# Patient Record
Sex: Female | Born: 1945 | Race: Black or African American | Hispanic: No | Smoking: Former smoker
Health system: Southern US, Community
[De-identification: ages and names within clinical notes are randomized; demographics above are authoritative.]

## PROBLEM LIST (undated history)

## (undated) DIAGNOSIS — J449 Chronic obstructive pulmonary disease, unspecified: Secondary | ICD-10-CM

## (undated) DIAGNOSIS — J984 Other disorders of lung: Secondary | ICD-10-CM

## (undated) DIAGNOSIS — H8110 Benign paroxysmal vertigo, unspecified ear: Secondary | ICD-10-CM

## (undated) DIAGNOSIS — M1990 Unspecified osteoarthritis, unspecified site: Secondary | ICD-10-CM

## (undated) DIAGNOSIS — C801 Malignant (primary) neoplasm, unspecified: Secondary | ICD-10-CM

## (undated) DIAGNOSIS — F419 Anxiety disorder, unspecified: Secondary | ICD-10-CM

## (undated) DIAGNOSIS — K589 Irritable bowel syndrome without diarrhea: Secondary | ICD-10-CM

## (undated) DIAGNOSIS — E785 Hyperlipidemia, unspecified: Secondary | ICD-10-CM

## (undated) DIAGNOSIS — R931 Abnormal findings on diagnostic imaging of heart and coronary circulation: Secondary | ICD-10-CM

## (undated) DIAGNOSIS — E119 Type 2 diabetes mellitus without complications: Secondary | ICD-10-CM

## (undated) DIAGNOSIS — F32A Depression, unspecified: Secondary | ICD-10-CM

## (undated) DIAGNOSIS — E66813 Obesity, class 3: Secondary | ICD-10-CM

## (undated) DIAGNOSIS — G4733 Obstructive sleep apnea (adult) (pediatric): Secondary | ICD-10-CM

## (undated) DIAGNOSIS — K219 Gastro-esophageal reflux disease without esophagitis: Secondary | ICD-10-CM

## (undated) DIAGNOSIS — F329 Major depressive disorder, single episode, unspecified: Secondary | ICD-10-CM

## (undated) DIAGNOSIS — I10 Essential (primary) hypertension: Secondary | ICD-10-CM

## (undated) DIAGNOSIS — H269 Unspecified cataract: Secondary | ICD-10-CM

## (undated) HISTORY — DX: Irritable bowel syndrome, unspecified: K58.9

## (undated) HISTORY — PX: PARTIAL HYSTERECTOMY: SHX80

## (undated) HISTORY — PX: ABDOMINAL HYSTERECTOMY: SHX81

## (undated) HISTORY — DX: Other disorders of lung: J98.4

## (undated) HISTORY — DX: Obstructive sleep apnea (adult) (pediatric): G47.33

## (undated) HISTORY — DX: Anxiety disorder, unspecified: F41.9

## (undated) HISTORY — DX: Obesity, class 3: E66.813

## (undated) HISTORY — DX: Hyperlipidemia, unspecified: E78.5

## (undated) HISTORY — DX: Unspecified cataract: H26.9

## (undated) HISTORY — PX: ROTATOR CUFF REPAIR: SHX139

## (undated) HISTORY — DX: Major depressive disorder, single episode, unspecified: F32.9

## (undated) HISTORY — PX: TONSILLECTOMY: SHX5217

## (undated) HISTORY — DX: Abnormal findings on diagnostic imaging of heart and coronary circulation: R93.1

## (undated) HISTORY — DX: Depression, unspecified: F32.A

## (undated) HISTORY — DX: Essential (primary) hypertension: I10

## (undated) HISTORY — DX: Morbid (severe) obesity due to excess calories: E66.01

## (undated) HISTORY — DX: Benign paroxysmal vertigo, unspecified ear: H81.10

## (undated) HISTORY — PX: LIVER BIOPSY: SHX301

## (undated) HISTORY — DX: Unspecified osteoarthritis, unspecified site: M19.90

## (undated) HISTORY — DX: Chronic obstructive pulmonary disease, unspecified: J44.9

---

## 1998-01-05 ENCOUNTER — Encounter: Admission: RE | Admit: 1998-01-05 | Discharge: 1998-01-05 | Payer: Self-pay | Admitting: Family Medicine

## 1998-01-19 ENCOUNTER — Encounter: Admission: RE | Admit: 1998-01-19 | Discharge: 1998-01-19 | Payer: Self-pay | Admitting: Family Medicine

## 1998-06-28 ENCOUNTER — Encounter: Admission: RE | Admit: 1998-06-28 | Discharge: 1998-06-28 | Payer: Self-pay | Admitting: Family Medicine

## 1998-07-08 ENCOUNTER — Encounter (INDEPENDENT_AMBULATORY_CARE_PROVIDER_SITE_OTHER): Payer: Self-pay | Admitting: *Deleted

## 1998-07-08 LAB — CONVERTED CEMR LAB

## 1998-07-17 ENCOUNTER — Encounter: Admission: RE | Admit: 1998-07-17 | Discharge: 1998-07-17 | Payer: Self-pay | Admitting: Family Medicine

## 1998-08-16 ENCOUNTER — Encounter: Admission: RE | Admit: 1998-08-16 | Discharge: 1998-08-16 | Payer: Self-pay | Admitting: Family Medicine

## 1998-08-17 ENCOUNTER — Encounter: Admission: RE | Admit: 1998-08-17 | Discharge: 1998-08-17 | Payer: Self-pay | Admitting: Family Medicine

## 1998-08-22 ENCOUNTER — Encounter: Admission: RE | Admit: 1998-08-22 | Discharge: 1998-11-20 | Payer: Self-pay | Admitting: *Deleted

## 1998-09-18 ENCOUNTER — Encounter: Admission: RE | Admit: 1998-09-18 | Discharge: 1998-09-18 | Payer: Self-pay | Admitting: Family Medicine

## 1998-09-25 ENCOUNTER — Encounter: Admission: RE | Admit: 1998-09-25 | Discharge: 1998-09-25 | Payer: Self-pay | Admitting: Family Medicine

## 1998-10-30 ENCOUNTER — Encounter: Admission: RE | Admit: 1998-10-30 | Discharge: 1998-10-30 | Payer: Self-pay | Admitting: Family Medicine

## 1998-11-07 ENCOUNTER — Encounter: Admission: RE | Admit: 1998-11-07 | Discharge: 1998-11-07 | Payer: Self-pay | Admitting: Family Medicine

## 1999-04-24 ENCOUNTER — Encounter: Admission: RE | Admit: 1999-04-24 | Discharge: 1999-04-24 | Payer: Self-pay | Admitting: Family Medicine

## 2000-01-10 ENCOUNTER — Encounter: Admission: RE | Admit: 2000-01-10 | Discharge: 2000-01-10 | Payer: Self-pay | Admitting: Family Medicine

## 2000-05-22 ENCOUNTER — Encounter: Admission: RE | Admit: 2000-05-22 | Discharge: 2000-05-22 | Payer: Self-pay | Admitting: Family Medicine

## 2000-06-26 ENCOUNTER — Encounter: Payer: Self-pay | Admitting: Sports Medicine

## 2000-06-26 ENCOUNTER — Encounter: Admission: RE | Admit: 2000-06-26 | Discharge: 2000-06-26 | Payer: Self-pay | Admitting: Sports Medicine

## 2001-01-21 ENCOUNTER — Encounter: Admission: RE | Admit: 2001-01-21 | Discharge: 2001-01-21 | Payer: Self-pay | Admitting: Family Medicine

## 2001-03-09 ENCOUNTER — Encounter: Admission: RE | Admit: 2001-03-09 | Discharge: 2001-03-09 | Payer: Self-pay | Admitting: Family Medicine

## 2001-07-16 ENCOUNTER — Encounter: Admission: RE | Admit: 2001-07-16 | Discharge: 2001-07-16 | Payer: Self-pay | Admitting: Family Medicine

## 2002-07-08 ENCOUNTER — Encounter: Admission: RE | Admit: 2002-07-08 | Discharge: 2002-07-08 | Payer: Self-pay | Admitting: Family Medicine

## 2002-12-09 ENCOUNTER — Encounter: Admission: RE | Admit: 2002-12-09 | Discharge: 2002-12-09 | Payer: Self-pay | Admitting: Family Medicine

## 2003-02-26 ENCOUNTER — Emergency Department (HOSPITAL_COMMUNITY): Admission: EM | Admit: 2003-02-26 | Discharge: 2003-02-26 | Payer: Self-pay | Admitting: Emergency Medicine

## 2003-03-09 ENCOUNTER — Encounter: Admission: RE | Admit: 2003-03-09 | Discharge: 2003-03-09 | Payer: Self-pay | Admitting: Family Medicine

## 2003-03-25 ENCOUNTER — Encounter: Admission: RE | Admit: 2003-03-25 | Discharge: 2003-03-25 | Payer: Self-pay | Admitting: Sports Medicine

## 2003-04-22 ENCOUNTER — Encounter: Admission: RE | Admit: 2003-04-22 | Discharge: 2003-04-22 | Payer: Self-pay | Admitting: Family Medicine

## 2003-07-06 ENCOUNTER — Encounter: Admission: RE | Admit: 2003-07-06 | Discharge: 2003-07-06 | Payer: Self-pay | Admitting: Family Medicine

## 2003-08-24 ENCOUNTER — Encounter: Admission: RE | Admit: 2003-08-24 | Discharge: 2003-08-24 | Payer: Self-pay | Admitting: Family Medicine

## 2003-08-25 ENCOUNTER — Encounter: Admission: RE | Admit: 2003-08-25 | Discharge: 2003-08-25 | Payer: Self-pay | Admitting: Sports Medicine

## 2003-08-29 ENCOUNTER — Encounter: Admission: RE | Admit: 2003-08-29 | Discharge: 2003-08-29 | Payer: Self-pay | Admitting: Family Medicine

## 2003-08-31 ENCOUNTER — Ambulatory Visit (HOSPITAL_COMMUNITY): Admission: RE | Admit: 2003-08-31 | Discharge: 2003-08-31 | Payer: Self-pay | Admitting: Sports Medicine

## 2003-09-01 ENCOUNTER — Ambulatory Visit (HOSPITAL_COMMUNITY): Admission: RE | Admit: 2003-09-01 | Discharge: 2003-09-01 | Payer: Self-pay | Admitting: Sports Medicine

## 2003-09-12 ENCOUNTER — Encounter: Admission: RE | Admit: 2003-09-12 | Discharge: 2003-09-12 | Payer: Self-pay | Admitting: Family Medicine

## 2003-10-24 ENCOUNTER — Encounter: Admission: RE | Admit: 2003-10-24 | Discharge: 2003-10-24 | Payer: Self-pay | Admitting: Family Medicine

## 2003-11-23 ENCOUNTER — Encounter: Admission: RE | Admit: 2003-11-23 | Discharge: 2003-11-23 | Payer: Self-pay | Admitting: Family Medicine

## 2004-01-09 ENCOUNTER — Ambulatory Visit: Payer: Self-pay | Admitting: Family Medicine

## 2004-02-23 ENCOUNTER — Ambulatory Visit (HOSPITAL_COMMUNITY): Admission: RE | Admit: 2004-02-23 | Discharge: 2004-02-23 | Payer: Self-pay | Admitting: Family Medicine

## 2004-02-23 ENCOUNTER — Ambulatory Visit: Payer: Self-pay | Admitting: Sports Medicine

## 2004-03-19 ENCOUNTER — Ambulatory Visit: Payer: Self-pay | Admitting: Sports Medicine

## 2004-04-19 ENCOUNTER — Ambulatory Visit (HOSPITAL_COMMUNITY): Admission: RE | Admit: 2004-04-19 | Discharge: 2004-04-19 | Payer: Self-pay | Admitting: Gastroenterology

## 2004-04-19 ENCOUNTER — Encounter (INDEPENDENT_AMBULATORY_CARE_PROVIDER_SITE_OTHER): Payer: Self-pay | Admitting: Specialist

## 2004-04-19 ENCOUNTER — Encounter: Payer: Self-pay | Admitting: Family Medicine

## 2004-06-11 ENCOUNTER — Ambulatory Visit: Payer: Self-pay | Admitting: Family Medicine

## 2004-06-13 ENCOUNTER — Encounter: Admission: RE | Admit: 2004-06-13 | Discharge: 2004-06-13 | Payer: Self-pay | Admitting: Family Medicine

## 2004-07-26 ENCOUNTER — Ambulatory Visit: Payer: Self-pay | Admitting: Sports Medicine

## 2004-08-22 ENCOUNTER — Ambulatory Visit: Payer: Self-pay | Admitting: Family Medicine

## 2004-11-15 ENCOUNTER — Ambulatory Visit: Payer: Self-pay | Admitting: Family Medicine

## 2004-11-20 ENCOUNTER — Ambulatory Visit (HOSPITAL_COMMUNITY): Admission: RE | Admit: 2004-11-20 | Discharge: 2004-11-20 | Payer: Self-pay | Admitting: Family Medicine

## 2004-12-31 ENCOUNTER — Ambulatory Visit: Payer: Self-pay | Admitting: Family Medicine

## 2005-01-15 ENCOUNTER — Ambulatory Visit: Payer: Self-pay | Admitting: Sports Medicine

## 2005-01-29 ENCOUNTER — Ambulatory Visit: Payer: Self-pay | Admitting: Family Medicine

## 2005-02-11 ENCOUNTER — Ambulatory Visit: Payer: Self-pay | Admitting: Sports Medicine

## 2005-03-22 ENCOUNTER — Ambulatory Visit: Payer: Self-pay | Admitting: Family Medicine

## 2005-05-20 ENCOUNTER — Ambulatory Visit: Payer: Self-pay | Admitting: Family Medicine

## 2005-06-06 ENCOUNTER — Ambulatory Visit: Payer: Self-pay | Admitting: Family Medicine

## 2005-06-20 ENCOUNTER — Ambulatory Visit: Payer: Self-pay | Admitting: Family Medicine

## 2005-06-21 ENCOUNTER — Ambulatory Visit: Payer: Self-pay | Admitting: Sports Medicine

## 2005-07-16 ENCOUNTER — Ambulatory Visit: Payer: Self-pay | Admitting: Sports Medicine

## 2005-07-30 ENCOUNTER — Ambulatory Visit: Payer: Self-pay | Admitting: Family Medicine

## 2005-08-14 ENCOUNTER — Ambulatory Visit: Payer: Self-pay | Admitting: Family Medicine

## 2005-09-18 ENCOUNTER — Ambulatory Visit: Payer: Self-pay | Admitting: Family Medicine

## 2005-09-23 ENCOUNTER — Encounter: Admission: RE | Admit: 2005-09-23 | Discharge: 2005-12-06 | Payer: Self-pay | Admitting: Sports Medicine

## 2005-11-26 ENCOUNTER — Ambulatory Visit (HOSPITAL_COMMUNITY): Admission: RE | Admit: 2005-11-26 | Discharge: 2005-11-26 | Payer: Self-pay | Admitting: Family Medicine

## 2005-12-10 ENCOUNTER — Ambulatory Visit: Payer: Self-pay | Admitting: Sports Medicine

## 2006-01-29 ENCOUNTER — Ambulatory Visit: Payer: Self-pay | Admitting: Family Medicine

## 2006-02-19 ENCOUNTER — Ambulatory Visit (HOSPITAL_BASED_OUTPATIENT_CLINIC_OR_DEPARTMENT_OTHER): Admission: RE | Admit: 2006-02-19 | Discharge: 2006-02-19 | Payer: Self-pay | Admitting: Orthopedic Surgery

## 2006-02-25 ENCOUNTER — Encounter: Admission: RE | Admit: 2006-02-25 | Discharge: 2006-05-15 | Payer: Self-pay | Admitting: Orthopedic Surgery

## 2006-03-14 ENCOUNTER — Ambulatory Visit: Payer: Self-pay | Admitting: Family Medicine

## 2006-03-28 ENCOUNTER — Ambulatory Visit: Payer: Self-pay | Admitting: Family Medicine

## 2006-04-14 ENCOUNTER — Encounter: Payer: Self-pay | Admitting: Family Medicine

## 2006-04-14 ENCOUNTER — Ambulatory Visit: Payer: Self-pay | Admitting: Sports Medicine

## 2006-04-14 LAB — CONVERTED CEMR LAB
BUN: 17 mg/dL (ref 6–23)
CO2: 21 meq/L (ref 19–32)
Calcium: 9.9 mg/dL (ref 8.4–10.5)
Chloride: 104 meq/L (ref 96–112)
Creatinine, Ser: 0.78 mg/dL (ref 0.40–1.20)
Glucose, Bld: 102 mg/dL — ABNORMAL HIGH (ref 70–99)
Potassium: 4.2 meq/L (ref 3.5–5.3)
Sodium: 139 meq/L (ref 135–145)

## 2006-05-14 ENCOUNTER — Ambulatory Visit: Payer: Self-pay | Admitting: Family Medicine

## 2006-06-05 DIAGNOSIS — M1990 Unspecified osteoarthritis, unspecified site: Secondary | ICD-10-CM | POA: Insufficient documentation

## 2006-06-05 DIAGNOSIS — I10 Essential (primary) hypertension: Secondary | ICD-10-CM | POA: Insufficient documentation

## 2006-06-05 DIAGNOSIS — F339 Major depressive disorder, recurrent, unspecified: Secondary | ICD-10-CM | POA: Insufficient documentation

## 2006-06-05 DIAGNOSIS — F419 Anxiety disorder, unspecified: Secondary | ICD-10-CM | POA: Insufficient documentation

## 2006-06-05 DIAGNOSIS — K589 Irritable bowel syndrome without diarrhea: Secondary | ICD-10-CM | POA: Insufficient documentation

## 2006-06-05 DIAGNOSIS — F411 Generalized anxiety disorder: Secondary | ICD-10-CM | POA: Insufficient documentation

## 2006-06-05 DIAGNOSIS — Z720 Tobacco use: Secondary | ICD-10-CM | POA: Insufficient documentation

## 2006-06-05 DIAGNOSIS — J309 Allergic rhinitis, unspecified: Secondary | ICD-10-CM | POA: Insufficient documentation

## 2006-06-06 ENCOUNTER — Encounter (INDEPENDENT_AMBULATORY_CARE_PROVIDER_SITE_OTHER): Payer: Self-pay | Admitting: *Deleted

## 2006-07-24 ENCOUNTER — Encounter: Payer: Self-pay | Admitting: Family Medicine

## 2006-07-24 ENCOUNTER — Ambulatory Visit: Payer: Self-pay | Admitting: Family Medicine

## 2006-07-24 LAB — CONVERTED CEMR LAB: Hgb A1c MFr Bld: 6.2 %

## 2006-07-30 ENCOUNTER — Ambulatory Visit (HOSPITAL_BASED_OUTPATIENT_CLINIC_OR_DEPARTMENT_OTHER): Admission: RE | Admit: 2006-07-30 | Discharge: 2006-07-30 | Payer: Self-pay | Admitting: Orthopedic Surgery

## 2006-07-30 LAB — CONVERTED CEMR LAB: Rhuematoid fact SerPl-aCnc: 23 intl units/mL — ABNORMAL HIGH (ref 0–20)

## 2006-08-05 ENCOUNTER — Telehealth: Payer: Self-pay | Admitting: *Deleted

## 2006-08-21 ENCOUNTER — Encounter: Admission: RE | Admit: 2006-08-21 | Discharge: 2006-11-19 | Payer: Self-pay | Admitting: Orthopedic Surgery

## 2006-08-22 ENCOUNTER — Encounter: Payer: Self-pay | Admitting: Family Medicine

## 2006-08-22 ENCOUNTER — Ambulatory Visit: Payer: Self-pay | Admitting: Family Medicine

## 2006-08-22 LAB — CONVERTED CEMR LAB
BUN: 13 mg/dL (ref 6–23)
CO2: 23 meq/L (ref 19–32)
Calcium: 9.8 mg/dL (ref 8.4–10.5)
Chloride: 102 meq/L (ref 96–112)
Creatinine, Ser: 0.74 mg/dL (ref 0.40–1.20)
Glucose, Bld: 87 mg/dL (ref 70–99)
Potassium: 4.3 meq/L (ref 3.5–5.3)
Sodium: 142 meq/L (ref 135–145)

## 2006-09-18 ENCOUNTER — Encounter: Payer: Self-pay | Admitting: Family Medicine

## 2006-09-19 ENCOUNTER — Ambulatory Visit: Payer: Self-pay | Admitting: Family Medicine

## 2006-11-03 ENCOUNTER — Encounter: Payer: Self-pay | Admitting: Family Medicine

## 2006-11-18 ENCOUNTER — Ambulatory Visit: Payer: Self-pay | Admitting: Family Medicine

## 2006-11-18 LAB — CONVERTED CEMR LAB: Hgb A1c MFr Bld: 6.2 %

## 2006-11-24 ENCOUNTER — Telehealth: Payer: Self-pay | Admitting: Family Medicine

## 2006-11-28 ENCOUNTER — Ambulatory Visit (HOSPITAL_COMMUNITY): Admission: RE | Admit: 2006-11-28 | Discharge: 2006-11-28 | Payer: Self-pay | Admitting: Family Medicine

## 2006-12-22 ENCOUNTER — Telehealth: Payer: Self-pay | Admitting: *Deleted

## 2006-12-24 ENCOUNTER — Encounter: Payer: Self-pay | Admitting: Family Medicine

## 2006-12-24 ENCOUNTER — Ambulatory Visit: Payer: Self-pay | Admitting: Family Medicine

## 2006-12-24 DIAGNOSIS — M109 Gout, unspecified: Secondary | ICD-10-CM | POA: Insufficient documentation

## 2006-12-24 LAB — CONVERTED CEMR LAB
BUN: 20 mg/dL (ref 6–23)
CO2: 22 meq/L (ref 19–32)
Calcium: 9.6 mg/dL (ref 8.4–10.5)
Chloride: 107 meq/L (ref 96–112)
Creatinine, Ser: 0.69 mg/dL (ref 0.40–1.20)
Glucose, Bld: 107 mg/dL — ABNORMAL HIGH (ref 70–99)
Potassium: 4.4 meq/L (ref 3.5–5.3)
Sodium: 143 meq/L (ref 135–145)

## 2006-12-25 ENCOUNTER — Telehealth: Payer: Self-pay | Admitting: Family Medicine

## 2007-01-21 ENCOUNTER — Ambulatory Visit: Payer: Self-pay | Admitting: Family Medicine

## 2007-01-23 ENCOUNTER — Ambulatory Visit: Payer: Self-pay | Admitting: Internal Medicine

## 2007-01-26 ENCOUNTER — Ambulatory Visit: Payer: Self-pay | Admitting: Family Medicine

## 2007-01-28 ENCOUNTER — Ambulatory Visit: Payer: Self-pay | Admitting: Family Medicine

## 2007-01-30 ENCOUNTER — Ambulatory Visit: Payer: Self-pay | Admitting: Family Medicine

## 2007-02-02 ENCOUNTER — Telehealth: Payer: Self-pay | Admitting: Family Medicine

## 2007-02-04 ENCOUNTER — Ambulatory Visit: Payer: Self-pay | Admitting: Family Medicine

## 2007-02-06 ENCOUNTER — Ambulatory Visit: Payer: Self-pay | Admitting: Family Medicine

## 2007-02-24 ENCOUNTER — Ambulatory Visit: Payer: Self-pay | Admitting: Family Medicine

## 2007-03-09 ENCOUNTER — Telehealth: Payer: Self-pay | Admitting: Family Medicine

## 2007-03-11 ENCOUNTER — Encounter: Payer: Self-pay | Admitting: Family Medicine

## 2007-04-22 ENCOUNTER — Telehealth: Payer: Self-pay | Admitting: *Deleted

## 2007-04-24 ENCOUNTER — Telehealth: Payer: Self-pay | Admitting: *Deleted

## 2007-04-30 ENCOUNTER — Telehealth: Payer: Self-pay | Admitting: *Deleted

## 2007-05-20 ENCOUNTER — Telehealth (INDEPENDENT_AMBULATORY_CARE_PROVIDER_SITE_OTHER): Payer: Self-pay | Admitting: *Deleted

## 2007-05-29 ENCOUNTER — Ambulatory Visit: Payer: Self-pay | Admitting: Family Medicine

## 2007-05-29 ENCOUNTER — Encounter: Payer: Self-pay | Admitting: Family Medicine

## 2007-05-29 LAB — CONVERTED CEMR LAB
BUN: 11 mg/dL (ref 6–23)
CO2: 26 meq/L (ref 19–32)
Calcium: 9.7 mg/dL (ref 8.4–10.5)
Chloride: 100 meq/L (ref 96–112)
Creatinine, Ser: 0.72 mg/dL (ref 0.40–1.20)
Glucose, Bld: 133 mg/dL — ABNORMAL HIGH (ref 70–99)
Hgb A1c MFr Bld: 7.1 %
Potassium: 4.6 meq/L (ref 3.5–5.3)
Sodium: 140 meq/L (ref 135–145)

## 2007-06-05 ENCOUNTER — Telehealth: Payer: Self-pay | Admitting: *Deleted

## 2007-06-09 ENCOUNTER — Telehealth: Payer: Self-pay | Admitting: *Deleted

## 2007-06-10 ENCOUNTER — Encounter: Payer: Self-pay | Admitting: Family Medicine

## 2007-06-11 ENCOUNTER — Encounter: Payer: Self-pay | Admitting: Family Medicine

## 2007-06-22 ENCOUNTER — Encounter: Payer: Self-pay | Admitting: Family Medicine

## 2007-07-13 ENCOUNTER — Ambulatory Visit: Payer: Self-pay | Admitting: Family Medicine

## 2007-07-20 ENCOUNTER — Encounter: Payer: Self-pay | Admitting: Family Medicine

## 2007-09-03 ENCOUNTER — Ambulatory Visit: Payer: Self-pay | Admitting: Family Medicine

## 2007-09-03 DIAGNOSIS — M5430 Sciatica, unspecified side: Secondary | ICD-10-CM | POA: Insufficient documentation

## 2007-10-01 ENCOUNTER — Telehealth: Payer: Self-pay | Admitting: Family Medicine

## 2007-10-14 ENCOUNTER — Ambulatory Visit: Payer: Self-pay | Admitting: Family Medicine

## 2007-10-20 ENCOUNTER — Encounter: Payer: Self-pay | Admitting: Family Medicine

## 2007-10-20 ENCOUNTER — Ambulatory Visit: Payer: Self-pay | Admitting: Family Medicine

## 2007-10-20 LAB — CONVERTED CEMR LAB: Hgb A1c MFr Bld: 7.1 %

## 2007-10-26 LAB — CONVERTED CEMR LAB
Cholesterol: 198 mg/dL (ref 0–200)
HDL: 30 mg/dL — ABNORMAL LOW (ref >39)
Total CHOL/HDL Ratio: 6.6
Triglycerides: 414 mg/dL — ABNORMAL HIGH (ref <150)

## 2007-11-16 ENCOUNTER — Encounter: Payer: Self-pay | Admitting: Family Medicine

## 2007-11-24 ENCOUNTER — Emergency Department (HOSPITAL_COMMUNITY): Admission: EM | Admit: 2007-11-24 | Discharge: 2007-11-24 | Payer: Self-pay | Admitting: Emergency Medicine

## 2007-11-25 ENCOUNTER — Ambulatory Visit: Payer: Self-pay | Admitting: Family Medicine

## 2007-11-25 DIAGNOSIS — E785 Hyperlipidemia, unspecified: Secondary | ICD-10-CM | POA: Insufficient documentation

## 2007-11-27 ENCOUNTER — Encounter: Payer: Self-pay | Admitting: Family Medicine

## 2007-11-27 ENCOUNTER — Ambulatory Visit: Payer: Self-pay | Admitting: Family Medicine

## 2007-11-30 ENCOUNTER — Ambulatory Visit (HOSPITAL_COMMUNITY): Admission: RE | Admit: 2007-11-30 | Discharge: 2007-11-30 | Payer: Self-pay | Admitting: Family Medicine

## 2007-12-08 LAB — CONVERTED CEMR LAB
Direct LDL: 106 mg/dL — ABNORMAL HIGH
TSH: 0.822 microintl units/mL (ref 0.350–4.50)

## 2007-12-28 ENCOUNTER — Ambulatory Visit: Payer: Self-pay | Admitting: Family Medicine

## 2008-03-08 ENCOUNTER — Ambulatory Visit: Payer: Self-pay | Admitting: Family Medicine

## 2008-03-08 LAB — CONVERTED CEMR LAB: Hgb A1c MFr Bld: 7.7 %

## 2008-03-09 ENCOUNTER — Telehealth: Payer: Self-pay | Admitting: Family Medicine

## 2008-04-15 ENCOUNTER — Encounter: Payer: Self-pay | Admitting: Family Medicine

## 2008-04-29 ENCOUNTER — Encounter: Payer: Self-pay | Admitting: Family Medicine

## 2008-05-12 ENCOUNTER — Ambulatory Visit: Payer: Self-pay | Admitting: Family Medicine

## 2008-09-02 ENCOUNTER — Ambulatory Visit: Payer: Self-pay | Admitting: Family Medicine

## 2008-09-02 DIAGNOSIS — M25559 Pain in unspecified hip: Secondary | ICD-10-CM | POA: Insufficient documentation

## 2008-09-02 LAB — CONVERTED CEMR LAB: Hgb A1c MFr Bld: 6.9 %

## 2008-09-08 ENCOUNTER — Ambulatory Visit: Payer: Self-pay | Admitting: Family Medicine

## 2008-09-08 ENCOUNTER — Encounter: Payer: Self-pay | Admitting: Family Medicine

## 2008-09-08 LAB — CONVERTED CEMR LAB
ALT: 17 units/L (ref 0–35)
AST: 17 units/L (ref 0–37)
Albumin: 4.3 g/dL (ref 3.5–5.2)
Alkaline Phosphatase: 105 units/L (ref 39–117)
BUN: 20 mg/dL (ref 6–23)
CO2: 24 meq/L (ref 19–32)
Calcium: 9.7 mg/dL (ref 8.4–10.5)
Chloride: 101 meq/L (ref 96–112)
Creatinine, Ser: 0.75 mg/dL (ref 0.40–1.20)
Direct LDL: 79 mg/dL
Glucose, Bld: 127 mg/dL — ABNORMAL HIGH (ref 70–99)
Potassium: 4.1 meq/L (ref 3.5–5.3)
Sodium: 138 meq/L (ref 135–145)
Total Bilirubin: 0.3 mg/dL (ref 0.3–1.2)
Total Protein: 7.5 g/dL (ref 6.0–8.3)

## 2008-09-09 ENCOUNTER — Encounter: Payer: Self-pay | Admitting: Family Medicine

## 2008-10-04 ENCOUNTER — Ambulatory Visit: Payer: Self-pay | Admitting: Family Medicine

## 2008-11-11 ENCOUNTER — Ambulatory Visit: Payer: Self-pay | Admitting: Family Medicine

## 2008-11-11 DIAGNOSIS — J984 Other disorders of lung: Secondary | ICD-10-CM | POA: Insufficient documentation

## 2008-11-11 LAB — CONVERTED CEMR LAB: Hgb A1c MFr Bld: 7 %

## 2008-11-17 ENCOUNTER — Encounter: Payer: Self-pay | Admitting: Family Medicine

## 2008-11-23 ENCOUNTER — Encounter: Payer: Self-pay | Admitting: Family Medicine

## 2008-11-23 ENCOUNTER — Ambulatory Visit: Payer: Self-pay | Admitting: Family Medicine

## 2008-11-24 LAB — CONVERTED CEMR LAB
ALT: 14 units/L (ref 0–35)
AST: 14 units/L (ref 0–37)
Albumin: 4.1 g/dL (ref 3.5–5.2)
Alkaline Phosphatase: 103 units/L (ref 39–117)
BUN: 18 mg/dL (ref 6–23)
CO2: 22 meq/L (ref 19–32)
Calcium: 9.5 mg/dL (ref 8.4–10.5)
Chloride: 102 meq/L (ref 96–112)
Creatinine, Ser: 0.84 mg/dL (ref 0.40–1.20)
Direct LDL: 88 mg/dL
Glucose, Bld: 130 mg/dL — ABNORMAL HIGH (ref 70–99)
Potassium: 4.4 meq/L (ref 3.5–5.3)
Sodium: 140 meq/L (ref 135–145)
Total Bilirubin: 0.4 mg/dL (ref 0.3–1.2)
Total Protein: 7.3 g/dL (ref 6.0–8.3)
Uric Acid, Serum: 8.7 mg/dL — ABNORMAL HIGH (ref 2.4–7.0)

## 2008-12-30 ENCOUNTER — Ambulatory Visit (HOSPITAL_COMMUNITY): Admission: RE | Admit: 2008-12-30 | Discharge: 2008-12-30 | Payer: Self-pay | Admitting: Family Medicine

## 2009-01-04 ENCOUNTER — Ambulatory Visit: Payer: Self-pay | Admitting: Family Medicine

## 2009-01-05 ENCOUNTER — Telehealth: Payer: Self-pay | Admitting: Family Medicine

## 2009-01-06 DIAGNOSIS — R931 Abnormal findings on diagnostic imaging of heart and coronary circulation: Secondary | ICD-10-CM

## 2009-01-06 HISTORY — DX: Abnormal findings on diagnostic imaging of heart and coronary circulation: R93.1

## 2009-01-12 ENCOUNTER — Ambulatory Visit: Payer: Self-pay | Admitting: Family Medicine

## 2009-01-12 ENCOUNTER — Inpatient Hospital Stay (HOSPITAL_COMMUNITY): Admission: AD | Admit: 2009-01-12 | Discharge: 2009-01-13 | Payer: Self-pay | Admitting: Family Medicine

## 2009-01-12 ENCOUNTER — Encounter: Payer: Self-pay | Admitting: Family Medicine

## 2009-01-12 ENCOUNTER — Ambulatory Visit (HOSPITAL_COMMUNITY): Admission: RE | Admit: 2009-01-12 | Discharge: 2009-01-12 | Payer: Self-pay | Admitting: Family Medicine

## 2009-01-12 DIAGNOSIS — R079 Chest pain, unspecified: Secondary | ICD-10-CM | POA: Insufficient documentation

## 2009-01-18 ENCOUNTER — Encounter: Payer: Self-pay | Admitting: Family Medicine

## 2009-01-31 ENCOUNTER — Ambulatory Visit: Payer: Self-pay | Admitting: Family Medicine

## 2009-02-17 ENCOUNTER — Telehealth: Payer: Self-pay | Admitting: Family Medicine

## 2009-02-20 ENCOUNTER — Telehealth: Payer: Self-pay | Admitting: Family Medicine

## 2009-02-21 ENCOUNTER — Encounter: Payer: Self-pay | Admitting: Family Medicine

## 2009-02-27 ENCOUNTER — Ambulatory Visit: Payer: Self-pay | Admitting: Internal Medicine

## 2009-03-09 ENCOUNTER — Telehealth (INDEPENDENT_AMBULATORY_CARE_PROVIDER_SITE_OTHER): Payer: Self-pay

## 2009-03-13 ENCOUNTER — Ambulatory Visit: Payer: Self-pay | Admitting: Cardiology

## 2009-03-13 ENCOUNTER — Encounter (HOSPITAL_COMMUNITY): Admission: RE | Admit: 2009-03-13 | Discharge: 2009-04-07 | Payer: Self-pay | Admitting: Internal Medicine

## 2009-03-13 ENCOUNTER — Ambulatory Visit: Payer: Self-pay | Admitting: Internal Medicine

## 2009-03-13 ENCOUNTER — Ambulatory Visit: Payer: Self-pay

## 2009-03-14 ENCOUNTER — Encounter: Payer: Self-pay | Admitting: Cardiology

## 2009-03-15 LAB — CONVERTED CEMR LAB
Cholesterol: 160 mg/dL (ref 0–200)
Direct LDL: 82.4 mg/dL
HDL: 26.9 mg/dL — ABNORMAL LOW (ref >39.00)
Total CHOL/HDL Ratio: 6
Triglycerides: 359 mg/dL — ABNORMAL HIGH (ref 0.0–149.0)
VLDL: 71.8 mg/dL — ABNORMAL HIGH (ref 0.0–40.0)

## 2009-03-20 ENCOUNTER — Encounter: Payer: Self-pay | Admitting: Internal Medicine

## 2009-03-20 ENCOUNTER — Ambulatory Visit (HOSPITAL_BASED_OUTPATIENT_CLINIC_OR_DEPARTMENT_OTHER): Admission: RE | Admit: 2009-03-20 | Discharge: 2009-03-20 | Payer: Self-pay | Admitting: Internal Medicine

## 2009-03-28 ENCOUNTER — Ambulatory Visit: Payer: Self-pay | Admitting: Family Medicine

## 2009-04-03 ENCOUNTER — Ambulatory Visit: Payer: Self-pay | Admitting: Pulmonary Disease

## 2009-04-13 ENCOUNTER — Telehealth: Payer: Self-pay | Admitting: Internal Medicine

## 2009-04-26 ENCOUNTER — Encounter: Payer: Self-pay | Admitting: Family Medicine

## 2009-04-26 ENCOUNTER — Ambulatory Visit: Payer: Self-pay | Admitting: Family Medicine

## 2009-04-26 DIAGNOSIS — E669 Obesity, unspecified: Secondary | ICD-10-CM | POA: Insufficient documentation

## 2009-04-26 LAB — CONVERTED CEMR LAB: Hgb A1c MFr Bld: 6.4 %

## 2009-04-27 ENCOUNTER — Ambulatory Visit: Payer: Self-pay | Admitting: Pulmonary Disease

## 2009-04-27 DIAGNOSIS — G4733 Obstructive sleep apnea (adult) (pediatric): Secondary | ICD-10-CM | POA: Insufficient documentation

## 2009-04-28 LAB — CONVERTED CEMR LAB
ALT: 16 units/L (ref 0–35)
AST: 15 units/L (ref 0–37)
Albumin: 4.4 g/dL (ref 3.5–5.2)
Alkaline Phosphatase: 101 units/L (ref 39–117)
BUN: 21 mg/dL (ref 6–23)
CO2: 23 meq/L (ref 19–32)
Calcium: 10 mg/dL (ref 8.4–10.5)
Chloride: 103 meq/L (ref 96–112)
Creatinine, Ser: 0.78 mg/dL (ref 0.40–1.20)
Glucose, Bld: 120 mg/dL — ABNORMAL HIGH (ref 70–99)
Potassium: 4.4 meq/L (ref 3.5–5.3)
Sodium: 139 meq/L (ref 135–145)
Total Bilirubin: 0.3 mg/dL (ref 0.3–1.2)
Total Protein: 7.7 g/dL (ref 6.0–8.3)
Uric Acid, Serum: 7.1 mg/dL — ABNORMAL HIGH (ref 2.4–7.0)

## 2009-05-02 ENCOUNTER — Ambulatory Visit: Payer: Self-pay | Admitting: Family Medicine

## 2009-05-09 ENCOUNTER — Telehealth: Payer: Self-pay | Admitting: Pulmonary Disease

## 2009-05-12 ENCOUNTER — Telehealth (INDEPENDENT_AMBULATORY_CARE_PROVIDER_SITE_OTHER): Payer: Self-pay | Admitting: Pharmacist

## 2009-06-02 ENCOUNTER — Telehealth: Payer: Self-pay | Admitting: Family Medicine

## 2009-06-07 ENCOUNTER — Ambulatory Visit: Payer: Self-pay | Admitting: Pulmonary Disease

## 2009-06-12 ENCOUNTER — Ambulatory Visit: Payer: Self-pay | Admitting: Family Medicine

## 2009-06-19 ENCOUNTER — Ambulatory Visit: Payer: Self-pay | Admitting: Family Medicine

## 2009-06-29 ENCOUNTER — Encounter: Payer: Self-pay | Admitting: Pulmonary Disease

## 2009-07-13 ENCOUNTER — Ambulatory Visit: Payer: Self-pay | Admitting: Family Medicine

## 2009-08-04 ENCOUNTER — Ambulatory Visit: Payer: Self-pay | Admitting: Internal Medicine

## 2009-09-14 ENCOUNTER — Encounter: Payer: Self-pay | Admitting: Family Medicine

## 2009-09-15 ENCOUNTER — Encounter: Payer: Self-pay | Admitting: Family Medicine

## 2009-09-15 ENCOUNTER — Ambulatory Visit: Payer: Self-pay | Admitting: Family Medicine

## 2009-09-15 LAB — CONVERTED CEMR LAB
ALT: 14 units/L (ref 0–35)
AST: 13 units/L (ref 0–37)
Albumin: 4 g/dL (ref 3.5–5.2)
Alkaline Phosphatase: 110 units/L (ref 39–117)
BUN: 22 mg/dL (ref 6–23)
CO2: 24 meq/L (ref 19–32)
Calcium: 9.7 mg/dL (ref 8.4–10.5)
Chloride: 102 meq/L (ref 96–112)
Creatinine, Ser: 0.82 mg/dL (ref 0.40–1.20)
Direct LDL: 63 mg/dL
Glucose, Bld: 142 mg/dL — ABNORMAL HIGH (ref 70–99)
Hgb A1c MFr Bld: 6.8 %
Potassium: 4.3 meq/L (ref 3.5–5.3)
Sodium: 138 meq/L (ref 135–145)
Total Bilirubin: 0.4 mg/dL (ref 0.3–1.2)
Total Protein: 7.1 g/dL (ref 6.0–8.3)
Uric Acid, Serum: 8.1 mg/dL — ABNORMAL HIGH (ref 2.4–7.0)

## 2009-09-18 ENCOUNTER — Encounter: Payer: Self-pay | Admitting: Family Medicine

## 2009-09-25 ENCOUNTER — Encounter: Payer: Self-pay | Admitting: Pulmonary Disease

## 2009-12-07 ENCOUNTER — Encounter: Payer: Self-pay | Admitting: Family Medicine

## 2009-12-19 ENCOUNTER — Ambulatory Visit: Payer: Self-pay | Admitting: Pulmonary Disease

## 2010-01-01 ENCOUNTER — Ambulatory Visit (HOSPITAL_COMMUNITY): Admission: RE | Admit: 2010-01-01 | Discharge: 2010-01-01 | Payer: Self-pay | Admitting: Family Medicine

## 2010-02-14 ENCOUNTER — Encounter: Payer: Self-pay | Admitting: Family Medicine

## 2010-02-14 ENCOUNTER — Ambulatory Visit: Payer: Self-pay | Admitting: Family Medicine

## 2010-02-14 LAB — CONVERTED CEMR LAB
ALT: 14 units/L (ref 0–35)
AST: 17 units/L (ref 0–37)
Albumin: 4.2 g/dL (ref 3.5–5.2)
Alkaline Phosphatase: 125 units/L — ABNORMAL HIGH (ref 39–117)
BUN: 19 mg/dL (ref 6–23)
CO2: 28 meq/L (ref 19–32)
Calcium: 10.2 mg/dL (ref 8.4–10.5)
Chloride: 97 meq/L (ref 96–112)
Creatinine, Ser: 0.76 mg/dL (ref 0.40–1.20)
Direct LDL: 80 mg/dL
Glucose, Bld: 139 mg/dL — ABNORMAL HIGH (ref 70–99)
Hgb A1c MFr Bld: 6.6 %
Potassium: 4.3 meq/L (ref 3.5–5.3)
Sodium: 137 meq/L (ref 135–145)
Total Bilirubin: 0.3 mg/dL (ref 0.3–1.2)
Total Protein: 7.4 g/dL (ref 6.0–8.3)

## 2010-04-13 ENCOUNTER — Ambulatory Visit
Admission: RE | Admit: 2010-04-13 | Discharge: 2010-04-13 | Payer: Self-pay | Source: Home / Self Care | Attending: Internal Medicine | Admitting: Internal Medicine

## 2010-04-13 ENCOUNTER — Encounter: Payer: Self-pay | Admitting: Internal Medicine

## 2010-05-04 ENCOUNTER — Ambulatory Visit: Admission: RE | Admit: 2010-05-04 | Discharge: 2010-05-04 | Payer: Self-pay | Source: Home / Self Care

## 2010-05-04 ENCOUNTER — Encounter: Payer: Self-pay | Admitting: Family Medicine

## 2010-05-07 LAB — CONVERTED CEMR LAB
Cholesterol: 132 mg/dL (ref 0–200)
HDL: 28 mg/dL — ABNORMAL LOW (ref >39)
LDL Cholesterol: 59 mg/dL (ref 0–99)
Total CHOL/HDL Ratio: 4.7
Triglycerides: 227 mg/dL — ABNORMAL HIGH (ref <150)
VLDL: 45 mg/dL — ABNORMAL HIGH (ref 0–40)

## 2010-05-08 NOTE — Consult Note (Signed)
Summary: Geralynn Rile  [facility]   Imported By: Bradly Bienenstock 11/19/2007 15:46:35  _____________________________________________________________________  External Attachment:    Type:   Image     Comment:   External Document

## 2010-05-08 NOTE — Assessment & Plan Note (Signed)
Summary: fu wp   Vital Signs:  Patient Profile:   65 Years Old Female Height:     63 inches Weight:      216.0 pounds BMI:     38.40 Temp:     97.4 degrees F oral Pulse rate:   71 / minute BP sitting:   130 / 75  (left arm) Cuff size:   large  Pt. in pain?   no  Vitals Entered By: Levert Feinstein LPN (May 13, 9447 9:24 AM)                  PCP:  [name]  MD  Chief Complaint:  f/u DM, HTN, and IBS.  History of Present Illness: 1. n/v/d Has had diarrhea, nausea and vomiting for the past couple of days. No fever. Some chills. Personal caregiver recently sick as well. Able to eat and drink. Urinating normally. States that she's going to increase her fiber intake.  2. diabetes Checking sugars daily. Numbers according to log ook are 100s to 170s with most readings in 110s to 130s. States that she's tolerating actos w/o difficulty. No n/v except recently and not related to sugar. Checks feet daily. No foot pain. Trims nails regularly.  3. HTN BP stable today. No problems with medications. Taking atenolol, enalapril, amlodipine, and HCTZ. No chest pain or discomfort. No SOB.   4. IBS States that she continues to have urgency to defecate and that this limits her activities outside the house occasionally. States that hyoscyamine helps but insurance no longer covers this medication. Originally prescribed by her GI doctor. Open to trying new medication.  5. Smoking Still smoking but less recently. Would like to stop. Has nicotine gum but has not committed to stopping yet. Does not want to do chantix.    Past Medical History:    Gout  274    DM type II    HTN    Osteoarthritis    IBS   Social History:    Divorced '97.  Lives alone, smokes.  No etoh.  Applying for disabilty through [employer] (ortho). Has trouble affording medications. Is trying to apply for 1st floor housing but will have to re-apply this year.      Physical Exam  General:     alert,  well-nourished, well-hydrated, and overweight-appearing.   Eyes:     sclerae clear, conjunctivae pink. Lungs:     Normal respiratory effort, chest expands symmetrically. Lungs are clear to auscultation, no crackles or wheezes. Heart:     RRR, nl S1/S2. Msk:     Foot exam: Nails well trimmed. No areas of trauma or skin breakdown. Pulses:     2+ DP and radial pulses. Extremities:     no LE edema.    Impression & Recommendations:  Problem # 1:  NAUSEA WITH VOMITING (ICD-787.01) Assessment: New Afebrile. Not ill-appearing. Hygeine and supportive care discussed. Encouraged increasing fiber. Orders: [place]- Est  Level 4 (67591)   Problem # 2:  DIABETES MELLITUS II, UNCOMPLICATED (MBW-466.59) Assessment: Unchanged A1C 7.7 at last check. Plan repeat at next visit. Daily log shows decent control. Foot care discussed. Her updated medication list for this problem includes:    Actos 30 Mg Tabs (Pioglitazone hcl) ..... One by mouth daily    Bayer Childrens Aspirin 81 Mg Chew (Aspirin) .[name]... Take 1 tablet by mouth once a day    Glucotrol Xl 10 Mg Tb24 (Glipizide) .[name]... Take 2 tablet by mouth once a day  Enalapril Maleate 20 Mg Tabs (Enalapril maleate) ..... One tablet by mouth twice a day  Orders: [place]- Est  Level 4 (88502)   Problem # 3:  HYPERTENSION, BENIGN SYSTEMIC (ICD-401.1) Assessment: Unchanged Near goal today. Better control than in past. Continue current regimen.  Her updated medication list for this problem includes:    Hydrochlorothiazide 25 Mg Tabs (Hydrochlorothiazide) .[name]... Take 1 tablet by mouth once a day    Atenolol 50 Mg Tabs (Atenolol) ..... One by mouth daily    Enalapril Maleate 20 Mg Tabs (Enalapril maleate) ..... One tablet by mouth twice a day    Amlodipine Besylate 10 Mg Tabs (Amlodipine besylate) ..... One by mouth daily  Orders: [place]- Est  Level 4 (99214)   Problem # 4:  IRRITABLE BOWEL SYNDROME (ICD-564.1) Assessment: Unchanged Will change to  dicyclomine to see if this helps with urgency, spasm symptoms. Pt agreeable. Unsure whether insurance will cover. If continue to be difficult for patient would have low threshhold for return to GI.  Orders: [place]- Est  Level 4 (99214)   Complete Medication List: 1)  Actos 30 Mg Tabs (Pioglitazone hcl) .... One by mouth daily 2)  Allopurinol 100 Mg Tabs (Allopurinol) .[name].. 1 tablet by mouth once a day 3)  Bayer Childrens Aspirin 81 Mg Chew (Aspirin) .... Take 1 tablet by mouth once a day 4)  Claritin 10 Mg Tabs (Loratadine) .... Take 1 tablet by mouth once a day 5)  Glucotrol Xl 10 Mg Tb24 (Glipizide) .... Take 2 tablet by mouth once a day 6)  Hydrochlorothiazide 25 Mg Tabs (Hydrochlorothiazide) .... Take 1 tablet by mouth once a day 7)  Trazamine 50 Mg Misc (Trazodone & diet manage prod) .[name].. 1 tab by mouth at bedtime prn 8)  Dicyclomine Hcl 20 Mg Tabs (Dicyclomine hcl) .... One by mouth four times daily 9)  Flonase 50 Mcg/act Susp (Fluticasone propionate) .[name].. 1 spray each nostril daily 10)  Sertraline Hcl 100 Mg Tabs (Sertraline hcl) .... Two tabs by mouth daily 11)  Atenolol 50 Mg Tabs (Atenolol) .... One by mouth daily 12)  Enalapril Maleate 20 Mg Tabs (Enalapril maleate) .... One tablet by mouth twice a day 13)  Tussionex Pennkinetic Er 8-10 Mg/49m Lqcr (Chlorpheniramine-hydrocodone) .... One teaspoon every 12 hours as needed for cough. dispense 60 ml or standard amount 14)  Flexeril 5 Mg Tabs (Cyclobenzaprine hcl) .... Take one by mouth at bedtime as needed for back pain 15)  Amlodipine Besylate 10 Mg Tabs (Amlodipine besylate) .... One by mouth daily 16)  Meclizine Hcl 25 Mg Tabs (Meclizine hcl) .... One by mouth 2-3 times a day as needed for vertigo 17)  Colchicine 0.6 Mg Tabs (Colchicine) .... One by mouth by mouth daily 18)  Ultram 50 Mg Tabs (Tramadol hcl) .... One by mouth every 6 hours   Patient Instructions: 1)  You're doing a good job keeping track of your blood pressure and  diabetes. We'll plan to check an A1C at your next visit. 2)  Continue to think about stopping smoking. 3)  follow-up here in 4 months or ealier if needed. 4)  Drink plenty of fluids and get rest. Wash your hands frequently. Increasing your fiber intake is a good idea.  5)  Nice to see you today.   Prescriptions: FLONASE 50 MCG/ACT  SUSP (FLUTICASONE PROPIONATE) 1 spray each nostril daily  #60 sprays x 2   Entered and Authorized by:   TEugenie Norrie MD   Signed by:  [name]  MD on 05/12/2008   Method used:   Faxed to ...       [facility] (retail)       [address] [place], [place]  [address]  [place]       Ph: [phone]       Fax: [phone]   RxID:   [id number] DICYCLOMINE HCL 20 MG TABS (DICYCLOMINE HCL) one by mouth four times daily  #120 x 2   Entered and Authorized by:   [name]  MD   Signed by:   [name]  MD on 05/12/2008   Method used:   Faxed to ...       [facility] (retail)       [address], [place]       [place], [place]  [address]  [place]       Ph: [phone]       Fax: [phone]   RxID:   [id number]

## 2010-05-08 NOTE — Assessment & Plan Note (Signed)
Summary: np6/ chestpain eval for stress test/ pt has preimer today opt...   Visit Type:  Follow-up Primary Bonnie Peterson:  Bonnie Norrie  MD  CC:  pt some 1/2 a pack a day-coughing and sob.  History of Present Illness: Patient is a 65 year old with a history of hypertension, diabetess, tobacco use.  She was recently admitted to [facility] on 10/7 with chest paine.  The spell lasted a almost 12 hrs.  Associated with shortness of breath.  Enzyms negative.  Echo with LVH, normal LV function.  Mild AS, mild MR. Since she has had some spells that are short lived, come and go with activity.  No episodes at rest or that wake her.  Current Medications (verified): 1)  Actos 30 Mg Tabs (Pioglitazone Hcl) .... One By Mouth Daily 2)  Allopurinol 200 Mg Tab .... Take 1 Tab By Mouth Daily For Gout 3)  Bayer Childrens Aspirin 81 Mg Chew (Aspirin) .... Take 1 Tablet By Mouth Once A Day 4)  Claritin 10 Mg Tabs (Loratadine) .... Take 1 Tablet By Mouth Once A Day 5)  Glucotrol Xl 10 Mg Tb24 (Glipizide) .... Take 2 Tablet By Mouth Once A Day 6)  Hydrochlorothiazide 25 Mg Tabs (Hydrochlorothiazide) .... Take 1 Tablet By Mouth Once A Day 7)  Trazamine 50 Mg  Misc (Trazodone & Diet Manage Prod) .[name].. 1 Tab By Mouth At Bedtime Prn 8)  Dicyclomine Hcl 20 Mg Tabs (Dicyclomine Hcl) .... One By Mouth Four Times Daily 9)  Flonase 50 Mcg/act  Susp (Fluticasone Propionate) .[name].. 1 Spray Each Nostril Daily 10)  Sertraline Hcl 100 Mg  Tabs (Sertraline Hcl) .... Two Tabs By Mouth Daily 11)  Atenolol 50 Mg  Tabs (Atenolol) .... One By Mouth Daily 12)  Enalapril Maleate 20 Mg  Tabs (Enalapril Maleate) .... One Tablet By Mouth Twice A Day 13)  Guaifenesin-Codeine 100-10 Mg/53m Liqd (Guaifenesin-Codeine) .... Take 1-2 Teaspoons Every 6 Hours As Needed For Cough; Disp 60 Cc 14)  Flexeril 5 Mg  Tabs (Cyclobenzaprine Hcl) .... Take One By Mouth At Bedtime As Needed For Back Pain 15)  Amlodipine Besylate 10 Mg  Tabs (Amlodipine  Besylate) .... One By Mouth Daily 16)  Ultram 50 Mg Tabs (Tramadol Hcl) .... One By Mouth Every 6 Hours 17)  Nitrostat 0.4 Mg Subl (Nitroglycerin) .... One By Mouth Sublingually As Needed For Chest Pain  Allergies (verified): No Known Drug Allergies  Past History:  Past Surgical History: Last updated: 01/31/2009 Hysterectomy--partial -, L rotator cuff repair 11/07-[facility] - 03/14/2006,  shoulder surgery w/ Dr. KJenean Lindau-,  tonsillectomy -  echo 10/10: mild pulm HTN, EF 60-65%, mild LVH, moderate aortic regurg  Family History: Last updated: 01/12/2009 Mom w/ DM, HTN.  Mom had MI in 2000.   Sister w/ DM.  No breast Ca.  Social History: Last updated: 11/11/2008 Divorced '97.  Lives alone, smokes.  No etoh.  Applying for disabilty through [employer](ortho). Has trouble affording medications. Recently moved to 1st floor apartment housing (end of June 2010). Likes it there.   Past Medical History: Gout  274 DM type II HTN Osteoarthritis IBS mild aortic regurg, mild MR.  Review of Systems       All systems reviewed.  Negative to the above problem except as noted.  Vital Signs:  Patient profile:   65year old female Height:      63 inches Weight:      218 pounds Pulse rate:   75 / minute BP  sitting:   153 / 71  (left arm) Cuff size:   large  Vitals Entered By: Lubertha Basque, CNA (February 27, 2009 2:44 PM)  Physical Exam  Additional Exam:  HEENT:  Normocephalic, atraumatic. EOMI, PERRLA.  Neck: JVP is normal. No thyromegaly. No bruits.  Lungs: clear to auscultation. No rales no wheezes.  Heart: Regular rate and rhythm. Normal S1, S2. No S3.   Gr II/VI systolic murmur  Abdomen:  Supple, nontender. Normal bowel sounds. No masses. No hepatomegaly.  Extremities:   Good distal pulses throughout. No lower extremity edema.  Musculoskeletal :moving all extremities.  Neuro:   alert and oriented x3.    EKG  Procedure date:  02/27/2009  Findings:      NSR. 61  bpm.  Impression & Recommendations:  Problem # 1:  CHEST PAIN (ICD-786.50) Chest pain is concerning with her risk factors.  She continues to smoke.   I would recommend a adenosine myoview to evaluate for inducible ischemia  Keep on same meds. Her updated medication list for this problem includes:    Bayer Childrens Aspirin 81 Mg Chew (Aspirin) .[name]... Take 1 tablet by mouth once a day    Atenolol 50 Mg Tabs (Atenolol) ..... One by mouth daily    Enalapril Maleate 20 Mg Tabs (Enalapril maleate) ..... One tablet by mouth twice a day    Amlodipine Besylate 10 Mg Tabs (Amlodipine besylate) ..... One by mouth daily    Nitrostat 0.4 Mg Subl (Nitroglycerin) ..... One by mouth sublingually as needed for chest pain  Orders: EKG w/ Interpretation (93000) Nuclear Stress Test (Nuc Stress Test)  Problem # 2:  HYPERTENSION (ICD-401.9) BP is a little high.  Will need f/u.  I would not change meds now. Her updated medication list for this problem includes:    Bayer Childrens Aspirin 81 Mg Chew (Aspirin) .[name]... Take 1 tablet by mouth once a day    Hydrochlorothiazide 25 Mg Tabs (Hydrochlorothiazide) .[name]... Take 1 tablet by mouth once a day    Atenolol 50 Mg Tabs (Atenolol) ..... One by mouth daily    Enalapril Maleate 20 Mg Tabs (Enalapril maleate) ..... One tablet by mouth twice a day    Amlodipine Besylate 10 Mg Tabs (Amlodipine besylate) ..... One by mouth daily  Problem # 3:  SLEEP APNEA (ICD-780.57) History is sugg for apnea.  will set up sleep study. Orders: Sleep Disorder Referral (Sleep Disorder)  Problem # 4:  DYSLIPIDEMIA (ICD-272.4) Will check fasting panel.  Patient Instructions: 1)  Your physician has requested that you have an adenosine myoview.  For further information please visit [email or web address].  Please follow instruction sheet, as given. we will call you with results 2)  Your physician recommends that you return for a FASTING lipid profile: day of stress test 786.5 3)  Your  physician has recommended that you have a sleep study.  This test records several body functions during sleep, including:  brain activity, eye movement, oxygen and carbon dioxide blood levels, heart rate and rhythm, breathing rate and rhythm, the flow of air through your mouth and nose, snoring, body muscle movements, and chest and belly movement.

## 2010-05-08 NOTE — Consult Note (Signed)
Summary: [facility]- no DM retinopathy  [facility]   Imported By: De Nurse 12/19/2009 09:21:40  _____________________________________________________________________  External Attachment:    Type:   Image     Comment:   External Document

## 2010-05-08 NOTE — Letter (Signed)
Summary: Generic Letter  [facility]  [address]   [place], [place] [address]   Phone: [phone]  Fax: [phone]    09/09/2008  [facility] [address] [place], [place]  [address]  Dear Ms. Tenny,  I wanted to let you know that your lab work looked very good. Your bad cholesterol (called LDL) is down to 79. It was 109 at our last check so this is an improvement. I think we should keep all your medicines like they are. Please call with questions. Nice to see you the other day.  Sincerely,   Myrtie Soman  MD  Appended Document: Generic Letter Mailed.

## 2010-05-08 NOTE — Assessment & Plan Note (Signed)
Summary: [facility]   Vital Signs:  Patient profile:   65 year old female Height:      63 inches Weight:      218 pounds Pulse rate:   63 / minute BP sitting:   145 / 80  (right arm)  Primary Care Provider:  Myrtie Soman  MD   History of Present Illness: Patient reports increase in SOB.  States her interest in quitting smoking is ZERO on a 0-10 scale with zero being not at all interested. Currently smokes 10-12 cigs perday.   Current Medications (verified): 1)  Actos 30 Mg Tabs (Pioglitazone Hcl) .... One By Mouth Daily 2)  Allopurinol 100 Mg Tabs (Allopurinol) .[name].. 1 Tablet By Mouth Once A Day 3)  Bayer Childrens Aspirin 81 Mg Chew (Aspirin) .... Take 1 Tablet By Mouth Once A Day 4)  Claritin 10 Mg Tabs (Loratadine) .... Take 1 Tablet By Mouth Once A Day 5)  Glucotrol Xl 10 Mg Tb24 (Glipizide) .... Take 2 Tablet By Mouth Once A Day 6)  Hydrochlorothiazide 25 Mg Tabs (Hydrochlorothiazide) .... Take 1 Tablet By Mouth Once A Day 7)  Trazamine 50 Mg  Misc (Trazodone & Diet Manage Prod) .[name].. 1 Tab By Mouth At Bedtime Prn 8)  Dicyclomine Hcl 20 Mg Tabs (Dicyclomine Hcl) .... One By Mouth Four Times Daily 9)  Flonase 50 Mcg/act  Susp (Fluticasone Propionate) .[name].. 1 Spray Each Nostril Daily 10)  Sertraline Hcl 100 Mg  Tabs (Sertraline Hcl) .... Two Tabs By Mouth Daily 11)  Atenolol 50 Mg  Tabs (Atenolol) .... One By Mouth Daily 12)  Enalapril Maleate 20 Mg  Tabs (Enalapril Maleate) .... One Tablet By Mouth Twice A Day 13)  Tussionex Pennkinetic Er 8-10 Mg/69ml  Lqcr (Chlorpheniramine-Hydrocodone) .... One Teaspoon Every 12 Hours As Needed For Cough. Dispense 60 Ml or Standard Amount 14)  Flexeril 5 Mg  Tabs (Cyclobenzaprine Hcl) .... Take One By Mouth At Bedtime As Needed For Back Pain 15)  Amlodipine Besylate 10 Mg  Tabs (Amlodipine Besylate) .... One By Mouth Daily 16)  Meclizine Hcl 25 Mg  Tabs (Meclizine Hcl) .... One By Mouth 2-3 Times A Day As Needed For Vertigo 17)   Colchicine 0.6 Mg Tabs (Colchicine) .... One By Mouth By Mouth Daily 18)  Ultram 50 Mg Tabs (Tramadol Hcl) .... One By Mouth Every 6 Hours  Allergies (verified): No Known Drug Allergies   Impression & Recommendations:  Problem # 1:  DYSPNEA/SHORTNESS OF BREATH (ICD-786.09) Assessment Deteriorated Spirometry evaluation with Pre and Post Bronchodilator reveals: mod-severe restriction with significant improvement post albuterol neb tx. Pt has been experiencing SOB for several months and is currently not taking any breathing medications.  Reviewed results of pulmonary function tests.  Pt verbalized understanding of results.  reinforced need for smoking cessation.  Suggest albuterol MDI if she continues to have worsening dyspnea.   possibly reevaluate need for Beta Blocker.    Written pt instructions provided.   TT:  50 minutes.   Her updated medication list for this problem includes:    Hydrochlorothiazide 25 Mg Tabs (Hydrochlorothiazide) .[name]... Take 1 tablet by mouth once a day    Atenolol 50 Mg Tabs (Atenolol) ..... One by mouth daily    Enalapril Maleate 20 Mg Tabs (Enalapril maleate) ..... One tablet by mouth twice a day  Orders: Albuterol Sulfate Sol 1mg  unit dose (Z6109) PFT Baseline-Pre/Post Bronchodiolator (PFT Baseline-Pre/Pos)  Problem # 2:  TOBACCO DEPENDENCE (ICD-305.1) Assessment: Unchanged Not interested in smoking  cessation at this time.  However, the results of the PFTs were explained today and the priority of quitting smioking was emphasized by letting her know that her "lung Ae" as 99 years.   She will need continued support to help her quit smoking.  She did not have time to address ths further today as she need to leave to attend a funeral.   Refer back to Rx Clinic as needed.   Complete Medication List: 1)  Actos 30 Mg Tabs (Pioglitazone hcl) .... One by mouth daily 2)  Allopurinol 100 Mg Tabs (Allopurinol) .[name].. 1 tablet by mouth once a day 3)  Bayer Childrens Aspirin 81  Mg Chew (Aspirin) .... Take 1 tablet by mouth once a day 4)  Claritin 10 Mg Tabs (Loratadine) .... Take 1 tablet by mouth once a day 5)  Glucotrol Xl 10 Mg Tb24 (Glipizide) .... Take 2 tablet by mouth once a day 6)  Hydrochlorothiazide 25 Mg Tabs (Hydrochlorothiazide) .... Take 1 tablet by mouth once a day 7)  Trazamine 50 Mg Misc (Trazodone & diet manage prod) .[name].. 1 tab by mouth at bedtime prn 8)  Dicyclomine Hcl 20 Mg Tabs (Dicyclomine hcl) .... One by mouth four times daily 9)  Flonase 50 Mcg/act Susp (Fluticasone propionate) .[name].. 1 spray each nostril daily 10)  Sertraline Hcl 100 Mg Tabs (Sertraline hcl) .... Two tabs by mouth daily 11)  Atenolol 50 Mg Tabs (Atenolol) .... One by mouth daily 12)  Enalapril Maleate 20 Mg Tabs (Enalapril maleate) .... One tablet by mouth twice a day 13)  Tussionex Pennkinetic Er 8-10 Mg/11ml Lqcr (Chlorpheniramine-hydrocodone) .... One teaspoon every 12 hours as needed for cough. dispense 60 ml or standard amount 14)  Flexeril 5 Mg Tabs (Cyclobenzaprine hcl) .... Take one by mouth at bedtime as needed for back pain 15)  Amlodipine Besylate 10 Mg Tabs (Amlodipine besylate) .... One by mouth daily 16)  Meclizine Hcl 25 Mg Tabs (Meclizine hcl) .... One by mouth 2-3 times a day as needed for vertigo 17)  Colchicine 0.6 Mg Tabs (Colchicine) .... One by mouth by mouth daily 18)  Ultram 50 Mg Tabs (Tramadol hcl) .... One by mouth every 6 hours   Medication Administration  Medication # 1:    Medication: Albuterol Sulfate Sol 1mg  unit dose    Diagnosis: TOBACCO DEPENDENCE (ICD-305.1)    Route: inhaled    Exp Date: 05/09/2010    Lot #: [id number]    Mfr: Nephron    Comments: Patient recieved2.5mg  of Albuterol    Patient tolerated medication without complications    Given by: Garen Grams LPN (October 04, 2008 9:56 AM)  Orders Added: 1)  Albuterol Sulfate Sol 1mg  unit dose [J7613] 2)  PFT Baseline-Pre/Post Bronchodiolator [PFT Baseline-Pre/Pos]    Pulmonary  Function Test Date: 10/04/2008 Height (in.): 63 Gender: Female  Pre-Spirometry FVC    Value: 1.21 L/min   Pred: 2.43 L/min     % Pred: 49 % FEV1    Value: 0.94 L     Pred: 1.97 L     % Pred: 47 % FEV1/FVC  Value: 78 %     Pred: 83 %     % Pred: 94 % FEF 25-75  Value: 0.78 L/min   Pred: 2.11 L/min     % Pred: 36 %  Post-Spirometry FVC    Value: 1.49 L/min   Pred: 2.43 L/min     % Pred: 61 % FEV1    Value: 1.25 L  Pred: 1.97 L     % Pred: 63 % FEV1/FVC  Value: 83 %     Pred: 83 %     % Pred: 100 % FEF 25-75  Value: 1.38 L/min   Pred: 2.11 L/min     % Pred: 65 %  Comments: Effort - fair to good Moderately sever restriction with signif improvementin FVC And FEV91following albuterol neb tx.  Lung Age 89years  COPD risk >90%  Recommendations: Stop smoking Consider bronchodilator tx

## 2010-05-08 NOTE — Progress Notes (Signed)
Summary: status of orders  Phone Note From Other Clinic Call back at [phone]   Caller: [facility]/Angela Summary of Call: checking status of orders that was dropped off with Britta Mccreedy last month Initial call taken by: ERIN LEVAN,  April 30, 2007 4:37 PM  Follow-up for Phone Call        told her that we faxed it back. if she cannot find it, please fax again Follow-up by: Golden Circle RN,  May 01, 2007 4:18 PM

## 2010-05-08 NOTE — Assessment & Plan Note (Signed)
Summary: BP CHECK/Kerrington Sova/[facility]  Nurse Visit   Vital Signs:  Patient Profile:   65 Years Old Female Height:     63 inches Pulse rate:   70 / minute BP sitting:   139 / 74  (left arm)  Vitals Entered By: Jacki Cones RN (January 30, 2007 9:40 AM)                 Prior Medications: ACTOS 15 MG TABS (PIOGLITAZONE HCL) Take 1 tablet by mouth once a day ALLOPURINOL 100 MG TABS (ALLOPURINOL) 1 tablet by mouth once a day B[employer]ER CHILDRENS ASPIRIN 81 MG CHEW (ASPIRIN) Take 1 tablet by mouth once a day CLARITIN 10 MG TABS (LORATADINE) Take 1 tablet by mouth once a day ENALAPRIL-HYDROCHLOROTHIAZIDE 5-12.5 MG  TABS (ENALAPRIL-HYDROCHLOROTHIAZIDE) take 2 tabs by mouth daily GLUCOTROL XL 10 MG TB24 (GLIPIZIDE) Take 2 tablet by mouth once a day HYDROCHLOROTHIAZIDE 25 MG TABS (HYDROCHLOROTHIAZIDE) Take 1 tablet by mouth once a day TRAZAMINE 50 MG  MISC (TRAZODONE & DIET MANAGE PROD) 1 tab by mouth at bedtime prn ROBAXIN 500 MG  TABS (METHOCARBAMOL) 1 tab daily PREDNISONE 20 MG  TABS (PREDNISONE) 1 tab by mouth for 7 days HYOMAX-SL 0.125 MG  SUBL (HYOSCYAMINE SULFATE) 2 tabs by mouth once daily FLONASE 50 MCG/ACT  SUSP (FLUTICASONE PROPIONATE) 1 spray each nostril daily SERTRALINE HCL 100 MG  TABS (SERTRALINE HCL) two tabs by mouth daily ATENOLOL 100 MG  TABS (ATENOLOL) take one by mouth daily       ]

## 2010-05-08 NOTE — Miscellaneous (Signed)
Summary: [facility]  FYI: [facility] dropped off an order that needs to be signed for pt to receive diabetic shoes, placed in MD box, once done needs to be faxed back to [employer]. Marland KitchenERIN ODELL  April 15, 2008 12:50 PM

## 2010-05-08 NOTE — Consult Note (Signed)
Summary: [facility]   Imported By: Raymond Gurney 11/17/2008 16:23:33  _____________________________________________________________________  External Attachment:    Type:   Image     Comment:   External Document  Appended Document: [facility]    Clinical Lists Changes  Observations: Added new observation of DIAB EYE EX: normal (11/14/2008 9:30)       Prevention & Chronic Care Immunizations   Influenza vaccine: Fluvax MCR  (12/28/2007)   Influenza vaccine due: 12/27/2008    Tetanus booster: 06/06/2004: Done.   Tetanus booster due: 06/07/2014    Pneumococcal vaccine: Done.  (03/08/2004)   Pneumococcal vaccine due: None    H. zoster vaccine: 03/08/2008: .  Colorectal Screening   Hemoccult: Done.  (09/07/2003)   Hemoccult due: Not Indicated    Colonoscopy: normal  (04/19/2004)   Colonoscopy due: 04/19/2014  Other Screening   Pap smear: normal  (06/23/2007)   Pap smear due: 06/23/2010    Mammogram: Normal  (12/22/2007)   Mammogram due: 12/21/2008    DXA bone density scan: Not documented   Smoking status: current  (11/11/2008)   Smoking cessation counseling: yes  (12/28/2007)  Diabetes Mellitus   HgbA1C: 7.0  (11/11/2008)   Hemoglobin A1C due: 01/20/2008    Eye exam: normal  (11/14/2008)   Eye exam due: 11/15/2008    Foot exam: Not documented   High risk foot: Not documented   Foot care education: Not documented   Foot exam due: 12/27/2008    Urine microalbumin/creatinine ratio: Not documented   Urine microalbumin/cr due: 11/26/2008  Lipids   Total Cholesterol: 198  (10/20/2007)   LDL: See Comment mg/dL  (10/20/2007)   LDL Direct: 79  (09/08/2008)   HDL: 30  (10/20/2007)   Triglycerides: 414  (10/20/2007)    SGOT (AST): 17  (09/08/2008)   SGPT (ALT): 17  (09/08/2008)   Alkaline phosphatase: 105  (09/08/2008)   Total bilirubin: 0.3  (09/08/2008)  Hypertension   Last Blood Pressure: 122 / 78  (11/11/2008)  Serum creatinine: 0.75  (09/08/2008)   Serum potassium 4.1  (09/08/2008)  Self-Management Support :    Diabetes self-management support: Not documented    Hypertension self-management support: Not documented    Lipid self-management support: Not documented     Allergies: No Known Drug Allergies   -  Date:  11/14/2008    Diabetes Eye Exam normal

## 2010-05-08 NOTE — Assessment & Plan Note (Signed)
Summary: consult for osa   Copy to:  Dorris Carnes Primary Provider/Referring Provider:  [name]  MD  CC:  Sleep Consult.  History of Present Illness: The pt is a 65y/o female who comes in today for management of osa.  She had a recent npsg in Dec of last year, and found to have mild osa with AHI of 15/hr with desat to 75%.  She spent 14mn with sat less than 88%.  The pt has been noted to have loud snoring, and also pauses in her breathing during sleep.  She typically goes to bed btw 8p-1a, and arises at 7am to start her day.  She feels rested most of the time, but clearly has days where she feels poorly.  She gets up at least 3 times a night to go to bathroom.  She does have intermittant sleep pressure during the day with periods of inactivity, and admits that some days she "wakes up stupid".  Overall though, she is satisfied with her daytime alertness.  She will nap during the day on occasion.  She denies dozing in the evening in front of the tv, and does not currently drive.  Her weight has increased 40 pounds over the last 2 years.  Medications Prior to Update: 1)  Actos 30 Mg Tabs (Pioglitazone Hcl) .... One By Mouth Daily 2)  Allopurinol 200 Mg Tab .... Take 1 Tab By Mouth Daily For Gout 3)  Bayer Childrens Aspirin 81 Mg Chew (Aspirin) .... Take 1 Tablet By Mouth Once A Day 4)  Claritin 10 Mg Tabs (Loratadine) .... Take 1 Tablet By Mouth Once A Day 5)  Glucotrol Xl 10 Mg Tb24 (Glipizide) .... Take 2 Tablet By Mouth Once A Day 6)  Hydrochlorothiazide 25 Mg Tabs (Hydrochlorothiazide) .... Take 1 Tablet By Mouth Once A Day 7)  Trazamine 50 Mg  Misc (Trazodone & Diet Manage Prod) ..[name]. 1 Tab By Mouth At Bedtime Prn 8)  Dicyclomine Hcl 20 Mg Tabs (Dicyclomine Hcl) .... One By Mouth Four Times Daily 9)  Flonase 50 Mcg/act  Susp (Fluticasone Propionate) ..[name]. 1 Spray Each Nostril Daily 10)  Sertraline Hcl 100 Mg  Tabs (Sertraline Hcl) .... Two Tabs By Mouth Daily 11)  Atenolol 50 Mg  Tabs  (Atenolol) .... One By Mouth Daily 12)  Enalapril Maleate 20 Mg  Tabs (Enalapril Maleate) .... One Tablet By Mouth Twice A Day 13)  Guaifenesin-Codeine 100-10 Mg/552mLiqd (Guaifenesin-Codeine) .... Take 1-2 Teaspoons Every 6 Hours As Needed For Cough; Disp 60 Cc 14)  Flexeril 5 Mg  Tabs (Cyclobenzaprine Hcl) .... Take One By Mouth At Bedtime As Needed For Back Pain 15)  Amlodipine Besylate 10 Mg  Tabs (Amlodipine Besylate) .... One By Mouth Daily 16)  Ultram 50 Mg Tabs (Tramadol Hcl) .... One By Mouth Every 6 Hours 17)  Nitrostat 0.4 Mg Subl (Nitroglycerin) .... One By Mouth Sublingually As Needed For Chest Pain 18)  Lipitor 10 Mg Tabs (Atorvastatin Calcium) .... One By Mouth Daily For Cholesterol  Allergies (verified): No Known Drug Allergies  Past History:  Past Medical History: mild aortic regurg, mild MR. OBESITY, UNSPECIFIED (ICD-278.00) OBSTRUCTIVE SLEEP APNEA (ICD-327.23) HYPERTENSION (ICD-401.9) CHEST PAIN (ICD-786.50) RESTRICTIVE LUNG DISEASE (ICD-518.89) DYSPNEA/SHORTNESS OF BREATH (ICD-786.09) HIP PAIN, BILATERAL (ICD-719.45) DYSLIPIDEMIA (ICD-272.4) BENIGN POSITIONAL VERTIGO (ICD-386.11) SCIATICA, RIGHT (ICD-724.3) GOUT NOS (ICD-274.9) SEBORRHEIC KERATOSIS (ICD-702.19) TOBACCO DEPENDENCE (ICD-305.1) RHINITIS, ALLERGIC (ICD-477.9) OSTEOARTHRITIS, MULTI SITES (ICD-715.98) MENOPAUSAL SYNDROME (ICD-627.2) IRRITABLE BOWEL SYNDROME (ICD-564.1) HYPERTENSION, BENIGN SYSTEMIC (ICD-401.1) DIABETES MELLITUS II, UNCOMPLICATED ([id number],  MAJOR, RECURRENT (ICD-296.30) ANXIETY (ICD-300.00)      Past Surgical History: Hysterectomy--partial -, L rotator cuff repair 11/07-[facility] - 03/14/2006,  R shoulder surgery w/ Dr. Jenean Lindau -,  tonsillectomy - B feet surgery  echo 10/10: mild pulm HTN, EF 60-65%, mild LVH, moderate aortic regurg  Family History: Reviewed history from 01/12/2009 and no changes required. heart disease: mother (M.I.) rheumatism:  mother cancer: mother (breast), sister (pancreatic)  Mother: HTN, DM<  sister: Dm  Social History: Reviewed history from 04/26/2009 and no changes required. Divorced '97 and now single.   Lives alone,  pt has 4 children. smokes 1/2 ppd.  started at age 35. No etoh.  Currently disabled.  Previously worked as a [employer]arry manager.  Has trouble affording medications. Recently moved to 1st floor apartment housing (end of June 2010). Likes it there.  Grandson recently sent to jail - (38 - 42 months) -- this is stressful for the patient.  Review of Systems       The patient complains of productive cough, chest pain, weight change, ear ache, and depression.  The patient denies shortness of breath with activity, shortness of breath at rest, non-productive cough, coughing up blood, irregular heartbeats, acid heartburn, indigestion, loss of appetite, abdominal pain, difficulty swallowing, sore throat, tooth/dental problems, headaches, nasal congestion/difficulty breathing through nose, sneezing, itching, anxiety, hand/feet swelling, joint stiffness or pain, rash, change in color of mucus, and fever.    Vital Signs:  Patient profile:   65 year old female Height:      62 inches Weight:      228.25 pounds BMI:     41.90 O2 Sat:      98 % on Room air Temp:     97.5 degrees F oral Pulse rate:   60 / minute BP sitting:   106 / 60  (left arm) Cuff size:   large  Vitals Entered By: Matthew Folks LPN (April 27, 8982 2:18 PM)  O2 Flow:  Room air CC: Sleep Consult Comments Medications reviewed with patient Matthew Folks LPN  April 27, 2101 2:31 PM    Physical Exam  General:  obese female in nad Eyes:  PERRLA and EOMI.   Nose:  turbinate hypertrophy bilat but patent Mouth:  sigificant elongation of soft palate and uvula Neck:  no jvd, tmg, LN Lungs:  clear to auscultation Heart:  rrr, 3/6 sem Abdomen:  soft and nontender, bs+ Extremities:  mild edema, pulses intact distally no  cyanosis Neurologic:  alert and oriented, moves all 4.   Impression & Recommendations:  Problem # 1:  OBSTRUCTIVE SLEEP APNEA (ICD-327.23) the pt only has mild osa by her sleep study, and is only intermittantly symptomatic during the day.  However, she has a lot of comorbid conditions, and does have significant desaturation associated with her apnea.  My better judgement tells me that she would benefit from aggressive treatment even though she has mild disease and symptoms.  I have recommended a trial of cpap while she is working on weight loss, and she is agreeable to trying.    Other Orders: Consultation Level IV (12811) DME Referral (DME)  Patient Instructions: 1)  will set up on cpap. please call if having issues with tolerance 2)  work on weight loss 3)  followup with me in 4 weeks.

## 2010-05-08 NOTE — Assessment & Plan Note (Signed)
Summary: BP CHECK/Lekeisha Arenas[facility]  Nurse Visit   Vital Signs:  Patient Profile:   65 Years Old Female Height:     63 inches Pulse rate:   52 / minute BP sitting:   162 / 73  (left arm)  Vitals Entered By: Jacki Cones RN (February 06, 2007 10:25 AM)                 Prior Medications: ACTOS 15 MG TABS (PIOGLITAZONE HCL) Take 1 tablet by mouth once a day ALLOPURINOL 100 MG TABS (ALLOPURINOL) 1 tablet by mouth once a day BAYER CHILDRENS ASPIRIN 81 MG CHEW (ASPIRIN) Take 1 tablet by mouth once a day CLARITIN 10 MG TABS (LORATADINE) Take 1 tablet by mouth once a day ENALAPRIL-HYDROCHLOROTHIAZIDE 5-12.5 MG  TABS (ENALAPRIL-HYDROCHLOROTHIAZIDE) take 2 tabs by mouth daily GLUCOTROL XL 10 MG TB24 (GLIPIZIDE) Take 2 tablet by mouth once a day HYDROCHLOROTHIAZIDE 25 MG TABS (HYDROCHLOROTHIAZIDE) Take 1 tablet by mouth once a day TRAZAMINE 50 MG  MISC (TRAZODONE & DIET MANAGE PROD) 1 tab by mouth at bedtime prn ROBAXIN 500 MG  TABS (METHOCARBAMOL) 1 tab daily PREDNISONE 20 MG  TABS (PREDNISONE) 1 tab by mouth for 7 days HYOMAX-SL 0.125 MG  SUBL (HYOSCYAMINE SULFATE) 2 tabs by mouth once daily FLONASE 50 MCG/ACT  SUSP (FLUTICASONE PROPIONATE) 1 spray each nostril daily SERTRALINE HCL 100 MG  TABS (SERTRALINE HCL) two tabs by mouth daily ATENOLOL 100 MG  TABS (ATENOLOL) take one by mouth daily       ]

## 2010-05-08 NOTE — Progress Notes (Signed)
Summary: requesting test results  Phone Note Call from Patient Call back at Home Phone [phone]   Reason for Call: Talk to Doctor Summary of Call: pt is requesting to speak with MD re: test results from last week Initial call taken by: ERIN LEVAN,  November 24, 2006 9:08 AM  Follow-up for Phone Call        Pt. informed of A1C and urine microalbumin results.  Wants to know if she needs to do anything about the proteinuria.  Also, wants med for her sinus congestion.  Has medicaid and unable to afford to pay out-of-pocket for OTC Claritin.  Wants a Rx called to pharmacy that her medicaid will cover. Pt. uses [facility]  Stann Mainland check with Dr. Rexene Alberts and call Pt. back. Follow-up by: Altamese Dilling CMA,,  November 24, 2006 12:01 PM

## 2010-05-08 NOTE — Assessment & Plan Note (Signed)
Summary: rov for osa   Copy to:  Dietrich Pates Primary Provider/Referring Provider:  Myrtie Soman  MD  CC:  Pt is here for a 4 week f/u appt.  Pt states she is wearing her cpap machine every night.  Approx 3 to 4 hours per night.  Pt denied any complaints with mask or pressure.  [name]  History of Present Illness: The pt comes in today for f/u of her known osa.  She is wearing cpap compliantly 4 hrs a night, but typically forgets to put back on when she arises to go to bathroom during the night.  She has no issues with pressure, and feels the mask fits well.  She does sleep better with cpap, and her alertness and energy level during the day are improved.  Current Medications (verified): 1)  Actos 30 Mg Tabs (Pioglitazone Hcl) .... One By Mouth Daily 2)  Allopurinol 300 Mg Tabs (Allopurinol) .... One By Mouth Daily To Prevent Gout 3)  Bayer Childrens Aspirin 81 Mg Chew (Aspirin) .... Take 1 Tablet By Mouth Once A Day 4)  Claritin 10 Mg Tabs (Loratadine) .... Take 1 Tablet By Mouth Once A Day 5)  Glucotrol Xl 10 Mg Tb24 (Glipizide) .... Take 2 Tablet By Mouth Once A Day 6)  Hydrochlorothiazide 25 Mg Tabs (Hydrochlorothiazide) .... Take 1 Tablet By Mouth Once A Day 7)  Dicyclomine Hcl 20 Mg Tabs (Dicyclomine Hcl) .... One By Mouth Four Times Daily 8)  Flonase 50 Mcg/act  Susp (Fluticasone Propionate) .[name].. 1 Spray Each Nostril Daily 9)  Sertraline Hcl 100 Mg  Tabs (Sertraline Hcl) .... Two Tabs By Mouth Daily 10)  Atenolol 50 Mg  Tabs (Atenolol) .... One By Mouth Daily 11)  Enalapril Maleate 20 Mg  Tabs (Enalapril Maleate) .... One Tablet By Mouth Twice A Day 12)  Guaifenesin-Codeine 100-10 Mg/71ml Liqd (Guaifenesin-Codeine) .... Take 1-2 Teaspoons Every 6 Hours As Needed For Cough; Disp 60 Cc 13)  Flexeril 5 Mg  Tabs (Cyclobenzaprine Hcl) .... Take One By Mouth At Bedtime As Needed For Back Pain 14)  Amlodipine Besylate 10 Mg  Tabs (Amlodipine Besylate) .... One By Mouth Daily 15)  Nitrostat 0.4 Mg  Subl (Nitroglycerin) .... One By Mouth Sublingually As Needed For Chest Pain 16)  Lipitor 10 Mg Tabs (Atorvastatin Calcium) .... One By Mouth Daily For Cholesterol 17)  Chantix Continuing Month Pak 1 Mg Tabs (Varenicline Tartrate) .... Take As Directed  Allergies (verified): No Known Drug Allergies  Review of Systems      See HPI  Vital Signs:  Patient profile:   65 year old female Height:      62 inches Weight:      223 pounds BMI:     40.93 O2 Sat:      95 % on Room air Temp:     98.0 degrees F oral Pulse rate:   56 / minute BP sitting:   120 / 62  (left arm) Cuff size:   large  Vitals Entered By: Arman Filter LPN (June 07, 1608 10:01 AM)  O2 Flow:  Room air CC: Pt is here for a 4 week f/u appt.  Pt states she is wearing her cpap machine every night.  Approx 3 to 4 hours per night.  Pt denied any complaints with mask or pressure.   Comments Medications reviewed with patient Arman Filter LPN  June 08, 9602 10:01 AM    Physical Exam  General:  obese female in nad Nose:  no skin breakdown or pressure necrosis from cpap mask. Neurologic:  alert, not sleepy, moves all 4.   Impression & Recommendations:  Problem # 1:  OBSTRUCTIVE SLEEP APNEA (ICD-327.23) the pt has adapted well to cpap, but needs to be more consistent about putting mask back on when she gets up to go to bathroom.  She has no issues with mask or pressure tolerance, and does feel better during the day.  We need to optimize her pressure for her, and she needs to work on increased compliance and weight loss.  Other Orders: Est. Patient Level II (29528) DME Referral (DME)  Patient Instructions: 1)  will optimize your cpap pressure for you, and let you know the results. 2)  work on wearing cpap all thru the night. 3)  work on weight loss 4)  followup with me in 6mos.  Appended Document: rov for osa    Clinical Lists Changes  Observations: Added new observation of PAST MED HX: mild aortic regurg,  mild MR. OBESITY, UNSPECIFIED (ICD-278.00) OBSTRUCTIVE SLEEP APNEA (ICD-327.23) - on CPAP per Dr. Shelle Iron HYPERTENSION (ICD-401.9) CHEST PAIN (ICD-786.50) RESTRICTIVE LUNG DISEASE (ICD-518.89) DYSPNEA/SHORTNESS OF BREATH (ICD-786.09) HIP PAIN, BILATERAL (ICD-719.45) DYSLIPIDEMIA (ICD-272.4) BENIGN POSITIONAL VERTIGO (ICD-386.11) SCIATICA, RIGHT (ICD-724.3) GOUT NOS (ICD-274.9) SEBORRHEIC KERATOSIS (ICD-702.19) TOBACCO DEPENDENCE (ICD-305.1) RHINITIS, ALLERGIC (ICD-477.9) OSTEOARTHRITIS, MULTI SITES (ICD-715.98) MENOPAUSAL SYNDROME (ICD-627.2) IRRITABLE BOWEL SYNDROME (ICD-564.1) HYPERTENSION, BENIGN SYSTEMIC (ICD-401.1) DIABETES MELLITUS II, UNCOMPLICATED (ICD-250.00) DEPRESSION, MAJOR, RECURRENT (ICD-296.30) ANXIETY (ICD-300.00)     (06/08/2009 11:50) Added new observation of PRIMARY MD: Myrtie Soman  MD (06/08/2009 11:50)        Past History:  Past Medical History: mild aortic regurg, mild MR. OBESITY, UNSPECIFIED (ICD-278.00) OBSTRUCTIVE SLEEP APNEA (ICD-327.23) - on CPAP per Dr. Shelle Iron HYPERTENSION (ICD-401.9) CHEST PAIN (ICD-786.50) RESTRICTIVE LUNG DISEASE (ICD-518.89) DYSPNEA/SHORTNESS OF BREATH (ICD-786.09) HIP PAIN, BILATERAL (ICD-719.45) DYSLIPIDEMIA (ICD-272.4) BENIGN POSITIONAL VERTIGO (ICD-386.11) SCIATICA, RIGHT (ICD-724.3) GOUT NOS (ICD-274.9) SEBORRHEIC KERATOSIS (ICD-702.19) TOBACCO DEPENDENCE (ICD-305.1) RHINITIS, ALLERGIC (ICD-477.9) OSTEOARTHRITIS, MULTI SITES (ICD-715.98) MENOPAUSAL SYNDROME (ICD-627.2) IRRITABLE BOWEL SYNDROME (ICD-564.1) HYPERTENSION, BENIGN SYSTEMIC (ICD-401.1) DIABETES MELLITUS II, UNCOMPLICATED (ICD-250.00) DEPRESSION, MAJOR, RECURRENT (ICD-296.30) ANXIETY (ICD-300.00)

## 2010-05-08 NOTE — Assessment & Plan Note (Signed)
Summary: F/U VISIT [facility]   Vital Signs:  Patient Profile:   65 Years Old Female Height:     63 inches Weight:      205.8 pounds Temp:     98.5 degrees F Pulse rate:   64 / minute BP sitting:   161 / 84  Pt. in pain?   yes    Location:   shoulders    Intensity:   5  Vitals Entered By: Christen Bame CMA (September 19, 2006 9:40 AM)                Chief Complaint:  F/U BP.  History of Present Illness: Hypertension:      Using medication without problems or lightheadedness: y Chest pain with exertion:n Edema: occ pedal edema at night Short of breath:n Average home BPs:n/a Other issues: increased BB at last OV. see below.  labs reviewed w/patient.  depression- h/o MDD.  mood has been more depressed lately.  no income.  rehabing shoulder- hopes to return to work.  housing change along with chronic MSK illness (and finances)--> increase in stress.  patient thinks this is affecting mood.  has follow up with [place] [facility] on Monday.  no SI/HI now.  does contract for safety.     Social History:    Reviewed history from 06/05/2006 and no changes required:       Divorced '97.  Lives alone, h/o tobacco.  No etoh.  Applying for disabilty through [facility] (ortho)    Review of Systems  The patient denies anorexia, fever, hoarseness, chest pain, syncope, dyspnea on exhertion, prolonged cough, hemoptysis, abdominal pain, and melena.     Physical Exam  General:     GEN: nad, alert and oriented HEENT: mucous membranes moist NECK: supple CV: rrr, no heave appreciated.  no murmur PULM: ctab, no inc wob ABD: soft, +bs EXT: no edema SKIN: no acute rash     Impression & Recommendations:  Problem # 1:  HYPERTENSION, BENIGN SYSTEMIC (ICD-401.1) Assessment: Improved improved but not at goal.  recent BMET wnl.  no change in meds as social factors likely affecting BP.  will follow up with new PMD in  ~6 wks to follow up BP and review social situation. Orders: [facility]- Est Level  3  ([id number])   Problem # 2:  DEPRESSION, MAJOR, RECURRENT (ICD-296.30) Assessment: Deteriorated follow up with MH and contract for safety.  RTO here at [facility] sooner as needed.  Orders: [facility]- Est Level  3 ([id number])    Patient Instructions: 1)  Please schedule a follow-up appointment in 1-2 months to talk with Dr. Sarita Haver about your blood pressure.  If you should start to feel worse- especially if your mood gets worse- let us or another doctor know.  I want you to follow up with mental health on Monday.  Take care.

## 2010-05-08 NOTE — Assessment & Plan Note (Signed)
Summary: F/U BP [facility]   Vital Signs:  Patient Profile:   65 Years Old Female Height:     63 inches Weight:      206.5 pounds BMI:     36.71 Temp:     98.8 degrees F Pulse rate:   77 / minute BP sitting:   173 / 85  Pt. in pain?   yes    Location:   shoulders    Intensity:   4  Vitals Entered By: Golden Circle RN (Aug 22, 2006 9:49 AM)              Is Patient Diabetic? Yes    Chief Complaint:  multiple issues.  History of Present Illness: Hypertension:      Using medication without problems or lightheadedness: y Chest pain with exertion:n Edema:n Short of breath:n Average home BPs:na Other issues: needs BMET today.  skin lesion- not itching, not changing.  <1cm on L calf.  no other rash.  no discharge, crust, ulceration.    Past Medical History:    Reviewed history from 06/05/2006 and no changes required:       Gout  274   Family History:    Reviewed history from 06/05/2006 and no changes required:       Mom w/ DM, HTN.  Sister w/ DM.  No breast Ca.  Social History:    Reviewed history from 06/05/2006 and no changes required:       Divorced '97.  Lives alone, h/o tobacco.  No etoh.  Applying for disabilty through [facility] (ortho)   Risk Factors:    Review of Systems  The patient denies anorexia, fever, weight loss, chest pain, syncope, dyspnea on exhertion, peripheral edema, prolonged cough, hemoptysis, abdominal pain, and melena.     Physical Exam  General:     GEN: nad, alert and oriented HEENT: mucous membranes moist NECK: supple, no JVD CV: rrr, no heave appreciated.  no murmur PULM: ctab, no inc wob ABD: soft, +bs EXT: no edema SKIN: no acute rash, <1cm lesion on L calf= stuck on appearance w/o atypical vessels/scale/ulcer.  c/w SK.    Impression & Recommendations:  Problem # 1:  SEBORRHEIC KERATOSIS (ICD-702.19) Assessment: New not irritated.  observe.  remove if irritated or changing. Orders: [facility]- Est Level  3 ([id number])   Problem  # 2:  HYPERTENSION, BENIGN SYSTEMIC (ICD-401.1) Assessment: Deteriorated increase beta blocker. See Dr. Tiajuana Amass.  check BMET follow up several weeks.  patient agrees. Orders: [facility]- Est Level  3 ([id number]) [facility] ([id number])    Patient Instructions: 1)  Please schedule a follow-up appointment in 2-3 weeks. 2)  I'll send your prescriptions to the pharmacy.  You'll need to increase your metoprolol.  Take care.

## 2010-05-08 NOTE — Assessment & Plan Note (Signed)
Summary: bp wp   Vital Signs:  Patient Profile:   65 Years Old Female Height:     63 inches Weight:      205.6 pounds Temp:     98.7 degrees F Pulse rate:   55 / minute BP sitting:   144 / 70  (left arm)  Pt. in pain?   yes    Location:   left foot    Intensity:   4    Type:       aching  Vitals Entered ByJacki Cones RN (December 24, 2006 2:49 PM)                  PCP:  Myrtie Soman  MD  Chief Complaint:  f/u bp and gout attack 12/22/06.  History of Present Illness: Ms. Goette comes in today to follow-up from previous visit regarding her high BP, and 2+ microalbumin on UA. States that things are going well except for gout attack starting on Monday. States that she has been taking 3 200mg  advil every 4-6hrs for the pain. States that pain is persisting. Otherwise pt is doing well. Denies any CP or SOB. Has not had any trouble taking her medications. Is concerned about the protein in her urine. Pt is pleased with BP reading today.   Needs refills on a glucometer, hycosamine and would like something for the gout pain.   Updated/Current Medications (including changes made in today's visit):  ACTOS 15 MG TABS (PIOGLITAZONE HCL) Take 1 tablet by mouth once a day ALLOPURINOL 100 MG TABS (ALLOPURINOL) 1 tablet by mouth once a day BAYER CHILDRENS ASPIRIN 81 MG CHEW (ASPIRIN) Take 1 tablet by mouth once a day CLARITIN 10 MG TABS (LORATADINE) Take 1 tablet by mouth once a day ENALAPRIL MALEATE 20 MG TABS (ENALAPRIL MALEATE) Take 1 tablet by mouth once a day GLUCOTROL XL 10 MG TB24 (GLIPIZIDE) Take 2 tablet by mouth once a day HYDROCHLOROTHIAZIDE 25 MG TABS (HYDROCHLOROTHIAZIDE) Take 1 tablet by mouth once a day METOPROLOL TARTRATE 100 MG TABS (METOPROLOL TARTRATE) Take 1 tablet by mouth twice a day TRAZAMINE 50 MG  MISC (TRAZODONE & DIET MANAGE PROD) 1 tab by mouth at bedtime prn ROBAXIN 500 MG  TABS (METHOCARBAMOL) 1 tab daily PREDNISONE 20 MG  TABS (PREDNISONE) 1 tab by mouth  for 7 days HYOMAX-SL 0.125 MG  SUBL (HYOSCYAMINE SULFATE) 2 tabs by mouth once daily FLONASE 50 MCG/ACT  SUSP (FLUTICASONE PROPIONATE) 1 spray each nostril daily SERTRALINE HCL 100 MG  TABS (SERTRALINE HCL) two tabs by mouth daily   Past Medical History:    Gout  274    DM type II    HTN   Family History:    Reviewed history from 06/05/2006 and no changes required:       Mom w/ DM, HTN.  Sister w/ DM.  No breast Ca.  Social History:    Reviewed history from 06/05/2006 and no changes required:       Divorced '97.  Lives alone, smokes.  No etoh.  Applying for disabilty through [facility] (ortho)   Risk Factors:    Review of Systems  The patient denies anorexia, weight loss, chest pain, syncope, prolonged cough, and abdominal pain.     Physical Exam  General:     Well-developed,well-nourished,in no acute distress; alert,appropriate and cooperative throughout examination Lungs:     Normal respiratory effort, chest expands symmetrically. Lungs are clear to auscultation, no crackles or wheezes. Heart:  Normal rate and regular rhythm. S1 and S2 normal without gallop, murmur, click, rub or other extra sounds. Abdomen:     Bowel sounds positive,abdomen soft and non-tender without masses, organomegaly or hernias noted. Msk:     No deformity or scoliosis noted of thoracic or lumbar spine.  Mild swelling/erythema of 1st MTP joint. Mildly tender to palpation over dorsum of foot and over MTP joint. Pulses:     R and L carotid,radial,femoral,dorsalis pedis and posterior tibial pulses are full and equal bilaterally Extremities:     No clubbing, cyanosis, edema, or deformity noted with normal full range of motion of all joints.   Neurologic:     Grossly intact Skin:     Intact without suspicious lesions or rashes Psych:     Cognition and judgment appear intact. Alert and cooperative with normal attention span and concentration. No apparent delusions, illusions,  hallucinations    Impression & Recommendations:  Problem # 1:  HYPERTENSION, BENIGN SYSTEMIC (ICD-401.1) Will increase Enalapril to 40 mg by mouth daily. Will await BMET results. If cr high will need to go back to regular dose of ACEi. Pt not at goal BP (125/70). Will continue to work towards this goal.  Her updated medication list for this problem includes:    Enalapril Maleate 20 Mg Tabs (Enalapril maleate) .[name]... Take 1 tablet by mouth once a day    Hydrochlorothiazide 25 Mg Tabs (Hydrochlorothiazide) .[name]... Take 1 tablet by mouth once a day    Metoprolol Tartrate 100 Mg Tabs (Metoprolol tartrate) .[name]... Take 1 tablet by mouth twice a day   Problem # 2:  GOUT NOS (ICD-274.9) Pt did not have nabumetone on list of meds. Taking allopurinol regularly. On HCTZ however has been taking this med for some time. Given HTN and DM, will not pull this med off (esp since pt not at goal BP). Will give 20 mg prednisone for 7 days for pain. Pt's last A1C was below 7 and sugars can tolerate this short-term course.   The following medications were removed from the medication list:    Nabumetone 750 Mg Tabs (Nabumetone) .[name]..[name] Two times a day after meals  Her updated medication list for this problem includes:    Allopurinol 100 Mg Tabs (Allopurinol) .[name]... 1 tablet by mouth once a day   Problem # 3:  DIABETES MELLITUS II, UNCOMPLICATED (ICD-250.00) Continue current meds. Will increase Enalapril to 40 mg per day if Cr tolerates. Pt advised to stop Advil. Will start prednisone for 7 days to treat gout symptoms. Last A1C below 7. Controlled. Sugars will tolerate short course of steriods. Her updated medication list for this problem includes:    Actos 15 Mg Tabs (Pioglitazone hcl) .[name]... Take 1 tablet by mouth once a day    Bayer Childrens Aspirin 81 Mg Chew (Aspirin) .[name]... Take 1 tablet by mouth once a day    Enalapril Maleate 20 Mg Tabs (Enalapril maleate) .[name]... Take 1 tablet by mouth once a day    Glucotrol  Xl 10 Mg Tb24 (Glipizide) .[name]... Take 2 tablet by mouth once a day  Orders: Basic Met-[facility] (03474-25956)   Complete Medication List: 1)  Actos 15 Mg Tabs (Pioglitazone hcl) .... Take 1 tablet by mouth once a day 2)  Allopurinol 100 Mg Tabs (Allopurinol) .[name].. 1 tablet by mouth once a day 3)  Bayer Childrens Aspirin 81 Mg Chew (Aspirin) .... Take 1 tablet by mouth once a day 4)  Claritin 10 Mg Tabs (Loratadine) .... Take 1  tablet by mouth once a day 5)  Enalapril Maleate 20 Mg Tabs (Enalapril maleate) .... Take 1 tablet by mouth once a day 6)  Glucotrol Xl 10 Mg Tb24 (Glipizide) .... Take 2 tablet by mouth once a day 7)  Hydrochlorothiazide 25 Mg Tabs (Hydrochlorothiazide) .... Take 1 tablet by mouth once a day 8)  Metoprolol Tartrate 100 Mg Tabs (Metoprolol tartrate) .... Take 1 tablet by mouth twice a day 9)  Trazamine 50 Mg Misc (Trazodone & diet manage prod) .[name].. 1 tab by mouth at bedtime prn 10)  Robaxin 500 Mg Tabs (Methocarbamol) .[name].. 1 tab daily 11)  Prednisone 20 Mg Tabs (Prednisone) .[name].. 1 tab by mouth for 7 days 12)  Hyomax-sl 0.125 Mg Subl (Hyoscyamine sulfate) .... 2 tabs by mouth once daily 13)  Flonase 50 Mcg/act Susp (Fluticasone propionate) .[name].. 1 spray each nostril daily 14)  Sertraline Hcl 100 Mg Tabs (Sertraline hcl) .... Two tabs by mouth daily   Patient Instructions: 1)  Increase your enalapril to 2 pills per day. 2)  Stop taking advil. 3)  Take the prednisone for 7 days to help your gout inflammation go down.  4)  We'll call you with your lab results and let you know if we need to adjust your medicines. 5)  Keep up the good work.    Prescriptions: FLONASE 50 MCG/ACT  SUSP (FLUTICASONE PROPIONATE) 1 spray each nostril daily  #60 sprays x 2   Entered and Authorized by:   Myrtie Soman  MD   Signed by:   Myrtie Soman  MD on 12/24/2006   Method used:   Print then Give to Patient   RxID:   [id number] HYOMAX-SL 0.125 MG  SUBL (HYOSCYAMINE SULFATE) 2 tabs by  mouth once daily  #60 x 2   Entered and Authorized by:   Myrtie Soman  MD   Signed by:   Myrtie Soman  MD on 12/24/2006   Method used:   Print then Give to Patient   RxID:   [id number] PREDNISONE 20 MG  TABS (PREDNISONE) 1 tab by mouth for 7 days  #7 x 0   Entered and Authorized by:   Myrtie Soman  MD   Signed by:   Myrtie Soman  MD on 12/24/2006   Method used:   Print then Give to Patient   RxID:   [id number]  ]

## 2010-05-08 NOTE — Miscellaneous (Signed)
  Clinical Lists Changes  Problems: Removed problem of SLEEP APNEA (ICD-780.57) Removed problem of DYSPNEA/SHORTNESS OF BREATH (ICD-786.09) Removed problem of BENIGN POSITIONAL VERTIGO (ICD-386.11) Removed problem of VACCINE AGAINST INFLUENZA (ICD-V04.81) Removed problem of SEBORRHEIC KERATOSIS (ICD-702.19) Removed problem of MENOPAUSAL SYNDROME (ICD-627.2) Medications: Removed medication of GUAIFENESIN-CODEINE 100-10 MG/5ML LIQD (GUAIFENESIN-CODEINE) take 1-2 teaspoons every 6 hours as needed for cough; disp 60 cc Removed medication of FLEXERIL 5 MG  TABS (CYCLOBENZAPRINE HCL) take one by mouth at bedtime as needed for back pain

## 2010-05-08 NOTE — Assessment & Plan Note (Signed)
Summary: f/up,tcb   Vital Signs:  Patient profile:   65 year old female Height:      62 inches Weight:      221 pounds BMI:     40.57 BSA:     2.00 Temp:     98.5 degrees F Pulse rate:   66 / minute BP sitting:   118 / 75  Vitals Entered By: Jone Baseman CMA (September 15, 2009 9:40 AM) CC: f/u DM Is Patient Diabetic? Yes Did you bring your meter with you today? No Pain Assessment Patient in pain? yes     Location: neck and knees Intensity: 6   Primary Care Provider:  Myrtie Soman  MD  CC:  f/u DM.  History of Present Illness: 1. Diabetes taking medications: yes  problems with medications?:no  hypoglycemic events?: no subjective: doing well overall. A1c 6.8 today. Went to see Dr. Gerilyn Pilgrim for nutrition appointment in April but was late and visit was not complete. Willing to try to reschedule.  ROS chest pain: no   shortness of breath: no   problems with feet: no  2. gout denies any recent gout flares. Doing well on allopurinol 300 mg daily. uric acid was 7.1 in Jan 2011 and allopurinol was increased to 300 mg at that time.  3. hyperlipidemia low HDL and high triglycerides when checked by Dr. Tenny Craw in December. Recommended better glycemic control and exercise.  Habits & Providers  Alcohol-Tobacco-Diet     Tobacco Status: current     Tobacco Counseling: to quit use of tobacco products     Cigarette Packs/Day: <0.25  Current Medications (verified): 1)  Actos 30 Mg Tabs (Pioglitazone Hcl) .... One By Mouth Daily 2)  Allopurinol 300 Mg Tabs (Allopurinol) .... One By Mouth Daily To Prevent Gout 3)  Bayer Childrens Aspirin 81 Mg Chew (Aspirin) .... Take 1 Tablet By Mouth Once A Day 4)  Claritin 10 Mg Tabs (Loratadine) .... Take 1 Tablet By Mouth Once A Day 5)  Glucotrol Xl 10 Mg Tb24 (Glipizide) .... Take 2 Tablet By Mouth Once A Day 6)  Hydrochlorothiazide 25 Mg Tabs (Hydrochlorothiazide) .... Take 1 Tablet By Mouth Once A Day 7)  Dicyclomine Hcl 20 Mg Tabs  (Dicyclomine Hcl) .... One By Mouth Four Times Daily 8)  Flonase 50 Mcg/act  Susp (Fluticasone Propionate) .[name].. 1 Spray Each Nostril Daily 9)  Sertraline Hcl 100 Mg  Tabs (Sertraline Hcl) .[name].. 1 Tab  By Mouth Once Daily 10)  Atenolol 50 Mg  Tabs (Atenolol) .... One By Mouth Daily 11)  Enalapril Maleate 20 Mg  Tabs (Enalapril Maleate) .... One Tablet By Mouth Twice A Day 12)  Amlodipine Besylate 10 Mg  Tabs (Amlodipine Besylate) .... One By Mouth Daily 13)  Nitrostat 0.4 Mg Subl (Nitroglycerin) .... One By Mouth Sublingually As Needed For Chest Pain 14)  Lipitor 10 Mg Tabs (Atorvastatin Calcium) .... One By Mouth Daily For Cholesterol 15)  Chantix Continuing Month Pak 1 Mg Tabs (Varenicline Tartrate) .... Take As Directed  Allergies (verified): No Known Drug Allergies  Social History: Packs/Day:  <0.25  Review of Systems       review of systems as noted in HPI section   Physical Exam  General:  Vital signs reviewed -- obese but otherwise normal Alert, appropriate; well-dressed and well-nourished  Lungs:  work of breathing unlabored, clear to auscultation bilaterally; no wheezes, rales, or ronchi; good air movement throughout  Heart:  regular rate and rhythm, no murmurs; normal s1/s2  Msk:  no joint tenderness, no joint swelling, no joint warmth, and no redness over joints.     Impression & Recommendations:  Problem # 1:  DIABETES MELLITUS II, UNCOMPLICATED (ICD-250.00) Assessment Unchanged a1c at goal. follow-up in nutrition clinic. Continue current meds. Could not tolerate metformin.  Her updated medication list for this problem includes:    Actos 30 Mg Tabs (Pioglitazone hcl) ..... One by mouth daily    Bayer Childrens Aspirin 81 Mg Chew (Aspirin) .[name]... Take 1 tablet by mouth once a day    Glucotrol Xl 10 Mg Tb24 (Glipizide) .[name]... Take 2 tablet by mouth once a day    Enalapril Maleate 20 Mg Tabs (Enalapril maleate) ..... One tablet by mouth twice a day  Orders: A1C-[facility]  (16109) [facility]- Est  Level 4 (60454)  Problem # 2:  GOUT NOS (ICD-274.9) Assessment: Unchanged  check uric acid today. Allopurinol increased in Jan after uric acid came back at 7.1  Her updated medication list for this problem includes:    Allopurinol 300 Mg Tabs (Allopurinol) ..... One by mouth daily to prevent gout  Orders: Uric Acid-[facility] (09811-91478) [facility]- Est  Level 4 (29562)  Problem # 3:  DYSLIPIDEMIA (ICD-272.4) Assessment: Unchanged  check LDL today. Has follow-up in December with Dr. Tenny Craw. Nutrion appointment as already stated in the next 2 weeks. If no improvement would consider starting niacin for low HDL.  Her updated medication list for this problem includes:    Lipitor 10 Mg Tabs (Atorvastatin calcium) ..... One by mouth daily for cholesterol  Orders: Comp Met-[facility] (13086-57846) Direct LDL-[facility] (96295-28413) [facility]- Est  Level 4 (24401)  Complete Medication List: 1)  Actos 30 Mg Tabs (Pioglitazone hcl) .... One by mouth daily 2)  Allopurinol 300 Mg Tabs (Allopurinol) .... One by mouth daily to prevent gout 3)  Bayer Childrens Aspirin 81 Mg Chew (Aspirin) .... Take 1 tablet by mouth once a day 4)  Claritin 10 Mg Tabs (Loratadine) .... Take 1 tablet by mouth once a day 5)  Glucotrol Xl 10 Mg Tb24 (Glipizide) .... Take 2 tablet by mouth once a day 6)  Hydrochlorothiazide 25 Mg Tabs (Hydrochlorothiazide) .... Take 1 tablet by mouth once a day 7)  Dicyclomine Hcl 20 Mg Tabs (Dicyclomine hcl) .... One by mouth four times daily 8)  Flonase 50 Mcg/act Susp (Fluticasone propionate) .[name].. 1 spray each nostril daily 9)  Sertraline Hcl 100 Mg Tabs (Sertraline hcl) .[name].. 1 tab  by mouth once daily 10)  Atenolol 50 Mg Tabs (Atenolol) .... One by mouth daily 11)  Enalapril Maleate 20 Mg Tabs (Enalapril maleate) .... One tablet by mouth twice a day 12)  Amlodipine Besylate 10 Mg Tabs (Amlodipine besylate) .... One by mouth daily 13)  Nitrostat 0.4 Mg Subl (Nitroglycerin) .... One by mouth  sublingually as needed for chest pain 14)  Lipitor 10 Mg Tabs (Atorvastatin calcium) .... One by mouth daily for cholesterol 15)  Chantix Continuing Month Pak 1 Mg Tabs (Varenicline tartrate) .... Take as directed  Patient Instructions: 1)  follow-up with our nutritionist in the next 1-2 months 2)  I'll call you about your blood work 3)  Please think about stopping smoking -- that's probably the most helpful change you could make 4)  follow-up in 3-4 months here  Laboratory Results   Blood Tests   Date/Time Received: September 15, 2009 9:20 AM  Date/Time Reported: September 15, 2009 9:43 AM   HGBA1C: 6.8%   (Normal Range: Non-Diabetic - 3-6%   Control Diabetic -  6-8%)  Comments: ...............test performed by......Marland KitchenBonnie A. [place], MLS (ASCP)cm

## 2010-05-08 NOTE — Assessment & Plan Note (Signed)
Summary: meet new doc/check up/diabetes/el  Medications Added ACTOS 15 MG TABS (PIOGLITAZONE HCL) Take 1 tablet by mouth once a day ALLOPURINOL 100 MG TABS (ALLOPURINOL) 1 tablet by mouth once a day BAYER CHILDRENS ASPIRIN 81 MG CHEW (ASPIRIN) Take 1 tablet by mouth once a day CLARITIN 10 MG TABS (LORATADINE) Take 1 tablet by mouth once a day ENALAPRIL MALEATE 20 MG TABS (ENALAPRIL MALEATE) Take 1 tablet by mouth once a day GLUCOTROL XL 10 MG TB24 (GLIPIZIDE) Take 2 tablet by mouth once a day HYDROCHLOROTHIAZIDE 25 MG TABS (HYDROCHLOROTHIAZIDE) Take 1 tablet by mouth once a day METOPROLOL TARTRATE 100 MG TABS (METOPROLOL TARTRATE) Take 1 tablet by mouth twice a day NABUMETONE 750 MG  TABS (NABUMETONE) two times a day after meals NORCO 5-325 MG  TABS (HYDROCODONE-ACETAMINOPHEN) 1 tab q 6-8 hrs as needed pain TRAZAMINE 50 MG  MISC (TRAZODONE & DIET MANAGE PROD) 1 tab by mouth at bedtime prn ROBAXIN 500 MG  TABS (METHOCARBAMOL) 1 tab daily        Vital Signs:  Patient Profile:   65 Years Old Female Height:     63 inches Weight:      206.3 pounds BMI:     36.68 Temp:     98.7 degrees F Pulse rate:   82 / minute BP sitting:   164 / 83  Pt. in pain?   yes    Location:   shoulders    Intensity:   6  Vitals Entered By: Golden Circle RN (November 18, 2006 3:47 PM)              Is Patient Diabetic? Yes    Chief Complaint:  shoulder pain & blood sugar check.  History of Present Illness: Bonnie Peterson comes in to meet me for the first time and discuss her medications. She is currently on many medications and recently had two more added (Nabumetone an NSAID, and also Norco) for her shoulder and arm pain. She wants to have her sugar checked today. Regarding her elevated BP, pt admits that she forgot to take her BP med today. States that she has been busy going to court to get her disability approved. Otherwise pt states that things are going well. States that her Zoloft is working well and  that her depression is currently controlled. HgbA1c is 6.2 at today's visit.  Of note, pt was sent to lab for a HgbA1C and UA microalbumin and then left the office. This was a miscommunication as we were to further discuss her BP meds after precepting with Dr. Cathey Endow. Accordingly we will try to schedule a f/u appointment in a month to address this area further.    Past Medical History:    Reviewed history from 06/05/2006 and no changes required:       Gout  274  Past Surgical History:    Reviewed history from 06/05/2006 and no changes required:       echo 6/95 -, Hysterectomy--partial -, L rotator cuff repair 11/07-[facility] - 03/14/2006, shoulder surgery w/ Dr. Criss Alvine -, tonsillectomy -   Family History:    Reviewed history from 06/05/2006 and no changes required:       Mom w/ DM, HTN.  Sister w/ DM.  No breast Ca.  Social History:    Reviewed history from 06/05/2006 and no changes required:       Divorced '97.  Lives alone, h/o tobacco.  No etoh.  Applying for disabilty through [facility] (ortho)   Risk Factors:  Counseled to quit/cut down tobacco use:  yes Alcohol use:  no    Physical Exam  General:     Well-developed,well-nourished,in no acute distress; alert,appropriate and cooperative throughout examination Head:     Normocephalic and atraumatic without obvious abnormalities. No apparent alopecia or balding. Chest Wall:     No deformities, masses, or tenderness noted. Lungs:     Normal respiratory effort, chest expands symmetrically. Lungs are clear to auscultation, no crackles or wheezes. Heart:     Normal rate and regular rhythm. S1 and S2 normal without gallop, murmur, click, rub or other extra sounds. Pulses:     R and L carotid,radial,femoral,dorsalis pedis and posterior tibial pulses are full and equal bilaterally Extremities:     No clubbing, cyanosis, edema Neurologic:     Sensation to light touch intact in feet bilaterally Skin:     Intact without suspicious  lesions or rashes Psych:     Cognition and judgment appear intact. Alert and cooperative with normal attention span and concentration. No apparent delusions, illusions, hallucinations    Impression & Recommendations:  Problem # 1:  HYPERTENSION, BENIGN SYSTEMIC (ICD-401.1) Assessment: Unchanged Pt's BP elevated today as it has been for the past 3 visits. Wanted to adjust BP meds today however pt left from lab prematurely before we could finish our visit. Will try to set up follow-up for 1 month. Of note has not taken her BP med today. Consider adding a CCB or increasing her ACEi to 40 mg/day. Pt states that she doesn't want to add anymore medications. Goal BP is 130/70. 2+ microalbumin on UA today. Her updated medication list for this problem includes:    Enalapril Maleate 20 Mg Tabs (Enalapril maleate) .[name]... Take 1 tablet by mouth once a day    Hydrochlorothiazide 25 Mg Tabs (Hydrochlorothiazide) .[name]... Take 1 tablet by mouth once a day    Metoprolol Tartrate 100 Mg Tabs (Metoprolol tartrate) .[name]... Take 1 tablet by mouth twice a day   Problem # 2:  DIABETES MELLITUS II, UNCOMPLICATED (ICD-250.00) Assessment: Unchanged HgbA1c = 6.2 today showing good control. Need to inquire about daily sugar readings. Does not seem to have an neuropathic symptoms in her feet at this time. Continue to follow. Good control  Her updated medication list for this problem includes:    Actos 15 Mg Tabs (Pioglitazone hcl) .[name]... Take 1 tablet by mouth once a day    Bayer Childrens Aspirin 81 Mg Chew (Aspirin) .[name]... Take 1 tablet by mouth once a day    Enalapril Maleate 20 Mg Tabs (Enalapril maleate) .[name]... Take 1 tablet by mouth once a day    Glucotrol Xl 10 Mg Tb24 (Glipizide) .[name]... Take 2 tablet by mouth once a day  Orders: A1C-[facility] (16109) UA Microalbumin-[facility] (60454)   Complete Medication List: 1)  Actos 15 Mg Tabs (Pioglitazone hcl) .... Take 1 tablet by mouth once a day 2)  Allopurinol 100 Mg Tabs  (Allopurinol) .[name].. 1 tablet by mouth once a day 3)  Bayer Childrens Aspirin 81 Mg Chew (Aspirin) .... Take 1 tablet by mouth once a day 4)  Claritin 10 Mg Tabs (Loratadine) .... Take 1 tablet by mouth once a day 5)  Enalapril Maleate 20 Mg Tabs (Enalapril maleate) .... Take 1 tablet by mouth once a day 6)  Glucotrol Xl 10 Mg Tb24 (Glipizide) .... Take 2 tablet by mouth once a day 7)  Hydrochlorothiazide 25 Mg Tabs (Hydrochlorothiazide) .... Take 1 tablet by mouth once a day 8)  Metoprolol Tartrate 100 Mg Tabs (Metoprolol tartrate) .... Take 1 tablet by mouth twice a day 9)  Nabumetone 750 Mg Tabs (Nabumetone) .... Two times a day after meals 10)  Norco 5-325 Mg Tabs (Hydrocodone-acetaminophen) .[name].. 1 tab q 6-8 hrs as needed pain 11)  Trazamine 50 Mg Misc (Trazodone & diet manage prod) .[name].. 1 tab by mouth at bedtime prn 12)  Robaxin 500 Mg Tabs (Methocarbamol) .[name].. 1 tab daily   Patient Instructions: 1)  Not able to provide instructions b/c pt left from lab before visit could be finished.          Vital Signs:  Patient Profile:   65 Years Old Female Height:     63 inches Weight:      206.3 pounds BMI:     36.68 Temp:     98.7 degrees F Pulse rate:   82 / minute BP sitting:   164 / 83    Location:   shoulders    Intensity:   6              Laboratory Results   Urine Tests  Date/Time Recieved: November 18, 2006 4:18  PM  Date/Time Reported: November 18, 2006 4:37 PM  Microalbumin (urine): 2+ mg/L    Comments: ...............test performed by......Marland KitchenBonnie A. [place], [place] (ASCP)   Blood Tests   Date/Time Recieved: November 18, 2006 4:18 PM  Date/Time Reported: November 18, 2006 4:38 PM   HGBA1C: 6.2%   (Normal Range: Non-Diabetic - 3-6%   Control Diabetic - 6-8%)  Comments: ...............test performed by......Marland KitchenBonnie A. [place], [place] (ASCP)       Appended Document: Orders Update    Clinical Lists Changes  Orders: Added new Test order of [facility]- Est Level  3  (54098) - Signed

## 2010-05-08 NOTE — Progress Notes (Signed)
Summary: triage  Phone Note Call from Patient Call back at Home Phone [phone]   Caller: Patient Summary of Call: having flu like symptoms and wondering what she can take.  Initial call taken by: Raymond Gurney,  February 17, 2009 8:39 AM  Follow-up for Phone Call        c/o chills, cough, fatigue, poor appetite, denies HA. took motrin & Diabetic cough syrup. does not want appt. gave her list of approved cold meds for people with HTN. to drink plenty of fluids. take temp. call if worse, if temp does not go down with tyl or ibu. states sugars have been in 120 range Follow-up by: Elige Radon RN,  February 17, 2009 8:53 AM

## 2010-05-08 NOTE — Assessment & Plan Note (Signed)
Summary: f1y per Jackie/jss   Referring Provider:  Dorris Carnes Primary Provider:  [name]  MD  CC:  headache and sometimes alittle chest pains.  History of Present Illness: Patient is a 65 year old with a history of hypertension, diabetess, tobacco use.  She was recently admitted to [facility] on 10/7 with chest paine.  The spell lasted a almost 12 hrs.  Associated with shortness of breath.  Enzyms negative.  Echo with LVH, normal LV function.  Mild AS, mild MR Patient was seen in clinic in December.  She had a myoview that showed normal perfusion. A lipid pane was done that showed LDL that was good.  HDL was low, triglycerides were high.  Rec dietary adjustmants and cont'd f/u. since I saw her she denies chest pains.  Breathing is OK.  She is down to 6 cigs per day.  Frustrated that she is not losing wt.  Is not exercising regularly.  Current Medications (verified): 1)  Actos 30 Mg Tabs (Pioglitazone Hcl) .... One By Mouth Daily 2)  Allopurinol 300 Mg Tabs (Allopurinol) .... One By Mouth Daily To Prevent Gout 3)  Bayer Childrens Aspirin 81 Mg Chew (Aspirin) .... Take 1 Tablet By Mouth Once A Day 4)  Claritin 10 Mg Tabs (Loratadine) .... Take 1 Tablet By Mouth Once A Day 5)  Glucotrol Xl 10 Mg Tb24 (Glipizide) .... Take 2 Tablet By Mouth Once A Day 6)  Hydrochlorothiazide 25 Mg Tabs (Hydrochlorothiazide) .... Take 1 Tablet By Mouth Once A Day 7)  Dicyclomine Hcl 20 Mg Tabs (Dicyclomine Hcl) .... One By Mouth Four Times Daily 8)  Flonase 50 Mcg/act  Susp (Fluticasone Propionate) .[name].. 1 Spray Each Nostril Daily 9)  Sertraline Hcl 100 Mg  Tabs (Sertraline Hcl) .[name].. 1 Tab  By Mouth Once Daily 10)  Atenolol 50 Mg  Tabs (Atenolol) .... One By Mouth Daily 11)  Enalapril Maleate 20 Mg  Tabs (Enalapril Maleate) .... One Tablet By Mouth Twice A Day 12)  Guaifenesin-Codeine 100-10 Mg/17m Liqd (Guaifenesin-Codeine) .... Take 1-2 Teaspoons Every 6 Hours As Needed For Cough; Disp 60 Cc 13)   Flexeril 5 Mg  Tabs (Cyclobenzaprine Hcl) .... Take One By Mouth At Bedtime As Needed For Back Pain 14)  Amlodipine Besylate 10 Mg  Tabs (Amlodipine Besylate) .... One By Mouth Daily 15)  Nitrostat 0.4 Mg Subl (Nitroglycerin) .... One By Mouth Sublingually As Needed For Chest Pain 16)  Lipitor 10 Mg Tabs (Atorvastatin Calcium) .... One By Mouth Daily For Cholesterol 17)  Chantix Continuing Month Pak 1 Mg Tabs (Varenicline Tartrate) .... Take As Directed  Allergies: No Known Drug Allergies  Past History:  Past medical, surgical, family and social histories (including risk factors) reviewed, and no changes noted (except as noted below).  Past Medical History: Reviewed history from 08/02/2009 and no changes required. Current Problems:  HYPERTENSION, BENIGN SYSTEMIC (ICD-401.1) CHEST PAIN (ICD-786.50) SLEEP APNEA (ICD-780.57) HYPERTENSION (ICD-401.9) DYSLIPIDEMIA (ICD-272.4) OBESITY, UNSPECIFIED (ICD-278.00) OBSTRUCTIVE SLEEP APNEA (ICD-327.23) - on CPAP per Dr. CGwenette GreetRESTRICTIVE LUNG DISEASE (ICD-518.89) DYSPNEA/SHORTNESS OF BREATH (ICD-786.09) HIP PAIN, BILATERAL (ICD-719.45) BENIGN POSITIONAL VERTIGO (ICD-386.11) SCIATICA, RIGHT (ICD-724.3) VACCINE AGAINST INFLUENZA (ICD-V04.81) GOUT NOS (ICD-274.9) SEBORRHEIC KERATOSIS (ICD-702.19) TOBACCO DEPENDENCE (ICD-305.1) RHINITIS, ALLERGIC (ICD-477.9) OSTEOARTHRITIS, MULTI SITES (ICD-715.98) MENOPAUSAL SYNDROME (ICD-627.2) IRRITABLE BOWEL SYNDROME (ICD-564.1) DIABETES MELLITUS II, UNCOMPLICATED (IHER-740.81 DEPRESSION, MAJOR, RECURRENT (ICD-296.30) ANXIETY (ICD-300.00) mild aortic regurg, mild MR.         Past Surgical History: Reviewed history from 04/27/2009 and no changes required. Hysterectomy--partial -,  L rotator cuff repair 11/07-[facility] - 03/14/2006,  R shoulder surgery w/ Dr. Jenean Lindau -,  tonsillectomy - B feet surgery  echo 10/10: mild pulm HTN, EF 60-65%, mild LVH, moderate aortic regurg  Family  History: Reviewed history from 04/27/2009 and no changes required. heart disease: mother (M.I.) rheumatism: mother cancer: mother (breast), sister (pancreatic)  Mother: HTN, DM<  sister: Dm  Social History: Reviewed history from 04/27/2009 and no changes required. Divorced '97 and now single.   Lives alone,  pt has 4 children. smokes 1/2 ppd.  started at age 65. No etoh.  Currently disabled.  Previously worked as a [employer]arry manager.  Has trouble affording medications. Recently moved to 1st floor apartment housing (end of June 2010). Likes it there.  Grandson recently sent to jail - (38 - 42 months) -- this is stressful for the patient.  Vital Signs:  Patient profile:   65 year old female Height:      62 inches Weight:      222 pounds BMI:     40.75 Pulse rate:   67 / minute Resp:     14 per minute BP sitting:   124 / 69  (left arm)  Vitals Entered By: Burnett Kanaris (August 04, 2009 10:37 AM)  Physical Exam  Additional Exam:  patient is in NAD HEENT:  Normocephalic, atraumatic. EOMI, PERRLA.  Neck: JVP is normal. No thyromegaly. No bruits.  Lungs: clear to auscultation. No rales no wheezes.  Heart: Regular rate and rhythm. Normal S1, S2. No S3.  Gr. 0-2/3 systolic murmur at base consistent with aortic sclerosis. PMI not displaced.  Abdomen:  Supple, nontender. Normal bowel sounds. No masses. No hepatomegaly.  Extremities:   Good distal pulses throughout. No lower extremity edema.  Musculoskeletal :moving all extremities.  Neuro:   alert and oriented x3.    EKG  Procedure date:  08/04/2009  Findings:      NSR.  67 bpm.  First degree AV block.  Impression & Recommendations:  Problem # 1:  CHEST PAIN (ICD-786.50) No complainyts.  Normal myoview.  Continue risk factor modification.  Problem # 2:  HYPERTENSION, BENIGN SYSTEMIC (ICD-401.1) Better.  She is using CPAP which I think is helping.  Problem # 3:  SLEEP APNEA (ICD-780.57) On CPAP  Problem # 4:  DYSLIPIDEMIA  (ICD-272.4) Working on diet, exercise.  LDL is good, HDL is low.  TRIgs are high  WIll f/u next winter. Her updated medication list for this problem includes:    Lipitor 10 Mg Tabs (Atorvastatin calcium) ..... One by mouth daily for cholesterol  Problem # 5:  OBESITY, UNSPECIFIED (ICD-278.00) Encouraged continued diet and adding exercise.  Other Orders: EKG w/ Interpretation (93000)  Appended Document: f1y per Jackie/jss Patient still smoking   Down to 6 per day.  Congratulated on progress.  Encouraged her to continue to try to quit.

## 2010-05-08 NOTE — Miscellaneous (Signed)
Summary: GYN REFERRAL  Clinical Lists Changes  Appt. scheduled for 06/22/07 @ 4pm w/Dr. Ruthann Cancer.  Pt notified.  Records faxed to [phone].

## 2010-05-08 NOTE — Progress Notes (Signed)
Summary: Paperwork  Phone Note Other Incoming Call back at [phone]   Call placed by: [employer] of Call: Needs to know if we have received paperwork for yearly evaluation. Initial call taken by: Haydee Salter,  February 02, 2007 9:27 AM  Follow-up for Phone Call        This is not in your box up front, do you happen to remember seeing this? Follow-up by: Jone Baseman CMA,  February 03, 2007 12:00 PM  Additional Follow-up for Phone Call Additional follow up Details #1::        Filled out this form yesterday. Gave to Amy, I think. Thanks. Additional Follow-up by: Myrtie Soman  MD,  February 03, 2007 12:04 PM    Additional Follow-up for Phone Call Additional follow up Details #2::    The form Dr. Rexene Alberts filled out for me was for [employer] for pt's diabetes supplies.   Follow-up by: Jacki Cones RN,  February 03, 2007 2:03 PM  Additional Follow-up for Phone Call Additional follow up Details #3:: Details for Additional Follow-up Action Taken: Then I have not received this form. I have not seen it in my box, either. Thanks. Additional Follow-up by: Myrtie Soman  MD,  February 05, 2007 10:45 AM  [facility] homecare, they will refax forms ...................................................................JESSICA FLEEGER CMA  February 05, 2007 11:39 AM

## 2010-05-08 NOTE — Assessment & Plan Note (Signed)
Summary: SMOKING CESS/KH   Primary Care Provider:  Myrtie Soman  MD   History of Present Illness: Patient arrives for group tobacco cessation class.  Currently continues to smoke 1/2 pack per day.      Habits & Providers  Alcohol-Tobacco-Diet     Tobacco Status: current     Pack years: 19.50  Allergies (verified): No Known Drug Allergies   Impression & Recommendations:  Problem # 1:  TOBACCO DEPENDENCE (ICD-305.1)   Moderate  Nicotine Abuse of:38 years duration in a patient who is:  good candidate for success b/c of: her current level of motivation after spirometry evaluation. Initiated varenicline. Counseled on purpose, proper use, and potential adverse effects, including nausea.  Cautioned regarding CNS effects.  Advised to call office if any mood changes or suicidal ideation.  Written information provided:  benefits of quitting sheet. F/U Rx group class in 1 month.  She will start Chantix on Feb 5th and her quit date is Feb 15th.   Orders: Smoking Cessation Class (O7564)  Her updated medication list for this problem includes:    Chantix Starting Month Pak 0.5 Mg X 11 & 1 Mg X 42 Tabs (Varenicline tartrate) .[name]... Take as directed.  Complete Medication List: 1)  Actos 30 Mg Tabs (Pioglitazone hcl) .... One by mouth daily 2)  Allopurinol 200 Mg Tab  .... Take 1 tab by mouth daily for gout 3)  Bayer Childrens Aspirin 81 Mg Chew (Aspirin) .... Take 1 tablet by mouth once a day 4)  Claritin 10 Mg Tabs (Loratadine) .... Take 1 tablet by mouth once a day 5)  Glucotrol Xl 10 Mg Tb24 (Glipizide) .... Take 2 tablet by mouth once a day 6)  Hydrochlorothiazide 25 Mg Tabs (Hydrochlorothiazide) .... Take 1 tablet by mouth once a day 7)  Trazamine 50 Mg Misc (Trazodone & diet manage prod) .[name].. 1 tab by mouth at bedtime prn 8)  Dicyclomine Hcl 20 Mg Tabs (Dicyclomine hcl) .... One by mouth four times daily 9)  Flonase 50 Mcg/act Susp (Fluticasone propionate) .[name].. 1 spray each nostril  daily 10)  Sertraline Hcl 100 Mg Tabs (Sertraline hcl) .... Two tabs by mouth daily 11)  Atenolol 50 Mg Tabs (Atenolol) .... One by mouth daily 12)  Enalapril Maleate 20 Mg Tabs (Enalapril maleate) .... One tablet by mouth twice a day 13)  Guaifenesin-codeine 100-10 Mg/71ml Liqd (Guaifenesin-codeine) .... Take 1-2 teaspoons every 6 hours as needed for cough; disp 60 cc 14)  Flexeril 5 Mg Tabs (Cyclobenzaprine hcl) .... Take one by mouth at bedtime as needed for back pain 15)  Amlodipine Besylate 10 Mg Tabs (Amlodipine besylate) .... One by mouth daily 16)  Ultram 50 Mg Tabs (Tramadol hcl) .... One by mouth every 6 hours 17)  Nitrostat 0.4 Mg Subl (Nitroglycerin) .... One by mouth sublingually as needed for chest pain 18)  Lipitor 10 Mg Tabs (Atorvastatin calcium) .... One by mouth daily for cholesterol 19)  Chantix Starting Month Pak 0.5 Mg X 11 & 1 Mg X 42 Tabs (Varenicline tartrate) .... Take as directed. Prescriptions: CHANTIX STARTING MONTH PAK 0.5 MG X 11 & 1 MG X 42 TABS (VARENICLINE TARTRATE) Take as directed.  #1 x 0   Entered by:   Christian Mate D   Authorized by:   [name] FPCDEFAULTPROVIDER   Signed by:   Christian Mate D on 05/03/2009   Method used:   Print then Give to Patient   RxID:   [id number]   Tobacco  Counseling   Currently uses tobacco.    Cigarettes      Year started smoking cigarettes:      1972     Number of packs per day smoked:      0.5     Years smoked:            39     Packs per year:          182.50     Pack Years:            19.50   Readiness to Quit:     Very Ready Cessation Stage:     Action Target Quit Date:     05/23/2009  Quitting Barriers:      -  feels addicted  Quitting Motivators:      -  COPD  Previous Quit Attempts   Previously Tried to quit:     Yes Comments: to quit use of tobacco products  Smoking Cessation Plan   Readiness:     Very Ready Cessation Stage:   Action Target Quit Date:   05/23/2009 New  Medications: CHANTIX STARTING MONTH PAK 0.5 MG X 11 & 1 MG X 42 TABS (VARENICLINE TARTRATE) Take as directed.  Medications Added this Update:  CHANTIX STARTING MONTH PAK 0.5 MG X 11 & 1 MG X 42 TABS (VARENICLINE TARTRATE) Take as directed.  Orders Added this Update:  Smoking Cessation Class [S9453]    Appended Document: SMOKING CESS - Group class    Clinical Lists Changes  Medications: Changed medication from CHANTIX STARTING MONTH PAK 0.5 MG X 11 & 1 MG X 42 TABS (VARENICLINE TARTRATE) Take as directed. to CHANTIX STARTING MONTH PAK 0.5 MG X 11 & 1 MG X 42 TABS (VARENICLINE TARTRATE) Take as directed. Dispense one month - starting dose pack. - Signed Rx of CHANTIX STARTING MONTH PAK 0.5 MG X 11 & 1 MG X 42 TABS (VARENICLINE TARTRATE) Take as directed. Dispense one month - starting dose pack.;  #1 x 0;  Signed;  Entered by: Christian Mate D;  Authorized by: Myrtie Soman  MD;  Method used: Faxed to [facility], [address], [place], [place]  [address], Ph: [phone], Fax: [phone]    Prescriptions: CHANTIX STARTING MONTH PAK 0.5 MG X 11 & 1 MG X 42 TABS (VARENICLINE TARTRATE) Take as directed. Dispense one month - starting dose pack.  #1 x 0   Entered by:   Madelon Lips Pharm D   Authorized by:   Myrtie Soman  MD   Signed by:   Madelon Lips Pharm D on 05/15/2009   Method used:   Faxed to ...       [facility] (retail)       [address]       [address]       [place], [place]  [address]       Ph: [phone]       Fax: [phone]   RxID:   [id number]

## 2010-05-08 NOTE — Assessment & Plan Note (Signed)
Summary: reschedule from 9/8/eo   Vital Signs:  Patient profile:   65 year old female Height:      63 inches Weight:      218.3 pounds BMI:     38.81 Temp:     98.1 degrees F oral Pulse rate:   62 / minute BP sitting:   135 / 80  (left arm) Cuff size:   large  Vitals Entered By: Levert Feinstein LPN (January 05, 4195 11:13 AM)  CC: f/u dm Is Patient Diabetic? Yes  Pain Assessment Patient in pain? yes     Location: knees   Primary Care Provider:  [name]  MD  CC:  f/u dm.  History of Present Illness: 1. Gout Uric acid 8.7 last month. Pt has been taking allopurinol at increased dose (200 mg) as directed. Insurance won't pay for colchicine for acute flares per the patient.   2. diabetes Blood sugar log shows most readings in 120s to 150s. Pt is taking actos and glipizide without problems. Last a1c was 7.0 8/10.   3. smoking Pt reported that she was committed to smoking last visit but has not been able to kick the habit due to stress. Had trouble with chantix due to anxiety previously but states she may give this another shot if she's not able to quit by December. Pt states that she does not need to use albuterol but continues to have a chronic cough.    Habits & Providers  Alcohol-Tobacco-Diet     Tobacco Status: current  Current Medications (verified): 1)  Actos 30 Mg Tabs (Pioglitazone Hcl) .... One By Mouth Daily 2)  Allopurinol 100 Mg Tabs (Allopurinol) .... Take 2 Tabs By Mouth Daily 3)  Bayer Childrens Aspirin 81 Mg Chew (Aspirin) .... Take 1 Tablet By Mouth Once A Day 4)  Claritin 10 Mg Tabs (Loratadine) .... Take 1 Tablet By Mouth Once A Day 5)  Glucotrol Xl 10 Mg Tb24 (Glipizide) .... Take 2 Tablet By Mouth Once A Day 6)  Hydrochlorothiazide 25 Mg Tabs (Hydrochlorothiazide) .... Take 1 Tablet By Mouth Once A Day 7)  Trazamine 50 Mg  Misc (Trazodone & Diet Manage Prod) .[name].. 1 Tab By Mouth At Bedtime Prn 8)  Dicyclomine Hcl 20 Mg Tabs (Dicyclomine Hcl)  .... One By Mouth Four Times Daily 9)  Flonase 50 Mcg/act  Susp (Fluticasone Propionate) .[name].. 1 Spray Each Nostril Daily 10)  Sertraline Hcl 100 Mg  Tabs (Sertraline Hcl) .... Two Tabs By Mouth Daily 11)  Atenolol 50 Mg  Tabs (Atenolol) .... One By Mouth Daily 12)  Enalapril Maleate 20 Mg  Tabs (Enalapril Maleate) .... One Tablet By Mouth Twice A Day 13)  Tussionex Pennkinetic Er 8-10 Mg/25m  Lqcr (Chlorpheniramine-Hydrocodone) .... One Teaspoon Every 12 Hours As Needed For Cough. Dispense 60 Ml or Standard Amount 14)  Flexeril 5 Mg  Tabs (Cyclobenzaprine Hcl) .... Take One By Mouth At Bedtime As Needed For Back Pain 15)  Amlodipine Besylate 10 Mg  Tabs (Amlodipine Besylate) .... One By Mouth Daily 16)  Ultram 50 Mg Tabs (Tramadol Hcl) .... One By Mouth Every 6 Hours 17)  Ventolin Hfa 108 (90 Base) Mcg/act Aers (Albuterol Sulfate) .... 2 Puffs Every Four Hours As Needed  Allergies (verified): No Known Drug Allergies  Review of Systems       denies chest pain, shortness of breath. Does have occasional numbness/tingling in feet when "I eat too much sugar."  Physical Exam  General:  General:  Vital signs  reviewed Alert, appropriate; well-dressed and well-nourished Neck:  no carotid bruits, no JVD, no tenderness or masses Lungs:  work of breathing unlabored, clear to auscultation bilaterally; no wheezes, rales, or ronchi; good air movement throughout Heart:  regular rate and rhythm, no murmurs; normal s1/s2 Pulses:  DP and radial pulses 2+ bilaterally  Extremities:  no cyanosis, clubbing, or edema Neurologic:  alert and oriented. speech normal.   Diabetes Management Exam:    Foot Exam (with socks and/or shoes not present):       Sensory-Pinprick/Light touch:          Left medial foot (L-4): normal          Left dorsal foot (L-5): normal          Left lateral foot (S-1): normal          Right medial foot (L-4): normal          Right dorsal foot (L-5): normal          Right lateral  foot (S-1): normal       Sensory-Monofilament:          Left foot: normal          Right foot: normal       Inspection:          Left foot: normal          Right foot: normal       Nails:          Left foot: normal          Right foot: normal   Impression & Recommendations:  Problem # 1:  GOUT NOS (ICD-274.9) Assessment Unchanged check Cr and uric acid today. Goal uric acid less than 7. Advised pt to call pharmacy to see if there is a colchicine they will pay for (i.e. brand name).   Her updated medication list for this problem includes:    Allopurinol 100 Mg Tabs (Allopurinol) .[name]... Take 2 tabs by mouth daily  Orders: [facility]- Est  Level 4 (99214)Future Orders: Basic Met-[facility] ([id number]) ... 01/04/2010 Uric Acid-[facility] ([id number]) ... 01/04/2010  Problem # 2:  DIABETES MELLITUS II, UNCOMPLICATED (FFM-384.66) Assessment: Unchanged  Monofilament done. Check A1c next visit. continue current regimen. No hypoglycemia. Consider addition of metformin if A1c creeps up, but I wonder if pt will tolerate due to GI issues (IBS).   Her updated medication list for this problem includes:    Actos 30 Mg Tabs (Pioglitazone hcl) ..... One by mouth daily    Bayer Childrens Aspirin 81 Mg Chew (Aspirin) .[name]... Take 1 tablet by mouth once a day    Glucotrol Xl 10 Mg Tb24 (Glipizide) .[name]... Take 2 tablet by mouth once a day    Enalapril Maleate 20 Mg Tabs (Enalapril maleate) ..... One tablet by mouth twice a day  Orders: [place]- Est  Level 4 (59935)  Problem # 3:  TOBACCO DEPENDENCE (ICD-305.1) Assessment: Unchanged  Has not quit. consider chantix. Consider smoking cessation clinic/class. Pt is contemplative but does not seem fully committed to stopping.   Orders: [place]- Est  Level 4 ([address]214)  Complete Medication List: 1)  Actos 30 Mg Tabs (Pioglitazone hcl) .... One by mouth daily 2)  Allopurinol 100 Mg Tabs (Allopurinol) .... Take 2 tabs by mouth daily 3)  Bayer Childrens Aspirin 81 Mg Chew  (Aspirin) .... Take 1 tablet by mouth once a day 4)  Claritin 10 Mg Tabs (Loratadine) .... Take 1 tablet by mouth once a day 5)  Glucotrol Xl 10 Mg Tb24 (Glipizide) .... Take 2 tablet by mouth once a day 6)  Hydrochlorothiazide 25 Mg Tabs (Hydrochlorothiazide) .... Take 1 tablet by mouth once a day 7)  Trazamine 50 Mg Misc (Trazodone & diet manage prod) .[name].. 1 tab by mouth at bedtime prn 8)  Dicyclomine Hcl 20 Mg Tabs (Dicyclomine hcl) .... One by mouth four times daily 9)  Flonase 50 Mcg/act Susp (Fluticasone propionate) .[name].. 1 spray each nostril daily 10)  Sertraline Hcl 100 Mg Tabs (Sertraline hcl) .... Two tabs by mouth daily 11)  Atenolol 50 Mg Tabs (Atenolol) .... One by mouth daily 12)  Enalapril Maleate 20 Mg Tabs (Enalapril maleate) .... One tablet by mouth twice a day 13)  Tussionex Pennkinetic Er 8-10 Mg/37m Lqcr (Chlorpheniramine-hydrocodone) .... One teaspoon every 12 hours as needed for cough. dispense 60 ml or standard amount 14)  Flexeril 5 Mg Tabs (Cyclobenzaprine hcl) .... Take one by mouth at bedtime as needed for back pain 15)  Amlodipine Besylate 10 Mg Tabs (Amlodipine besylate) .... One by mouth daily 16)  Ultram 50 Mg Tabs (Tramadol hcl) .... One by mouth every 6 hours 17)  Ventolin Hfa 108 (90 Base) Mcg/act Aers (Albuterol sulfate) .... 2 puffs every four hours as needed  Patient Instructions: 1)  Call your insurance company to see what gout medicines they'll pay for.  2)  STOP the allopurinol when you have a gout attack. Take the aleve. Call if your pain is not controlled and we can do a short-term supply of colchicine. 3)  follow-up in 2-3 months. We need to check your cholesterol in the next few months.  4)  call back in then next 1-2 weeks about the flu shot.    Prevention & Chronic Care Immunizations   Influenza vaccine: Fluvax MCR  (12/28/2007)   Influenza vaccine due: 12/27/2008    Tetanus booster: 06/06/2004: Done.   Tetanus booster due: 06/07/2014     Pneumococcal vaccine: Done.  (03/08/2004)   Pneumococcal vaccine due: None    H. zoster vaccine: 03/08/2008: .  Colorectal Screening   Hemoccult: Done.  (09/07/2003)   Hemoccult due: Not Indicated    Colonoscopy: normal  (04/19/2004)   Colonoscopy due: 04/19/2014  Other Screening   Pap smear: normal  (06/23/2007)   Pap smear due: 06/23/2010    Mammogram: ASSESSMENT: Negative - BI-RADS 1^MM DIGITAL SCREENING  (12/30/2008)   Mammogram due: 12/21/2008    DXA bone density scan: Not documented   Smoking status: current  (01/04/2009)   Smoking cessation counseling: yes  (12/28/2007)  Diabetes Mellitus   HgbA1C: 7.0  (11/11/2008)   Hemoglobin A1C due: 01/20/2008    Eye exam: normal  (11/14/2008)   Eye exam due: 11/15/2008    Foot exam: yes  (01/04/2009)   Foot exam action/deferral: Do today   High risk foot: Not documented   Foot care education: Not documented   Foot exam due: 12/27/2008    Urine microalbumin/creatinine ratio: Not documented   Urine microalbumin/cr due: 11/26/2008    Diabetes flowsheet reviewed?: Yes   Progress toward A1C goal: At goal  Lipids   Total Cholesterol: 198  (10/20/2007)   LDL: See Comment mg/dL  (10/20/2007)   LDL Direct: 88  (11/23/2008)   HDL: 30  (10/20/2007)   Triglycerides: 414  (10/20/2007)    SGOT (AST): 14  (11/23/2008)   SGPT (ALT): 14  (11/23/2008)   Alkaline phosphatase: 103  (11/23/2008)   Total bilirubin: 0.4  (11/23/2008)  Lipid flowsheet reviewed?: Yes   Progress toward LDL goal: Unchanged  Hypertension   Last Blood Pressure: 135 / 80  (01/04/2009)   Serum creatinine: 0.84  (11/23/2008)   Serum potassium 4.4  (11/23/2008)    Hypertension flowsheet reviewed?: Yes   Progress toward BP goal: Unchanged  Self-Management Support :   Personal Goals (by the next clinic visit) :     Personal A1C goal: 7  (01/04/2009)     Personal blood pressure goal: 130/80  (01/04/2009)     Personal LDL goal: 70  (01/04/2009)     Diabetes self-management support: Not documented    Hypertension self-management support: Not documented    Lipid self-management support: Not documented    Nursing Instructions: Diabetic foot exam today    Diabetic Foot Exam    10-g (5.07) Semmes-Weinstein Monofilament Test           Right Foot          Left Foot Visual Inspection               Test Control      normal         normal Site 1         normal         normal Site 2         normal         normal Site 3         normal         normal Site 4         normal         normal Site 5         normal         normal Site 6         normal         normal Site 7         normal         normal Site 8         normal         normal Site 9         normal         normal Site 10         normal         normal  Impression      normal         normal

## 2010-05-08 NOTE — Assessment & Plan Note (Signed)
Summary: dm wp   Vital Signs:  Patient Profile:   65 Years Old Female Height:     63 inches Weight:      217.2 pounds Temp:     98.5 degrees F Pulse rate:   76 / minute BP sitting:   153 / 86  (left arm)  Pt. in pain?   yes    Location:   knees    Intensity:   2    Type:       aching  Vitals Entered ByJacki Cones RN (July 13, 2007 10:41 AM)                  PCP:  Myrtie Soman  MD  Chief Complaint:  f/u diabetes and bp.  History of Present Illness: 65 yo female with DM and HTN who presents for follow-up. Not having other problems.  1. Diabetes BS log shows that pt's sugars are mostly in the 90's to 140's. No outliers or values in 200's or 300's. Shows decent control. Has been watching sweets. Denies polyuria/polydipsia. Would consider trying metformin again in the future if it helped with weight loss.  2. HTN Taking meds regularly. Occasional HA 1-2 per week for a couple of hours. No problems with speech/coordination/gait. Denies CP or difficulty breathing. Would be open to adding another medication if it would help BP.  3. Smoking Pt not ready to quit today. Knows that she should. Would want to quit "cold-turkey" if she did decide to.        Physical Exam  General:     alert, well-nourished, well-hydrated, and overweight-appearing.   Head:     normocephalic and atraumatic.   Lungs:     Normal respiratory effort, chest expands symmetrically. Lungs are clear to auscultation, no crackles or wheezes. Heart:     Normal rate and regular rhythm. S1 and S2 normal without gallop, murmur, click, rub or other extra sounds. Abdomen:     Bowel sounds positive,abdomen soft and non-tender without masses, organomegaly or hernias noted. Msk:     Normal posture. Rises from chair reasonably well but states she has problems sometimes. Pulses:     2+ dP pulses Extremities:     No clubbing, cyanosis, trace LE edema Neurologic:     Grossly intact. Speech articulate.  Normal gait. Skin:     Intact without suspicious lesions or rashes Psych:     Cognition and judgment appear intact. Alert and cooperative with normal attention span and concentration. No apparent delusions, illusions, hallucinations    Impression & Recommendations:  Problem # 1:  DIABETES MELLITUS II, UNCOMPLICATED (ICD-250.00) Log indicates reasonable control. Will consider A1C at next visit. Consider metformin at next visit or increase of actos to 30 mg.  No polyuria/polydipsia. Will check lipids and lab visit, also UA glucose/protein. The following medications were removed from the medication list:    Enalapril-hydrochlorothiazide 5-12.5 Mg Tabs (Enalapril-hydrochlorothiazide) .[name]... Take 2 tabs by mouth daily  Her updated medication list for this problem includes:    Actos 15 Mg Tabs (Pioglitazone hcl) .[name]... Take 1 tablet by mouth once a day    Bayer Childrens Aspirin 81 Mg Chew (Aspirin) .[name]... Take 1 tablet by mouth once a day    Glucotrol Xl 10 Mg Tb24 (Glipizide) .[name]... Take 2 tablet by mouth once a day    Enalapril Maleate 20 Mg Tabs (Enalapril maleate) ..... One tablet by mouth twice a day  Orders: [facility]- Est Level  3 (25427)  Future Orders: UA Glucose/Protein-[facility] ([id number]) ... 07/12/2008 Lipid-[facility] ([id number]) ... 07/12/2008   Problem # 2:  HYPERTENSION, BENIGN SYSTEMIC (ICD-401.1) Still elevated although not as bad as it has been (but up from last visit). Will maximize enalalpril to 40 mg daily and keep HCTZ at 25 mg. Follow-up in 2 months. Consider adding CCB to regimen as each med is maxed out at this point. No symptoms of hypertensive emergency. Will check UA microalbumin at lab visit. The following medications were removed from the medication list:    Enalapril-hydrochlorothiazide 5-12.5 Mg Tabs (Enalapril-hydrochlorothiazide) .[name]... Take 2 tabs by mouth daily  Her updated medication list for this problem includes:    Hydrochlorothiazide 25 Mg Tabs (Hydrochlorothiazide)  .[name]... Take 1 tablet by mouth once a day    Atenolol 100 Mg Tabs (Atenolol) .[name]... Take one by mouth daily    Enalapril Maleate 20 Mg Tabs (Enalapril maleate) ..... One tablet by mouth twice a day  Orders: [facility]- Est Level  3 (09811)  Future Orders: UA Microalbumin-[facility] ([id number]) ... 07/12/2008   Problem # 3:  TOBACCO DEPENDENCE (ICD-305.1) Assessment: Unchanged Advised to quit. Pt precontemplative at this point.  Complete Medication List: 1)  Actos 15 Mg Tabs (Pioglitazone hcl) .... Take 1 tablet by mouth once a day 2)  Allopurinol 100 Mg Tabs (Allopurinol) .[name].. 1 tablet by mouth once a day 3)  Bayer Childrens Aspirin 81 Mg Chew (Aspirin) .... Take 1 tablet by mouth once a day 4)  Claritin 10 Mg Tabs (Loratadine) .... Take 1 tablet by mouth once a day 5)  Glucotrol Xl 10 Mg Tb24 (Glipizide) .... Take 2 tablet by mouth once a day 6)  Hydrochlorothiazide 25 Mg Tabs (Hydrochlorothiazide) .... Take 1 tablet by mouth once a day 7)  Trazamine 50 Mg Misc (Trazodone & diet manage prod) .[name].. 1 tab by mouth at bedtime prn 8)  Robaxin 500 Mg Tabs (Methocarbamol) .[name].. 1 tab daily 9)  Hyomax-sl 0.125 Mg Subl (Hyoscyamine sulfate) .... 2 tabs by mouth once daily 10)  Flonase 50 Mcg/act Susp (Fluticasone propionate) .[name].. 1 spray each nostril daily 11)  Sertraline Hcl 100 Mg Tabs (Sertraline hcl) .... Two tabs by mouth daily 12)  Atenolol 100 Mg Tabs (Atenolol) .... Take one by mouth daily 13)  Enalapril Maleate 20 Mg Tabs (Enalapril maleate) .... One tablet by mouth twice a day 14)  Tussionex Pennkinetic Er 8-10 Mg/51ml Lqcr (Chlorpheniramine-hydrocodone) .... One teaspoon every 12 hours as needed for cough. dispense 60 ml or standard amount   Patient Instructions: 1)  Stop taking the HCTZ/enalpril combination pill. I want you take just HCTZ 25 mg once a day and enalapril 40 mg once a day. 2)  Schedule a lab appointment for next week. 3)  Follow-up in 2-3 months and we'll see how you are  doing.    Prescriptions: ENALAPRIL MALEATE 20 MG  TABS (ENALAPRIL MALEATE) one tablet by mouth twice a day  #60 x 3   Entered and Authorized by:   Myrtie Soman  MD   Signed by:   Myrtie Soman  MD on 07/13/2007   Method used:   Print then Give to Patient   RxID:   [id number] HYDROCHLOROTHIAZIDE 25 MG TABS (HYDROCHLOROTHIAZIDE) Take 1 tablet by mouth once a day  #30 x 2   Entered and Authorized by:   Myrtie Soman  MD   Signed by:   Myrtie Soman  MD on 07/13/2007   Method used:   Print then Give to Patient  RxID:   [id number] ENALAPRIL MALEATE 20 MG  TABS (ENALAPRIL MALEATE) one tablet by mouth twice a day  #60 x 3   Entered and Authorized by:   Myrtie Soman  MD   Signed by:   Myrtie Soman  MD on 07/13/2007   Method used:   Print then Give to Patient   RxID:   [id number]  ]

## 2010-05-08 NOTE — Progress Notes (Signed)
Summary: need refills  Phone Note Call from Patient Call back at Home Phone [phone]   Caller: Patient Summary of Call: need refill on colchicine, actose, tussinex, flexeril &musinex [facility] [phone] Initial call taken by: De Nurse,  March 09, 2008 2:15 PM      Prescriptions: COLCHICINE 0.6 MG TABS (COLCHICINE) one by mouth by mouth daily  #7 x 1   Entered and Authorized by:   Myrtie Soman  MD   Signed by:   Myrtie Soman  MD on 03/10/2008   Method used:   Electronically to        [facility] (retail)       [address]       [place], [place]  [address]       Ph: [phone]       Fax: [phone]   RxID:   [id number] FLEXERIL 5 MG  TABS (CYCLOBENZAPRINE HCL) take one by mouth at bedtime as needed for back pain  #30 x 0   Entered and Authorized by:   Myrtie Soman  MD   Signed by:   Myrtie Soman  MD on 03/10/2008   Method used:   Electronically to        [facility] (retail)       [address]       [place], [place]  [address]       Ph: [phone]       Fax: [phone]   RxID:   [id number] ACTOS 30 MG TABS (PIOGLITAZONE HCL) one by mouth daily  #30 x 2   Entered and Authorized by:   Myrtie Soman  MD   Signed by:   Myrtie Soman  MD on 03/10/2008   Method used:   Electronically to        [facility] (retail)       [address]       [place], [place]  [address]       Ph: [phone]       Fax: [phone]   RxID:   [id number]

## 2010-05-08 NOTE — Assessment & Plan Note (Signed)
Summary: BP SHOT/[facility]  Nurse Visit   Vital Signs:  Patient Profile:   65 Years Old Female Height:     63 inches Pulse rate:   60 / minute BP sitting:   132 / 73  (left arm)  Vitals Entered By: Arnette Schaumann RN (February 04, 2007 9:46 AM)                 Prior Medications: ACTOS 15 MG TABS (PIOGLITAZONE HCL) Take 1 tablet by mouth once a day ALLOPURINOL 100 MG TABS (ALLOPURINOL) 1 tablet by mouth once a day BAYER CHILDRENS ASPIRIN 81 MG CHEW (ASPIRIN) Take 1 tablet by mouth once a day CLARITIN 10 MG TABS (LORATADINE) Take 1 tablet by mouth once a day ENALAPRIL-HYDROCHLOROTHIAZIDE 5-12.5 MG  TABS (ENALAPRIL-HYDROCHLOROTHIAZIDE) take 2 tabs by mouth daily GLUCOTROL XL 10 MG TB24 (GLIPIZIDE) Take 2 tablet by mouth once a day HYDROCHLOROTHIAZIDE 25 MG TABS (HYDROCHLOROTHIAZIDE) Take 1 tablet by mouth once a day TRAZAMINE 50 MG  MISC (TRAZODONE & DIET MANAGE PROD) 1 tab by mouth at bedtime prn ROBAXIN 500 MG  TABS (METHOCARBAMOL) 1 tab daily PREDNISONE 20 MG  TABS (PREDNISONE) 1 tab by mouth for 7 days HYOMAX-SL 0.125 MG  SUBL (HYOSCYAMINE SULFATE) 2 tabs by mouth once daily FLONASE 50 MCG/ACT  SUSP (FLUTICASONE PROPIONATE) 1 spray each nostril daily SERTRALINE HCL 100 MG  TABS (SERTRALINE HCL) two tabs by mouth daily ATENOLOL 100 MG  TABS (ATENOLOL) take one by mouth daily       ]

## 2010-05-08 NOTE — Progress Notes (Signed)
Summary: Home aid & rx  Phone Note Call from Patient Call back at Home Phone [phone]   Summary of Call: Wants to speak with someone about home health aid.  Also wants to discuss pain medication for her knee. Initial call taken by: Drucie Ip,  October 01, 2007 9:30 AM  Follow-up for Phone Call        Spoke with pt- she states her knee pain continues she is taking the flexeril and ibuprofen which is a bit helpful.  States it hurts to the point that it bothers her to walk some days.  Would like to know if there is anything else she can take for pain?  Scheduled her for f/u appt with Dr. Sarita Haver to discuss - advised we will contact her if Dr. Sarita Haver rxs anything for her before the appt.  Also she states her home health agency will be faxing [place] for authorization for her home health aid to stay 30 extra minutes per day to help with preparing her supper. Follow-up by: AMY MARTIN RN,  October 01, 2007 3:20 PM

## 2010-05-08 NOTE — Assessment & Plan Note (Signed)
Summary: bp check/eo  Nurse Visit   Blood Pressure Check  Did patient rest for 60 secs or longer: yes Which arm was used:   right What was the cuff size: medium Manual measurement: 144/70 Has patient taken their BP meds today: no   If no, when was last dose: yesterday  Pt BP not read by machine only done manually.................................... [place] CMA  November 27, 2007 10:30 AM     Prior Medications: ACTOS 15 MG TABS (PIOGLITAZONE HCL) Take 1 tablet by mouth once a day ALLOPURINOL 100 MG TABS (ALLOPURINOL) 1 tablet by mouth once a day BAYER CHILDRENS ASPIRIN 81 MG CHEW (ASPIRIN) Take 1 tablet by mouth once a day CLARITIN 10 MG TABS (LORATADINE) Take 1 tablet by mouth once a day GLUCOTROL XL 10 MG TB24 (GLIPIZIDE) Take 2 tablet by mouth once a day HYDROCHLOROTHIAZIDE 25 MG TABS (HYDROCHLOROTHIAZIDE) Take 1 tablet by mouth once a day TRAZAMINE 50 MG  MISC (TRAZODONE & DIET MANAGE PROD) 1 tab by mouth at bedtime prn HYOMAX-SL 0.125 MG  SUBL (HYOSCYAMINE SULFATE) 2 tabs by mouth once daily FLONASE 50 MCG/ACT  SUSP (FLUTICASONE PROPIONATE) 1 spray each nostril daily SERTRALINE HCL 100 MG  TABS (SERTRALINE HCL) two tabs by mouth daily ATENOLOL 50 MG  TABS (ATENOLOL) one by mouth daily ENALAPRIL MALEATE 20 MG  TABS (ENALAPRIL MALEATE) one tablet by mouth twice a day TUSSIONEX PENNKINETIC ER 8-10 MG/5ML  LQCR (CHLORPHENIRAMINE-HYDROCODONE) one teaspoon every 12 hours as needed for cough. Dispense 60 ml or standard amount FLEXERIL 5 MG  TABS (CYCLOBENZAPRINE HCL) take one by mouth at bedtime as needed for back pain AMLODIPINE BESYLATE 10 MG  TABS (AMLODIPINE BESYLATE) one by mouth daily MECLIZINE HCL 25 MG  TABS (MECLIZINE HCL) one by mouth 2-3 times a day as needed for vertigo     Orders Added: 1)  No Charge Patient Arrived (NCPA0) [NCPA0]    ]

## 2010-05-08 NOTE — Miscellaneous (Signed)
  Clinical Lists Changes  Medications: Changed medication from [facility] 8-10 MG/5ML  LQCR (CHLORPHENIRAMINE-HYDROCODONE) one teaspoon every 12 hours as needed for cough. Dispense 60 ml or standard amount to GUAIFENESIN-CODEINE 100-10 MG/5ML LIQD (GUAIFENESIN-CODEINE) take 1-2 teaspoons every 6 hours as needed for cough; disp 60 cc - Signed Rx of GUAIFENESIN-CODEINE 100-10 MG/5ML LIQD (GUAIFENESIN-CODEINE) take 1-2 teaspoons every 6 hours as needed for cough; disp 60 cc;  #1 x 1;  Signed;  Entered by: [name]  MD;  Authorized by: [name]  MD;  Method used: Printed then faxed to [facility], [address], [place], [place]  [address], Ph: [phone], Fax: [phone]    Prescriptions: GUAIFENESIN-CODEINE 100-10 MG/5ML LIQD (GUAIFENESIN-CODEINE) take 1-2 teaspoons every 6 hours as needed for cough; disp 60 cc  #1 x 1   Entered and Authorized by:   [name]  MD   Signed by:   [name]  MD on 02/21/2009   Method used:   Printed then faxed to ...       [facility] (retail)       [address] [place], [place]  [address]       Ph: [phone]       Fax: [phone]   RxID:   [id number]

## 2010-05-08 NOTE — Assessment & Plan Note (Signed)
Summary: f/u,df   Vital Signs:  Patient profile:   65 year old female Height:      63 inches Weight:      219.1 pounds BMI:     38.95 Temp:     97.8 degrees F oral Pulse rate:   65 / minute BP sitting:   111 / 67  (left arm) Cuff size:   large  Vitals Entered By: Garen Grams LPN (Sep 02, 2008 10:22 AM) CC: hip pain, dm in a smoker Pain Assessment Patient in pain? yes     Location: right knee   Primary Care Provider:  Myrtie Soman  MD  CC:  hip pain and dm in a smoker.  History of Present Illness: 1. bilateral hip aching States that hips have been aching for some time now. Pain described as "ache." Occasional radiation to back. Received a cortisone shot in both hips about 2 years ago which she states helped. Worse after increased activity. Would like referral to PT to improve this.  2. diabetes Taking medicines without difficulty. log of daily sugars shows most numbers between 120 and 160 with a few outliers. No hypoglycemic recordings.  ROS: no n/v/d. no chest pain, no shortness of breath. no bowel or bladder symptoms.  3. Smoking Smokes about 1/2 PPD. Would like to quit. Seems to affected when discussing increased risk of heart attack and stroke given her diagnosis of diabetes. States that I've given her something to think about and that she will consider it further. Reports chronic cough that may be attributable to this.  Physical Exam  General:  Vital signs reviewed -- obese but otherwise normal Alert, appropriate; well-dressed and well-nourished  Neck:  no JVD Lungs:  work of breathing unlabored, clear to auscultation bilaterally; no wheezes, rales, or ronchi; good air movement throughout' Heart:  regular rate and rhythm, no murmurs; normal s1/s2  Msk:  R [id number] tenderness over greater trochanter;  pain with external rotation; full ROM; + Faber  L hip: point tenderness of greater trochanter, pain with external rotaion, + faber Skin:  turgor normal, color  normal, and no rashes.     Impression & Recommendations:  Problem # 1:  HIP PAIN, BILATERAL (ICD-719.45) Assessment New  Will refer to PT. Has h/o of OA and I suspect the same here given that this has been bothering the patient for some time, her weight, and her exam. Conservative measures at this time. Her updated medication list for this problem includes:    Bayer Childrens Aspirin 81 Mg Chew (Aspirin) .[name]... Take 1 tablet by mouth once a day    Flexeril 5 Mg Tabs (Cyclobenzaprine hcl) .[name]... Take one by mouth at bedtime as needed for back pain    Ultram 50 Mg Tabs (Tramadol hcl) ..... One by mouth every 6 hours  Orders: [facility]- Est  Level 4 (54098)  Problem # 2:  DIABETES MELLITUS II, UNCOMPLICATED (ICD-250.00) Assessment: Unchanged A1C 6.9. Continue current therapy.  Her updated medication list for this problem includes:    Actos 30 Mg Tabs (Pioglitazone hcl) ..... One by mouth daily    Bayer Childrens Aspirin 81 Mg Chew (Aspirin) .[name]... Take 1 tablet by mouth once a day    Glucotrol Xl 10 Mg Tb24 (Glipizide) .[name]... Take 2 tablet by mouth once a day    Enalapril Maleate 20 Mg Tabs (Enalapril maleate) ..... One tablet by mouth twice a day  Orders: A1C-[facility] (11914) [facility]- Est  Level 4 (78295)  Problem # 3:  TOBACCO  DEPENDENCE (ICD-305.1)  appointment in pharmacy clinic for PFTs. Pt seems to be serious about thinking more about quitting. Continue to address.  Orders: [facility]- Est  Level 4 (99214)  Complete Medication List: 1)  Actos 30 Mg Tabs (Pioglitazone hcl) .... One by mouth daily 2)  Allopurinol 100 Mg Tabs (Allopurinol) .[name].. 1 tablet by mouth once a day 3)  Bayer Childrens Aspirin 81 Mg Chew (Aspirin) .... Take 1 tablet by mouth once a day 4)  Claritin 10 Mg Tabs (Loratadine) .... Take 1 tablet by mouth once a day 5)  Glucotrol Xl 10 Mg Tb24 (Glipizide) .... Take 2 tablet by mouth once a day 6)  Hydrochlorothiazide 25 Mg Tabs (Hydrochlorothiazide) .... Take 1 tablet by mouth  once a day 7)  Trazamine 50 Mg Misc (Trazodone & diet manage prod) .[name].. 1 tab by mouth at bedtime prn 8)  Dicyclomine Hcl 20 Mg Tabs (Dicyclomine hcl) .... One by mouth four times daily 9)  Flonase 50 Mcg/act Susp (Fluticasone propionate) .[name].. 1 spray each nostril daily 10)  Sertraline Hcl 100 Mg Tabs (Sertraline hcl) .... Two tabs by mouth daily 11)  Atenolol 50 Mg Tabs (Atenolol) .... One by mouth daily 12)  Enalapril Maleate 20 Mg Tabs (Enalapril maleate) .... One tablet by mouth twice a day 13)  Tussionex Pennkinetic Er 8-10 Mg/69ml Lqcr (Chlorpheniramine-hydrocodone) .... One teaspoon every 12 hours as needed for cough. dispense 60 ml or standard amount 14)  Flexeril 5 Mg Tabs (Cyclobenzaprine hcl) .... Take one by mouth at bedtime as needed for back pain 15)  Amlodipine Besylate 10 Mg Tabs (Amlodipine besylate) .... One by mouth daily 16)  Meclizine Hcl 25 Mg Tabs (Meclizine hcl) .... One by mouth 2-3 times a day as needed for vertigo 17)  Colchicine 0.6 Mg Tabs (Colchicine) .... One by mouth by mouth daily 18)  Ultram 50 Mg Tabs (Tramadol hcl) .... One by mouth every 6 hours  Other Orders: Physical Therapy Referral (PT) Future Orders: Direct LDL-[facility] ([id number]) ... 08/30/2009 Comp Met-[facility] ([id number]) ... 08/29/2009  Patient Instructions: 1)  Set up an appointment with our pharmacy clinic to do pulmonary function testing given your smoking history. 2)  You need to stop smoking. 3)  Continue your diabetic medications. 4)  We'll arrange physical therapy for your hip. 5)  follow-up with me in 3-4 months.  Laboratory Results   Blood Tests   Date/Time Received: Sep 02, 2008 10:28 AM  Date/Time Reported: Sep 02, 2008 11:17 AM   HGBA1C: 6.9%   (Normal Range: Non-Diabetic - 3-6%   Control Diabetic - 6-8%)  Comments: ...............test performed by......Marland KitchenBonnie A. [place], [place] (ASCP)

## 2010-05-08 NOTE — Assessment & Plan Note (Signed)
Summary: f/up,tcb   Vital Signs:  Patient profile:   65 year old female Height:      62 inches Weight:      219 pounds BMI:     40.20 BSA:     1.99 Temp:     98.2 degrees F Pulse rate:   116 / minute BP sitting:   127 / 76  Vitals Entered By: Jone Baseman CMA (June 12, 2009 11:10 AM) CC: f/u Is Patient Diabetic? No Pain Assessment Patient in pain? yes     Location: joints Intensity: 4   Primary Care Provider:  Myrtie Soman  MD  CC:  f/u.  History of Present Illness: 1. Diabetes last A1c: 6.4 1/11 problems with medications?:none blood sugar testing frequency: 1 x per day -- numbers 100s to 150s hypoglycemic events?: no subjective: sugars are fairly well-controlled. Thinks her diabetes is doing well.  2. Hypertension last BP: 120/62 side effects from medications? no outside testing: mostly 110s to 120s systolic subjective: doing well. No problems with this issue  3. Arthritis Feels like her arthritis pain is worsening. States that her knees are most effective. Would like some pain medication for this issue. Takes tramadol at night for occasional back pain. Has taken ibuprofen and aleve for the arthritis pain without much improvement.  4. OSA MIld OSA per testing by Dr. Shelle Iron. Wearing CPAP at night about 4 hours but it often is off by the next morning. Still feels better during the day after starting this treatment. Is trying to work on wearing the mask longer at night  5. tobacco On chantix. Weaning down her smoking. Thinks she'll be able to stop.   Habits & Providers  Alcohol-Tobacco-Diet     Tobacco Status: current     Tobacco Counseling: to quit use of tobacco products     Cigarette Packs/Day: 0.25  Current Medications (verified): 1)  Actos 30 Mg Tabs (Pioglitazone Hcl) .... One By Mouth Daily 2)  Allopurinol 300 Mg Tabs (Allopurinol) .... One By Mouth Daily To Prevent Gout 3)  Bayer Childrens Aspirin 81 Mg Chew (Aspirin) .... Take 1 Tablet By  Mouth Once A Day 4)  Claritin 10 Mg Tabs (Loratadine) .... Take 1 Tablet By Mouth Once A Day 5)  Glucotrol Xl 10 Mg Tb24 (Glipizide) .... Take 2 Tablet By Mouth Once A Day 6)  Hydrochlorothiazide 25 Mg Tabs (Hydrochlorothiazide) .... Take 1 Tablet By Mouth Once A Day 7)  Dicyclomine Hcl 20 Mg Tabs (Dicyclomine Hcl) .... One By Mouth Four Times Daily 8)  Flonase 50 Mcg/act  Susp (Fluticasone Propionate) .[name].. 1 Spray Each Nostril Daily 9)  Sertraline Hcl 100 Mg  Tabs (Sertraline Hcl) .... Two Tabs By Mouth Daily 10)  Atenolol 50 Mg  Tabs (Atenolol) .... One By Mouth Daily 11)  Enalapril Maleate 20 Mg  Tabs (Enalapril Maleate) .... One Tablet By Mouth Twice A Day 12)  Guaifenesin-Codeine 100-10 Mg/36ml Liqd (Guaifenesin-Codeine) .... Take 1-2 Teaspoons Every 6 Hours As Needed For Cough; Disp 60 Cc 13)  Flexeril 5 Mg  Tabs (Cyclobenzaprine Hcl) .... Take One By Mouth At Bedtime As Needed For Back Pain 14)  Amlodipine Besylate 10 Mg  Tabs (Amlodipine Besylate) .... One By Mouth Daily 15)  Nitrostat 0.4 Mg Subl (Nitroglycerin) .... One By Mouth Sublingually As Needed For Chest Pain 16)  Lipitor 10 Mg Tabs (Atorvastatin Calcium) .... One By Mouth Daily For Cholesterol 17)  Chantix Continuing Month Pak 1 Mg Tabs (Varenicline Tartrate) .... Take As  Directed  Allergies (verified): No Known Drug Allergies  Social History: Packs/Day:  0.25  Review of Systems       ROS: denies CP, SOB, fever, chills, bowel or bladder problems, polydipsia/polyuria   Physical Exam  Additional Exam:  General:  Vital signs reviewed -- obese but otherwise normal Alert, appropriate; well-dressed and well-nourished Lungs:  work of breathing unlabored, clear to auscultation bilaterally; no wheezes, rales, or ronchi; good air movement throughout Heart:  regular rate and rhythm, no murmurs; normal s1/s2 Pulses:  DP and radial pulses 2+ bilaterally  Extremities:  no cyanosis, clubbing, or edema Neurologic:  alert and  oriented. speech normal.    Impression & Recommendations:  Problem # 1:  DIABETES MELLITUS II, UNCOMPLICATED (ICD-250.00) Assessment Unchanged  continue current regimen. stable. Her updated medication list for this problem includes:    Actos 30 Mg Tabs (Pioglitazone hcl) ..... One by mouth daily    Bayer Childrens Aspirin 81 Mg Chew (Aspirin) .[name]... Take 1 tablet by mouth once a day    Glucotrol Xl 10 Mg Tb24 (Glipizide) .[name]... Take 2 tablet by mouth once a day    Enalapril Maleate 20 Mg Tabs (Enalapril maleate) ..... One tablet by mouth twice a day  Orders: [facility]- Est  Level 4 (16109)  Problem # 2:  HYPERTENSION (ICD-401.9) Assessment: Unchanged stable. continue current regimen Her updated medication list for this problem includes:    Hydrochlorothiazide 25 Mg Tabs (Hydrochlorothiazide) .[name]... Take 1 tablet by mouth once a day    Atenolol 50 Mg Tabs (Atenolol) ..... One by mouth daily    Enalapril Maleate 20 Mg Tabs (Enalapril maleate) ..... One tablet by mouth twice a day    Amlodipine Besylate 10 Mg Tabs (Amlodipine besylate) ..... 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 Pt agreeable. advised that she should avoid NSAIDs  given diabetes and HTN. Her updated medication list for this problem includes:    Bayer Childrens Aspirin 81 Mg Chew (Aspirin) .[name]... Take 1 tablet by mouth once a day  Orders: [facility]- Est  Level 4 (60454)  Problem # 4:  OBSTRUCTIVE SLEEP APNEA (ICD-327.23) Assessment: Unchanged  continue CPAP  Orders: [facility]- Est  Level 4 (09811)  Problem # 5:  TOBACCO DEPENDENCE (ICD-305.1) Assessment: Unchanged  continue chantix. follow. pt actively trying to quit. Her updated medication list for this problem includes:    Chantix Continuing Month Pak 1 Mg Tabs (Varenicline tartrate) .[name]... Take as directed  Orders: [facility]-  Est  Level 4 (99214)  Complete Medication List: 1)  Actos 30 Mg Tabs (Pioglitazone hcl) .... One by mouth daily 2)  Allopurinol 300 Mg Tabs (Allopurinol) .... One by mouth daily to prevent gout 3)  Bayer Childrens Aspirin 81 Mg Chew (Aspirin) .... Take 1 tablet by mouth once a day 4)  Claritin 10 Mg Tabs (Loratadine) .... Take 1 tablet by mouth once a day 5)  Glucotrol Xl 10 Mg Tb24 (Glipizide) .... Take 2 tablet by mouth once a day 6)  Hydrochlorothiazide 25 Mg Tabs (Hydrochlorothiazide) .... Take 1 tablet by mouth once a day 7)  Dicyclomine Hcl 20 Mg Tabs (Dicyclomine hcl) .... One by mouth four times daily 8)  Flonase 50 Mcg/act Susp (Fluticasone propionate) .[name].. 1 spray each nostril daily 9)  Sertraline Hcl 100 Mg Tabs (Sertraline hcl) .... Two tabs by mouth daily 10)  Atenolol 50 Mg  Tabs (Atenolol) .... One by mouth daily 11)  Enalapril Maleate 20 Mg Tabs (Enalapril maleate) .... One tablet by mouth twice a day 12)  Guaifenesin-codeine 100-10 Mg/43ml Liqd (Guaifenesin-codeine) .... Take 1-2 teaspoons every 6 hours as needed for cough; disp 60 cc 13)  Flexeril 5 Mg Tabs (Cyclobenzaprine hcl) .... Take one by mouth at bedtime as needed for back pain 14)  Amlodipine Besylate 10 Mg Tabs (Amlodipine besylate) .... One by mouth daily 15)  Nitrostat 0.4 Mg Subl (Nitroglycerin) .... One by mouth sublingually as needed for chest pain 16)  Lipitor 10 Mg Tabs (Atorvastatin calcium) .... One by mouth daily for cholesterol 17)  Chantix Continuing Month Pak 1 Mg Tabs (Varenicline tartrate) .... Take as directed  Patient Instructions: 1)  follow-up with me in June 2)  have the front desk schedule a nutrition appointment in the next 6 weeks or so to talk about healthy eating and weight. 3)  continue your other medicines as they are 4)  take the tramadol for your arthritis -- I would avoid ibuprofen and aleve because of your high blood pressure and diabetes   Prevention & Chronic Care Immunizations    Influenza vaccine: Fluvax 3+  (01/31/2009)   Influenza vaccine due: 12/27/2008    Tetanus booster: 06/06/2004: Done.   Tetanus booster due: 06/07/2014    Pneumococcal vaccine: Done.  (03/08/2004)   Pneumococcal vaccine due: None    H. zoster vaccine: 03/08/2008: .  Colorectal Screening   Hemoccult: Done.  (09/07/2003)   Hemoccult due: Not Indicated    Colonoscopy: normal  (04/19/2004)   Colonoscopy due: 04/19/2014  Other Screening   Pap smear: normal  (06/23/2007)   Pap smear due: 06/23/2010    Mammogram: ASSESSMENT: Negative - BI-RADS 1^MM DIGITAL SCREENING  (12/30/2008)   Mammogram due: 12/21/2008    DXA bone density scan: Not documented   Smoking status: current  (06/12/2009)   Smoking cessation counseling: yes  (12/28/2007)   Target quit date: 05/23/2009  (05/02/2009)  Diabetes Mellitus   HgbA1C: 6.4  (04/26/2009)   Hemoglobin A1C due: 01/20/2008    Eye exam: normal  (11/14/2008)   Eye exam due: 11/15/2008    Foot exam: yes  (01/04/2009)   Foot exam action/deferral: Do today   High risk foot: Not documented   Foot care education: Not documented   Foot exam due: 12/27/2008    Urine microalbumin/creatinine ratio: Not documented   Urine microalbumin/cr due: 11/26/2008    Diabetes flowsheet reviewed?: Yes   Progress toward A1C goal: At goal  Lipids   Total Cholesterol: 160  (03/13/2009)   LDL: See Comment mg/dL  (54/27/0623)   LDL Direct: 82.4  (03/13/2009)   HDL: 26.90  (03/13/2009)   Triglycerides: 359.0  (03/13/2009)    SGOT (AST): 15  (04/26/2009)   SGPT (ALT): 16  (04/26/2009)   Alkaline phosphatase: 101  (04/26/2009)   Total bilirubin: 0.3  (04/26/2009)    Lipid flowsheet reviewed?: Yes   Progress toward LDL goal: Unchanged  Hypertension   Last Blood Pressure: 127 / 76  (06/12/2009)   Serum creatinine: 0.78  (04/26/2009)   Serum potassium 4.4  (04/26/2009)    Hypertension flowsheet reviewed?: Yes   Progress toward BP goal: At  goal  Self-Management Support :   Personal Goals (by the next clinic visit) :     Personal A1C goal: 7  (01/04/2009)     Personal blood pressure goal: 130/80  (01/04/2009)     Personal LDL goal: 70  (  01/04/2009)    Diabetes self-management support: Written self-care plan  (06/12/2009)   Diabetes care plan printed    Diabetes self-management support not done because: Good outcomes  (04/26/2009)    Hypertension self-management support: Written self-care plan  (06/12/2009)   Hypertension self-care plan printed.    Hypertension self-management support not done because: Good outcomes  (06/12/2009)    Lipid self-management support: Written self-care plan  (06/12/2009)   Lipid self-care plan printed.    Lipid self-management support not done because: Good outcomes  (06/12/2009)

## 2010-05-08 NOTE — Assessment & Plan Note (Signed)
Summary: f/up,tcb   Vital Signs:  Patient profile:   65 year old female Height:      63 inches Weight:      221 pounds BMI:     39.29 BSA:     2.02 Temp:     98.2 degrees F Pulse rate:   66 / minute BP sitting:   130 / 74  Vitals Entered By: Jone Baseman CMA (April 26, 2009 10:39 AM) CC: routine visit Is Patient Diabetic? Yes Did you bring your meter with you today? Yes Pain Assessment Patient in pain? no        Primary Care Provider:  Myrtie Soman  MD  CC:  routine visit.  History of Present Illness: 1. smoking Attended smoking class in December. Hasn't gotten the patches yet because she didn't have the money. Will plan on doing this next month. Continues to smoke 1/2 PPD. Wants to quit. Has chosen NRT as her method.   2. diabetes A1c today 6.4. On actos and glipizide. No problems with these medicines. Sugar log shows numbers mostly in the 110s to 140s. No hypoglycemic episodes.   3. weight Has gained 6 lbs since December. Admits that she ate more than usual over the holidays. Would like to lose weight. Open to coming to a nutrition appointment. Has some exercise equipment at home. Open to looking into the silver sneakers program at the [facility].   4. lipids. Had FLP done by Dr. Tenny Craw at [facility]. Showed LDL of 84, trig  ~ 350 and HDL of 27. Hasn't heard anything about the results from them. Not currently on a statin. Reluctant but willing to start one if it would be good for her. Had adenosine myoview in December per Dr. Tenny Craw that was normal. EKG by Dr. Tenny Craw also normal at initial visit in November.   ROS: no new chest pain, SOB at baseline. Has chronic cough though this is some better. Normal bowel and bladder function.   Habits & Providers  Alcohol-Tobacco-Diet     Tobacco Status: current     Tobacco Counseling: to quit use of tobacco products     Cigarette Packs/Day: 0.5  Current Medications (verified): 1)  Actos 30 Mg Tabs (Pioglitazone Hcl) .... One By  Mouth Daily 2)  Allopurinol 200 Mg Tab .... Take 1 Tab By Mouth Daily For Gout 3)  Bayer Childrens Aspirin 81 Mg Chew (Aspirin) .... Take 1 Tablet By Mouth Once A Day 4)  Claritin 10 Mg Tabs (Loratadine) .... Take 1 Tablet By Mouth Once A Day 5)  Glucotrol Xl 10 Mg Tb24 (Glipizide) .... Take 2 Tablet By Mouth Once A Day 6)  Hydrochlorothiazide 25 Mg Tabs (Hydrochlorothiazide) .... Take 1 Tablet By Mouth Once A Day 7)  Trazamine 50 Mg  Misc (Trazodone & Diet Manage Prod) .[name].. 1 Tab By Mouth At Bedtime Prn 8)  Dicyclomine Hcl 20 Mg Tabs (Dicyclomine Hcl) .... One By Mouth Four Times Daily 9)  Flonase 50 Mcg/act  Susp (Fluticasone Propionate) .[name].. 1 Spray Each Nostril Daily 10)  Sertraline Hcl 100 Mg  Tabs (Sertraline Hcl) .... Two Tabs By Mouth Daily 11)  Atenolol 50 Mg  Tabs (Atenolol) .... One By Mouth Daily 12)  Enalapril Maleate 20 Mg  Tabs (Enalapril Maleate) .... One Tablet By Mouth Twice A Day 13)  Guaifenesin-Codeine 100-10 Mg/64ml Liqd (Guaifenesin-Codeine) .... Take 1-2 Teaspoons Every 6 Hours As Needed For Cough; Disp 60 Cc 14)  Flexeril 5 Mg  Tabs (Cyclobenzaprine Hcl) .... Take One  By Mouth At Bedtime As Needed For Back Pain 15)  Amlodipine Besylate 10 Mg  Tabs (Amlodipine Besylate) .... One By Mouth Daily 16)  Ultram 50 Mg Tabs (Tramadol Hcl) .... One By Mouth Every 6 Hours 17)  Nitrostat 0.4 Mg Subl (Nitroglycerin) .... One By Mouth Sublingually As Needed For Chest Pain  Allergies (verified): No Known Drug Allergies  Social History: Divorced '97.  Lives alone, smokes.  No etoh.  Applying for disabilty through [facility] (ortho). Has trouble affording medications. Recently moved to 1st floor apartment housing (end of June 2010). Likes it there.   Grandson recently sent to jail - (38 - 42 months) -- this is stressful for the patient.  Physical Exam  Additional Exam:  General:  Vital signs reviewed -- obese but otherwise normal Alert, appropriate; well-dressed and  well-nourished Neck: no carotid bruits, no JVD, no tenderness or masses Lungs:  work of breathing unlabored, clear to auscultation bilaterally; no wheezes, rales, or ronchi; good air movement throughout Heart:  regular rate and rhythm, no murmurs; normal s1/s2 Pulses:  DP and radial pulses 2+ bilaterally  Extremities:  no cyanosis, clubbing, or edema Neurologic:  alert and oriented. speech normal.    Impression & Recommendations:  Problem # 1:  TOBACCO DEPENDENCE (ICD-305.1) Assessment Unchanged  encouraged pt re: plan to quit. To keep me updated. Discussed the health risks of continuing to smoke.   Orders: [facility]- Est  Level 4 (04540)  Problem # 2:  DIABETES MELLITUS II, UNCOMPLICATED (ICD-250.00) Assessment: Improved sugar log and A1c indicate good control. Continue current regimen.  Her updated medication list for this problem includes:    Actos 30 Mg Tabs (Pioglitazone hcl) ..... One by mouth daily    Bayer Childrens Aspirin 81 Mg Chew (Aspirin) .[name]... Take 1 tablet by mouth once a day    Glucotrol Xl 10 Mg Tb24 (Glipizide) .[name]... Take 2 tablet by mouth once a day    Enalapril Maleate 20 Mg Tabs (Enalapril maleate) ..... One tablet by mouth twice a day  Orders: A1C-[facility] (98119) [facility]- Est  Level 4 ([address]782)  Problem # 3:  OBESITY, UNSPECIFIED (ICD-278.00) Assessment: Unchanged  discussed weight loss strategies. Pt to call to ask about silver sneakers program. Goal of 20-30 minutes of exercise 3-5 days per week. To watch intake of fried foods, breads. Increase fruits and vegetables. Continue to watch sugars.   Orders: [facility]- Est  Level 4 (95621)  Problem # 4:  DYSLIPIDEMIA (ICD-272.4) Assessment: Unchanged lipid panel per Dr. Tenny Craw shows lipids above goal, low HDL and high trigs. Will target LDL first with goal of 70 or less. Add lipitor. Will likely need HDL medicine as well. Check CMET today.  Her updated medication list for this problem includes:    Lipitor 10 Mg Tabs  (Atorvastatin calcium) ..... One by mouth daily for cholesterol  Orders: Comp Met-[facility] (30865-78469) [facility]- Est  Level 4 (62952)  Complete Medication List: 1)  Actos 30 Mg Tabs (Pioglitazone hcl) .... One by mouth daily 2)  Allopurinol 200 Mg Tab  .... Take 1 tab by mouth daily for gout 3)  Bayer Childrens Aspirin 81 Mg Chew (Aspirin) .... Take 1 tablet by mouth once a day 4)  Claritin 10 Mg Tabs (Loratadine) .... Take 1 tablet by mouth once a day 5)  Glucotrol Xl 10 Mg Tb24 (Glipizide) .... Take 2 tablet by mouth once a day 6)  Hydrochlorothiazide 25 Mg Tabs (Hydrochlorothiazide) .... Take 1 tablet by mouth once a day 7)  Trazamine 50 Mg Misc (Trazodone & diet manage prod) .[name].. 1 tab by mouth at bedtime prn 8)  Dicyclomine Hcl 20 Mg Tabs (Dicyclomine hcl) .... One by mouth four times daily 9)  Flonase 50 Mcg/act Susp (Fluticasone propionate) .[name].. 1 spray each nostril daily 10)  Sertraline Hcl 100 Mg Tabs (Sertraline hcl) .... Two tabs by mouth daily 11)  Atenolol 50 Mg Tabs (Atenolol) .... One by mouth daily 12)  Enalapril Maleate 20 Mg Tabs (Enalapril maleate) .... One tablet by mouth twice a day 13)  Guaifenesin-codeine 100-10 Mg/19ml Liqd (Guaifenesin-codeine) .... Take 1-2 teaspoons every 6 hours as needed for cough; disp 60 cc 14)  Flexeril 5 Mg Tabs (Cyclobenzaprine hcl) .... Take one by mouth at bedtime as needed for back pain 15)  Amlodipine Besylate 10 Mg Tabs (Amlodipine besylate) .... One by mouth daily 16)  Ultram 50 Mg Tabs (Tramadol hcl) .... One by mouth every 6 hours 17)  Nitrostat 0.4 Mg Subl (Nitroglycerin) .... One by mouth sublingually as needed for chest pain 18)  Lipitor 10 Mg Tabs (Atorvastatin calcium) .... One by mouth daily for cholesterol  Other Orders: Uric Acid-[facility] ([id number])  Patient Instructions: 1)  set up an appointment in our nutrition clinic in the next 2-4 weeks to talk about your diet and weight. 2)  call [facility] to ask about the Silver Sneakers  program. 3)  let me know how the stopping smoking's going. 4)  start the lipitor for your cholesterol 5)  follow-up in 3-4 months. Prescriptions: LIPITOR 10 MG TABS (ATORVASTATIN CALCIUM) one by mouth daily for cholesterol  #30 x 2   Entered and Authorized by:   Myrtie Soman  MD   Signed by:   Myrtie Soman  MD on 04/26/2009   Method used:   Faxed to ...       [facility] (mail-order)       [address]       [place], [place]  [address]       Ph: [phone]       Fax: [phone]   RxID:   [id number]    Prevention & Chronic Care Immunizations   Influenza vaccine: Fluvax 3+  (01/31/2009)   Influenza vaccine due: 12/27/2008    Tetanus booster: 06/06/2004: Done.   Tetanus booster due: 06/07/2014    Pneumococcal vaccine: Done.  (03/08/2004)   Pneumococcal vaccine due: None    H. zoster vaccine: 03/08/2008: .  Colorectal Screening   Hemoccult: Done.  (09/07/2003)   Hemoccult due: Not Indicated    Colonoscopy: normal  (04/19/2004)   Colonoscopy due: 04/19/2014  Other Screening   Pap smear: normal  (06/23/2007)   Pap smear due: 06/23/2010    Mammogram: ASSESSMENT: Negative - BI-RADS 1^MM DIGITAL SCREENING  (12/30/2008)   Mammogram due: 12/21/2008    DXA bone density scan: Not documented   Smoking status: current  (04/26/2009)   Smoking cessation counseling: yes  (12/28/2007)  Diabetes Mellitus   HgbA1C: 6.4  (04/26/2009)   Hemoglobin A1C due: 01/20/2008    Eye exam: normal  (11/14/2008)   Eye exam due: 11/15/2008    Foot exam: yes  (01/04/2009)   Foot exam action/deferral: Do today   High risk foot: Not documented   Foot care education: Not documented   Foot exam due: 12/27/2008    Urine microalbumin/creatinine ratio: Not documented   Urine microalbumin/cr due: 11/26/2008  Lipids   Total Cholesterol: 160  (03/13/2009)   LDL: See Comment mg/dL  (  10/20/2007)   LDL Direct: 82.4  (03/13/2009)   HDL: 26.90  (03/13/2009)    Triglycerides: 359.0  (03/13/2009)    SGOT (AST): 14  (11/23/2008)   SGPT (ALT): 14  (11/23/2008) CMP ordered    Alkaline phosphatase: 103  (11/23/2008)   Total bilirubin: 0.4  (11/23/2008)  Hypertension   Last Blood Pressure: 130 / 74  (04/26/2009)   Serum creatinine: 0.84  (11/23/2008)   Serum potassium 4.4  (11/23/2008) CMP ordered   Self-Management Support :   Personal Goals (by the next clinic visit) :     Personal A1C goal: 7  (01/04/2009)     Personal blood pressure goal: 130/80  (01/04/2009)     Personal LDL goal: 70  (01/04/2009)    Patient will work on the following items until the next clinic visit to reach self-care goals:     Medications and monitoring: take my medicines every day, check my blood sugar, check my blood pressure, bring all of my medications to every visit  (04/26/2009)     Eating: drink diet soda or water instead of juice or soda, eat more vegetables, use fresh or frozen vegetables, eat baked foods instead of fried foods  (04/26/2009)    Diabetes self-management support: Written self-care plan  (04/26/2009)   Diabetes care plan printed    Diabetes self-management support not done because: Good outcomes  (04/26/2009)    Hypertension self-management support: Written self-care plan  (04/26/2009)   Hypertension self-care plan printed.    Hypertension self-management support not done because: Good outcomes  (04/26/2009)    Lipid self-management support: Written self-care plan, Education handout, Pre-printed educational material  (04/26/2009)   Lipid self-care plan printed.   Lipid education handout printed  Laboratory Results   Blood Tests   Date/Time Received: April 26, 2009 10:41 AM  Date/Time Reported: April 26, 2009 11:18 AM   HGBA1C: 6.4%   (Normal Range: Non-Diabetic - 3-6%   Control Diabetic - 6-8%)  Comments: ...........test performed by...........Marland KitchenTerese Door, CMA

## 2010-05-08 NOTE — Assessment & Plan Note (Signed)
Summary: Cardiology Nuclear Study  Nuclear Med Background Indications for Stress Test: Evaluation for Ischemia, Post Hospital  Indications Comments: Admission 01/12/09: Chest pain, (-) enzymes.  History: COPD, Echo  History Comments: 10/10 Echo: EF=60-65%,LVH, mild AS.  Symptoms: Chest Pain, Dizziness, DOE, Fatigue, Light-Headedness, Rapid HR  Symptoms Comments: Last episode of FB:PPHK since discharge.   Nuclear Pre-Procedure Cardiac Risk Factors: Family History - CAD, Hypertension, Lipids, NIDDM, Obesity, Smoker Caffeine/Decaff Intake: None NPO After: 9:00 PM Lungs: Deminished, but no wheezes. IV 0.9% NS with Angio Cath: 22g     IV Site: (R) AC IV Started by: Irven Baltimore RN Chest Size (in) 44     Cup Size D     Height (in): 63 Weight (lb): 215 BMI: 38.22  Nuclear Med Study 1 or 2 day study:  1 day     Stress Test Type:  Carlton Adam Reading MD:  Dola Argyle, MD     Referring MD:  Dorris Carnes, MD Resting Radionuclide:  Technetium 73mTetrofosmin     Resting Radionuclide Dose:  10.5 mCi  Stress Radionuclide:  Technetium 91metrofosmin     Stress Radionuclide Dose:  33.0 mCi   Stress Protocol   Lexiscan: 0.4 mg   Stress Test Technologist:  ShValetta FullerMA-N     Nuclear Technologist:  StCharlton AmorNMT  Rest Procedure  Myocardial perfusion imaging was performed at rest 45 minutes following the intravenous administration of Myoview Technetium 9973mtrofosmin.  Stress Procedure  The patient received IV Lexiscan 0.4 mg over 15-seconds.  Myoview injected at 30-seconds.  There was a very brief episode of 2nd degree AVB with infusion.  She did c/o chest tightness with infusion.  Quantitative spect images were obtained after a 45 minute delay.  QPS Raw Data Images:  Normal; no motion artifact; normal heart/lung ratio. Stress Images:  Mild anterior breast attenuation Rest Images:  Normal homogeneous uptake in all areas of the myocardium. Subtraction (SDS):  No evidence of  ischemia. Transient Ischemic Dilatation:  1.14  (Normal <1.22)  Lung/Heart Ratio:  .34  (Normal <0.45)  Quantitative Gated Spect Images QGS EDV:  115 ml QGS ESV:  50 ml QGS EF:  56 % QGS cine images:  Normal  Findings Normal nuclear study      Overall Impression  Exercise Capacity: Lexiscan BP Response: Normal blood pressure response. Clinical Symptoms: Chest tight ECG Impression: No significant ST segment change suggestive of ischemia. Overall Impression: Normal stress nuclear study. There is mild shifting anterior breast attenuation. There is no ischemia.  Appended Document: Cardiology Nuclear Study Stress test is normal.  Does not appear that chest pains are her heart.   Needs to stop smoking Work on glucose control F/U appt in April.  Appended Document: Cardiology Nuclear Study LM for call back for results.  Appended Document: Cardiology Nuclear Study Patient aware of stress test results and recommendations of Dr.Ross.

## 2010-05-08 NOTE — Progress Notes (Signed)
Summary: Rx Req  Phone Note Refill Request Call back at Home Phone [phone] Message from:  Patient  Refills Requested: Medication #1:  Cathie Hoops ER 8-10 MG/5ML  LQCR one teaspoon every 12 hours as needed for cough. Dispense 60 ml or standard amount AND ALSO NEEDS HER GOUT MEDICINE CALLED IN.  US[facility] [phone].    Initial call taken by: Raymond Gurney,  January 05, 2009 9:07 AM  Follow-up for Phone Call        states "he was supposed to call it in" when I asked about appt for cough. message to pcp re: these 2 refills. told pt I will call her when I hear back from md Follow-up by: Elige Radon RN,  January 05, 2009 9:08 AM  Additional Follow-up for Phone Call Additional follow up Details #1::        called both rx's in. please call pt to advise. thanks. Additional Follow-up by: [name]  MD,  January 05, 2009 10:16 AM    Additional Follow-up for Phone Call Additional follow up Details #2::    pt notified Follow-up by: Elige Radon RN,  January 05, 2009 10:19 AM  New/Updated Medications: * ALLOPURINOL 200 MG TAB take 1 tab by mouth daily for gout Prescriptions: TUSSIONEX PENNKINETIC ER 8-10 MG/5ML  LQCR (CHLORPHENIRAMINE-HYDROCODONE) one teaspoon every 12 hours as needed for cough. Dispense 60 ml or standard amount  #1 x 0   Entered and Authorized by:   [name]  MD   Signed by:   [name]  MD on 01/05/2009   Method used:   Telephoned to ...       [facility] (retail)       [address] [place], [place]  [address]       Ph: [phone]       Fax: [phone]   RxID:   [id number] ALLOPURINOL 200 MG TAB take 1 tab by mouth daily for gout  #60 x 1   Entered and Authorized by:   [name]  MD   Signed by:   [name]  MD on 01/05/2009   Method used:   Telephoned to ...       [facility] (retail)       [address] [place], [place]  [address]       Ph: [phone]  Fax: [phone]   [place]:   [phone]

## 2010-05-08 NOTE — Progress Notes (Signed)
Summary: Rx question  Phone Note Call from Patient Call back at Home Phone [phone]   Summary of Call: Pt wants to speak with someone regarding her hydrochlorthiazide. Initial call taken by: Drucie Ip,  March 09, 2007 4:30 PM  Follow-up for Phone Call        Pt needs a refill on her HCTZ and also would like to remind MD about the new BP meds that was discussed, wants to know if those are going to be sent. Follow-up by: Christen Bame CMA,  March 09, 2007 4:45 PM  Additional Follow-up for Phone Call Additional follow up Details #1::        Called in to [facility]. Pt has started taking atenolol.      Prescriptions: HYDROCHLOROTHIAZIDE 25 MG TABS (HYDROCHLOROTHIAZIDE) Take 1 tablet by mouth once a day  #30 x 2   Entered and Authorized by:   [name]  MD   Signed by:   [name]  MD on 03/10/2007   Method used:   Telephoned to ...       [facility])       [place]  [address]  [place]       Ph: [phone]       Fax: [phone]   RxID:   [id number]

## 2010-05-08 NOTE — Miscellaneous (Signed)
Summary: REQUEST FOR DURABLE EQUIP  Clinical Lists Changes This was dropped off by the vendor to have orders given for diabetic shoes,heat therapy pump & electirc seat lifter for ths pt.  Check box ...................................................................BARBARA MCGREGOR  March 11, 2007 6:54 AM.  Clinical info completed and placed in MD's box ..................Marland KitchenDelores Pate-Gaddy, CMA (AAMA) March 11, 2007 9:00 AM     Appended Document: REQUEST FOR DURABLE EQUIP Form completed. Placed in "to be faxed" box.

## 2010-05-08 NOTE — Initial Assessments (Signed)
Summary: CP   Vital Signs:  Patient profile:   65 year old female Height:      63 inches Weight:      215 pounds O2 Sat:      98 % Temp:     97.6 degrees F Pulse rate:   59 / minute BP sitting:   147 / 67  Vitals Entered By: April Manson CMA (January 12, 2009 11:25 AM)  Primary Care Naeema Patlan:  [name]  MD  CC:  Hospital admission for Chest pain.  History of Present Illness: 65 y/o F with HTN, DM, Tobacco abuse presented to [facility] today for flu vaccine.  While here pt told the nurse that she had chest pain that started yesterday afternoon.  Pt was cleaning her closet when chest pain developed.  Pain started at 4pm and Pt thinks chest pain stopped at 3 AM.  Pain is persistent.   It is described as "pounding", located substernally and left chest.  Pain does not radiated to left arm or neck or jaw.  Denies nausea/vomiting.  She does endorse diaphoresis and  shortness of breath at time of chest pain.  Pain is worse when she moves around.  Pt did not take any medicine for the pain.  She went shopping yesterday and felt tired and "heaviness" in her chest upon returning home.  This morning she took all her medications, including ASA 5m and antihypertensive and DM meds.  Currently she denies chest pain.  Pt has not had chest pain in past.  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 One By Mouth Daily 2)  Allopurinol 200 Mg Tab .... Take 1 Tab By Mouth Daily For Gout 3)  Bayer Childrens Aspirin 81 Mg Chew (Aspirin) .... Take 1 Tablet By Mouth Once A Day 4)  Claritin 10 Mg Tabs (Loratadine) .... Take 1 Tablet By Mouth Once A Day 5)  Glucotrol Xl 10 Mg Tb24 (Glipizide) .... Take 2 Tablet By Mouth Once A Day 6)  Hydrochlorothiazide 25 Mg Tabs (Hydrochlorothiazide) .... Take 1 Tablet By Mouth Once A Day 7)  Trazamine 50 Mg  Misc (Trazodone & Diet Manage Prod) ..[name]. 1 Tab By Mouth At Bedtime Prn 8)  Dicyclomine Hcl 20 Mg Tabs (Dicyclomine Hcl) .... One By Mouth Four Times Daily 9)  Flonase 50 Mcg/act  Susp (Fluticasone Propionate) ..[name]. 1 Spray Each Nostril Daily 10)  Sertraline Hcl 100 Mg  Tabs (Sertraline Hcl) .... Two Tabs By Mouth Daily 11)  Atenolol 50 Mg  Tabs (Atenolol) .... One By Mouth Daily 12)  Enalapril Maleate 20 Mg  Tabs (Enalapril Maleate) .... One Tablet By Mouth Twice A Day 13)  Tussionex Pennkinetic Er 8-10 Mg/560m Lqcr (Chlorpheniramine-Hydrocodone) .... One Teaspoon Every 12 Hours  As Needed For Cough. Dispense 60 Ml or Standard Amount 14)  Flexeril 5 Mg  Tabs (Cyclobenzaprine Hcl) .... Take One By Mouth At Bedtime As Needed For Back Pain 15)  Amlodipine Besylate 10 Mg  Tabs (Amlodipine Besylate) .... One By Mouth Daily 16)  Ultram 50 Mg Tabs (Tramadol Hcl) .... One By Mouth Every 6 Hours 17)  Ventolin Hfa 108 (90 Base) Mcg/act Aers (Albuterol Sulfate) .... 2 Puffs Every Four Hours As Needed  Current Medications (verified): 1)  Actos 30 Mg Tabs (Pioglitazone Hcl) .... One By Mouth Daily 2)  Allopurinol 200 Mg Tab .... Take 1 Tab By Mouth Daily For Gout 3)  Bayer Childrens Aspirin 81 Mg Chew (Aspirin) .... Take 1 Tablet By Mouth Once A Day 4)  Claritin 10 Mg Tabs (Loratadine) ....  Take 1 Tablet By Mouth Once A Day 5)  Glucotrol Xl 10 Mg Tb24 (Glipizide) .... Take 2 Tablet By Mouth Once A Day 6)  Hydrochlorothiazide 25 Mg Tabs (Hydrochlorothiazide) .... Take 1 Tablet By Mouth Once A Day 7)  Trazamine 50 Mg  Misc (Trazodone & Diet Manage Prod) .[name].. 1 Tab By Mouth At Bedtime Prn 8)  Dicyclomine Hcl 20 Mg Tabs (Dicyclomine Hcl) .... One By Mouth Four Times Daily 9)  Flonase 50 Mcg/act  Susp (Fluticasone Propionate) .[name].. 1 Spray Each Nostril Daily 10)  Sertraline Hcl 100 Mg  Tabs (Sertraline Hcl) .... Two Tabs By Mouth Daily 11)  Atenolol 50 Mg  Tabs (Atenolol) .... One By Mouth Daily 12)  Enalapril Maleate 20 Mg  Tabs (Enalapril Maleate) .... One Tablet By Mouth Twice A Day 13)  Tussionex Pennkinetic Er 8-10 Mg/103m  Lqcr (Chlorpheniramine-Hydrocodone) .... One Teaspoon Every 12 Hours As Needed For Cough. Dispense 60 Ml or Standard Amount 14)  Flexeril 5 Mg  Tabs (Cyclobenzaprine Hcl) .... Take One By Mouth At Bedtime As Needed For Back Pain 15)  Amlodipine Besylate 10 Mg  Tabs (Amlodipine Besylate) .... One By Mouth Daily 16)  Ultram 50 Mg Tabs (Tramadol Hcl) .... One By Mouth Every 6 Hours 17)  Ventolin Hfa 108 (90 Base) Mcg/act Aers (Albuterol Sulfate) .... 2 Puffs Every Four Hours As Needed  Allergies (verified): No Known Drug Allergies  Past History:  Past Medical History: Last updated: 05/12/2008 Gout  274 DM type II HTN Osteoarthritis IBS  Past Surgical History: Last updated: 11/11/2008 echo 6/95 -,  Hysterectomy--partial -, L rotator cuff repair 11/07-[facility] - 03/14/2006,  shoulder surgery w/ Dr. KJenean Lindau-,  tonsillectomy -  Family History: Last updated: 01/12/2009 Mom w/ DM, HTN.  Mom had MI in 2000.   Sister w/ DM.  No breast Ca.  Social History: Last updated: 11/11/2008 Divorced '97.  Lives alone, smokes.  No etoh.  Applying for disabilty through [facility](ortho). Has trouble affording medications. Recently moved to 1st floor apartment housing (end  of June 2010). Likes it there.   Family History: Mom w/ DM, HTN.  Mom had MI in 2000.   Sister w/ DM.  No breast Ca.  Physical Exam  General:  Well-developed,well-nourished,in no acute distress; alert,appropriate and cooperative throughout examination.  Vitals reviewed. Head:  normocephalic and atraumatic.   Eyes:  pupils equal, pupils round, pupils reactive to light, and pupils react to accomodation.   Mouth:  Oral mucosa and oropharynx without lesions or exudates.   Neck:  supple, full ROM, and no masses.   Chest Wall:  no tenderness.   Lungs:  Normal respiratory effort, chest expands symmetrically. Lungs are  clear to auscultation, no crackles or wheezes. Heart:  Normal rate and regular rhythm. S1 and S2 normal without gallop, murmur, click, rub or other extra sounds. Abdomen:  soft, non-tender, normal bowel sounds, and no distention.   Msk:  normal ROM.   Pulses:  R and L carotid,dorsalis pedis and posterior are full and equal bilaterally Extremities:  no edema Neurologic:  alert & oriented X3  Skin:  Intact without suspicious lesions or rashes Cervical Nodes:  No lymphadenopathy noted   Impression & Recommendations:  Problem # 1:  CHEST PAIN (ICD-786.50) Assessment New Chest pain may be exertional or atypical in nature, but given pt's risk factors (age, htn, dm, tobacco use, family hx of coronary artery disease) and EKG changes, will admit to telemetry bed for further work-up.  Pt received ASA 346m total today.  O2 sat at 98% on RA.  Since pt is not having chest pain currently did not give NTG or place pt on morphine.  Pt's last EKG was in 2005 which showed sinus bradycardia, but otherwise normal.  Today's EKG showed lost of R-wave in lead V4.   Will get serial CE, first set stat.  Repeat EKG in AM.  Will also get 2-D echo to evaluate potential decrease motility given that EKG showed lost of R-wave.  Will keep pt NPO as pt may need to have cardiac catherization.  Will get CBC and  Cmet.  Will resume HTN meds in AM  Problem # 2:  HYPERTENSION (ICD-401.9) Assessment: Unchanged Stable.  Will continue home meds in AM.  Will check electrolytes and kidney function.  Her updated medication list for this problem includes:    Hydrochlorothiazide 25 Mg Tabs (Hydrochlorothiazide) ..[name].. Take 1 tablet by mouth once a day    Atenolol 50 Mg Tabs (Atenolol) ..... One by mouth daily    Enalapril Maleate 20 Mg Tabs (Enalapril maleate) ..... One tablet by mouth twice a day    Amlodipine Besylate 10 Mg Tabs (Amlodipine besylate) ..... One by mouth daily  Problem # 3:  DYSLIPIDEMIA (ICD-272.4) Assessment: Comment Only Pt was supposed to get FLP today.  Last direct LDL check in clinic was 106 11/2007.  Will get FLP in AM.    Problem # 4:  DIABETES MELLITUS II, UNCOMPLICATED (IKXF-818.29 Assessment: Unchanged A1C was 7% 11/11/2008.  Will continue home meds except for Actos, though pt does not have history of CHF.  Will wait for 2-D Echo result before resuming. Her updated medication list for this problem includes:    Actos 30 Mg Tabs (Pioglitazone hcl) ..... One by mouth daily    Bayer Childrens Aspirin 81 Mg Chew (Aspirin) ..[name].. Take 1 tablet by mouth once a day    Glucotrol Xl 10 Mg Tb24 (Glipizide) ..[name].. Take 2 tablet by mouth once a day    Enalapril Maleate 20 Mg Tabs (Enalapril maleate) ..... One tablet by mouth twice a day  Problem # 5:  TOBACCO DEPENDENCE (ICD-305.1) Smokes between 1/2 to 1 ppd x 30 yrs.  Nicotine patch while inpatient.  Smoking Cessation consult.  Problem # 6:  GOUT NOS (ICD-274.9) Continue allupurinol 2053mdaily for gout.  May consider d/c HCTZ if pt is in acute flair.  Problem # 7:  DEPRESSION, MAJOR, RECURRENT (ICD-296.30) Continue home Sertraline.  Problem # 8:  HIP PAIN, BILATERAL (ICD-719.45) Pt does not complain of pain currently.  I've not re-started Flexeril or Ultram due to concerns for masking of chest pain.  Primary team may restart once they've  seen pt. Her updated medication list for this problem includes:    Flexeril 5 Mg Tabs (Cyclobenzaprine hcl) .[name]... Take one by mouth at bedtime as needed for back pain    Ultram 50 Mg Tabs (Tramadol hcl) ..... One by mouth every 6 hours  Problem # 9:  FEN/PO NPO.  D5 1/2 NS at 125 cc/hr.  Checking electrolytes.  Problem # 10:  Prophylaxis Heparin 5000 units Subcutaneously Q 8  Problem # 11:  Dispo Pending cardiac work-up.    Complete Medication List: 1)  Actos 30 Mg Tabs (Pioglitazone hcl) .... One by mouth daily 2)  Allopurinol 200 Mg Tab  .... Take 1 tab by mouth daily for gout 3)  Bayer Childrens Aspirin 81 Mg Chew (Aspirin) .... Take 1 tablet by mouth once a day 4)  Claritin 10 Mg Tabs (Loratadine) .... Take 1 tablet by mouth once a day 5)  Glucotrol Xl 10 Mg Tb24 (Glipizide) .... Take 2 tablet by mouth once a day 6)  Hydrochlorothiazide 25 Mg Tabs (Hydrochlorothiazide) .... Take 1 tablet by mouth once a day 7)  Trazamine 50 Mg Misc (Trazodone & diet manage prod) .[name].. 1 tab by mouth at bedtime prn 8)  Dicyclomine Hcl 20 Mg Tabs (Dicyclomine hcl) .... One by mouth four times daily 9)  Flonase 50 Mcg/act Susp (Fluticasone propionate) .[name].. 1 spray each nostril daily 10)  Sertraline Hcl 100 Mg Tabs (Sertraline hcl) .... Two tabs by mouth daily 11)  Atenolol 50 Mg Tabs (Atenolol) .... One by mouth daily 12)  Enalapril Maleate 20 Mg Tabs (Enalapril maleate) .... One tablet by mouth twice a day 13)  Tussionex Pennkinetic Er 8-10 Mg/9m Lqcr (Chlorpheniramine-hydrocodone) .... One teaspoon every 12 hours as needed for cough. dispense 60 ml or standard amount 14)  Flexeril 5 Mg Tabs (Cyclobenzaprine hcl) .... Take one by mouth at bedtime as needed for back pain 15)  Amlodipine Besylate 10 Mg Tabs (Amlodipine besylate) .... One by mouth daily 16)  Ultram 50 Mg Tabs (Tramadol hcl) .... One by mouth every 6 hours 17)  Ventolin Hfa 108 (90 Base) Mcg/act Aers (Albuterol sulfate) .... 2 puffs  every four hours as needed  pt given ASA 321mat 11:25. lot #[id number] exp. 1.10.11. major. NeApril MansonMA  January 12, 2009 11:27 AM

## 2010-05-08 NOTE — Assessment & Plan Note (Signed)
Summary: diabetes cu/dlh   Vital Signs:  Patient profile:   65 year old female Height:      63 inches Weight:      219 pounds BMI:     38.93 BSA:     2.01 Temp:     97.5 degrees F Pulse rate:   59 / minute BP sitting:   122 / 78  Vitals Entered By: Christen Bame CMA (November 11, 2008 9:21 AM)  Serial Vital Signs/Assessments:  Comments: 9:24 AM Manual BP: 122/78 By: Christen Bame CMA   CC: f/u DM Pain Assessment Patient in pain? no        Primary Care Provider:  [name]  MD  CC:  f/u DM.  History of Present Illness: 1. Diabetes Last A1C 6.9. Pt states that she's tolerating her medications without difficulty. Watches sugar intake but admits that she eats a lot of bread, potatoes and rice. Weight has been stable for over 1 year. Is trying to exercise more but has had transportation issues over the past month.  2. Smoking Won't tell me how much she's smoking. Had PFTs done in June here with Dr. Valentina Lucks. Assessment was mod-severe restriction with significant improvement with albuterol. Pt states that she frequently coughs and sometimes wheeze. States that she's decided to quit smoking and is going to start using the the gum soon.  3. Gout Says insurance won't pay for colchicine. Taking allopurinol without problems. Thinks her gout mostly affects her ankels and elbows. No podagra. Has not had a uric acid checked recently.   Habits & Providers  Alcohol-Tobacco-Diet     Tobacco Status: current     Tobacco Counseling: to quit use of tobacco products  Current Medications (verified): 1)  Actos 30 Mg Tabs (Pioglitazone Hcl) .... One By Mouth Daily 2)  Allopurinol 100 Mg Tabs (Allopurinol) .[name].. 1 Tablet By Mouth Once A Day 3)  Bayer Childrens Aspirin 81 Mg Chew (Aspirin) .... Take 1 Tablet By Mouth Once A Day 4)  Claritin 10 Mg Tabs (Loratadine) .... Take 1 Tablet By Mouth Once A Day 5)  Glucotrol Xl 10 Mg Tb24 (Glipizide) .... Take 2 Tablet By Mouth Once A Day 6)   Hydrochlorothiazide 25 Mg Tabs (Hydrochlorothiazide) .... Take 1 Tablet By Mouth Once A Day 7)  Trazamine 50 Mg  Misc (Trazodone & Diet Manage Prod) .[name].. 1 Tab By Mouth At Bedtime Prn 8)  Dicyclomine Hcl 20 Mg Tabs (Dicyclomine Hcl) .... One By Mouth Four Times Daily 9)  Flonase 50 Mcg/act  Susp (Fluticasone Propionate) .[name].. 1 Spray Each Nostril Daily 10)  Sertraline Hcl 100 Mg  Tabs (Sertraline Hcl) .... Two Tabs By Mouth Daily 11)  Atenolol 50 Mg  Tabs (Atenolol) .... One By Mouth Daily 12)  Enalapril Maleate 20 Mg  Tabs (Enalapril Maleate) .... One Tablet By Mouth Twice A Day 13)  Tussionex Pennkinetic Er 8-10 Mg/18m  Lqcr (Chlorpheniramine-Hydrocodone) .... One Teaspoon Every 12 Hours As Needed For Cough. Dispense 60 Ml or Standard Amount 14)  Flexeril 5 Mg  Tabs (Cyclobenzaprine Hcl) .... Take One By Mouth At Bedtime As Needed For Back Pain 15)  Amlodipine Besylate 10 Mg  Tabs (Amlodipine Besylate) .... One By Mouth Daily 16)  Ultram 50 Mg Tabs (Tramadol Hcl) .... One By Mouth Every 6 Hours 17)  Ventolin Hfa 108 (90 Base) Mcg/act Aers (Albuterol Sulfate) .... 2 Puffs Every Four Hours As Needed  Allergies (verified): No Known Drug Allergies  Past History:  Past Surgical  History: echo 6/95 -,  Hysterectomy--partial -, L rotator cuff repair 11/07-[facility] - 03/14/2006,  shoulder surgery w/ Dr. Jenean Lindau -,  tonsillectomy -  Social History: Divorced '97.  Lives alone, smokes.  No etoh.  Applying for disabilty through [facility] (ortho). Has trouble affording medications. Recently moved to 1st floor apartment housing (end of June 2010). Likes it there.   Review of Systems       deneis nausea/vomiting; denies blood in stool or urine; denies chest pain or headaches. Occasional knee pain that limits exercise.   Physical Exam  General:  Vital signs reviewed -- obese but otherwise normal Alert, appropriate; well-dressed and well-nourished  Neck:  no JVD; no carotid bruits Lungs:  work of  breathing unlabored; somewhat diminished breath sounds diffusely; no wheezes, rales, or ronchi  Heart:  regular rate and rhythm, no murmurs; normal s1/s2  Abdomen:  soft, non-tender, normal bowel sounds, no distention, no guarding, and no rebound tenderness.   Msk:  no bony deformity, gouty tophi in feet bilaterally. Pulses:  2+ DP and radial pulses Extremities:  no cyanosis, clubbing, or edema  Skin:  turgor normal, color normal, no rashes, and no suspicious lesions.     Impression & Recommendations:  Problem # 1:  DIABETES MELLITUS II, UNCOMPLICATED (SWH-675.91) Assessment Unchanged A1C slightly up. On ACEi, BP at goal. will plan to recheck LDL soon. Continue current treatment. Counseled to reduce carbs and work on losing weight.  Her updated medication list for this problem includes:    Actos 30 Mg Tabs (Pioglitazone hcl) ..... One by mouth daily    Bayer Childrens Aspirin 81 Mg Chew (Aspirin) .[name]... Take 1 tablet by mouth once a day    Glucotrol Xl 10 Mg Tb24 (Glipizide) .[name]... Take 2 tablet by mouth once a day    Enalapril Maleate 20 Mg Tabs (Enalapril maleate) ..... One tablet by mouth twice a day  Orders: A1C-[facility] ([id number]) [facility]- Est  Level 4 (65993)  Problem # 2:  TOBACCO DEPENDENCE (ICD-305.1) Assessment: Unchanged  Mod/severe restriction on PFTs. Will rx with albuterol MDI. Pt states that she has decided to quit. Has tried chantix in the past without success. Plans on using the gum this time. Sounds like the PFT testing got her attention.   Orders: [place]- Est  Level 4 (57017)  Problem # 3:  GOUT NOS (ICD-274.9) Assessment: Unchanged  Unclear of foot pain represents OA or gout. Will check uric acid and adjust allopurinol accordingly. Pt takes aleve as needed for joint aches and pains. Not taking colchicine.  The following medications were removed from the medication list:    Colchicine 0.6 Mg Tabs (Colchicine) ..... One by mouth by mouth daily Her updated medication list for  this problem includes:    Allopurinol 100 Mg Tabs (Allopurinol) .[name]... 1 tablet by mouth once a day  Orders: [facility]- Est  Level 4 (99214)Future Orders: Uric Acid-[facility] ([id number]) ... 11/11/2009  Complete Medication List: 1)  Actos 30 Mg Tabs (Pioglitazone hcl) .... One by mouth daily 2)  Allopurinol 100 Mg Tabs (Allopurinol) .[name].. 1 tablet by mouth once a day 3)  Bayer Childrens Aspirin 81 Mg Chew (Aspirin) .... Take 1 tablet by mouth once a day 4)  Claritin 10 Mg Tabs (Loratadine) .... Take 1 tablet by mouth once a day 5)  Glucotrol Xl 10 Mg Tb24 (Glipizide) .... Take 2 tablet by mouth once a day 6)  Hydrochlorothiazide 25 Mg Tabs (Hydrochlorothiazide) .... Take 1 tablet by mouth once a day 7)  Trazamine 50 Mg Misc (Trazodone & diet manage prod) .[name].. 1 tab by mouth at bedtime prn 8)  Dicyclomine Hcl 20 Mg Tabs (Dicyclomine hcl) .... One by mouth four times daily 9)  Flonase 50 Mcg/act Susp (Fluticasone propionate) .[name].. 1 spray each nostril daily 10)  Sertraline Hcl 100 Mg Tabs (Sertraline hcl) .... Two tabs by mouth daily 11)  Atenolol 50 Mg Tabs (Atenolol) .... One by mouth daily 12)  Enalapril Maleate 20 Mg Tabs (Enalapril maleate) .... One tablet by mouth twice a day 13)  Tussionex Pennkinetic Er 8-10 Mg/47m Lqcr (Chlorpheniramine-hydrocodone) .... One teaspoon every 12 hours as needed for cough. dispense 60 ml or standard amount 14)  Flexeril 5 Mg Tabs (Cyclobenzaprine hcl) .... Take one by mouth at bedtime as needed for back pain 15)  Amlodipine Besylate 10 Mg Tabs (Amlodipine besylate) .... One by mouth daily 16)  Ultram 50 Mg Tabs (Tramadol hcl) .... One by mouth every 6 hours 17)  Ventolin Hfa 108 (90 Base) Mcg/act Aers (Albuterol sulfate) .... 2 puffs every four hours as needed  Other Orders: Future Orders: Comp Met-[facility] (([id number] ... 11/11/2009 Direct LDL-[facility] [phone] ... 11/11/2009  Patient Instructions: 1)  take the albuterol as needed for shortness of breath or  whieezing. 2)  It's wonderful that you've decided to stop smoking. Let me know if you have any further questions. 3)  follow-up with me in 1-2 months 4)  come back in 1-2 weeks for bloodwork. 5)  Great to see you. Prescriptions: VENTOLIN HFA 108 (90 BASE) MCG/ACT AERS (ALBUTEROL SULFATE) 2 puffs every four hours as needed  #1 x 3   Entered and Authorized by:   TEugenie Norrie MD   Signed by:   TEugenie Norrie MD on 11/11/2008   Method used:   Faxed to ...       [facility] (retail)       [address]  [address]      Ph: [phone]      Fax: [phone]  RxID:   [id number] HFA 108 (90 BASE) MCG/ACT AERS (ALBUTEROL SULFATE) 2 puffs every four hours as needed  #1 x 3   Entered and Authorized by:   TEugenie Norrie MD   Signed by:   TEugenie Norrie MD on 11/11/2008   Method used:   Print then Give to Patient   RxID:   [id number]  Laboratory Results   Blood Tests   Date/Time Received: November 11, 2008 9:08 AM  Date/Time Reported: November 11, 2008 9:23 AM   HGBA1C: 7.0%   (Normal Range: Non-Diabetic - 3-6%   Control Diabetic - 6-8%)  Comments: ...............test performed by.......Marland Kitchenonnie A. JMartinique MT (ASCP)         Other Screening   Pap smear: normal  (06/23/2007)   Pap smear due: 06/23/2010    Mammogram: Normal  (12/22/2007)   Mammogram due: 12/21/2008    DXA bone density scan: Not documented   Smoking status: current  (11/11/2008)   Smoking cessation counseling: yes  (12/28/2007)   CMP ordered   CMP ordered

## 2010-05-08 NOTE — Assessment & Plan Note (Signed)
Summary: boil on bottom,df   Vital Signs:  Patient profile:   65 year old female Weight:      221.6 pounds Temp:     98.2 degrees F oral Pulse rate:   62 / minute Pulse rhythm:   regular BP sitting:   128 / 76  (left arm)  Vitals Entered By: Loralee Pacas CMA (June 19, 2009 10:48 AM)   Primary Care Provider:  Myrtie Soman  MD   History of Present Illness: 1. ? boil Had a "rising"  on her rectal area on Wednesday. Had a friend look at it and states that it is red and pimple-like. Hurt Wednesday and Thursday, feels much better today. Used Tuck's pad and soaked in epson salts.  ROS: no fevers or chills, sugars have been okay. No problems eating or drinking.   Current Medications (verified): 1)  Actos 30 Mg Tabs (Pioglitazone Hcl) .... One By Mouth Daily 2)  Allopurinol 300 Mg Tabs (Allopurinol) .... One By Mouth Daily To Prevent Gout 3)  Bayer Childrens Aspirin 81 Mg Chew (Aspirin) .... Take 1 Tablet By Mouth Once A Day 4)  Claritin 10 Mg Tabs (Loratadine) .... Take 1 Tablet By Mouth Once A Day 5)  Glucotrol Xl 10 Mg Tb24 (Glipizide) .... Take 2 Tablet By Mouth Once A Day 6)  Hydrochlorothiazide 25 Mg Tabs (Hydrochlorothiazide) .... Take 1 Tablet By Mouth Once A Day 7)  Dicyclomine Hcl 20 Mg Tabs (Dicyclomine Hcl) .... One By Mouth Four Times Daily 8)  Flonase 50 Mcg/act  Susp (Fluticasone Propionate) .[name].. 1 Spray Each Nostril Daily 9)  Sertraline Hcl 100 Mg  Tabs (Sertraline Hcl) .... Two Tabs By Mouth Daily 10)  Atenolol 50 Mg  Tabs (Atenolol) .... One By Mouth Daily 11)  Enalapril Maleate 20 Mg  Tabs (Enalapril Maleate) .... One Tablet By Mouth Twice A Day 12)  Guaifenesin-Codeine 100-10 Mg/48ml Liqd (Guaifenesin-Codeine) .... Take 1-2 Teaspoons Every 6 Hours As Needed For Cough; Disp 60 Cc 13)  Flexeril 5 Mg  Tabs (Cyclobenzaprine Hcl) .... Take One By Mouth At Bedtime As Needed For Back Pain 14)  Amlodipine Besylate 10 Mg  Tabs (Amlodipine Besylate) .... One By Mouth  Daily 15)  Nitrostat 0.4 Mg Subl (Nitroglycerin) .... One By Mouth Sublingually As Needed For Chest Pain 16)  Lipitor 10 Mg Tabs (Atorvastatin Calcium) .... One By Mouth Daily For Cholesterol 17)  Chantix Continuing Month Pak 1 Mg Tabs (Varenicline Tartrate) .... Take As Directed  Allergies (verified): No Known Drug Allergies  Review of Systems       review of systems as noted in HPI section   Physical Exam  General:  Vital signs reviewed -- obese but otherwise normal Alert, appropriate; well-dressed and well-nourished  Skin:  perirectal papular lesion with central red area at the 7 oclock position in the perianal area. Area measures about 4 mm in diameter. No drainage or discharge. Somewhat tender to palpation.   skin otherwise intact with good turgor.    Impression & Recommendations:  Problem # 1:  FURUNCLE (ICD-680.9) Assessment New  healing well. No indication for I&D at this time. Continue supportive care.   Orders: [facility]- Est Level  3 (42595)  Complete Medication List: 1)  Actos 30 Mg Tabs (Pioglitazone hcl) .... One by mouth daily 2)  Allopurinol 300 Mg Tabs (Allopurinol) .... One by mouth daily to prevent gout 3)  Bayer Childrens Aspirin 81 Mg Chew (Aspirin) .... Take 1 tablet by mouth once a day 4)  Claritin 10 Mg Tabs (Loratadine) .... Take 1 tablet by mouth once a day 5)  Glucotrol Xl 10 Mg Tb24 (Glipizide) .... Take 2 tablet by mouth once a day 6)  Hydrochlorothiazide 25 Mg Tabs (Hydrochlorothiazide) .... Take 1 tablet by mouth once a day 7)  Dicyclomine Hcl 20 Mg Tabs (Dicyclomine hcl) .... One by mouth four times daily 8)  Flonase 50 Mcg/act Susp (Fluticasone propionate) .[name].. 1 spray each nostril daily 9)  Sertraline Hcl 100 Mg Tabs (Sertraline hcl) .... Two tabs by mouth daily 10)  Atenolol 50 Mg Tabs (Atenolol) .... One by mouth daily 11)  Enalapril Maleate 20 Mg Tabs (Enalapril maleate) .... One tablet by mouth twice a day 12)  Guaifenesin-codeine 100-10  Mg/74ml Liqd (Guaifenesin-codeine) .... Take 1-2 teaspoons every 6 hours as needed for cough; disp 60 cc 13)  Flexeril 5 Mg Tabs (Cyclobenzaprine hcl) .... Take one by mouth at bedtime as needed for back pain 14)  Amlodipine Besylate 10 Mg Tabs (Amlodipine besylate) .... One by mouth daily 15)  Nitrostat 0.4 Mg Subl (Nitroglycerin) .... One by mouth sublingually as needed for chest pain 16)  Lipitor 10 Mg Tabs (Atorvastatin calcium) .... One by mouth daily for cholesterol 17)  Chantix Continuing Month Pak 1 Mg Tabs (Varenicline tartrate) .... Take as directed  Patient Instructions: 1)  continue the tucks pad and baths in warm water and epsom salts 2-3 times per day. 2)  make sure your stools are soft. 3)  if you get fevers or chills, nausea or vomiting.   Prevention & Chronic Care Immunizations   Influenza vaccine: Fluvax 3+  (01/31/2009)   Influenza vaccine due: 12/27/2008    Tetanus booster: 06/06/2004: Done.   Tetanus booster due: 06/07/2014    Pneumococcal vaccine: Done.  (03/08/2004)   Pneumococcal vaccine due: None    H. zoster vaccine: 03/08/2008: .  Colorectal Screening   Hemoccult: Done.  (09/07/2003)   Hemoccult due: Not Indicated    Colonoscopy: normal  (04/19/2004)   Colonoscopy due: 04/19/2014  Other Screening   Pap smear: normal  (06/23/2007)   Pap smear due: 06/23/2010    Mammogram: ASSESSMENT: Negative - BI-RADS 1^MM DIGITAL SCREENING  (12/30/2008)   Mammogram due: 12/21/2008    DXA bone density scan: Not documented   Smoking status: current  (06/12/2009)   Smoking cessation counseling: yes  (12/28/2007)   Target quit date: 05/23/2009  (05/02/2009)  Diabetes Mellitus   HgbA1C: 6.4  (04/26/2009)   Hemoglobin A1C due: 01/20/2008    Eye exam: normal  (11/14/2008)   Eye exam due: 11/15/2008    Foot exam: yes  (01/04/2009)   Foot exam action/deferral: Do today   High risk foot: Not documented   Foot care education: Not documented   Foot exam due:  12/27/2008    Urine microalbumin/creatinine ratio: Not documented   Urine microalbumin/cr due: 11/26/2008  Lipids   Total Cholesterol: 160  (03/13/2009)   LDL: See Comment mg/dL  (04/16/3233)   LDL Direct: 82.4  (03/13/2009)   HDL: 26.90  (03/13/2009)   Triglycerides: 359.0  (03/13/2009)    SGOT (AST): 15  (04/26/2009)   SGPT (ALT): 16  (04/26/2009)   Alkaline phosphatase: 101  (04/26/2009)   Total bilirubin: 0.3  (04/26/2009)  Hypertension   Last Blood Pressure: 128 / 76  (06/19/2009)   Serum creatinine: 0.78  (04/26/2009)   Serum potassium 4.4  (04/26/2009)  Self-Management Support :   Personal Goals (by the next clinic visit) :  Personal A1C goal: 7  (01/04/2009)     Personal blood pressure goal: 130/80  (01/04/2009)     Personal LDL goal: 70  (01/04/2009)    Diabetes self-management support: Written self-care plan  (06/12/2009)    Diabetes self-management support not done because: Good outcomes  (04/26/2009)    Hypertension self-management support: Written self-care plan  (06/12/2009)    Hypertension self-management support not done because: Good outcomes  (06/12/2009)    Lipid self-management support: Written self-care plan  (06/12/2009)     Lipid self-management support not done because: Good outcomes  (06/12/2009)

## 2010-05-08 NOTE — Progress Notes (Signed)
Summary: status of paperwork  Phone Note From Other Clinic Call back at [phone]   Caller: [facility] [employer]angela Summary of Call: checking status of paperwork that was refaxed to [place] on 1/28, needs to know if we received it and what the status is? Initial call taken by: ERIN LEVAN,  May 20, 2007 2:50 PM  Follow-up for Phone Call        form completed and faxed to [facility] [employer] Follow-up by: Geanie Cooley RN,  May 20, 2007 4:44 PM

## 2010-05-08 NOTE — Progress Notes (Signed)
Summary: Nuc. Pre-Procedure  Phone Note Outgoing Call Call back at [facility] Phone [phone]   Call placed by: Irven Baltimore, RN,  March 09, 2009 1:42 PM Summary of Call: Reviewed information on [employer] Information Sheet (see scanned document for further details).  Spoke with patient.     Nuclear Med Background Indications for Stress Test: Evaluation for Ischemia  Indications Comments: Admission 01/12/09: CP, (-) enzymes.  History: COPD, Echo  History Comments: 10/10 Echo: EF=60-65%,LVH, mild AS.   Symptoms Comments: Cough   Nuclear Pre-Procedure Cardiac Risk Factors: Family History - CAD, Hypertension, Lipids, NIDDM, Smoker Height (in): 22

## 2010-05-08 NOTE — Miscellaneous (Signed)
  Clinical Lists Changes  Orders: Added new Test order of Cardiology Other (Cardiology Other) - Signed

## 2010-05-08 NOTE — Assessment & Plan Note (Signed)
Summary: nutr. appt/eo   Vital Signs:  Patient profile:   65 year old female Height:      62 inches Weight:      220.1 pounds BMI:     40.40  Vitals Entered By: Iver Nestle PHD (July 13, 2009 1:39 PM)  Primary Care Provider:  [name]  MD   History of Present Illness: Assessment: Met with pt for 15 minutes.  Due to a misunderstanding re. appt time, Bonnie Peterson was unable to stay for full scheduled appt, but will RTC in one wk.  24-hr recall suggests kcal intake of  ~1100: B (8 AM)- coffee w/ equal, 1 scrambled egg in low-fat margarine, 3 slc [place] bacon; L (12:30 PM)- 1.5 c carrot-raisin salad made w/ reg mayo; Snk (4:30 PM)- 3 c fruit; D (6:30 PM)-  ~2 c chx salad, 1/2 c spinach w/ 1/2 tsp low-fat margarine, water; Snk (10 PM)- 2 c cantaloupe, water.  Fasting BG has been 132-162.   Nutrition Diagnosis:  Inappropriate intake of types of carbohydrate (NI-53.3) related to fruit (and possibly beverages) as evidenced by at least 3 c fruit at one serving and daily intake of  ~32 oz green tea, contents of which are unknown to pt.    Intervention: See Patient Instructions.    Monitoring/Eval: Pt will schedule to return in one week.     Allergies: No Known Drug Allergies   Complete Medication List: 1)  Actos 30 Mg Tabs (Pioglitazone hcl) .... One by mouth daily 2)  Allopurinol 300 Mg Tabs (Allopurinol) .... One by mouth daily to prevent gout 3)  Bayer Childrens Aspirin 81 Mg Chew (Aspirin) .... Take 1 tablet by mouth once a day 4)  Claritin 10 Mg Tabs (Loratadine) .... Take 1 tablet by mouth once a day 5)  Glucotrol Xl 10 Mg Tb24 (Glipizide) .... Take 2 tablet by mouth once a day 6)  Hydrochlorothiazide 25 Mg Tabs (Hydrochlorothiazide) .... Take 1 tablet by mouth once a day 7)  Dicyclomine Hcl 20 Mg Tabs (Dicyclomine hcl) .... One by mouth four times daily 8)  Flonase 50 Mcg/act Susp (Fluticasone propionate) .[name].. 1 spray each nostril daily 9)  Sertraline Hcl 100 Mg Tabs  (Sertraline hcl) .... Two tabs by mouth daily 10)  Atenolol 50 Mg Tabs (Atenolol) .... One by mouth daily 11)  Enalapril Maleate 20 Mg Tabs (Enalapril maleate) .... One tablet by mouth twice a day 12)  Guaifenesin-codeine 100-10 Mg/35m Liqd (Guaifenesin-codeine) .... Take 1-2 teaspoons every 6 hours as needed for cough; disp 60 cc 13)  Flexeril 5 Mg Tabs (Cyclobenzaprine hcl) .... Take one by mouth at bedtime as needed for back pain 14)  Amlodipine Besylate 10 Mg Tabs (Amlodipine besylate) .... One by mouth daily 15)  Nitrostat 0.4 Mg Subl (Nitroglycerin) .... One by mouth sublingually as needed for chest pain 16)  Lipitor 10 Mg Tabs (Atorvastatin calcium) .... One by mouth daily for cholesterol 17)  Chantix Continuing Month Pak 1 Mg Tabs (Varenicline tartrate) .... Take as directed  Other Orders: Inital Assessment Each 154m - [id number] Patient Instructions: 1)  Check out the green tea you've been drinking, to see if it has sugar in it.   2)  Schedule follow-up appt in one week; allow one hour for your appt.

## 2010-05-08 NOTE — Progress Notes (Signed)
Summary: Returning call from Dr Damita Dunnings  Phone Note Call from Patient Call back at [facility] Phone [phone]   Summary of Call: pt is states she is returning call from Dr Damita Dunnings Initial call taken by: Drucie Ip,  August 05, 2006 8:33 AM  Follow-up for Phone Call        If patient calls back, tell her this:  Minimal elevation in ESR and RF and therefore unlikely to be RA; can redraw in 3-6 months if needed to follow up; sooner if symptoms dictate intervention.  I previously LMOVM that "labs were OK" and for her to call back if she had questions.  If she still has questions, I'll call her again. Follow-up by: Elsie Stain MD,  August 05, 2006 9:52 AM  Additional Follow-up for Phone Call Additional follow up Details #1::        attempted to call with no success.  Not able to reach VM Additional Follow-up by: Christen Bame CMA,  August 05, 2006 10:40 AM   Additional Follow-up for Phone Call Additional follow up Details #2::    attempted to call again Follow-up by: Christen Bame CMA,  August 05, 2006 4:58 PM  Additional Follow-up for Phone Call Additional follow up Details #3:: Details for Additional Follow-up Action Taken: Pt informed and expressed understanding Additional Follow-up by: Christen Bame CMA,  August 06, 2006 9:19 AM

## 2010-05-08 NOTE — Letter (Signed)
Summary: *Referral Letter  [facility]  [address]   [place], [place] [address]   Phone: [phone]  Fax: [phone]    06/10/2007  Thank you in advance for agreeing to see my patient:  Bonnie Peterson [address] [place], [place]  [address]  Phone: [phone]  Reason for Referral: vaginal discharge, pap smear  Procedures Requested: pap smear, other testing as indicated.  Current Medical Problems: 1)  COUGH (ICD-786.2) 2)  VACCINE AGAINST INFLUENZA (ICD-V04.81) 3)  GOUT NOS (ICD-274.9) 4)  SEBORRHEIC KERATOSIS (ICD-702.19) 5)  TOBACCO DEPENDENCE (ICD-305.1) 6)  RHINITIS, ALLERGIC (ICD-477.9) 7)  OSTEOARTHRITIS, MULTI SITES (ICD-715.98) 8)  MENOPAUSAL SYNDROME (ICD-627.2) 9)  IRRITABLE BOWEL SYNDROME (ICD-564.1) 10)  HYPERTENSION, BENIGN SYSTEMIC (ICD-401.1) 11)  DIABETES MELLITUS II, UNCOMPLICATED (ICD-250.00) 12)  DEPRESSION, MAJOR, RECURRENT (ICD-296.30) 13)  ANXIETY (ICD-300.00)   Current Medications: 1)  ACTOS 15 MG TABS (PIOGLITAZONE HCL) Take 1 tablet by mouth once a day 2)  ALLOPURINOL 100 MG TABS (ALLOPURINOL) 1 tablet by mouth once a day 3)  BAYER CHILDRENS ASPIRIN 81 MG CHEW (ASPIRIN) Take 1 tablet by mouth once a day 4)  CLARITIN 10 MG TABS (LORATADINE) Take 1 tablet by mouth once a day 5)  ENALAPRIL-HYDROCHLOROTHIAZIDE 5-12.5 MG  TABS (ENALAPRIL-HYDROCHLOROTHIAZIDE) take 2 tabs by mouth daily 6)  GLUCOTROL XL 10 MG TB24 (GLIPIZIDE) Take 2 tablet by mouth once a day 7)  HYDROCHLOROTHIAZIDE 25 MG TABS (HYDROCHLOROTHIAZIDE) Take 1 tablet by mouth once a day 8)  TRAZAMINE 50 MG  MISC (TRAZODONE & DIET MANAGE PROD) 1 tab by mouth at bedtime prn 9)  ROBAXIN 500 MG  TABS (METHOCARBAMOL) 1 tab daily 10)  HYOMAX-SL 0.125 MG  SUBL (HYOSCYAMINE SULFATE) 2 tabs by mouth once daily 11)  FLONASE 50 MCG/ACT  SUSP (FLUTICASONE PROPIONATE) 1 spray each nostril daily 12)  SERTRALINE HCL 100 MG  TABS (SERTRALINE HCL) two tabs by  mouth daily 13)  ATENOLOL 100 MG  TABS (ATENOLOL) take one by mouth daily 14)  ENALAPRIL MALEATE 10 MG  TABS (ENALAPRIL MALEATE) one by mouth daily 15)  TUSSIONEX PENNKINETIC ER 8-10 MG/5ML  LQCR (CHLORPHENIRAMINE-HYDROCODONE) one teaspoon every 12 hours as needed for cough. Dispense 60 ml or standard amount   Past Medical History: 1)  Gout  274 2)  DM type II 3)  HTN 4)  Osteoarthritis   Pt would like to see a gynecologist for her vaginal discharge and pap smear. Thank you again for agreeing to see our patient; please contact us if you have any further questions or need additional information.  Sincerely,    Myrtie Soman  MD

## 2010-05-08 NOTE — Progress Notes (Signed)
Summary: Rx Req  Phone Note Call from Patient Call back at Home Phone [phone]   Caller: Patient Summary of Call: pt would like to get rx for tussinex [phone]. Initial call taken by: Raymond Gurney,  February 20, 2009 9:40 AM  Follow-up for Phone Call        to pcp Follow-up by: Elige Radon RN,  February 20, 2009 12:35 PM    Prescriptions: Cathie Hoops ER 8-10 MG/5ML  LQCR (CHLORPHENIRAMINE-HYDROCODONE) one teaspoon every 12 hours as needed for cough. Dispense 60 ml or standard amount  #1 x 0   Entered and Authorized by:   [name]  MD   Signed by:   [name]  MD on 02/20/2009   Method used:   Telephoned to ...         RxID:   [id number]

## 2010-05-08 NOTE — Assessment & Plan Note (Signed)
Summary: rov for osa   Copy to:  Bonnie Peterson Primary Provider/Referring Provider:  Myrtie Soman  MD  CC:  6 month follow up. Pt states wears cpap 3/7 nights x 6-8 hrs a night and Pt states has no problems on machine. [name]  History of Present Illness: The pt comes in today for f/u of her osa.  She was started on cpap, and at the last visit she was scheduled to have an auto download done for 2 weeks to optimize her pressure.  We never received the download from the dme, but we have obtained today and reveals very poor compliance on the pt's part.  She tells me that she has trouble sleeping at night, but sleeps all hours of the day.  She tells me that her sleep schedule is "messed up".  She does not have any issues with the mask or pressure, but yet only wears no more than 3 times a week.  She believes the reason for her poor compliance is her sleep schedule.  Current Medications (verified): 1)  Actos 30 Mg Tabs (Pioglitazone Hcl) .... One By Mouth Daily 2)  Allopurinol 300 Mg Tabs (Allopurinol) .... One By Mouth Daily To Prevent Gout 3)  Bayer Childrens Aspirin 81 Mg Chew (Aspirin) .... Take 1 Tablet By Mouth Once A Day 4)  Claritin 10 Mg Tabs (Loratadine) .... Take 1 Tablet By Mouth Once A Day 5)  Glucotrol Xl 10 Mg Tb24 (Glipizide) .... Take 2 Tablet By Mouth Once A Day 6)  Hydrochlorothiazide 25 Mg Tabs (Hydrochlorothiazide) .... Take 1 Tablet By Mouth Once A Day 7)  Dicyclomine Hcl 20 Mg Tabs (Dicyclomine Hcl) .... One By Mouth Four Times Daily 8)  Flonase 50 Mcg/act  Susp (Fluticasone Propionate) .[name].. 1 Spray Each Nostril Daily 9)  Sertraline Hcl 100 Mg  Tabs (Sertraline Hcl) .[name].. 1 Tab  By Mouth Once Daily 10)  Atenolol 50 Mg  Tabs (Atenolol) .... One By Mouth Daily 11)  Enalapril Maleate 20 Mg  Tabs (Enalapril Maleate) .... One Tablet By Mouth Twice A Day 12)  Amlodipine Besylate 10 Mg  Tabs (Amlodipine Besylate) .... One By Mouth Daily 13)  Nitrostat 0.4 Mg Subl (Nitroglycerin) .... One  By Mouth Sublingually As Needed For Chest Pain 14)  Lipitor 10 Mg Tabs (Atorvastatin Calcium) .... One By Mouth Daily For Cholesterol  Allergies (verified): No Known Drug Allergies  Review of Systems       The patient complains of shortness of breath at rest, productive cough, hand/feet swelling, and joint stiffness or pain.  The patient denies shortness of breath with activity, non-productive cough, coughing up blood, chest pain, irregular heartbeats, acid heartburn, indigestion, loss of appetite, weight change, abdominal pain, difficulty swallowing, sore throat, tooth/dental problems, headaches, nasal congestion/difficulty breathing through nose, sneezing, itching, ear ache, anxiety, depression, rash, change in color of mucus, and fever.    Vital Signs:  Patient profile:   65 year old female Height:      62 inches Weight:      225.13 pounds O2 Sat:      96 % on Room air Temp:     98.0 degrees F oral Pulse rate:   59 / minute BP sitting:   110 / 62  (right arm) Cuff size:   large  Vitals Entered By: Carver Fila (December 19, 2009 8:50 AM)  O2 Flow:  Room air CC: 6 month follow up. Pt states wears cpap 3/7 nights x 6-8 hrs a night, Pt states  has no problems on machine.  Comments meds and allergies updated Phone number updated Carver Fila  December 19, 2009 8:52 AM    Physical Exam  General:  obese female in nad Nose:  no skin breakdown or pressure necrosis from cpap mask Extremities:  mild edema, no cyanosis  Neurologic:  alert, does not appear sleepy, moves all 4.   Impression & Recommendations:  Problem # 1:  OBSTRUCTIVE SLEEP APNEA (ICD-327.23) the pt is not wearing cpap compliantly, and obviously has significant sleep hygiene issues as well.  I have discussed behavioral techniques with her in order to get her schedule back on track, and also discussed what constitutes good sleep hygiene.  I have explained to her that we have never optimized her pressure, and I would  like to check again on auto mode to achieve this.  I have also encouraged her to work aggressively on weight loss.  Other Orders: Est. Patient Level III ([address]) DME Referral (DME)  Patient Instructions: 1)  will get your machine set on automatic setting for 2 weeks so that we can find your best pressure.  Will need to get a download off your machine at that time, and then will let you know where your machine needs to be set. 2)  work on weight loss 3)  do not nap during the day!!, try and get your sleep back on schedule. 4)  followup with me in one year if doing well, but let me know if having issues with your machine.

## 2010-05-08 NOTE — Assessment & Plan Note (Signed)
Summary: f/u,df   Vital Signs:  Patient Profile:   65 Years Old Female Height:     63 inches Weight:      216.6 pounds BMI:     38.51 Temp:     97.2 degrees F oral Pulse rate:   75 / minute BP sitting:   121 / 60  (left arm) Cuff size:   regular  Pt. in pain?   yes    Location:   knees    Intensity:   4  Vitals Entered By: Garen Grams LPN (March 08, 2008 9:45 AM)                  PCP:  Myrtie Soman  MD  Chief Complaint:  f/u visit.  History of Present Illness: 1. Gout Pt states that week-long course of colchicine improved her gout attact significantly. Requests a refill in case she gets another. Had some diarrhea with this med but did not force her to discontinue it.  2. diabetes A1C worse today at 7.7. Has gained 2 lbs since last office visit. No n/v. States that she's adhering to her diet. No problems with actos or glucotrol. No hypoglycemic episodes. Sugars mostly in the 100s to 170s.   3. HTN BP initially elevated to 150s over 70s but 121/60 on my manual measurement. Pt denies any headache, vision changes, slurred speech. Tolerating medications, including amlodipine which is fairly new, without difficulty. Discussed goal BP in the context of diabetes.  4. Smoking Would like to quit. Smokes less than one PPD but won't clarify exactly how much. Verbalizes health risks of smoking.    Past Medical History:    Reviewed history from 05/29/2007 and no changes required:       Gout  274       DM type II       HTN       Osteoarthritis   Social History:    Divorced '97.  Lives alone, smokes.  No etoh.  Applying for disabilty through [facility] (ortho). Has trouble affording medications. Is trying to apply for 1st floor housing but will have to re-apply January 2010     Physical Exam  General:     alert and overweight-appearing.   Lungs:     Normal respiratory effort, chest expands symmetrically. Lungs are clear to auscultation, no crackles or  wheezes. Heart:     Normal rate and regular rhythm. S1 and S2 normal without gallop, murmur, click, rub or other extra sounds. Abdomen:     soft, non-tender, normal bowel sounds, no masses, and no guarding.   Pulses:     2+ DP and radial pulses    Impression & Recommendations:  Problem # 1:  HYPERTENSION, BENIGN SYSTEMIC (ICD-401.1) Assessment: Unchanged Doing well. Continue current regimen. Consider increase in atenolol vs addition of spirinolactone if BP deteriorates. Her updated medication list for this problem includes:    Hydrochlorothiazide 25 Mg Tabs (Hydrochlorothiazide) .[name]... Take 1 tablet by mouth once a day    Atenolol 50 Mg Tabs (Atenolol) ..... One by mouth daily    Enalapril Maleate 20 Mg Tabs (Enalapril maleate) ..... One tablet by mouth twice a day    Amlodipine Besylate 10 Mg Tabs (Amlodipine besylate) ..... One by mouth daily  Orders: [facility]- Est  Level 4 (37106)   Problem # 2:  DIABETES MELLITUS II, UNCOMPLICATED (ICD-250.00) Assessment: Deteriorated Pt amenable to increasing actos to 30mg  daily. Will do this and recheck A1C in 3-4 months. Eye  exams up todate. Her updated medication list for this problem includes:    Actos 30 Mg Tabs (Pioglitazone hcl) ..... One by mouth daily    Bayer Childrens Aspirin 81 Mg Chew (Aspirin) .[name]... Take 1 tablet by mouth once a day    Glucotrol Xl 10 Mg Tb24 (Glipizide) .[name]... Take 2 tablet by mouth once a day    Enalapril Maleate 20 Mg Tabs (Enalapril maleate) ..... One tablet by mouth twice a day  Orders: A1C-[facility] (16109) [facility]- Est  Level 4 (60454)   Problem # 3:  GOUT NOS (ICD-274.9) Assessment: Improved Refilled week-long supply of colchicine to have as needed.  Her updated medication list for this problem includes:    Allopurinol 100 Mg Tabs (Allopurinol) .[name]... 1 tablet by mouth once a day    Colchicine 0.6 Mg Tabs (Colchicine) ..... One by mouth by mouth daily  Orders: [facility]- Est  Level 4 (09811)   Problem # 4:   TOBACCO DEPENDENCE (ICD-305.1) Assessment: Unchanged Pt continues to want to quit. Has been seen in our smoking cessation clinic. Discussed health risks that this poses. Advised trying to cut back sequentially (one cig/day at a time). Pt agreeable to this. Continue to address. Orders: [facility]- Est  Level 4 (99214)   Complete Medication List: 1)  Actos 30 Mg Tabs (Pioglitazone hcl) .... One by mouth daily 2)  Allopurinol 100 Mg Tabs (Allopurinol) .[name].. 1 tablet by mouth once a day 3)  Bayer Childrens Aspirin 81 Mg Chew (Aspirin) .... Take 1 tablet by mouth once a day 4)  Claritin 10 Mg Tabs (Loratadine) .... Take 1 tablet by mouth once a day 5)  Glucotrol Xl 10 Mg Tb24 (Glipizide) .... Take 2 tablet by mouth once a day 6)  Hydrochlorothiazide 25 Mg Tabs (Hydrochlorothiazide) .... Take 1 tablet by mouth once a day 7)  Trazamine 50 Mg Misc (Trazodone & diet manage prod) .[name].. 1 tab by mouth at bedtime prn 8)  Hyomax-sl 0.125 Mg Subl (Hyoscyamine sulfate) .... 2 tabs by mouth once daily 9)  Flonase 50 Mcg/act Susp (Fluticasone propionate) .[name].. 1 spray each nostril daily 10)  Sertraline Hcl 100 Mg Tabs (Sertraline hcl) .... Two tabs by mouth daily 11)  Atenolol 50 Mg Tabs (Atenolol) .... One by mouth daily 12)  Enalapril Maleate 20 Mg Tabs (Enalapril maleate) .... One tablet by mouth twice a day 13)  Tussionex Pennkinetic Er 8-10 Mg/29ml Lqcr (Chlorpheniramine-hydrocodone) .... One teaspoon every 12 hours as needed for cough. dispense 60 ml or standard amount 14)  Flexeril 5 Mg Tabs (Cyclobenzaprine hcl) .... Take one by mouth at bedtime as needed for back pain 15)  Amlodipine Besylate 10 Mg Tabs (Amlodipine besylate) .... One by mouth daily 16)  Meclizine Hcl 25 Mg Tabs (Meclizine hcl) .... One by mouth 2-3 times a day as needed for vertigo 17)  Colchicine 0.6 Mg Tabs (Colchicine) .... One by mouth by mouth daily 18)  Ultram 50 Mg Tabs (Tramadol hcl) .... One by mouth every 6 hours   Patient  Instructions: 1)  follow-up in 2 months 2)  increase your actos to 30 mg a day (take 2 pills instead of one) - I've sent in a new prescription as well. 3)  I've refilled your colchicine for your next gout attack. 4)  Keep writing down your sugars. Start exercising 30 minutes a day 4-5 times per week. Make a goal of losing 5-10 pounds. 5)  Great to see you today.   Prescriptions: ACTOS 30 MG  TABS (PIOGLITAZONE HCL) one by mouth daily  #30 x 2   Entered and Authorized by:   Myrtie Soman  MD   Signed by:   Myrtie Soman  MD on 03/08/2008   Method used:   Electronically to        [facility] (retail)       [address]       [place], [place]  [address]       Ph: [phone]       Fax: [phone]   RxID:   [id number] COLCHICINE 0.6 MG TABS (COLCHICINE) one by mouth by mouth daily  #7 x 1   Entered and Authorized by:   Myrtie Soman  MD   Signed by:   Myrtie Soman  MD on 03/08/2008   Method used:   Electronically to        [facility] (retail)       [address]       [place], [place]  [address]       Ph: [phone]       Fax: [phone]   RxID:   [id number]  ] Herpes Zoster Result Date:  03/08/2008 Herpes Zoster Result:  . Herpes Zoster Next Due:  Refused  Laboratory Results   Blood Tests   Date/Time Received: March 08, 2008 9:42 AM  Date/Time Reported: March 08, 2008 9:51 AM   HGBA1C: 7.7%   (Normal Range: Non-Diabetic - 3-6%   Control Diabetic - 6-8%)  Comments: ...........test performed by...........Marland KitchenTerese Door, CMA

## 2010-05-08 NOTE — Assessment & Plan Note (Signed)
Summary: knee pain continues/ACM   Vital Signs:  Patient Profile:   65 Years Old Female Height:     63 inches Weight:      214.7 pounds BMI:     38.17 Temp:     97.1 degrees F Pulse rate:   70 / minute BP sitting:   165 / 80  (left arm)  Pt. in pain?   no  Vitals Entered By: Myrtie Soman  MD (October 14, 2007 4:32 PM)                  PCP:  Myrtie Soman  MD  Chief Complaint:  knee pain yest.  History of Present Illness: Pt reports for R knee pain that she states has resolved.  Pt scheduled this visit for several days of right knee pain and swelling that significantly interfered with ambulation. States that pain has since resolved. Has taken adivl 200 mg twice a day with pain and flexeril which she states helped. Is out of the the flexeril. No longer taking robaxin. States that knee joint was swollen. Is able to walk without difficulty today. Wonders if she should get an x-ray.  Has been taking diabetes and BP meds.     Past Medical History:    Reviewed history from 05/29/2007 and no changes required:       Gout  274       DM type II       HTN       Osteoarthritis  Past Surgical History:    Reviewed history from 06/05/2006 and no changes required:       echo 6/95 -, Hysterectomy--partial -, L rotator cuff repair 11/07-[facility] - 03/14/2006, shoulder surgery w/ Dr. Criss Alvine -, tonsillectomy -   Family History:    Reviewed history from 06/05/2006 and no changes required:       Mom w/ DM, HTN.  Sister w/ DM.  No breast Ca.  Social History:    Reviewed history from 01/23/2007 and no changes required:       Divorced '97.  Lives alone, smokes.  No etoh.  Applying for disabilty through [facility] (orth[facility]). Has trouble affording medications.     Physical Exam  General:     alert, well-hydrated, and overweight-appearing.   Lungs:     Normal respiratory effort, chest expands symmetrically. Lungs are clear to auscultation, no crackles or wheezes. Heart:     Normal rate and  regular rhythm. S1 and S2 normal without gallop, murmur, click, rub or other extra sounds. Msk:     R knee with mild swelling. No obvious effusion. Not ballotable. Mild crepitus with passive flexion. No joint line tenderness on the right. Mild lateral patellar tenderness.  L knee without effusion or swelling. Mild crepitus noted with passive flexion of left side as well. Skin:     Intact without suspicious lesions or rashes. Warm and well perfused.    Impression & Recommendations:  Problem # 1:  OSTEOARTHRITIS, MULTI SITES (ICD-715.98) OA likely related to this right knee pain however h/o of gout makes this a reasonable possibility. Advised pt to return at next sign of increased swelling. Continue allopurinol and consider colchicine for acute flares. Advised continuing advil 2-3 times per day. Will continue flexeril but this is likely more helpful for back pain that could be confused with R knee pain. Advised continued exercise. Her updated medication list for this problem includes:    Bayer Childrens Aspirin 81 Mg Chew (Aspirin) .[name]... Take 1  tablet by mouth once a day  Orders: [facility]- Est Level  3 (16109)   Problem # 2:  DIABETES MELLITUS II, UNCOMPLICATED (ICD-250.00) Will check A1C and lipids. States that sugars have been reasonably well controlled. Last A1C just above 7. Her updated medication list for this problem includes:    Actos 15 Mg Tabs (Pioglitazone hcl) .[name]... Take 1 tablet by mouth once a day    Bayer Childrens Aspirin 81 Mg Chew (Aspirin) .[name]... Take 1 tablet by mouth once a day    Glucotrol Xl 10 Mg Tb24 (Glipizide) .[name]... Take 2 tablet by mouth once a day    Enalapril Maleate 20 Mg Tabs (Enalapril maleate) ..... One tablet by mouth twice a day  Future Orders: Lipid-[facility] ([id number]) ... 10/13/2008 A1C-[facility] (91478) ... 10/13/2008   Problem # 3:  HYPERTENSION, BENIGN SYSTEMIC (ICD-401.1) Assessment: Unchanged 144/65 on manual recheck by me in room. Advised BP check at lab  appointment early next week. Discussed consideration of addition of CCB. Pt has had modest control of BP for a while now. Her updated medication list for this problem includes:    Hydrochlorothiazide 25 Mg Tabs (Hydrochlorothiazide) .[name]... Take 1 tablet by mouth once a day    Atenolol 100 Mg Tabs (Atenolol) .[name]... Take one by mouth daily    Enalapril Maleate 20 Mg Tabs (Enalapril maleate) ..... One tablet by mouth twice a day   Complete Medication List: 1)  Actos 15 Mg Tabs (Pioglitazone hcl) .... Take 1 tablet by mouth once a day 2)  Allopurinol 100 Mg Tabs (Allopurinol) .[name].. 1 tablet by mouth once a day 3)  Bayer Childrens Aspirin 81 Mg Chew (Aspirin) .... Take 1 tablet by mouth once a day 4)  Claritin 10 Mg Tabs (Loratadine) .... Take 1 tablet by mouth once a day 5)  Glucotrol Xl 10 Mg Tb24 (Glipizide) .... Take 2 tablet by mouth once a day 6)  Hydrochlorothiazide 25 Mg Tabs (Hydrochlorothiazide) .... Take 1 tablet by mouth once a day 7)  Trazamine 50 Mg Misc (Trazodone & diet manage prod) .[name].. 1 tab by mouth at bedtime prn 8)  Hyomax-sl 0.125 Mg Subl (Hyoscyamine sulfate) .... 2 tabs by mouth once daily 9)  Flonase 50 Mcg/act Susp (Fluticasone propionate) .[name].. 1 spray each nostril daily 10)  Sertraline Hcl 100 Mg Tabs (Sertraline hcl) .... Two tabs by mouth daily 11)  Atenolol 100 Mg Tabs (Atenolol) .... Take one by mouth daily 12)  Enalapril Maleate 20 Mg Tabs (Enalapril maleate) .... One tablet by mouth twice a day 13)  Tussionex Pennkinetic Er 8-10 Mg/105ml Lqcr (Chlorpheniramine-hydrocodone) .... One teaspoon every 12 hours as needed for cough. dispense 60 ml or standard amount 14)  Flexeril 5 Mg Tabs (Cyclobenzaprine hcl) .... Take one by mouth at bedtime as needed for back pain   Patient Instructions: 1)  Take Advil 200 mg 2-3 times a day for knee pain. 2)  Call for an appointment if swelling or pain return. 3)  Schedule a lab visit for early next week for cholesterol, diabetes, and  blood pressure check. 4)  We'll think about another blood pressure medicine if it's still high. 5)  Follow-up in 2-3 months.   ]

## 2010-05-08 NOTE — Letter (Signed)
Summary: CMN/HCS  CMN/HCS   Imported By: Bubba Hales 10/04/2009 08:45:42  _____________________________________________________________________  External Attachment:    Type:   Image     Comment:   External Document

## 2010-05-08 NOTE — Miscellaneous (Signed)
Summary: hyomax not covered  Clinical Lists Changes faxed form from her insurance. this med is not covered. message to pcp.Kennon Rounds CALDWELL RN  April 29, 2008 3:13 PM  Medications: Changed medication from HYOMAX-SL 0.125 MG  SUBL (HYOSCYAMINE SULFATE) 2 tabs by mouth once daily to HYOMAX 0.125 MG TABS (HYOSCYAMINE SULFATE) 2 tabs by mouth daily - Signed Rx of HYOMAX 0.125 MG TABS (HYOSCYAMINE SULFATE) 2 tabs by mouth daily;  #60 x 0;  Signed;  Entered by: Myrtie Soman  MD;  Authorized by: Myrtie Soman  MD;  Method used: Handwritten    Prescriptions: HYOMAX 0.125 MG TABS (HYOSCYAMINE SULFATE) 2 tabs by mouth daily  #60 x 0   Entered and Authorized by:   Myrtie Soman  MD   Signed by:   Myrtie Soman  MD on 05/05/2008   Method used:   Handwritten   RxID:   [id number]

## 2010-05-08 NOTE — Assessment & Plan Note (Signed)
Summary: flu shot wp  Nurse Visit   Vital Signs:  Patient Profile:   65 Years Old Female Height:     63 inches Temp:     98.2 degrees F                 Prior Medications: ACTOS 15 MG TABS (PIOGLITAZONE HCL) Take 1 tablet by mouth once a day ALLOPURINOL 100 MG TABS (ALLOPURINOL) 1 tablet by mouth once a day BAYER CHILDRENS ASPIRIN 81 MG CHEW (ASPIRIN) Take 1 tablet by mouth once a day CLARITIN 10 MG TABS (LORATADINE) Take 1 tablet by mouth once a day ENALAPRIL MALEATE 20 MG TABS (ENALAPRIL MALEATE) Take 1 tablet by mouth once a day GLUCOTROL XL 10 MG TB24 (GLIPIZIDE) Take 2 tablet by mouth once a day HYDROCHLOROTHIAZIDE 25 MG TABS (HYDROCHLOROTHIAZIDE) Take 1 tablet by mouth once a day METOPROLOL TARTRATE 100 MG TABS (METOPROLOL TARTRATE) Take 1 tablet by mouth twice a day TRAZAMINE 50 MG  MISC (TRAZODONE & DIET MANAGE PROD) 1 tab by mouth at bedtime prn ROBAXIN 500 MG  TABS (METHOCARBAMOL) 1 tab daily PREDNISONE 20 MG  TABS (PREDNISONE) 1 tab by mouth for 7 days HYOMAX-SL 0.125 MG  SUBL (HYOSCYAMINE SULFATE) 2 tabs by mouth once daily FLONASE 50 MCG/ACT  SUSP (FLUTICASONE PROPIONATE) 1 spray each nostril daily SERTRALINE HCL 100 MG  TABS (SERTRALINE HCL) two tabs by mouth daily    Influenza Vaccine    Vaccine Type: Fluvax 3+    Site: right deltoid    Mfr: Sanofi Pasteur    Dose: 0.5 ml    Route: IM    Given by: Elige Radon RN    Exp. Date: 10/06/2007    Lot #: [id number]    VIS given: 10/05/04 version given January 21, 2007.  Flu Vaccine Consent Questions    Do you have a history of severe allergic reactions to this vaccine? no    Any prior history of allergic reactions to egg and/or gelatin? no    Do you have a sensitivity to the preservative Thimersol? no    Do you have a past history of Guillan-Barre Syndrome? no    Do you currently have an acute febrile illness? no    Have you ever had a severe reaction to latex? no    Vaccine information given and explained to  patient? yes    Are you currently pregnant? no   Orders Added: 1)  Flu Vaccine 47yr + [90658] 2)  Admin 1st Vaccine [90471] 3)  [facility]   ]

## 2010-05-08 NOTE — Progress Notes (Signed)
Summary: In home health services  Phone Note Other Incoming Call back at [phone]   Call placed by: Janetta @ [facility] Summary of Call: Checking status of forms for in home health services. Forms were sent on 03/18/07.  Is wanting verbal order. Initial call taken by: Drucie Ip,  April 22, 2007 11:44 AM  Follow-up for Phone Call        told her the md would be addressing this tomorrow & we would send it when done Follow-up by: Elige Radon RN,  April 22, 2007 2:08 PM

## 2010-05-08 NOTE — Assessment & Plan Note (Signed)
Summary: fu wk   Vital Signs:  Patient Profile:   65 Years Old Female Height:     63 inches Weight:      207 pounds BMI:     36.80 BSA:     1.96 Temp:     98.0 degrees F Pulse rate:   66 / minute BP sitting:   162 / 82  Pt. in pain?   no  Vitals Entered By: Christen Bame CMA (July 24, 2006 9:21 AM)              Is Patient Diabetic? Yes    Chief Complaint:  F/U.  History of Present Illness: Diabetes:  Using medications without difficulties:y Hypoglycemic episodes:n Hyperglycemic episodes:n Feet problems:n Blood Sugars averaging: 120-130    Hypertension:    Using medication without problems or lightheadedness: y Chest pain with exertion: n Edema: occ bilateral leg edema. decrease with elevation Short of breath: n Average home BPs: n/a Other issues:.  can walk 1.5 miles.  arthritis. h/o mult site (shoulder, b knees).  also with bilateral hand/wrist/knuckle pain.  occ worse at night.  occ swelling at MCP, PIP, DIP joints.  no erythema.  no fevers nor other systemic symptoms.  mother with h/o RA.    Past Medical History:    Reviewed history from 06/05/2006 and no changes required:       Gout  274   Family History:    Reviewed history from 06/05/2006 and no changes required:       Mom w/ DM, HTN.  Sister w/ DM.  No breast Ca.  Social History:    Reviewed history from 06/05/2006 and no changes required:       Divorced '97.  Lives alone, h/o tobacco.  No etoh.  Applying for disabilty through [facility] ([facility])   Risk Factors:  Tobacco use:  current    Cigarettes:  Yes -- 1/4 pack(s) per day   Review of Systems       The patient complains of weight loss and peripheral edema.  The patient denies anorexia, fever, chest pain, syncope, dyspnea on exhertion, and abdominal pain.         intentional wt loss of 3lbs.    Physical Exam  General:     GEN: nad, alert and oriented HEENT: ncat, mmm NECK: supple CV: rrr PULM: ctab, no inc wob ABD: soft, +bs,  no masses, no hsm appreciated EXT: trace edema SKIN: no acute rash  MSK: no ulnar deviation of digits, no hand erythema/edema.  range of motion wnl bilateral hands.  chronic findings in shoulder exam unchanged.    Impression & Recommendations:  Problem # 1:  DIABETES MELLITUS II, UNCOMPLICATED (IZT-245.80) Assessment: Unchanged stable.  no change in meds.  meds refilled.  Orders: A1C-[facility] (99833) [facility]- Est  Level 4 (82505)   Problem # 2:  HYPERTENSION, BENIGN SYSTEMIC (ICD-401.1) Assessment: Deteriorated increase ACE and follow up for repeat BMET to follow K+/Cr.  patient aware.   Orders: [facility]- Est  Level 4 (99214)   Problem # 3:  OSTEOARTHRITIS, MULTI SITES (ICD-715.98) Assessment: Deteriorated likely OA; has follow up with ortho.  RA possible; will draw ESR and RF.  will contact patient with results. Orders: [facility]- Est  Level 4 (99214) Rheum Fact-[facility] (39767) Sed Rate (ESR)-[facility] (34193)    Patient Instructions: 1)  I'll send your meds to the pharmacy (with the higher dose of enalapril).  When you come back for your exam, we'll need to do blood work.  I  send your arthritis labs today. 2)  Please schedule a follow-up appointment in 1 month.  Laboratory Results   Blood Tests   Date/Time Recieved: July 24, 2006 9:24 AM  Date/Time Reported: July 24, 2006 9:33 AM   HGBA1C: 6.2%   (Normal Range: Non-Diabetic - 3-6%   Control Diabetic - 6-8%)  Comments: ...................................................................Karlton Lemon MD  July 24, 2006 9:33 AM     Appended Document: fu wk    Lab Visit  Laboratory Results   Blood Tests   Date/Time Recieved: July 24, 2006 10:59 AM  Date/Time Reported: July 24, 2006 12:18 PM   SED rate: 20  CBC WBC:  10.7   (Normal Range: 4.5-11.0) RBC:  4.86   (Normal Range 4.20-5.40) HGB:  12.8 g/dL   (Normal Range: 13.0-17.0 in Males, 12.0-15.0 in Females) Hct:  37.6 %   (Normal Range: 36.0-46.0) MCV:  77.4   (Normal Range:  80.0-100.0) Plt.:  381   (Normal Range: 150-450) Comments: ...................................................................Karlton Lemon MD  July 24, 2006 12:19 PM    Orders Today:  Appended Document: esr report    Lab Visit  Laboratory Results   Blood Tests   Date/Time Recieved: July 24, 2006 9.24 AM  Date/Time Reported: July 24, 2006 2:05 PM   SED rate: 42 mm/hr  Comments: ...............test performed by......Marland KitchenBonnie A. Martin[place], [place] (ASCP)     Orders Today:

## 2010-05-08 NOTE — Progress Notes (Signed)
  Phone Note Outgoing Call   Call placed by: Carollee Sires, RN, BSN,  April 13, 2009 10:53 AM Call placed to: Patient Details for Reason: Pulmonary Referral Summary of Call: Pulmonary Appointment. Initial call taken by: Carollee Sires, RN, BSN,  April 13, 2009 10:55 AM  Follow-up for Phone Call        Spoke with pt. 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 Pt. has an appointment with Dr. Ethelle Lyon MD Pulmonary, on January 20th at 2:45 PM. Pt to arrive at @ 2:30 PM. Pt aware. Follow-up by: Carollee Sires, RN, BSN,  April 13, 2009 11:02 AM

## 2010-05-08 NOTE — Progress Notes (Signed)
Summary: refill  Phone Note Refill Request Message from:  Patient  Refills Requested: Medication #1:  CHANTIX STARTING MONTH PAK 0.5 MG X 11 & 1 MG X 42 TABS Take as directed. Dispense one month - starting dose pack..   Notes: needs to be for continuing pack [facility] - fax [phone]  Initial call taken by: De Nurse,  June 02, 2009 4:17 PM    New/Updated Medications: CHANTIX CONTINUING MONTH PAK 1 MG TABS (VARENICLINE TARTRATE) take as directed Prescriptions: CHANTIX CONTINUING MONTH PAK 1 MG TABS (VARENICLINE TARTRATE) take as directed  #1 x 1   Entered and Authorized by:   Myrtie Soman  MD   Signed by:   Myrtie Soman  MD on 06/02/2009   Method used:   Faxed to ...       [facility] (retail)       [address]       [address]       [place], [place]  [address]       Ph: [phone]       Fax: [phone]   RxID:   [id number]

## 2010-05-08 NOTE — Assessment & Plan Note (Signed)
Summary: F/U BP   Vital Signs:  Patient Profile:   65 Years Old Female Height:     63 inches Weight:      214.2 pounds Temp:     98.6 degrees F Pulse rate:   61 / minute BP sitting:   122 / 65  (left arm)  Pt. in pain?   yes    Location:   left knee    Intensity:   10    Type:       sharp and aching  Vitals Entered ByArnette Schaumann RN (December 28, 2007 9:15 AM)                  PCP:  [name]  MD  Chief Complaint:  f/u BP and and left knee pain.  History of Present Illness: 1. BP Taking amlodipine without difficutly. BP controlled today. Pt not having side effects.   2.  Left knee pain Started yesterday after going to the fair with granddaughter. Ate some cheese which she think might have brought on a gout flair. Has taken advil with no relief. Still taking allopurinol. States that her knee is warm and tender to touch. Swollen yesterday but less so today. Difficulty with movement at the joint and transfers.  3. Preventive care Asked patient to have results of pap faxed to our office. Last colonoscopy in 2006 to look for diverticulitis was normal so pt is now up to date.  4. diabetes Pt states that her sugars have been mostly in the 110s to 120s in the morning but this am was 150. Last A1C was 7.1. Pt states that she taking her medications without difficulty. Could not tolerate metformin in the past due to diarrhea. Does daily foot exams. Eye exam this year was normal.       Risk Factors:     Counseled to quit/cut down tobacco use:  yes  Mammogram History:     Date of Last Mammogram:  12/22/2007   Mammogram History:     Date of Last Mammogram:  11/30/2007   Mammogram History:     Date of Last Mammogram:  11/30/2007    Results:  normal   Colonoscopy History:     Date of Last Colonoscopy:  04/19/2004   Colonoscopy History:     Date of Last Colonoscopy:  04/19/2004    Results:  normal   PAP Smear History:     Date of Last PAP Smear:  07/08/1998    Colonoscopy Result Date:  04/19/2004 Colonoscopy Result:  normal Colonoscopy Next Due:  10 yr Last Hemoccult Result: Done. (09/07/2003 12:00:00 AM) Hemoccult Next Due:  Not Indicated Last Mammogram:  Normal (12/22/2007 10:35:41 AM) Mammogram Result Date:  11/30/2007 Mammogram Result:  normal Mammogram Next Due:  1 yr Diabetic Foot Exam Date:  12/28/2007 Diabetes Foot Check Result:  normal Diabetes Foot Check Next Due:  1 yr Diabetic Eye Exam Date:  11/16/2007 Diabetes Eye Exam Result:  normal Diabetes Eye Exam Due:  1 yr    Physical Exam  General:     alert and overweight-appearing.  Looks in pain with transfers Lungs:     Normal respiratory effort, chest expands symmetrically. Lungs are clear to auscultation, no crackles or wheezes. Heart:     normal rate, regular rhythm, and no murmur.   Abdomen:     soft and non-tender.   Msk:     R knee normal to inspection, palpation; intact ROM  L knee mildly swollen to inspection;  palpation reveals warmth over prepatellar region; mild effesuion around prepatellar bursa; ROM not extensively tested due to pt comfort. Mild tenderderness over joint bilaterally. No effusion or ballotable areas superiorly.  Skin:     Intact without suspicious lesions or rashes    Impression & Recommendations:  Problem # 1:  GOUT NOS (ICD-274.9) Assessment: Deteriorated Possibly represents gout flare or inflammation from exacerbation of OA. Will rx colchicine for 1 week with instructions to return if worse or not better. Advised ice as well.  Her updated medication list for this problem includes:    Allopurinol 100 Mg Tabs (Allopurinol) .[name]... 1 tablet by mouth once a day    Colchicine 0.6 Mg Tabs (Colchicine) ..... One by mouth by mouth daily  Orders: [place]- Est  Level 4 (47425)   Problem # 2:  HYPERTENSION, BENIGN SYSTEMIC (ICD-401.1) Assessment: Improved Amlodipine working well. Pt tolerating. At goal. continue to follow. Her updated  medication list for this problem includes:    Hydrochlorothiazide 25 Mg Tabs (Hydrochlorothiazide) .[name]... Take 1 tablet by mouth once a day    Atenolol 50 Mg Tabs (Atenolol) ..... One by mouth daily    Enalapril Maleate 20 Mg Tabs (Enalapril maleate) ..... One tablet by mouth twice a day    Amlodipine Besylate 10 Mg Tabs (Amlodipine besylate) ..... 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 Tol meds. Continue to follow. Try to get A1C in 3-4 months. Last was 7.1. Her updated medication list for this problem includes:    Actos 15 Mg Tabs (Pioglitazone hcl) .[name]... Take 1 tablet by mouth once a day    Bayer Childrens Aspirin 81 Mg Chew (Aspirin) .[name]... Take 1 tablet by mouth once a day    Glucotrol Xl 10 Mg Tb24 (Glipizide) .[name]... Take 2 tablet by mouth once a day    Enalapril Maleate 20 Mg Tabs (Enalapril maleate) ..... One tablet by mouth twice a day  Orders: [place]- Est  Level 4 (51884)   Complete Medication List: 1)  Actos 15 Mg Tabs (Pioglitazone hcl) .... Take 1 tablet by mouth once a day 2)  Allopurinol 100 Mg Tabs (Allopurinol) .[name].. 1 tablet by mouth once a day 3)  Bayer Childrens Aspirin 81 Mg Chew (Aspirin) .... Take 1 tablet by mouth once a day 4)  Claritin 10 Mg Tabs (Loratadine) .... Take 1 tablet by mouth once a day 5)  Glucotrol Xl 10 Mg Tb24 (Glipizide) .... Take 2 tablet by mouth once a day 6)  Hydrochlorothiazide 25 Mg Tabs (Hydrochlorothiazide) .... Take 1 tablet by mouth once a day 7)  Trazamine 50 Mg Misc (Trazodone & diet manage prod) .[name].. 1 tab by mouth at bedtime prn 8)  Hyomax-sl 0.125 Mg Subl (Hyoscyamine sulfate) .... 2 tabs by mouth once daily 9)  Flonase 50 Mcg/act Susp (Fluticasone propionate) .[name].. 1 spray each nostril daily 10)  Sertraline Hcl 100 Mg Tabs (Sertraline hcl) .... Two tabs by mouth daily 11)  Atenolol 50 Mg Tabs (Atenolol) .... One  by mouth daily 12)  Enalapril Maleate 20 Mg Tabs (Enalapril maleate) .... One tablet by mouth twice a day 13)  Tussionex Pennkinetic Er 8-10 Mg/66m Lqcr (Chlorpheniramine-hydrocodone) .... One teaspoon every 12 hours as needed for cough. dispense 60 ml or standard amount 14)  Flexeril 5 Mg Tabs (Cyclobenzaprine hcl) .... Take one by mouth at bedtime as needed for back pain 15)  Amlodipine Besylate 10 Mg Tabs (Amlodipine besylate) .... One by mouth daily 16)  Meclizine Hcl 25 Mg Tabs (Meclizine hcl) .... One by mouth 2-3 times a day as needed for vertigo 17)  Colchicine 0.6 Mg Tabs (Colchicine) .... One by mouth by mouth daily 18)  Ultram 50 Mg Tabs (Tramadol hcl) .... One by mouth every 6 hours  Other Orders: Influenza Vaccine MCR (93716)   Patient Instructions: 1)  Come back in 3-4 days if your knee is worse or earlier for other concerns.  2)  Continue to watch your diet and keep your sugars under control.  3)  Check your feet daily. 4)  Good job on your blood pressure. 5)  Schedule a follow-up appointment 3-4 months.    Prescriptions: ULTRAM 50 MG TABS (TRAMADOL HCL) one by mouth every 6 hours  #30 x 0   Entered and Authorized by:   [name]  MD   Signed by:   [name]  MD on 12/28/2007   Method used:   Print then Give to Patient   RxID:   [id number] COLCHICINE 0.6 MG TABS (COLCHICINE) one by mouth by mouth daily  #7 x 0   Entered and Authorized by:   [name]  MD   Signed by:   [name]  MD on 12/28/2007   Method used:   Print then Give to Patient   RxID:   [id number]  [email or web address]  Influenza Vaccine    Vaccine Type: Fluvax MCR    Site: right deltoid    Mfr: GlaxoSmithKline    Dose: 0.5 ml    Route: IM    Given by: AMY MARTIN RN    Exp. Date: 10/05/2008    Lot #: [id number]    VIS given: 10/30/06 version given December 28, 2007.  Flu Vaccine Consent Questions    Do you have a history of severe allergic reactions to this vaccine? no    Any  prior history of allergic reactions to egg and/or gelatin? no    Do you have a sensitivity to the preservative Thimersol? no    Do you have a past history of Guillan-Barre Syndrome? no    Do you currently have an acute febrile illness? no    Have you ever had a severe reaction to latex? no    Vaccine information given and explained to patient? yes    Are you currently pregnant? no  Appended Document: F/U BP    Clinical Lists Changes  Observations: Added new observation of FLUVAXDUE: 12/27/2008 (01/19/2008 9:23) Added new observation of TDBOOSTDUE: 06/07/2014 (01/19/2008 9:23) Added new observation of PNEUMVXNEXT: None (01/19/2008 9:23) Added new observation of HDLNXTDUE: 53/61/4431 (01/19/2008 9:23) Added new observation of LDLNXTDUE: 54/00/8676 (01/19/2008 9:23) Added new observation of FLEXSIGDUE: Not Indicated (01/19/2008 9:23) Added new observation of COLONNXTDUE: 04/19/2014 (01/19/2008 9:23) Added new observation of HEMOCULTDUE: Not Indicated (01/19/2008 9:23) Added new observation of PAP DUE: 06/23/2010 (01/19/2008 9:23) Added new observation of MAMMO DUE: 12/21/2008 (01/19/2008 9:23) Added new observation of DMEYEEXAMNXT: 11/15/2008 (01/19/2008 9:23) Added new observation of HGBA1CNXTDUE: 01/20/2008 (01/19/2008 9:23) Added new observation of MICRALB DUE: 11/26/2008 (01/19/2008 9:23) Added new observation of CREATNXTDUE: 05/28/2008 (01/19/2008 9:23) Added new observation of POTASSIUMDUE: 05/28/2008 (01/19/2008 9:23) Added new observation of LAST PAP DAT: 06/23/2007 (06/23/2007 9:24) Added new observation of PAP SMEAR: normal (06/23/2007 9:24)      Last Flex Sig:  Done. (04/08/2004 12:00:00 AM) Flex Sig Next Due:  Not Indicated Last Hemoccult Result: Done. (09/07/2003 12:00:00 AM) Hemoccult  Next Due:  Not Indicated Last PAP:  Done. (07/08/1998 12:00:00 AM) PAP Result Date:  06/23/2007 PAP Result:  normal PAP Next Due:  3 yr

## 2010-05-08 NOTE — Progress Notes (Signed)
Summary: cpap  Phone Note Call from Patient Call back at Home Phone [phone]   Caller: Patient Call For: clance Reason for Call: Talk to Nurse Summary of Call: pt still has not gotten her cpap machine.  [facility] called her and she called them back.  Left message and they haven't called her back.  Appt was on the 20th.  Can you please check on this?   Initial call taken by: [name],  May 09, 2009 10:03 AM  Follow-up for Phone Call        Will forward message to [facility] to address. Matthew Folks LPN  May 09, 3460 10:06 AM  Called [name] at [facility]. She is out of office doing a deliverly but will return my call when she get in. [name]  May 09, 2009 10:47 AM Spoke with [name] and she stated that she would contact [facility] and call pt today. Pt has been advised of this. [name]  May 09, 2009 3:31 PM Pt also advised that if she doesn't hear from them today to let me know [name]  May 09, 2009 3:33 PM

## 2010-05-08 NOTE — Assessment & Plan Note (Signed)
Summary: DM, HTN   Vital Signs:  Patient profile:   65 year old female Height:      62 inches Weight:      219.31 pounds BMI:     40.26 BSA:     1.99 Temp:     98.5 degrees F Pulse rate:   63 / minute BP sitting:   130 / 78  Vitals Entered By: Christen Bame CMA (February 14, 2010 10:34 AM) CC: f/u Is Patient Diabetic? Yes Did you bring your meter with you today? No Pain Assessment Patient in pain? no        Primary Care Provider:  [name]  MD  CC:  f/u.  History of Present Illness: BP today:130/78 Taking Meds:yes Side Effects:pt has a cough that she contributes to her atenolol ROS: denies Headache visual changes nausea vomiting abdominal pain numbness in extremities    glucose low:109 Glucose high:186 Last A1C:see flow sheet Taking Meds:yes but wants to stop actos due to pt hearing it is linked to bladder cancer.  On INsulin:no  Side effects:no ROS: Denies polyuria, polydipsia, visual changes numbness in extremities, foot ulcers Lifestyle modifications:pt is not doing any regular activity.   Preventitive Pt would like flu and pneumovax today.     Habits & Providers  Alcohol-Tobacco-Diet     Tobacco Status: current     Tobacco Counseling: to quit use of tobacco products     Cigarette Packs/Day: <0.25     Year Started: 1972     Pack years: 19.50     Passive Smoke Exposure: yes  Current Medications (verified): 1)  Allopurinol 300 Mg Tabs (Allopurinol) .... One By Mouth Daily To Prevent Gout 2)  Bayer Childrens Aspirin 81 Mg Chew (Aspirin) .... Take 1 Tablet By Mouth Once A Day 3)  Claritin 10 Mg Tabs (Loratadine) .... Take 1 Tablet By Mouth Once A Day 4)  Glucotrol Xl 10 Mg Tb24 (Glipizide) .... Take 2 Tablet By Mouth Once A Day 5)  Hydrochlorothiazide 25 Mg Tabs (Hydrochlorothiazide) .... Take 1 Tablet By Mouth Once A Day 6)  Dicyclomine Hcl 20 Mg Tabs (Dicyclomine Hcl) .... One By Mouth Four Times Daily 7)  Flonase 50 Mcg/act  Susp (Fluticasone  Propionate) .[name].. 1 Spray Each Nostril Daily 8)  Sertraline Hcl 100 Mg  Tabs (Sertraline Hcl) .[name].. 1 Tab  By Mouth Once Daily 9)  Atenolol 50 Mg  Tabs (Atenolol) .... One By Mouth Daily 10)  Enalapril Maleate 20 Mg  Tabs (Enalapril Maleate) .... One Tablet By Mouth Twice A Day 11)  Amlodipine Besylate 10 Mg  Tabs (Amlodipine Besylate) .... One By Mouth Daily 12)  Nitrostat 0.4 Mg Subl (Nitroglycerin) .... One By Mouth Sublingually As Needed For Chest Pain 13)  Lipitor 10 Mg Tabs (Atorvastatin Calcium) .... One By Mouth Daily For Cholesterol  Allergies (verified): No Known Drug Allergies  Past History:  Past medical, surgical, family and social histories (including risk factors) reviewed, and no changes noted (except as noted below).  Past Medical History: Reviewed history from 08/02/2009 and no changes required. Current Problems:  HYPERTENSION, BENIGN SYSTEMIC (ICD-401.1) CHEST PAIN (ICD-786.50) SLEEP APNEA (ICD-780.57) HYPERTENSION (ICD-401.9) DYSLIPIDEMIA (ICD-272.4) OBESITY, UNSPECIFIED (ICD-278.00) OBSTRUCTIVE SLEEP APNEA (ICD-327.23) - on CPAP per Dr. Gwenette Greet RESTRICTIVE LUNG DISEASE (ICD-518.89) DYSPNEA/SHORTNESS OF BREATH (ICD-786.09) HIP PAIN, BILATERAL (ICD-719.45) BENIGN POSITIONAL VERTIGO (ICD-386.11) SCIATICA, RIGHT (ICD-724.3) VACCINE AGAINST INFLUENZA (ICD-V04.81) GOUT NOS (ICD-274.9) SEBORRHEIC KERATOSIS (ICD-702.19) TOBACCO DEPENDENCE (ICD-305.1) RHINITIS, ALLERGIC (ICD-477.9) OSTEOARTHRITIS, MULTI SITES (ICD-715.98) MENOPAUSAL SYNDROME (ICD-627.2) IRRITABLE  BOWEL SYNDROME (ICD-564.1) DIABETES MELLITUS II, UNCOMPLICATED (NWG-956.21) DEPRESSION, MAJOR, RECURRENT (ICD-296.30) ANXIETY (ICD-300.00) mild aortic regurg, mild MR.         Past Surgical History: Reviewed history from 04/27/2009 and no changes required. Hysterectomy--partial -, L rotator cuff repair 11/07-[facility] - 03/14/2006,  R shoulder surgery w/ Dr. Jenean Lindau -,  tonsillectomy - B feet  surgery  echo 10/10: mild pulm HTN, EF 60-65%, mild LVH, moderate aortic regurg  Family History: Reviewed history from 04/27/2009 and no changes required. heart disease: mother (M.I.) rheumatism: mother cancer: mother (breast), sister (pancreatic)  Mother: HTN, DM<  sister: Dm  Social History: Reviewed history from 04/27/2009 and no changes required. Divorced '97 and now single.   Lives alone,  pt has 4 children. smokes 1/2 ppd.  started at age 20. No etoh.  Currently disabled.  Previously worked as a [employer]arry manager.  Has trouble affording medications. Recently moved to 1st floor apartment housing (end of June 2010). Likes it there.  Grandson recently sent to jail - (38 - 42 months) -- this is stressful for the patient.  Review of Systems       denies fever, chills, nausea, vomiting, diarrhea or constipation   Physical Exam  General:  Well-developed,well-nourished,in no acute distress; alert,appropriate and cooperative throughout examination.  Vitals reviewed. Eyes:  pupils equal, pupils round, pupils reactive to light, and pupils react to accomodation.   Mouth:  Oral mucosa and oropharynx without lesions or exudates.   Lungs:  Normal respiratory effort, chest expands symmetrically. Lungs are clear to auscultation, no crackles or wheezes. Heart:  Normal rate and regular rhythm. S1 and S2 normal without gallop, murmur, click, rub or other extra sounds. Abdomen:  soft, non-tender, normal bowel sounds, and no distention.   Msk:  normal ROM.   Pulses:  R and L carotid,dorsalis pedis and posterior are full and equal bilaterally Extremities:  no edema Skin:  no suspicious lesions.    Impression & Recommendations:  Problem # 1:  HYPERTENSION, BENIGN SYSTEMIC (ICD-401.1) At goal will make no changes at this time, if cough continues would consider changing to ARB from ACEi being more likely cause then atenolol. Will see again in 3-6 months.  Her updated medication list for this problem  includes:    Hydrochlorothiazide 25 Mg Tabs (Hydrochlorothiazide) .[name]... Take 1 tablet by mouth once a day    Atenolol 50 Mg Tabs (Atenolol) ..... One by mouth daily    Enalapril Maleate 20 Mg Tabs (Enalapril maleate) ..... One tablet by mouth twice a day    Amlodipine Besylate 10 Mg Tabs (Amlodipine besylate) ..... One by mouth daily  Orders: [facility]- Est  Level 4 (30865) Comp Met-[facility] (78469-62952) Direct LDL-[facility] (84132-44010)  Problem # 2:   DIABETES MELLITUS II, UNCOMPLICATED (UVO-536.64) Last A1c was good at 6.6 today, at goal, will allow pt to come off ACTOS but will need to increase activity.  Pt does not want to do metformin due to GI discomfort previously.  Told pt though will need another medication if next A1C worsens.  The following medications were removed from the medication list:    Actos 30 Mg Tabs (Pioglitazone hcl) ..... One by mouth daily Her updated medication list for this problem includes:    Bayer Childrens Aspirin 81 Mg Chew (Aspirin) .[name]... Take 1 tablet by mouth once a day    Glucotrol Xl 10 Mg Tb24 (Glipizide) .[name]... Take 2 tablet by mouth once a day    Enalapril Maleate 20 Mg Tabs (Enalapril maleate) .Marland KitchenMarland KitchenMarland KitchenMarland Kitchen  One tablet by mouth twice a day  Orders: Direct LDL-[facility] (78676-72094) A1C-[facility] (70962)  Problem # 3:  preventitive care flu and pneumovax today  Complete Medication List: 1)  Allopurinol 300 Mg Tabs (Allopurinol) .... One by mouth daily to prevent gout 2)  Bayer Childrens Aspirin 81 Mg Chew (Aspirin) .... Take 1 tablet by mouth once a day 3)  Claritin 10 Mg Tabs (Loratadine) .... Take 1 tablet by mouth once a day 4)  Glucotrol Xl 10 Mg Tb24 (Glipizide) .... Take 2 tablet by mouth once a day 5)  Hydrochlorothiazide 25 Mg Tabs (Hydrochlorothiazide) .... Take 1 tablet by mouth once a day 6)  Dicyclomine Hcl 20 Mg Tabs (Dicyclomine hcl) .... One by mouth four times daily 7)  Flonase 50 Mcg/act Susp (Fluticasone propionate) .[name].. 1 spray each nostril daily 8)   Sertraline Hcl 100 Mg Tabs (Sertraline hcl) .[name].. 1 tab  by mouth once daily 9)  Atenolol 50 Mg Tabs (Atenolol) .... One by mouth daily 10)  Enalapril Maleate 20 Mg Tabs (Enalapril maleate) .... One tablet by mouth twice a day 11)  Amlodipine Besylate 10 Mg Tabs (Amlodipine besylate) .... One by mouth daily 12)  Nitrostat 0.4 Mg Subl (Nitroglycerin) .... One by mouth sublingually as needed for chest pain 13)  Lipitor 10 Mg Tabs (Atorvastatin calcium) .... One by mouth daily for cholesterol  Other Orders: Flu Vaccine 70yr + ((83662 Admin 1st Vaccine ((94765 Pneumococcal Vaccine ((46503 Admin of Any Addtl Vaccine ((54656  Patient Instructions: 1)  Nice to meet you 2)  You can stop the actos and we will see how your blood sugars do.  if your go above 200 I would like you to take the actos again or call me and we will start metformin.  3)  Your blood pressure is great. 4)  Your due for another colonoscopy in 2016.  5)  I need to see you again in 2 months to see how your sugars are doing with you off the actos.    Orders Added: 1)  FEmpire Est  Level 4 [99214] 2)  Comp Met-[facility] [[81275-17001]3)  Direct LDL-[facility] [[74944-96759]4)  A1C-[facility] [83036] 5)  Flu Vaccine 330yr+ [90658] 6)  Admin 1st Vaccine [90471] 7)  Pneumococcal Vaccine [90732] 8)  Admin of Any Addtl Vaccine [9[16384] Immunizations Administered:  Influenza Vaccine # 1:    Vaccine Type: Fluvax 3+    Site: right deltoid    Mfr: GlaxoSmithKline    Dose: 0.5 ml    Route: IM    Given by: AsLevert FeinsteinPN    Exp. Date: 10/03/2010    Lot #: [id number]  VIS given: 10/31/09 version given February 14, 2010.  Pneumonia Vaccine:    Vaccine Type: Pneumovax    Site: left deltoid    Mfr: Merck    Dose: 0.5 ml    Route: IM    Given by: AsLevert FeinsteinPN    Exp. Date: 07/31/2011    Lot #: [id number]  VIS given: 03/13/09 version given February 14, 2010.  Flu Vaccine Consent Questions:    Do you have a history of severe allergic  reactions to this vaccine? no    Any prior history of allergic reactions to egg and/or gelatin? no    Do you have a sensitivity to the preservative Thimersol? no    Do you have a past history of Guillan-Barre Syndrome? no    Do you currently have an  acute febrile illness? no    Have you ever had a severe reaction to latex? no    Vaccine information given and explained to patient? yes    Are you currently pregnant? no   Immunizations Administered:  Influenza Vaccine # 1:    Vaccine Type: Fluvax 3+    Site: right deltoid    Mfr: GlaxoSmithKline    Dose: 0.5 ml    Route: IM    Given by: Levert Feinstein LPN    Exp. Date: 10/03/2010    Lot #: [id number]    VIS given: 10/31/09 version given February 14, 2010.  Pneumonia Vaccine:    Vaccine Type: Pneumovax    Site: left deltoid    Mfr: Merck    Dose: 0.5 ml    Route: IM    Given by: Levert Feinstein LPN    Exp. Date: 07/31/2011    Lot #: [id number]    VIS given: 03/13/09 version given February 14, 2010.  Prevention & Chronic Care Immunizations   Influenza vaccine: Fluvax 3+  (02/14/2010)   Influenza vaccine due: 12/27/2008    Tetanus booster: 06/06/2004: Done.   Tetanus booster due: 06/07/2014    Pneumococcal vaccine: Pneumovax  (02/14/2010)   Pneumococcal vaccine due: None    H. zoster vaccine: 03/08/2008: .  Colorectal Screening   Hemoccult: Done.  (09/07/2003)   Hemoccult due: Not Indicated    Colonoscopy: normal  (04/19/2004)   Colonoscopy due: 04/19/2014  Other Screening   Pap smear: normal  (06/23/2007)   Pap smear action/deferral: Deferred  (02/14/2010)   Pap smear due: 06/23/2010    Mammogram: ASSESSMENT: Negative - BI-RADS 1^MM DIGITAL SCREENING  (01/01/2010)   Mammogram due: 12/21/2008    DXA bone density scan: Not documented   Smoking status: current  (02/14/2010)   Smoking cessation counseling: yes  (12/28/2007)   Target quit date: 05/23/2009  (05/02/2009)  Diabetes Mellitus   HgbA1C: 6.6  (02/14/2010)    Hemoglobin A1C due: 01/20/2008    Eye exam: normal  (11/14/2008)   Diabetic eye exam action/deferral: Not indicated  (02/14/2010)   Eye exam due: 11/15/2008    Foot exam: yes  (01/04/2009)   Foot exam action/deferral: Do today   High risk foot: Not documented   Foot care education: Not documented   Foot exam due: 12/27/2008    Urine microalbumin/creatinine ratio: Not documented   Urine microalbumin/cr due: 11/26/2008    Diabetes flowsheet reviewed?: Yes   Progress toward A1C goal: Unchanged  Lipids   Total Cholesterol: 160  (03/13/2009)   LDL: See Comment mg/dL  (10/20/2007)   LDL Direct: 63  (09/15/2009)   HDL: 26.90  (03/13/2009)   Triglycerides: 359.0  (03/13/2009)    SGOT (AST): 13  (09/15/2009)   SGPT (ALT): 14  (09/15/2009) CMP ordered    Alkaline phosphatase: 110  (09/15/2009)   Total bilirubin: 0.4  (09/15/2009)    Lipid flowsheet reviewed?: Yes   Progress toward LDL goal: At goal    Stage of readiness to change (lipid management): Maintenance  Hypertension   Last Blood Pressure: 130 / 78  (02/14/2010)   Serum creatinine: 0.82  (09/15/2009)   Serum potassium 4.3  (09/15/2009) CMP ordered     Hypertension flowsheet reviewed?: Yes   Progress toward BP goal: At goal    Stage of readiness to change (hypertension management): Maintenance  Self-Management Support :   Personal Goals (by the next clinic visit) :     Personal A1C goal: 7  (  01/04/2009)     Personal blood pressure goal: 130/80  (01/04/2009)     Personal LDL goal: 70  (01/04/2009)    Diabetes self-management support: Written self-care plan  (06/12/2009)    Diabetes self-management support not done because: Good outcomes  (04/26/2009)    Hypertension self-management support: Written self-care plan  (06/12/2009)    Hypertension self-management support not done because: Good outcomes  (06/12/2009)    Lipid self-management support: Written self-care plan  (06/12/2009)     Lipid self-management  support not done because: Good outcomes  (06/12/2009)   Nursing Instructions: Give Flu vaccine today    Laboratory Results   Blood Tests   Date/Time Received: February 14, 2010 10:40 AM  Date/Time Reported: February 14, 2010 11:13 AM   HGBA1C: 6.6%   (Normal Range: Non-Diabetic - 3-6%   Control Diabetic - 6-8%)  Comments: ...........test performed by...........Marland KitchenHedy Camara, CMA

## 2010-05-08 NOTE — Letter (Signed)
Summary: Generic Letter  [facility]  [address]   [place], [place] [address]   Phone: [phone]  Fax: [phone]    09/18/2009  [facility] [address] [place], [place]  [address]  Dear Ms. Streck,  I wanted to update you on your labs from the other day. You LDL cholesterol (the "bad" cholesterol) is excellent at 63. Your electrolytes and kidney tests also look good. Your uric acid level is still high at 8.1. A level this high can put you at risk for a gout flare but I understand that this has not been a problem for you in a while. Please call back to discuss your allopurinol dose further. Also please let us know if you've missed any doses of this medication. Nice to see you the other day.  Sincerely,   Myrtie Soman  MD  Appended Document: Generic Letter mailed.

## 2010-05-08 NOTE — Assessment & Plan Note (Signed)
Summary: Bonnie Peterson   Vital Signs:  Patient profile:   65 year old female Height:      63 inches Weight:      216.9 pounds BMI:     38.56 Temp:     97.5 degrees F oral Pulse rate:   64 / minute BP sitting:   138 / 81  (right arm) Cuff size:   large  Vitals Entered By: Garen Grams LPN (January 31, 2009 9:20 AM)  CC: Bonnie Peterson for chest pain Is Patient Diabetic? Yes  Pain Assessment Patient in pain? yes     Location: knees   Primary Care Provider:  Myrtie Soman  MD  CC:  Bonnie Peterson for chest pain.  History of Present Illness: 1. chest pain This has completely resolved. Admitted 10/7 - 10/8 for chest pain. Ruled out by EKG and serial enzymes. Echo done on admit showed normal EF and no wall motion abnormality. Mild LVH, pulm HTN and mod aortic regurgitation. Risk factors include diabetes (A1c 7.0 8/10), smoking, HTN, HLD. Last LDL 88 8/10; Last HDL 30 7/09.   Pt has not used any nitrogylcerin.   2. Smoking Has cut down to 1-2 cigs per day -- says the chest pain scared her. Is thinking about starting the patch.  3. diabetes last a1c 7.0 8/10. No problems with actos, glipizide. Did not tolerate metformin in the past due to stomach problems. Would consider restarted metformin again. Pt is obese, weight has been stable at around 216. Pt does not exercise.   Habits & Providers  Alcohol-Tobacco-Diet     Tobacco Status: current     Cigarette Packs/Day: <0.25  Current Medications (verified): 1)  Actos 30 Mg Tabs (Pioglitazone Hcl) .... One By Mouth Daily 2)  Allopurinol 200 Mg Tab .... Take 1 Tab By Mouth Daily For Gout 3)  Bayer Childrens Aspirin 81 Mg Chew (Aspirin) .... Take 1 Tablet By Mouth Once A Day 4)  Claritin 10 Mg Tabs (Loratadine) .... Take 1 Tablet By Mouth Once A Day 5)  Glucotrol Xl 10 Mg Tb24 (Glipizide) .... Take 2 Tablet By Mouth Once A Day 6)  Hydrochlorothiazide 25 Mg Tabs (Hydrochlorothiazide) .... Take 1 Tablet By Mouth Once A Day 7)  Trazamine 50 Mg  Misc (Trazodone &  Diet Manage Prod) .[name].. 1 Tab By Mouth At Bedtime Prn 8)  Dicyclomine Hcl 20 Mg Tabs (Dicyclomine Hcl) .... One By Mouth Four Times Daily 9)  Flonase 50 Mcg/act  Susp (Fluticasone Propionate) .[name].. 1 Spray Each Nostril Daily 10)  Sertraline Hcl 100 Mg  Tabs (Sertraline Hcl) .... Two Tabs By Mouth Daily 11)  Atenolol 50 Mg  Tabs (Atenolol) .... One By Mouth Daily 12)  Enalapril Maleate 20 Mg  Tabs (Enalapril Maleate) .... One Tablet By Mouth Twice A Day 13)  Tussionex Pennkinetic Er 8-10 Mg/27ml  Lqcr (Chlorpheniramine-Hydrocodone) .... One Teaspoon Every 12 Hours As Needed For Cough. Dispense 60 Ml or Standard Amount 14)  Flexeril 5 Mg  Tabs (Cyclobenzaprine Hcl) .... Take One By Mouth At Bedtime As Needed For Back Pain 15)  Amlodipine Besylate 10 Mg  Tabs (Amlodipine Besylate) .... One By Mouth Daily 16)  Ultram 50 Mg Tabs (Tramadol Hcl) .... One By Mouth Every 6 Hours 17)  Ventolin Hfa 108 (90 Base) Mcg/act Aers (Albuterol Sulfate) .... 2 Puffs Every Four Hours As Needed 18)  Nitrostat 0.4 Mg Subl (Nitroglycerin) .... One By Mouth Sublingually As Needed For Chest Pain  Allergies (verified): No Known Drug Allergies  Past  History:  Family History: Last updated: 01/12/2009 Mom w/ DM, HTN.  Mom had MI in 2000.   Sister w/ DM.  No breast Ca.  Social History: Last updated: 11/11/2008 Divorced '97.  Lives alone, smokes.  No etoh.  Applying for disabilty through [facility] (ortho). Has trouble affording medications. Recently moved to 1st floor apartment housing (end of June 2010). Likes it there.   Past Medical History: Gout  89 DM type II HTN Osteoarthritis IBS mod aortic regurg mild pulm HTN mild LVH  Past Surgical History: Hysterectomy--partial -, L rotator cuff repair 11/07-[facility] - 03/14/2006,  shoulder surgery w/ Dr. Criss Alvine -,  tonsillectomy -  echo 10/10: mild pulm HTN, EF 60-65%, mild LVH, moderate aortic regurg  Social History: Packs/Day:  <0.25  Physical Exam  General:   General:  Vital signs reviewed -- obese b Alert, appropriate; well-dressed and well-nourished Lungs:  work of breathing unlabored, clear to auscultation bilaterally; no wheezes, rales, or ronchi; good air movement throughout Heart:  regular rate and rhythm, no murmurs; normal s1/s2 Pulses:  DP and radial pulses 2+ bilaterally  Extremities:  no cyanosis, clubbing, or edema Neurologic:  alert and oriented. speech normal.    Impression & Recommendations:  Problem # 1:  CHEST PAIN (ICD-786.50) Assessment Improved Negative w/u in hospital. Given risk factors I think pt is at intermediate risk and merits further stratification with stress testing. Will therefore refer to cardiology. Pt with poor exericise capacity, OA, and restrictive lung disease so will defer to cards re: treadmill vs. chemical stress.   Orders: Cardiology Referral (Cardiology) [facility]- Est  Level 4 (28413)  Problem # 2:  TOBACCO DEPENDENCE (ICD-305.1) Assessment: Improved  congratulated pt on reduction. Encouraged complete cessation.   Orders: [facility]- Est  Level 4 (24401)  Problem # 3:  DIABETES MELLITUS II, UNCOMPLICATED (ICD-250.00) Assessment: Unchanged  Pt open to trying metformin again "after the first of the year." Continue to consider .Recheck a1c next appointment.  Her updated medication list for this problem includes:    Actos 30 Mg Tabs (Pioglitazone hcl) ..... One by mouth daily    Bayer Childrens Aspirin 81 Mg Chew (Aspirin) .[name]... Take 1 tablet by mouth once a day    Glucotrol Xl 10 Mg Tb24 (Glipizide) .[name]... Take 2 tablet by mouth once a day    Enalapril Maleate 20 Mg Tabs (Enalapril maleate) ..... One tablet by mouth twice a day  Orders: [facility]- Est  Level 4 (02725)  Complete Medication List: 1)  Actos 30 Mg Tabs (Pioglitazone hcl) .... One by mouth daily 2)  Allopurinol 200 Mg Tab  .... Take 1 tab by mouth daily for gout 3)  Bayer Childrens Aspirin 81 Mg Chew (Aspirin) .... Take 1 tablet by mouth once a  day 4)  Claritin 10 Mg Tabs (Loratadine) .... Take 1 tablet by mouth once a day 5)  Glucotrol Xl 10 Mg Tb24 (Glipizide) .... Take 2 tablet by mouth once a day 6)  Hydrochlorothiazide 25 Mg Tabs (Hydrochlorothiazide) .... Take 1 tablet by mouth once a day 7)  Trazamine 50 Mg Misc (Trazodone & diet manage prod) .[name].. 1 tab by mouth at bedtime prn 8)  Dicyclomine Hcl 20 Mg Tabs (Dicyclomine hcl) .... One by mouth four times daily 9)  Flonase 50 Mcg/act Susp (Fluticasone propionate) .[name].. 1 spray each nostril daily 10)  Sertraline Hcl 100 Mg Tabs (Sertraline hcl) .... Two tabs by mouth daily 11)  Atenolol 50 Mg Tabs (Atenolol) .... One by mouth daily 12)  Enalapril Maleate 20 Mg Tabs (Enalapril maleate) .... One tablet by mouth twice a day 13)  Tussionex Pennkinetic Er 8-10 Mg/58ml Lqcr (Chlorpheniramine-hydrocodone) .... One teaspoon every 12 hours as needed for cough. dispense 60 ml or standard amount 14)  Flexeril 5 Mg Tabs (Cyclobenzaprine hcl) .... Take one by mouth at bedtime as needed for back pain 15)  Amlodipine Besylate 10 Mg Tabs (Amlodipine besylate) .... One by mouth daily 16)  Ultram 50 Mg Tabs (Tramadol hcl) .... One by mouth every 6 hours 17)  Ventolin Hfa 108 (90 Base) Mcg/act Aers (Albuterol sulfate) .... 2 puffs every four hours as needed 18)  Nitrostat 0.4 Mg Subl (Nitroglycerin) .... One by mouth sublingually as needed for chest pain  Other Orders: Flu Vaccine 42yrs + (04540) Admin 1st Vaccine (98119) Admin 1st Vaccine [facility]) [phone])  Patient Instructions: 1)  We'll send you to cardiology to see about getting a stress test. 2)  follow-up with me in early december. We'll see about getting you a pneumonia shot at that time. 3)  Seek medical attention for any chest pain that is worrisome, does not get better, or for any other concerns. 4)  Great to see you today. 5)  We may need to think about starting metformin in the next few months.    Influenza Vaccine    Vaccine Type:  Fluvax 3+    Site: right deltoid    Mfr: GlaxoSmithKline    Dose: 0.5 ml    Route: IM    Given by: Garen Grams LPN    Exp. Date: 10/05/2009    Lot #: [id number]    VIS given: 10/30/06 version given January 31, 2009.  Flu Vaccine Consent Questions    Do you have a history of severe allergic reactions to this vaccine? no    Any prior history of allergic reactions to egg and/or gelatin? no    Do you have a sensitivity to the preservative Thimersol? no    Do you have a past history of Guillan-Barre Syndrome? no    Do you currently have an acute febrile illness? no    Have you ever had a severe reaction to latex? no    Vaccine information given and explained to patient? yes    Are you currently pregnant? no

## 2010-05-08 NOTE — Assessment & Plan Note (Signed)
Summary: leg swollenwp   Vital Signs:  Patient Profile:   65 Years Old Female Height:     63 inches Weight:      214.5 pounds Temp:     98.4 degrees F Pulse rate:   64 / minute BP sitting:   127 / 73  (left arm)  Vitals Entered By: Theresia Lo RN (Sep 03, 2007 3:03 PM)             Is Patient Diabetic? No     PCP:  Myrtie Soman  MD  Chief Complaint:  pain in left leg from hip down and feels like a  "catch  "in right calf.  History of Present Illness: 1. Pt comes in today with c/o of right leg pain radiating from her hip to below her knee. States that this makes pivoting and walking difficult. Has taken advil with some relief. Wonders if this could be related to gout. No redness or swelling. Pt does state that leg seems to feel a little bit warm. Describes pain as a "sharp, shooting" pain.  2. diabetes States sugars have been fairly well-controlled at home but forgot to bring log book. Did not show for lab appointment for lipids and A1c.         Physical Exam  General:     alert, well-hydrated, and overweight-appearing.   Lungs:     Normal respiratory effort, chest expands symmetrically. Lungs are clear to auscultation, no crackles or wheezes. Heart:     Normal rate and regular rhythm. S1 and S2 normal without gallop, murmur, click, rub or other extra sounds. Msk:     Mild tenderness over paraspinous muscles in lumbar area. Pain localized over area of plapation. No pain with extension/flexion of the spine. McMurray's negative. Increased shooting pain with Faber's (flexion, abduction, external rotation). No knee joint swelling or effusion. No warmth or redness over the knee Extremities:     Warm, well-perfused.    Impression & Recommendations:  Problem # 1:  SCIATICA, RIGHT (ICD-724.3) Assessment: New Given lack of exam findings I'm not persuaded that this represents gout. Possible sciatica given radiation of pain below the knee. Flexeril for symptomatic  treatment. Consider lumbar x-ray if symptoms persist. Give pt h/o of multiple complaints will w/u further if this pain persists. Symptomatic, temporizing treatment for now. Advised back exercises and gave patient handout for these. Her updated medication list for this problem includes:    Bayer Childrens Aspirin 81 Mg Chew (Aspirin) .[name]... Take 1 tablet by mouth once a day    Robaxin 500 Mg Tabs (Methocarbamol) .[name]... 1 tab daily    Flexeril 5 Mg Tabs (Cyclobenzaprine hcl) .[name]... Take one by mouth at bedtime as needed for back pain  Orders: [facility]- Est Level  3 (16109)   Problem # 2:  DIABETES MELLITUS II, UNCOMPLICATED (ICD-250.00) Assessment: Unchanged Noted pt's meds. No changes. Will schedule lab appointment for A1C. Hopefully pt will be able to make it. Will consider medication if above 7.0. Her updated medication list for this problem includes:    Actos 15 Mg Tabs (Pioglitazone hcl) .[name]... Take 1 tablet by mouth once a day    Bayer Childrens Aspirin 81 Mg Chew (Aspirin) .[name]... Take 1 tablet by mouth once a day    Glucotrol Xl 10 Mg Tb24 (Glipizide) .[name]... Take 2 tablet by mouth once a day    Enalapril Maleate 20 Mg Tabs (Enalapril maleate) ..... One tablet by mouth twice a day  Orders: [facility]- Est Level  3 ([address]109)  Future Orders: A1C-[facility] (60454) ... 09/02/2008 Lipid-[facility] ([id number]) ... 09/02/2008   Complete Medication List: 1)  Actos 15 Mg Tabs (Pioglitazone hcl) .... Take 1 tablet by mouth once a day 2)  Allopurinol 100 Mg Tabs (Allopurinol) .[name].. 1 tablet by mouth once a day 3)  Bayer Childrens Aspirin 81 Mg Chew (Aspirin) .... Take 1 tablet by mouth once a day 4)  Claritin 10 Mg Tabs (Loratadine) .... Take 1 tablet by mouth once a day 5)  Glucotrol Xl 10 Mg Tb24 (Glipizide) .... Take 2 tablet by mouth once a day 6)  Hydrochlorothiazide 25 Mg Tabs (Hydrochlorothiazide) .... Take 1 tablet by mouth once a day 7)  Trazamine 50 Mg Misc (Trazodone & diet manage prod) .[name].. 1 tab by mouth at  bedtime prn 8)  Robaxin 500 Mg Tabs (Methocarbamol) .[name].. 1 tab daily 9)  Hyomax-sl 0.125 Mg Subl (Hyoscyamine sulfate) .... 2 tabs by mouth once daily 10)  Flonase 50 Mcg/act Susp (Fluticasone propionate) .[name].. 1 spray each nostril daily 11)  Sertraline Hcl 100 Mg Tabs (Sertraline hcl) .... Two tabs by mouth daily 12)  Atenolol 100 Mg Tabs (Atenolol) .... Take one by mouth daily 13)  Enalapril Maleate 20 Mg Tabs (Enalapril maleate) .... One tablet by mouth twice a day 14)  Tussionex Pennkinetic Er 8-10 Mg/66ml Lqcr (Chlorpheniramine-hydrocodone) .... One teaspoon every 12 hours as needed for cough. dispense 60 ml or standard amount 15)  Flexeril 5 Mg Tabs (Cyclobenzaprine hcl) .... Take one by mouth at bedtime as needed for back pain   Patient Instructions: 1)  Schedule a lab appointment in the next week for checking your cholesterol and your hgb A1c.  2)  Do some of the exercises for back pain.  3)  Take the flexeril at night to help with your back pain. Be careful with this medicine as it can make people feel loopy someitmes. 4)  Nice to see you today.   Prescriptions: FLEXERIL 5 MG  TABS (CYCLOBENZAPRINE HCL) take one by mouth at bedtime as needed for back pain  #30 x 0   Entered and Authorized by:   Myrtie Soman  MD   Signed by:   Myrtie Soman  MD on 09/03/2007   Method used:   Telephoned to ...       [facility]       [address]       [address] [place]       [place], [place]  [address]       Ph: [phone]       Fax: [phone]   RxID:   [id number]  ]

## 2010-05-08 NOTE — Miscellaneous (Signed)
Summary: Orders Update  Clinical Lists Changes  Orders: Added new Test order of [facility]- Est Level  3 (99213) - Signed  

## 2010-05-08 NOTE — Miscellaneous (Signed)
Summary: Dr. Gracy Racer  See MD box for historic records, enter any info, route any scanned requests back to "to be scanned box".

## 2010-05-08 NOTE — Progress Notes (Signed)
Summary: order not signed  Phone Note Other Incoming Call back at [phone]   Summary of Call: Jametta/[facility], sts she received packet to start home care services, one of the pages was not signed, needs the PACT form signed and faxed back to them at [phone] Initial call taken by: ERIN LEVAN,  April 24, 2007 8:34 AM  Follow-up for Phone Call        unable to get thru by phone. faxed them a note asking that the unsigned page be faxed back for signature Follow-up by: Golden Circle RN,  April 24, 2007 2:14 PM         Appended Document: order not signed Shellia Carwin is calling again, sts they faxed the PACT form back to [place] and they have not received it from [place], would like call back with status.  Appended Document: order not signed I do not see thsi from in your box, have we already sent it back?  Appended Document: order not signed I haven't seen the re-faxed documents. I filled out some paperwork for her last week or the week before, I believe. They will need to re-fax the form if they haven't received it back from [place] yet. Thanks.  Appended Document: order not signed Recieved forms, will ask MD to sign again.  Appended Document: order not signed MD signed and jametta infomred we are faxing back to [phone]

## 2010-05-08 NOTE — Letter (Signed)
Summary: CMN for CPAP/[facility]  CMN for CPAP/[facility]   Imported By: Lanelle Bal 07/03/2009 13:02:56  _____________________________________________________________________  External Attachment:    Type:   Image     Comment:   External Document

## 2010-05-08 NOTE — Letter (Signed)
Summary: Generic Letter  [facility]  [address]   [place], [place] [address]   Phone: [phone]  Fax: [phone]    05/29/2007  [facility] [address] [place],   [address]  Dear Ms. Mallicoat,  Your Hemoglobin A1C was 7.1. Our goal for you is below 7.0 so it's not too bad but could be better. At this point I don't want to increase your medications because I think we can improve this by watching your diet (fewer sweets, more vegetables). Continue to keep a log of your blood sugars so that we can review it at your next visit. Overall, your doing a good job with things.  Sincerely,     [name]  MD [facility]

## 2010-05-08 NOTE — Progress Notes (Signed)
  Phone Note Outgoing Call   Call placed by: Myrtie Soman  MD,  December 25, 2006 1:21 PM Call placed to: Patient Action Taken: Phone Call Completed Summary of Call: Called pt. Told her that her Cr test was good and that it would be okay to take the Enalapril at 40 mg per day. Pt expressed understanding and agreement.

## 2010-05-08 NOTE — Assessment & Plan Note (Signed)
Summary: fu/el   Vital Signs:  Patient Profile:   65 Years Old Female Height:     63 inches Weight:      211.5 pounds BMI:     37.60 Temp:     97.5 degrees F Pulse rate:   63 / minute BP sitting:   132 / 66  (left arm)  Pt. in pain?   yes    Location:   knees    Intensity:   4  Vitals Entered By: Starleen Blue RN (February 24, 2007 2:56 PM)                  PCP:  Myrtie Soman  MD  Chief Complaint:  check bp.  History of Present Illness: Pt comes in for f/u of her BP today. States that she has not yet filled her atenolol prescription but will be doing so soon. Denies any problems with taking her medications. States that she hasn't had any problems with HA, speech, or gait. Uses her cane to walk "when I'm taking my medication" meaning that she uses her can in the morning (after asking her to clarify).  Of note pt had concerning BP readings from home at previous visit. After having pt come back into clinic for recheck by our staff, BP was noted to be improved but still quite variable. with Readings from the 110's systolic to the 180's. Brought in readings again today that were more reassuring. Ranging from 120's to 150's systolic for the most part. Occasional pressure into the 160's. Pt is not sure why there is variability in her readings.  Pt c/o of mild cough recently and thinks she can feel bronchitis coming on. States she is doing well otherwise.      Past Medical History:    Reviewed history from 12/24/2006 and no changes required:       Gout  274       DM type II       HTN  Past Surgical History:    Reviewed history from 06/05/2006 and no changes required:       echo 6/95 -, Hysterectomy--partial -, L rotator cuff repair 11/07-[facility] - 03/14/2006, shoulder surgery w/ Dr. Criss Alvine -, tonsillectomy -   Family History:    Reviewed history from 06/05/2006 and no changes required:       Mom w/ DM, HTN.  Sister w/ DM.  No breast Ca.  Social History:    Reviewed  history from 01/23/2007 and no changes required:       Divorced '97.  Lives alone, smokes.  No etoh.  Applying for disabilty through [facility] (ortho). Has trouble affording medications.    Review of Systems       denies any fevers, chills, problems with balance/gait.   Physical Exam  General:     Well-developed,well-nourished,in no acute distress; alert,appropriate and cooperative throughout examination Head:     Normocephalic and atraumatic without obvious abnormalities. No apparent alopecia or balding. Eyes:     EOMI intact Lungs:     Normal respiratory effort, chest expands symmetrically. Lungs are clear to auscultation, no crackles or wheezes. Normal WOB. Heart:     Normal rate and regular rhythm. S1 and S2 normal without gallop, murmur, click, rub or other extra sounds. Abdomen:     Bowel sounds positive,abdomen soft and non-tender without masses, organomegaly or hernias noted. Pulses:     DP, radial pulses normal Extremities:     No clubbing, cyanosis, edema. Neurologic:  Grossly intact. Gait somewhat slow, but generally normal. Skin:     Intact without suspicious lesions or rashes Psych:     Cognition and judgment appear intact. Alert and cooperative with normal attention span and concentration. No apparent delusions, illusions, hallucinations    Impression & Recommendations:  Problem # 1:  HYPERTENSION, BENIGN SYSTEMIC (ICD-401.1) Pt still has not gotten new BP medication. Question whether pt is taking medication appropriately but patient adamantly assures me that she is taking her medications on schedule and without trouble. Think that atenolol once daily might help with variability in pressure readings, but if patient is not taking medications regularly this may not be the solution. Advised pt about sx's of dizzness, HA, difficulty with speech/gait that would necessitate immediate medical attention. Pt expresses agreement and understanding. Her updated medication  list for this problem includes:    Enalapril-hydrochlorothiazide 5-12.5 Mg Tabs (Enalapril-hydrochlorothiazide) .[name]... Take 2 tabs by mouth daily    Hydrochlorothiazide 25 Mg Tabs (Hydrochlorothiazide) .[name]... Take 1 tablet by mouth once a day    Atenolol 100 Mg Tabs (Atenolol) .[name]... Take one by mouth daily  Orders: [facility]- Est Level  3 (47829)   Complete Medication List: 1)  Actos 15 Mg Tabs (Pioglitazone hcl) .... Take 1 tablet by mouth once a day 2)  Allopurinol 100 Mg Tabs (Allopurinol) .[name].. 1 tablet by mouth once a day 3)  Bayer Childrens Aspirin 81 Mg Chew (Aspirin) .... Take 1 tablet by mouth once a day 4)  Claritin 10 Mg Tabs (Loratadine) .... Take 1 tablet by mouth once a day 5)  Enalapril-hydrochlorothiazide 5-12.5 Mg Tabs (Enalapril-hydrochlorothiazide) .... Take 2 tabs by mouth daily 6)  Glucotrol Xl 10 Mg Tb24 (Glipizide) .... Take 2 tablet by mouth once a day 7)  Hydrochlorothiazide 25 Mg Tabs (Hydrochlorothiazide) .... Take 1 tablet by mouth once a day 8)  Trazamine 50 Mg Misc (Trazodone & diet manage prod) .[name].. 1 tab by mouth at bedtime prn 9)  Robaxin 500 Mg Tabs (Methocarbamol) .[name].. 1 tab daily 10)  Prednisone 20 Mg Tabs (Prednisone) .[name].. 1 tab by mouth for 7 days 11)  Hyomax-sl 0.125 Mg Subl (Hyoscyamine sulfate) .... 2 tabs by mouth once daily 12)  Flonase 50 Mcg/act Susp (Fluticasone propionate) .[name].. 1 spray each nostril daily 13)  Sertraline Hcl 100 Mg Tabs (Sertraline hcl) .... Two tabs by mouth daily 14)  Atenolol 100 Mg Tabs (Atenolol) .... Take one by mouth daily   Patient Instructions: 1)  Get the new medicine when you get the chance, we'll see how your blood pressures are on it. 2)  Come back in 2 months to see how things are going.  3)  Nice to see you today.    ]

## 2010-05-08 NOTE — Assessment & Plan Note (Signed)
Summary: discuss labs wp   Vital Signs:  Patient Profile:   65 Years Old Female Height:     63 inches Weight:      213.2 pounds Temp:     98.1 degrees F Pulse rate:   57 / minute BP sitting:   176 / 66  (left arm)  Pt. in pain?   no  Vitals Entered By: Jacki Cones RN (November 25, 2007 3:35 PM)                   PCP:  Myrtie Soman  MD  Chief Complaint:  f/u cholesterol, dizziness, and BP.  History of Present Illness: 1. Dizziness Pt seen at [facility] ED 8/18 and diagnosised with vertigo. Pt states that she has had several days of sporadic dizziness. Volunteers that "the room is spinning." States it is associated with position changes such as turning in bed. Has had associated nausea. Has not fallen. Typically walks with a cane.  2. f/u cholesterol Pt triglycerides were > 450 at last visit, LDL not calculated. Wants to know what she should do about this.   3. BP States that she has been taking her BP meds regularly. No difficulty with walking except as associated with vertigo as previously described. BP on recheck 175/78. Does not c/o HA. No chest pain or unusual SOB.   4. tobacco Pt mentions that she is interested in trying nicotine patches to help stop smoking.      Risk Factors:     Counseled to quit/cut down tobacco use:  yes    Physical Exam  General:     alert and overweight-appearing.  Pleasant, appropriate Eyes:     No corneal or conjunctival inflammation noted. EOMI. Perrla. Funduscopic exam benign, without hemorrhages, exudates or papilledema. Vision grossly normal. Lungs:     Normal respiratory effort, chest expands symmetrically. Lungs are clear to auscultation, no crackles or wheezes. Heart:     Normal rate and regular rhythm. S1 and S2 normal without gallop, murmur, click, rub or other extra sounds. Abdomen:     soft, non-tender, and normal bowel sounds.  non-tender Pulses:     2+ DP/radial pulses Neurologic:     Speech articulate. Gait normal.  Able to transfer from chair to exam table w/o assistance/difficulty. Dix-Hallpike reproduces symptoms. No obvious nystagmus appreciated.    Impression & Recommendations:  Problem # 1:  BENIGN POSITIONAL VERTIGO (ICD-386.11) Assessment: New Dix-Hallpike reveals no nystagmus but does recreate symptoms. Suspect BPV. Epley maneuver deferred by pt as her ride was waiting. Will give meclizine as needed and follow-up soon. Continue to follow BP as changes in this certainly may contribute. Her updated medication list for this problem includes:    Claritin 10 Mg Tabs (Loratadine) .[name]... Take 1 tablet by mouth once a day    Meclizine Hcl 25 Mg Tabs (Meclizine hcl) ..... One by mouth 2-3 times a day as needed for vertigo  Orders: [facility]- Est  Level 4 ([address]6578)   Problem # 2:  DYSLIPIDEMIA (ICD-272.4) Assessment: New Triglycerides elevated, HDL low. Will obtain direct LDL, TSH (pt to return for labs on 8/21). Will target LDL then HDL then triglycerides with diabetes in mind.  Problem # 3:  HYPERTENSION, BENIGN SYSTEMIC (ICD-401.1) Assessment: Deteriorated Worse than usual. HR borderline low, therefore will decrease b-blocker and add CCB to maximize regimen. follow-up BP check on Friday. Follow-up soon. Her updated medication list for this problem includes:    Hydrochlorothiazide 25 Mg Tabs (Hydrochlorothiazide) .[name]... Take  1 tablet by mouth once a day    Atenolol 50 Mg Tabs (Atenolol) ..... One by mouth daily    Enalapril Maleate 20 Mg Tabs (Enalapril maleate) ..... One tablet by mouth twice a day    Amlodipine Besylate 10 Mg Tabs (Amlodipine besylate) ..... One by mouth daily  Orders: [facility]- Est  Level 4 (81191)   Problem # 4:  TOBACCO DEPENDENCE (ICD-305.1) Assessment: Unchanged Pt requesting nicotine patches. At this time advised that pt should wait until dizziness improves, BP better controlled and cholesterol is sorted out. Pt is agreeable. Continue to address.  Complete Medication List: 1)   Actos 15 Mg Tabs (Pioglitazone hcl) .... Take 1 tablet by mouth once a day 2)  Allopurinol 100 Mg Tabs (Allopurinol) .[name].. 1 tablet by mouth once a day 3)  Bayer Childrens Aspirin 81 Mg Chew (Aspirin) .... Take 1 tablet by mouth once a day 4)  Claritin 10 Mg Tabs (Loratadine) .... Take 1 tablet by mouth once a day 5)  Glucotrol Xl 10 Mg Tb24 (Glipizide) .... Take 2 tablet by mouth once a day 6)  Hydrochlorothiazide 25 Mg Tabs (Hydrochlorothiazide) .... Take 1 tablet by mouth once a day 7)  Trazamine 50 Mg Misc (Trazodone & diet manage prod) .[name].. 1 tab by mouth at bedtime prn 8)  Hyomax-sl 0.125 Mg Subl (Hyoscyamine sulfate) .... 2 tabs by mouth once daily 9)  Flonase 50 Mcg/act Susp (Fluticasone propionate) .[name].. 1 spray each nostril daily 10)  Sertraline Hcl 100 Mg Tabs (Sertraline hcl) .... Two tabs by mouth daily 11)  Atenolol 50 Mg Tabs (Atenolol) .... One by mouth daily 12)  Enalapril Maleate 20 Mg Tabs (Enalapril maleate) .... One tablet by mouth twice a day 13)  Tussionex Pennkinetic Er 8-10 Mg/67ml Lqcr (Chlorpheniramine-hydrocodone) .... One teaspoon every 12 hours as needed for cough. dispense 60 ml or standard amount 14)  Flexeril 5 Mg Tabs (Cyclobenzaprine hcl) .... Take one by mouth at bedtime as needed for back pain 15)  Amlodipine Besylate 10 Mg Tabs (Amlodipine besylate) .... One by mouth daily 16)  Meclizine Hcl 25 Mg Tabs (Meclizine hcl) .... One by mouth 2-3 times a day as needed for vertigo  Other Orders: Future Orders: Direct LDL-[facility] ([id number]) ... 11/24/2008 TSH-[facility] [phone]) ... 11/24/2008   Patient Instructions: 1)  Come back for a blood pressure recheck on Friday. 2)  Start taking amlodipine (also called norvasc) once a day. 3)  Decrease atenolol to 50 mg once a day. 4)  Follow-up in 2-3 weeks. 5)  Take meclizine as needed for vertigo.   Prescriptions: MECLIZINE HCL 25 MG  TABS (MECLIZINE HCL) one by mouth 2-3 times a day as needed for vertigo  #30 x 0    Entered and Authorized by:   Myrtie Soman  MD   Signed by:   Myrtie Soman  MD on 11/25/2007   Method used:   Print then Give to Patient   RxID:   [id number] ATENOLOL 50 MG  TABS (ATENOLOL) one by mouth daily  #31 x 2   Entered and Authorized by:   Myrtie Soman  MD   Signed by:   Myrtie Soman  MD on 11/25/2007   Method used:   Print then Give to Patient   RxID:   [id number] AMLODIPINE BESYLATE 10 MG  TABS (AMLODIPINE BESYLATE) one by mouth daily  #31 x 2   Entered and Authorized by:   Myrtie Soman  MD   Signed by:   Myrtie Soman  MD on 11/25/2007   Method used:   Print then Give to Patient   RxID:   [id number]  ]

## 2010-05-08 NOTE — Progress Notes (Signed)
Summary: Problem with orders  Phone Note Call from Patient Call back at Home Phone [phone]   Reason for Call: Talk to Doctor Summary of Call: requesting to speak with MD re: order from [employer] for diabetic shoes and seat lift, it was not signed but the arthritis therapy pump was signed off on. Wants to know if it is resent will MD sign it? Initial call taken by: ERIN LEVAN,  June 05, 2007 2:33 PM  Follow-up for Phone Call        I will need to look at the forms to see if they are justified. Please have pt resend. Thanks. Follow-up by: [name]  MD,  June 07, 2007 8:41 PM  Additional Follow-up for Phone Call Additional follow up Details #1::        Pt informed Additional Follow-up by: [facility] CMA,  June 09, 2007 9:20 AM

## 2010-05-08 NOTE — Consult Note (Signed)
Summary: [place]   Imported By: Drucie Ip 11/03/2006 14:26:12  _____________________________________________________________________  External Attachment:    Type:   Image     Comment:   External Document

## 2010-05-08 NOTE — Assessment & Plan Note (Signed)
Summary: routine visit/el   Vital Signs:  Patient Profile:   65 Years Old Female Height:     63 inches Weight:      218.5 pounds BMI:     38.85 Temp:     98.2 degrees F Pulse rate:   59 / minute BP sitting:   144 / 97  (left arm)  Pt. in pain?   no  Vitals Entered By: Starleen Blue RN (May 29, 2007 9:27 AM)                  PCP:  Myrtie Soman  MD  Chief Complaint:  f/u BP and DM; new cough.  History of Present Illness: Bonnie Peterson is a pleasant 65 yo female who presents today for f/u regarding her bp and DM type II.  1. DM type 2 Her blood glucose levels have been averaging from the 110s-229 in the morning. She recognizes spikes in her levels when she's had sweets (Oreos). Denies headaches, dizziness, and increase in urine frequency. Pt goes to exercise class on Wednesdays. Describes diet as "ok,"eating lots of fruits and vegetables, and drinks lots of water.   2. BP Pt says that blood pressure is not where she wants it to be. Taking all medications as prescribed. Would like better control of her bp. Has been taking atenolol after getting rx filled. Denies HA or vision changes. Denies problem with speech or gait. States that she is able to read and write normally.  3. cough Pt states that her "smoker's cough" is active. Productive of yellow sputum. Denies runny nose, congestion, respiratory difficulty. Denies chest pain. Pt is still smoking 1/2 pack a day but when asked states she is is interested in quitting at some point.      Past Medical History:    Gout  274    DM type II    HTN    Osteoarthritis    Risk Factors:     Counseled to quit/cut down tobacco use:  yes   Review of Systems  The patient denies anorexia, fever, and chest pain.         denies respiratory difficulty   Physical Exam  General:     Obese, alert/appropriate. NAD Eyes:     No corneal or conjunctival inflammation noted. EOMI. Perrla. Funduscopic exam benign, without  hemorrhages, exudates or papilledema. Vision grossly normal. Nose:     External nasal examination shows no deformity or inflammation. Nasal mucosa are pink and moist without lesions or exudates. Mouth:     Oral mucosa and oropharynx without lesions or exudates.  Teeth in good repair. Neck:     No deformities, masses, or tenderness noted. Lungs:     Normal respiratory effort, chest expands symmetrically. Lungs are clear to auscultation, no crackles or wheezes. Heart:     Normal rate and regular rhythm. S1 and S2 normal without gallop, murmur, click, rub or other extra sounds. Neurologic:     alert & oriented X3, strength normal in all extremities, and gait normal.  speech regular with normal rhythm Skin:     Intact without suspicious lesions or rashes    Impression & Recommendations:  Problem # 1:  DIABETES MELLITUS II, UNCOMPLICATED (ICD-250.00) Assessment: Unchanged A1C = 7.1 today. Deteriorated over last measurement. C. Last  ~6.2 in 8/08. Blood sugar diary shows reasonable control (most readings in low 100s). Consider increase in glucotrol at next visit. Follow blood sugar diary. Will send letter to patient regarding this.  Her  updated medication list for this problem includes:    Actos 15 Mg Tabs (Pioglitazone hcl) .[name]... Take 1 tablet by mouth once a day    Bayer Childrens Aspirin 81 Mg Chew (Aspirin) .[name]... Take 1 tablet by mouth once a day    Enalapril-hydrochlorothiazide 5-12.5 Mg Tabs (Enalapril-hydrochlorothiazide) .[name]... Take 2 tabs by mouth daily    Glucotrol Xl 10 Mg Tb24 (Glipizide) .[name]... Take 2 tablet by mouth once a day    Enalapril Maleate 10 Mg Tabs (Enalapril maleate) ..... One by mouth daily  Orders: [facility] (16109) [facility]- Est  Level 4 (60454)   Problem # 2:  HYPERTENSION, BENIGN SYSTEMIC (ICD-401.1) Assessment: Improved BP reading not optimal but improved over previous visits. Will maximize enalapril to 20mg  daily. Will check BMP to assess creatinine. Continue to  monitor. Advised f/u in 3 months. Her updated medication list for this problem includes:    Enalapril-hydrochlorothiazide 5-12.5 Mg Tabs (Enalapril-hydrochlorothiazide) .[name]... Take 2 tabs by mouth daily    Hydrochlorothiazide 25 Mg Tabs (Hydrochlorothiazide) .[name]... Take 1 tablet by mouth once a day    Atenolol 100 Mg Tabs (Atenolol) .[name]... Take one by mouth daily    Enalapril Maleate 10 Mg Tabs (Enalapril maleate) ..... One by mouth daily  Orders: Basic Met[facility] [phone]) [facility]- Est  Level 4 (29562)   Problem # 3:  COUGH (ICD-786.2) Assessment: New Afebrile. No associated congestion/rhinorrhea. ? whether this is due to chronic smoking. Gave tussionex for symptomatic treatment. Lung exam benign. Continue to follow. Continue to address smoking. Advised pt to consider scheduling appointment to discuss just smoking cessation when ready. Orders: [facility]- Est  Level 4 (99214)   Complete Medication List: 1)  Actos 15 Mg Tabs (Pioglitazone hcl) .... Take 1 tablet by mouth once a day 2)  Allopurinol 100 Mg Tabs (Allopurinol) .[name].. 1 tablet by mouth once a day 3)  Bayer Childrens Aspirin 81 Mg Chew (Aspirin) .... Take 1 tablet by mouth once a day 4)  Claritin 10 Mg Tabs (Loratadine) .... Take 1 tablet by mouth once a day 5)  Enalapril-hydrochlorothiazide 5-12.5 Mg Tabs (Enalapril-hydrochlorothiazide) .... Take 2 tabs by mouth daily 6)  Glucotrol Xl 10 Mg Tb24 (Glipizide) .... Take 2 tablet by mouth once a day 7)  Hydrochlorothiazide 25 Mg Tabs (Hydrochlorothiazide) .... Take 1 tablet by mouth once a day 8)  Trazamine 50 Mg Misc (Trazodone & diet manage prod) .[name].. 1 tab by mouth at bedtime prn 9)  Robaxin 500 Mg Tabs (Methocarbamol) .[name].. 1 tab daily 10)  Hyomax-sl 0.125 Mg Subl (Hyoscyamine sulfate) .... 2 tabs by mouth once daily 11)  Flonase 50 Mcg/act Susp (Fluticasone propionate) .[name].. 1 spray each nostril daily 12)  Sertraline Hcl 100 Mg Tabs (Sertraline hcl) .... Two tabs by mouth daily 13)   Atenolol 100 Mg Tabs (Atenolol) .... Take one by mouth daily 14)  Enalapril Maleate 10 Mg Tabs (Enalapril maleate) .... One by mouth daily 15)  Tussionex Pennkinetic Er 8-10 Mg/52ml Lqcr (Chlorpheniramine-hydrocodone) .... One teaspoon every 12 hours as needed for cough. dispense 60 ml or standard amount   Patient Instructions: 1)  We are going to increase your dose of Enalapril to help with your blood pressure. Take an additional 10 mg tablet once a day. Your goal blood pressure is 130/70 because you have diabetes. 2)  Continue keeping a log of your sugar levels.  3)  Schedule a follow-up appointment in 3 months. 4)  Take the cough syrup to help with your cough. Come back if  your not better in 2-3 weeks.    Prescriptions: ENALAPRIL MALEATE 10 MG  TABS (ENALAPRIL MALEATE) one by mouth daily  #30 x 3   Entered and Authorized by:   Myrtie Soman  MD   Signed by:   Myrtie Soman  MD on 05/29/2007   Method used:   Print then Give to Patient   RxID:   [id number] TUSSIONEX PENNKINETIC ER 8-10 MG/5ML  LQCR (CHLORPHENIRAMINE-HYDROCODONE) one teaspoon every 12 hours as needed for cough. Dispense 60 ml or standard amount  #1 x 0   Entered and Authorized by:   Myrtie Soman  MD   Signed by:   Myrtie Soman  MD on 05/29/2007   Method used:   Print then Give to Patient   RxID:   [id number]  ] Laboratory Results   Blood Tests   Date/Time Received: May 29, 2007 10:10  AM  Date/Time Reported: May 29, 2007 10:57 AM   HGBA1C: 7.1%   (Normal Range: Non-Diabetic - 3-6%   Control Diabetic - 6-8%)  Comments: ...............test performed by......Marland KitchenBonnie A. [place], [place] (ASCP)

## 2010-05-08 NOTE — Miscellaneous (Signed)
Summary: Orders Update  Clinical Lists Changes  Orders: Added new Test order of Basic Met-[facility] [phone]) - Signed

## 2010-05-08 NOTE — Assessment & Plan Note (Signed)
Summary: BP check  Nurse Visit   Vital Signs:  Patient Profile:   65 Years Old Female Height:     63 inches Pulse rate:   61 / minute BP sitting:   117 / 60  Vitals Entered By: Jone Baseman CMA (January 26, 2007 11:53 AM)                 Prior Medications: ACTOS 15 MG TABS (PIOGLITAZONE HCL) Take 1 tablet by mouth once a day ALLOPURINOL 100 MG TABS (ALLOPURINOL) 1 tablet by mouth once a day BAYER CHILDRENS ASPIRIN 81 MG CHEW (ASPIRIN) Take 1 tablet by mouth once a day CLARITIN 10 MG TABS (LORATADINE) Take 1 tablet by mouth once a day ENALAPRIL-HYDROCHLOROTHIAZIDE 5-12.5 MG  TABS (ENALAPRIL-HYDROCHLOROTHIAZIDE) take 2 tabs by mouth daily GLUCOTROL XL 10 MG TB24 (GLIPIZIDE) Take 2 tablet by mouth once a day HYDROCHLOROTHIAZIDE 25 MG TABS (HYDROCHLOROTHIAZIDE) Take 1 tablet by mouth once a day TRAZAMINE 50 MG  MISC (TRAZODONE & DIET MANAGE PROD) 1 tab by mouth at bedtime prn ROBAXIN 500 MG  TABS (METHOCARBAMOL) 1 tab daily PREDNISONE 20 MG  TABS (PREDNISONE) 1 tab by mouth for 7 days HYOMAX-SL 0.125 MG  SUBL (HYOSCYAMINE SULFATE) 2 tabs by mouth once daily FLONASE 50 MCG/ACT  SUSP (FLUTICASONE PROPIONATE) 1 spray each nostril daily SERTRALINE HCL 100 MG  TABS (SERTRALINE HCL) two tabs by mouth daily ATENOLOL 100 MG  TABS (ATENOLOL) take one by mouth daily     Orders Added: 1)  Est Level 1- [facility] [16109]    ]

## 2010-05-08 NOTE — Assessment & Plan Note (Signed)
Summary: Tobacco Cessation Group Class - Rx    Primary Care Cheron Pasquarelli:  Bonnie Norrie  MD   History of Present Illness: Bonnie Peterson presents to Smoking cessation group class expressing a readiness to quit.  She rates her readiness a 7 out of 10, with 10 being most ready.  Pt reports trying to quit in the past, with little/no success.  Started smoking at the age of 55, pt has now been smoking for 38 years.  Pt smokes 1/2 ppd of "First One", full-flavor cigarettes, with a 19 pack year history.  Pt does have family members and friends who smoke.    Habits & Providers  Alcohol-Tobacco-Diet     Tobacco Status: current     Tobacco Counseling: to quit use of tobacco products     Cigarette Packs/Day: 0.5     Year Started: 1972     Passive Smoke Exposure: yes  Current Medications (verified): 1)  Actos 30 Mg Tabs (Pioglitazone Hcl) .... One By Mouth Daily 2)  Allopurinol 200 Mg Tab .... Take 1 Tab By Mouth Daily For Gout 3)  Bayer Childrens Aspirin 81 Mg Chew (Aspirin) .... Take 1 Tablet By Mouth Once A Day 4)  Claritin 10 Mg Tabs (Loratadine) .... Take 1 Tablet By Mouth Once A Day 5)  Glucotrol Xl 10 Mg Tb24 (Glipizide) .... Take 2 Tablet By Mouth Once A Day 6)  Hydrochlorothiazide 25 Mg Tabs (Hydrochlorothiazide) .... Take 1 Tablet By Mouth Once A Day 7)  Trazamine 50 Mg  Misc (Trazodone & Diet Manage Prod) .[name].. 1 Tab By Mouth At Bedtime Prn 8)  Dicyclomine Hcl 20 Mg Tabs (Dicyclomine Hcl) .... One By Mouth Four Times Daily 9)  Flonase 50 Mcg/act  Susp (Fluticasone Propionate) .[name].. 1 Spray Each Nostril Daily 10)  Sertraline Hcl 100 Mg  Tabs (Sertraline Hcl) .... Two Tabs By Mouth Daily 11)  Atenolol 50 Mg  Tabs (Atenolol) .... One By Mouth Daily 12)  Enalapril Maleate 20 Mg  Tabs (Enalapril Maleate) .... One Tablet By Mouth Twice A Day 13)  Guaifenesin-Codeine 100-10 Mg/4m Liqd (Guaifenesin-Codeine) .... Take 1-2 Teaspoons Every 6 Hours As Needed For Cough; Disp 60 Cc 14)  Flexeril  5 Mg  Tabs (Cyclobenzaprine Hcl) .... Take One By Mouth At Bedtime As Needed For Back Pain 15)  Amlodipine Besylate 10 Mg  Tabs (Amlodipine Besylate) .... One By Mouth Daily 16)  Ultram 50 Mg Tabs (Tramadol Hcl) .... One By Mouth Every 6 Hours 17)  Nitrostat 0.4 Mg Subl (Nitroglycerin) .... One By Mouth Sublingually As Needed For Chest Pain  Allergies (verified): No Known Drug Allergies  Social History: Packs/Day:  0.5 Passive Smoke Exposure:  yes   Impression & Recommendations:  Problem # 1:  TOBACCO DEPENDENCE (ICD-305.1) Assessment Unchanged Moderate nicotine abuse of 38 years duration in a patient who is fair candidate for success bc of self-readiness rating of 7/10, and multiple failed quit attempts.  Pt will initiate NRT including patches and gum.  Patient has been counseled on the purpose, proper use, and potential adverse effects of nicotine replacement, including sleeplessness, and GI upset.  Pt has agreed to purchase her own NRT, and has been advised to purchase the 14 mg patches (begin with Step 2).  F/U office visit scheduled for next smoking cessation group class in January.  F/U phone calls will be made the day after quit date as well as periodically afterwards and by request.  Patient seen with EMargaretha Sheffield PharmD Resident, and PNicholas Lose  PharmD, CPP.    Orders: Smoking Cessation Class (Q9450)  Complete Medication List: 1)  Actos 30 Mg Tabs (Pioglitazone hcl) .... One by mouth daily 2)  Allopurinol 200 Mg Tab  .... Take 1 tab by mouth daily for gout 3)  Bayer Childrens Aspirin 81 Mg Chew (Aspirin) .... Take 1 tablet by mouth once a day 4)  Claritin 10 Mg Tabs (Loratadine) .... Take 1 tablet by mouth once a day 5)  Glucotrol Xl 10 Mg Tb24 (Glipizide) .... Take 2 tablet by mouth once a day 6)  Hydrochlorothiazide 25 Mg Tabs (Hydrochlorothiazide) .... Take 1 tablet by mouth once a day 7)  Trazamine 50 Mg Misc (Trazodone & diet manage prod) .[name].. 1 tab by mouth at bedtime  prn 8)  Dicyclomine Hcl 20 Mg Tabs (Dicyclomine hcl) .... One by mouth four times daily 9)  Flonase 50 Mcg/act Susp (Fluticasone propionate) .[name].. 1 spray each nostril daily 10)  Sertraline Hcl 100 Mg Tabs (Sertraline hcl) .... Two tabs by mouth daily 11)  Atenolol 50 Mg Tabs (Atenolol) .... One by mouth daily 12)  Enalapril Maleate 20 Mg Tabs (Enalapril maleate) .... One tablet by mouth twice a day 13)  Guaifenesin-codeine 100-10 Mg/74m Liqd (Guaifenesin-codeine) .... Take 1-2 teaspoons every 6 hours as needed for cough; disp 60 cc 14)  Flexeril 5 Mg Tabs (Cyclobenzaprine hcl) .... Take one by mouth at bedtime as needed for back pain 15)  Amlodipine Besylate 10 Mg Tabs (Amlodipine besylate) .... One by mouth daily 16)  Ultram 50 Mg Tabs (Tramadol hcl) .... One by mouth every 6 hours 17)  Nitrostat 0.4 Mg Subl (Nitroglycerin) .... One by mouth sublingually as needed for chest pain

## 2010-05-08 NOTE — Assessment & Plan Note (Signed)
Summary: fu wp   Vital Signs:  Patient Profile:   65 Years Old Female Height:     63 inches Weight:      209 pounds Temp:     98.5 degrees F Pulse rate:   58 / minute BP sitting:   154 / 74  Pt. in pain?   yes    Location:   hands and knees    Intensity:   5 or 6  Vitals Entered By: Christen Bame CMA (January 23, 2007 9:07 AM)                  PCP:  [name]  MD  Chief Complaint:  f/u BP.  History of Present Illness: Bonnie Peterson comes in today to f/u on BP. Brings in her readings which she states are taking via a machine by a nursing assistant who visits her house. BP today 154/74 which is slightly up from usual; however, pt has recorded readings that are quite erratic. As low as 110s over 60s to as high as 210s/140s. The pressure readings are taken on each arm and are quite variable between measurements as well, not favoring a trend in one are over the other. Pt states that the nurse assistant is recording the readings. One reading as "120/174" so it is difficult to assess adequacy/accuracy of measurements.  Pt emphatically denies any problems taking her medications. States that she has experienced no symptoms of high blood pressure like dizziness or difficulty walking. Does state that she occasionally "sees spots".  This is not concerning to the pt, however.  Pt states that she has been taking enalapril 40 mg daily, plus HCTZ, plus her metroprolol.  Hypertension History:      Positive major cardiovascular risk factors include female age 48 years old or older, diabetes, hypertension, and current tobacco user.       Past Medical History:    Reviewed history from 12/24/2006 and no changes required:       Gout  274       DM type II       HTN  Past Surgical History:    Reviewed history from 06/05/2006 and no changes required:       echo 6/95 -, Hysterectomy--partial -, L rotator cuff repair 11/07-[facility] - 03/14/2006, shoulder surgery w/ Dr. Jenean Lindau -, tonsillectomy  -   Family History:    Reviewed history from 06/05/2006 and no changes required:       Mom w/ DM, HTN.  Sister w/ DM.  No breast Ca.  Social History:    Reviewed history from 12/24/2006 and no changes required:       Divorced '97.  Lives alone, smokes.  No etoh.  Applying for disabilty through [facility] ([facility]). Has trouble affording medications.    Review of Systems       denies dizziness, difficulty walking. Does report occasionally seeing spots.   Physical Exam  General:     Well-developed,well-nourished,in no acute distress; alert,appropriate and cooperative throughout examination Head:     Normocephalic and atraumatic without obvious abnormalities. No apparent alopecia or balding. Neck:     no carotid bruits Lungs:     Normal respiratory effort, chest expands symmetrically. Lungs are clear to auscultation, no crackles or wheezes. Heart:     Normal rate and regular rhythm. S1 and S2 normal without gallop, murmur, click, rub or other extra sounds. Abdomen:     obese, + BS, soft, non-tender, non-distended. No renal artery bruits on auscultation.  Skin:     Intact without suspicious lesions or rashes Psych:     Cognition and judgment appear intact. Alert and cooperative with normal attention span and concentration. No apparent delusions, illusions, hallucinations    Impression & Recommendations:  Problem # 1:  HYPERTENSION, BENIGN SYSTEMIC (ICD-401.1) Given the pt's concerning (but questionable) home readings, will bring pt in next M, W, F for serial BP checks. For now, will write to switch from metoprolol two times a day to atenolol once daily to achieve easier medication administration and hopefully a longer-acting effect. Pt states that she might not be able to fill this prescription until the end of the month. Instructions given re: need to go to ER for any BP readings >027 systolic or 741 diastolic. Pt expresses understanding and agreement. Will have pt f/u week after next  to reasses and consider further management of BP. Could consider addition of CCB to current regimen. Note that HR was 58 today in clinic.   The following medications were removed from the medication list:    Metoprolol Tartrate 100 Mg Tabs (Metoprolol tartrate) .[name]... Take 1 tablet by mouth twice a day  Her updated medication list for this problem includes:    Enalapril-hydrochlorothiazide 5-12.5 Mg Tabs (Enalapril-hydrochlorothiazide) .[name]... Take 2 tabs by mouth daily    Hydrochlorothiazide 25 Mg Tabs (Hydrochlorothiazide) .[name]... Take 1 tablet by mouth once a day    Atenolol 100 Mg Tabs (Atenolol) .[name]... Take one by mouth daily  Orders: [facility]- Est Level  3 (28786)   Complete Medication List: 1)  Actos 15 Mg Tabs (Pioglitazone hcl) .... Take 1 tablet by mouth once a day 2)  Allopurinol 100 Mg Tabs (Allopurinol) .[name].. 1 tablet by mouth once a day 3)  Bayer Childrens Aspirin 81 Mg Chew (Aspirin) .... Take 1 tablet by mouth once a day 4)  Claritin 10 Mg Tabs (Loratadine) .... Take 1 tablet by mouth once a day 5)  Enalapril-hydrochlorothiazide 5-12.5 Mg Tabs (Enalapril-hydrochlorothiazide) .... Take 2 tabs by mouth daily 6)  Glucotrol Xl 10 Mg Tb24 (Glipizide) .... Take 2 tablet by mouth once a day 7)  Hydrochlorothiazide 25 Mg Tabs (Hydrochlorothiazide) .... Take 1 tablet by mouth once a day 8)  Trazamine 50 Mg Misc (Trazodone & diet manage prod) .[name].. 1 tab by mouth at bedtime prn 9)  Robaxin 500 Mg Tabs (Methocarbamol) .[name].. 1 tab daily 10)  Prednisone 20 Mg Tabs (Prednisone) .[name].. 1 tab by mouth for 7 days 11)  Hyomax-sl 0.125 Mg Subl (Hyoscyamine sulfate) .... 2 tabs by mouth once daily 12)  Flonase 50 Mcg/act Susp (Fluticasone propionate) .[name].. 1 spray each nostril daily 13)  Sertraline Hcl 100 Mg Tabs (Sertraline hcl) .... Two tabs by mouth daily 14)  Atenolol 100 Mg Tabs (Atenolol) .... Take one by mouth daily  Hypertension Assessment/Plan:      The patient's hypertensive risk group is  category C: Target organ damage and/or diabetes.  Today's blood pressure is 154/74.     Patient Instructions: 1)  If the top number on your blood pressure reading is over 210 you should go to the emergency room. Also, if the bottom number is over 120 you should go to the emergency room as well. 2)  Start taking the new medicine (Atenolol 100 mg) once daily. When you start the new medicine, stop taking the metoprolol. 3)  Come here next Monday, Wednesday, and Friday to get your blood pressure taken. 4)  Follow-up with the week after next. 5)  We need to keep working on your blood pressure.    Prescriptions: ATENOLOL 100 MG  TABS (ATENOLOL) take one by mouth daily  #30 x 2   Entered and Authorized by:   [name]  MD   Signed by:   [name]  MD on 01/23/2007   Method used:   Print then Give to Patient   RxID:   [id number]  ]

## 2010-05-08 NOTE — Progress Notes (Signed)
Summary: meds - Chanitx  Phone Note Call from Patient Call back at Home Phone [phone]   Caller: Patient Summary of Call: needs to fax Chantix into [facility] - f. # [phone] Initial call taken by: De Nurse,  May 12, 2009 3:21 PM  Follow-up for Phone Call        Called and reported that we would send today.  Follow-up by: Paulino Rily, PharmD

## 2010-05-08 NOTE — Assessment & Plan Note (Signed)
Summary: BP CHECK/[facility]  Nurse Visit   Vital Signs:  Patient Profile:   64 Years Old Female Height:     63 inches Pulse rate:   71 / minute BP supine:   181 / 74  Vitals Entered By: [place], (January 28, 2007 10:38 AM)                No c/o HA, dizziness, or blurred vision.  Pt. will f/u in office on 01/30/07 ...................................................................Burna Forts CMA,  January 28, 2007 10:39 AM   Prior Medications: ACTOS 15 MG TABS (PIOGLITAZONE HCL) Take 1 tablet by mouth once a day ALLOPURINOL 100 MG TABS (ALLOPURINOL) 1 tablet by mouth once a day BAYER CHILDRENS ASPIRIN 81 MG CHEW (ASPIRIN) Take 1 tablet by mouth once a day CLARITIN 10 MG TABS (LORATADINE) Take 1 tablet by mouth once a day ENALAPRIL-HYDROCHLOROTHIAZIDE 5-12.5 MG  TABS (ENALAPRIL-HYDROCHLOROTHIAZIDE) take 2 tabs by mouth daily GLUCOTROL XL 10 MG TB24 (GLIPIZIDE) Take 2 tablet by mouth once a day HYDROCHLOROTHIAZIDE 25 MG TABS (HYDROCHLOROTHIAZIDE) Take 1 tablet by mouth once a day TRAZAMINE 50 MG  MISC (TRAZODONE & DIET MANAGE PROD) 1 tab by mouth at bedtime prn ROBAXIN 500 MG  TABS (METHOCARBAMOL) 1 tab daily PREDNISONE 20 MG  TABS (PREDNISONE) 1 tab by mouth for 7 days HYOMAX-SL 0.125 MG  SUBL (HYOSCYAMINE SULFATE) 2 tabs by mouth once daily FLONASE 50 MCG/ACT  SUSP (FLUTICASONE PROPIONATE) 1 spray each nostril daily SERTRALINE HCL 100 MG  TABS (SERTRALINE HCL) two tabs by mouth daily ATENOLOL 100 MG  TABS (ATENOLOL) take one by mouth daily       ]

## 2010-05-10 NOTE — Assessment & Plan Note (Signed)
Summary: 8 MO F/U   Visit Type:  8 mo f/u Referring Provider:  Dorris Carnes Primary Provider:  [name]  MD  CC:  pt states her hands are weak... says when she uses her cpap she has more energy....LUQ cp at times....pt states she gets some calf pain at times when she walks.  History of Present Illness: Patient is a 65 year old wiht a history of CP,hypertenision, dyslipidemia, DM, sleep apnea.  I saw her back in april of last year.  Note she was admitted for atyp CP last year.  R/O for MI.  Myoview last winter was without ischemia.   She notes around Thanksgiving she had several episodes of chest discomfort.  Not associated wiht physical activity.  Most occurred while on phone.  Daughter thinks it is associated with mental stress. Not too active.  Still smokes about 1 pack per day.  Current Medications (verified): 1)  Allopurinol 300 Mg Tabs (Allopurinol) .... One By Mouth Daily To Prevent Gout 2)  Bayer Childrens Aspirin 81 Mg Chew (Aspirin) .... Take 1 Tablet By Mouth Once A Day 3)  Claritin 10 Mg Tabs (Loratadine) .... Take 1 Tablet By Mouth Once A Day 4)  Glucotrol Xl 10 Mg Tb24 (Glipizide) .... Take 2 Tablet By Mouth Once A Day 5)  Hydrochlorothiazide 25 Mg Tabs (Hydrochlorothiazide) .... Take 1 Tablet By Mouth Once A Day 6)  Dicyclomine Hcl 20 Mg Tabs (Dicyclomine Hcl) .... One By Mouth Four Times Daily 7)  Flonase 50 Mcg/act  Susp (Fluticasone Propionate) .[name].. 1 Spray Each Nostril Daily 8)  Sertraline Hcl 100 Mg  Tabs (Sertraline Hcl) .[name].. 1 Tab  By Mouth Once Daily 9)  Atenolol 50 Mg  Tabs (Atenolol) .... One By Mouth Daily 10)  Enalapril Maleate 20 Mg  Tabs (Enalapril Maleate) .... One Tablet By Mouth Twice A Day 11)  Amlodipine Besylate 10 Mg  Tabs (Amlodipine Besylate) .... One By Mouth Daily 12)  Nitrostat 0.4 Mg Subl (Nitroglycerin) .... One By Mouth Sublingually As Needed For Chest Pain 13)  Lipitor 10 Mg Tabs (Atorvastatin Calcium) .... One By Mouth Daily For  Cholesterol 14)  Actos 30 Mg Tabs (Pioglitazone Hcl) .[name].. 1 Tab Once Daily 15)  Cyclobenzaprine Hcl 5 Mg Tabs (Cyclobenzaprine Hcl) .[name].. 1 Tab Once Daily As Needed 16)  Trazodone Hcl 50 Mg Tabs (Trazodone Hcl) .[name].. 1 Tab Once Daily 17)  Colcrys 0.6 Mg Tabs (Colchicine) .[name].. 1 Tab Once Daily  Allergies (verified): 1)  ! * Metformin  Past History:  Past medical, surgical, family and social histories (including risk factors) reviewed, and no changes noted (except as noted below).  Past Medical History: Reviewed history from 08/02/2009 and no changes required. Current Problems:  HYPERTENSION, BENIGN SYSTEMIC (ICD-401.1) CHEST PAIN (ICD-786.50) SLEEP APNEA (ICD-780.57) HYPERTENSION (ICD-401.9) DYSLIPIDEMIA (ICD-272.4) OBESITY, UNSPECIFIED (ICD-278.00) OBSTRUCTIVE SLEEP APNEA (ICD-327.23) - on CPAP per Dr. 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         Past Surgical History: Reviewed history from 04/27/2009 and no changes required. Hysterectomy--partial -, L rotator cuff repair 11/07-[facility] - 03/14/2006,  R shoulder surgery w/ Dr. Jenean Lindau -,  tonsillectomy - B feet surgery  echo 10/10: mild pulm HTN, EF 60-65%, mild LVH, moderate aortic regurg  Family History: Reviewed history from 04/27/2009 and no changes required. heart disease: mother (M.I.) rheumatism: mother cancer: mother (breast), sister (pancreatic)  Mother: HTN,  DM<  sister: Dm  Social History: Reviewed history from 04/27/2009 and no changes required. Divorced '97 and now single.   Lives alone,  pt has 4 children. smokes 1/2 ppd.  started at age 72. No etoh.  Currently disabled.  Previously worked as a [employer]ar[employer] manager.  Has trouble affording medications. Recently moved to 1st floor apartment housing (end of June 2010). Likes it there.  Grandson recently sent to jail - (38 - 42 months) -- this is stressful for the patient.  Review of Systems       Systmes reviewed.  Neg to the above problem except as noted above.  Vital Signs:  Patient profile:   64 year old female Height:      62 inches Weight:      217.75 pounds BMI:     39.97 Pulse rate:   57 / minute Pulse rhythm:   irregular BP sitting:   122 / 80  (left arm) Cuff size:   large  Vitals Entered By: Julaine Hua, CMA (April 13, 2010 10:47 AM)  Physical Exam  Additional Exam:  Patient is in NAD. HEENT:  Normocephalic, atraumatic. EOMI, PERRLA.  Neck: JVP is normal. No thyromegaly. No bruits.  Lungs: clear to auscultation. No rales no wheezes.  Heart: Regular rate and rhythm. Normal S1, S2. No S3.   No significant murmurs. PMI not displaced.  Abdomen:  Supple, nontender. Normal bowel sounds. No masses. No hepatomegaly.  Extremities:   Good distal pulses throughout. No lower extremity edema.  Musculoskeletal :moving all extremities.  Neuro:   alert and oriented x3.    EKG  Procedure date:  04/13/2010  Findings:      Sinus bradycardia.  57 bpm.    Impression & Recommendations:  Problem # 1:  CHEST PAIN (ICD-786.50) I am not convinced the spells the patient had near thanksgiving represent angina.  She does have risk factors.  But, I would not sched further testing unless symtpoms change.  encouraged her to increase her activity  Problem # 2:  HYPERTENSION (ICD-401.9) Good continue. Her updated medication list for this problem includes:    Bayer Childrens Aspirin 81 Mg Chew  (Aspirin) .[name]... Take 1 tablet by mouth once a day    Hydrochlorothiazide 25 Mg Tabs (Hydrochlorothiazide) .[name]... Take 1 tablet by mouth once a day    Atenolol 50 Mg Tabs (Atenolol) ..... One by mouth daily    Enalapril Maleate 20 Mg Tabs (Enalapril maleate) ..... One tablet by mouth twice a day    Amlodipine Besylate 10 Mg Tabs (Amlodipine besylate) ..... One by mouth daily  Problem # 3:  DYSLIPIDEMIA (ICD-272.4) Need fasting labs  Contineu meds Her updated medication list for this problem includes:    Lipitor 10 Mg Tabs (Atorvastatin calcium) ..... One by mouth daily for cholesterol  Problem # 4:  TOBACCO DEPENDENCE (ICD-305.1) Counselled for 10 min on cessation.  Other Orders: EKG w/ Interpretation (93000)  Patient Instructions: 1)  Your physician wants you to follow-up in: 58month  You will receive a reminder letter in the mail two months in advance. If you don't receive a letter, please call our office to schedule the follow-up appointment.

## 2010-05-10 NOTE — Assessment & Plan Note (Addendum)
Summary: f/u visit/bmc   Vital Signs:  Patient profile:   65 year old female Height:      62 inches Weight:      218 pounds BMI:     40.02 BSA:     1.98 Temp:     98.0 degrees F Pulse rate:   64 / minute BP sitting:   115 / 60  Vitals Entered By: Christen Bame CMA (May 04, 2010 11:03 AM) CC: f/u Is Patient Diabetic? Yes Did you bring your meter with you today? No Pain Assessment Patient in pain? yes     Location: knees Intensity: 3   Primary Care Provider:  Hulan Saas DO  CC:  f/u.  History of Present Illness: BP today:115/60 Taking Meds:yes Side Effects:pt has a cough that she contributes to her atenolol ROS: denies Headache visual changes nausea vomiting abdominal pain numbness in extremities    glucose low:120 Glucose high:154 attempted to be off Actos and did not help.  Last A1C:see flow sheet Taking Meds:yes but wants to stop actos due to pt hearing it is linked to bladder cancer.  On INsulin:no  Side effects:no ROS: Denies polyuria, polydipsia, visual changes numbness in extremities, foot ulcers Lifestyle modifications:pt is not doing any regular activity. Pt does not want to start a true exercise program.  Pt states she has some cough for about 1 month now and states it is midl productive, white in color, usually occurs when she at home at night, no t positional, maybe a little trouble breathing, still smoking not using her inhaler. Does wear a CPAP at night.   Obesity, watches what she eats because she does not have enough money, pt also states she does not want to exercise.      Habits & Providers  Alcohol-Tobacco-Diet     Tobacco Status: current     Tobacco Counseling: to quit use of tobacco products     Cigarette Packs/Day: 0.5     Year Started: 1972     Pack years: 19.50     Passive Smoke Exposure: yes  Current Medications (verified): 1)  Allopurinol 300 Mg Tabs (Allopurinol) .... One By Mouth Daily To Prevent Gout 2)  Bayer  Childrens Aspirin 81 Mg Chew (Aspirin) .... Take 1 Tablet By Mouth Once A Day 3)  Claritin 10 Mg Tabs (Loratadine) .... Take 1 Tablet By Mouth Once A Day 4)  Glucotrol Xl 10 Mg Tb24 (Glipizide) .... Take 2 Tablet By Mouth Once A Day 5)  Hydrochlorothiazide 25 Mg Tabs (Hydrochlorothiazide) .... Take 1 Tablet By Mouth Once A Day 6)  Dicyclomine Hcl 20 Mg Tabs (Dicyclomine Hcl) .... One By Mouth Four Times Daily 7)  Flonase 50 Mcg/act  Susp (Fluticasone Propionate) .[name].. 1 Spray Each Nostril Daily 8)  Sertraline Hcl 100 Mg  Tabs (Sertraline Hcl) .[name].. 1 Tab  By Mouth Once Daily 9)  Atenolol 50 Mg  Tabs (Atenolol) .... One By Mouth Daily 10)  Enalapril Maleate 20 Mg  Tabs (Enalapril Maleate) .... One Tablet By Mouth Twice A Day 11)  Amlodipine Besylate 10 Mg  Tabs (Amlodipine Besylate) .... One By Mouth Daily 12)  Nitrostat 0.4 Mg Subl (Nitroglycerin) .... One By Mouth Sublingually As Needed For Chest Pain 13)  Lipitor 10 Mg Tabs (Atorvastatin Calcium) .... One By Mouth Daily For Cholesterol 14)  Actos 30 Mg Tabs (Pioglitazone Hcl) .[name].. 1 Tab Once Daily 15)  Cyclobenzaprine Hcl 5 Mg Tabs (Cyclobenzaprine Hcl) .[name].. 1 Tab Once Daily As Needed 16)  Trazodone Hcl 50 Mg Tabs (Trazodone Hcl) .[name].. 1 Tab Once Daily 17)  Colcrys 0.6 Mg Tabs (Colchicine) .[name].. 1 Tab Once Daily 18)  Ventolin Hfa 108 (90 Base) Mcg/act Aers (Albuterol Sulfate) .... 2 Puffs Q4 Hours As Needed For [employer]. 19)  Tussionex Pennkinetic Er 10-8 Mg/1m Lqcr (Hydrocod Polst-Chlorphen Polst) ..[name]. 1 Tsp Two Times A Day As Needed For Cough  Allergies (verified): 1)  ! * Metformin  Past History:  Past medical, surgical, family and social histories (including risk factors) reviewed, and no changes noted (except as noted below).  Past Medical History: Reviewed history from 08/02/2009 and no changes required. Current Problems:  HYPERTENSION, BENIGN SYSTEMIC (ICD-401.1) CHEST PAIN (ICD-786.50) SLEEP APNEA (ICD-780.57) HYPERTENSION  (ICD-401.9) DYSLIPIDEMIA (ICD-272.4) OBESITY, UNSPECIFIED (ICD-278.00) OBSTRUCTIVE SLEEP APNEA (ICD-327.23) - on CPAP per Dr. CGwenette GreetRESTRICTIVE LUNG DISEASE (ICD-518.89) DYSPNEA/SHORTNESS OF BREATH (ICD-786.09) HIP PAIN, BILATERAL (ICD-719.45) BENIGN POSITIONAL VERTIGO (ICD-386.11) SCIATICA, RIGHT (ICD-724.3) VACCINE AGAINST INFLUENZA (ICD-V04.81) GOUT NOS (ICD-274.9) SEBORRHEIC KERATOSIS (ICD-702.19) TOBACCO DEPENDENCE (ICD-305.1) RHINITIS, ALLERGIC (ICD-477.9) OSTEOARTHRITIS, MULTI SITES (ICD-715.98) MENOPAUSAL SYNDROME (ICD-627.2) IRRITABLE BOWEL SYNDROME (ICD-564.1) DIABETES MELLITUS II, UNCOMPLICATED (IKDT-267.12 DEPRESSION, MAJOR, RECURRENT (ICD-296.30) ANXIETY (ICD-300.00) mild aortic regurg, mild MR.         Past Surgical History: Reviewed history from 04/27/2009 and no changes required. Hysterectomy--partial -, L rotator cuff repair 11/07-[facility] - 03/14/2006,  R shoulder surgery w/ Dr. KJenean Lindau-,  tonsillectomy - B feet surgery  echo 10/10: mild pulm HTN, EF 60-65%, mild LVH, moderate aortic regurg  Family History: Reviewed history from 04/27/2009 and no changes required. heart disease: mother (M.I.) rheumatism: mother cancer: mother (breast), sister (pancreatic)  Mother: HTN, DM<  sister: Dm  Social History: Reviewed history from 04/27/2009 and no changes required. Divorced '97 and now single.   Lives alone,  pt has 4 children. smokes 1/2 ppd.  started at age 89 No etoh.  Currently disabled.  Previously worked as a [employer] manager  Has trouble affording medications. Recently moved to 1st floor apartment housing (end of June 2010). Likes it there.  Grandson recently sent to jail - (38 - 42 months) -- this is stressful for the patient.Packs/Day:  0.5  Review of Systems       see hpi  Physical Exam  General:  Well-developed,well-nourished,in no acute distress; alert,appropriate and cooperative throughout examination.  Vitals reviewed. Eyes:  pupils equal,  pupils round, pupils reactive to light, and pupils react to accomodation.   Mouth:  Oral mucosa and oropharynx without lesions or exudates.   Neck:  supple, full ROM, and no masses.   Lungs:  Normal respiratory effort, chest expands symmetrically.Mild weeze heard throughout and coarse breath sounds.  Heart:  Normal rate and regular rhythm. S1 and S2 normal without gallop, murmur, click, rub or other extra sounds. Abdomen:  soft, non-tender, normal bowel sounds, and no distention.   Pulses:  R and L carotid,dorsalis pedis and posterior are full and equal bilaterally Extremities:  no edema Neurologic:  alert & oriented X3 CN 2-12 intact   Impression & Recommendations:  Problem # 1:  HYPERTENSION, BENIGN SYSTEMIC (ICD-401.1) Pt is at goal doing well, labs just checked in novemeber will wait until next visit to check again.  Her updated medication list for this problem includes:    Hydrochlorothiazide 25 Mg Tabs (Hydrochlorothiazide) ..[name].. Take 1 tablet by mouth once a day    Atenolol 50 Mg Tabs (Atenolol) ..... One by mouth daily    Enalapril Maleate 20 Mg Tabs (Enalapril maleate) ..... One tablet by  mouth twice a day    Amlodipine Besylate 10 Mg Tabs (Amlodipine besylate) ..... One by mouth daily  Orders: [facility]- Est  Level 4 ([address]509)  Problem # 2:  DYSLIPIDEMIA (ICD-272.4) get FLP to see how pt is doing. May need to increase lipitor to 70m at bedtime to bring down LDL to goal  The following medications were removed from the medication list:    Lipitor 10 Mg Tabs (Atorvastatin calcium) ..... One by mouth daily for cholesterol Her updated medication list for this problem includes:    Atorvastatin Calcium 20 Mg Tabs (Atorvastatin calcium) ..[name].. 1 tablet at bedtime  Orders: Lipid-[facility] (([id number] [facility] Est  Level 4 (([address]  Problem # 3:  TOBACCO DEPENDENCE (ICD-305.1) Pt has quit ate of 05/11/09.  We see how pt is doing at next appointment.  Orders: [facility]  Level 4 (([address]  Problem #  4:  OBESITY, UNSPECIFIED (ICD-278.00) encouraged weight loss pt once again states not going to do much exercise but will attempt to watch what she eats.   Complete Medication List: 1)  Allopurinol 300 Mg Tabs (Allopurinol) .... One by mouth daily to prevent gout 2)  Bayer Childrens Aspirin 81 Mg Chew (Aspirin) .... Take 1 tablet by mouth once a day 3)  Claritin 10 Mg Tabs (Loratadine) .... Take 1 tablet by mouth once a day 4)  Glucotrol Xl 10 Mg Tb24 (Glipizide) .... Take 2 tablet by mouth once a day 5)  Hydrochlorothiazide 25 Mg Tabs (Hydrochlorothiazide) .... Take 1 tablet by mouth once a day 6)  Dicyclomine Hcl 20 Mg Tabs (Dicyclomine hcl) .... One by mouth four times daily 7)  Flonase 50 Mcg/act Susp (Fluticasone propionate) ..[name]. 1 spray each nostril daily 8)  Sertraline Hcl 100 Mg Tabs (Sertraline hcl) ..[name]. 1 tab  by mouth once daily 9)  Atenolol 50 Mg Tabs (Atenolol) .... One by mouth daily 10)  Enalapril Maleate 20 Mg Tabs (Enalapril maleate) .... One tablet by mouth twice a day 11)  Amlodipine Besylate 10 Mg Tabs (Amlodipine besylate) .... One by mouth daily 12)  Nitrostat 0.4 Mg Subl (Nitroglycerin) .... One by mouth sublingually as needed for chest pain 13)  Actos 30 Mg Tabs (Pioglitazone hcl) ..[name]. 1 tab once daily 14)  Cyclobenzaprine Hcl 5 Mg Tabs (Cyclobenzaprine hcl) ..[name]. 1 tab once daily as needed 15)  Trazodone Hcl 50 Mg Tabs (Trazodone hcl) ..[name]. 1 tab once daily 16)  Colcrys 0.6 Mg Tabs (Colchicine) ..[name]. 1 tab once daily 17)  Ventolin Hfa 108 (90 Base) Mcg/act Aers (Albuterol sulfate) .... 2 puffs q4 hours as needed for weezing. 18)  Tussionex Pennkinetic Er 10-8 Mg/541mLqcr (Hydrocod polst-chlorphen polst) ...[name] 1 tsp two times a day as needed for cough 19)  Atorvastatin Calcium 20 Mg Tabs (Atorvastatin calcium) ...[name] 1 tablet at bedtime  Other Orders: A1C-[facility] ([phone] Patient Instructions: 1)  good to see you 2)  I will refill all your meds 3)  We will get some labs and  will call you with the results 4)  I need to see you again in 3 months.  Prescriptions: NITROSTAT 0.4 MG SUBL (NITROGLYCERIN) one by mouth sublingually as needed for chest pain  #30 x 1   Entered and Authorized by:   ZaHulan SaasO   Signed by:   ZaHulan SaasO on 05/04/2010   Method used:   Faxed to ...       [facility]       [phone]  [address] [place], [place]  [address]       Ph: [phone]       Fax: [phone]   RxID:   [id number] CYCLOBENZAPRINE HCL 5 MG TABS (CYCLOBENZAPRINE HCL) 1 tab once daily as needed  #90 x 1   Entered and Authorized by:   Hulan Saas DO   Signed by:   Hulan Saas DO on 05/04/2010   Method used:   Faxed to ...       [facility] (retail)       [address] [place], [place]  [address]       Ph: [phone]       Fax: [phone]   RxID:   [id number] ACTOS 30 MG TABS (PIOGLITAZONE HCL) 1 tab once daily  #90 x 3   Entered and Authorized by:   Hulan Saas DO   Signed by:   Hulan Saas DO on 05/04/2010   Method used:   Faxed to ...       [facility] (retail)       [address] [place], [place]  [address]       Ph: [phone]       Fax: [phone]   RxID:   [id number] AMLODIPINE BESYLATE 10 MG  TABS (AMLODIPINE BESYLATE) one by mouth daily  #90 x 3   Entered and Authorized by:   Hulan Saas DO   Signed by:   Hulan Saas DO on 05/04/2010   Method used:   Faxed to ...       [facility] (retail)       [address] [place], [place]  [address]       Ph: [phone]       Fax: [phone]   RxID:   [id number] ENALAPRIL MALEATE 20 MG  TABS (ENALAPRIL MALEATE) one tablet by mouth twice a day  #180 x 3   Entered and Authorized by:   Hulan Saas DO   Signed by:   Hulan Saas DO on 05/04/2010   Method used:   Faxed to ...       [facility] (retail)       [address] [place],  [place]  [address]       Ph: [phone]       Fax: [phone]   RxID:   [id number] ATENOLOL 50 MG  TABS (ATENOLOL) one by mouth daily  #90 x 3   Entered and Authorized by:   Hulan Saas DO   Signed by:   Hulan Saas DO on 05/04/2010   Method used:   Faxed to ...       [facility] (retail)       [address] [place], [place]  [address]       Ph: [phone]       Fax: [phone]   RxID:   [id number] FLONASE 50 MCG/ACT  SUSP (FLUTICASONE PROPIONATE) 1 spray each nostril daily  #3 x 1   Entered and Authorized by:   Hulan Saas DO   Signed by:   Hulan Saas DO on 05/04/2010   Method used:   Faxed to ...       [facility] (retail)  [address] [place], [place]  [address]       Ph: [phone]       Fax: [phone]   RxID:   [id number] DICYCLOMINE HCL 20 MG TABS (DICYCLOMINE HCL) one by mouth four times daily  #360 x 1   Entered and Authorized by:   Hulan Saas DO   Signed by:   Hulan Saas DO on 05/04/2010   Method used:   Faxed to ...       [facility] (retail)       [address] [place], [place]  [address]       Ph: [phone]       Fax: [phone]   RxID:   [id number] HYDROCHLOROTHIAZIDE 25 MG TABS (HYDROCHLOROTHIAZIDE) Take 1 tablet by mouth once a day  #90 x 3   Entered and Authorized by:   Hulan Saas DO   Signed by:   Hulan Saas DO on 05/04/2010   Method used:   Faxed to ...       [facility] (retail)       [address] [place], [place]  [address]       Ph: [phone]       Fax: [phone]   RxID:   [id number] GLUCOTROL XL 10 MG TB24 (GLIPIZIDE) Take 2 tablet by mouth once a day  #180 x 3   Entered and Authorized by:   Hulan Saas DO   Signed by:   Hulan Saas DO on 05/04/2010   Method used:   Faxed to ...       [facility] (retail)       [address] [place], [place]  [address]        Ph: [phone]       Fax: [phone]   RxID:   [id number] SERTRALINE HCL 100 MG  TABS (SERTRALINE HCL) 1 tab  by mouth once daily  #90 x 3   Entered and Authorized by:   Hulan Saas DO   Signed by:   Hulan Saas DO on 05/04/2010   Method used:   Faxed to ...       [facility] (retail)       [address] [place], [place]  [address]       Ph: [phone]       Fax: [phone]   RxID:   [id number] CLARITIN 10 MG TABS (LORATADINE) Take 1 tablet by mouth once a day  #90 x 3   Entered and Authorized by:   Hulan Saas DO   Signed by:   Hulan Saas DO on 05/04/2010   Method used:   Faxed to ...       [facility] (retail)       [address] [place], [place]  [address]       Ph: [phone]       Fax: [phone]   RxID:   [id number] ALLOPURINOL 300 MG TABS (ALLOPURINOL) one by mouth daily to prevent gout  #90 x 3   Entered and Authorized by:   Hulan Saas DO   Signed by:   Hulan Saas DO on 05/04/2010   Method used:   Faxed to [facility] (retail)       [address] [place], [place]  [address]       Ph: [phone]       Fax: [phone]   RxID:   [id number] ATORVASTATIN CALCIUM 20 MG TABS (ATORVASTATIN CALCIUM) 1 tablet at bedtime  #90 x 3   Entered and Authorized by:   Hulan Saas DO   Signed by:   Hulan Saas DO on 05/04/2010   Method used:   Faxed to ...       [facility] (retail)       [address] [place], [place]  [address]       Ph: [phone]       Fax: [phone]   RxID:   [id number] Cathie Hoops ER 10-8 MG/5ML LQCR (HYDROCOD POLST-CHLORPHEN POLST) 1 tsp two times a day as needed for cough  #176m x 0   Entered and Authorized by:   ZHulan SaasDO   Signed by:   ZHulan SaasDO on 05/04/2010   Method used:   Print then Give to Patient   RxID:   [id number] HFA 108 (90 BASE) MCG/ACT  AERS (ALBUTEROL SULFATE) 2 puffs q4 hours as needed for weezing.  #3 x 1   Entered and Authorized by:   ZHulan SaasDO   Signed by:   ZHulan SaasDO on 05/04/2010   Method used:   Faxed to ...       [facility]       [address]  [address]      Ph: [phone]      Fax: [phone]  RxID:   [id number]   Orders Added: 1)  Lipid-[facility] [[address]30] 2)  A1C-[facility] [83036] 3)  [facility]  Level 4 [[66599]   Prevention & Chronic Care Immunizations   Influenza vaccine: Fluvax 3+  (02/14/2010)   Influenza vaccine due: 12/27/2008    Tetanus booster: 06/06/2004: Done.   Tetanus booster due: 06/07/2014    Pneumococcal vaccine: Pneumovax  (02/14/2010)   Pneumococcal vaccine due: None    H. zoster vaccine: 03/08/2008: .  Colorectal Screening   Hemoccult: Done.  (09/07/2003)   Hemoccult due: Not Indicated    Colonoscopy: normal  (04/19/2004)   Colonoscopy due: 04/19/2014  Other Screening   Pap smear: normal  (06/23/2007)   Pap smear action/deferral: Deferred  (02/14/2010)   Pap smear due: 06/23/2010    Mammogram: ASSESSMENT: Negative - BI-RADS 1^MM DIGITAL SCREENING  (01/01/2010)   Mammogram due: 12/21/2008    DXA bone density scan: Not documented   Smoking status: current  (05/04/2010)   Smoking cessation counseling: yes  (12/28/2007)   Target quit date: 05/23/2009  (05/02/2009)  Diabetes Mellitus   HgbA1C: 6.6  (02/14/2010)   Hemoglobin A1C due: 01/20/2008    Eye exam: normal  (11/14/2008)   Diabetic eye exam action/deferral: Not indicated  (02/14/2010)   Eye exam due: 11/15/2008    Foot exam: yes  (01/04/2009)   Foot exam action/deferral: Do today   High risk foot: Not documented   Foot care education: Not documented   Foot exam due: 12/27/2008    Urine microalbumin/creatinine ratio: Not documented   Urine microalbumin/cr due: 11/26/2008    Diabetes flowsheet reviewed?: Yes   Progress toward A1C  goal: At goal  Stage of readiness to change (diabetes management): Maintenance  Lipids   Total Cholesterol: 160  (03/13/2009)   LDL: See Comment mg/dL  (10/20/2007)   LDL Direct: 80  (02/14/2010)   HDL: 26.90  (03/13/2009)   Triglycerides: 359.0  (03/13/2009)    SGOT (AST): 17  (02/14/2010)   SGPT (ALT): 14  (02/14/2010)   Alkaline phosphatase: 125  (02/14/2010)   Total bilirubin: 0.3  (02/14/2010)    Lipid flowsheet reviewed?: Yes   Progress toward LDL goal: Unchanged    Stage of readiness to change (lipid management): Maintenance  Hypertension   Last Blood Pressure: 115 / 60  (05/04/2010)   Serum creatinine: 0.76  (02/14/2010)   Serum potassium 4.3  (02/14/2010)    Hypertension flowsheet reviewed?: Yes   Progress toward BP goal: At goal    Stage of readiness to change (hypertension management): Maintenance  Self-Management Support :   Personal Goals (by the next clinic visit) :     Personal A1C goal: 7  (01/04/2009)     Personal blood pressure goal: 130/80  (01/04/2009)     Personal LDL goal: 70  (01/04/2009)    Diabetes self-management support: Written self-care plan  (06/12/2009)    Diabetes self-management support not done because: Good outcomes  (04/26/2009)    Hypertension self-management support: Written self-care plan  (06/12/2009)    Hypertension self-management support not done because: Good outcomes  (06/12/2009)    Lipid self-management support: Written self-care plan  (06/12/2009)     Lipid self-management support not done because: Good outcomes  (06/12/2009)  Appended Document: A1c  7.0 %    Lab Visit  Laboratory Results   Blood Tests   Date/Time Received: May 04, 2010 11:30 AM  Date/Time Reported: May 04, 2010 1:58 PM   HGBA1C: 7.0%   (Normal Range: Non-Diabetic - 3-6%   Control Diabetic - 6-8%)  Comments: ...............test performed by......Marland KitchenBonnie A. Martinique, MLS (ASCP)cm    Orders Today:

## 2010-05-28 ENCOUNTER — Encounter: Payer: Self-pay | Admitting: Family Medicine

## 2010-05-28 ENCOUNTER — Emergency Department (HOSPITAL_COMMUNITY)
Admission: EM | Admit: 2010-05-28 | Discharge: 2010-05-29 | Disposition: A | Discharge status: Other Acute Inpatient Hospital | Payer: No Typology Code available for payment source | Attending: Emergency Medicine | Admitting: Emergency Medicine

## 2010-05-28 ENCOUNTER — Emergency Department (HOSPITAL_COMMUNITY): Payer: No Typology Code available for payment source

## 2010-05-28 DIAGNOSIS — E119 Type 2 diabetes mellitus without complications: Secondary | ICD-10-CM | POA: Insufficient documentation

## 2010-05-28 DIAGNOSIS — I10 Essential (primary) hypertension: Secondary | ICD-10-CM | POA: Insufficient documentation

## 2010-05-28 DIAGNOSIS — N289 Disorder of kidney and ureter, unspecified: Secondary | ICD-10-CM | POA: Insufficient documentation

## 2010-05-28 DIAGNOSIS — E860 Dehydration: Secondary | ICD-10-CM | POA: Insufficient documentation

## 2010-05-28 DIAGNOSIS — J40 Bronchitis, not specified as acute or chronic: Secondary | ICD-10-CM | POA: Insufficient documentation

## 2010-05-28 LAB — BASIC METABOLIC PANEL
BUN: 31 mg/dL — ABNORMAL HIGH (ref 6–23)
CO2: 27 mEq/L (ref 19–32)
Calcium: 9 mg/dL (ref 8.4–10.5)
Chloride: 98 mEq/L (ref 96–112)
Creatinine, Ser: 2.33 mg/dL — ABNORMAL HIGH (ref 0.4–1.2)
GFR calc Af Amer: 26 mL/min — ABNORMAL LOW (ref >60)
GFR calc non Af Amer: 21 mL/min — ABNORMAL LOW (ref >60)
Glucose, Bld: 98 mg/dL (ref 70–99)
Potassium: 3.8 mEq/L (ref 3.5–5.1)
Sodium: 135 mEq/L (ref 135–145)

## 2010-05-28 LAB — DIFFERENTIAL
Basophils Absolute: 0.2 10*3/uL — ABNORMAL HIGH (ref 0.0–0.1)
Basophils Relative: 2 % — ABNORMAL HIGH (ref 0–1)
Eosinophils Absolute: 0 10*3/uL (ref 0.0–0.7)
Eosinophils Relative: 0 % (ref 0–5)
Lymphocytes Relative: 25 % (ref 12–46)
Lymphs Abs: 2.3 10*3/uL (ref 0.7–4.0)
Monocytes Absolute: 0.8 10*3/uL (ref 0.1–1.0)
Monocytes Relative: 9 % (ref 3–12)
Neutro Abs: 5.9 10*3/uL (ref 1.7–7.7)
Neutrophils Relative %: 64 % (ref 43–77)
WBC Morphology: INCREASED

## 2010-05-28 LAB — URINALYSIS, ROUTINE W REFLEX MICROSCOPIC
Nitrite: NEGATIVE
Protein, ur: >300 mg/dL — AB
Specific Gravity, Urine: 1.029 (ref 1.005–1.030)
Urine Glucose, Fasting: NEGATIVE mg/dL
Urobilinogen, UA: 1 mg/dL (ref 0.0–1.0)
pH: 5 (ref 5.0–8.0)

## 2010-05-28 LAB — BLOOD GAS, ARTERIAL
Acid-Base Excess: 2.4 mmol/L — ABNORMAL HIGH (ref 0.0–2.0)
Bicarbonate: 26.3 mEq/L — ABNORMAL HIGH (ref 20.0–24.0)
Drawn by: 326301
FIO2: 0.28 %
O2 Saturation: 93.2 %
Patient temperature: 98.6
TCO2: 23.5 mmol/L (ref 0–100)
pCO2 arterial: 40 mmHg (ref 35.0–45.0)
pH, Arterial: 7.434 — ABNORMAL HIGH (ref 7.350–7.400)
pO2, Arterial: 64.9 mmHg — ABNORMAL LOW (ref 80.0–100.0)

## 2010-05-28 LAB — CBC
HCT: 39.7 % (ref 36.0–46.0)
Hemoglobin: 12.7 g/dL (ref 12.0–15.0)
MCH: 27.9 pg (ref 26.0–34.0)
MCHC: 32 g/dL (ref 30.0–36.0)
MCV: 87.1 fL (ref 78.0–100.0)
Platelets: 160 10*3/uL (ref 150–400)
RBC: 4.56 MIL/uL (ref 3.87–5.11)
RDW: 14.6 % (ref 11.5–15.5)
WBC: 9.2 10*3/uL (ref 4.0–10.5)

## 2010-05-28 LAB — CK TOTAL AND CKMB (NOT AT ARMC)
CK, MB: 1.8 ng/mL (ref 0.3–4.0)
Relative Index: 0.2 (ref 0.0–2.5)
Total CK: 967 U/L — ABNORMAL HIGH (ref 7–177)

## 2010-05-28 LAB — URINE MICROSCOPIC-ADD ON

## 2010-05-28 LAB — GLUCOSE, CAPILLARY: Glucose-Capillary: 125 mg/dL — ABNORMAL HIGH (ref 70–99)

## 2010-05-28 LAB — TROPONIN I: Troponin I: 0.03 ng/mL (ref 0.00–0.06)

## 2010-05-28 LAB — BRAIN NATRIURETIC PEPTIDE: Pro B Natriuretic peptide (BNP): 39.3 pg/mL (ref 0.0–100.0)

## 2010-05-28 NOTE — H&P (Unsigned)
Hospital Admission Note Date: 05/28/2010  Patient name: Bonnie Peterson Medical record number: [id number] Date of birth: 12-01-1945 Age: 65 y.o. Gender: female PCP: Antoine Primas, DO, DO  Medical Service: FMTS   Attending physician:  Chambliss    Pager: Resident (R2/R3): Caviness      Pager: Resident (R1): Benn Tarver      Pager: [phone]  Chief Complaint: SOB and dizziness   History of Present Illness: Pt is a 65 yo F with known history of restrictive lung disease who reports a 5 day history of worsening SOB associated with a productive cough and generalized malaise followed by a three day history of weakness and dizziness. She states that her symptoms started last Thursday after she had been taking care of her great-grandson for 5 days who also had cough and runny nose. She states that her cough was productive of brown sputum, no blood. She also reports feeling "sick" which she describes as nausea, no appetite (last ate and drank Thursday), feeling weak, increased fatigue, chills and subjective fever. She has had diarrhea x 2 episode daily since Thursday. She started feeling weak and light headed Saturday night and had an episode of syncope will sitting on the toilet early Sunday morning. She denies head trauma. She also denies vomiting. She admits to one episode of CP while in the ED, right sided, non-radiating, lasting 2 minutes. She denies palpitation, loss of change in vision, ringing in ears, tingling or numbness in extremities.    When asked about urinary symptoms the patient endorse frequency and incontinence since Thursday. She denies dysuria.  [facility] ED course: Atrovent neb x 1, Albuterol neb x 1, Solumedrol 125 mg IV x 1, 500 ml bolus NS.   Current Outpatient Prescriptions  Medication Sig Dispense Refill  . allopurinol (ZYLOPRIM) 300 MG tablet Take 300 mg by mouth daily.        [name] amLODipine (NORVASC) 10 MG tablet Take 10 mg by mouth daily.        [name] aspirin 81 MG chewable tablet  Chew 81 mg by mouth daily.        [name] atenolol (TENORMIN) 50 MG tablet Take 50 mg by mouth daily.        [name] atorvastatin (LIPITOR) 10 MG tablet Take 10 mg by mouth daily.        . colchicine 0.6 MG tablet Take 0.6 mg by mouth daily.        . cyclobenzaprine (FLEXERIL) 5 MG tablet Take 5 mg by mouth daily as needed.        . dicyclomine (BENTYL) 20 MG tablet Take 20 mg by mouth 4 (four) times daily.        . enalapril (VASOTEC) 20 MG tablet Take 20 mg by mouth 2 (two) times daily.        . fluticasone (FLONASE) 50 MCG/ACT nasal spray 1 spray by Nasal route daily.        [name] glipiZIDE (GLUCOTROL) 10 MG tablet Take 2 tablets by mouth once a day       . hydrochlorothiazide 25 MG tablet Take 25 mg by mouth daily.        [name] loratadine (CLARITIN) 10 MG tablet Take 10 mg by mouth daily.        . nitroGLYCERIN (NITROSTAT) 0.4 MG SL tablet Place 0.4 mg under the tongue every 5 (five) minutes as needed.        . pioglitazone (ACTOS) 30 MG tablet Take 30 mg by mouth daily.        [name]  sertraline (ZOLOFT) 100 MG tablet Take 100 mg by mouth daily.        . traZODone (DESYREL) 50 MG tablet Take 50 mg by mouth at bedtime.          Allergies: Metformin  Code status: DNR/DNI   Past Medical History  Diagnosis Date  . Obstructive sleep apnea   . HTN, goal below 130/80   . HLD (hyperlipidemia)   . Benign positional vertigo   . Depression   . Anxiety   . Osteoarthritis (arthritis due to wear and tear of joints)   . Irritable bowel syndrome   . Obesity, Class III, BMI 40-49.9 (morbid obesity)   . Gout   . Restrictive lung disease   . Echocardiogram findings abnormal, without diagnosis 10/10    10/10: mild pulm HTN, EF 60-65%, mild LVH, moderate aortic regurg   Past Surgical History  Procedure Date  . Partial hysterectomy   . Rotator cuff repair       L rotator cuff repair 11/07-[facility] - 12/7/200  . Tonsillectomy     Family History  Problem Relation Age of Onset  . Breast cancer Mother   .  Hypertension Mother   . Coronary artery disease Mother   . Diabetes type II Sister   . Pancreatic cancer Sister   . Diabetes type II Mother    History   Social History  . Marital Status: Single    Spouse Name: N/A    Number of Children: 4  . Years of Education: N/A   Occupational History  . unemployed    Social History Main Topics  . Smoking status: Former Smoker -- 0.5 packs/day for 40 years    Types: Cigarettes    Quit date: 05/15/2010  . Smokeless tobacco: Never Used   Comment: Pt using 21 mg nicotine patch.   . Alcohol Use: No  . Drug Use: No  . Sexually Active: Not on file   Other Topics Concern  . Not on file   Social History Narrative  . No narrative on file    Review of Systems: Pertinent items are noted in HPI.  Physical Exam: Vitals: T 98.5, BP 99/63 --> 93/56 --> 97/60--> 101/58, HR 76, RR 22, 93% on RA GEN: Awake, NAD, appropriate to exam  HEENT:PERRLA, dry mucus membranes. Oropharynx without erythema or edema.  No cervical lymphadenopathy.  CV: S1S2 RRR, no MRG Resp: nml WOB, poor aeration b/l, few scattered high pitch inspiratory wheezing. No crackles.  XBJ:YNWG, soft, non tender, no masses.  Ext: no edema, dry skin MSK:5/5 strength in b/l UE and LE.  Neuro: A&O x 3 , CN II-XII grossly intact, strength and sensation in tact b/l.   Lab results: ABG:   7.434/40/64.9/26.3/93.2/2.4    Chemistry      Component Value Date/Time   NA 135 05/28/2010 2045   K 3.8 05/28/2010 2045   CL 98 05/28/2010 2045   CO2 27 05/28/2010 2045   BUN 31* 05/28/2010 2045   CREATININE 2.33* 05/28/2010 2045   Glucose: 98   Lab Results  Component Value Date   WBC 9.2 05/28/2010   HGB 12.7 05/28/2010   HCT 39.7 05/28/2010   MCV 87.1 05/28/2010   PLT 160 05/28/2010    BNP 39.3 CE: Total CK 967, Trop 0.03  UA: Amber, turbid, sp grav 1.029, neg glucose, moderate blood, >300 protein, neg nitrite, moderate leukocyte U micro: many squamous, 21-50 WBC, 7-10 RBC, many bacteria.    UCx: pending  Imaging results:  CXR: Stable cardiomegaly with pulmonary vascular congestion and mild   bibasilar edema.  Assessment & Plan by Problem: 64 yo F with SOB and dizziness.  1. SOB: likely secondary to viral exacerbation of baseline pulmonary HTN/COPD. She is not edematous on exam, she does not have an elevated BNP so therefore does not appear to be in right heart failure. Will treat with supplemental O2 as need to maintain O2 sats > 92. Continue q2/q4 albuterol and atrovent  Nebs, to be spaced as needed. Continue steroids- oral prednisone 60 mg Po q daily. Will cycle CE to be sure SOB is non-cardiac, anticipate nml CE except for elevated total CK. Add CPAP at night for OSA.   2. Dizziness- likely pre-syncope secondary to hypovolemia. This is supported by history of diarrhea, hypotension, evidence of dehydration on UA, elevated Cr.  Also, per EDP report pt was too weak to stand in ED to obtain orthostatic vital signs. Have also considered acute neurological cause, but symptoms were not sudden in onset and still persist. Also there is are no other focal neurological deficits. Pt has history of BPV that resolved when she moved to the ground floor from the 12 th floor apartment.  Plan to hydrate with NS + 20 meq KCL, give another 1.5 L, then KVO. Pt asking for food and drink. If she is unable to tolerate PO intake will restart fluids. If symptoms persist despite hydration will obtain imaging of head.   3. Diarrhea- likely viral in etiology. No history of recent antibiotic/hospitalization course. Decreasing in amount.  Plan for IVF hydration. Will obtain C.diff PCR is persistent/increased.   4. Probable UTI- based on symptoms and UA (U micro does not appear to be a clean catch) will treat pending urine culture.   5. Chest pain- transient in ED. R sided. Likely anxiety. Will cycle CE.   6. Acute renal insufficiency- likely multifactorial secondary to dehydration and infection . Hold ACE.  IV hydration. CTX. Repeat BMET to monitor for improvement.   7. Weakness- pt acutely deconditioned from illness. Elevated total CK suggest that pt has been mostly bed bound for last few days. Will obtain PT consult to asses for therapy/home health needs.   8. H/o HTN: hold all Anti-HTN except amlodipine which I will continue since pt has baseline pulm HTN.   9. H/o HLD- continue lipitor.   10. NIDDM- Well controlled. Last A1c 6.6 02/2010. Sensitive sliding scale.   11. H/o tobacco- 21 mg nicotine patch TD q D.   12. H/o  Depression- Mood stable. Will continue. home dose of Zoloft.  13. Insomnia-home dose of trazadone.   14. FEN/GI: heart healthy diet.   15. DVT prophylaxis: Lovenox 40 [employer] q daily.   16. Dispo: Pt will likely go home possibly with short term home health RN/PT in next 1-2 days.

## 2010-05-29 ENCOUNTER — Encounter: Payer: Self-pay | Admitting: Family Medicine

## 2010-05-29 ENCOUNTER — Inpatient Hospital Stay (HOSPITAL_COMMUNITY)
Admission: EM | Admit: 2010-05-29 | Discharge: 2010-05-30 | DRG: 191 | Disposition: A | Discharge status: Home / Self Care | Payer: No Typology Code available for payment source | Source: Other Acute Inpatient Hospital | Attending: Family Medicine | Admitting: Family Medicine

## 2010-05-29 DIAGNOSIS — J441 Chronic obstructive pulmonary disease with (acute) exacerbation: Secondary | ICD-10-CM

## 2010-05-29 DIAGNOSIS — G4733 Obstructive sleep apnea (adult) (pediatric): Secondary | ICD-10-CM | POA: Diagnosis present

## 2010-05-29 DIAGNOSIS — Z7982 Long term (current) use of aspirin: Secondary | ICD-10-CM

## 2010-05-29 DIAGNOSIS — E785 Hyperlipidemia, unspecified: Secondary | ICD-10-CM | POA: Diagnosis present

## 2010-05-29 DIAGNOSIS — R197 Diarrhea, unspecified: Secondary | ICD-10-CM | POA: Diagnosis present

## 2010-05-29 DIAGNOSIS — E119 Type 2 diabetes mellitus without complications: Secondary | ICD-10-CM | POA: Diagnosis present

## 2010-05-29 DIAGNOSIS — N390 Urinary tract infection, site not specified: Secondary | ICD-10-CM

## 2010-05-29 DIAGNOSIS — E860 Dehydration: Secondary | ICD-10-CM

## 2010-05-29 DIAGNOSIS — G4700 Insomnia, unspecified: Secondary | ICD-10-CM | POA: Diagnosis present

## 2010-05-29 DIAGNOSIS — N289 Disorder of kidney and ureter, unspecified: Secondary | ICD-10-CM | POA: Diagnosis present

## 2010-05-29 LAB — GLUCOSE, CAPILLARY
Glucose-Capillary: 248 mg/dL — ABNORMAL HIGH (ref 70–99)
Glucose-Capillary: 233 mg/dL — ABNORMAL HIGH (ref 70–99)
Glucose-Capillary: 339 mg/dL — ABNORMAL HIGH (ref 70–99)
Glucose-Capillary: 312 mg/dL — ABNORMAL HIGH (ref 70–99)
Glucose-Capillary: 227 mg/dL — ABNORMAL HIGH (ref 70–99)

## 2010-05-29 LAB — CBC
HCT: 35.8 % — ABNORMAL LOW (ref 36.0–46.0)
Hemoglobin: 11.7 g/dL — ABNORMAL LOW (ref 12.0–15.0)
MCH: 27.8 pg (ref 26.0–34.0)
MCHC: 32.7 g/dL (ref 30.0–36.0)
MCV: 85 fL (ref 78.0–100.0)
Platelets: 169 10*3/uL (ref 150–400)
RBC: 4.21 MIL/uL (ref 3.87–5.11)
RDW: 14.7 % (ref 11.5–15.5)
WBC: 6.9 10*3/uL (ref 4.0–10.5)

## 2010-05-29 LAB — BASIC METABOLIC PANEL
BUN: 40 mg/dL — ABNORMAL HIGH (ref 6–23)
CO2: 27 mEq/L (ref 19–32)
Calcium: 8.8 mg/dL (ref 8.4–10.5)
Chloride: 95 mEq/L — ABNORMAL LOW (ref 96–112)
Creatinine, Ser: 1.85 mg/dL — ABNORMAL HIGH (ref 0.4–1.2)
GFR calc Af Amer: 33 mL/min — ABNORMAL LOW (ref >60)
GFR calc non Af Amer: 28 mL/min — ABNORMAL LOW (ref >60)
Glucose, Bld: 243 mg/dL — ABNORMAL HIGH (ref 70–99)
Potassium: 4.1 mEq/L (ref 3.5–5.1)
Sodium: 133 mEq/L — ABNORMAL LOW (ref 135–145)

## 2010-05-29 LAB — CARDIAC PANEL(CRET KIN+CKTOT+MB+TROPI)
CK, MB: 2.3 ng/mL (ref 0.3–4.0)
CK, MB: 2.2 ng/mL (ref 0.3–4.0)
Relative Index: 0.3 (ref 0.0–2.5)
Relative Index: 0.4 (ref 0.0–2.5)
Total CK: 750 U/L — ABNORMAL HIGH (ref 7–177)
Total CK: 601 U/L — ABNORMAL HIGH (ref 7–177)
Troponin I: 0.04 ng/mL (ref 0.00–0.06)
Troponin I: 0.03 ng/mL (ref 0.00–0.06)

## 2010-05-30 LAB — BASIC METABOLIC PANEL
BUN: 39 mg/dL — ABNORMAL HIGH (ref 6–23)
CO2: 24 mEq/L (ref 19–32)
Calcium: 8.7 mg/dL (ref 8.4–10.5)
Chloride: 103 mEq/L (ref 96–112)
Creatinine, Ser: 1.14 mg/dL (ref 0.4–1.2)
GFR calc Af Amer: 58 mL/min — ABNORMAL LOW (ref >60)
GFR calc non Af Amer: 48 mL/min — ABNORMAL LOW (ref >60)
Glucose, Bld: 197 mg/dL — ABNORMAL HIGH (ref 70–99)
Potassium: 4.1 mEq/L (ref 3.5–5.1)
Sodium: 139 mEq/L (ref 135–145)

## 2010-05-30 LAB — GLUCOSE, CAPILLARY
Glucose-Capillary: 192 mg/dL — ABNORMAL HIGH (ref 70–99)
Glucose-Capillary: 169 mg/dL — ABNORMAL HIGH (ref 70–99)

## 2010-05-30 LAB — CBC
HCT: 33.6 % — ABNORMAL LOW (ref 36.0–46.0)
Hemoglobin: 10.7 g/dL — ABNORMAL LOW (ref 12.0–15.0)
MCH: 26.8 pg (ref 26.0–34.0)
MCHC: 31.8 g/dL (ref 30.0–36.0)
MCV: 84.2 fL (ref 78.0–100.0)
Platelets: 183 10*3/uL (ref 150–400)
RBC: 3.99 MIL/uL (ref 3.87–5.11)
RDW: 14.7 % (ref 11.5–15.5)
WBC: 12.7 10*3/uL — ABNORMAL HIGH (ref 4.0–10.5)

## 2010-05-30 LAB — URINE CULTURE
Colony Count: >=100000
Culture  Setup Time: 201202210441

## 2010-05-31 LAB — URINE CULTURE
Colony Count: >=100000
Culture  Setup Time: 201202210152

## 2010-06-04 ENCOUNTER — Telehealth: Payer: Self-pay | Admitting: Family Medicine

## 2010-06-04 NOTE — Telephone Encounter (Signed)
Spoke with Dr. Armen Pickup. She will handle. States she called it in at the same time she called the other meds in.Bonnie Peterson

## 2010-06-04 NOTE — Telephone Encounter (Signed)
Called the patient. She did not receive the antibiotics. I suggested that I call the prescription into a retail pharmacy instead of a mail order. She requested [facility]. Called in Levaquin 750  Mg PO q d x 7 days.

## 2010-06-04 NOTE — Telephone Encounter (Signed)
Pt states she got the spiriva & tessalon pearles but no antibiotic. She is upset. Told her I will speak with the md (Funches) & call her when I have a response.Elijah Birk, Esperanza Richters

## 2010-06-04 NOTE — Telephone Encounter (Signed)
Was supposed to be on abx and didn't get called into pharm, wants to know why.

## 2010-06-11 NOTE — H&P (Signed)
Bonnie Peterson, Bonnie Peterson               ACCOUNT NO.:  [id number]  MEDICAL RECORD NO.:  [id number]           PATIENT TYPE:  I  LOCATION:  1093                         FACILITY:  [facility]  PHYSICIAN:  Talbert Cage, M.D.DATE OF BIRTH:  [birth year]  DATE OF ADMISSION:  05/29/2010 DATE OF DISCHARGE:                             HISTORY & PHYSICAL   PRIMARY CARE PHYSICIAN:  Hulan Saas, DO, at [facility].  CHIEF COMPLAINT:  Shortness of breath and dizziness.  HISTORY OF PRESENT ILLNESS:  The patient is a 65 year old female with a known history of restrictive lung disease who reports a 5-day history of worsening shortness of breath associated with a productive cough and generalized malaise followed by a 3-day history of weakness and dizziness.  She states that her symptoms started last Thursday after she had been taking care of her great grandson for 5 days who also had a cough and runny nose.  She states that her cough was productive of brown sputum, no blood.  She also reports feeling sick what she described as nausea, no appetite, last ate and drank on Thursday prior to the day before admission, feeling weak, increased fatigue, chills, and subjective fever.  She also has had diarrhea x2 episodes daily since Thursday.  She started feeling weak and lightheaded on Saturday night and had an episode of syncope while sitting on the toilet early Sunday morning.  She denies head trauma.  She also denies vomiting.  She admits to one episode of chest pain while in ED, right sided, nonradiating, lasting 2 minutes.  She denies palpitations, loss or change in vision, ringing in ears, tingling or numbness in extremities.  When asked about urinary symptoms, the patient endorses frequency and incontinence since Thursday which was changed from her baseline.  She denies dysuria.  [name] ED COURSE:  In the ED, the patient received Atrovent neb x1, albuterol neb x1,  Solu-Medrol 125 mg IV x1, 50 mL bolus of normal saline.  HOME MEDICATIONS:  At home, the patient takes: 1. Allopurinol 300 mg p.o. daily. 2. Amlodipine 10 mg p.o. daily. 3. Aspirin 81 mg p.o. daily. 4. Atenolol 50 mg p.o. daily. 5. Lipitor 10 mg p.o. daily. 6. Colchicine 0.6 mg p.o. daily. 7. Flexeril 5 mg p.o. daily as needed for back, knee, shoulder pain,     usually the patient takes at night. 8. Bentall 20 mg p.o. 4 times a day. 9. Vasotec 20 mg p.o. daily. 10.Fluticasone 50 mcg nasal spray, 1 spray by nasal route daily. 11.Glipizide 20 mg p.o. once daily, take 10 mg tabs. 12.Hydrochlorothiazide 25 mg p.o. daily. 13.Claritin 10 mg tabs p.o. daily. 14.Nitroglycerin 0.4 mg sublingual under tongue every 5 minutes as     needed. 15.The patient has not needed to take Actos 30 mg p.o. daily. 16.Zoloft 100 mg p.o. daily. 17.Trazodone 50 mg p.o. at bedtime. 18.The patient takes at home nicotine patch, 21 mg patch transdermal     daily.  ALLERGIES:  The patient is allergic to metformin.  CODE STATUS:  DNR/DNI.  PAST MEDICAL HISTORY:  Significant for mild pulmonary hypertension on echo, obtained  in October 2010 with mild LVH, obstructive sleep apnea, restrictive lung disease, hypertension, hyperlipidemia, benign positional vertigo, depression, anxiety, osteoarthritis, irritable bowel syndrome, obesity, class III gout.  PAST SURGICAL HISTORY:  Significant for partial hysterectomy, rotator cuff repair, left tonsillectomy.  FAMILY HISTORY:  Significant for mother with breast cancer, hypertension, coronary artery disease, diabetes type 2.  Sister with diabetes type 2 and pancreatic cancer.  SOCIAL HISTORY:  The patient is single, divorced in 2007, has 4 adult children who live in town and visits her regularly.  She is unemployed, on disability.  Smoking status, she quit on May 15, 2010.  She previously smoked half a pack a day for 40 years.  She denies EtOH, she denies  illicit drug use.  PERTINENT REVIEW OF SYSTEMS:  As per HPI.  PHYSICAL EXAMINATION:  VITAL SIGNS:  Temperature 98.5.  Initially in the ED the patient's blood pressure was 99/63 and then it was 93/56 and 97/60.  By the time she got to the floor at [facility], she was 101/58.  Heart rate 76, respiratory rate 22, O2 sats 93% on room air. GENERAL:  The patient is awake.  She is in no acute distress.  She is appropriate to exam. HEENT:  PERRLA, dry mucous membranes.  Oropharynx without erythema or edema.  No cervical lymphadenopathy. CARDIOVASCULAR:  S1 and S2.  Regular rate and rhythm.  No murmurs, rubs, or gallops. RESPIRATORY:  Normal work of breathing, poor aeration bilaterally.  Few scattered high-pitched inspiratory wheezing bilaterally.  No crackles. ABDOMEN:  Normoactive bowel sounds.  Soft, nontender.  No masses. EXTREMITIES:  No edema.  Dry skin. MUSCULOSKELETAL:  Strength 5/5 in bilateral upper and lower extremities. NEUROLOGIC:  Alert and oriented x3.  Cranial nerves II through XII grossly intact.  Strength and sensation intact bilaterally.  LABORATORY DATA AND RESULTS:  ABG 7.434, pCO2 40, pO2 64.9, bicarb 26.3, O2 sat 93.2, base excess 2.4.  BMET sodium 135, potassium 3.8, chloride 98, CO2 of 27, BUN 31, creatinine 2.3, glucose 98.  CBC, white blood cells 9.2, hemoglobin 12.7, hematocrit 39.7, MCV 87.1, platelets 160,000.  BNP 39.3.  Cardiac enzymes, total CK 967, troponin 0.03.  UA, amber urine, turbid, specific gravity 1.029.  Negative for glucose, moderate blood, greater than 30 protein, negative for nitrite, moderate leukocyte.  U-micro, many squamous, 21-50 white blood cells, 7-10 red blood cells, many bacteria.  Urine culture pending.  Chest x-ray stable cardiomegaly with pulmonary vascular congestion and mild bibasilar edema.  EKG is sinus arrhythmia, no ST elevation or depression, heart rate 73.  ASSESSMENT AND PLAN:  A 65 year old female with shortness of  breath and dizziness. 1. Shortness of breath, likely secondary to viral exacerbation of     baseline pulmonary hypertension/COPD.  She is edematous on exam.     She does not have an elevated BNP, so therefore does not appear to     be in right heart failure.  We will treat with supplemental O2 as     needed to maintain O2 sats greater than 92.  Continue q.2 and q.4     albuterol and Atrovent nebs to be spaced as needed.  Continue     steroids.  Oral prednisone 60 mg p.o. daily.  We will cycle cardiac     enzymes to be sure shortness of breath is noncardiac, anticipate     normal cardiac enzymes except elevated  total CK which should trend     down with IV fluids.  Add CPAP  at night for obstructive sleep     apnea. 2. Dizziness, likely presyncope secondary to hypovolemia.  This is     supported by history of diarrhea, hypertension, evidence of     dehydration on UA, elevated creatinine.  Also per EDP, the patient     was too weak to stand in the ED to obtain orthostatic vital signs,     can also consider acute neurological cause, but symptoms are not     sudden in onset and still persists.  Also, there are no other focal     neurological deficits.  The patient has history of benign     positional vertigo that resolved once she moved to the ground floor     from the twelfth floor of her apartment.  Since dizziness was     likely secondary to dehydration, the plan is to hydrate with normal     saline and 20 mEq of KCl, give another 0.5 L, then KVO.  The     patient is asking for food and drinks.  If she is unable to     tolerate p.o. intake, we will restart IV fluids after the 1.5 L.     If symptoms persist despite hydration, we will obtain imaging. 3. Diarrhea, likely viral etiology.  No history of recent antibiotic     or hospitalization course to suggest C. diff decreasing in amount.     Plan for IV hydration.  We will obtain C. diff PCR if     persistent/increased. 4. Probable urinary  tract infection.  Based on symptoms and UA, U-     micro does not appear to be a clean catch (we will treat pending     urine culture with ceftriaxone). 5. Chest pain, transient in the ED, right sided, likely secondary to     anxiety.  We will cycle cardiac enzymes. 6. Acute renal insufficiency, likely multifactorial, secondary to     dehydration and infection.  We will hold ACE, give IV hydration,     ceftriaxone, repeat BMET to monitor for improvement. 7. Weakness.  The patient acutely deconditioned from illness. 8. Elevated CK shows that the patient has been mostly bed-bound for     last few days.  We will obtain PT consult to assist with     therapy/home health needs. 9. History of hypertension.  Hold all antihypertensives except     amlodipine which I will continue if the patient has baseline     pulmonary hypertension. 10.History of hyperlipidemia.  Continue Lipitor. 11.Non-insulin-dependent diabetes mellitus, well controlled.  Last A1c     was 6.6 in November 2011.  Sensitive sliding scale.  We will hold     oral hypoglycemics. 12.History of tobacco, 21 mg nicotine patch transdermal daily. 13.History of depression.  Mood stable.  We will continue home dose of     Zoloft. 14.History of insomnia.  Home dose of trazodone. 15.Fluids, electrolytes, nutrition, gastrointestinal.  Heart-healthy     diet. 16.Deep vein thrombosis prophylaxis.  Lovenox 40 subcu daily.  DISPOSITION:  The patient will likely go home possibly with short-term home health RN/PT in the next 1-2 days.    ______________________________ Boykin Nearing, MD   ______________________________ Talbert Cage, M.D.   JF/MEDQ  D:  05/29/2010  T:  05/29/2010  Job:  [id number]  cc:   Hulan Saas, DO  Electronically Signed by Boykin Nearing MD on 05/31/2010 11:24:14 AM Electronically Signed by Talbert Cage M.D. on 06/11/2010 11:31:38 AM

## 2010-06-14 ENCOUNTER — Encounter: Payer: Self-pay | Admitting: Family Medicine

## 2010-06-14 ENCOUNTER — Ambulatory Visit (INDEPENDENT_AMBULATORY_CARE_PROVIDER_SITE_OTHER): Payer: No Typology Code available for payment source | Admitting: Family Medicine

## 2010-06-14 VITALS — BP 161/81 | HR 82 | Temp 98.2°F | Wt 215.6 lb

## 2010-06-14 DIAGNOSIS — J449 Chronic obstructive pulmonary disease, unspecified: Secondary | ICD-10-CM

## 2010-06-14 DIAGNOSIS — I10 Essential (primary) hypertension: Secondary | ICD-10-CM

## 2010-06-14 DIAGNOSIS — E119 Type 2 diabetes mellitus without complications: Secondary | ICD-10-CM

## 2010-06-14 LAB — POCT GLYCOSYLATED HEMOGLOBIN (HGB A1C): Hemoglobin A1C: 7.3

## 2010-06-14 MED ORDER — ATENOLOL 50 MG PO TABS: 25.0000 mg | ORAL_TABLET | Freq: Every day | ORAL | Status: DC

## 2010-06-14 MED ORDER — ENALAPRIL MALEATE 20 MG PO TABS: 20.0000 mg | ORAL_TABLET | Freq: Every day | ORAL | Status: DC

## 2010-06-14 NOTE — Assessment & Plan Note (Signed)
Placed pt on spiriva told pt what we are trying to accomplish.  Pt in agreement.

## 2010-06-14 NOTE — Patient Instructions (Signed)
Good to see you  Congratulations on stopping smoking! I want you to use the Spirva daily I want you to take your enalapril just one time a day I want you to take half your blood pressure medication I want to see you again in 1 month to see how your blood pressure is doing.

## 2010-06-14 NOTE — Assessment & Plan Note (Signed)
Pt is elevated at moment would like to add enalapril back on due to diabetes, will start at half dose, will also have pt start atenolol 25.  Will have pt come back in 1 week and see again.  At that time will need BMET.

## 2010-06-14 NOTE — Progress Notes (Signed)
  Subjective:    Patient ID: Bonnie Peterson, female    DOB: 1945-08-12, 65 y.o.   MRN: [id number]  HPI Pt is here for hospital f/u for COPD exacerbation.  Pt is feeling much better.  Pt has stopped the Avalox and has stopped the prednisone.  Pt was put on Spirva but has stopped that as well due to feeling well.  Pt denies fever, chills, nausea, vomiting diarrhea or constipation.  Pt able to do activities of daily living without limitation.   Pt has stopped smoking for the last month and is feeling great not having any cravings at this time.   Pt was taken off two of her medications due to low blood pressures while in the hospital today readings are elevated.  161/81.  Pt denies headache, visual changes, dizziness or light headiness.   2. Diabetes:  High at home:225 Low at home:100 Taking medications:yes glipizide and actos Side effects: no ROS: denies fever, chills, dizziness, loss of conscieness, polyuria poly dipsia numbness or tingling in extremities or chest pain.   Review of Systems See above    Objective:   Physical Exam     General Appearance:    Alert, cooperative, no distress, appears stated age  Head:    Normocephalic, without obvious abnormality, atraumatic  Eyes:    PERRL, conjunctiva/corneas clear, EOM's intact     Nose:   Nares normal, septum midline, mucosa normal, no drainage    or sinus tenderness  Throat:   Lips, mucosa, and tongue normal; teeth and gums normal  Neck:   Supple, symmetrical, trachea midline     Lungs:     Clear to auscultation bilaterally, respirations unlabored      Heart:    Regular rate and rhythm, S1 and S2 normal, 1/6SEM murmur     Abdomen:     Soft, non-tender, bowel sounds active all four quadrants,    no masses, no organomegaly        Extremities:   Extremities normal, atraumatic, no cyanosis trace edema  Pulses:   2+ and symmetric all extremities  Skin:   Skin color, texture, turgor normal, no rashes or lesions     Neurologic:    CNII-XII intact, normal strength, sensation and reflexes    throughout       Assessment & Plan:

## 2010-06-14 NOTE — Assessment & Plan Note (Signed)
A1C today, will see how pt is doing, would like to get pt off actos, but pt will not take metformin or insulin at moment.  Pt would like to avoid injections.  Will discuss after A1C back and try to decrease actos or stop in 1 month.

## 2010-06-25 NOTE — Discharge Summary (Signed)
NAMECARY, Bonnie Peterson               ACCOUNT NO.:  [id number]  MEDICAL RECORD NO.:  [id number]           PATIENT TYPE:  I  LOCATION:  4501                         FACILITY:  [facility]  PHYSICIAN:  Jamal Collin. Jocelynne Duquette, M.D.DATE OF BIRTH:  December 24, 1945  DATE OF ADMISSION:  05/29/2010 DATE OF DISCHARGE:  05/30/2010                              DISCHARGE SUMMARY   DISCHARGE DIAGNOSES: 1. Chronic obstructive pulmonary disease exacerbation. 2. Urinary tract infection. 3. Dehydration. 4. Acute renal insufficiency. 5. History of non-insulin dependent diabetes mellitus.  DISCHARGE MEDICATIONS:  New medications at discharge: 1. Tessalon 100 mg 1 capsule p.o. t.i.d. as needed for cough. 2. Avelox 400 mg 1 tablet p.o. daily. 3. Prednisone 20 mg 1 tablet p.o. daily for 7 days. 4. Spiriva 18 mcg capsule with inhaler, inhale one capsule daily.  Home medications continued: 1. Actos 30 mg p.o. daily. 2. Albuterol inhaler 2 puffs, inhaled every 4 hours as needed     shortness of breath. 3. Allopurinol 300 mg daily. 4. Amlodipine 10 mg 1 tablet p.o. daily. 5. Aspirin 81 mg p.o. daily. 6. Claritin 10 mg p.o. daily. 7. Dicyclomine 20 mg p.o. q.i.d. 8. Fish oil 1 capsule p.o. 2 times per month. 9. Flexeril 5 mg p.o. daily at bedtime as needed for pain. 10.Flonase nasal spray 50 mcg per spray, 1 spray nasally daily. 11.Glipizide 10 mg 2 tablets p.o. daily. 12.HCTZ 25 mg 1 tablet p.o. daily. 13.Multivitamin. 14.Women's Ultra OTC 1 tablet p.o. 2 times per month. 15.Nitroglycerin 0.4 mg tabs 1 tablet under tongue every 5 minutes as     needed for chest pain.  The patient is to call EMS after 3 tablets. 16.Sertraline 100 mg 2 tablets p.o. daily. 17.Trazodone 50 mg 1 tablet p.o. nightly as needed for sleep. 18.Ultram 50 mg 1 tablet p.o. 4 times daily. 19.Vitamin B12 1 tablet p.o. twice a month. 20.Vitamin E, 1 capsule p.o. twice a month.  Home medications to hold until followup with PCP: 1. Atenolol 50  mg 1 tablet p.o. q.a.m. 2. Enalapril 20 mg 1 tablet p.o. twice daily.  PERTINENT LABS AT DISCHARGE:  Total CK is 601 down from 967, creatinine 1.14, white blood cells 12.7, most recent CBG 227.  Urine culture greater than 100,000 E. coli, sensitive to the quinolone.  CONSULTS:  None.  BRIEF HOSPITAL COURSE:  The patient is a 65 year old female with a known history of pulmonary hypertension, who presented with shortness of breath, hypovolemia, and dizziness. 1. COPD exacerbation.  The patient improved with albuterol and     Atrovent nebulizers.  She was started on Spiriva for controller     meds and continued on albuterol q.6 as scheduled, q.2 p.r.n.  She     was started on prednisone, initially 60 p.o. daily and then 20 p.o.     daily for 1 week.  She was also started on Avelox.  The patient's     respiratory status improved by discharge.  She was saturating no less     than 90s on room air. 2. UTI.  The patient's urine culture is positive for greater than  100,000 E-coli sensitive to Avelox, was continued on Avelox to     complete a 1-week course.  3. Acute renal insufficiency.  The patient's acute renal insufficiency     was secondary to dehydration.  She was rehydrated with IV fluids     and her creatinine trended down as well as total CK.  Her blood     pressure stabilized.  At discharge, her pressure was 103/63.  She     was continued on her home dose of amlodipine and asked to hold her     atenolol and enalapril until she follows up with her primary MD. 4. Non-insulin-dependent diabetes.  The patient has elevated CBGs.     During hospital admission, highest 339, was secondary to steroid     administration.  The patient continued on her home dose of     glipizide to be continued at discharge.  DISCHARGE PHYSICAL EXAM:  GENERAL:  On day of discharge, the patient was in no acute distress.  She is comfortable on room air. VITALS:  Stable. PULMONARY:  She still has better  inspiratory wheezes bilaterally and normal work of breathing.  FOLLOWUP ISSUES AND RECOMMENDATIONS: 1. Creatinine.  Recheck the patient's creatinine.  Creatinine baseline     0.05 to 0.85.  At discharge, 1.14.  She is normalized with     continued p.o. hydration. 2. Follow up the patient's blood pressure, asked the patient to hold     atenolol and enalapril, may restart as the patient tolerates. 3. Follow up the patient's COPD, persistent cough.  The patient is     stable on Spiriva and albuterol.  She will complete prednisone     course by the time she follows up with her primary MD.  DISCHARGE INSTRUCTIONS:  The patient is discharged to home with no limitations on activity or diet.  She is instructed to follow up with her PCP, Dr. Hulan Saas, at [facility] with appointment scheduled for June 14, 2010, at 9:15 a.m.  DISCHARGE CONDITION:  On day of discharge, the patient is in stable medical condition.    ______________________________ Boykin Nearing, MD   ______________________________ Jamal Collin. Andria Frames, M.D.    JF/MEDQ  D:  05/31/2010  T:  06/01/2010  Job:  [id number]  cc:   Hulan Saas, DO  Electronically Signed by Boykin Nearing MD on 06/20/2010 04:17:54 PM Electronically Signed by Madison Hickman M.D. on 06/25/2010 02:09:50 PM

## 2010-07-12 LAB — GLUCOSE, CAPILLARY
Glucose-Capillary: 104 mg/dL — ABNORMAL HIGH (ref 70–99)
Glucose-Capillary: 109 mg/dL — ABNORMAL HIGH (ref 70–99)
Glucose-Capillary: 124 mg/dL — ABNORMAL HIGH (ref 70–99)

## 2010-07-12 LAB — COMPREHENSIVE METABOLIC PANEL
ALT: 19 U/L (ref 0–35)
AST: 24 U/L (ref 0–37)
Albumin: 3.7 g/dL (ref 3.5–5.2)
Alkaline Phosphatase: 105 U/L (ref 39–117)
BUN: 22 mg/dL (ref 6–23)
CO2: 24 mEq/L (ref 19–32)
Calcium: 9.7 mg/dL (ref 8.4–10.5)
Chloride: 104 mEq/L (ref 96–112)
Creatinine, Ser: 0.78 mg/dL (ref 0.4–1.2)
GFR calc Af Amer: >60 mL/min (ref >60)
GFR calc non Af Amer: >60 mL/min (ref >60)
Glucose, Bld: 92 mg/dL (ref 70–99)
Potassium: 4.2 mEq/L (ref 3.5–5.1)
Sodium: 137 mEq/L (ref 135–145)
Total Bilirubin: 0.6 mg/dL (ref 0.3–1.2)
Total Protein: 7.7 g/dL (ref 6.0–8.3)

## 2010-07-12 LAB — CBC
HCT: 40.1 % (ref 36.0–46.0)
Hemoglobin: 13.2 g/dL (ref 12.0–15.0)
MCHC: 32.8 g/dL (ref 30.0–36.0)
MCV: 86.9 fL (ref 78.0–100.0)
Platelets: 197 10*3/uL (ref 150–400)
RBC: 4.62 MIL/uL (ref 3.87–5.11)
RDW: 14.3 % (ref 11.5–15.5)
WBC: 9.9 10*3/uL (ref 4.0–10.5)

## 2010-07-12 LAB — LIPID PANEL
Cholesterol: 157 mg/dL (ref 0–200)
HDL: 23 mg/dL — ABNORMAL LOW (ref >39)
LDL Cholesterol: 64 mg/dL (ref 0–99)
Total CHOL/HDL Ratio: 6.8 RATIO
Triglycerides: 349 mg/dL — ABNORMAL HIGH (ref <150)
VLDL: 70 mg/dL — ABNORMAL HIGH (ref 0–40)

## 2010-07-12 LAB — CARDIAC PANEL(CRET KIN+CKTOT+MB+TROPI)
CK, MB: 0.7 ng/mL (ref 0.3–4.0)
CK, MB: 0.8 ng/mL (ref 0.3–4.0)
CK, MB: 0.9 ng/mL (ref 0.3–4.0)
Relative Index: INVALID (ref 0.0–2.5)
Relative Index: INVALID (ref 0.0–2.5)
Relative Index: INVALID (ref 0.0–2.5)
Total CK: 55 U/L (ref 7–177)
Total CK: 55 U/L (ref 7–177)
Total CK: 64 U/L (ref 7–177)
Troponin I: 0.02 ng/mL (ref 0.00–0.06)
Troponin I: 0.02 ng/mL (ref 0.00–0.06)
Troponin I: 0.03 ng/mL (ref 0.00–0.06)

## 2010-07-17 ENCOUNTER — Encounter: Payer: Self-pay | Admitting: Family Medicine

## 2010-07-17 ENCOUNTER — Ambulatory Visit (INDEPENDENT_AMBULATORY_CARE_PROVIDER_SITE_OTHER): Payer: No Typology Code available for payment source | Admitting: Family Medicine

## 2010-07-17 VITALS — BP 140/80 | HR 85 | Wt 225.0 lb

## 2010-07-17 DIAGNOSIS — I10 Essential (primary) hypertension: Secondary | ICD-10-CM

## 2010-07-17 DIAGNOSIS — E119 Type 2 diabetes mellitus without complications: Secondary | ICD-10-CM

## 2010-07-17 LAB — BASIC METABOLIC PANEL
BUN: 18 mg/dL (ref 6–23)
CO2: 23 mEq/L (ref 19–32)
Calcium: 9.9 mg/dL (ref 8.4–10.5)
Chloride: 101 mEq/L (ref 96–112)
Creat: 0.63 mg/dL (ref 0.40–1.20)
Glucose, Bld: 120 mg/dL — ABNORMAL HIGH (ref 70–99)
Potassium: 4.4 mEq/L (ref 3.5–5.3)
Sodium: 138 mEq/L (ref 135–145)

## 2010-07-17 MED ORDER — SITAGLIPTIN PHOSPHATE 50 MG PO TABS: 100.0000 mg | ORAL_TABLET | Freq: Every day | ORAL | Status: DC

## 2010-07-17 NOTE — Assessment & Plan Note (Addendum)
Januvia will be started will stop the Actos. Pt very nervous about the medication and this will help the worrying, told of potential side effects but like Actos should not cause her to go to low. Will see again in 2-3 months and recheck A1C.

## 2010-07-17 NOTE — Assessment & Plan Note (Signed)
Near goal, will continue meds encouraged again to watch diet and exercise.  Will get BMET today make sure creatinine not bumped.  RTC in 2 months.

## 2010-07-17 NOTE — Patient Instructions (Addendum)
Stop the ACTOS Start Januvia at 100mg  daily I want to see you again in 2-3 months.  Your blood pressure is great.

## 2010-07-17 NOTE — Progress Notes (Signed)
  Subjective:    Patient ID: Bonnie Peterson, female    DOB: [birth year], 65 y.o.   MRN: [id number]  HPI 1. Hypertension Blood pressure at home: 120-140 SBP Blood pressure today: 127/78 Taking Meds:yes Side effects:maybe a cough but think it is due to allergies ROS: Denies headache visual changes nausea, vomiting, chest pain or abdominal pain or shortness of breath.   2. Diabetes:  High at home:200 Low at home:99 Taking medications:yes but really wants to get off the Actos, and last A1C was 7.3 Side effects:no ROS: denies fever, chills, dizziness, loss of conscieness, polyuria poly dipsia numbness or tingling in extremities or chest pain.    Review of Systems Denies fever, chills, nausea vomiting abdominal pain, dysuria, chest pain, shortness of breath dyspnea on exertion or numbness in extremities     Objective:   Physical Exam  General Appearance:    Alert, cooperative, no distress, appears stated age  Head:    Normocephalic, without obvious abnormality, atraumatic  Eyes:    PERRL, conjunctiva/corneas clear, EOM's intact  Nose:   Nares normal, septum midline, mucosa normal, no drainage    or sinus tenderness  Throat:   Lips, mucosa, and tongue normal; teeth and gums normal  Lungs:     Clear to auscultation bilaterally, respirations unlabored      Heart:    Regular rate and rhythm, S1 and S2 normal, 1/6SEM murmur  Abdomen:     Soft, non-tender, bowel sounds active all four quadrants,    no masses, no organomegaly  Extremities:   Extremities normal, atraumatic, no cyanosis trace edema  Pulses:   2+ and symmetric all extremities            Assessment & Plan:

## 2010-08-24 NOTE — Op Note (Signed)
Bonnie Peterson, Bonnie Peterson               ACCOUNT NO.:  [id number]   MEDICAL RECORD NO.:  [id number]          PATIENT TYPE:  AMB   LOCATION:  [place]                          FACILITY:  [facility]   PHYSICIAN:  Ninetta Lights, M.D. 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   POSTOPERATIVE DIAGNOSES:  1. 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  2. Complex tearing of labrum.   PROCEDURE:  Left shoulder exam, manipulation under anesthesia.  Arthroscopy,  debridement of rotator cuff, biceps tendon and labrum.  Acromioplasty, CA  ligament release.  Excision of distal clavicle.   SURGEON:  Ninetta Lights, M.D.   ASSISTANT:  Sanda Linger, PA   ANESTHESIA:  General.   ESTIMATED BLOOD LOSS:  Minimal.   SPECIMENS:  None.   CULTURES:  None.   COMPLICATIONS:  None.   DRESSING:  Soft compressive with sling.   PROCEDURE:  The patient was brought to the operating room and after adequate  anesthesia had been obtained, the shoulder was examined.  She had one  relatively profound adhesion in about 90 degrees of abduction which when  broken up completely released her shoulder and then had full motion.  Good  stability.  Placed in a beach chair position on the shoulder positioner and  prepped and draped in the usual sterile fashion.  Three portals anterior  posterior and lateral.  Shoulder __________, arthroscope introduced,  shoulder distended and inspected.  Marked complex tearing of labrum,  especially on the top, all debrided.  Biceps tendon, biceps anchor intact.  Some changes in the tendon and a little partial tearing debrided but mostly  still good integrity.  A  lot of partial tearing in the crest region of the  supraspinatus and top half of the infraspinatus.  All of this debrided.  Cable still well anchored.  No full-thickness tears.  Evidence of tearing of  the inferior capsule from manipulation.  Articular cartilage otherwise still  good.  Cannula redirected to the subacromion.  Adhesive bursitis debrided.  Type 2 acromion.  Marked impingement from there as well as well as marked  impingement at the [facility] joint where there was significant spurs and grade 4  changes.  Cuff debrided.  Bursa resected.  Acromioplasty to the top of the  acromion, releasing the CA ligament with cautery. Distal clavicle with grade  4 changes.  Lateral centimeter para-articular spurs resected.  __________ decompression, cuff debridement, acromioplasty confirmed, viewed  from all portals. Instruments and fluid removed.  Portals closed with 4-0  nylon.  Sterile compressive dressing applied.  Sling applied.  Anesthesia  reversed and brought to recovery room. Tolerated the surgery well with no  complications.      Ninetta Lights, M.D.  Electronically Signed     DFM/MEDQ  D:  02/19/2006  T:  02/20/2006  Job:  [id number]

## 2010-08-24 NOTE — Op Note (Signed)
NAMEASMARA, BACKS               ACCOUNT NO.:  [id number]   MEDICAL RECORD NO.:  [id number]          PATIENT TYPE:  AMB   LOCATION:  ENDO                         FACILITY:  [facility]   PHYSICIAN:  John C. Amedeo Plenty, M.D.    DATE OF BIRTH:  30-Dec-1945   DATE OF PROCEDURE:  04/19/2004  DATE OF DISCHARGE:                                 OPERATIVE REPORT   PROCEDURE:  Colonoscopy.   INDICATIONS FOR PROCEDURE:  Chronic diarrhea.   PROCEDURE:  The patient was placed in the left lateral decubitus position  then placed on the pulse monitor with continuous low-flow oxygen delivered  by nasal cannula. She was sedated with 37.5 mcg IV fentanyl in addition to  the 2 milligrams IV Versed. The Olympus video colonoscope was inserted into  the rectum and advanced to cecum, confirmed by transillumination at  McBurney's point and visualization of ileocecal valve and appendiceal  orifice. The prep was excellent. The cecum, ascending, and transverse colon  appeared normal with no masses, polyps, diverticula or other mucosal  abnormalities. There were several diverticula seen in the descending and  sigmoid colon. There was also a somewhat poorly circumscribed lipoma in the  sigmoid colon approximately 2 cm in diameter. This was not biopsied. The  mucosa overlying it appeared normal. Biopsies were taken of the sigmoid and  rectum to rule out microscopic colitis. The rectum itself appeared normal.  The scope was then withdrawn and the patient returned to the recovery room  in stable condition. He tolerated the procedure well. There were no  immediate complications.   IMPRESSION:  1.  Diverticulosis.  2.  Sigmoid lipoma.   PLAN:  Await all biopsy results.      JCH/MEDQ  D:  04/19/2004  T:  04/19/2004  Job:  [id number]   cc:   Beatrice Lecher, M.D.  [address] [place], [place] [address]  Fax: [phone]

## 2010-08-24 NOTE — Op Note (Signed)
NAMEVICTOR, Bonnie Peterson               ACCOUNT NO.:  [id number]   MEDICAL RECORD NO.:  [id number]          PATIENT TYPE:  AMB   LOCATION:  [place]                          FACILITY:  [facility]   PHYSICIAN:  Ninetta Lights, M.D. DATE OF BIRTH:  17-Jan-1946   DATE OF PROCEDURE:  07/30/2006  DATE OF DISCHARGE:                               OPERATIVE REPORT   PREOPERATIVE DIAGNOSES:  Right shoulder impingement; labral tear; marked  spurring and degenerative joint disease, acromioclavicular joint.   POSTOPERATIVE DIAGNOSES:  Right shoulder impingement; labral tear;  marked spurring and degenerative joint disease, acromioclavicular joint,  with also a chondral loose body from a grade 3 glenoid lesion.   PROCEDURES:  1. Right shoulder examination under anesthesia, arthroscopy,      debridement of a loose body.  2. Chondroplasty of the glenoid.  3. Debridement of labral tear.  4. Debridement of rotator cuff.  5. Acromioplasty.  6. Coracoacromial ligament release.  7. Excision, distal clavicle.   SURGEON:  Ninetta Lights, M.D.   ASSISTANT:  Benjiman Core, PA-C, [place], MS-4   ANESTHESIA:  General.   SPECIMENS:  None.   CULTURES:  None.   COMPLICATIONS:  None.   DRESSING:  Soft compressive with sling.   PROCEDURE:  The patient brought to the operating room, placed on the  operating table in the supine position.  After adequate anesthesia had  been obtained, the right shoulder examined.  Full motion and stable  shoulder.  Placed in a beach-chair position on the shoulder positioner  and prepped and draped in the usual sterile fashion.  Three portals,  anterior, posterior and lateral.  Arthroscope introduced, the shoulder  distended and inspected from the posterior portal.  Articular cartilage  looked good except for a small chondral lesion below the glenoid,  debrided.  Not full-thickness.  There was a loose body removed.  Complex  tearing, anterior labrum, extending into a small  paralabral cyst.  Both  of these debrided.  Capsular and ligamentous structures, remaining  articular cartilage, biceps tendon and biceps anchor intact.  A little  fraying on the undersurface rotator cuff crescent region, debrided.  No  full-thickness tears.  From above, a type 3 acromion and marked  spurring, AC joint, reactive bursitis and a lot of abrasive changes but  no full-thickness tears.  Bursa resected, cuff debrided.  Acromioplasty  to a type 1 acromion, releasing the CA ligament.  Distal clavicle  exposed, grade 4 changes.  Periarticular spurs and the lateral  centimeter of the clavicle resected.  Adequacy of decompression,  clavicle excision, cuff debridement confirmed viewing from all portals.  Instruments and fluid removed.  Portals, shoulder and bursa injected  with Marcaine.  Portals closed with 4-0 nylon.  Sterile compressive  dressing applied.  Sling applied.  Anesthesia reversed.  Brought to the  recovery room.  Tolerated the surgery well.  No complications.      Ninetta Lights, M.D.  Electronically Signed     DFM/MEDQ  D:  07/30/2006  T:  07/30/2006  Job:  [id number]

## 2010-08-24 NOTE — Op Note (Signed)
NAMESUZANNA, Bonnie Peterson               ACCOUNT NO.:  [id number]   MEDICAL RECORD NO.:  [id number]          PATIENT TYPE:  AMB   LOCATION:  ENDO                         [facility]ACILITY:  [facility]   PHYSICIAN:  John C. Amedeo Plenty, M.D.    DATE OF BIRTH:  [birth year]   DATE OF PROCEDURE:  04/19/2004  DATE OF DISCHARGE:                                 OPERATIVE REPORT   PROCEDURE:  Esophagogastroduodenoscopy with biopsy.   INDICATION FOR PROCEDURE:  Chronic gastroesophageal reflux, as well as  chronic diarrhea.  This procedure is in conjunction with colonoscopy to  evaluate the chronic diarrhea.   PROCEDURE:  The patient was placed in the left lateral decubitus position  and placed on the pulse monitor, with continuous low-flow oxygen delivered  by nasal cannula.  She was sedated with 62.5 mcg IV fentanyl and 6 mg IV  Versed.  The Olympus video endoscope was advanced under direct vision into  the oropharynx and esophagus.  The esophagus was straight and of normal  caliber, with the squamocolumnar line at 38 cm.  There was no visible hiatal  hernia, ring, stricture, or other abnormality of the GE junction.  The  stomach was entered, and a small amount of liquid secretions were suctioned  from the fundus.  Retroflexed view of the cardia was unremarkable.  The  fundus and body appeared normal.  The antrum showed some mild patches of  erythema in the immediate prepyloric area, with no focal erosions or ulcers.  The pylorus was nondeformed and easily allowed passage of the endoscope tip  into the duodenum.  Both the bulb and the second portion were well inspected  and appeared to be within normal limits.  Biopsies were taken to rule out  celiac disease.  The scope was then withdrawn and the patient prepared for  colonoscopy.  She tolerated the procedure well, and there were no immediate  complications.   IMPRESSION:  Mild gastritis.  Otherwise normal study.   PLAN:  Await biopsy results, and will proceed with  colonoscopy.      JCH/MEDQ  D:  04/19/2004  T:  04/19/2004  Job:  [id number]   cc:   Beatrice Lecher, M.D.  [address] [place], [place] [address]  Fax: [phone]

## 2010-10-12 ENCOUNTER — Ambulatory Visit (INDEPENDENT_AMBULATORY_CARE_PROVIDER_SITE_OTHER): Payer: No Typology Code available for payment source | Admitting: Family Medicine

## 2010-10-12 ENCOUNTER — Encounter: Payer: Self-pay | Admitting: Family Medicine

## 2010-10-12 DIAGNOSIS — Z720 Tobacco use: Secondary | ICD-10-CM

## 2010-10-12 DIAGNOSIS — J449 Chronic obstructive pulmonary disease, unspecified: Secondary | ICD-10-CM

## 2010-10-12 DIAGNOSIS — I10 Essential (primary) hypertension: Secondary | ICD-10-CM

## 2010-10-12 DIAGNOSIS — F17210 Nicotine dependence, cigarettes, uncomplicated: Secondary | ICD-10-CM | POA: Insufficient documentation

## 2010-10-12 DIAGNOSIS — F17200 Nicotine dependence, unspecified, uncomplicated: Secondary | ICD-10-CM

## 2010-10-12 DIAGNOSIS — E119 Type 2 diabetes mellitus without complications: Secondary | ICD-10-CM

## 2010-10-12 LAB — POCT GLYCOSYLATED HEMOGLOBIN (HGB A1C): Hemoglobin A1C: 8.2

## 2010-10-12 MED ORDER — PIOGLITAZONE HCL 30 MG PO TABS: 30.0000 mg | ORAL_TABLET | Freq: Every day | ORAL | Status: DC

## 2010-10-12 MED ORDER — ALBUTEROL 90 MCG/ACT IN AERS: 2.0000 | INHALATION_SPRAY | Freq: Four times a day (QID) | RESPIRATORY_TRACT | Status: DC | PRN

## 2010-10-12 NOTE — Patient Instructions (Signed)
Stop the Januvia for now Restart the Actos at 21m daily and continue your glipizide twice daily. Continue your other meds I will refill your albuterol to have on hand Let's make a deal.  You are going to watch your diet and exercise and we will check your A1C gain in 3 months. If it goes up again we need to think about starting Insulin at that time.

## 2010-10-12 NOTE — Assessment & Plan Note (Signed)
At goal continue current treatment.

## 2010-10-12 NOTE — Assessment & Plan Note (Signed)
Discussed with pt again, she states she will try to quit on her own again, declined any resources we have available, did give her the quit line.

## 2010-10-12 NOTE — Assessment & Plan Note (Signed)
Pt has history COPD refilled albuterol, on Spriva, started smoking again told her this would not benefit her and that she really needs to quit.  Pt states she will try declined wanting to see Dr. Raymondo Band again for help.  Overall though very well controlled.

## 2010-10-12 NOTE — Progress Notes (Signed)
  Subjective:    Patient ID: Bonnie Peterson, female    DOB: [birth year], 65 y.o.   MRN: [id number]  Diabetes   1. Hypertension Blood pressure at home: 120-140 SBP when she checks about 1-2 times a week.  Blood pressure today: 113/70 Taking Meds:yes Side effects:no ROS: Denies headache visual changes nausea, vomiting, chest pain or abdominal pain or shortness of breath.   2. Diabetes:  High at home:225 Low at home:105 Taking medications: Pt was changed from actos to Januvia and her blood sugars seemed to worsen, Pt A1c did increase as well to 8.2 from 7.3.  Pt cannot tolerate metformin. Pt will not do insulin she states.  Side effects:no ROS: denies fever, chills, dizziness, loss of conscieness, polyuria poly dipsia numbness or tingling in extremities or chest pain.  Pt started to smoke again 1/2 ppd for the last 2 months pt would like to stop again but also states she has lost 10# and contributes it to smoking. Pt has COPD and has need to use her Albuterol some and is on spirva but no decrease in energy level, no fever no sputum production.   Review of Systems  Denies fever, chills, nausea vomiting abdominal pain, dysuria, chest pain, shortness of breath dyspnea on exertion or numbness in extremities Past medical history, social, surgical and family history all reviewed.      Objective:   Physical Exam vitals reviewed.   General Appearance:    Alert, cooperative, appears stated age  Head:    Normocephalic, without obvious abnormality, atraumatic  Eyes:    PERRL, conjunctiva/corneas clear, EOM's intact  Nose:   Nares normal, septum midline, mucosa normal, no drainage    or sinus tenderness  Throat:   Lips, mucosa, and tongue normal; teeth and gums normal  Lungs:     Clear to auscultation bilaterally, respirations unlabored   Heart:    Regular rate and rhythm, S1 and S2 normal, 1/6SEM murmur  Abdomen:     Soft, non-tender, bowel sounds active all four quadrants,    no masses, no  organomegaly  Extremities:   Extremities normal, atraumatic, no cyanosis trace edema  Pulses:   2+ and symmetric all extremities            Assessment & Plan:

## 2010-10-12 NOTE — Assessment & Plan Note (Signed)
Pt has worsening A1c, pt states she felt she was doing better on the Actos, discussed the risks with her again especially with her smoking and pt still wanted to switch at this time, pt will try to change diet and increase walking to help her as well.  Pt though understands if A1C is above 8 again in 3 months we will need to consider insulin therapy.

## 2010-11-21 ENCOUNTER — Other Ambulatory Visit: Payer: Self-pay | Admitting: Family Medicine

## 2010-11-21 DIAGNOSIS — Z1231 Encounter for screening mammogram for malignant neoplasm of breast: Secondary | ICD-10-CM

## 2011-01-04 ENCOUNTER — Ambulatory Visit (HOSPITAL_COMMUNITY)
Admission: RE | Admit: 2011-01-04 | Discharge: 2011-01-04 | Disposition: A | Discharge status: Home / Self Care | Payer: No Typology Code available for payment source | Source: Ambulatory Visit | Attending: Family Medicine | Admitting: Family Medicine

## 2011-01-04 DIAGNOSIS — Z1231 Encounter for screening mammogram for malignant neoplasm of breast: Secondary | ICD-10-CM | POA: Insufficient documentation

## 2011-01-15 ENCOUNTER — Ambulatory Visit: Payer: No Typology Code available for payment source | Admitting: Family Medicine

## 2011-02-06 ENCOUNTER — Ambulatory Visit (INDEPENDENT_AMBULATORY_CARE_PROVIDER_SITE_OTHER): Payer: No Typology Code available for payment source | Admitting: Family Medicine

## 2011-02-06 ENCOUNTER — Encounter: Payer: Self-pay | Admitting: Family Medicine

## 2011-02-06 VITALS — BP 135/81 | HR 76 | Temp 98.0°F | Ht 62.0 in | Wt 216.0 lb

## 2011-02-06 DIAGNOSIS — Z23 Encounter for immunization: Secondary | ICD-10-CM

## 2011-02-06 DIAGNOSIS — F17200 Nicotine dependence, unspecified, uncomplicated: Secondary | ICD-10-CM

## 2011-02-06 DIAGNOSIS — E119 Type 2 diabetes mellitus without complications: Secondary | ICD-10-CM

## 2011-02-06 DIAGNOSIS — J449 Chronic obstructive pulmonary disease, unspecified: Secondary | ICD-10-CM

## 2011-02-06 DIAGNOSIS — Z720 Tobacco use: Secondary | ICD-10-CM

## 2011-02-06 DIAGNOSIS — Z79899 Other long term (current) drug therapy: Secondary | ICD-10-CM

## 2011-02-06 DIAGNOSIS — I10 Essential (primary) hypertension: Secondary | ICD-10-CM

## 2011-02-06 LAB — POCT GLYCOSYLATED HEMOGLOBIN (HGB A1C): Hemoglobin A1C: 7.4

## 2011-02-06 MED ORDER — BENZONATATE 100 MG PO CAPS: 100.0000 mg | ORAL_CAPSULE | Freq: Three times a day (TID) | ORAL | Status: AC | PRN

## 2011-02-06 MED ORDER — TIOTROPIUM BROMIDE MONOHYDRATE 18 MCG IN CAPS: 1.0000 | ORAL_CAPSULE | Freq: Every day | RESPIRATORY_TRACT | Status: DC

## 2011-02-06 NOTE — Assessment & Plan Note (Signed)
Improvement in patient's A1c today.  We'll continue current treatment. Encouraged patient to increase her exercise as well as watch her diet.

## 2011-02-06 NOTE — Assessment & Plan Note (Addendum)
Patient is very stable with respiratory status at this time. We'll continue patient's Spirva as well as albuterol as needed. Refilled Spiriva today

## 2011-02-06 NOTE — Assessment & Plan Note (Signed)
Patient given option to stop smoking patient declined told her at any time we are here to help

## 2011-02-06 NOTE — Progress Notes (Signed)
  Subjective:    Patient ID: Bonnie Peterson, female    DOB: 07-17-1945, 65 y.o.   MRN: [id number]  HPI 1. Hypertension Blood pressure at home:not checking Blood pressure today: 135/80 Taking Meds: Yes Side effects: No ROS: Denies headache visual changes nausea, vomiting, chest pain or abdominal pain or shortness of breath.  2.  Diabetes:  High at home: 155 per patient Low at home: 135 per patient Taking medications: Yes including starting the Actos again patient did have the risks and benefits weighed and she wanted it. Side effects: No ROS: denies fever, chills, dizziness, loss of conscieness, polyuria poly dipsia numbness or tingling in extremities or chest pain.  3.  restrictive lung disease: Patient has a history of this as well as COPD. Patient has been doing very well she is only taking her spur Riva 3 times a week has no shortness of breath at rest or with activity. Patient states she only coughs from time to time and the medicine does help.  Preventative care: Patient has not had a colonoscopy in some time will be given information patient will also have flu shot today  Patient is continuing to smoke one pack per day and declines any help with needing to discuss quitting. Review of Systems As stated in the history of present illness. Past medical and surgical history reviewed with no changes    Objective:   Physical Exam vitals reviewed General Appearance:    Alert, cooperative, appears stated age  Head:    Normocephalic, without obvious abnormality, atraumatic  Eyes:    PERRL, conjunctiva/corneas clear, EOM's intact  Nose:   Nares normal, septum midline, mucosa normal, no drainage    or sinus tenderness  Throat:   Lips, mucosa, and tongue normal; teeth and gums normal  Lungs:     Clear to auscultation bilaterally, respirations unlabored   Heart:    Regular rate and rhythm, S1 and S2 normal, 1/6SEM murmur  Abdomen:     Soft, non-tender, bowel sounds active all four  quadrants,    no masses, no organomegaly  Extremities:   Extremities normal, atraumatic, no cyanosis trace edema  Pulses:   2+ and symmetric all extremities     Assessment & Plan:

## 2011-02-06 NOTE — Assessment & Plan Note (Signed)
Near goal we'll make no changes at this time patient encouraged to make lifestyle modifications

## 2011-02-06 NOTE — Patient Instructions (Signed)
It is very good to see you. I when she to come back and see me in 3 months. Lab Results  Component Value Date   HGBA1C 7.4 02/06/2011   your A1c has improved. Keep taking the medication as prescribed continue to try to exercise and watch her diet. I refilled some of her medications today [facility].

## 2011-04-10 ENCOUNTER — Other Ambulatory Visit: Payer: Self-pay | Admitting: Family Medicine

## 2011-04-10 NOTE — Telephone Encounter (Signed)
Refill request

## 2011-04-23 ENCOUNTER — Ambulatory Visit (INDEPENDENT_AMBULATORY_CARE_PROVIDER_SITE_OTHER): Payer: No Typology Code available for payment source | Admitting: Family Medicine

## 2011-04-23 ENCOUNTER — Encounter: Payer: Self-pay | Admitting: Family Medicine

## 2011-04-23 VITALS — BP 126/73 | HR 78 | Temp 98.2°F | Ht 62.0 in | Wt 206.1 lb

## 2011-04-23 DIAGNOSIS — J209 Acute bronchitis, unspecified: Secondary | ICD-10-CM

## 2011-04-23 DIAGNOSIS — J40 Bronchitis, not specified as acute or chronic: Secondary | ICD-10-CM

## 2011-04-23 MED ORDER — CEFTRIAXONE SODIUM 1 G IJ SOLR
1.0000 g | Freq: Once | INTRAMUSCULAR | Status: AC
  Administered 2011-04-23: 1 g via INTRAMUSCULAR

## 2011-04-23 MED ORDER — HYDROCOD POLST-CHLORPHEN POLST 10-8 MG/5ML PO LQCR: 5.0000 mL | Freq: Two times a day (BID) | ORAL | Status: DC | PRN

## 2011-04-23 MED ORDER — AZITHROMYCIN 500 MG PO TABS: 500.0000 mg | ORAL_TABLET | Freq: Every day | ORAL | Status: DC

## 2011-04-23 NOTE — Assessment & Plan Note (Signed)
Patient his findings is likely more of an acute bronchitis likely viral but with patient having COPD been increased risk am going to treat her. Patient given a shot of ceftriaxone today as well as azithromycin for the next 3 days. Patient given cough syrup patient given red flags as well as other information about bronchitis.

## 2011-04-23 NOTE — Progress Notes (Signed)
  Subjective:     Bonnie Peterson is a 66 y.o. female here for evaluation of a cough. Onset of symptoms was 1 month ago. Symptoms have been gradually worsening since that time. The cough is barky and productive of yellow sputum and is aggravated by cold air and infection. Associated symptoms include: chills, postnasal drip, sputum production and wheezing. Patient does not have a history of asthma. Patient does have a history of environmental allergens. Patient has not traveled recently. Patient does have a history of smoking. Patient has had a previous chest x-ray. Patient has had a PPD done.  The following portions of the patient's history were reviewed and updated as appropriate: allergies, current medications, past family history, past medical history, past social history, past surgical history and problem list.  Review of Systems Pertinent items are noted in HPI.   Denies weight loss Objective:    Oxygen saturation 94% on room air BP 126/73  Pulse 78  Temp(Src) 98.2 F (36.8 C) (Oral)  Ht 5\' 2"  (1.575 m)  Wt 206 lb 1.6 oz (93.486 kg)  BMI 37.70 kg/m2 General appearance: alert and cooperative Head: Normocephalic, without obvious abnormality, atraumatic Eyes: conjunctivae/corneas clear. PERRL, EOM's intact. Fundi benign., Mild peri-orbital swelling. Ears: normal TM's and external ear canals both ears Nose: moderate congestion, turbinates pink, swollen, sinus tenderness bilateral Throat: lips, mucosa, and tongue normal; teeth and gums normal and Postnasal drip Lungs: clear to auscultation bilaterally Heart: regular rate and rhythm, S1, S2 normal, no murmur, click, rub or gallop Pulses: 2+ and symmetric    Assessment:    Acute Bronchitis and Pneumonia    Plan:    Antibiotics per medication orders. Antitussives per medication orders. Avoid exposure to tobacco smoke and fumes. B-agonist inhaler. Call if shortness of breath worsens, blood in sputum, change in character of cough,  development of fever or chills, inability to maintain nutrition and hydration. Avoid exposure to tobacco smoke and fumes.

## 2011-04-23 NOTE — Patient Instructions (Addendum)
I am giving you cough medicine. I'm giving you an antibiotic called azithromycin to take one pill daily for 3 days. I want to see you back in one to 2 months to recheck your A1c. I hope you feel better.  Bronchitis Bronchitis is the body's way of reacting to injury and/or infection (inflammation) of the bronchi. Bronchi are the air tubes that extend from the windpipe into the lungs. If the inflammation becomes severe, it may cause shortness of breath. CAUSES  Inflammation may be caused by:  A virus.   Germs (bacteria).   Dust.   Allergens.   Pollutants and many other irritants.  The cells lining the bronchial tree are covered with tiny hairs (cilia). These constantly beat upward, away from the lungs, toward the mouth. This keeps the lungs free of pollutants. When these cells become too irritated and are unable to do their job, mucus begins to develop. This causes the characteristic cough of bronchitis. The cough clears the lungs when the cilia are unable to do their job. Without either of these protective mechanisms, the mucus would settle in the lungs. Then you would develop pneumonia. Smoking is a common cause of bronchitis and can contribute to pneumonia. Stopping this habit is the single most important thing you can do to help yourself. TREATMENT   Your caregiver may prescribe an antibiotic if the cough is caused by bacteria. Also, medicines that open up your airways make it easier to breathe. Your caregiver may also recommend or prescribe an expectorant. It will loosen the mucus to be coughed up. Only take over-the-counter or prescription medicines for pain, discomfort, or fever as directed by your caregiver.   Removing whatever causes the problem (smoking, for example) is critical to preventing the problem from getting worse.   Cough suppressants may be prescribed for relief of cough symptoms.   Inhaled medicines may be prescribed to help with symptoms now and to help prevent  problems from returning.   For those with recurrent (chronic) bronchitis, there may be a need for steroid medicines.  SEEK IMMEDIATE MEDICAL CARE IF:   During treatment, you develop more pus-like mucus (purulent sputum).   You have a fever.   Your baby is older than 3 months with a rectal temperature of 102 F (38.9 C) or higher.   Your baby is 27 months old or younger with a rectal temperature of 100.4 F (38 C) or higher.   You become progressively more ill.   You have increased difficulty breathing, wheezing, or shortness of breath.  It is necessary to seek immediate medical care if you are elderly or sick from any other disease. MAKE SURE YOU:   Understand these instructions.   Will watch your condition.   Will get help right away if you are not doing well or get worse.  Document Released: 03/25/2005 Document Revised: 12/05/2010 Document Reviewed: 02/02/2008 [facility] Patient Information 2012 [place].

## 2011-06-12 ENCOUNTER — Ambulatory Visit (INDEPENDENT_AMBULATORY_CARE_PROVIDER_SITE_OTHER): Payer: No Typology Code available for payment source | Admitting: Family Medicine

## 2011-06-12 ENCOUNTER — Encounter: Payer: Self-pay | Admitting: Family Medicine

## 2011-06-12 VITALS — BP 107/66 | HR 73 | Temp 97.9°F | Ht 62.0 in | Wt 213.0 lb

## 2011-06-12 DIAGNOSIS — N6459 Other signs and symptoms in breast: Secondary | ICD-10-CM

## 2011-06-12 DIAGNOSIS — E119 Type 2 diabetes mellitus without complications: Secondary | ICD-10-CM

## 2011-06-12 DIAGNOSIS — E669 Obesity, unspecified: Secondary | ICD-10-CM

## 2011-06-12 DIAGNOSIS — I10 Essential (primary) hypertension: Secondary | ICD-10-CM

## 2011-06-12 DIAGNOSIS — N644 Mastodynia: Secondary | ICD-10-CM

## 2011-06-12 DIAGNOSIS — E785 Hyperlipidemia, unspecified: Secondary | ICD-10-CM

## 2011-06-12 LAB — COMPREHENSIVE METABOLIC PANEL
ALT: 18 U/L (ref 0–35)
AST: 16 U/L (ref 0–37)
Albumin: 4.2 g/dL (ref 3.5–5.2)
Alkaline Phosphatase: 115 U/L (ref 39–117)
BUN: 11 mg/dL (ref 6–23)
CO2: 27 mEq/L (ref 19–32)
Calcium: 9.7 mg/dL (ref 8.4–10.5)
Chloride: 102 mEq/L (ref 96–112)
Creat: 0.79 mg/dL (ref 0.50–1.10)
Glucose, Bld: 134 mg/dL — ABNORMAL HIGH (ref 70–99)
Potassium: 4.3 mEq/L (ref 3.5–5.3)
Sodium: 139 mEq/L (ref 135–145)
Total Bilirubin: 0.3 mg/dL (ref 0.3–1.2)
Total Protein: 7.1 g/dL (ref 6.0–8.3)

## 2011-06-12 LAB — LIPID PANEL
Cholesterol: 145 mg/dL (ref 0–200)
HDL: 28 mg/dL — ABNORMAL LOW (ref >39)
LDL Cholesterol: 78 mg/dL (ref 0–99)
Total CHOL/HDL Ratio: 5.2 Ratio
Triglycerides: 193 mg/dL — ABNORMAL HIGH (ref <150)
VLDL: 39 mg/dL (ref 0–40)

## 2011-06-12 LAB — POCT GLYCOSYLATED HEMOGLOBIN (HGB A1C): Hemoglobin A1C: 7.1

## 2011-06-12 MED ORDER — HYDROCORTISONE 2.5 % EX CREA: TOPICAL_CREAM | Freq: Two times a day (BID) | CUTANEOUS | Status: AC

## 2011-06-12 NOTE — Patient Instructions (Addendum)
It is good to see you. I will call you with your lab results. You are due for colonoscopy in should consider it. I when she to deteriorate putting lotion on her nipple 3 times a day. Also try to apply the hydrocortisone once to twice daily for the next couple weeks. I when she to come back and see me in 3 months

## 2011-06-12 NOTE — Progress Notes (Signed)
  Subjective:    Patient ID: Bonnie Peterson, female    DOB: 30-Jan-1946, 66 y.o.   MRN: [id number]  HPI  1. Hypertension Blood pressure at home:not checking Blood pressure today: 107/66 Taking Meds: Yes Side effects: No ROS: Denies headache visual changes nausea, vomiting, chest pain or abdominal pain or shortness of breath.  2.  Diabetes:  High at home: 146 Low at home: 106 per patient Taking medications: Yes including starting the Actos again patient did have the risks and benefits weighed and she wanted it. Side effects: No ROS: denies fever, chills, dizziness, loss of conscieness, polyuria poly dipsia numbness or tingling in extremities or chest pain. Lab Results  Component Value Date   HGBA1C 7.1 06/12/2011     3.  patient also states that she is having some soreness of her left nipple. Patient states that this occurs anytime she takes a shower. Patient denies any skin breakdown that she conceives but does feel rough per her description. Patient denies any discharge or any erythema. Patient did have a mammogram done in September which was normal. Patient denies any fevers, chills, weight loss out of the ordinary. No masses or lump palpated per patient  Preventative care: Patient has not had a colonoscopy in some time will be given information patient will also have flu shot today  Patient is continuing to smoke one pack per day and declines any help with needing to discuss quitting. Review of Systems  As stated in the history of present illness. Past medical and surgical history reviewed with no changes    Objective:   Physical Exam  vitals reviewed General Appearance:    Alert, cooperative, appears stated age  Head:    Normocephalic, without obvious abnormality, atraumatic  Eyes:    PERRL, conjunctiva/corneas clear, EOM's intact Chest exam: Patient's left nipple does have some dry eczema of the superior portion with hypertrophy of the skin secondary to the dryness. Patient  is sensitive but nontender no erythema no skin breakdown.   Nose:   Nares normal, septum midline, mucosa normal, no drainage    or sinus tenderness  Throat:   Lips, mucosa, and tongue normal; teeth and gums normal  Lungs:     Clear to auscultation bilaterally, respirations unlabored   Heart:    Regular rate and rhythm, S1 and S2 normal, 1/6SEM murmur  Abdomen:     Soft, non-tender, bowel sounds active all four quadrants,    no masses, no organomegaly  Extremities:   Extremities normal, atraumatic, no cyanosis trace edema  Pulses:   2+ and symmetric all extremities     Assessment & Plan:

## 2011-06-12 NOTE — Assessment & Plan Note (Signed)
Filed Weights   06/12/11 1128  Weight: 213 lb (96.616 kg)   Body mass index is 38.96 kg/(m^2). Continue to monitor. Encourage patient to increase activity and watch weight with nutrition.

## 2011-06-12 NOTE — Assessment & Plan Note (Signed)
At goal at this time we'll make no changes. Patient is due for complete metabolic panel today. Will call patient with results.

## 2011-06-12 NOTE — Assessment & Plan Note (Signed)
We have attempted to change her medications in the past. Attempted to grab Actos the patient's A1c did become elevated the patient would rather be on this medication. Patient's A1c down to 7.1 from 8.3 Previously Discussed risks benefits of Actos again she would like to continue the medication.

## 2011-06-12 NOTE — Assessment & Plan Note (Signed)
Patient appears to have some eczema on the nipple. At this point will try some hydrocortisone continue doing lotion 3 times daily and we'll look for improvement. Patient given red flags and what to look out for and when to seek medical attention. Patient will followup as needed

## 2011-08-27 ENCOUNTER — Ambulatory Visit: Payer: No Typology Code available for payment source | Admitting: Family Medicine

## 2011-09-14 ENCOUNTER — Encounter: Payer: Self-pay | Admitting: Family Medicine

## 2011-09-18 ENCOUNTER — Encounter: Payer: Self-pay | Admitting: Family Medicine

## 2011-09-18 ENCOUNTER — Ambulatory Visit (INDEPENDENT_AMBULATORY_CARE_PROVIDER_SITE_OTHER): Payer: No Typology Code available for payment source | Admitting: Family Medicine

## 2011-09-18 VITALS — BP 119/70 | HR 73 | Temp 98.6°F | Ht 62.0 in | Wt 218.0 lb

## 2011-09-18 DIAGNOSIS — I10 Essential (primary) hypertension: Secondary | ICD-10-CM

## 2011-09-18 DIAGNOSIS — E119 Type 2 diabetes mellitus without complications: Secondary | ICD-10-CM

## 2011-09-18 DIAGNOSIS — Z720 Tobacco use: Secondary | ICD-10-CM

## 2011-09-18 DIAGNOSIS — F17200 Nicotine dependence, unspecified, uncomplicated: Secondary | ICD-10-CM

## 2011-09-18 LAB — POCT GLYCOSYLATED HEMOGLOBIN (HGB A1C): Hemoglobin A1C: 7.4

## 2011-09-18 MED ORDER — PNEUMOCOCCAL VAC POLYVALENT 25 MCG/0.5ML IJ INJ: 0.5000 mL | INJECTION | Freq: Once | INTRAMUSCULAR | Status: AC

## 2011-09-18 MED ORDER — ZOSTER VACCINE LIVE 19400 UNT/0.65ML ~~LOC~~ SOLR: 0.6500 mL | Freq: Once | SUBCUTANEOUS | Status: AC

## 2011-09-18 MED ORDER — COLCHICINE 0.6 MG PO TABS: 0.6000 mg | ORAL_TABLET | Freq: Every day | ORAL | Status: DC

## 2011-09-18 NOTE — Assessment & Plan Note (Signed)
Patient's A1c did increase slightly. Patient has made plan with me that his A1c goes above 8.5 she would consider going on insulin. Encourage patient to watch her diet and exercise more closely. Followup again in 3 months.

## 2011-09-18 NOTE — Progress Notes (Signed)
Patient ID: Bonnie Peterson, female   DOB: 1945/07/21, 66 y.o.   MRN: [id number]  Subjective:    Patient ID: Bonnie Peterson, female    DOB: 1945/08/10, 66 y.o.   MRN: [id number]  Diabetes   1. Hypertension Blood pressure at home:not checking Blood pressure today: 119/70 Taking Meds: Yes Side effects: No ROS: Denies headache visual changes nausea, vomiting, chest pain or abdominal pain or shortness of breath.  2.  Diabetes:  High at home: 185 Low at home: 104 Taking medications: Yes including starting the Actos again patient did have the risks and benefits weighed and she wanted it. Side effects: No ROS: denies fever, chills, dizziness, loss of conscieness, polyuria poly dipsia numbness or tingling in extremities or chest pain. Lab Results  Component Value Date   HGBA1C 7.4 09/18/2011      Preventative care: Patient has not had a colonoscopy in some time will be given information patient patient has wanted a Zostavax and Pneumovax as well.  Patient is continuing to smoke 1/2  pack per day and declines any help with needing to discuss quitting. Review of Systems As stated in the history of present illness. Past medical and surgical history reviewed with no changes    Objective:   Physical Exam vitals reviewed General Appearance:    Alert, cooperative, appears stated age  Head:    Normocephalic, without obvious abnormality, atraumatic  Eyes:    PERRL, conjunctiva/corneas clear, EOM's intact Chest exam: Patient's left nipple does have some dry eczema of the superior portion with hypertrophy of the skin secondary to the dryness. Patient is sensitive but nontender no erythema no skin breakdown.   Nose:   Nares normal, septum midline, mucosa normal, no drainage    or sinus tenderness  Throat:   Lips, mucosa, and tongue normal; teeth and gums normal  Lungs:     Clear to auscultation bilaterally, respirations unlabored   Heart:    Regular rate and rhythm, S1 and S2 normal, 1/6SEM  murmur  Abdomen:     Soft, non-tender, bowel sounds active all four quadrants,    no masses, no organomegaly  Extremities:   Extremities normal, atraumatic, no cyanosis trace edema  Pulses:   2+ and symmetric all extremities     Assessment & Plan:

## 2011-09-18 NOTE — Assessment & Plan Note (Signed)
Patient is still smoking encouraged to stop smoking the patient says no. Patient declined counseling.

## 2011-09-18 NOTE — Assessment & Plan Note (Signed)
At goal no changes.

## 2011-09-18 NOTE — Patient Instructions (Addendum)
It is great to see you Your A1c went up a little bit, but diet and exercise can help Lab Results  Component Value Date   HGBA1C 7.4 09/18/2011  I will check vitamin d today and I will call you with the results.   Please call  Dr. Jenne Campus for an appointment.  I will give you her card again. I refilled you gout medicine and the vaccine.  Please make a nurse appointment when you get the vaccine

## 2011-09-19 ENCOUNTER — Telehealth: Payer: Self-pay | Admitting: Family Medicine

## 2011-09-19 LAB — TSH: TSH: 1.295 u[IU]/mL (ref 0.350–4.500)

## 2011-09-19 LAB — VITAMIN D 25 HYDROXY (VIT D DEFICIENCY, FRACTURES): Vit D, 25-Hydroxy: 17 ng/mL — ABNORMAL LOW (ref 30–89)

## 2011-09-19 MED ORDER — ERGOCALCIFEROL 1.25 MG (50000 UT) PO CAPS: 50000.0000 [IU] | ORAL_CAPSULE | ORAL | Status: DC

## 2011-09-19 NOTE — Telephone Encounter (Signed)
Called pt LVM about labs and will supplement calcium and vitamin D with ergocalciferol 1 pill weekly for 12 weeks.

## 2011-10-16 ENCOUNTER — Encounter: Payer: Self-pay | Admitting: Family Medicine

## 2011-10-16 ENCOUNTER — Ambulatory Visit (INDEPENDENT_AMBULATORY_CARE_PROVIDER_SITE_OTHER): Payer: No Typology Code available for payment source | Admitting: Family Medicine

## 2011-10-16 VITALS — BP 154/74 | HR 74 | Temp 97.7°F | Ht 62.0 in | Wt 220.1 lb

## 2011-10-16 DIAGNOSIS — L723 Sebaceous cyst: Secondary | ICD-10-CM

## 2011-10-16 NOTE — Progress Notes (Signed)
  Subjective:    Patient ID: Bonnie Peterson, female    DOB: Jan 04, 1946, 66 y.o.   MRN: [id number]  HPI Has found a knot on the top of her head and a small knot at the base of her posterior left neck. Noticed him several days ago and she is quite worried about what they mean. She's also noticed that she's been a little more fatigued in the last 2-3 days and wonders if this is related. Has not had a new hair products, no head trauma.   Review of Systems Denies headache. Denies visual changes.    Objective:   Physical Exam  GENERAL: Well-developed female no acute distress NECK: Supple. Small lymph node that is mildly tender to palpation about a half centimeter in diameter at the base of the left posterior neck. SCALP: There is a 1 cm slightly fluctuant mass on the vertex of the scalp. No skin changes noted around. There is no exudate.      Assessment & Plan:  #1. Sebaceous cyst on the scalp. Lymph node at his reactive in nature and may or may not be associated with the sebaceous cyst. I reassured her we discussed options including removal of the it is not particularly tender, is not draining so I think she is to just watch it. Regarding the lymph node I think it would be safe to watch that for 4-6 weeks lungs he continues to resolve. It does not she needs to return for followup.sebaceous cyst. This time

## 2011-11-27 ENCOUNTER — Other Ambulatory Visit: Payer: Self-pay | Admitting: Family Medicine

## 2011-11-27 DIAGNOSIS — Z1231 Encounter for screening mammogram for malignant neoplasm of breast: Secondary | ICD-10-CM

## 2011-12-23 ENCOUNTER — Ambulatory Visit (INDEPENDENT_AMBULATORY_CARE_PROVIDER_SITE_OTHER): Payer: No Typology Code available for payment source | Admitting: Family Medicine

## 2011-12-23 ENCOUNTER — Encounter: Payer: Self-pay | Admitting: Family Medicine

## 2011-12-23 VITALS — BP 127/75 | HR 71 | Temp 97.7°F | Ht 62.0 in | Wt 220.0 lb

## 2011-12-23 DIAGNOSIS — I10 Essential (primary) hypertension: Secondary | ICD-10-CM

## 2011-12-23 DIAGNOSIS — Z720 Tobacco use: Secondary | ICD-10-CM

## 2011-12-23 DIAGNOSIS — L720 Epidermal cyst: Secondary | ICD-10-CM

## 2011-12-23 DIAGNOSIS — G4733 Obstructive sleep apnea (adult) (pediatric): Secondary | ICD-10-CM

## 2011-12-23 DIAGNOSIS — E559 Vitamin D deficiency, unspecified: Secondary | ICD-10-CM

## 2011-12-23 DIAGNOSIS — Z23 Encounter for immunization: Secondary | ICD-10-CM

## 2011-12-23 DIAGNOSIS — L723 Sebaceous cyst: Secondary | ICD-10-CM

## 2011-12-23 DIAGNOSIS — E119 Type 2 diabetes mellitus without complications: Secondary | ICD-10-CM

## 2011-12-23 DIAGNOSIS — F17200 Nicotine dependence, unspecified, uncomplicated: Secondary | ICD-10-CM

## 2011-12-23 LAB — POCT GLYCOSYLATED HEMOGLOBIN (HGB A1C): Hemoglobin A1C: 7.1

## 2011-12-23 MED ORDER — GLIPIZIDE 10 MG PO TABS: 20.0000 mg | ORAL_TABLET | Freq: Two times a day (BID) | ORAL | Status: DC

## 2011-12-23 MED ORDER — PIOGLITAZONE HCL 30 MG PO TABS: 30.0000 mg | ORAL_TABLET | Freq: Every day | ORAL | Status: DC

## 2011-12-23 MED ORDER — ERGOCALCIFEROL 1.25 MG (50000 UT) PO CAPS: 50000.0000 [IU] | ORAL_CAPSULE | ORAL | Status: DC

## 2011-12-23 NOTE — Assessment & Plan Note (Signed)
BP well controlled. Pt unaware of nighttime behavior. Has not worn CPAP in over 54yr May continue w/o at this time.

## 2011-12-23 NOTE — Assessment & Plan Note (Signed)
Inflammation resolved as pt has stopped picking at it.  Reasons for removal/treatment discussed. Pt amenable to wait and watch

## 2011-12-23 NOTE — Assessment & Plan Note (Signed)
No labs today States she does not feel a difference Recheck level in 3 mo Refill Ergocalciferol

## 2011-12-23 NOTE — Assessment & Plan Note (Signed)
Continue current therapy. No change. BP at goal

## 2011-12-23 NOTE — Patient Instructions (Signed)
Thank you for coming in today. You are doing great Please start taking your Glipizide twice a day Please start using your nicotine patches every day to replace your cigarette use.  Continue taking your vitamin D medicine and come see me in December to get your levels checked Have a great day.  I have sent your prescriptions over to the pharmacy

## 2011-12-23 NOTE — Assessment & Plan Note (Signed)
Spent greater than 5 min discussing methods for quitting (breaking the habit of smoking and replacing/weaning off the chemical dependence) Pt to start nicotine patches.

## 2011-12-23 NOTE — Progress Notes (Signed)
  Subjective:    Patient ID: Bonnie Peterson, female    DOB: 1945/12/07, 66 y.o.   MRN: [id number]  HPI CC: complete phsysical  DM: Bs at home 130s to 140s. Denies any lightheadedness, dissiness, syncope, tremor, HA, N/v/d/c. Unable to use metformin due to GI upset.  Vit D: Taking the ergocalciferol wkly. No change in energy level or overall mood. Does not want to get stuck again today for labs. Denis any palpitations, muscle cramping/twitching, urinary or abd complaints  OSA: Has stopped using CPAP for the past year. Denies waking up tired or w/ HA.   Tobacco abuse: Continues to smoke 1ppd. Has tried chantix in the past and said it made her have to go to hospital. WIlling to try other products. Denies any SOB, fever, wt loss  Sebaceous cyst: Resolving. No longer picking at it. Educated on when to return for evaluation and removal  HTN: Taking Rx as prescribed. Denies HA, palpitaitons, syncope, cp  Past medical/surgical, family, and social histories reviewed   Review of Systems Per hpi    Objective:   Physical Exam Gen: Obese Res: CTAB, normal effort CV: RRR, no m/r/g         Assessment & Plan:

## 2011-12-23 NOTE — Assessment & Plan Note (Addendum)
Increase Glipizide to 16m BID from Qday. Risks benefits discussed.  Recommend liver panel at next appt Will discuss efficacy adn safety of Actos again at next appt

## 2012-01-06 ENCOUNTER — Ambulatory Visit (HOSPITAL_COMMUNITY): Payer: No Typology Code available for payment source

## 2012-01-07 DIAGNOSIS — H269 Unspecified cataract: Secondary | ICD-10-CM

## 2012-01-07 HISTORY — DX: Unspecified cataract: H26.9

## 2012-01-25 ENCOUNTER — Encounter: Payer: Self-pay | Admitting: Family Medicine

## 2012-01-28 ENCOUNTER — Ambulatory Visit (HOSPITAL_COMMUNITY)
Admission: RE | Admit: 2012-01-28 | Discharge: 2012-01-28 | Disposition: A | Discharge status: Home / Self Care | Payer: No Typology Code available for payment source | Source: Ambulatory Visit | Attending: Family Medicine | Admitting: Family Medicine

## 2012-01-28 DIAGNOSIS — Z1231 Encounter for screening mammogram for malignant neoplasm of breast: Secondary | ICD-10-CM | POA: Insufficient documentation

## 2012-03-12 ENCOUNTER — Ambulatory Visit (INDEPENDENT_AMBULATORY_CARE_PROVIDER_SITE_OTHER): Payer: No Typology Code available for payment source | Admitting: Family Medicine

## 2012-03-12 ENCOUNTER — Encounter: Payer: Self-pay | Admitting: Family Medicine

## 2012-03-12 VITALS — BP 144/74 | HR 72 | Temp 98.3°F | Ht 62.0 in | Wt 222.0 lb

## 2012-03-12 DIAGNOSIS — E785 Hyperlipidemia, unspecified: Secondary | ICD-10-CM

## 2012-03-12 DIAGNOSIS — L720 Epidermal cyst: Secondary | ICD-10-CM

## 2012-03-12 DIAGNOSIS — Z720 Tobacco use: Secondary | ICD-10-CM

## 2012-03-12 DIAGNOSIS — E119 Type 2 diabetes mellitus without complications: Secondary | ICD-10-CM

## 2012-03-12 DIAGNOSIS — I10 Essential (primary) hypertension: Secondary | ICD-10-CM

## 2012-03-12 DIAGNOSIS — L723 Sebaceous cyst: Secondary | ICD-10-CM

## 2012-03-12 DIAGNOSIS — E559 Vitamin D deficiency, unspecified: Secondary | ICD-10-CM

## 2012-03-12 DIAGNOSIS — F17200 Nicotine dependence, unspecified, uncomplicated: Secondary | ICD-10-CM

## 2012-03-12 LAB — COMPREHENSIVE METABOLIC PANEL
ALT: 12 U/L (ref 0–35)
AST: 14 U/L (ref 0–37)
Albumin: 4 g/dL (ref 3.5–5.2)
Alkaline Phosphatase: 129 U/L — ABNORMAL HIGH (ref 39–117)
BUN: 13 mg/dL (ref 6–23)
CO2: 26 mEq/L (ref 19–32)
Calcium: 9.7 mg/dL (ref 8.4–10.5)
Chloride: 101 mEq/L (ref 96–112)
Creat: 0.7 mg/dL (ref 0.50–1.10)
Glucose, Bld: 186 mg/dL — ABNORMAL HIGH (ref 70–99)
Potassium: 4.2 mEq/L (ref 3.5–5.3)
Sodium: 137 mEq/L (ref 135–145)
Total Bilirubin: 0.4 mg/dL (ref 0.3–1.2)
Total Protein: 6.9 g/dL (ref 6.0–8.3)

## 2012-03-12 LAB — POCT GLYCOSYLATED HEMOGLOBIN (HGB A1C): Hemoglobin A1C: 7.5

## 2012-03-12 LAB — LDL CHOLESTEROL, DIRECT: Direct LDL: 78 mg/dL

## 2012-03-12 MED ORDER — LIDOCAINE HCL 2 % EX GEL: CUTANEOUS | Status: DC | PRN

## 2012-03-12 MED ORDER — IBUPROFEN 600 MG PO TABS: 600.0000 mg | ORAL_TABLET | Freq: Four times a day (QID) | ORAL | Status: DC | PRN

## 2012-03-12 NOTE — Assessment & Plan Note (Signed)
Likely inflamed and not infected Will treat w/ topical anesthetic adn Ibuprofen Will evaluate in 1 wk. If still inflamed and no evidence of infection will perform steroid injection Plan for excision after inflammation resolves

## 2012-03-12 NOTE — Assessment & Plan Note (Signed)
Recheck Vit D today. 

## 2012-03-12 NOTE — Assessment & Plan Note (Signed)
Direct LDL today Continue statin

## 2012-03-12 NOTE — Assessment & Plan Note (Signed)
Discussed quitting again Provided handout for [place] quitline Pt to start nicotine patches when can afford

## 2012-03-12 NOTE — Patient Instructions (Addendum)
Thank you for coming in today Please start taking the ibuprofen for your head pain Please use the lidocaine gel on your scalp as needed for pain Please come back to see me next week to review your labs and if the spot on your head is not better we will inject it with a steroid Sometime in the future we will plan to cut it out. Have a great day.  Epidermal Cyst An epidermal cyst is sometimes called a sebaceous cyst, epidermal inclusion cyst, or infundibular cyst. These cysts usually contain a substance that looks "pasty" or "cheesy" and may have a bad smell. This substance is a protein called keratin. Epidermal cysts are usually found on the face, neck, or trunk. They may also occur in the vaginal area or other parts of the genitalia of both men and women. Epidermal cysts are usually small, painless, slow-growing bumps or lumps that move freely under the skin. It is important not to try to pop them. This may cause an infection and lead to tenderness and swelling. CAUSES  Epidermal cysts may be caused by a deep penetrating injury to the skin or a plugged hair follicle, often associated with acne. SYMPTOMS  Epidermal cysts can become inflamed and cause:  Redness.  Tenderness.  Increased temperature of the skin over the bumps or lumps.  Grayish-white, bad smelling material that drains from the bump or lump. DIAGNOSIS  Epidermal cysts are easily diagnosed by your caregiver during an exam. Rarely, a tissue sample (biopsy) may be taken to rule out other conditions that may resemble epidermal cysts. TREATMENT   Epidermal cysts often get better and disappear on their own. They are rarely ever cancerous.  If a cyst becomes infected, it may become inflamed and tender. This may require opening and draining the cyst. Treatment with antibiotics may be necessary. When the infection is gone, the cyst may be removed with minor surgery.  Small, inflamed cysts can often be treated with antibiotics or by  injecting steroid medicines.  Sometimes, epidermal cysts become large and bothersome. If this happens, surgical removal in your caregiver's office may be necessary. HOME CARE INSTRUCTIONS  Only take over-the-counter or prescription medicines as directed by your caregiver.  Take your antibiotics as directed. Finish them even if you start to feel better. SEEK MEDICAL CARE IF:   Your cyst becomes tender, red, or swollen.  Your condition is not improving or is getting worse.  You have any other questions or concerns. MAKE SURE YOU:  Understand these instructions.  Will watch your condition.  Will get help right away if you are not doing well or get worse. Document Released: 02/24/2004 Document Revised: 06/17/2011 Document Reviewed: 10/01/2010 [facility] Patient Information 2013 [place].

## 2012-03-12 NOTE — Assessment & Plan Note (Addendum)
A1c: 7.5 today Continue glipizide and actos Pt really wants to avoid insulin and aware of dangers of actos CMET today

## 2012-03-12 NOTE — Progress Notes (Signed)
Bonnie Peterson is a 66 y.o. female who presents to [facility] today for painful bump on head  Bump on head: Initially presented to [facility] in June fpr painful bump. Since that time lump has grown in size and became painful 2wks ago. Denies purulent DC, fever, rash, skin breakdown. Cannot lay on that side due to pain on even light touch.   Vit D: Finished previous Rx. Still w/ low energy  HTN: Denies CP, HA, lightheadedness, palpitaitons, syncope. Taking meds as Rx. In pain today  Tobacco abuse: continues to smoke 1/2ppd. Unable to fill nicotine patch Rx yet as pt w/ little income.   DM: BS 120-140 at home. Has been taking incrased dose of glipizide.  The following portions of the patient's history were reviewed and updated as appropriate: allergies, current medications, past medical history, family and social history, and problem list.  Patient is a smoker  Past Medical History  Diagnosis Date  . Obstructive sleep apnea   . HTN, goal below 130/80   . HLD (hyperlipidemia)   . Benign positional vertigo   . Depression   . Anxiety   . Osteoarthritis (arthritis due to wear and tear of joints)   . Irritable bowel syndrome   . Obesity, Class III, BMI 40-49.9 (morbid obesity)   . Gout   . Restrictive lung disease   . Echocardiogram findings abnormal, without diagnosis 10/10    10/10: mild pulm HTN, EF 60-65%, mild LVH, moderate aortic regurg  . Early cataracts, bilateral 10/13    Optho, Dr Gershon Crane    ROS as above otherwise neg.    Medications reviewed. Current Outpatient Prescriptions  Medication Sig Dispense Refill  . albuterol (PROVENTIL,VENTOLIN) 90 MCG/ACT inhaler Inhale 2 puffs into the lungs every 6 (six) hours as needed for wheezing or shortness of breath.  17 g  12  . allopurinol (ZYLOPRIM) 300 MG tablet Take 300 mg by mouth daily.        [name] amLODipine (NORVASC) 10 MG tablet Take 10 mg by mouth daily.        [name] aspirin 81 MG chewable tablet Chew 81 mg by mouth daily.        [name] atenolol  (TENORMIN) 50 MG tablet TAKE 1 TABLET EACH DAY  180 tablet  1  . atorvastatin (LIPITOR) 10 MG tablet Take 10 mg by mouth daily.        . colchicine 0.6 MG tablet Take 1 tablet (0.6 mg total) by mouth daily.  90 tablet  3  . cyclobenzaprine (FLEXERIL) 5 MG tablet Take 5 mg by mouth daily as needed.        . dicyclomine (BENTYL) 20 MG tablet Take 20 mg by mouth 4 (four) times daily.        . enalapril (VASOTEC) 20 MG tablet Take 1 tablet (20 mg total) by mouth daily.  90 tablet  1  . ergocalciferol (VITAMIN D2) 50000 UNITS capsule Take 1 capsule (50,000 Units total) by mouth once a week.  12 capsule  1  . fluticasone (FLONASE) 50 MCG/ACT nasal spray 1 spray by Nasal route daily.        [name] glipiZIDE (GLUCOTROL) 10 MG tablet Take 2 tablets (20 mg total) by mouth 2 (two) times daily before a meal.  120 tablet  11  . hydrochlorothiazide 25 MG tablet Take 25 mg by mouth daily.        . hydrocortisone 2.5 % cream Apply topically 2 (two) times daily.  30 g  1  .  ibuprofen (ADVIL,MOTRIN) 600 MG tablet Take 1 tablet (600 mg total) by mouth every 6 (six) hours as needed for pain.  30 tablet  0  . lidocaine (XYLOCAINE) 2 % jelly Apply topically as needed.  30 mL  0  . loratadine (CLARITIN) 10 MG tablet Take 10 mg by mouth daily.        . nitroGLYCERIN (NITROSTAT) 0.4 MG SL tablet Place 0.4 mg under the tongue every 5 (five) minutes as needed.        . pioglitazone (ACTOS) 30 MG tablet Take 1 tablet (30 mg total) by mouth daily.  30 tablet  11  . pneumococcal 23 valent vaccine (PNU-IMMUNE) 25 MCG/0.5ML injection Inject 0.5 mLs into the muscle once.  2.5 mL  0  . tiotropium (SPIRIVA HANDIHALER) 18 MCG inhalation capsule Place 1 capsule (18 mcg total) into inhaler and inhale daily.  90 capsule  1  . traZODone (DESYREL) 50 MG tablet Take 50 mg by mouth at bedtime.          Exam: BP 144/74  Pulse 72  Temp 98.3 F (36.8 C) (Oral)  Ht 5' 2"  (1.575 m)  Wt 222 lb (100.699 kg)  BMI 40.60 kg/m2 Gen: Well  NAD HEENT: 1.5x1.5cm raised mobile lesion of the posterior L scalp that is painful to palpation. No erythem or purulent discharge.  Results for orders placed in visit on 03/12/12 (from the past 72 hour(s))  POCT GLYCOSYLATED HEMOGLOBIN (HGB A1C)     Status: Normal   Collection Time   03/12/12 10:38 AM      Component Value Range Comment   Hemoglobin A1C 7.5

## 2012-03-12 NOTE — Assessment & Plan Note (Signed)
Elevated today byut in pain Continue current therapy

## 2012-03-13 LAB — VITAMIN D 25 HYDROXY (VIT D DEFICIENCY, FRACTURES): Vit D, 25-Hydroxy: 32 ng/mL (ref 30–89)

## 2012-03-17 ENCOUNTER — Encounter: Payer: Self-pay | Admitting: Family Medicine

## 2012-03-17 ENCOUNTER — Ambulatory Visit (INDEPENDENT_AMBULATORY_CARE_PROVIDER_SITE_OTHER): Payer: No Typology Code available for payment source | Admitting: Family Medicine

## 2012-03-17 VITALS — BP 135/75 | HR 71 | Temp 98.2°F | Ht 62.0 in | Wt 233.6 lb

## 2012-03-17 DIAGNOSIS — I10 Essential (primary) hypertension: Secondary | ICD-10-CM

## 2012-03-17 DIAGNOSIS — G4733 Obstructive sleep apnea (adult) (pediatric): Secondary | ICD-10-CM

## 2012-03-17 DIAGNOSIS — Z720 Tobacco use: Secondary | ICD-10-CM

## 2012-03-17 DIAGNOSIS — L723 Sebaceous cyst: Secondary | ICD-10-CM

## 2012-03-17 DIAGNOSIS — E785 Hyperlipidemia, unspecified: Secondary | ICD-10-CM

## 2012-03-17 DIAGNOSIS — E119 Type 2 diabetes mellitus without complications: Secondary | ICD-10-CM

## 2012-03-17 DIAGNOSIS — F17200 Nicotine dependence, unspecified, uncomplicated: Secondary | ICD-10-CM

## 2012-03-17 DIAGNOSIS — J449 Chronic obstructive pulmonary disease, unspecified: Secondary | ICD-10-CM

## 2012-03-17 DIAGNOSIS — L720 Epidermal cyst: Secondary | ICD-10-CM

## 2012-03-17 MED ORDER — AMLODIPINE BESYLATE 10 MG PO TABS: 10.0000 mg | ORAL_TABLET | Freq: Every day | ORAL | Status: DC

## 2012-03-17 MED ORDER — ALLOPURINOL 300 MG PO TABS: 300.0000 mg | ORAL_TABLET | Freq: Every day | ORAL | Status: DC

## 2012-03-17 MED ORDER — TIOTROPIUM BROMIDE MONOHYDRATE 18 MCG IN CAPS: 1.0000 | ORAL_CAPSULE | Freq: Every day | RESPIRATORY_TRACT | Status: DC

## 2012-03-17 MED ORDER — ATENOLOL 50 MG PO TABS: 50.0000 mg | ORAL_TABLET | Freq: Every day | ORAL | Status: DC

## 2012-03-17 MED ORDER — HYDROCHLOROTHIAZIDE 25 MG PO TABS: 25.0000 mg | ORAL_TABLET | Freq: Every day | ORAL | Status: DC

## 2012-03-17 MED ORDER — COLCHICINE 0.6 MG PO TABS: 0.6000 mg | ORAL_TABLET | Freq: Every day | ORAL | Status: DC

## 2012-03-17 MED ORDER — GLIPIZIDE 10 MG PO TABS: 20.0000 mg | ORAL_TABLET | Freq: Two times a day (BID) | ORAL | Status: DC

## 2012-03-17 MED ORDER — FLUTICASONE PROPIONATE 50 MCG/ACT NA SUSP: 1.0000 | Freq: Every day | NASAL | Status: DC

## 2012-03-17 MED ORDER — DICYCLOMINE HCL 20 MG PO TABS: 20.0000 mg | ORAL_TABLET | Freq: Four times a day (QID) | ORAL | Status: DC

## 2012-03-17 MED ORDER — ENALAPRIL MALEATE 20 MG PO TABS: 20.0000 mg | ORAL_TABLET | Freq: Every day | ORAL | Status: DC

## 2012-03-17 MED ORDER — ATORVASTATIN CALCIUM 10 MG PO TABS: 10.0000 mg | ORAL_TABLET | Freq: Every day | ORAL | Status: DC

## 2012-03-17 MED ORDER — TRAZODONE HCL 50 MG PO TABS: 50.0000 mg | ORAL_TABLET | Freq: Every day | ORAL | Status: DC

## 2012-03-17 MED ORDER — PIOGLITAZONE HCL 30 MG PO TABS: 30.0000 mg | ORAL_TABLET | Freq: Every day | ORAL | Status: DC

## 2012-03-17 MED ORDER — ERGOCALCIFEROL 1.25 MG (50000 UT) PO CAPS: 50000.0000 [IU] | ORAL_CAPSULE | ORAL | Status: DC

## 2012-03-17 MED ORDER — CYCLOBENZAPRINE HCL 5 MG PO TABS: 5.0000 mg | ORAL_TABLET | Freq: Every day | ORAL | Status: DC | PRN

## 2012-03-17 NOTE — Patient Instructions (Addendum)
Thank you for coming in today You are doing very well Please take your Spiriva daily Please come back sometime in early January to have the cyst cut out Please schedule 30 minutes for that appointment Great goal to stop smoking. You can do it Have a great Christmas and a Happy New Year  Chronic Obstructive Pulmonary Disease Chronic obstructive pulmonary disease (COPD) is a condition in which airflow from the lungs is restricted. The lungs can never return to normal, but there are measures you can take which will improve them and make you feel better. CAUSES   Smoking.  Exposure to secondhand smoke.  Breathing in irritants (pollution, cigarette smoke, strong smells, aerosol sprays, paint fumes).  History of lung infections. TREATMENT  Treatment focuses on making you comfortable (supportive care). Your caregiver may prescribe medications (inhaled or pills) to help improve your breathing. HOME CARE INSTRUCTIONS   If you smoke, stop smoking.  Avoid exposure to smoke, chemicals, and fumes that aggravate your breathing.  Take antibiotic medicines as directed by your caregiver.  Avoid medicines that dry up your system and slow down the elimination of secretions (antihistamines and cough syrups). This decreases respiratory capacity and may lead to infections.  Drink enough water and fluids to keep your urine clear or pale yellow. This loosens secretions.  Use humidifiers at home and at your bedside if they do not make breathing difficult.  Receive all protective vaccines your caregiver suggests, especially pneumococcal and influenza.  Use home oxygen as suggested.  Stay active. Exercise and physical activity will help maintain your ability to do things you want to do.  Eat a healthy diet. SEEK MEDICAL CARE IF:   You develop pus-like mucus (sputum).  Breathing is more labored or exercise becomes difficult to do.  You are running out of the medicine you take for your  breathing. SEEK IMMEDIATE MEDICAL CARE IF:   You have a rapid heart rate.  You have agitation, confusion, tremors, or are in a stupor (family members may need to observe this).  It becomes difficult to breathe.  You develop chest pain.  You have a fever. MAKE SURE YOU:   Understand these instructions.  Will watch your condition.  Will get help right away if you are not doing well or get worse. Document Released: 01/02/2005 Document Revised: 06/17/2011 Document Reviewed: 05/25/2010 [facility] Patient Information 2013 [place].

## 2012-03-19 ENCOUNTER — Encounter: Payer: Self-pay | Admitting: Family Medicine

## 2012-03-19 NOTE — Assessment & Plan Note (Signed)
BP appropriate today. Continue current therapy

## 2012-03-19 NOTE — Assessment & Plan Note (Signed)
Will f/u in new year

## 2012-03-19 NOTE — Assessment & Plan Note (Signed)
Last LDL 78 Continue lipitor

## 2012-03-19 NOTE — Assessment & Plan Note (Signed)
Continue CPAP as now using again

## 2012-03-19 NOTE — Assessment & Plan Note (Signed)
Pt educated on inhaler use.  Pt to start using Spiriva daily and cont w/ albuterol PRN

## 2012-03-19 NOTE — Progress Notes (Signed)
Bonnie Peterson is a 66 y.o. female who presents to [facility] today for epidermoid cyst evaluation   Cyst: posterior L scalp. Much improved w/ NSAIDs and lidocaine cream. Denies fever, increasing size or pain, purulent DC  COPD: Continues to smoke. Using Spiriva PRN. Has not used albuterol in 6-19mo No recent respiratory exacerbations. No recent ED visits for Resp issues. Denies SOB during daytime but wears CPAP at night  Tobacco: continues to smoke but plans to quit by end of month. Spent 5-10 min discussign cessation options. Pt amenable.   HLD: denies CP, palpitations, myalgias. Taking lipitor as prescribed     The following portions of the patient's history were reviewed and updated as appropriate: allergies, current medications, past medical history, family and social history, and problem list.  Patient is a smoker  Past Medical History  Diagnosis Date  . Obstructive sleep apnea   . HTN, goal below 130/80   . HLD (hyperlipidemia)   . Benign positional vertigo   . Depression   . Anxiety   . Osteoarthritis (arthritis due to wear and tear of joints)   . Irritable bowel syndrome   . Obesity, Class III, BMI 40-49.9 (morbid obesity)   . Gout   . Restrictive lung disease   . Echocardiogram findings abnormal, without diagnosis 10/10    10/10: mild pulm HTN, EF 60-65%, mild LVH, moderate aortic regurg  . Early cataracts, bilateral 10/13    Optho, Dr SGershon Crane   ROS as above otherwise neg.    Medications reviewed. Current Outpatient Prescriptions  Medication Sig Dispense Refill  . albuterol (PROVENTIL,VENTOLIN) 90 MCG/ACT inhaler Inhale 2 puffs into the lungs every 6 (six) hours as needed for wheezing or shortness of breath.  17 g  12  . allopurinol (ZYLOPRIM) 300 MG tablet Take 1 tablet (300 mg total) by mouth daily.  30 tablet  6  . amLODipine (NORVASC) 10 MG tablet Take 1 tablet (10 mg total) by mouth daily.  30 tablet  6  . aspirin 81 MG chewable tablet Chew 81 mg by mouth daily.         .Marland Kitchenatenolol (TENORMIN) 50 MG tablet Take 1 tablet (50 mg total) by mouth daily.  30 tablet  6  . atorvastatin (LIPITOR) 10 MG tablet Take 1 tablet (10 mg total) by mouth daily.  30 tablet  6  . colchicine 0.6 MG tablet Take 1 tablet (0.6 mg total) by mouth daily.  30 tablet  6  . cyclobenzaprine (FLEXERIL) 5 MG tablet Take 1 tablet (5 mg total) by mouth daily as needed.  30 tablet  6  . dicyclomine (BENTYL) 20 MG tablet Take 1 tablet (20 mg total) by mouth 4 (four) times daily.  60 tablet  6  . enalapril (VASOTEC) 20 MG tablet Take 1 tablet (20 mg total) by mouth daily.  30 tablet  6  . ergocalciferol (VITAMIN D2) 50000 UNITS capsule Take 1 capsule (50,000 Units total) by mouth once a week.  12 capsule  1  . fluticasone (FLONASE) 50 MCG/ACT nasal spray Place 1 spray into the nose daily.  16 g  3  . glipiZIDE (GLUCOTROL) 10 MG tablet Take 2 tablets (20 mg total) by mouth 2 (two) times daily before a meal.  120 tablet  6  . hydrochlorothiazide (HYDRODIURIL) 25 MG tablet Take 1 tablet (25 mg total) by mouth daily.  30 tablet  6  . hydrocortisone 2.5 % cream Apply topically 2 (two) times daily.  30 g  1  . ibuprofen (ADVIL,MOTRIN) 600 MG tablet Take 1 tablet (600 mg total) by mouth every 6 (six) hours as needed for pain.  30 tablet  0  . lidocaine (XYLOCAINE) 2 % jelly Apply topically as needed.  30 mL  0  . loratadine (CLARITIN) 10 MG tablet Take 10 mg by mouth daily.        . nitroGLYCERIN (NITROSTAT) 0.4 MG SL tablet Place 0.4 mg under the tongue every 5 (five) minutes as needed.        . pioglitazone (ACTOS) 30 MG tablet Take 1 tablet (30 mg total) by mouth daily.  30 tablet  11  . pneumococcal 23 valent vaccine (PNU-IMMUNE) 25 MCG/0.5ML injection Inject 0.5 mLs into the muscle once.  2.5 mL  0  . tiotropium (SPIRIVA HANDIHALER) 18 MCG inhalation capsule Place 1 capsule (18 mcg total) into inhaler and inhale daily.  30 capsule  6  . traZODone (DESYREL) 50 MG tablet Take 1 tablet (50 mg total)  by mouth at bedtime.  30 tablet  6    Exam:  BP 135/75  Pulse 71  Temp 98.2 F (36.8 C) (Oral)  Ht 5' 2"  (1.575 m)  Wt 233 lb 9 oz (105.943 kg)  BMI 42.72 kg/m2 Gen: Well NAD HEENT: EOMI,  MMM. Small 1-1.5x1cm moveable symmetrical mass in scalp of L posterior region. Lungs: CTABL Nl WOB  No results found for this or any previous visit (from the past 72 hour(s)).

## 2012-03-19 NOTE — Assessment & Plan Note (Signed)
Inflammation resolving.  Plan for excision in January 2014 Continue NSAIDs and lidocaine PRN No steroid injection today

## 2012-04-13 ENCOUNTER — Ambulatory Visit: Payer: No Typology Code available for payment source | Admitting: Family Medicine

## 2012-05-06 ENCOUNTER — Ambulatory Visit: Payer: No Typology Code available for payment source | Admitting: Family Medicine

## 2012-05-22 ENCOUNTER — Ambulatory Visit: Payer: No Typology Code available for payment source | Admitting: Family Medicine

## 2012-05-25 ENCOUNTER — Encounter: Payer: Self-pay | Admitting: Family Medicine

## 2012-05-25 ENCOUNTER — Ambulatory Visit (INDEPENDENT_AMBULATORY_CARE_PROVIDER_SITE_OTHER): Payer: No Typology Code available for payment source | Admitting: Family Medicine

## 2012-05-25 VITALS — BP 130/67 | HR 76 | Temp 98.5°F | Ht 62.0 in | Wt 221.0 lb

## 2012-05-25 DIAGNOSIS — L720 Epidermal cyst: Secondary | ICD-10-CM

## 2012-05-25 DIAGNOSIS — Z720 Tobacco use: Secondary | ICD-10-CM

## 2012-05-25 DIAGNOSIS — L723 Sebaceous cyst: Secondary | ICD-10-CM

## 2012-05-25 DIAGNOSIS — F17200 Nicotine dependence, unspecified, uncomplicated: Secondary | ICD-10-CM

## 2012-05-25 DIAGNOSIS — I10 Essential (primary) hypertension: Secondary | ICD-10-CM

## 2012-05-25 NOTE — Patient Instructions (Addendum)
Thank you for coming into clinic today Your procedure went well We cut out all of the cyst Please apply the antibiotic ointment daily for the next 3 days Come back in 1 week to take out the stitches If your head becomes very painful after the next 1-2 days or if you develop a fever, call the office to be seen as you may have an infection Come back and see me in 4 weeks for a routine check up

## 2012-05-25 NOTE — Progress Notes (Signed)
Bonnie Peterson is a 67 y.o. female who presents to [facility] today for cyst removal  Cyst: Topical lidocaine helps w/ pain BID. Ibuprofen QHS w/ benefit. Denies fever, rash, purulent discharge from lesion.  HTN: taking BP medications as prescribed.   The following portions of the patient's history were reviewed and updated as appropriate: allergies, current medications, past medical history, family and social history, and problem list.  Patient is a smoker  Past Medical History  Diagnosis Date  . Obstructive sleep apnea   . HTN, goal below 130/80   . HLD (hyperlipidemia)   . Benign positional vertigo   . Depression   . Anxiety   . Osteoarthritis (arthritis due to wear and tear of joints)   . Irritable bowel syndrome   . Obesity, Class III, BMI 40-49.9 (morbid obesity)   . Gout   . Restrictive lung disease   . Echocardiogram findings abnormal, without diagnosis 10/10    10/10: mild pulm HTN, EF 60-65%, mild LVH, moderate aortic regurg  . Early cataracts, bilateral 10/13    Optho, Dr Gershon Crane    ROS as above otherwise neg.    Medications reviewed. Current Outpatient Prescriptions  Medication Sig Dispense Refill  . albuterol (PROVENTIL,VENTOLIN) 90 MCG/ACT inhaler Inhale 2 puffs into the lungs every 6 (six) hours as needed for wheezing or shortness of breath.  17 g  12  . allopurinol (ZYLOPRIM) 300 MG tablet Take 1 tablet (300 mg total) by mouth daily.  30 tablet  6  . amLODipine (NORVASC) 10 MG tablet Take 1 tablet (10 mg total) by mouth daily.  30 tablet  6  . aspirin 81 MG chewable tablet Chew 81 mg by mouth daily.        [name] atenolol (TENORMIN) 50 MG tablet Take 1 tablet (50 mg total) by mouth daily.  30 tablet  6  . atorvastatin (LIPITOR) 10 MG tablet Take 1 tablet (10 mg total) by mouth daily.  30 tablet  6  . colchicine 0.6 MG tablet Take 1 tablet (0.6 mg total) by mouth daily.  30 tablet  6  . cyclobenzaprine (FLEXERIL) 5 MG tablet Take 1 tablet (5 mg total) by mouth daily as  needed.  30 tablet  6  . dicyclomine (BENTYL) 20 MG tablet Take 1 tablet (20 mg total) by mouth 4 (four) times daily.  60 tablet  6  . enalapril (VASOTEC) 20 MG tablet Take 1 tablet (20 mg total) by mouth daily.  30 tablet  6  . ergocalciferol (VITAMIN D2) 50000 UNITS capsule Take 1 capsule (50,000 Units total) by mouth once a week.  12 capsule  1  . fluticasone (FLONASE) 50 MCG/ACT nasal spray Place 1 spray into the nose daily.  16 g  3  . glipiZIDE (GLUCOTROL) 10 MG tablet Take 2 tablets (20 mg total) by mouth 2 (two) times daily before a meal.  120 tablet  6  . hydrochlorothiazide (HYDRODIURIL) 25 MG tablet Take 1 tablet (25 mg total) by mouth daily.  30 tablet  6  . hydrocortisone 2.5 % cream Apply topically 2 (two) times daily.  30 g  1  . ibuprofen (ADVIL,MOTRIN) 600 MG tablet Take 1 tablet (600 mg total) by mouth every 6 (six) hours as needed for pain.  30 tablet  0  . lidocaine (XYLOCAINE) 2 % jelly Apply topically as needed.  30 mL  0  . loratadine (CLARITIN) 10 MG tablet Take 10 mg by mouth daily.        [name]  nitroGLYCERIN (NITROSTAT) 0.4 MG SL tablet Place 0.4 mg under the tongue every 5 (five) minutes as needed.        . pioglitazone (ACTOS) 30 MG tablet Take 1 tablet (30 mg total) by mouth daily.  30 tablet  11  . pneumococcal 23 valent vaccine (PNU-IMMUNE) 25 MCG/0.5ML injection Inject 0.5 mLs into the muscle once.  2.5 mL  0  . tiotropium (SPIRIVA HANDIHALER) 18 MCG inhalation capsule Place 1 capsule (18 mcg total) into inhaler and inhale daily.  30 capsule  6  . traZODone (DESYREL) 50 MG tablet Take 1 tablet (50 mg total) by mouth at bedtime.  30 tablet  6   No current facility-administered medications for this visit.    Exam: BP 130/67  Pulse 76  Temp(Src) 98.5 F (36.9 C) (Oral)  Ht 5' 2"  (1.575 m)  Wt 221 lb (100.245 kg)  BMI 40.41 kg/m2 Gen: Well NAD HEENT: EOMI,  MMM, L posterior occipital skin bump w/ underlying cystic feel.  Lungs: CTABL Nl WOB Heart: RRR no  MRG Abd: NABS, NT, ND Exts: Non edematous BL  LE, warm and well perfused.   No results found for this or any previous visit (from the past 72 hour(s)).   Spent > 30 minutes in the care/instruction for this pt. Including procedure time.

## 2012-05-26 ENCOUNTER — Telehealth: Payer: Self-pay | Admitting: Family Medicine

## 2012-05-26 NOTE — Assessment & Plan Note (Signed)
Continues to smoke not interested in quitting at this time.  No longer holding to prior commitment to quit

## 2012-05-26 NOTE — Telephone Encounter (Signed)
Called and spoke to pt about after procedure care. Pt applying ABX ointment as instructed. Feels much better per pt. Scheduled to see me in 7 days for suture removal.

## 2012-05-26 NOTE — Assessment & Plan Note (Signed)
Continue current regimen

## 2012-05-26 NOTE — Assessment & Plan Note (Signed)
Successful complete excision today.  See Procedure note Continue w/ NSAIDs for pain relief  Use bacitracin ointment for next 3 days Suture removal in 7 days Precautions reviewed

## 2012-05-26 NOTE — Progress Notes (Signed)
Sebaceous Cyst Excision Procedure Note  Pre-operative Diagnosis: epidermoid cyst  Post-operative Diagnosis: same  Locations: Posterior mid L occipital scalp  Indications: painful and inflammed at prior appts. Pt unable to sleep on affected side. Requiring Rx therapy to treat.   Anesthesia: Lidocaine 2% with epinephrine without added sodium bicarbonate  Procedure Details  History of allergy to iodine: no  Patient informed of the risks (including bleeding and infection) and benefits of the  procedure and Written informed consent obtained.  The lesion and surrounding area was given a sterile prep using betadyne and draped in the usual sterile fashion. An incision was made over the cyst, which was dissected free of the surrounding tissue and removed.  The cyst was filled with typical sebaceous material.  The wound was closed with 3-0 Prolene using simple interrupted stitches. Antibiotic ointment and a sterile dressing applied.  The specimen was not sent for pathologic examination. The patient tolerated the procedure well.  EBL: minimal  Findings: A very 0.5x0.5cm sac was removed w/ casceous material inside. Sinus tract was present extending superficially to the epidermis.   Condition: Stable  Complications: none.  Plan: 1. Instructed to keep the wound dry and covered for 24-48h and clean thereafter. 2. Warning signs of infection were reviewed.   3. Recommended that the patient use NSAID as needed for pain.  4. Return for suture removal in 7 days. 5. Pt to apply bacitracin ointment daily for 3 days  Linna Darner, MD Family Medicine PGY-2 05/26/2012, 1:07 PM

## 2012-06-01 ENCOUNTER — Ambulatory Visit: Payer: No Typology Code available for payment source | Admitting: Family Medicine

## 2012-06-02 ENCOUNTER — Encounter: Payer: Self-pay | Admitting: Family Medicine

## 2012-06-02 ENCOUNTER — Ambulatory Visit (INDEPENDENT_AMBULATORY_CARE_PROVIDER_SITE_OTHER): Payer: No Typology Code available for payment source | Admitting: Family Medicine

## 2012-06-02 VITALS — BP 151/81 | HR 85 | Temp 97.8°F | Ht 62.0 in | Wt 223.0 lb

## 2012-06-02 DIAGNOSIS — Z4802 Encounter for removal of sutures: Secondary | ICD-10-CM | POA: Insufficient documentation

## 2012-06-02 DIAGNOSIS — N6459 Other signs and symptoms in breast: Secondary | ICD-10-CM

## 2012-06-02 DIAGNOSIS — N644 Mastodynia: Secondary | ICD-10-CM

## 2012-06-02 NOTE — Progress Notes (Signed)
Bonnie Peterson is a 67 y.o. female who presents to [facility] today with complaints of suture removal and medication refill:  1.  Suture removal:  S/p sebaceous cyst excision last week.  Has been doing well, no pain or drainage from the site since then.  No fevers or chills.    2.  Medication refills:  States she needs them all refilled.  However on review of records she had all of her medications refilled in Dec 2013 with 6 total refills, so no need to send in today.   The following portions of the patient's history were reviewed and updated as appropriate: allergies, current medications, past medical history, family and social history, and problem list.  Patient is a nonsmoker.    Past Medical History  Diagnosis Date  . Obstructive sleep apnea   . HTN, goal below 130/80   . HLD (hyperlipidemia)   . Benign positional vertigo   . Depression   . Anxiety   . Osteoarthritis (arthritis due to wear and tear of joints)   . Irritable bowel syndrome   . Obesity, Class III, BMI 40-49.9 (morbid obesity)   . Gout   . Restrictive lung disease   . Echocardiogram findings abnormal, without diagnosis 10/10    10/10: mild pulm HTN, EF 60-65%, mild LVH, moderate aortic regurg  . Early cataracts, bilateral 10/13    Optho, Dr Nile Riggs   Past Surgical History  Procedure Laterality Date  . Partial hysterectomy    . Rotator cuff repair        L rotator cuff repair 11/07-[facility] - 12/7/200  . Tonsillectomy      Medications reviewed. Current Outpatient Prescriptions  Medication Sig Dispense Refill  . albuterol (PROVENTIL,VENTOLIN) 90 MCG/ACT inhaler Inhale 2 puffs into the lungs every 6 (six) hours as needed for wheezing or shortness of breath.  17 g  12  . allopurinol (ZYLOPRIM) 300 MG tablet Take 1 tablet (300 mg total) by mouth daily.  30 tablet  6  . amLODipine (NORVASC) 10 MG tablet Take 1 tablet (10 mg total) by mouth daily.  30 tablet  6  . aspirin 81 MG chewable tablet Chew 81 mg by mouth daily.         [name] atenolol (TENORMIN) 50 MG tablet Take 1 tablet (50 mg total) by mouth daily.  30 tablet  6  . atorvastatin (LIPITOR) 10 MG tablet Take 1 tablet (10 mg total) by mouth daily.  30 tablet  6  . colchicine 0.6 MG tablet Take 1 tablet (0.6 mg total) by mouth daily.  30 tablet  6  . cyclobenzaprine (FLEXERIL) 5 MG tablet Take 1 tablet (5 mg total) by mouth daily as needed.  30 tablet  6  . dicyclomine (BENTYL) 20 MG tablet Take 1 tablet (20 mg total) by mouth 4 (four) times daily.  60 tablet  6  . enalapril (VASOTEC) 20 MG tablet Take 1 tablet (20 mg total) by mouth daily.  30 tablet  6  . ergocalciferol (VITAMIN D2) 50000 UNITS capsule Take 1 capsule (50,000 Units total) by mouth once a week.  12 capsule  1  . fluticasone (FLONASE) 50 MCG/ACT nasal spray Place 1 spray into the nose daily.  16 g  3  . glipiZIDE (GLUCOTROL) 10 MG tablet Take 2 tablets (20 mg total) by mouth 2 (two) times daily before a meal.  120 tablet  6  . hydrochlorothiazide (HYDRODIURIL) 25 MG tablet Take 1 tablet (25 mg total) by mouth daily.  30 tablet  6  . hydrocortisone 2.5 % cream Apply topically 2 (two) times daily.  30 g  1  . ibuprofen (ADVIL,MOTRIN) 600 MG tablet Take 1 tablet (600 mg total) by mouth every 6 (six) hours as needed for pain.  30 tablet  0  . lidocaine (XYLOCAINE) 2 % jelly Apply topically as needed.  30 mL  0  . loratadine (CLARITIN) 10 MG tablet Take 10 mg by mouth daily.        . nitroGLYCERIN (NITROSTAT) 0.4 MG SL tablet Place 0.4 mg under the tongue every 5 (five) minutes as needed.        . pioglitazone (ACTOS) 30 MG tablet Take 1 tablet (30 mg total) by mouth daily.  30 tablet  11  . pneumococcal 23 valent vaccine (PNU-IMMUNE) 25 MCG/0.5ML injection Inject 0.5 mLs into the muscle once.  2.5 mL  0  . tiotropium (SPIRIVA HANDIHALER) 18 MCG inhalation capsule Place 1 capsule (18 mcg total) into inhaler and inhale daily.  30 capsule  6  . traZODone (DESYREL) 50 MG tablet Take 1 tablet (50 mg total) by  mouth at bedtime.  30 tablet  6   No current facility-administered medications for this visit.    ROS as above otherwise neg.  No chest pain, palpitations, SOB, Fever, Chills, Abd pain, N/V/D.  Physical Exam:  BP 151/81  Pulse 85  Temp(Src) 97.8 F (36.6 C) (Oral)  Ht 5\' 2"  (1.575 m)  Wt 223 lb (101.152 kg)  BMI 40.78 kg/m2 Gen:  Alert, cooperative patient who appears stated age in no acute distress.  Vital signs reviewed. HEENT: 1 cm incision which is healing well.  No erythema, no drainage.  2 sutures in place.    Procedure:  Using hemostat, easily able to visualize and grasp the 2 sutures and cut them with suture removal scissors.  No trauma to skin.    No results found for this or any previous visit (from the past 72 hour(s)).

## 2012-06-02 NOTE — Assessment & Plan Note (Signed)
2 sutures easily secured and removed without complication.  No bleeding.  No pain. FU if she notes any redness, pain, or swelling at the site.

## 2012-08-20 ENCOUNTER — Ambulatory Visit (INDEPENDENT_AMBULATORY_CARE_PROVIDER_SITE_OTHER): Payer: No Typology Code available for payment source | Admitting: Family Medicine

## 2012-08-20 ENCOUNTER — Encounter: Payer: Self-pay | Admitting: Family Medicine

## 2012-08-20 VITALS — BP 135/81 | HR 71 | Ht 62.0 in | Wt 218.0 lb

## 2012-08-20 DIAGNOSIS — E119 Type 2 diabetes mellitus without complications: Secondary | ICD-10-CM

## 2012-09-09 NOTE — Progress Notes (Signed)
Entered in error

## 2012-09-16 ENCOUNTER — Ambulatory Visit: Payer: No Typology Code available for payment source | Admitting: Family Medicine

## 2012-09-23 ENCOUNTER — Encounter: Payer: Self-pay | Admitting: Family Medicine

## 2012-09-23 ENCOUNTER — Ambulatory Visit (INDEPENDENT_AMBULATORY_CARE_PROVIDER_SITE_OTHER): Payer: No Typology Code available for payment source | Admitting: Family Medicine

## 2012-09-23 VITALS — BP 138/64 | HR 64 | Temp 98.1°F | Ht 62.0 in | Wt 216.3 lb

## 2012-09-23 DIAGNOSIS — E785 Hyperlipidemia, unspecified: Secondary | ICD-10-CM

## 2012-09-23 DIAGNOSIS — Z720 Tobacco use: Secondary | ICD-10-CM

## 2012-09-23 DIAGNOSIS — I10 Essential (primary) hypertension: Secondary | ICD-10-CM

## 2012-09-23 DIAGNOSIS — E119 Type 2 diabetes mellitus without complications: Secondary | ICD-10-CM

## 2012-09-23 DIAGNOSIS — F17200 Nicotine dependence, unspecified, uncomplicated: Secondary | ICD-10-CM

## 2012-09-23 DIAGNOSIS — Z0000 Encounter for general adult medical examination without abnormal findings: Secondary | ICD-10-CM | POA: Insufficient documentation

## 2012-09-23 DIAGNOSIS — K589 Irritable bowel syndrome without diarrhea: Secondary | ICD-10-CM

## 2012-09-23 DIAGNOSIS — J449 Chronic obstructive pulmonary disease, unspecified: Secondary | ICD-10-CM

## 2012-09-23 LAB — POCT GLYCOSYLATED HEMOGLOBIN (HGB A1C): Hemoglobin A1C: 7.7

## 2012-09-23 MED ORDER — TIOTROPIUM BROMIDE MONOHYDRATE 18 MCG IN CAPS: 1.0000 | ORAL_CAPSULE | Freq: Every day | RESPIRATORY_TRACT | Status: DC

## 2012-09-23 MED ORDER — HYDROCORTISONE 1 % EX CREA: TOPICAL_CREAM | Freq: Two times a day (BID) | CUTANEOUS | Status: DC

## 2012-09-23 MED ORDER — ATENOLOL 50 MG PO TABS: 50.0000 mg | ORAL_TABLET | Freq: Every day | ORAL | Status: DC

## 2012-09-23 MED ORDER — ERGOCALCIFEROL 1.25 MG (50000 UT) PO CAPS: 50000.0000 [IU] | ORAL_CAPSULE | ORAL | Status: DC

## 2012-09-23 MED ORDER — HYDROCHLOROTHIAZIDE 25 MG PO TABS: 25.0000 mg | ORAL_TABLET | Freq: Every day | ORAL | Status: DC

## 2012-09-23 MED ORDER — LORATADINE 10 MG PO TABS: 10.0000 mg | ORAL_TABLET | Freq: Every day | ORAL | Status: DC | PRN

## 2012-09-23 MED ORDER — ENALAPRIL MALEATE 20 MG PO TABS: 20.0000 mg | ORAL_TABLET | Freq: Every day | ORAL | Status: DC

## 2012-09-23 MED ORDER — METFORMIN HCL 500 MG PO TABS: ORAL_TABLET | ORAL | Status: DC

## 2012-09-23 MED ORDER — ALBUTEROL SULFATE HFA 108 (90 BASE) MCG/ACT IN AERS: 2.0000 | INHALATION_SPRAY | RESPIRATORY_TRACT | Status: DC | PRN

## 2012-09-23 NOTE — Assessment & Plan Note (Signed)
Taking bentyl. Well controlled Pt to hold bentyl if GI upset (diarrhea) from metformin

## 2012-09-23 NOTE — Patient Instructions (Addendum)
Start metformin 532m every other day for 7 days, then go to 5062mdaily for 7 days, then go to 50063mn the morning and 500m68mery other evening for 7 days, then go to 500mg21mry morning and evening.  If you develop symptoms just decrease your dose and stop your bentyl Please try to exercise and watch your carbohydrate intake I've refilled your other medications Please call the breast center for your bone scan Please come in for your fasting lab work at your convenience.   Diabetes and Exercise Regular exercise is important and can help:   Control blood glucose (sugar).  Decrease blood pressure.    Control blood lipids (cholesterol, triglycerides).  Improve overall health. BENEFITS FROM EXERCISE  Improved fitness.  Improved flexibility.  Improved endurance.  Increased bone density.  Weight control.  Increased muscle strength.  Decreased body fat.  Improvement of the body's use of insulin, a hormone.  Increased insulin sensitivity.  Reduction of insulin needs.  Reduced stress and tension.  Helps you feel better. People with diabetes who add exercise to their lifestyle gain additional benefits, including:  Weight loss.  Reduced appetite.  Improvement of the body's use of blood glucose.  Decreased risk factors for heart disease:  Lowering of cholesterol and triglycerides.  Raising the level of good cholesterol (high-density lipoproteins, HDL).  Lowering blood sugar.  Decreased blood pressure. TYPE 1 DIABETES AND EXERCISE  Exercise will usually lower your blood glucose.  If blood glucose is greater than 240 mg/dl, check urine ketones. If ketones are present, do not exercise.  Location of the insulin injection sites may need to be adjusted with exercise. Avoid injecting insulin into areas of the body that will be exercised. For example, avoid injecting insulin into:  The arms when playing tennis.  The legs when jogging. For more information,  discuss this with your caregiver.  Keep a record of:  Food intake.  Type and amount of exercise.  Expected peak times of insulin action.  Blood glucose levels. Do this before, during, and after exercise. Review your records with your caregiver. This will help you to develop guidelines for adjusting food intake and insulin amounts.  TYPE 2 DIABETES AND EXERCISE  Regular physical activity can help control blood glucose.  Exercise is important because it may:  Increase the body's sensitivity to insulin.  Improve blood glucose control.  Exercise reduces the risk of heart disease. It decreases serum cholesterol and triglycerides. It also lowers blood pressure.  Those who take insulin or oral hypoglycemic agents should watch for signs of hypoglycemia. These signs include dizziness, shaking, sweating, chills, and confusion.  Body water is lost during exercise. It must be replaced. This will help to avoid loss of body fluids (dehydration) or heat stroke. Be sure to talk to your caregiver before starting an exercise program to make sure it is safe for you. Remember, any activity is better than none.  Document Released: 06/15/2003 Document Revised: 06/17/2011 Document Reviewed: 09/29/2008 [facility] Information 2014 ExitCSan Jose. [place] Densitometry Bone densitometry is a special X-ray that measures your bone density and can be used to help predict your risk of bone fractures. This test is used to determine bone mineral content and density to diagnose osteoporosis. Osteoporosis is the loss of bone that may cause the bone to become weak. Osteoporosis commonly occurs in women entering menopause. However, it may be found in men and in people with other diseases. PREPARATION FOR TEST No preparation necessary. WHO SHOULD BE TESTED?  All women older than 62.  Postmenopausal women (50 to 61) with risk factors for osteoporosis.  People with a previous fracture caused by normal  activities.  People with a small body frame (less than 127 poundsor a body mass index [BMI] of less than 21).  People who have a parent with a hip fracture or history of osteoporosis.  People who smoke.  People who have rheumatoid arthritis.  Anyone who engages in excessive alcohol use (more than 3 drinks most days).  Women who experience early menopause. WHEN SHOULD YOU BE RETESTED? Current guidelines suggest that you should wait at least 2 years before doing a bone density test again if your first test was normal.Recent studies indicated that women with normal bone density may be able to wait a few years before needing to repeat a bone density test. You should discuss this with your caregiver.  NORMAL FINDINGS   Normal: less than standard deviation below normal (greater than -1).  Osteopenia: 1 to 2.5 standard deviations below normal (-1 to -2.5).  Osteoporosis: greater than 2.5 standard deviations below normal (less than -2.5). Test results are reported as a "T score" and a "Z score."The T score is a number that compares your bone density with the bone density of healthy, young women.The Z score is a number that compares your bone density with the scores of women who are the same age, gender, and race.  Ranges for normal findings may vary among different laboratories and hospitals. You should always check with your doctor after having lab work or other tests done to discuss the meaning of your test results and whether your values are considered within normal limits. MEANING OF TEST  Your caregiver will go over the test results with you and discuss the importance and meaning of your results, as well as treatment options and the need for additional tests if necessary. OBTAINING THE TEST RESULTS It is your responsibility to obtain your test results. Ask the lab or department performing the test when and how you will get your results. Document Released: 04/16/2004 Document Revised:  06/17/2011 Document Reviewed: 05/09/2010 [facility] Patient Information 2014 [place].

## 2012-09-23 NOTE — Assessment & Plan Note (Signed)
Recheck fasting lipids Cont Lipitor 10 for now

## 2012-09-23 NOTE — Assessment & Plan Note (Signed)
Counseled on quitting 1/2ppd Pt to try E-cig Pt motivated

## 2012-09-23 NOTE — Assessment & Plan Note (Signed)
Cont current regimen

## 2012-09-23 NOTE — Assessment & Plan Note (Addendum)
H/o intolerance to metformin A1c trending up slightly to 7.7 Pt no longer wanting to try Actos given side effects Pt amenable to trying metformin again Pt still resistant to injectables

## 2012-09-23 NOTE — Assessment & Plan Note (Addendum)
Bone density scan ordered On Vit D2

## 2012-09-23 NOTE — Assessment & Plan Note (Signed)
Well controlled Pt to quit smoking Refill spiriva and albuterol

## 2012-09-23 NOTE — Progress Notes (Signed)
Bonnie Peterson is a 67 y.o. female who presents to [facility] today for routine f/u in DM and HTn  DM: out of actose for 6 mo. Only taking glipizide 52m twice a day. Does not tolerate Metformin. Home glucoses in the am before meals have been running around 200.   HLD: Taking Lipitor 170mdaily. Denies CP, myalgia, palpitation.   Gout: no attacks in years. Allopurinol daily. Colchicine PRN  Mammograms. Yearly   HTN: Taking all medications as prescribed  The following portions of the patient's history were reviewed and updated as appropriate: allergies, current medications, past medical history, family and social history, and problem list.  Patient is a 1/2ppd smoker  Past Medical History  Diagnosis Date  . Obstructive sleep apnea   . HTN, goal below 130/80   . HLD (hyperlipidemia)   . Benign positional vertigo   . Depression   . Anxiety   . Osteoarthritis (arthritis due to wear and tear of joints)   . Irritable bowel syndrome   . Obesity, Class III, BMI 40-49.9 (morbid obesity)   . Gout   . Restrictive lung disease   . Echocardiogram findings abnormal, without diagnosis 10/10    10/10: mild pulm HTN, EF 60-65%, mild LVH, moderate aortic regurg  . Early cataracts, bilateral 10/13    Optho, Dr ShGershon Crane  ROS as above otherwise neg.    Medications reviewed. Current Outpatient Prescriptions  Medication Sig Dispense Refill  . allopurinol (ZYLOPRIM) 300 MG tablet Take 1 tablet (300 mg total) by mouth daily.  30 tablet  6  . amLODipine (NORVASC) 10 MG tablet Take 1 tablet (10 mg total) by mouth daily.  30 tablet  6  . aspirin 81 MG chewable tablet Chew 81 mg by mouth daily.        . Marland Kitchentenolol (TENORMIN) 50 MG tablet Take 1 tablet (50 mg total) by mouth daily.  90 tablet  6  . atorvastatin (LIPITOR) 10 MG tablet Take 1 tablet (10 mg total) by mouth daily.  30 tablet  6  . colchicine 0.6 MG tablet Take 1 tablet (0.6 mg total) by mouth daily.  30 tablet  6  . cyclobenzaprine (FLEXERIL) 5  MG tablet Take 1 tablet (5 mg total) by mouth daily as needed.  30 tablet  6  . dicyclomine (BENTYL) 20 MG tablet Take 1 tablet (20 mg total) by mouth 4 (four) times daily.  60 tablet  6  . enalapril (VASOTEC) 20 MG tablet Take 1 tablet (20 mg total) by mouth daily.  90 tablet  3  . ergocalciferol (VITAMIN D2) 50000 UNITS capsule Take 1 capsule (50,000 Units total) by mouth once a week.  12 capsule  1  . fluticasone (FLONASE) 50 MCG/ACT nasal spray Place 1 spray into the nose daily.  16 g  3  . glipiZIDE (GLUCOTROL) 10 MG tablet Take 2 tablets (20 mg total) by mouth 2 (two) times daily before a meal.  120 tablet  6  . hydrochlorothiazide (HYDRODIURIL) 25 MG tablet Take 1 tablet (25 mg total) by mouth daily.  90 tablet  3  . loratadine (CLARITIN) 10 MG tablet Take 1 tablet (10 mg total) by mouth daily as needed for allergies.  30 tablet  3  . tiotropium (SPIRIVA HANDIHALER) 18 MCG inhalation capsule Place 1 capsule (18 mcg total) into inhaler and inhale daily.  90 capsule  3  . traZODone (DESYREL) 50 MG tablet Take 1 tablet (50 mg total) by mouth at bedtime.  30 tablet  6  . albuterol (PROVENTIL HFA;VENTOLIN HFA) 108 (90 BASE) MCG/ACT inhaler Inhale 2 puffs into the lungs every 4 (four) hours as needed for wheezing.  1 Inhaler  3  . albuterol (PROVENTIL,VENTOLIN) 90 MCG/ACT inhaler Inhale 2 puffs into the lungs every 6 (six) hours as needed for wheezing or shortness of breath.  17 g  12  . hydrocortisone cream 1 % Apply topically 2 (two) times daily.  30 g  0  . metFORMIN (GLUCOPHAGE) 500 MG tablet 518m every other day for 1 week, then 5071mdaily for 1 week, then 50065mwice daily for 1 week.  90 tablet  3  . nitroGLYCERIN (NITROSTAT) 0.4 MG SL tablet Place 0.4 mg under the tongue every 5 (five) minutes as needed.         No current facility-administered medications for this visit.    Exam: BP 138/64  Pulse 64  Temp(Src) 98.1 F (36.7 C) (Oral)  Ht 5' 2"  (1.575 m)  Wt 216 lb 4.8 oz (98.113  kg)  BMI 39.55 kg/m2 Gen: Well NAD HEENT: EOMI,  MMM  Results for orders placed in visit on 09/23/12 (from the past 72 39ur(s))  POCT GLYCOSYLATED HEMOGLOBIN (HGB A1C)     Status: None   Collection Time    09/23/12 10:00 AM      Result Value Range   Hemoglobin A1C 7.7

## 2012-09-29 ENCOUNTER — Other Ambulatory Visit: Payer: No Typology Code available for payment source

## 2012-09-29 DIAGNOSIS — E785 Hyperlipidemia, unspecified: Secondary | ICD-10-CM

## 2012-09-29 LAB — LIPID PANEL
Cholesterol: 193 mg/dL (ref 0–200)
HDL: 31 mg/dL — ABNORMAL LOW (ref >39)
LDL Cholesterol: 99 mg/dL (ref 0–99)
Total CHOL/HDL Ratio: 6.2 Ratio
Triglycerides: 314 mg/dL — ABNORMAL HIGH (ref <150)
VLDL: 63 mg/dL — ABNORMAL HIGH (ref 0–40)

## 2012-09-29 LAB — COMPREHENSIVE METABOLIC PANEL
ALT: 16 U/L (ref 0–35)
AST: 16 U/L (ref 0–37)
Albumin: 4.1 g/dL (ref 3.5–5.2)
Alkaline Phosphatase: 124 U/L — ABNORMAL HIGH (ref 39–117)
BUN: 12 mg/dL (ref 6–23)
CO2: 28 mEq/L (ref 19–32)
Calcium: 10 mg/dL (ref 8.4–10.5)
Chloride: 99 mEq/L (ref 96–112)
Creat: 0.8 mg/dL (ref 0.50–1.10)
Glucose, Bld: 113 mg/dL — ABNORMAL HIGH (ref 70–99)
Potassium: 4.5 mEq/L (ref 3.5–5.3)
Sodium: 138 mEq/L (ref 135–145)
Total Bilirubin: 0.4 mg/dL (ref 0.3–1.2)
Total Protein: 7.4 g/dL (ref 6.0–8.3)

## 2012-09-29 NOTE — Progress Notes (Signed)
CMP AND FLP DONE TODAY Bonnie Peterson 

## 2012-09-30 ENCOUNTER — Telehealth: Payer: Self-pay | Admitting: *Deleted

## 2012-09-30 ENCOUNTER — Other Ambulatory Visit: Payer: Self-pay | Admitting: Family Medicine

## 2012-09-30 DIAGNOSIS — E785 Hyperlipidemia, unspecified: Secondary | ICD-10-CM

## 2012-09-30 MED ORDER — ATORVASTATIN CALCIUM 20 MG PO TABS: 20.0000 mg | ORAL_TABLET | Freq: Every day | ORAL | Status: DC

## 2012-09-30 NOTE — Assessment & Plan Note (Signed)
Lipid panel noted. Increasing lipitor to 75m Likely to increase to 443mif tolerating

## 2012-09-30 NOTE — Telephone Encounter (Signed)
Pt notified of abnormal lipid panel and the need to increase Lipitor to 20 mg once a day.  Pt notified Rx at pharmacy ready for pick up.  Pt verbalized understanding.  Francisco Ostrovsky, Darlyne Russian, CMA

## 2012-10-09 ENCOUNTER — Emergency Department (HOSPITAL_COMMUNITY)
Admission: EM | Admit: 2012-10-09 | Discharge: 2012-10-09 | Disposition: A | Discharge status: Home / Self Care | Payer: No Typology Code available for payment source | Attending: Emergency Medicine | Admitting: Emergency Medicine

## 2012-10-09 ENCOUNTER — Encounter (HOSPITAL_COMMUNITY): Payer: Self-pay | Admitting: Emergency Medicine

## 2012-10-09 DIAGNOSIS — F17200 Nicotine dependence, unspecified, uncomplicated: Secondary | ICD-10-CM | POA: Insufficient documentation

## 2012-10-09 DIAGNOSIS — Z7982 Long term (current) use of aspirin: Secondary | ICD-10-CM | POA: Insufficient documentation

## 2012-10-09 DIAGNOSIS — Z8669 Personal history of other diseases of the nervous system and sense organs: Secondary | ICD-10-CM | POA: Insufficient documentation

## 2012-10-09 DIAGNOSIS — J449 Chronic obstructive pulmonary disease, unspecified: Secondary | ICD-10-CM

## 2012-10-09 DIAGNOSIS — F329 Major depressive disorder, single episode, unspecified: Secondary | ICD-10-CM | POA: Insufficient documentation

## 2012-10-09 DIAGNOSIS — J4489 Other specified chronic obstructive pulmonary disease: Secondary | ICD-10-CM | POA: Insufficient documentation

## 2012-10-09 DIAGNOSIS — Z79899 Other long term (current) drug therapy: Secondary | ICD-10-CM | POA: Insufficient documentation

## 2012-10-09 DIAGNOSIS — R51 Headache: Secondary | ICD-10-CM | POA: Insufficient documentation

## 2012-10-09 DIAGNOSIS — Y939 Activity, unspecified: Secondary | ICD-10-CM | POA: Insufficient documentation

## 2012-10-09 DIAGNOSIS — E785 Hyperlipidemia, unspecified: Secondary | ICD-10-CM | POA: Insufficient documentation

## 2012-10-09 DIAGNOSIS — M109 Gout, unspecified: Secondary | ICD-10-CM | POA: Insufficient documentation

## 2012-10-09 DIAGNOSIS — F411 Generalized anxiety disorder: Secondary | ICD-10-CM | POA: Insufficient documentation

## 2012-10-09 DIAGNOSIS — F3289 Other specified depressive episodes: Secondary | ICD-10-CM | POA: Insufficient documentation

## 2012-10-09 DIAGNOSIS — Z720 Tobacco use: Secondary | ICD-10-CM

## 2012-10-09 DIAGNOSIS — Z8719 Personal history of other diseases of the digestive system: Secondary | ICD-10-CM | POA: Insufficient documentation

## 2012-10-09 DIAGNOSIS — Z8709 Personal history of other diseases of the respiratory system: Secondary | ICD-10-CM | POA: Insufficient documentation

## 2012-10-09 DIAGNOSIS — I10 Essential (primary) hypertension: Secondary | ICD-10-CM | POA: Insufficient documentation

## 2012-10-09 DIAGNOSIS — Y929 Unspecified place or not applicable: Secondary | ICD-10-CM | POA: Insufficient documentation

## 2012-10-09 LAB — CARBOXYHEMOGLOBIN
Carboxyhemoglobin: 6.3 % (ref 0.5–1.5)
Methemoglobin: 1 % (ref 0.0–1.5)
O2 Saturation: 39.7 %
Total hemoglobin: 13.8 g/dL (ref 12.0–16.0)

## 2012-10-09 NOTE — Progress Notes (Signed)
RT Note: [facility] 6.3, Dr Oletta Lamas notified of results.

## 2012-10-09 NOTE — ED Provider Notes (Signed)
History    CSN: [id number] Arrival date & time 10/09/12  4098  First MD Initiated Contact with Patient 10/09/12 [phone]     Chief Complaint  Patient presents with  . Toxic Inhalation   (Consider location/radiation/quality/duration/timing/severity/associated sxs/prior Treatment) HPI  Patient is a 67 year old female past medical history significant for COPD, OSA, HTN, each of the cough obesity, cigarette smoker presented to the emergency department after calling 911 Gas in her apartment earlier today. Patient states her only physical complaint is a generalized aching headache that began to have to go has no complaints that started since calling 911 earlier today. Patient states she has a gas stove last use being Wednesday. Patient states she really hasn't been at home she slipped in her apartment last evening. Patient states she lives at the public health in her own apartment. Patient denies any loss of consciousness, fall, confusion, fever, chills chest pain, worsening shortness of breath from baseline, worsening headache, visual disturbance, nausea, vomiting.  Past Medical History  Diagnosis Date  . Obstructive sleep apnea   . HTN, goal below 130/80   . HLD (hyperlipidemia)   . Benign positional vertigo   . Depression   . Anxiety   . Osteoarthritis (arthritis due to wear and tear of joints)   . Irritable bowel syndrome   . Obesity, Class III, BMI 40-49.9 (morbid obesity)   . Gout   . Restrictive lung disease   . Echocardiogram findings abnormal, without diagnosis 10/10    10/10: mild pulm HTN, EF 60-65%, mild LVH, moderate aortic regurg  . Early cataracts, bilateral 10/13    Optho, Dr Nile Riggs   Past Surgical History  Procedure Laterality Date  . Partial hysterectomy    . Rotator cuff repair        L rotator cuff repair 11/07-[facility] - 12/7/200  . Tonsillectomy     Family History  Problem Relation Age of Onset  . Breast cancer Mother   . Hypertension Mother   . Coronary  artery disease Mother   . Diabetes type II Sister   . Pancreatic cancer Sister   . Diabetes type II Mother    History  Substance Use Topics  . Smoking status: Current Every Day Smoker -- 0.50 packs/day for 40 years    Types: Cigarettes  . Smokeless tobacco: Not on file     Comment: Pt using 21 mg nicotine patch.   . Alcohol Use: No   OB History   Grav Para Term Preterm Abortions TAB SAB Ect Mult Living                 Review of Systems  Constitutional: Negative for fever and chills.  HENT: Negative for neck pain and neck stiffness.   Respiratory: Negative for cough and shortness of breath.   Cardiovascular: Negative for chest pain.  Gastrointestinal: Negative for nausea and vomiting.  Neurological: Positive for headaches. Negative for dizziness and light-headedness.  All other systems reviewed and are negative.    Allergies  Metformin  Home Medications   Current Outpatient Rx  Name  Route  Sig  Dispense  Refill  . albuterol (PROVENTIL HFA;VENTOLIN HFA) 108 (90 BASE) MCG/ACT inhaler   Inhalation   Inhale 2 puffs into the lungs every 4 (four) hours as needed for wheezing.   1 Inhaler   3   . allopurinol (ZYLOPRIM) 300 MG tablet   Oral   Take 1 tablet (300 mg total) by mouth daily.   30 tablet   6   .  amLODipine (NORVASC) 10 MG tablet   Oral   Take 1 tablet (10 mg total) by mouth daily.   30 tablet   6   . aspirin 81 MG chewable tablet   Oral   Chew 81 mg by mouth daily.           [name] atenolol (TENORMIN) 50 MG tablet   Oral   Take 25 mg by mouth daily.         [name] atorvastatin (LIPITOR) 20 MG tablet   Oral   Take 1 tablet (20 mg total) by mouth daily.   30 tablet   6   . colchicine 0.6 MG tablet   Oral   Take 1 tablet (0.6 mg total) by mouth daily.   30 tablet   6   . cyclobenzaprine (FLEXERIL) 5 MG tablet   Oral   Take 1 tablet (5 mg total) by mouth daily as needed.   30 tablet   6   . dicyclomine (BENTYL) 20 MG tablet   Oral   Take 20  mg by mouth 2 (two) times daily.         . enalapril (VASOTEC) 20 MG tablet   Oral   Take 1 tablet (20 mg total) by mouth daily.   90 tablet   3   . ergocalciferol (VITAMIN D2) 50000 UNITS capsule   Oral   Take 1 capsule (50,000 Units total) by mouth once a week.   12 capsule   1   . fish oil-omega-3 fatty acids 1000 MG capsule   Oral   Take 2 g by mouth daily.         . fluticasone (FLONASE) 50 MCG/ACT nasal spray   Nasal   Place 1 spray into the nose daily.   16 g   3   . glipiZIDE (GLUCOTROL) 10 MG tablet   Oral   Take 2 tablets (20 mg total) by mouth 2 (two) times daily before a meal.   120 tablet   6   . hydrochlorothiazide (HYDRODIURIL) 25 MG tablet   Oral   Take 1 tablet (25 mg total) by mouth daily.   90 tablet   3   . hydrocortisone cream 1 %   Topical   Apply 1 application topically as needed (breast tenderness).         . Hyprom-Naphaz-Polysorb-Zn Sulf (CLEAR EYES COMPLETE OP)   Both Eyes   Place 1 drop into both eyes daily.         [name] ibuprofen (ADVIL,MOTRIN) 200 MG tablet   Oral   Take 600 mg by mouth every 6 (six) hours as needed for pain.         [name] loratadine (CLARITIN) 10 MG tablet   Oral   Take 1 tablet (10 mg total) by mouth daily as needed for allergies.   30 tablet   3   . nitroGLYCERIN (NITROSTAT) 0.4 MG SL tablet   Sublingual   Place 0.4 mg under the tongue every 5 (five) minutes as needed for chest pain.          [name] tiotropium (SPIRIVA) 18 MCG inhalation capsule   Inhalation   Place 18 mcg into inhaler and inhale as needed (if its hot outside).         . traZODone (DESYREL) 50 MG tablet   Oral   Take 50 mg by mouth at bedtime as needed for sleep.         [name] VITAMIN E PO  Oral   Take 1 tablet by mouth once a week. Take with the vitamin D         . metFORMIN (GLUCOPHAGE) 500 MG tablet      500mg  every other day for 1 week, then 500mg  daily for 1 week, then 500mg  twice daily for 1 week.   90 tablet   3     BP 168/73  Pulse 87  Temp(Src) 98.5 F (36.9 C) (Oral)  Resp 18  SpO2 100% Physical Exam  Constitutional: She is oriented to person, place, and time. She appears well-developed and well-nourished. No distress.  HENT:  Head: Normocephalic and atraumatic.  Mouth/Throat: Oropharynx is clear and moist.  Eyes: Conjunctivae are normal.  Neck: Neck supple.  Cardiovascular: Normal rate, regular rhythm, normal heart sounds and intact distal pulses.   Pulmonary/Chest: Breath sounds normal. No respiratory distress. She has no wheezes.  Abdominal: Soft. Bowel sounds are normal. There is no tenderness.  Musculoskeletal: She exhibits no edema.  Neurological: She is alert and oriented to person, place, and time.  Skin: Skin is warm, dry and intact. She is not diaphoretic. No cyanosis.  Psychiatric: She has a normal mood and affect.    ED Course  Procedures (including critical care time) Labs Reviewed  CARBOXYHEMOGLOBIN - Abnormal; Notable for the following:    Carboxyhemoglobin 6.3 (*)    All other components within normal limits   No results found. 1. Tobacco use   2. COPD (chronic obstructive pulmonary disease)     MDM  Patient looks well on exam, she is alert and oriented x4 with no new onset physical complaints. Physical exam is unremarkable she does not appear to be in any respiratory distress nor does she have any no focal deficits or signs of confusion or altered mental status on examination. Carbon monoxide lab was drawn at that level due to chronic tobacco use for the last 40 years. Vital signs are stable. Patient advised to consider smoking cessation. Advised to follow up with her primary care doctor. Return precautions discussed. Patient care was discussed with Dr. Arlana Hove who agrees with my plan. The patient is stable at time of discharge.  Jeannetta Ellis, PA-C 10/09/12 1518

## 2012-10-09 NOTE — ED Notes (Signed)
Pt arrives to ed via ptar.  Pt sts "neighbors smelled gas" and called 911.  Pt sts last time cooked was wed night. Pt in + out of house Thursday.  Pt co diffuse HA 5/10.  Pt denies nvd, loc.  Caox4, pmsx4.  nad.

## 2012-10-09 NOTE — ED Provider Notes (Signed)
Medical screening examination/treatment/procedure(s) were performed by non-physician practitioner and as supervising physician I was immediately available for consultation/collaboration.   Gavin Pound. Kaity Pitstick, MD 10/09/12 2231

## 2012-10-09 NOTE — ED Notes (Signed)
Called lab to expedite draw.  Bonnie Peterson stated he will draw soon.

## 2012-10-14 ENCOUNTER — Ambulatory Visit
Admission: RE | Admit: 2012-10-14 | Discharge: 2012-10-14 | Disposition: A | Discharge status: Home / Self Care | Payer: No Typology Code available for payment source | Source: Ambulatory Visit | Attending: Family Medicine | Admitting: Family Medicine

## 2012-10-14 DIAGNOSIS — Z0000 Encounter for general adult medical examination without abnormal findings: Secondary | ICD-10-CM

## 2012-10-15 ENCOUNTER — Other Ambulatory Visit: Payer: Self-pay

## 2012-10-15 ENCOUNTER — Telehealth: Payer: Self-pay | Admitting: Family Medicine

## 2012-10-15 DIAGNOSIS — M810 Age-related osteoporosis without current pathological fracture: Secondary | ICD-10-CM

## 2012-10-15 NOTE — Telephone Encounter (Signed)
Called to inform pt of Dexa scan results but there was no answer.  Left VM for pt to call Pt already on VitD2 but would recommend adding calcium.  Linna Darner, MD Family Medicine PGY-3 10/15/2012, 1:30 PM

## 2012-10-15 NOTE — Telephone Encounter (Signed)
-----   Message from Waldemar Dickens, MD sent at 10/15/2012 1:35 PM ----- Please try calling pt later in the day or if she calls you please refer to Dexa scan results and let her know that her bone density is decreased as expected given her age. To help this she chould contniue to take her Vitamin D and start a calcium supplement of approximately 1222m daily, and stop smoking

## 2012-10-15 NOTE — Assessment & Plan Note (Signed)
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

## 2012-10-19 NOTE — Telephone Encounter (Signed)
Attempted to call pt - no answer Wyatt Haste, RN-BSN

## 2012-10-20 ENCOUNTER — Telehealth: Payer: Self-pay | Admitting: Family Medicine

## 2012-10-20 NOTE — Telephone Encounter (Signed)
Spoke to pt by phone Still w/o calcium Coming to clinic appt on Friday Will likely start Bisphosphonate given 28yrhip fracture risk >3% and 20% for major osteoporotic fracture  DLinna Darner MD Family Medicine PGY-3 10/20/2012, 8:41 AM

## 2012-10-23 ENCOUNTER — Encounter: Payer: Self-pay | Admitting: Family Medicine

## 2012-10-23 ENCOUNTER — Encounter (INDEPENDENT_AMBULATORY_CARE_PROVIDER_SITE_OTHER): Payer: No Typology Code available for payment source | Admitting: Family Medicine

## 2012-11-12 ENCOUNTER — Encounter: Payer: Self-pay | Admitting: Family Medicine

## 2012-11-12 ENCOUNTER — Ambulatory Visit (INDEPENDENT_AMBULATORY_CARE_PROVIDER_SITE_OTHER): Payer: No Typology Code available for payment source | Admitting: Family Medicine

## 2012-11-12 VITALS — BP 155/89 | HR 70 | Temp 97.9°F | Wt 209.0 lb

## 2012-11-12 DIAGNOSIS — M25511 Pain in right shoulder: Secondary | ICD-10-CM

## 2012-11-12 DIAGNOSIS — L723 Sebaceous cyst: Secondary | ICD-10-CM

## 2012-11-12 DIAGNOSIS — I10 Essential (primary) hypertension: Secondary | ICD-10-CM

## 2012-11-12 DIAGNOSIS — E119 Type 2 diabetes mellitus without complications: Secondary | ICD-10-CM

## 2012-11-12 DIAGNOSIS — L720 Epidermal cyst: Secondary | ICD-10-CM

## 2012-11-12 DIAGNOSIS — M25519 Pain in unspecified shoulder: Secondary | ICD-10-CM

## 2012-11-12 DIAGNOSIS — M810 Age-related osteoporosis without current pathological fracture: Secondary | ICD-10-CM

## 2012-11-12 MED ORDER — ALENDRONATE SODIUM 10 MG PO TABS: 10.0000 mg | ORAL_TABLET | Freq: Every day | ORAL | Status: DC

## 2012-11-12 MED ORDER — METHYLPREDNISOLONE ACETATE 40 MG/ML IJ SUSP
40.0000 mg | Freq: Once | INTRAMUSCULAR | Status: AC
  Administered 2012-11-12: 40 mg via INTRA_ARTICULAR

## 2012-11-12 NOTE — Assessment & Plan Note (Signed)
Elevation today likely from acute pain Cont to monitor

## 2012-11-12 NOTE — Assessment & Plan Note (Signed)
H/o rotator cuff injury and surgery bilat w/ resolution Likely rotator cuff nerve impingement vs bursitis.  Steroid injection today w/ improvement Exercises given Motrin 400-600 Q6 for 2 wks Return in 4 wks May need referral to [facility] vs ortho given previous hx

## 2012-11-12 NOTE — Assessment & Plan Note (Signed)
Home CBG much improved w/ addition of metformin. Tolerating. Cont. Wt down 4lbs.

## 2012-11-12 NOTE — Assessment & Plan Note (Addendum)
Starting fosamax 74m Qday (renal function nml). Risks benefits explained Cont Ca and Vit D Reviewed repor tand given to pt

## 2012-11-12 NOTE — Patient Instructions (Addendum)
You are doing very well. The shoulder injection should help with your pain.  Please start doing your exercises Please start the fosamax for your bone strength. Please come back in 4 weeks for your shoulder pain if not better Please start taking ibuprofen 400-650m every 6 hours for the next 2 weeks straight.

## 2012-11-12 NOTE — Assessment & Plan Note (Signed)
Surgical site c/d/i. No recurrence

## 2012-11-12 NOTE — Progress Notes (Signed)
Bonnie Peterson is a 67 y.o. female who presents to [facility] today for SD appt for r shoulder pain  DM: metformin initially w/ GI symptoms now resolved. Also taking glipizide. Wt down 4lbs over last 3 wks. Home GBS now below 200 every time since starting metformin.   Osteoporosis: reviewed results of scan. Taking Calcium and VIt D.   [place] pain: R shoulder primaeily. Radiates down tricept. Started in March but resolved. Came back on after starting metformin in June. Worse w/ movement. Improves w/ flexeril. Denies swelling. Decreased strength. Denies szr, neck pain, HA. H./o rotator cuff tear and bone spur after artrhoscopic surgery in 2007.   The following portions of the patient's history were reviewed and updated as appropriate: allergies, current medications, past medical history, family and social history, and problem list.  Patient is a smoker.  Past Medical History  Diagnosis Date  . Obstructive sleep apnea   . HTN, goal below 130/80   . HLD (hyperlipidemia)   . Benign positional vertigo   . Depression   . Anxiety   . Osteoarthritis (arthritis due to wear and tear of joints)   . Irritable bowel syndrome   . Obesity, Class III, BMI 40-49.9 (morbid obesity)   . Gout   . Restrictive lung disease   . Echocardiogram findings abnormal, without diagnosis 10/10    10/10: mild pulm HTN, EF 60-65%, mild LVH, moderate aortic regurg  . Early cataracts, bilateral 10/13    Optho, Dr Gershon Crane    ROS as above otherwise neg.    Medications reviewed. Current Outpatient Prescriptions  Medication Sig Dispense Refill  . albuterol (PROVENTIL HFA;VENTOLIN HFA) 108 (90 BASE) MCG/ACT inhaler Inhale 2 puffs into the lungs every 4 (four) hours as needed for wheezing.  1 Inhaler  3  . allopurinol (ZYLOPRIM) 300 MG tablet Take 1 tablet (300 mg total) by mouth daily.  30 tablet  6  . amLODipine (NORVASC) 10 MG tablet Take 1 tablet (10 mg total) by mouth daily.  30 tablet  6  . aspirin 81 MG chewable tablet  Chew 81 mg by mouth daily.        [name] atenolol (TENORMIN) 50 MG tablet Take 25 mg by mouth daily.      [name] atorvastatin (LIPITOR) 20 MG tablet Take 1 tablet (20 mg total) by mouth daily.  30 tablet  6  . colchicine 0.6 MG tablet Take 1 tablet (0.6 mg total) by mouth daily.  30 tablet  6  . cyclobenzaprine (FLEXERIL) 5 MG tablet Take 1 tablet (5 mg total) by mouth daily as needed.  30 tablet  6  . dicyclomine (BENTYL) 20 MG tablet Take 20 mg by mouth 2 (two) times daily.      . enalapril (VASOTEC) 20 MG tablet Take 1 tablet (20 mg total) by mouth daily.  90 tablet  3  . ergocalciferol (VITAMIN D2) 50000 UNITS capsule Take 1 capsule (50,000 Units total) by mouth once a week.  12 capsule  1  . fish oil-omega-3 fatty acids 1000 MG capsule Take 2 g by mouth daily.      . fluticasone (FLONASE) 50 MCG/ACT nasal spray Place 1 spray into the nose daily.  16 g  3  . glipiZIDE (GLUCOTROL) 10 MG tablet Take 2 tablets (20 mg total) by mouth 2 (two) times daily before a meal.  120 tablet  6  . hydrochlorothiazide (HYDRODIURIL) 25 MG tablet Take 1 tablet (25 mg total) by mouth daily.  90 tablet  3  . hydrocortisone cream 1 % Apply 1 application topically as needed (breast tenderness).      . Hyprom-Naphaz-Polysorb-Zn Sulf (CLEAR EYES COMPLETE OP) Place 1 drop into both eyes daily.      [name] ibuprofen (ADVIL,MOTRIN) 200 MG tablet Take 600 mg by mouth every 6 (six) hours as needed for pain.      [name] loratadine (CLARITIN) 10 MG tablet Take 1 tablet (10 mg total) by mouth daily as needed for allergies.  30 tablet  3  . metFORMIN (GLUCOPHAGE) 500 MG tablet 518m every other day for 1 week, then 5019mdaily for 1 week, then 50082mwice daily for 1 week.  90 tablet  3  . nitroGLYCERIN (NITROSTAT) 0.4 MG SL tablet Place 0.4 mg under the tongue every 5 (five) minutes as needed for chest pain.       . tMarland Kitchenotropium (SPIRIVA) 18 MCG inhalation capsule Place 18 mcg into inhaler and inhale as needed (if its hot outside).      .  traZODone (DESYREL) 50 MG tablet Take 50 mg by mouth at bedtime as needed for sleep.      . VMarland KitchenTAMIN E PO Take 1 tablet by mouth once a week. Take with the vitamin D       No current facility-administered medications for this visit.    Exam:  BP 155/89  Pulse 70  Temp(Src) 97.9 F (36.6 C) (Oral)  Wt 209 lb (94.802 kg)  BMI 38.22 kg/m2 Gen: Well NAD HEENT: EOMI,  MMM MSK: R shoulder ROM limited secondary to pain w/ Abduction to 90 degrees and w/ external rotation. Pont tenderness around the acromyoclavicular joint and w/ hawkins. Spurlings negative. Lift off and belly rotation w/o pain. 4/5 empty can on R and 5/5 on L. Deltoid firm to touch and painful even when other muscles relaxed  No results found for this or any previous visit (from the past 72 hour(s)).  Written consent was obtained. The skin was cleaned using alcholol prep pads x3. A steroid injection was performed on Pts R shoulder using a posterior approach (assisted by SeaMargrett Rud3). 1% plain Lidocaine and 40 mg of Depo-Medrol was injected into the subacromial space. This was well tolerated.  Pain and ROM signivicantly improved s/p injectino  Deltoid trigger point injection was performed w/ 1% lidocaine w/ release of muscle spasm.

## 2012-11-30 ENCOUNTER — Other Ambulatory Visit (HOSPITAL_COMMUNITY): Payer: Self-pay

## 2012-11-30 DIAGNOSIS — Z1231 Encounter for screening mammogram for malignant neoplasm of breast: Secondary | ICD-10-CM

## 2012-12-18 ENCOUNTER — Encounter (HOSPITAL_COMMUNITY): Payer: Self-pay | Admitting: Family Medicine

## 2012-12-18 ENCOUNTER — Emergency Department (HOSPITAL_COMMUNITY)
Admission: EM | Admit: 2012-12-18 | Discharge: 2012-12-18 | Disposition: A | Discharge status: Home / Self Care | Payer: Medicaid Other | Attending: Emergency Medicine | Admitting: Emergency Medicine

## 2012-12-18 DIAGNOSIS — M109 Gout, unspecified: Secondary | ICD-10-CM | POA: Insufficient documentation

## 2012-12-18 DIAGNOSIS — Z7983 Long term (current) use of bisphosphonates: Secondary | ICD-10-CM | POA: Insufficient documentation

## 2012-12-18 DIAGNOSIS — F3289 Other specified depressive episodes: Secondary | ICD-10-CM | POA: Insufficient documentation

## 2012-12-18 DIAGNOSIS — IMO0002 Reserved for concepts with insufficient information to code with codable children: Secondary | ICD-10-CM | POA: Insufficient documentation

## 2012-12-18 DIAGNOSIS — F329 Major depressive disorder, single episode, unspecified: Secondary | ICD-10-CM | POA: Insufficient documentation

## 2012-12-18 DIAGNOSIS — Z79899 Other long term (current) drug therapy: Secondary | ICD-10-CM | POA: Insufficient documentation

## 2012-12-18 DIAGNOSIS — I10 Essential (primary) hypertension: Secondary | ICD-10-CM | POA: Insufficient documentation

## 2012-12-18 DIAGNOSIS — F17200 Nicotine dependence, unspecified, uncomplicated: Secondary | ICD-10-CM | POA: Insufficient documentation

## 2012-12-18 DIAGNOSIS — Z7982 Long term (current) use of aspirin: Secondary | ICD-10-CM | POA: Insufficient documentation

## 2012-12-18 DIAGNOSIS — M1990 Unspecified osteoarthritis, unspecified site: Secondary | ICD-10-CM | POA: Insufficient documentation

## 2012-12-18 DIAGNOSIS — R319 Hematuria, unspecified: Secondary | ICD-10-CM | POA: Insufficient documentation

## 2012-12-18 DIAGNOSIS — R5381 Other malaise: Secondary | ICD-10-CM | POA: Insufficient documentation

## 2012-12-18 DIAGNOSIS — E785 Hyperlipidemia, unspecified: Secondary | ICD-10-CM | POA: Insufficient documentation

## 2012-12-18 DIAGNOSIS — F411 Generalized anxiety disorder: Secondary | ICD-10-CM | POA: Insufficient documentation

## 2012-12-18 DIAGNOSIS — Z8709 Personal history of other diseases of the respiratory system: Secondary | ICD-10-CM | POA: Insufficient documentation

## 2012-12-18 DIAGNOSIS — G4733 Obstructive sleep apnea (adult) (pediatric): Secondary | ICD-10-CM | POA: Insufficient documentation

## 2012-12-18 DIAGNOSIS — IMO0001 Reserved for inherently not codable concepts without codable children: Secondary | ICD-10-CM | POA: Insufficient documentation

## 2012-12-18 DIAGNOSIS — R110 Nausea: Secondary | ICD-10-CM | POA: Insufficient documentation

## 2012-12-18 DIAGNOSIS — N390 Urinary tract infection, site not specified: Secondary | ICD-10-CM

## 2012-12-18 DIAGNOSIS — Z90710 Acquired absence of both cervix and uterus: Secondary | ICD-10-CM | POA: Insufficient documentation

## 2012-12-18 LAB — URINALYSIS, ROUTINE W REFLEX MICROSCOPIC
Glucose, UA: NEGATIVE mg/dL
Ketones, ur: 15 mg/dL — AB
Nitrite: POSITIVE — AB
Protein, ur: 100 mg/dL — AB
Specific Gravity, Urine: 1.018 (ref 1.005–1.030)
Urobilinogen, UA: 1 mg/dL (ref 0.0–1.0)
pH: 5.5 (ref 5.0–8.0)

## 2012-12-18 LAB — CBC
HCT: 40.5 % (ref 36.0–46.0)
Hemoglobin: 13.8 g/dL (ref 12.0–15.0)
MCH: 29.2 pg (ref 26.0–34.0)
MCHC: 34.1 g/dL (ref 30.0–36.0)
MCV: 85.6 fL (ref 78.0–100.0)
Platelets: 165 10*3/uL (ref 150–400)
RBC: 4.73 MIL/uL (ref 3.87–5.11)
RDW: 14.2 % (ref 11.5–15.5)
WBC: 17.7 10*3/uL — ABNORMAL HIGH (ref 4.0–10.5)

## 2012-12-18 LAB — URINE MICROSCOPIC-ADD ON

## 2012-12-18 LAB — BASIC METABOLIC PANEL
BUN: 17 mg/dL (ref 6–23)
CO2: 23 mEq/L (ref 19–32)
Calcium: 9.3 mg/dL (ref 8.4–10.5)
Chloride: 93 mEq/L — ABNORMAL LOW (ref 96–112)
Creatinine, Ser: 0.87 mg/dL (ref 0.50–1.10)
GFR calc Af Amer: 79 mL/min — ABNORMAL LOW (ref >90)
GFR calc non Af Amer: 68 mL/min — ABNORMAL LOW (ref >90)
Glucose, Bld: 166 mg/dL — ABNORMAL HIGH (ref 70–99)
Potassium: 3.7 mEq/L (ref 3.5–5.1)
Sodium: 132 mEq/L — ABNORMAL LOW (ref 135–145)

## 2012-12-18 MED ORDER — PHENAZOPYRIDINE HCL 200 MG PO TABS: 200.0000 mg | ORAL_TABLET | Freq: Three times a day (TID) | ORAL | Status: DC

## 2012-12-18 MED ORDER — ACETAMINOPHEN 500 MG PO TABS
1000.0000 mg | ORAL_TABLET | Freq: Once | ORAL | Status: AC
  Administered 2012-12-18: 1000 mg via ORAL
  Filled 2012-12-18: qty 2

## 2012-12-18 MED ORDER — SULFAMETHOXAZOLE-TMP DS 800-160 MG PO TABS: 1.0000 | ORAL_TABLET | Freq: Two times a day (BID) | ORAL | Status: DC

## 2012-12-18 MED ORDER — DEXTROSE 5 % IV SOLN
1.0000 g | INTRAVENOUS | Status: DC
  Administered 2012-12-18: 1 g via INTRAVENOUS
  Filled 2012-12-18: qty 10

## 2012-12-18 MED ORDER — MORPHINE SULFATE 4 MG/ML IJ SOLN
4.0000 mg | Freq: Once | INTRAMUSCULAR | Status: AC
  Administered 2012-12-18: 4 mg via INTRAVENOUS
  Filled 2012-12-18: qty 1

## 2012-12-18 MED ORDER — ONDANSETRON HCL 4 MG/2ML IJ SOLN
4.0000 mg | Freq: Once | INTRAMUSCULAR | Status: AC
  Administered 2012-12-18: 4 mg via INTRAVENOUS
  Filled 2012-12-18: qty 2

## 2012-12-18 MED ORDER — SODIUM CHLORIDE 0.9 % IV BOLUS (SEPSIS)
1000.0000 mL | Freq: Once | INTRAVENOUS | Status: AC
  Administered 2012-12-18: 1000 mL via INTRAVENOUS

## 2012-12-18 MED ORDER — SODIUM CHLORIDE 0.9 % IV SOLN: Freq: Once | INTRAVENOUS | Status: DC

## 2012-12-18 NOTE — ED Notes (Addendum)
PT O2 dropped to 87% on RA. Pt denies SOB, but placed on 2L. Increased to 98%. Primary RN made aware

## 2012-12-18 NOTE — ED Notes (Signed)
Placed patient on monitor, 5 lead, BP cuff and pulse ox.

## 2012-12-18 NOTE — ED Notes (Signed)
Per pt sts dysuria, vomiting and dehydration since Tuesday. sts back pain and believes she has a UTI.

## 2012-12-18 NOTE — Progress Notes (Signed)
Patient ID: Bonnie Peterson, female   DOB: 1946-03-20, 67 y.o.   MRN: [id number]   Enterred in error

## 2012-12-18 NOTE — ED Provider Notes (Signed)
Medical screening examination/treatment/procedure(s) were conducted as a shared visit with non-physician practitioner(s) and myself.  I personally evaluated the patient during the encounter  Pt with symptoms of dysuria for several days, now with N/V, febrile here to 102.  Normotensive, not toxic appearing.  Some CVA tenderness suggests pyelonephritis.  Abd is otherwise soft, no respiratory distress.  No diarrhea.  Pt given IVF's, antipyretics, and IV abx dose.  Pt feels comfortable being discharged and she could follow up with PCP at [facility] closely next week.    Gavin Pound. Mariadelosang Wynns, MD 12/18/12 1827

## 2012-12-18 NOTE — ED Provider Notes (Signed)
CSN: [id number]     Arrival date & time 12/18/12  1303 History   First MD Initiated Contact with Patient 12/18/12 1456     Chief Complaint  Patient presents with  . Dysuria   (Consider location/radiation/quality/duration/timing/severity/associated sxs/prior Treatment) HPI  Bonnie Peterson is a 67 year old female who presents the emergency department with chief complaint of dysuria.  Patient states that over the past week she has had several episodes of hematuria.  The patient states that at first she drank strawberry juice and thought that it came out the same color.  The patient has had associated dysuria.  Over the past 3 days she has had shaking chills, subjective fever, nausea, decreased appetite and weakness.  She has associated suprapubic pain and now right flank pain.  Upon my examination the patient also is febrile to 102.6. Vision denies any other symptoms such as abdominal pain, vomiting, changes in stool color or consistency.  She denies chest pain or shortness of breath.  Patient denies any productive cough or symptoms of respiratory infection. Past Medical History  Diagnosis Date  . Obstructive sleep apnea   . HTN, goal below 130/80   . HLD (hyperlipidemia)   . Benign positional vertigo   . Depression   . Anxiety   . Osteoarthritis (arthritis due to wear and tear of joints)   . Irritable bowel syndrome   . Obesity, Class III, BMI 40-49.9 (morbid obesity)   . Gout   . Restrictive lung disease   . Echocardiogram findings abnormal, without diagnosis 10/10    10/10: mild pulm HTN, EF 60-65%, mild LVH, moderate aortic regurg  . Early cataracts, bilateral 10/13    Optho, Dr Nile Riggs   Past Surgical History  Procedure Laterality Date  . Partial hysterectomy    . Rotator cuff repair        L rotator cuff repair 11/07-[facility] - 12/7/200  . Tonsillectomy     Family History  Problem Relation Age of Onset  . Breast cancer Mother   . Hypertension Mother   . Coronary artery  disease Mother   . Diabetes type II Sister   . Pancreatic cancer Sister   . Diabetes type II Mother    History  Substance Use Topics  . Smoking status: Current Every Day Smoker -- 0.50 packs/day for 40 years    Types: Cigarettes  . Smokeless tobacco: Not on file  . Alcohol Use: No   OB History   Grav Para Term Preterm Abortions TAB SAB Ect Mult Living                 Review of Systems  Constitutional: Negative for fever, chills, activity change, appetite change and fatigue.  Eyes: Negative for visual disturbance.  Respiratory: Negative for cough and shortness of breath.   Cardiovascular: Negative for chest pain, palpitations and leg swelling.  Gastrointestinal: Positive for nausea and abdominal pain (suprapubic). Negative for vomiting.  Genitourinary: Positive for dysuria, hematuria and flank pain.  Musculoskeletal: Positive for myalgias.  Skin: Negative for rash.  Neurological: Positive for weakness.    Allergies  Review of patient's allergies indicates no active allergies.  Home Medications   Current Outpatient Rx  Name  Route  Sig  Dispense  Refill  . albuterol (PROVENTIL HFA;VENTOLIN HFA) 108 (90 BASE) MCG/ACT inhaler   Inhalation   Inhale 2 puffs into the lungs every 4 (four) hours as needed for wheezing.   1 Inhaler   3   . alendronate (FOSAMAX) 10 MG tablet  Oral   Take 1 tablet (10 mg total) by mouth daily before breakfast. Take with a full glass of water on an empty stomach.   90 tablet   3   . allopurinol (ZYLOPRIM) 300 MG tablet   Oral   Take 1 tablet (300 mg total) by mouth daily.   30 tablet   6   . amLODipine (NORVASC) 10 MG tablet   Oral   Take 1 tablet (10 mg total) by mouth daily.   30 tablet   6   . aspirin 81 MG chewable tablet   Oral   Chew 81 mg by mouth daily.           [name] atenolol (TENORMIN) 50 MG tablet   Oral   Take 25 mg by mouth daily.         [name] atorvastatin (LIPITOR) 20 MG tablet   Oral   Take 1 tablet (20 mg total)  by mouth daily.   30 tablet   6   . cyclobenzaprine (FLEXERIL) 5 MG tablet   Oral   Take 1 tablet (5 mg total) by mouth daily as needed.   30 tablet   6   . enalapril (VASOTEC) 20 MG tablet   Oral   Take 1 tablet (20 mg total) by mouth daily.   90 tablet   3   . ergocalciferol (VITAMIN D2) 50000 UNITS capsule   Oral   Take 1 capsule (50,000 Units total) by mouth once a week.   12 capsule   1   . fish oil-omega-3 fatty acids 1000 MG capsule   Oral   Take 2 g by mouth daily.         . fluticasone (FLONASE) 50 MCG/ACT nasal spray   Nasal   Place 1 spray into the nose daily.   16 g   3   . glipiZIDE (GLUCOTROL) 10 MG tablet   Oral   Take 2 tablets (20 mg total) by mouth 2 (two) times daily before a meal.   120 tablet   6   . hydrochlorothiazide (HYDRODIURIL) 25 MG tablet   Oral   Take 1 tablet (25 mg total) by mouth daily.   90 tablet   3   . hydrocortisone cream 1 %   Topical   Apply 1 application topically as needed (breast tenderness).         . Hyprom-Naphaz-Polysorb-Zn Sulf (CLEAR EYES COMPLETE OP)   Both Eyes   Place 1 drop into both eyes daily.         [name] ibuprofen (ADVIL,MOTRIN) 200 MG tablet   Oral   Take 600 mg by mouth every 6 (six) hours as needed for pain.         [name] loratadine (CLARITIN) 10 MG tablet   Oral   Take 1 tablet (10 mg total) by mouth daily as needed for allergies.   30 tablet   3   . metFORMIN (GLUCOPHAGE) 500 MG tablet      500mg  every other day for 1 week, then 500mg  daily for 1 week, then 500mg  twice daily for 1 week.   90 tablet   3   . nitroGLYCERIN (NITROSTAT) 0.4 MG SL tablet   Sublingual   Place 0.4 mg under the tongue every 5 (five) minutes as needed for chest pain.          [name] tiotropium (SPIRIVA) 18 MCG inhalation capsule   Inhalation   Place 18 mcg into inhaler and inhale as  needed (if its hot outside).         . traZODone (DESYREL) 50 MG tablet   Oral   Take 50 mg by mouth at bedtime as needed  for sleep.         [name] VITAMIN E PO   Oral   Take 1 tablet by mouth once a week. Take with the vitamin D         . colchicine 0.6 MG tablet   Oral   Take 1 tablet (0.6 mg total) by mouth daily.   30 tablet   6    BP 157/64  Pulse 100  Temp(Src) 102.6 F (39.2 C) (Oral)  Resp 18  SpO2 99% Physical Exam  Nursing note and vitals reviewed. Constitutional: She is oriented to person, place, and time.  Ill, but nontoxic appearing female in NAD  HENT:  Head: Normocephalic and atraumatic.  Eyes: Conjunctivae are normal. No scleral icterus.  Neck: Normal range of motion.  Cardiovascular: Normal rate, regular rhythm and normal heart sounds.  Exam reveals no gallop and no friction rub.   No murmur heard. Pulmonary/Chest: Effort normal and breath sounds normal. No respiratory distress.  Abdominal: Soft. Bowel sounds are normal. She exhibits no mass. There is tenderness (suprapubic). There is no guarding.  +CVA tenderness BL  Neurological: She is alert and oriented to person, place, and time.  Skin: Skin is warm and dry.    ED Course  Procedures (including critical care time) Labs Review Labs Reviewed  URINALYSIS, ROUTINE W REFLEX MICROSCOPIC  CBC  BASIC METABOLIC PANEL   Imaging Review No results found.  MDM  No diagnosis found. Patient with urinary tract infection, positive hematuria, nitrates and too numerous to count white blood cells.  She is a leukocytosis of 17.7.  Basic metabolic panel is pending.  The patient is getting IV fluids, 1000 mg Tylenol by mouth, and Rocephin IV.  5:47 PM Patient states that she is feeling much beter after fluids and tylenol. She has not yet urinated after fluid bolus and rocephin is being hung currently. Will give more fluids.   6:48 PM Filed Vitals:   12/18/12 1313 12/18/12 1538 12/18/12 1602 12/18/12 1709  BP: 164/70  157/64 127/53  Pulse: 100   93  Temp: 99.3 F (37.4 C) 102.6 F (39.2 C)    TempSrc: Oral Oral    Resp: 22  18  20   SpO2: 96%  99% 96%   Patient feeling much better, Stable vitals.  Oral temp by my assessment is 99.5. I have discussed the case with Dr. Oletta Lamas who feels that the patient is safe for discharge with oral abx, pyridium and fluids.  The patient appears reasonably screened and/or stabilized for discharge and I doubt any other medical condition or other [facility] requiring further screening, evaluation, or treatment in the ED at this time prior to discharge.   Arthor Captain, PA-C 12/18/12 1851

## 2012-12-21 LAB — URINE CULTURE: Colony Count: >=100000

## 2013-01-04 ENCOUNTER — Encounter: Payer: Self-pay | Admitting: Family Medicine

## 2013-01-04 ENCOUNTER — Ambulatory Visit (INDEPENDENT_AMBULATORY_CARE_PROVIDER_SITE_OTHER): Payer: No Typology Code available for payment source | Admitting: Family Medicine

## 2013-01-04 VITALS — BP 145/76 | HR 80 | Temp 98.2°F | Wt 202.0 lb

## 2013-01-04 DIAGNOSIS — R3989 Other symptoms and signs involving the genitourinary system: Secondary | ICD-10-CM

## 2013-01-04 DIAGNOSIS — J449 Chronic obstructive pulmonary disease, unspecified: Secondary | ICD-10-CM

## 2013-01-04 DIAGNOSIS — R399 Unspecified symptoms and signs involving the genitourinary system: Secondary | ICD-10-CM

## 2013-01-04 LAB — POCT URINALYSIS DIPSTICK
Bilirubin, UA: NEGATIVE
Blood, UA: NEGATIVE
Glucose, UA: NEGATIVE
Ketones, UA: NEGATIVE
Leukocytes, UA: NEGATIVE
Nitrite, UA: NEGATIVE
Protein, UA: NEGATIVE
Spec Grav, UA: 1.025
Urobilinogen, UA: 0.2
pH, UA: 5.5

## 2013-01-04 MED ORDER — PREDNISONE 20 MG PO TABS: 60.0000 mg | ORAL_TABLET | Freq: Every day | ORAL | Status: DC

## 2013-01-04 MED ORDER — DOXYCYCLINE HYCLATE 100 MG PO TABS: 100.0000 mg | ORAL_TABLET | Freq: Two times a day (BID) | ORAL | Status: DC

## 2013-01-04 MED ORDER — PREDNISONE 20 MG PO TABS: 20.0000 mg | ORAL_TABLET | Freq: Every day | ORAL | Status: DC

## 2013-01-04 NOTE — Progress Notes (Signed)
Bonnie Peterson is a 67 y.o. female who presents to [facility] today for Bronchitis  Treated for UTI on 9/12 w/ antibiotics. Felt well initially after treatment w/ ABX. Gout flare immediately after stopping ABX. Productive cough and ill feeling started after that time about 2 wks ago.  Poor appetite. Subjective fevers and night sweats, and chills at night. Runny nose. Flonase x2 w/o benefit. Out of allergy medicine. Using albuterol once daily. Using CPAP at night.   Smoking about 4-5 per day.   The following portions of the patient's history were reviewed and updated as appropriate: allergies, current medications, past medical history, family and social history, and problem list.  Patient is a smoker  Past Medical History  Diagnosis Date  . Obstructive sleep apnea   . HTN, goal below 130/80   . HLD (hyperlipidemia)   . Benign positional vertigo   . Depression   . Anxiety   . Osteoarthritis (arthritis due to wear and tear of joints)   . Irritable bowel syndrome   . Obesity, Class III, BMI 40-49.9 (morbid obesity)   . Gout   . Restrictive lung disease   . Echocardiogram findings abnormal, without diagnosis 10/10    10/10: mild pulm HTN, EF 60-65%, mild LVH, moderate aortic regurg  . Early cataracts, bilateral 10/13    Optho, Dr Gershon Crane    ROS as above otherwise neg.    Medications reviewed. Current Outpatient Prescriptions  Medication Sig Dispense Refill  . albuterol (PROVENTIL HFA;VENTOLIN HFA) 108 (90 BASE) MCG/ACT inhaler Inhale 2 puffs into the lungs every 4 (four) hours as needed for wheezing.  1 Inhaler  3  . alendronate (FOSAMAX) 10 MG tablet Take 1 tablet (10 mg total) by mouth daily before breakfast. Take with a full glass of water on an empty stomach.  90 tablet  3  . allopurinol (ZYLOPRIM) 300 MG tablet Take 1 tablet (300 mg total) by mouth daily.  30 tablet  6  . amLODipine (NORVASC) 10 MG tablet Take 1 tablet (10 mg total) by mouth daily.  30 tablet  6  . aspirin 81 MG  chewable tablet Chew 81 mg by mouth daily.        [name] atenolol (TENORMIN) 50 MG tablet Take 25 mg by mouth daily.      [name] atorvastatin (LIPITOR) 20 MG tablet Take 1 tablet (20 mg total) by mouth daily.  30 tablet  6  . colchicine 0.6 MG tablet Take 1 tablet (0.6 mg total) by mouth daily.  30 tablet  6  . cyclobenzaprine (FLEXERIL) 5 MG tablet Take 1 tablet (5 mg total) by mouth daily as needed.  30 tablet  6  . enalapril (VASOTEC) 20 MG tablet Take 1 tablet (20 mg total) by mouth daily.  90 tablet  3  . ergocalciferol (VITAMIN D2) 50000 UNITS capsule Take 1 capsule (50,000 Units total) by mouth once a week.  12 capsule  1  . fish oil-omega-3 fatty acids 1000 MG capsule Take 2 g by mouth daily.      . fluticasone (FLONASE) 50 MCG/ACT nasal spray Place 1 spray into the nose daily.  16 g  3  . glipiZIDE (GLUCOTROL) 10 MG tablet Take 2 tablets (20 mg total) by mouth 2 (two) times daily before a meal.  120 tablet  6  . hydrochlorothiazide (HYDRODIURIL) 25 MG tablet Take 1 tablet (25 mg total) by mouth daily.  90 tablet  3  . hydrocortisone cream 1 % Apply 1 application topically  as needed (breast tenderness).      . Hyprom-Naphaz-Polysorb-Zn Sulf (CLEAR EYES COMPLETE OP) Place 1 drop into both eyes daily.      [name] ibuprofen (ADVIL,MOTRIN) 200 MG tablet Take 600 mg by mouth every 6 (six) hours as needed for pain.      [name] loratadine (CLARITIN) 10 MG tablet Take 1 tablet (10 mg total) by mouth daily as needed for allergies.  30 tablet  3  . metFORMIN (GLUCOPHAGE) 500 MG tablet 588m every other day for 1 week, then 5077mdaily for 1 week, then 50057mwice daily for 1 week.  90 tablet  3  . nitroGLYCERIN (NITROSTAT) 0.4 MG SL tablet Place 0.4 mg under the tongue every 5 (five) minutes as needed for chest pain.       . phenazopyridine (PYRIDIUM) 200 MG tablet Take 1 tablet (200 mg total) by mouth 3 (three) times daily.  6 tablet  0  . sulfamethoxazole-trimethoprim (BACTRIM DS) 800-160 MG per tablet Take 1 tablet  by mouth 2 (two) times daily.  28 tablet  0  . tiotropium (SPIRIVA) 18 MCG inhalation capsule Place 18 mcg into inhaler and inhale as needed (if its hot outside).      . traZODone (DESYREL) 50 MG tablet Take 50 mg by mouth at bedtime as needed for sleep.      . VMarland KitchenTAMIN E PO Take 1 tablet by mouth once a week. Take with the vitamin D       No current facility-administered medications for this visit.    Exam:  BP 145/76  Pulse 80  Temp(Src) 98.2 F (36.8 C) (Oral)  Wt 202 lb (91.627 kg)  BMI 36.94 kg/m2 Gen: Well NAD HEENT: EOMI,  MMM Lungs: Wheezing bilat Heart: RRR no MRG Abd: NABS, NT, ND Exts: Non edematous BL  LE, warm and well perfused.   Results for orders placed in visit on 01/04/13 (from the past 72 hour(s))  POCT URINALYSIS DIPSTICK     Status: None   Collection Time    01/04/13  3:30 PM      Result Value Range   Color, UA yellow     Clarity, UA clear     Glucose, UA negative     Bilirubin, UA negative     Ketones, UA negative     Spec Grav, UA 1.025     Blood, UA negative     pH, UA 5.5     Protein, UA negative     Urobilinogen, UA 0.2     Nitrite, UA negative     Leukocytes, UA Negative

## 2013-01-04 NOTE — Assessment & Plan Note (Signed)
COPD exacerbation Doxy, albuterol, prednisone

## 2013-01-04 NOTE — Patient Instructions (Addendum)
Thank you for coming in today You likely have a COPD exacerbation Please start the antibiotics and prednisone Take these until they are complete Please start using your albuterol every 4 hours for the next 2-3 days  You are also suffering from allergies Please start using your flonase every night Use an allergy medicine daily along with saline nasal spray 2-3 times daily

## 2013-01-28 ENCOUNTER — Ambulatory Visit (HOSPITAL_COMMUNITY)
Admission: RE | Admit: 2013-01-28 | Discharge: 2013-01-28 | Disposition: A | Discharge status: Home / Self Care | Payer: No Typology Code available for payment source | Source: Ambulatory Visit

## 2013-01-28 DIAGNOSIS — Z1231 Encounter for screening mammogram for malignant neoplasm of breast: Secondary | ICD-10-CM | POA: Insufficient documentation

## 2013-02-05 ENCOUNTER — Encounter: Payer: Self-pay | Admitting: Family Medicine

## 2013-02-05 ENCOUNTER — Ambulatory Visit (INDEPENDENT_AMBULATORY_CARE_PROVIDER_SITE_OTHER): Payer: No Typology Code available for payment source | Admitting: Family Medicine

## 2013-02-05 VITALS — BP 149/108 | HR 78 | Temp 98.4°F | Wt 206.0 lb

## 2013-02-05 DIAGNOSIS — R1011 Right upper quadrant pain: Secondary | ICD-10-CM

## 2013-02-05 NOTE — Patient Instructions (Signed)
You are doing well overall. Please go to [facility] imaging for the ultrasound Please follow the dietary recommendations below.  You may need surgery to fix this problem Please come back to see me if this does not improve Have a great weekend Cholecystitis Cholecystitis is an inflammation of your gallbladder. It is usually caused by a buildup of gallstones or sludge (cholelithiasis) in your gallbladder. The gallbladder stores a fluid that helps digest fats (bile). Cholecystitis is serious and needs treatment right away.  CAUSES   Gallstones. Gallstones can block the tube that leads to your gallbladder, causing bile to build up. As bile builds up, the gallbladder becomes inflamed.  Bile duct problems, such as blockage from scarring or kinking.  Tumors. Tumors can stop bile from leaving your gallbladder correctly, causing bile to build up. As bile builds up, the gallbladder becomes inflamed. SYMPTOMS   Nausea.  Vomiting.  Abdominal pain, especially in the upper right area of your abdomen.  Abdominal tenderness or bloating.  Sweating.  Chills.  Fever.  Yellowing of the skin and the whites of the eyes (jaundice). DIAGNOSIS  Your caregiver may order blood tests to look for infection or gallbladder problems. Your caregiver may also order imaging tests, such as an ultrasound or computed tomography (CT) scan. Further tests may include a hepatobiliary iminodiacetic acid (HIDA) scan. This scan allows your caregiver to see your bile move from the liver to the gallbladder and to the small intestine. TREATMENT  A hospital stay is usually necessary to lessen the inflammation of your gallbladder. You may be required to not eat or drink (fast) for a certain amount of time. You may be given medicine to treat pain or an antibiotic medicine to treat an infection. Surgery may be needed to remove your gallbladder (cholecystectomy) once the inflammation has gone down. Surgery may be needed right away if  you develop complications such as death of gallbladder tissue (gangrene) or a tear (perforation) of the gallbladder.  [place] care will depend on your treatment. In general:  If you were given antibiotics, take them as directed. Finish them even if you start to feel better.  Only take over-the-counter or prescription medicines for pain, discomfort, or fever as directed by your caregiver.  Follow a low-fat diet until you see your caregiver again.  Keep all follow-up visits as directed by your caregiver. SEEK IMMEDIATE MEDICAL CARE IF:   Your pain is increasing and not controlled by medicines.  Your pain moves to another part of your abdomen or to your back.  You have a fever.  You have nausea and vomiting. MAKE SURE YOU:  Understand these instructions.  Will watch your condition.  Will get help right away if you are not doing well or get worse. Document Released: 03/25/2005 Document Revised: 06/17/2011 Document Reviewed: 02/08/2011 [facility] Patient Information 2014 [place], [place].  Cholelithiasis Cholelithiasis (also called gallstones) is a form of gallbladder disease where gallstones form in your gallbladder. The gallbladder is a non-essential organ that stores bile made in the liver, which helps digest fats. Gallstones begin as small crystals and slowly grow into stones. Gallstone pain occurs when the gallbladder spasms, and a gallstone is blocking the duct. Pain can also occur when a stone passes out of the duct.  Women are more likely to develop gallstones than men. Other factors that increase the risk of gallbladder disease are:  Having multiple pregnancies. Physicians sometimes advise removing diseased gallbladders before future pregnancies.  Obesity.  Diets heavy  in fried foods and fat.  Increasing age (older than 100).  Prolonged use of medications containing female hormones.  Diabetes mellitus.  Rapid weight loss.  Family history of  gallstones (heredity). SYMPTOMS  Feeling sick to your stomach (nauseous).  Abdominal pain.  Yellowing of the skin (jaundice).  Sudden pain. It may persist from several minutes to several hours.  Worsening pain with deep breathing or when jarred.  Fever.  Tenderness to the touch. In some cases, when gallstones do not move into the bile duct, people have no pain or symptoms. These are called "silent" gallstones. TREATMENT In severe cases, emergency surgery may be required. HOME CARE INSTRUCTIONS   Only take over-the-counter or prescription medicines for pain, discomfort, or fever as directed by your caregiver.  Follow a low-fat diet until seen again. Fat causes the gallbladder to contract, which can result in pain.  Follow up as instructed. Attacks are almost always recurrent and surgery is usually required for permanent treatment. SEEK IMMEDIATE MEDICAL CARE IF:   Your pain increases and is not controlled by medications.  You have an oral temperature above 102 F (38.9 C), not controlled by medication.  You develop nausea and vomiting. MAKE SURE YOU:   Understand these instructions.  Will watch your condition.  Will get help right away if you are not doing well or get worse. Document Released: 03/21/2005 Document Revised: 06/17/2011 Document Reviewed: 05/24/2010 [facility] Patient Information 2014 [place], [place].

## 2013-02-05 NOTE — Progress Notes (Signed)
Bonnie Peterson is a 67 y.o. female who presents to [facility] today for stomach pain  Stomach pain: starts 1-2 hours after meals. Painful to the point of tears. Present for 3 wks. RUQ and lower breast pain, some radiation up R back to shoulder. Feels hot. Lasts for hours. Feels sick during episodes. pomegranate juice w/ some relief. BMs small and hard and daily and "sticky." no discoloration of BMs. No abdominal surgeries outside of hysterectomy (open). No reflux or dysphagia. Started on fosamax in August.   Tobacco: increased due to pain. 3/4 ppd  The following portions of the patient's history were reviewed and updated as appropriate: allergies, current medications, past medical history, family and social history, and problem list.  Patient is smoking 3/4ppd  Past Medical History  Diagnosis Date  . Obstructive sleep apnea   . HTN, goal below 130/80   . HLD (hyperlipidemia)   . Benign positional vertigo   . Depression   . Anxiety   . Osteoarthritis (arthritis due to wear and tear of joints)   . Irritable bowel syndrome   . Obesity, Class III, BMI 40-49.9 (morbid obesity)   . Gout   . Restrictive lung disease   . Echocardiogram findings abnormal, without diagnosis 10/10    10/10: mild pulm HTN, EF 60-65%, mild LVH, moderate aortic regurg  . Early cataracts, bilateral 10/13    Optho, Dr Gershon Crane  . COPD (chronic obstructive pulmonary disease)     ROS as above otherwise neg.    Medications reviewed. Current Outpatient Prescriptions  Medication Sig Dispense Refill  . albuterol (PROVENTIL HFA;VENTOLIN HFA) 108 (90 BASE) MCG/ACT inhaler Inhale 2 puffs into the lungs every 4 (four) hours as needed for wheezing.  1 Inhaler  3  . alendronate (FOSAMAX) 10 MG tablet Take 1 tablet (10 mg total) by mouth daily before breakfast. Take with a full glass of water on an empty stomach.  90 tablet  3  . allopurinol (ZYLOPRIM) 300 MG tablet Take 1 tablet (300 mg total) by mouth daily.  30 tablet  6  .  amLODipine (NORVASC) 10 MG tablet Take 1 tablet (10 mg total) by mouth daily.  30 tablet  6  . aspirin 81 MG chewable tablet Chew 81 mg by mouth daily.        [name] atenolol (TENORMIN) 50 MG tablet Take 25 mg by mouth daily.      [name] atorvastatin (LIPITOR) 20 MG tablet Take 1 tablet (20 mg total) by mouth daily.  30 tablet  6  . colchicine 0.6 MG tablet Take 1 tablet (0.6 mg total) by mouth daily.  30 tablet  6  . cyclobenzaprine (FLEXERIL) 5 MG tablet Take 1 tablet (5 mg total) by mouth daily as needed.  30 tablet  6  . enalapril (VASOTEC) 20 MG tablet Take 1 tablet (20 mg total) by mouth daily.  90 tablet  3  . ergocalciferol (VITAMIN D2) 50000 UNITS capsule Take 1 capsule (50,000 Units total) by mouth once a week.  12 capsule  1  . fish oil-omega-3 fatty acids 1000 MG capsule Take 2 g by mouth daily.      . fluticasone (FLONASE) 50 MCG/ACT nasal spray Place 1 spray into the nose daily.  16 g  3  . glipiZIDE (GLUCOTROL) 10 MG tablet Take 2 tablets (20 mg total) by mouth 2 (two) times daily before a meal.  120 tablet  6  . hydrochlorothiazide (HYDRODIURIL) 25 MG tablet Take 1 tablet (25 mg total)  by mouth daily.  90 tablet  3  . hydrocortisone cream 1 % Apply 1 application topically as needed (breast tenderness).      . Hyprom-Naphaz-Polysorb-Zn Sulf (CLEAR EYES COMPLETE OP) Place 1 drop into both eyes daily.      [name] ibuprofen (ADVIL,MOTRIN) 200 MG tablet Take 600 mg by mouth every 6 (six) hours as needed for pain.      [name] loratadine (CLARITIN) 10 MG tablet Take 1 tablet (10 mg total) by mouth daily as needed for allergies.  30 tablet  3  . metFORMIN (GLUCOPHAGE) 500 MG tablet 525m every other day for 1 week, then 5012mdaily for 1 week, then 50082mwice daily for 1 week.  90 tablet  3  . nitroGLYCERIN (NITROSTAT) 0.4 MG SL tablet Place 0.4 mg under the tongue every 5 (five) minutes as needed for chest pain.       . phenazopyridine (PYRIDIUM) 200 MG tablet Take 1 tablet (200 mg total) by mouth 3 (three)  times daily.  6 tablet  0  . tiotropium (SPIRIVA) 18 MCG inhalation capsule Place 18 mcg into inhaler and inhale as needed (if its hot outside).      . traZODone (DESYREL) 50 MG tablet Take 50 mg by mouth at bedtime as needed for sleep.      . VMarland KitchenTAMIN E PO Take 1 tablet by mouth once a week. Take with the vitamin D       No current facility-administered medications for this visit.    Exam: BP 149/108  Pulse 78  Temp(Src) 98.4 F (36.9 C) (Oral)  Wt 206 lb (93.441 kg)  BMI 37.67 kg/m2 Gen: Well NAD HEENT: EOMI,  MMM Lungs: CTABL Nl WOB Heart: RRR no MRG Abd: RUQ pain on palpation. + Murphy's sign. NABS Exts: Non edematous BL  LE, warm and well perfused.   No results found for this or any previous visit (from the past 72 hour(s)).

## 2013-02-05 NOTE — Assessment & Plan Note (Signed)
Symptoms concerning for cholelithiasis/cholecystitis. CMET, CBC, Lipase Abd [place] Symptom control w/ diet  Possible constipation component. MIralax

## 2013-02-10 ENCOUNTER — Other Ambulatory Visit: Payer: No Typology Code available for payment source

## 2013-02-10 DIAGNOSIS — R1011 Right upper quadrant pain: Secondary | ICD-10-CM

## 2013-02-10 LAB — CBC WITH DIFFERENTIAL/PLATELET
Basophils Absolute: 0 10*3/uL (ref 0.0–0.1)
Basophils Relative: 0 % (ref 0–1)
Eosinophils Absolute: 0.1 10*3/uL (ref 0.0–0.7)
Eosinophils Relative: 1 % (ref 0–5)
HCT: 38.2 % (ref 36.0–46.0)
Hemoglobin: 12.7 g/dL (ref 12.0–15.0)
Lymphocytes Relative: 34 % (ref 12–46)
Lymphs Abs: 2.8 10*3/uL (ref 0.7–4.0)
MCH: 28.6 pg (ref 26.0–34.0)
MCHC: 33.2 g/dL (ref 30.0–36.0)
MCV: 86 fL (ref 78.0–100.0)
Monocytes Absolute: 0.6 10*3/uL (ref 0.1–1.0)
Monocytes Relative: 8 % (ref 3–12)
Neutro Abs: 4.6 10*3/uL (ref 1.7–7.7)
Neutrophils Relative %: 57 % (ref 43–77)
Platelets: 226 10*3/uL (ref 150–400)
RBC: 4.44 MIL/uL (ref 3.87–5.11)
RDW: 14.4 % (ref 11.5–15.5)
WBC: 8.2 10*3/uL (ref 4.0–10.5)

## 2013-02-10 LAB — COMPREHENSIVE METABOLIC PANEL
ALT: 14 U/L (ref 0–35)
AST: 13 U/L (ref 0–37)
Albumin: 3.8 g/dL (ref 3.5–5.2)
Alkaline Phosphatase: 96 U/L (ref 39–117)
BUN: 14 mg/dL (ref 6–23)
CO2: 28 mEq/L (ref 19–32)
Calcium: 9.9 mg/dL (ref 8.4–10.5)
Chloride: 101 mEq/L (ref 96–112)
Creat: 0.64 mg/dL (ref 0.50–1.10)
Glucose, Bld: 217 mg/dL — ABNORMAL HIGH (ref 70–99)
Potassium: 4 mEq/L (ref 3.5–5.3)
Sodium: 137 mEq/L (ref 135–145)
Total Bilirubin: 0.3 mg/dL (ref 0.3–1.2)
Total Protein: 6.8 g/dL (ref 6.0–8.3)

## 2013-02-10 LAB — LIPASE: Lipase: 248 U/L — ABNORMAL HIGH (ref 0–75)

## 2013-02-10 NOTE — Progress Notes (Signed)
CBC WITH DIFF,CMP AND LIPASE DONE TODAY Bonnie Peterson

## 2013-02-11 ENCOUNTER — Ambulatory Visit
Admission: RE | Admit: 2013-02-11 | Discharge: 2013-02-11 | Disposition: A | Discharge status: Home / Self Care | Payer: No Typology Code available for payment source | Source: Ambulatory Visit | Attending: Family Medicine | Admitting: Family Medicine

## 2013-02-11 DIAGNOSIS — R1011 Right upper quadrant pain: Secondary | ICD-10-CM

## 2013-02-12 ENCOUNTER — Telehealth: Payer: Self-pay | Admitting: *Deleted

## 2013-02-12 ENCOUNTER — Encounter: Payer: Self-pay | Admitting: Family Medicine

## 2013-02-12 ENCOUNTER — Telehealth: Payer: Self-pay | Admitting: Family Medicine

## 2013-02-12 DIAGNOSIS — D376 Neoplasm of uncertain behavior of liver, gallbladder and bile ducts: Secondary | ICD-10-CM | POA: Insufficient documentation

## 2013-02-12 DIAGNOSIS — R160 Hepatomegaly, not elsewhere classified: Secondary | ICD-10-CM

## 2013-02-12 DIAGNOSIS — K859 Acute pancreatitis without necrosis or infection, unspecified: Secondary | ICD-10-CM

## 2013-02-12 NOTE — Assessment & Plan Note (Signed)
Pain controlled at home Reviewed pancreatitis diet, course of illness, and need for hospitalization as needed

## 2013-02-12 NOTE — Telephone Encounter (Signed)
Called pt to notify of results Apparent pancreatitis.  Pain no worse but continues after meals. Pt to minimize diet , adn avoid high fat/protein meals, and increase fluids If pain worsens pt to come into hospital  Of note, a mass was noted on ABD [place] that is concerning for malignancy per rads.   Pt aware adn will schedule for MRI.   Linna Darner, MD Family Medicine PGY-3 02/12/2013, 9:09 AM

## 2013-02-12 NOTE — Assessment & Plan Note (Signed)
Pt aware of mass Pt to go for MRI Will f/u results

## 2013-02-12 NOTE — Telephone Encounter (Signed)
Pt would like to know what kind of diet she should be on for her symptoms.  Please advise. Jazmin Hartsell,CMA

## 2013-02-12 NOTE — Progress Notes (Signed)
Staff to coordinate MRI w/ pt  Orders in  Linna Darner, MD Family Medicine PGY-3 02/12/2013, 9:37 AM

## 2013-02-16 NOTE — Telephone Encounter (Signed)
Pain improving.  Reviewed pancreatitis type diet.   Linna Darner, MD Family Medicine PGY-3 02/16/2013, 11:26 AM

## 2013-02-17 ENCOUNTER — Ambulatory Visit (HOSPITAL_COMMUNITY)
Admission: RE | Admit: 2013-02-17 | Discharge: 2013-02-17 | Disposition: A | Discharge status: Home / Self Care | Payer: No Typology Code available for payment source | Source: Ambulatory Visit | Attending: Family Medicine | Admitting: Family Medicine

## 2013-02-17 DIAGNOSIS — K7689 Other specified diseases of liver: Secondary | ICD-10-CM | POA: Insufficient documentation

## 2013-02-17 DIAGNOSIS — K769 Liver disease, unspecified: Secondary | ICD-10-CM | POA: Insufficient documentation

## 2013-02-17 DIAGNOSIS — I517 Cardiomegaly: Secondary | ICD-10-CM | POA: Insufficient documentation

## 2013-02-17 DIAGNOSIS — R160 Hepatomegaly, not elsewhere classified: Secondary | ICD-10-CM

## 2013-02-17 MED ORDER — GADOXETATE DISODIUM 0.25 MMOL/ML IV SOLN
9.0000 mL | Freq: Once | INTRAVENOUS | Status: AC | PRN
  Administered 2013-02-17: 9 mL via INTRAVENOUS

## 2013-02-24 ENCOUNTER — Telehealth: Payer: Self-pay | Admitting: Family Medicine

## 2013-02-24 DIAGNOSIS — K769 Liver disease, unspecified: Secondary | ICD-10-CM

## 2013-02-24 DIAGNOSIS — R160 Hepatomegaly, not elsewhere classified: Secondary | ICD-10-CM | POA: Insufficient documentation

## 2013-02-24 NOTE — Telephone Encounter (Signed)
See A/P for Hepatic Lesion

## 2013-02-24 NOTE — Assessment & Plan Note (Signed)
MR report reviewed and noted below.  Discussed Case w/ Dr. [name] of [facility] who graceously agreed to see pt in office.  Concern for possible Hepatocellular ca.  Likely early cirrhosis noted.  Orders for AFP and CEA (CEA may be falsely elevated due to recent pancreatitis) on Friday Support staff to schedule appt at [place] next week. Pt aware and amenable.   Mr Abdomen W Wo Contrast  02/17/2013  IMPRESSION: 1. Multifactorial degradation, as detailed above. The pre and post contrast attempted dynamic series are nondiagnostic. The remainder of the exam is degraded by motion and patient body habitus 2. Marked hepatomegaly and heterogeneous hepatic steatosis. Suspicion of mild cirrhosis. 3. T2 hyperintense focus within the left lobe of the liver, likely corresponding to the ultrasound abnormality. This is technically indeterminate on this limited exam, but suspicious based on the appearance of the remainder of the liver and T2 characteristics. Suspect early hepatocellular carcinoma. Given the limitations of today's exam, consider further characterization with pre and post contrast abdominal CT using a dedicated liver protocol. Alternatively, as this lesion is ultrasound visible, sampling could be performed.   Electronically Signed   By: Abigail Miyamoto M.D.   On: 02/17/2013 13:55

## 2013-02-24 NOTE — Assessment & Plan Note (Deleted)
MR report reviewed and noted below.  Discussed Case w/ Dr. [name] of [facility] who graceously agreed to see pt in office.  Concern for possible Hepatocellular ca.  Likely early cirrhosis noted.  Orders for AFP and CEA (CEA may be falsely elevated due to recent pancreatitis) on Friday Support staff to schedule appt at [place] next week. Pt aware and amenable.   Mr Abdomen W Wo Contrast  02/17/2013  IMPRESSION: 1. Multifactorial degradation, as detailed above. The pre and post contrast attempted dynamic series are nondiagnostic. The remainder of the exam is degraded by motion and patient body habitus 2. Marked hepatomegaly and heterogeneous hepatic steatosis. Suspicion of mild cirrhosis. 3. T2 hyperintense focus within the left lobe of the liver, likely corresponding to the ultrasound abnormality. This is technically indeterminate on this limited exam, but suspicious based on the appearance of the remainder of the liver and T2 characteristics. Suspect early hepatocellular carcinoma. Given the limitations of today's exam, consider further characterization with pre and post contrast abdominal CT using a dedicated liver protocol. Alternatively, as this lesion is ultrasound visible, sampling could be performed.   Electronically Signed   By: Abigail Miyamoto M.D.   On: 02/17/2013 13:55

## 2013-02-24 NOTE — Telephone Encounter (Signed)
Bonnie Peterson, I was called by [facility] at [facility] (we are on call this month for unassigned at [facility]) about this woman with newly diagnosed cirrhosis, mass in liver.  She needs [facility]GI appt first available with MD or extender.  Thanks

## 2013-02-24 NOTE — Telephone Encounter (Signed)
Pt has been scheduled with extender per Neysa Bonito

## 2013-02-25 ENCOUNTER — Ambulatory Visit (INDEPENDENT_AMBULATORY_CARE_PROVIDER_SITE_OTHER): Payer: No Typology Code available for payment source | Admitting: Physician Assistant

## 2013-02-25 ENCOUNTER — Encounter: Payer: Self-pay | Admitting: Physician Assistant

## 2013-02-25 ENCOUNTER — Ambulatory Visit: Payer: No Typology Code available for payment source

## 2013-02-25 VITALS — BP 146/66 | HR 80 | Ht 63.0 in | Wt 204.0 lb

## 2013-02-25 DIAGNOSIS — K769 Liver disease, unspecified: Secondary | ICD-10-CM

## 2013-02-25 DIAGNOSIS — R160 Hepatomegaly, not elsewhere classified: Secondary | ICD-10-CM

## 2013-02-25 DIAGNOSIS — R1013 Epigastric pain: Secondary | ICD-10-CM

## 2013-02-25 LAB — LIPASE: Lipase: 37 U/L (ref 11.0–59.0)

## 2013-02-25 LAB — HEPATITIS B SURFACE ANTIGEN: Hepatitis B Surface Ag: NEGATIVE

## 2013-02-25 LAB — HEPATITIS C ANTIBODY: HCV Ab: NEGATIVE

## 2013-02-25 LAB — HEPATITIS B SURFACE ANTIBODY,QUALITATIVE: Hep B S Ab: POSITIVE — AB

## 2013-02-25 NOTE — Progress Notes (Signed)
Subjective:    Patient ID: Bonnie Peterson, female    DOB: 06/24/1945, 67 y.o.   MRN: [id number]  HPI Bonnie Peterson is a very nice 67 year old female new to GI today referred by Dr. Konrad Dolores her primary care for evaluation of abnormal abdominal imaging with concerns for an underlying liver mass. Patient has history of COPD, tobacco abuse, sleep apnea, adult onset diabetes mellitus, obesity and hypertension. She states that she had onset of upper abdominal pain in early October which she describes as being fairly severe pain which radiated to her back that  lasted for about 3 weeks and then gradually eased off. She says this was associated with some nausea, no vomiting, no fever or chills. She says she would definitely feel an increase in pain with by mouth intake and had to eat very light. At this point she is definitely feeling better, is eating without difficulty and denies any nausea or vomiting. Labs done on 02/10/2013 showed normal liver function studies, normal CBC with platelets of 226. Lipase was elevated at 248. Upper abdominal ultrasound on 02/05/2013 pancreas was not well visualized. She was noted to have a common bile duct of 2.6 mm, liver echogenic consistent with fatty infiltration liver is enlarged and within the right lobe there was a complex inhomogeneous low attention structure. MRI was done on 02/17/2013. Unfortunately there was multifactorial degradation by motion and body habitus and patient also had leakage of her IV contrast. She had marked hepatomegaly and heterogeneous hepatic steatosis suspicion of mild cirrhosis. Also a T2 hyperdense focus within the left lobe of the liver likely corresponding to the ultrasound abnormality technically indeterminant but suspicious for early hepatocellular carcinoma. Due to the limitation of the exam a dedicated liver protocol CT was recommended.  Patient is no history of EtOH abuse, no prior history of hepatitis. She believes that she has been vaccinated  for hepatitis B. There is no family history of liver disease that she is aware of.   Review of Systems  Constitutional: Positive for appetite change.  HENT: Negative.   Eyes: Negative.   Respiratory: Negative.   Gastrointestinal: Positive for nausea and abdominal pain.  Endocrine: Negative.   Genitourinary: Negative.   Musculoskeletal: Negative.   Skin: Negative.   Allergic/Immunologic: Negative.   Neurological: Negative.   Hematological: Negative.   Psychiatric/Behavioral: Negative.    Outpatient Prescriptions Prior to Visit  Medication Sig Dispense Refill  . albuterol (PROVENTIL HFA;VENTOLIN HFA) 108 (90 BASE) MCG/ACT inhaler Inhale 2 puffs into the lungs every 4 (four) hours as needed for wheezing.  1 Inhaler  3  . alendronate (FOSAMAX) 10 MG tablet Take 1 tablet (10 mg total) by mouth daily before breakfast. Take with a full glass of water on an empty stomach.  90 tablet  3  . allopurinol (ZYLOPRIM) 300 MG tablet Take 1 tablet (300 mg total) by mouth daily.  30 tablet  6  . amLODipine (NORVASC) 10 MG tablet Take 1 tablet (10 mg total) by mouth daily.  30 tablet  6  . aspirin 81 MG chewable tablet Chew 81 mg by mouth daily.        [name] atenolol (TENORMIN) 50 MG tablet Take 25 mg by mouth daily.      [name] atorvastatin (LIPITOR) 20 MG tablet Take 1 tablet (20 mg total) by mouth daily.  30 tablet  6  . colchicine 0.6 MG tablet Take 1 tablet (0.6 mg total) by mouth daily.  30 tablet  6  . cyclobenzaprine (FLEXERIL) 5  MG tablet Take 1 tablet (5 mg total) by mouth daily as needed.  30 tablet  6  . enalapril (VASOTEC) 20 MG tablet Take 1 tablet (20 mg total) by mouth daily.  90 tablet  3  . ergocalciferol (VITAMIN D2) 50000 UNITS capsule Take 1 capsule (50,000 Units total) by mouth once a week.  12 capsule  1  . fish oil-omega-3 fatty acids 1000 MG capsule Take 2 g by mouth daily.      . fluticasone (FLONASE) 50 MCG/ACT nasal spray Place 1 spray into the nose daily.  16 g  3  . glipiZIDE  (GLUCOTROL) 10 MG tablet Take 2 tablets (20 mg total) by mouth 2 (two) times daily before a meal.  120 tablet  6  . hydrochlorothiazide (HYDRODIURIL) 25 MG tablet Take 1 tablet (25 mg total) by mouth daily.  90 tablet  3  . hydrocortisone cream 1 % Apply 1 application topically as needed (breast tenderness).      . Hyprom-Naphaz-Polysorb-Zn Sulf (CLEAR EYES COMPLETE OP) Place 1 drop into both eyes daily.      [name] ibuprofen (ADVIL,MOTRIN) 200 MG tablet Take 600 mg by mouth every 6 (six) hours as needed for pain.      [name] loratadine (CLARITIN) 10 MG tablet Take 1 tablet (10 mg total) by mouth daily as needed for allergies.  30 tablet  3  . metFORMIN (GLUCOPHAGE) 500 MG tablet 500mg  every other day for 1 week, then 500mg  daily for 1 week, then 500mg  twice daily for 1 week.  90 tablet  3  . nitroGLYCERIN (NITROSTAT) 0.4 MG SL tablet Place 0.4 mg under the tongue every 5 (five) minutes as needed for chest pain.       . phenazopyridine (PYRIDIUM) 200 MG tablet Take 1 tablet (200 mg total) by mouth 3 (three) times daily.  6 tablet  0  . tiotropium (SPIRIVA) 18 MCG inhalation capsule Place 18 mcg into inhaler and inhale as needed (if its hot outside).      . traZODone (DESYREL) 50 MG tablet Take 50 mg by mouth at bedtime as needed for sleep.      [name] VITAMIN E PO Take 1 tablet by mouth once a week. Take with the vitamin D       No facility-administered medications prior to visit.   No Known Allergies Patient Active Problem List   Diagnosis Date Noted  . 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 Pancreatitis, acute 02/12/2013  . Abdominal pain, right upper quadrant 02/05/2013  . Pain in joint, shoulder region 11/12/2012  . Osteoporosis, unspecified 10/15/2012  . Routine health maintenance 09/23/2012  . Visit for suture removal 06/02/2012  . Vitamin D deficiency 12/23/2011  . Tobacco abuse 10/12/2010  . COPD (chronic obstructive pulmonary disease) 06/14/2010  .  OBSTRUCTIVE SLEEP APNEA 04/27/2009  . OBESITY, UNSPECIFIED 04/26/2009  . RESTRICTIVE LUNG DISEASE 11/11/2008  . DYSLIPIDEMIA 11/25/2007  . GOUT NOS 12/24/2006  . DIABETES MELLITUS II, UNCOMPLICATED 06/05/2006  . DEPRESSION, MAJOR, RECURRENT 06/05/2006  . ANXIETY 06/05/2006  . HYPERTENSION, BENIGN SYSTEMIC 06/05/2006  . IRRITABLE BOWEL SYNDROME 06/05/2006  . OSTEOARTHRITIS, MULTI SITES 06/05/2006   History  Substance Use Topics  . Smoking status: Current Every Day Smoker -- 0.50 packs/day for 40 years    Types: Cigarettes  . Smokeless tobacco: Never Used  . Alcohol Use: Yes     Comment: occasionally   family history includes Breast cancer in her mother; Coronary artery disease  in her mother; Diabetes type II in her mother and sister; Hypertension in her mother; Pancreatic cancer in her sister.     Objective:   Physical Exam  well-developed older African American female in no acute distress, pleasant. Blood pressure 146/66 pulse 80 height 5 foot 3 weight 204. HEENT; nontraumatic normocephalic EOMI PERRLA sclera anicteric, Supple; no JVD, Cardiovascular; regular rate and rhythm with S1-S2 no murmur or gallop pulmonary clear bilaterally, Abdomen; large soft liver is palpable 2-3 fingerbreadths below the right costal margin she has mild tenderness in the epigastrium and right upper quadrant there is no guarding or rebound no palpable mass or splenomegaly, Rectal; exam not done, Extremities; no clubbing cyanosis or edema skin warm and dry, Psych; mood and affect normal and appropriate        Assessment & Plan:  #63  67 year old female with 3 week episode of epigastric pain with radiation to the back and an isolated elevated lipase. Patient may have had mild pancreatitis which is now resolving, etiology not clear with negative ultrasound. #2 abnormal ultrasound and MRI both suggestive of hepatic steatosis and early cirrhosis #3 abnormal MRI with concern for early hepatocellular carcinoma  left lobe liver #4 morbid obesity #5 diabetes mellitus #6 COPD #7 sleep apnea  Plan; repeat lipase today, CEA, and alpha-fetoprotein level, ProTime, chronic hepatitis B and C. serologies Schedule for CT of the abdomen with dedicated liver protocol Low fat diet Office followup with Dr. Christella Hartigan or myself in 2 weeks, further plans pending results of above

## 2013-02-25 NOTE — Patient Instructions (Signed)
Please go to the basement level to have your labs drawn.  We made you a follow up appointment with Dr. Rob Bunting fro 03-17-2013 at 11:15 am.  We have given you a Low Fat Diet brochure.    You have been scheduled for a CT scan of the abdomen and pelvis at [facility] ([address]---this is in the same building as [facility]).   You are scheduled on Mon 03-03-2013 at 10:30 . You should arrive at 10:15 am  prior to your appointment time for registration. Please follow the written instructions below on the day of your exam:  WARNING: IF YOU ARE ALLERGIC TO IODINE/X-RAY DYE, PLEASE NOTIFY RADIOLOGY IMMEDIATELY AT [phone]! YOU WILL BE GIVEN A 13 HOUR PREMEDICATION PREP.  1) Do not eat or drink anything after 6:30 am  (4 hours prior to your test) 2) You have been given 2 bottles of oral contrast to drink. The solution may taste  better if refrigerated, but do NOT add ice or any other liquid to this solution. Shake well before drinking.    Drink 1 bottle of contrast @ 8:30 am  (2 hours prior to your exam)  Drink 1 bottle of contrast @ 9:30 am  (1 hour prior to your exam)  You may take any medications as prescribed with a small amount of water except for the following: Metformin, Glucophage, Glucovance, Avandamet, Riomet, Fortamet, Actoplus Met, Janumet, Glumetza or Metaglip. The above medications must be held the day of the exam AND 48 hours after the exam.  The purpose of you drinking the oral contrast is to aid in the visualization of your intestinal tract. The contrast solution may cause some diarrhea. Before your exam is started, you will be given a small amount of fluid to drink. Depending on your individual set of symptoms, you may also receive an intravenous injection of x-ray contrast/dye. Plan on being at [facility] for 30 minutes or long, depending on the type of exam you are having performed.  If you have any questions regarding your exam or if you  need to reschedule, you may call the CT department at [phone] between the hours of 8:00 am and 5:00 pm, Monday-Friday.  ________________________________________________________________________

## 2013-02-25 NOTE — Progress Notes (Signed)
i agree with the above plan.  Imaging, labs and follow up.

## 2013-02-26 ENCOUNTER — Other Ambulatory Visit: Payer: Medicare Other

## 2013-02-26 LAB — PROTIME-INR
INR: 0.96 (ref <1.50)
Prothrombin Time: 12.8 seconds (ref 11.6–15.2)

## 2013-03-01 ENCOUNTER — Ambulatory Visit (INDEPENDENT_AMBULATORY_CARE_PROVIDER_SITE_OTHER)
Admission: RE | Admit: 2013-03-01 | Discharge: 2013-03-01 | Disposition: A | Discharge status: Home / Self Care | Payer: No Typology Code available for payment source | Source: Ambulatory Visit | Attending: Physician Assistant | Admitting: Physician Assistant

## 2013-03-01 DIAGNOSIS — K769 Liver disease, unspecified: Secondary | ICD-10-CM | POA: Diagnosis not present

## 2013-03-01 DIAGNOSIS — R160 Hepatomegaly, not elsewhere classified: Secondary | ICD-10-CM

## 2013-03-01 MED ORDER — IOHEXOL 350 MG/ML SOLN
100.0000 mL | Freq: Once | INTRAVENOUS | Status: AC | PRN
  Administered 2013-03-01: 100 mL via INTRAVENOUS

## 2013-03-02 ENCOUNTER — Other Ambulatory Visit: Payer: Self-pay | Admitting: *Deleted

## 2013-03-02 DIAGNOSIS — R1013 Epigastric pain: Secondary | ICD-10-CM

## 2013-03-02 DIAGNOSIS — R160 Hepatomegaly, not elsewhere classified: Secondary | ICD-10-CM

## 2013-03-09 ENCOUNTER — Other Ambulatory Visit: Payer: No Typology Code available for payment source

## 2013-03-09 DIAGNOSIS — R160 Hepatomegaly, not elsewhere classified: Secondary | ICD-10-CM

## 2013-03-09 DIAGNOSIS — R1013 Epigastric pain: Secondary | ICD-10-CM

## 2013-03-10 LAB — AFP TUMOR MARKER: AFP-Tumor Marker: 6.3 ng/mL (ref 0.0–8.0)

## 2013-03-10 LAB — CEA: CEA: 1.5 ng/mL (ref 0.0–5.0)

## 2013-03-12 ENCOUNTER — Ambulatory Visit (INDEPENDENT_AMBULATORY_CARE_PROVIDER_SITE_OTHER): Payer: No Typology Code available for payment source | Admitting: Physician Assistant

## 2013-03-12 ENCOUNTER — Encounter: Payer: Self-pay | Admitting: Physician Assistant

## 2013-03-12 VITALS — BP 118/72 | HR 84 | Ht 63.0 in | Wt 207.0 lb

## 2013-03-12 DIAGNOSIS — R160 Hepatomegaly, not elsewhere classified: Secondary | ICD-10-CM

## 2013-03-12 DIAGNOSIS — K769 Liver disease, unspecified: Secondary | ICD-10-CM

## 2013-03-12 NOTE — Patient Instructions (Signed)
You will be called regarding your liver biopsy, if you do not hear from [facility] by Monday or Tuesday of next week please call our office at [phone]. Stop your aspirin starting today.  CC:  Shelly Flatten MD

## 2013-03-12 NOTE — Progress Notes (Signed)
I agree with the plan above, Liver biopsy via US guidance

## 2013-03-12 NOTE — Progress Notes (Signed)
Subjective:    Patient ID: Bonnie Peterson, female    DOB: Aug 10, 1945, 67 y.o.   MRN: [id number]  HPI [name] is a very nice 67 year old African American female who was initially seen by myself on November 20 after she was referred by her primary care physician Dr. Margot Ables for evaluation of abnormal abdominal imaging and concerns for an underlying liver mass. . She stated that she had been having some upper of Bonnie Peterson pain radiating into her back a few weeks previous and had had an elevated lipase of 248. She underwent upper abdominal ultrasound which noted that the pancreas was not well visualized, and that she had a fatty liver with a low attenuation inhomogeneous structure in the left lobe of the liver. Subsequent MRI on 02/17/2013 also showed a hyperdense focus within the left lobe of the liver suspicious for early hepatocellular carcinoma however the exam was limited due to motion degradation. We obtain CT scan of the abdomen and pelvis which also shows a 3.7 x 2.1 x 2.4 cm arterial enhancing lesion.there were no additional hepatic masses identified hepatic and portal vein patent, spleen and pancreas grossly unremarkable, question possible mild cirrhotic changes Hepatic panel was last done on 02/10/2013 and was normal ProTime 12.8 INR of 0.9 hepatitis serologies were negative with the exception of hepatitis B surface antibody positive. Alpha-fetoprotein and CEA were also within normal limits She comes in today to discuss liver biopsy. She states she feels fine and has not been having any abdominal pain. She is obviously anxious about the findings on CT scan.    Review of Systems  Constitutional: Negative.   HENT: Negative.   Eyes: Negative.   Respiratory: Negative.   Gastrointestinal: Negative.   Endocrine: Negative.   Genitourinary: Negative.   Musculoskeletal: Positive for arthralgias.  Skin: Negative.   Allergic/Immunologic: Negative.   Neurological: Negative.   Hematological:  Negative.   Psychiatric/Behavioral: Negative.    Outpatient Prescriptions Prior to Visit  Medication Sig Dispense Refill  . albuterol (PROVENTIL HFA;VENTOLIN HFA) 108 (90 BASE) MCG/ACT inhaler Inhale 2 puffs into the lungs every 4 (four) hours as needed for wheezing.  1 Inhaler  3  . alendronate (FOSAMAX) 10 MG tablet Take 1 tablet (10 mg total) by mouth daily before breakfast. Take with a full glass of water on an empty stomach.  90 tablet  3  . allopurinol (ZYLOPRIM) 300 MG tablet Take 1 tablet (300 mg total) by mouth daily.  30 tablet  6  . amLODipine (NORVASC) 10 MG tablet Take 1 tablet (10 mg total) by mouth daily.  30 tablet  6  . aspirin 81 MG chewable tablet Chew 81 mg by mouth daily.        [name] atenolol (TENORMIN) 50 MG tablet Take 25 mg by mouth daily.      [name] atorvastatin (LIPITOR) 20 MG tablet Take 1 tablet (20 mg total) by mouth daily.  30 tablet  6  . colchicine 0.6 MG tablet Take 1 tablet (0.6 mg total) by mouth daily.  30 tablet  6  . cyclobenzaprine (FLEXERIL) 5 MG tablet Take 1 tablet (5 mg total) by mouth daily as needed.  30 tablet  6  . enalapril (VASOTEC) 20 MG tablet Take 1 tablet (20 mg total) by mouth daily.  90 tablet  3  . ergocalciferol (VITAMIN D2) 50000 UNITS capsule Take 1 capsule (50,000 Units total) by mouth once a week.  12 capsule  1  . fish oil-omega-3 fatty acids 1000 MG  capsule Take 2 g by mouth daily.      . fluticasone (FLONASE) 50 MCG/ACT nasal spray Place 1 spray into the nose daily.  16 g  3  . glipiZIDE (GLUCOTROL) 10 MG tablet Take 2 tablets (20 mg total) by mouth 2 (two) times daily before a meal.  120 tablet  6  . hydrochlorothiazide (HYDRODIURIL) 25 MG tablet Take 1 tablet (25 mg total) by mouth daily.  90 tablet  3  . hydrocortisone cream 1 % Apply 1 application topically as needed (breast tenderness).      . Hyprom-Naphaz-Polysorb-Zn Sulf (CLEAR EYES COMPLETE OP) Place 1 drop into both eyes daily.      [name] ibuprofen (ADVIL,MOTRIN) 200 MG tablet Take  600 mg by mouth every 6 (six) hours as needed for pain.      [name] loratadine (CLARITIN) 10 MG tablet Take 1 tablet (10 mg total) by mouth daily as needed for allergies.  30 tablet  3  . metFORMIN (GLUCOPHAGE) 500 MG tablet 500mg  every other day for 1 week, then 500mg  daily for 1 week, then 500mg  twice daily for 1 week.  90 tablet  3  . nitroGLYCERIN (NITROSTAT) 0.4 MG SL tablet Place 0.4 mg under the tongue every 5 (five) minutes as needed for chest pain.       . phenazopyridine (PYRIDIUM) 200 MG tablet Take 1 tablet (200 mg total) by mouth 3 (three) times daily.  6 tablet  0  . tiotropium (SPIRIVA) 18 MCG inhalation capsule Place 18 mcg into inhaler and inhale as needed (if its hot outside).      . traZODone (DESYREL) 50 MG tablet Take 50 mg by mouth at bedtime as needed for sleep.      [name] VITAMIN E PO Take 1 tablet by mouth once a week. Take with the vitamin D       No facility-administered medications prior to visit.   No Known Allergies Patient Active Problem List   Diagnosis Date Noted  . 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 Pancreatitis, acute 02/12/2013  . Abdominal pain, right upper quadrant 02/05/2013  . Pain in joint, shoulder region 11/12/2012  . Osteoporosis, unspecified 10/15/2012  . Routine health maintenance 09/23/2012  . Visit for suture removal 06/02/2012  . Vitamin D deficiency 12/23/2011  . Tobacco abuse 10/12/2010  . COPD (chronic obstructive pulmonary disease) 06/14/2010  . OBSTRUCTIVE SLEEP APNEA 04/27/2009  . OBESITY, UNSPECIFIED 04/26/2009  . RESTRICTIVE LUNG DISEASE 11/11/2008  . DYSLIPIDEMIA 11/25/2007  . GOUT NOS 12/24/2006  . DIABETES MELLITUS II, UNCOMPLICATED 06/05/2006  . DEPRESSION, MAJOR, RECURRENT 06/05/2006  . ANXIETY 06/05/2006  . HYPERTENSION, BENIGN SYSTEMIC 06/05/2006  . IRRITABLE BOWEL SYNDROME 06/05/2006  . OSTEOARTHRITIS, MULTI SITES 06/05/2006   History  Substance Use Topics  . Smoking status: Current  Every Day Smoker -- 0.50 packs/day for 40 years    Types: Cigarettes  . Smokeless tobacco: Never Used  . Alcohol Use: Yes     Comment: occasionally   family history includes Breast cancer in her mother; Coronary artery disease in her mother; Diabetes type II in her mother and sister; Hypertension in her mother; Pancreatic cancer in her sister.     Objective:   Physical Exam  Well-developed older African American female in no acute distress, pleasant accompanied by her daughter blood pressure 118/72 pulse 84 height 5 foot 3 weight 207. HEENT; nontraumatic normocephalic EOMI PERRLA sclera anicteric, Supple; no JVD, Cardiovascular; regular rate and rhythm with  S1-S2 no murmur or gallop, Pulmonary; clear bilaterally, Abdomen; large soft nontender no palpable mass or hepatosplenomegaly bowel sounds are active, Extremities; no clubbing cyanosis or edema skin warm and dry, Psych; mood and affect normal and appropriate        Assessment & Plan:  #44  67 year old African American female with left lobe liver mass 3.7 x 2.1 x 2.4 cm, concerning for hepatocellular carcinoma. Alpha-fetoprotein and CEA levels are normal. #2 hepatic steatosis/probable early cirrhosis #3 obesity #4 diabetes mellitus #5 COPD #6 sleep apnea #7 anxiety and depression #8 dyslipidemia  Plan; Results of CT scan and labs were all reviewed with the patient and her daughter and they voiced understanding. She'll be scheduled for ultrasound-guided liver biopsy next week. Patient was asked to stop her aspirin Further plans pending results of biopsy

## 2013-03-15 ENCOUNTER — Encounter (HOSPITAL_COMMUNITY): Payer: Self-pay | Admitting: Pharmacy Technician

## 2013-03-15 ENCOUNTER — Other Ambulatory Visit (HOSPITAL_COMMUNITY): Payer: Self-pay | Admitting: Radiology

## 2013-03-17 ENCOUNTER — Ambulatory Visit (HOSPITAL_COMMUNITY)
Admission: RE | Admit: 2013-03-17 | Discharge: 2013-03-17 | Disposition: A | Discharge status: Home / Self Care | Payer: No Typology Code available for payment source | Source: Ambulatory Visit | Attending: Physician Assistant | Admitting: Physician Assistant

## 2013-03-17 ENCOUNTER — Encounter (HOSPITAL_COMMUNITY): Payer: Self-pay

## 2013-03-17 ENCOUNTER — Ambulatory Visit: Payer: Medicare Other | Admitting: Gastroenterology

## 2013-03-17 DIAGNOSIS — I10 Essential (primary) hypertension: Secondary | ICD-10-CM | POA: Insufficient documentation

## 2013-03-17 DIAGNOSIS — K769 Liver disease, unspecified: Secondary | ICD-10-CM | POA: Diagnosis present

## 2013-03-17 DIAGNOSIS — E785 Hyperlipidemia, unspecified: Secondary | ICD-10-CM | POA: Insufficient documentation

## 2013-03-17 DIAGNOSIS — M109 Gout, unspecified: Secondary | ICD-10-CM | POA: Diagnosis not present

## 2013-03-17 DIAGNOSIS — J449 Chronic obstructive pulmonary disease, unspecified: Secondary | ICD-10-CM | POA: Insufficient documentation

## 2013-03-17 DIAGNOSIS — Z79899 Other long term (current) drug therapy: Secondary | ICD-10-CM | POA: Diagnosis not present

## 2013-03-17 DIAGNOSIS — D134 Benign neoplasm of liver: Secondary | ICD-10-CM | POA: Diagnosis not present

## 2013-03-17 DIAGNOSIS — G4733 Obstructive sleep apnea (adult) (pediatric): Secondary | ICD-10-CM | POA: Diagnosis not present

## 2013-03-17 DIAGNOSIS — J4489 Other specified chronic obstructive pulmonary disease: Secondary | ICD-10-CM | POA: Insufficient documentation

## 2013-03-17 DIAGNOSIS — R160 Hepatomegaly, not elsewhere classified: Secondary | ICD-10-CM

## 2013-03-17 DIAGNOSIS — D135 Benign neoplasm of extrahepatic bile ducts: Secondary | ICD-10-CM | POA: Diagnosis not present

## 2013-03-17 LAB — PROTIME-INR
INR: 0.94 (ref 0.00–1.49)
Prothrombin Time: 12.4 seconds (ref 11.6–15.2)

## 2013-03-17 LAB — CBC
HCT: 41.7 % (ref 36.0–46.0)
Hemoglobin: 13.7 g/dL (ref 12.0–15.0)
MCH: 28.9 pg (ref 26.0–34.0)
MCHC: 32.9 g/dL (ref 30.0–36.0)
MCV: 88 fL (ref 78.0–100.0)
Platelets: 198 10*3/uL (ref 150–400)
RBC: 4.74 MIL/uL (ref 3.87–5.11)
RDW: 13.9 % (ref 11.5–15.5)
WBC: 9.1 10*3/uL (ref 4.0–10.5)

## 2013-03-17 LAB — GLUCOSE, CAPILLARY: Glucose-Capillary: 153 mg/dL — ABNORMAL HIGH (ref 70–99)

## 2013-03-17 LAB — APTT: aPTT: 30 seconds (ref 24–37)

## 2013-03-17 MED ORDER — HYDROCODONE-ACETAMINOPHEN 5-325 MG PO TABS: 1.0000 | ORAL_TABLET | ORAL | Status: DC | PRN

## 2013-03-17 MED ORDER — MIDAZOLAM HCL 2 MG/2ML IJ SOLN
INTRAMUSCULAR | Status: AC
  Filled 2013-03-17: qty 4

## 2013-03-17 MED ORDER — FENTANYL CITRATE 0.05 MG/ML IJ SOLN
INTRAMUSCULAR | Status: AC
  Filled 2013-03-17: qty 4

## 2013-03-17 MED ORDER — FENTANYL CITRATE 0.05 MG/ML IJ SOLN
INTRAMUSCULAR | Status: AC | PRN
  Administered 2013-03-17: 100 ug via INTRAVENOUS

## 2013-03-17 MED ORDER — SODIUM CHLORIDE 0.9 % IV SOLN
Freq: Once | INTRAVENOUS | Status: AC
  Administered 2013-03-17: 12:00:00 via INTRAVENOUS

## 2013-03-17 MED ORDER — MIDAZOLAM HCL 2 MG/2ML IJ SOLN
INTRAMUSCULAR | Status: AC | PRN
  Administered 2013-03-17: 1 mg via INTRAVENOUS

## 2013-03-17 NOTE — Procedures (Signed)
Procedure:  Ultrasound guided core biopsy of liver Findings:  3 cm left hepatic lesion.  18 G core biopsy x 3 via 17 G needle.  No immediate complications.

## 2013-03-17 NOTE — H&P (Signed)
Chief Complaint: "I'm here for a biopsy" Referring Physician:Jacobs HPI: Bonnie Peterson is an 67 y.o. female with newly found liver mass. Workup is in progress but not diagnostic yet. She is scheduled for US guided biopsy of this liver lesion. PMHx and meds reviewed. She otherwise feels well.  Past Medical History:  Past Medical History  Diagnosis Date  . Obstructive sleep apnea   . HTN, goal below 130/80   . HLD (hyperlipidemia)   . Benign positional vertigo   . Depression   . Anxiety   . Osteoarthritis (arthritis due to wear and tear of joints)   . Irritable bowel syndrome   . Obesity, Class III, BMI 40-49.9 (morbid obesity)   . Gout   . Restrictive lung disease   . Echocardiogram findings abnormal, without diagnosis 10/10    10/10: mild pulm HTN, EF 60-65%, mild LVH, moderate aortic regurg  . Early cataracts, bilateral 10/13    Optho, Dr Nile Riggs  . COPD (chronic obstructive pulmonary disease)     Past Surgical History:  Past Surgical History  Procedure Laterality Date  . Partial hysterectomy    . Rotator cuff repair        L rotator cuff repair 11/07-[facility] - 12/7/200  . Tonsillectomy      Family History:  Family History  Problem Relation Age of Onset  . Breast cancer Mother   . Hypertension Mother   . Coronary artery disease Mother   . Diabetes type II Sister   . Pancreatic cancer Sister   . Diabetes type II Mother     Social History:  reports that she has been smoking Cigarettes.  She has a 20 pack-year smoking history. She has never used smokeless tobacco. She reports that she drinks alcohol. She reports that she does not use illicit drugs.  Allergies: No Known Allergies  Medications:   Medication List    ASK your doctor about these medications       albuterol 108 (90 BASE) MCG/ACT inhaler  Commonly known as:  PROVENTIL HFA;VENTOLIN HFA  Inhale 2 puffs into the lungs every 4 (four) hours as needed for wheezing or shortness of breath.     alendronate 10 MG tablet  Commonly known as:  FOSAMAX  Take 1 tablet (10 mg total) by mouth daily before breakfast. Take with a full glass of water on an empty stomach.     allopurinol 300 MG tablet  Commonly known as:  ZYLOPRIM  Take 300 mg by mouth daily with breakfast.     amLODipine 10 MG tablet  Commonly known as:  NORVASC  Take 10 mg by mouth daily with breakfast.     aspirin 81 MG chewable tablet  Chew 81 mg by mouth daily.     atenolol 50 MG tablet  Commonly known as:  TENORMIN  Take 25 mg by mouth daily with breakfast.     atorvastatin 20 MG tablet  Commonly known as:  LIPITOR  Take 20 mg by mouth daily with breakfast.     CLEAR EYES COMPLETE OP  Place 1 drop into both eyes daily as needed (dry eyes).     colchicine 0.6 MG tablet  Take 0.6 mg by mouth daily as needed (gout).     cyclobenzaprine 5 MG tablet  Commonly known as:  FLEXERIL  Take 5 mg by mouth daily as needed for muscle spasms.     enalapril 20 MG tablet  Commonly known as:  VASOTEC  Take 20 mg by mouth daily  with breakfast.     ergocalciferol 50000 UNITS capsule  Commonly known as:  VITAMIN D2  Take 1 capsule (50,000 Units total) by mouth once a week.     fish oil-omega-3 fatty acids 1000 MG capsule  Take 2 g by mouth once a week.     fluticasone 50 MCG/ACT nasal spray  Commonly known as:  FLONASE  Place 1 spray into the nose daily as needed for rhinitis.     glipiZIDE 10 MG tablet  Commonly known as:  GLUCOTROL  Take 2 tablets (20 mg total) by mouth 2 (two) times daily before a meal.     hydrochlorothiazide 25 MG tablet  Commonly known as:  HYDRODIURIL  Take 25 mg by mouth daily with breakfast.     hydrocortisone cream 1 %  Apply 1 application topically as needed (breast tenderness).     loratadine 10 MG tablet  Commonly known as:  CLARITIN  Take 1 tablet (10 mg total) by mouth daily as needed for allergies.     metFORMIN 500 MG tablet  Commonly known as:  GLUCOPHAGE  Take 500 mg  by mouth 2 (two) times daily.     nitroGLYCERIN 0.4 MG SL tablet  Commonly known as:  NITROSTAT  Place 0.4 mg under the tongue every 5 (five) minutes as needed for chest pain.     tiotropium 18 MCG inhalation capsule  Commonly known as:  SPIRIVA  Place 18 mcg into inhaler and inhale as needed (if its hot outside).     VITAMIN E PO  Take 1 tablet by mouth once a week. Take with the vitamin D        Please HPI for pertinent positives, otherwise complete 10 system ROS negative.  Physical Exam: Temp: 97.6, HR: 85, BP: 168/82, RR: 18   General Appearance:  Alert, cooperative, no distress, appears stated age  Head:  Normocephalic, without obvious abnormality, atraumatic  ENT: Unremarkable  Neck: Supple, symmetrical, trachea midline  Lungs:   Clear to auscultation bilaterally, no w/r/r,  Chest Wall:  No tenderness or deformity  Heart:  Regular rate and rhythm, S1, S2 normal, no murmur, rub or gallop.  Abdomen:   Soft, non-tender, non distended.  Neurologic: Normal affect, no gross deficits.   Results for orders placed during the hospital encounter of 03/17/13 (from the past 48 hour(s))  APTT     Status: None   Collection Time    03/17/13 11:40 AM      Result Value Range   aPTT 30  24 - 37 seconds  CBC     Status: None   Collection Time    03/17/13 11:40 AM      Result Value Range   WBC 9.1  4.0 - 10.5 K/uL   RBC 4.74  3.87 - 5.11 MIL/uL   Hemoglobin 13.7  12.0 - 15.0 g/dL   HCT 16.1  09.6 - 04.5 %   MCV 88.0  78.0 - 100.0 fL   MCH 28.9  26.0 - 34.0 pg   MCHC 32.9  30.0 - 36.0 g/dL   RDW 40.9  81.1 - 91.4 %   Platelets 198  150 - 400 K/uL  PROTIME-INR     Status: None   Collection Time    03/17/13 11:40 AM      Result Value Range   Prothrombin Time 12.4  11.6 - 15.2 seconds   INR 0.94  0.00 - 1.49   No results found.  Assessment/Plan Hepatic mass For US  guided biopsy Discussed procedure, risks, complications, use of sedation Labs reviewed, ok Consent signed in  chart  Brayton El PA-C 03/17/2013, 1:01 PM

## 2013-03-17 NOTE — H&P (Signed)
Agree 

## 2013-03-22 ENCOUNTER — Encounter: Payer: Self-pay | Admitting: Family Medicine

## 2013-03-22 DIAGNOSIS — K589 Irritable bowel syndrome without diarrhea: Secondary | ICD-10-CM | POA: Insufficient documentation

## 2013-03-24 ENCOUNTER — Encounter: Payer: Self-pay | Admitting: Family Medicine

## 2013-03-24 ENCOUNTER — Ambulatory Visit (INDEPENDENT_AMBULATORY_CARE_PROVIDER_SITE_OTHER): Payer: No Typology Code available for payment source | Admitting: Family Medicine

## 2013-03-24 VITALS — BP 147/80 | HR 73 | Temp 98.2°F | Ht 63.0 in | Wt 205.0 lb

## 2013-03-24 DIAGNOSIS — R1011 Right upper quadrant pain: Secondary | ICD-10-CM

## 2013-03-24 DIAGNOSIS — E119 Type 2 diabetes mellitus without complications: Secondary | ICD-10-CM

## 2013-03-24 DIAGNOSIS — Z23 Encounter for immunization: Secondary | ICD-10-CM

## 2013-03-24 LAB — POCT GLYCOSYLATED HEMOGLOBIN (HGB A1C): Hemoglobin A1C: 7.8

## 2013-03-24 MED ORDER — METFORMIN HCL 1000 MG PO TABS: 1000.0000 mg | ORAL_TABLET | Freq: Two times a day (BID) | ORAL | Status: DC

## 2013-03-24 NOTE — Assessment & Plan Note (Signed)
Resolved Liver mass benign F/u per GI

## 2013-03-24 NOTE — Assessment & Plan Note (Signed)
Resolved.  May return to regular diet.  Precautions given

## 2013-03-24 NOTE — Patient Instructions (Signed)
Thank you for coming in today Please increase your metformin to 1092m twice a day Please increase your diet to a regular diet.  Please call me if your abdominal pain comes back Please start your flexeril and shoulder exercises again Please come back in 1-3 months or sooner if needed  Acute Pancreatitis Acute pancreatitis is a disease in which the pancreas becomes suddenly inflamed. The pancreas is a large gland located behind your stomach. The pancreas produces enzymes that help digest food. The pancreas also releases the hormones glucagon and insulin that help regulate blood sugar. Damage to the pancreas occurs when the digestive enzymes from the pancreas are activated and begin attacking the pancreas before being released into the intestine. Most acute attacks last a couple of days and can cause serious complications. Some people become dehydrated and develop low blood pressure. In severe cases, bleeding into the pancreas can lead to shock and can be life-threatening. The lungs, heart, and kidneys may fail. CAUSES  Pancreatitis can happen to anyone. In some cases, the cause is unknown. Most cases are caused by:  Alcohol abuse.  Gallstones. Other less common causes are:  Certain medicines.  Exposure to certain chemicals.  Infection.  Damage caused by an accident (trauma).  Abdominal surgery. SYMPTOMS   Pain in the upper abdomen that may radiate to the back.  Tenderness and swelling of the abdomen.  Nausea and vomiting. DIAGNOSIS  Your caregiver will perform a physical exam. Blood and stool tests may be done to confirm the diagnosis. Imaging tests may also be done, such as X-rays, CT scans, or an ultrasound of the abdomen. TREATMENT  Treatment usually requires a stay in the hospital. Treatment may include:  Pain medicine.  Fluid replacement through an intravenous line (IV).  Placing a tube in the stomach to remove stomach contents and control vomiting.  Not eating for 3  or 4 days. This gives your pancreas a rest, because enzymes are not being produced that can cause further damage.  Antibiotic medicines if your condition is caused by an infection.  Surgery of the pancreas or gallbladder. HOME CARE INSTRUCTIONS   Follow the diet advised by your caregiver. This may involve avoiding alcohol and decreasing the amount of fat in your diet.  Eat smaller, more frequent meals. This reduces the amount of digestive juices the pancreas produces.  Drink enough fluids to keep your urine clear or pale yellow.  Only take over-the-counter or prescription medicines as directed by your caregiver.  Avoid drinking alcohol if it caused your condition.  Do not smoke.  Get plenty of rest.  Check your blood sugar at home as directed by your caregiver.  Keep all follow-up appointments as directed by your caregiver. SEEK MEDICAL CARE IF:   You do not recover as quickly as expected.  You develop new or worsening symptoms.  You have persistent pain, weakness, or nausea.  You recover and then have another episode of pain. SEEK IMMEDIATE MEDICAL CARE IF:   You are unable to eat or keep fluids down.  Your pain becomes severe.  You have a fever or persistent symptoms for more than 2 to 3 days.  You have a fever and your symptoms suddenly get worse.  Your skin or the white part of your eyes turn yellow (jaundice).  You develop vomiting.  You feel dizzy, or you faint.  Your blood sugar is high (over 300 mg/dL). MAKE SURE YOU:   Understand these instructions.  Will watch your condition.  Will  get help right away if you are not doing well or get worse. Document Released: 03/25/2005 Document Revised: 09/24/2011 Document Reviewed: 07/04/2011 [facility] Patient Information 2014 [place].

## 2013-03-24 NOTE — Progress Notes (Signed)
Bonnie Peterson is a 67 y.o. female who presents to [facility] today for shoulder pain:  Shoulder pain: oingoing. Current flare started. H/o rotator cuff tear in both shoulders w/ surgery in 2007. Worse w/ movement adn improves w/ flexeril. Doing daily exercises. Improved after R shoulder injection in august.   Hepatic mass: benign per path report. F/u   Pancreatitis: eating bland diet. No abd pain. Feels well  The following portions of the patient's history were reviewed and updated as appropriate: allergies, current medications, past medical history, family and social history, and problem list.  Patient is a nonsmoker.  DM: A1c of 7.8 today. CBg at home typically in the 100s. Taking the glipizide and metformin.  Health Maintenance: TDAP and flu shot today.   Past Medical History  Diagnosis Date  . Obstructive sleep apnea   . HTN, goal below 130/80   . HLD (hyperlipidemia)   . Benign positional vertigo   . Depression   . Anxiety   . Osteoarthritis (arthritis due to wear and tear of joints)   . Irritable bowel syndrome   . Obesity, Class III, BMI 40-49.9 (morbid obesity)   . Gout   . Restrictive lung disease   . Echocardiogram findings abnormal, without diagnosis 10/10    10/10: mild pulm HTN, EF 60-65%, mild LVH, moderate aortic regurg  . Early cataracts, bilateral 10/13    Optho, Dr Gershon Crane  . COPD (chronic obstructive pulmonary disease)     ROS as above otherwise neg.    Medications reviewed. Current Outpatient Prescriptions  Medication Sig Dispense Refill  . albuterol (PROVENTIL HFA;VENTOLIN HFA) 108 (90 BASE) MCG/ACT inhaler Inhale 2 puffs into the lungs every 4 (four) hours as needed for wheezing or shortness of breath.      [name] alendronate (FOSAMAX) 10 MG tablet Take 1 tablet (10 mg total) by mouth daily before breakfast. Take with a full glass of water on an empty stomach.  90 tablet  3  . allopurinol (ZYLOPRIM) 300 MG tablet Take 300 mg by mouth daily with breakfast.      .  amLODipine (NORVASC) 10 MG tablet Take 10 mg by mouth daily with breakfast.      . aspirin 81 MG chewable tablet Chew 81 mg by mouth daily.        [name] atenolol (TENORMIN) 50 MG tablet Take 25 mg by mouth daily with breakfast.       . atorvastatin (LIPITOR) 20 MG tablet Take 20 mg by mouth daily with breakfast.      . colchicine 0.6 MG tablet Take 0.6 mg by mouth daily as needed (gout).      . cyclobenzaprine (FLEXERIL) 5 MG tablet Take 5 mg by mouth daily as needed for muscle spasms.      . enalapril (VASOTEC) 20 MG tablet Take 20 mg by mouth daily with breakfast.      . ergocalciferol (VITAMIN D2) 50000 UNITS capsule Take 1 capsule (50,000 Units total) by mouth once a week.  12 capsule  1  . fish oil-omega-3 fatty acids 1000 MG capsule Take 2 g by mouth once a week.       . fluticasone (FLONASE) 50 MCG/ACT nasal spray Place 1 spray into the nose daily as needed for rhinitis.      [name] glipiZIDE (GLUCOTROL) 10 MG tablet Take 2 tablets (20 mg total) by mouth 2 (two) times daily before a meal.  120 tablet  6  . hydrochlorothiazide (HYDRODIURIL) 25 MG tablet Take 25 mg  by mouth daily with breakfast.      . hydrocortisone cream 1 % Apply 1 application topically as needed (breast tenderness).      . Hyprom-Naphaz-Polysorb-Zn Sulf (CLEAR EYES COMPLETE OP) Place 1 drop into both eyes daily as needed (dry eyes).       [name] loratadine (CLARITIN) 10 MG tablet Take 1 tablet (10 mg total) by mouth daily as needed for allergies.  30 tablet  3  . metFORMIN (GLUCOPHAGE) 1000 MG tablet Take 1 tablet (1,000 mg total) by mouth 2 (two) times daily.  180 tablet  3  . nitroGLYCERIN (NITROSTAT) 0.4 MG SL tablet Place 0.4 mg under the tongue every 5 (five) minutes as needed for chest pain.       [name] tiotropium (SPIRIVA) 18 MCG inhalation capsule Place 18 mcg into inhaler and inhale as needed (if its hot outside).      [name] VITAMIN E PO Take 1 tablet by mouth once a week. Take with the vitamin D       No current  facility-administered medications for this visit.    Exam: BP 147/80  Pulse 73  Temp(Src) 98.2 F (36.8 C) (Oral)  Ht 5' 3"  (1.6 m)  Wt 205 lb (92.987 kg)  BMI 36.32 kg/m2 Gen: Well NAD HEENT: EOMI,  MMM MSK: Near FROM of arms bilat. Abduction limited to 160 degrees. Mild pain on palpation of shoulder capsule and on empty can. Hawkins neg. Difficulty w/ hands behind back.   Results for orders placed in visit on 03/24/13 (from the past 72 hour(s))  POCT GLYCOSYLATED HEMOGLOBIN (HGB A1C)     Status: None   Collection Time    03/24/13 10:33 AM      Result Value Range   Hemoglobin A1C 7.8      A/P (as seen in Problem list)  DIABETES MELLITUS II, UNCOMPLICATED Lab Results  Component Value Date   HGBA1C 7.8 03/24/2013   A1c elevated.  Increase Metformin to 1062m BID Cont glipizide  Pancreatitis, acute Resolved.  May return to regular diet.  Precautions given  Pain in joint, shoulder region Restarting flexeril and exercises No need for injection today Consider nitro patches in future vs another injection  Abdominal pain, right upper quadrant Resolved Liver mass benign F/u per GI

## 2013-03-24 NOTE — Assessment & Plan Note (Signed)
Restarting flexeril and exercises No need for injection today Consider nitro patches in future vs another injection

## 2013-03-24 NOTE — Assessment & Plan Note (Signed)
Lab Results  Component Value Date   HGBA1C 7.8 03/24/2013   A1c elevated.  Increase Metformin to 1046m BID Cont glipizide

## 2013-03-26 ENCOUNTER — Telehealth: Payer: Self-pay | Admitting: Physician Assistant

## 2013-03-26 DIAGNOSIS — K921 Melena: Secondary | ICD-10-CM

## 2013-03-26 NOTE — Telephone Encounter (Signed)
Patient calling to report BRB in stool x 1 today. States the blood was in the toilet. Denies constipation or hemorrhoids or abdominal pain. Spoke with Mike Gip, PA . CBC today and OV on Monday. Lab in [facility]. Spoke with patient and she cannot come for lab today. She understand to go to ED if blood increases or she worsens. Scheduled OV on 03/29/13 at 3:00 PM with Mike Gip, PA

## 2013-03-29 ENCOUNTER — Encounter: Payer: Self-pay | Admitting: Physician Assistant

## 2013-03-29 ENCOUNTER — Other Ambulatory Visit (INDEPENDENT_AMBULATORY_CARE_PROVIDER_SITE_OTHER): Payer: No Typology Code available for payment source

## 2013-03-29 ENCOUNTER — Ambulatory Visit (INDEPENDENT_AMBULATORY_CARE_PROVIDER_SITE_OTHER): Payer: No Typology Code available for payment source | Admitting: Physician Assistant

## 2013-03-29 VITALS — BP 108/60 | HR 68 | Ht 63.0 in | Wt 207.2 lb

## 2013-03-29 DIAGNOSIS — K625 Hemorrhage of anus and rectum: Secondary | ICD-10-CM

## 2013-03-29 LAB — CBC WITH DIFFERENTIAL/PLATELET
Basophils Absolute: 0 10*3/uL (ref 0.0–0.1)
Basophils Relative: 0.4 % (ref 0.0–3.0)
Eosinophils Absolute: 0.1 10*3/uL (ref 0.0–0.7)
Eosinophils Relative: 0.8 % (ref 0.0–5.0)
HCT: 39.1 % (ref 36.0–46.0)
Hemoglobin: 12.9 g/dL (ref 12.0–15.0)
Lymphocytes Relative: 31.6 % (ref 12.0–46.0)
Lymphs Abs: 3.5 10*3/uL (ref 0.7–4.0)
MCHC: 33.1 g/dL (ref 30.0–36.0)
MCV: 85 fl (ref 78.0–100.0)
Monocytes Absolute: 0.8 10*3/uL (ref 0.1–1.0)
Monocytes Relative: 7.5 % (ref 3.0–12.0)
Neutro Abs: 6.7 10*3/uL (ref 1.4–7.7)
Neutrophils Relative %: 59.7 % (ref 43.0–77.0)
Platelets: 214 10*3/uL (ref 150.0–400.0)
RBC: 4.6 Mil/uL (ref 3.87–5.11)
RDW: 13.5 % (ref 11.5–14.6)
WBC: 11.2 10*3/uL — ABNORMAL HIGH (ref 4.5–10.5)

## 2013-03-29 NOTE — Patient Instructions (Addendum)
You will need to go to the basement today for labs You will need to follow up with Amy Esterwood in 4 weeks to discuss scheduling a Colonoscopy We are getting a release signed today so we can get records from Dr Madilyn Fireman

## 2013-03-29 NOTE — Progress Notes (Signed)
Subjective:    Patient ID: Bonnie Peterson, female    DOB: [birth year], 67 y.o.   MRN: [id number]  HPI  Bonnie Peterson is a very nice 67 year old African American female who has just undergone liver biopsy within the past couple of weeks for evaluation of a small hepatic mass. 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 Said no one measures 3.7 x 2.1 x 2.4 cm. She says she did well with the liver biopsy and has been limiting her activities. She comes in today because she noticed a small amount of blood with a bowel movement on Friday, 03/26/2013. She says she's passed just a small amount of red blood in a very tiny clot. She denies any rectal pain or discomfort had not been constipated or straining. She says her bowel movements have been normal she has no complaints of abdominal pain and otherwise she feels fine. She has not seen any further blood since Friday. Patient did have a screening colonoscopy at age 67 done by Dr. Amedeo Plenty of [facility] gastroenterology.. Patient thinks this was a negative exam.    Review of Systems  Constitutional: Positive for fatigue.  HENT: Negative.   Eyes: Negative.   Respiratory: Negative.   Cardiovascular: Negative.   Gastrointestinal: Positive for anal bleeding.  Endocrine: Negative.   Genitourinary: Negative.   Musculoskeletal: Negative.   Allergic/Immunologic: Negative.   Neurological: Negative.   Hematological: Negative.   Psychiatric/Behavioral: Negative.    Outpatient Prescriptions Prior to Visit  Medication Sig Dispense Refill  . albuterol (PROVENTIL HFA;VENTOLIN HFA) 108 (90 BASE) MCG/ACT inhaler Inhale 2 puffs into the lungs every 4 (four) hours as needed for wheezing or shortness of breath.      [name] alendronate (FOSAMAX) 10 MG tablet Take 1 tablet (10 mg total) by mouth daily before breakfast. Take with a full glass of water on an empty stomach.  90 tablet  3  . allopurinol (ZYLOPRIM) 300 MG tablet Take 300 mg by mouth daily with breakfast.      .  amLODipine (NORVASC) 10 MG tablet Take 10 mg by mouth daily with breakfast.      . aspirin 81 MG chewable tablet Chew 81 mg by mouth daily.        [name] atenolol (TENORMIN) 50 MG tablet Take 25 mg by mouth daily with breakfast.       . atorvastatin (LIPITOR) 20 MG tablet Take 20 mg by mouth daily with breakfast.      . colchicine 0.6 MG tablet Take 0.6 mg by mouth daily as needed (gout).      . cyclobenzaprine (FLEXERIL) 5 MG tablet Take 5 mg by mouth daily as needed for muscle spasms.      . enalapril (VASOTEC) 20 MG tablet Take 20 mg by mouth daily with breakfast.      . ergocalciferol (VITAMIN D2) 50000 UNITS capsule Take 1 capsule (50,000 Units total) by mouth once a week.  12 capsule  1  . fish oil-omega-3 fatty acids 1000 MG capsule Take 2 g by mouth once a week.       . fluticasone (FLONASE) 50 MCG/ACT nasal spray Place 1 spray into the nose daily as needed for rhinitis.      [name] glipiZIDE (GLUCOTROL) 10 MG tablet Take 2 tablets (20 mg total) by mouth 2 (two) times daily before a meal.  120 tablet  6  . hydrochlorothiazide (HYDRODIURIL) 25 MG tablet Take 25 mg by mouth daily with breakfast.      .  hydrocortisone cream 1 % Apply 1 application topically as needed (breast tenderness).      . Hyprom-Naphaz-Polysorb-Zn Sulf (CLEAR EYES COMPLETE OP) Place 1 drop into both eyes daily as needed (dry eyes).       [name] loratadine (CLARITIN) 10 MG tablet Take 1 tablet (10 mg total) by mouth daily as needed for allergies.  30 tablet  3  . metFORMIN (GLUCOPHAGE) 1000 MG tablet Take 1 tablet (1,000 mg total) by mouth 2 (two) times daily.  180 tablet  3  . nitroGLYCERIN (NITROSTAT) 0.4 MG SL tablet Place 0.4 mg under the tongue every 5 (five) minutes as needed for chest pain.       [name] tiotropium (SPIRIVA) 18 MCG inhalation capsule Place 18 mcg into inhaler and inhale as needed (if its hot outside).      [name] VITAMIN E PO Take 1 tablet by mouth once a week. Take with the vitamin D       No facility-administered  medications prior to visit.     No Known Allergies Patient Active Problem List   Diagnosis Date Noted  . IBS (irritable bowel syndrome) 03/22/2013  . 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 Pain in joint, shoulder region 11/12/2012  . Osteoporosis, unspecified 10/15/2012  . Routine health maintenance 09/23/2012  . Vitamin D deficiency 12/23/2011  . Tobacco abuse 10/12/2010  . COPD (chronic obstructive pulmonary disease) 06/14/2010  . OBSTRUCTIVE SLEEP APNEA 04/27/2009  . OBESITY, UNSPECIFIED 04/26/2009  . RESTRICTIVE LUNG DISEASE 11/11/2008  . DYSLIPIDEMIA 11/25/2007  . GOUT NOS 12/24/2006  . DIABETES MELLITUS II, UNCOMPLICATED 83/12/4074  . DEPRESSION, MAJOR, RECURRENT 06/05/2006  . ANXIETY 06/05/2006  . HYPERTENSION, BENIGN SYSTEMIC 06/05/2006  . IRRITABLE BOWEL SYNDROME 06/05/2006  . OSTEOARTHRITIS, MULTI SITES 06/05/2006   History  Substance Use Topics  . Smoking status: Current Every Day Smoker -- 0.50 packs/day for 40 years    Types: Cigarettes  . Smokeless tobacco: Never Used  . Alcohol Use: Yes     Comment: occasionally   family history includes Breast cancer in her mother; Coronary artery disease in her mother; Diabetes type II in her mother and sister; Hypertension in her mother; Pancreatic cancer in her sister. Objective:   Physical Exam     Well-developed older African American female in no acute distress, quite pleasant. Blood pressure 108/60 pulse 68 height 5 foot 3 weight 207. HEENT; nontraumatic normocephalic EOMI PERRLA sclera anicteric, Supple; no JVD, Cardiovascular; regular rate and rhythm with S1-S2 no murmur or gallop, Pulmonary ;clear bilaterally, Abdomen; obese soft nontender very minimally tender in the left lower quadrant there is no guarding or rebound no palpable mass or hepatosplenomegaly, Rectal; exam no external hemorrhoids noted on anoscopy no internal hemorrhoids or other lesions noted no blood in the rectal vault  and no stool for Hemoccult. Extremities ;no clubbing cyanosis or edema skin warm and dry, Psych; mood and affect normal and appropriate         Assessment & Plan:  #25  67 year old female with isolated episode of small-volume hematochezia. No hemorrhoids internal or external noted on exam today and no stool in rectal vault for Hemoccult. Etiology of the bleeding is unclear she could have had a very shallow fissure. Also cannot rule out occult colon lesion as last colonoscopy was at least 10 years ago #2 status post liver biopsy December 2014 for a hepatic mass. 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 #3 diabetes mellitus #4 obesity #5 COPD #7  sleep apnea #8 hypertension  Plan; Check CBC with differential today Patient is advised that she needs a colonoscopy, she does not want to schedule this currently. She would like to wait until after the first of the year to schedule for followup. In the interim she is to monitor for any recurrent bleeding and call should she have any recurrent bleeding. She is strongly encouraged to keep followup in January so we can schedule colonoscopy.  Patient is asked to sign a release so we can obtain her records from [facility]  With regards to the hepatic had adenoma-will plan for followup MRI in about 6 months to assure stability of the size of the adenoma

## 2013-03-31 NOTE — Progress Notes (Signed)
i agree with the plan in this note

## 2013-04-27 ENCOUNTER — Encounter: Payer: Self-pay | Admitting: Physician Assistant

## 2013-04-27 ENCOUNTER — Ambulatory Visit (INDEPENDENT_AMBULATORY_CARE_PROVIDER_SITE_OTHER): Payer: Medicare HMO | Admitting: Physician Assistant

## 2013-04-27 VITALS — BP 128/70 | HR 80 | Ht 63.0 in | Wt 207.2 lb

## 2013-04-27 DIAGNOSIS — Z86010 Personal history of colonic polyps: Secondary | ICD-10-CM

## 2013-04-27 DIAGNOSIS — K625 Hemorrhage of anus and rectum: Secondary | ICD-10-CM

## 2013-04-27 DIAGNOSIS — D135 Benign neoplasm of extrahepatic bile ducts: Secondary | ICD-10-CM

## 2013-04-27 DIAGNOSIS — D134 Benign neoplasm of liver: Secondary | ICD-10-CM

## 2013-04-27 MED ORDER — MOVIPREP 100 G PO SOLR: 1.0000 | ORAL | Status: DC

## 2013-04-27 NOTE — Patient Instructions (Signed)
You have been scheduled for a colonoscopy with propofol. Please follow written instructions given to you at your visit today.  Please pick up your prep kit at the pharmacy within the next 1-3 days. [place].  If you use inhalers (even only as needed), please bring them with you on the day of your procedure. [name]

## 2013-04-27 NOTE — Progress Notes (Signed)
i agree with the plan outlined in this note.

## 2013-04-27 NOTE — Progress Notes (Signed)
Subjective:    Patient ID: Bonnie Peterson, female    DOB: [birth year], 68 y.o.   MRN: [id number]  HPI  Bonnie Peterson is a very nice 68 year old African female who had undergone evaluation for a liver mass in fall of 2014. She underwent liver biopsy which proved this to be a hepatic adenoma measuring 3.7 x 2.1 x 2.4 cm. Plan is for 6 month interval followup for stability Patient was seen in 2013-03-26 after she had passed some red blood and a blood clot with a bowel movement. Anoscopy at that time no internal hemorrhoids noted and no external lesions noted. There was no stool for Hemoccult. She had one prior colonoscopy which had been done by Dr. Amedeo Plenty. We have since obtaining that report and this showed colonoscopy done in January 2006 -with left-sided diverticulosis and a sigmoid lipoma which was not biopsied. She did have random biopsies done because of complaints of diarrhea at that time and these were negative. Patient comes in today to discuss followup colonoscopy. She had wanted to wait until after the first of the year to pursue this. She says she's been feeling good recently and has not noticed any further bleeding.     Review of Systems  Constitutional: Negative.   HENT: Negative.   Eyes: Negative.   Respiratory: Negative.   Cardiovascular: Negative.   Gastrointestinal: Negative.   Endocrine: Negative.   Genitourinary: Negative.   Musculoskeletal: Negative.   Allergic/Immunologic: Negative.   Neurological: Negative.   Hematological: Negative.   Psychiatric/Behavioral: Negative.    Outpatient Prescriptions Prior to Visit  Medication Sig Dispense Refill  . albuterol (PROVENTIL HFA;VENTOLIN HFA) 108 (90 BASE) MCG/ACT inhaler Inhale 2 puffs into the lungs every 4 (four) hours as needed for wheezing or shortness of breath.      [name] alendronate (FOSAMAX) 10 MG tablet Take 1 tablet (10 mg total) by mouth daily before breakfast. Take with a full glass of water on an empty stomach.  90  tablet  3  . allopurinol (ZYLOPRIM) 300 MG tablet Take 300 mg by mouth daily with breakfast.      . amLODipine (NORVASC) 10 MG tablet Take 10 mg by mouth daily with breakfast.      . aspirin 81 MG chewable tablet Chew 81 mg by mouth daily.        [name] atenolol (TENORMIN) 50 MG tablet Take 25 mg by mouth daily with breakfast.       . atorvastatin (LIPITOR) 20 MG tablet Take 20 mg by mouth daily with breakfast.      . colchicine 0.6 MG tablet Take 0.6 mg by mouth daily as needed (gout).      . cyclobenzaprine (FLEXERIL) 5 MG tablet Take 5 mg by mouth daily as needed for muscle spasms.      . enalapril (VASOTEC) 20 MG tablet Take 20 mg by mouth daily with breakfast.      . ergocalciferol (VITAMIN D2) 50000 UNITS capsule Take 1 capsule (50,000 Units total) by mouth once a week.  12 capsule  1  . fish oil-omega-3 fatty acids 1000 MG capsule Take 2 g by mouth once a week.       . fluticasone (FLONASE) 50 MCG/ACT nasal spray Place 1 spray into the nose daily as needed for rhinitis.      [name] glipiZIDE (GLUCOTROL) 10 MG tablet Take 2 tablets (20 mg total) by mouth 2 (two) times daily before a meal.  120 tablet  6  . hydrochlorothiazide (  HYDRODIURIL) 25 MG tablet Take 25 mg by mouth daily with breakfast.      . hydrocortisone cream 1 % Apply 1 application topically as needed (breast tenderness).      . Hyprom-Naphaz-Polysorb-Zn Sulf (CLEAR EYES COMPLETE OP) Place 1 drop into both eyes daily as needed (dry eyes).       [name] loratadine (CLARITIN) 10 MG tablet Take 1 tablet (10 mg total) by mouth daily as needed for allergies.  30 tablet  3  . metFORMIN (GLUCOPHAGE) 1000 MG tablet Take 1 tablet (1,000 mg total) by mouth 2 (two) times daily.  180 tablet  3  . nitroGLYCERIN (NITROSTAT) 0.4 MG SL tablet Place 0.4 mg under the tongue every 5 (five) minutes as needed for chest pain.       [name] tiotropium (SPIRIVA) 18 MCG inhalation capsule Place 18 mcg into inhaler and inhale as needed (if its hot outside).      [name] VITAMIN E  PO Take 1 tablet by mouth once a week. Take with the vitamin D       No facility-administered medications prior to visit.   No Known Allergies Patient Active Problem List   Diagnosis Date Noted  . 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 IBS (irritable bowel syndrome) 03/22/2013  . Pain in joint, shoulder region 11/12/2012  . Osteoporosis, unspecified 10/15/2012  . Routine health maintenance 09/23/2012  . Vitamin D deficiency 12/23/2011  . Tobacco abuse 10/12/2010  . COPD (chronic obstructive pulmonary disease) 06/14/2010  . OBSTRUCTIVE SLEEP APNEA 04/27/2009  . OBESITY, UNSPECIFIED 04/26/2009  . RESTRICTIVE LUNG DISEASE 11/11/2008  . DYSLIPIDEMIA 11/25/2007  . GOUT NOS 12/24/2006  . DIABETES MELLITUS II, UNCOMPLICATED 86/76/1950  . DEPRESSION, MAJOR, RECURRENT 06/05/2006  . ANXIETY 06/05/2006  . HYPERTENSION, BENIGN SYSTEMIC 06/05/2006  . IRRITABLE BOWEL SYNDROME 06/05/2006  . OSTEOARTHRITIS, MULTI SITES 06/05/2006   History  Substance Use Topics  . Smoking status: Current Every Day Smoker -- 0.50 packs/day for 40 years    Types: Cigarettes  . Smokeless tobacco: Never Used  . Alcohol Use: Yes     Comment: occasionally      family history includes Breast cancer in her mother; Coronary artery disease in her mother; Diabetes type II in her mother and sister; Hypertension in her mother; Pancreatic cancer in her sister.  Objective:   Physical Exam  and and and well-developed older Afro-American female in no acute distress, pleasant blood pressure 120/70 pulse 80 height 5 foot 3 weight 207 BMI is 36.7. HEENT; nontraumatic normocephalic EOMI PERRLA sclera anicteric, Supple ;no JVD, Cardiovascular; regular rate and rhythm with S1-S2 no murmur or gallop, Pulmonary ;clear bilaterally, Abdomen; large soft nontender nondistended bowel sounds active no palpable mass or hepatosplenomegaly, Rectal; exam not done, EXt; no clubbing cyanosis or edema skin warm and dry, Psych ;mood and affect  appropriate       Assessment & Plan:  #79  68 year old female with isolated episode of rectal bleeding with passage of bright red blood and clot, etiology not clear. Last colonoscopy was done 9 years ago showing diverticulosis and a sigmoid a lipoma. Repeat colonoscopy indicated at this time to rule out occult lesion  #2 hepatic adenoma, biopsy-proven December 2014-plan for six-month followup imaging to assure stability (MRI) #3 obesity #4 sleep apnea #5 hypertension #6 diabetes mellitus #7 COPD  Plan; schedule colonoscopy with Dr. Lyndee Leo discussed in detail with the patient and she is agreeable to proceed. Plan followup MRI of liver in June 2015.

## 2013-05-24 ENCOUNTER — Telehealth: Payer: Self-pay | Admitting: Gastroenterology

## 2013-05-24 ENCOUNTER — Encounter: Payer: Medicare HMO | Admitting: Gastroenterology

## 2013-05-24 NOTE — Telephone Encounter (Signed)
Pt resch'd her colon from today until 06-23-13 due to the weather.  Pt said that she has already drunk the 1st part of her prep last night but afraid the weather will get bad after lunch.  Would you like pt charged the cancelation fee? Also, would like a nurse to CB to give new prep instruction and will need another prep

## 2013-05-24 NOTE — Telephone Encounter (Signed)
ok 

## 2013-05-24 NOTE — Telephone Encounter (Signed)
Pt has been mailed voucher for movi prep and instructions for new date and time  Pt is aware

## 2013-05-31 ENCOUNTER — Telehealth: Payer: Self-pay | Admitting: Family Medicine

## 2013-05-31 DIAGNOSIS — M1990 Unspecified osteoarthritis, unspecified site: Secondary | ICD-10-CM

## 2013-05-31 MED ORDER — VITAMIN D3 25 MCG (1000 UNIT) PO TABS: 1000.0000 [IU] | ORAL_TABLET | Freq: Every day | ORAL | Status: DC

## 2013-05-31 MED ORDER — CYCLOBENZAPRINE HCL 5 MG PO TABS: 5.0000 mg | ORAL_TABLET | Freq: Every day | ORAL | Status: DC | PRN

## 2013-05-31 MED ORDER — COLCHICINE 0.6 MG PO TABS: 0.6000 mg | ORAL_TABLET | Freq: Every day | ORAL | Status: DC | PRN

## 2013-05-31 NOTE — Telephone Encounter (Signed)
Would like to talk about her medications hasnt been updated at [facility] Dr Marily Memos said he was going to put in for a nursing asst and she hasnt heard anything Please advise

## 2013-05-31 NOTE — Telephone Encounter (Signed)
Spoke to pt concerning overall condition.  [place] struggling to perform ADLs Referral for [facility] placed Other medications refilled.   Linna Darner, MD Family Medicine PGY-3 05/31/2013, 12:21 PM

## 2013-05-31 NOTE — Assessment & Plan Note (Signed)
Multiple arthritic joints decreasing mobility and ADLs Home Health aid referral placed

## 2013-06-02 NOTE — Addendum Note (Signed)
Addended by: Marily Memos, DAVID J on: 06/02/2013 05:10 PM   Modules accepted: Orders

## 2013-06-08 ENCOUNTER — Telehealth: Payer: Self-pay | Admitting: Family Medicine

## 2013-06-08 NOTE — Telephone Encounter (Signed)
This is already done and faxed

## 2013-06-08 NOTE — Telephone Encounter (Signed)
Still waiting for him to sign off on nursing assistant request. A paper was faxed yesterday for him to sign off to have meds filled at mail order place. This is from [place] . Please advise

## 2013-06-09 ENCOUNTER — Telehealth: Payer: Self-pay | Admitting: Gastroenterology

## 2013-06-09 DIAGNOSIS — K625 Hemorrhage of anus and rectum: Secondary | ICD-10-CM

## 2013-06-09 DIAGNOSIS — Z86010 Personal history of colonic polyps: Secondary | ICD-10-CM

## 2013-06-09 MED ORDER — MOVIPREP 100 G PO SOLR: 1.0000 | ORAL | Status: DC

## 2013-06-09 NOTE — Telephone Encounter (Signed)
Prep has been resent

## 2013-06-14 ENCOUNTER — Encounter: Payer: Self-pay | Admitting: Family Medicine

## 2013-06-14 ENCOUNTER — Other Ambulatory Visit: Payer: Self-pay | Admitting: Family Medicine

## 2013-06-14 MED ORDER — ATENOLOL 50 MG PO TABS: 25.0000 mg | ORAL_TABLET | Freq: Every day | ORAL | Status: DC

## 2013-06-14 MED ORDER — ENALAPRIL MALEATE 20 MG PO TABS: 20.0000 mg | ORAL_TABLET | Freq: Every day | ORAL | Status: DC

## 2013-06-14 MED ORDER — ACCU-CHEK AVIVA VI SOLN: Status: DC

## 2013-06-14 MED ORDER — GLIPIZIDE 10 MG PO TABS: 20.0000 mg | ORAL_TABLET | Freq: Two times a day (BID) | ORAL | Status: DC

## 2013-06-14 MED ORDER — CYCLOBENZAPRINE HCL 5 MG PO TABS: 5.0000 mg | ORAL_TABLET | Freq: Every day | ORAL | Status: DC | PRN

## 2013-06-14 MED ORDER — HYDROCHLOROTHIAZIDE 25 MG PO TABS: 25.0000 mg | ORAL_TABLET | Freq: Every day | ORAL | Status: DC

## 2013-06-14 MED ORDER — GLUCOSE BLOOD VI STRP: ORAL_STRIP | Status: DC

## 2013-06-14 MED ORDER — ALLOPURINOL 300 MG PO TABS: 300.0000 mg | ORAL_TABLET | Freq: Every day | ORAL | Status: DC

## 2013-06-14 MED ORDER — ACCU-CHEK SOFT TOUCH LANCETS MISC: Status: DC

## 2013-06-14 MED ORDER — ACCU-CHEK AVIVA PLUS W/DEVICE KIT: PACK | Status: DC

## 2013-06-14 MED ORDER — COLCHICINE 0.6 MG PO TABS: 0.6000 mg | ORAL_TABLET | Freq: Every day | ORAL | Status: DC | PRN

## 2013-06-14 MED ORDER — ATORVASTATIN CALCIUM 40 MG PO TABS: 40.0000 mg | ORAL_TABLET | Freq: Every day | ORAL | Status: DC

## 2013-06-14 MED ORDER — METFORMIN HCL 1000 MG PO TABS: 1000.0000 mg | ORAL_TABLET | Freq: Two times a day (BID) | ORAL | Status: DC

## 2013-06-14 MED ORDER — AMLODIPINE BESYLATE 10 MG PO TABS: 10.0000 mg | ORAL_TABLET | Freq: Every day | ORAL | Status: DC

## 2013-06-14 NOTE — Progress Notes (Signed)
Patient ID: Bonnie Peterson, female   DOB: 15-Jul-1945, 68 y.o.   MRN: [id number]    Paperwork again received for [facility] aid and filled out and faxedto [facility] connections (attn Dianne Dun)

## 2013-06-14 NOTE — Progress Notes (Signed)
Prescribed pts entire medication panel to new [facility]

## 2013-06-18 ENCOUNTER — Telehealth: Payer: Self-pay | Admitting: *Deleted

## 2013-06-18 NOTE — Telephone Encounter (Signed)
Prior Authorization received from [facility] for Cyclobenzaprine HCL 5 mg tab. Formulary and PA form placed in provider box for completion. Derl Barrow, RN

## 2013-06-23 ENCOUNTER — Ambulatory Visit: Payer: Medicare HMO | Admitting: Gastroenterology

## 2013-06-23 ENCOUNTER — Telehealth: Payer: Self-pay | Admitting: Gastroenterology

## 2013-06-23 DIAGNOSIS — K625 Hemorrhage of anus and rectum: Secondary | ICD-10-CM

## 2013-06-23 DIAGNOSIS — Z86010 Personal history of colonic polyps: Secondary | ICD-10-CM

## 2013-06-23 DIAGNOSIS — R933 Abnormal findings on diagnostic imaging of other parts of digestive tract: Secondary | ICD-10-CM

## 2013-06-23 LAB — GLUCOSE, CAPILLARY: Glucose-Capillary: 190 mg/dL — ABNORMAL HIGH (ref 70–99)

## 2013-06-23 MED ORDER — MOVIPREP 100 G PO SOLR: 1.0000 | ORAL | Status: DC

## 2013-06-23 NOTE — Telephone Encounter (Signed)
No need to charge

## 2013-06-23 NOTE — Progress Notes (Signed)
Pt. Procedure canceled, pt. Ate this morning at 10 egg and toast. Doctor in to speak to pt.

## 2013-06-29 ENCOUNTER — Other Ambulatory Visit: Payer: Self-pay | Admitting: Family Medicine

## 2013-06-29 MED ORDER — BACLOFEN 10 MG PO TABS: 10.0000 mg | ORAL_TABLET | Freq: Three times a day (TID) | ORAL | Status: DC | PRN

## 2013-06-29 NOTE — Progress Notes (Signed)
Changed from flexeril to baclofen per [place]

## 2013-07-02 ENCOUNTER — Encounter: Payer: Self-pay | Admitting: Gastroenterology

## 2013-07-02 ENCOUNTER — Ambulatory Visit (AMBULATORY_SURGERY_CENTER): Payer: Medicare HMO | Admitting: Gastroenterology

## 2013-07-02 VITALS — BP 149/72 | HR 74 | Temp 97.6°F | Resp 15 | Ht 63.0 in | Wt 207.0 lb

## 2013-07-02 DIAGNOSIS — Z86010 Personal history of colonic polyps: Secondary | ICD-10-CM

## 2013-07-02 DIAGNOSIS — D126 Benign neoplasm of colon, unspecified: Secondary | ICD-10-CM

## 2013-07-02 DIAGNOSIS — D175 Benign lipomatous neoplasm of intra-abdominal organs: Secondary | ICD-10-CM

## 2013-07-02 DIAGNOSIS — R933 Abnormal findings on diagnostic imaging of other parts of digestive tract: Secondary | ICD-10-CM

## 2013-07-02 DIAGNOSIS — Z1211 Encounter for screening for malignant neoplasm of colon: Secondary | ICD-10-CM

## 2013-07-02 LAB — GLUCOSE, CAPILLARY
Glucose-Capillary: 112 mg/dL — ABNORMAL HIGH (ref 70–99)
Glucose-Capillary: 126 mg/dL — ABNORMAL HIGH (ref 70–99)

## 2013-07-02 MED ORDER — SODIUM CHLORIDE 0.9 % IV SOLN: 500.0000 mL | INTRAVENOUS | Status: DC

## 2013-07-02 NOTE — Op Note (Signed)
[facility]  [address] [place], [address]   COLONOSCOPY PROCEDURE REPORT  PATIENT: [name], [name]  MR#: [id number] BIRTHDATE: 07/24/1945 , 67  yrs. old GENDER: Female ENDOSCOPIST: [name], MD REFERRED EH:[id number] Merrell, M.D. PROCEDURE DATE:  07/02/2013 PROCEDURE:   Colonoscopy with snare polypectomy, Colonoscopy with biopsy, and Submucosal injection, any substance First Screening Colonoscopy - Avg.  risk and is 50 yrs.  old or older - No.  Prior Negative Screening - Now for repeat screening. N/A  History of Adenoma - Now for follow-up colonoscopy & has been > or = to 3 yrs.  N/A  Polyps Removed Today? Yes. ASA CLASS:   Class II INDICATIONS:recent minor rectal bleeding.  Colonsocopy 2006 Dr. Amedeo Plenty, no polyps, + 2cm lipoma in sigmoid noted, not biopsied. MEDICATIONS: MAC sedation, administered by CRNA and Propofol (Diprivan) 240 mg IV  DESCRIPTION OF PROCEDURE:   After the risks benefits and alternatives of the procedure were thoroughly explained, informed consent was obtained.  A digital rectal exam revealed no abnormalities of the rectum.   The LB [id number]  endoscope was introduced through the anus and advanced to the cecum, which was identified by both the appendix and ileocecal valve. No adverse events experienced.   The quality of the prep was excellent.  The instrument was then slowly withdrawn as the colon was fully examined.   COLON FINDINGS: Two polyps were found, removed and sent to pathology.  These were both sessile, located in transverse segment, 28m across, removed with cold snare, sent to pathology (jar 1). There were 3-4 small lipomas in right and left colon (6-850macross).  There was one large, pedunculated lipoma, 3cm across, indurated and inflamed at the tip.  The associated stalk was 1.5cm across (very thick).  Biopsies were taken from the tip of the lipomatous appearing lesion to confirm it is not adenomatous.   The site was labeled with injection of InNigernk to aid in potential future localization.  Retroflexed views revealed no abnormalities. The time to cecum=2 minutes 46 seconds.  Withdrawal time=13 minutes 48 seconds.  The scope was withdrawn and the procedure completed. COMPLICATIONS: There were no complications.  ENDOSCOPIC IMPRESSION: See above  RECOMMENDATIONS: Await final pathology.  Will consider surgical referral for elective resection of what is likely a large, pedunculated lipoma in sigmoid segment that may have caused your recent rectal bleeding.  Polyp surveillance colonoscopy per final pathology report as well.   eSigned:  DaMilus BanisterMD 07/02/2013 2:28 PM     PATIENT NAME:  Bonnie Peterson, ValinR#: [id number]

## 2013-07-02 NOTE — Patient Instructions (Signed)
YOU HAD AN ENDOSCOPIC PROCEDURE TODAY AT THE [facility]: Refer to the procedure report that was given to you for any specific questions about what was found during the examination.  If the procedure report does not answer your questions, please call your gastroenterologist to clarify.  If you requested that your care partner not be given the details of your procedure findings, then the procedure report has been included in a sealed envelope for you to review at your convenience later.  YOU SHOULD EXPECT: Some feelings of bloating in the abdomen. Passage of more gas than usual.  Walking can help get rid of the air that was put into your GI tract during the procedure and reduce the bloating. If you had a lower endoscopy (such as a colonoscopy or flexible sigmoidoscopy) you may notice spotting of blood in your stool or on the toilet paper. If you underwent a bowel prep for your procedure, then you may not have a normal bowel movement for a few days.  DIET: Your first meal following the procedure should be a light meal and then it is ok to progress to your normal diet.  A half-sandwich or bowl of soup is an example of a good first meal.  Heavy or fried foods are harder to digest and may make you feel nauseous or bloated.  Likewise meals heavy in dairy and vegetables can cause extra gas to form and this can also increase the bloating.  Drink plenty of fluids but you should avoid alcoholic beverages for 24 hours.  ACTIVITY: Your care partner should take you home directly after the procedure.  You should plan to take it easy, moving slowly for the rest of the day.  You can resume normal activity the day after the procedure however you should NOT DRIVE or use heavy machinery for 24 hours (because of the sedation medicines used during the test).    SYMPTOMS TO REPORT IMMEDIATELY: A gastroenterologist can be reached at any hour.  During normal business hours, 8:30 AM to 5:00 PM Monday through Friday,  call [phone].  After hours and on weekends, please call the GI answering service at [phone] who will take a message and have the physician on call contact you.   Following lower endoscopy (colonoscopy or flexible sigmoidoscopy):  Excessive amounts of blood in the stool  Significant tenderness or worsening of abdominal pains  Swelling of the abdomen that is new, acute  Fever of 100F or higher    FOLLOW UP: If any biopsies were taken you will be contacted by phone or by letter within the next 1-3 weeks.  Call your gastroenterologist if you have not heard about the biopsies in 3 weeks.  Our staff will call the home number listed on your records the next business day following your procedure to check on you and address any questions or concerns that you may have at that time regarding the information given to you following your procedure. This is a courtesy call and so if there is no answer at the home number and we have not heard from you through the emergency physician on call, we will assume that you have returned to your regular daily activities without incident.  SIGNATURES/CONFIDENTIALITY: You and/or your care partner have signed paperwork which will be entered into your electronic medical record.  These signatures attest to the fact that that the information above on your After Visit Summary has been reviewed and is understood.  Full responsibility of the confidentiality   of this discharge information lies with you and/or your care-partner.    Information on polyps given to you today   Await biopsy results

## 2013-07-02 NOTE — Progress Notes (Signed)
Called to room to assist during endoscopic procedure.  Patient ID and intended procedure confirmed with present staff. Received instructions for my participation in the procedure from the performing physician.  

## 2013-07-02 NOTE — Progress Notes (Signed)
Procedure ends, to recovery, report given and VSS. 

## 2013-07-05 ENCOUNTER — Telehealth: Payer: Self-pay | Admitting: *Deleted

## 2013-07-05 NOTE — Telephone Encounter (Signed)
  Follow up Call-  Call back number 07/02/2013  Post procedure Call Back phone  # [phone]  Permission to leave phone message Yes     Patient questions:  Do you have a fever, pain , or abdominal swelling? no Pain Score  0 *  Have you tolerated food without any problems? yes  Have you been able to return to your normal activities? yes  Do you have any questions about your discharge instructions: Diet   no Medications  no Follow up visit  no  Do you have questions or concerns about your Care? no  Actions: * If pain score is 4 or above: No action needed, pain <4.

## 2013-07-07 ENCOUNTER — Encounter: Payer: Self-pay | Admitting: Gastroenterology

## 2013-07-14 NOTE — Telephone Encounter (Signed)
Medication changed to baclofen on  06/29/2013.  Derl Barrow, RN

## 2013-07-22 ENCOUNTER — Encounter: Payer: Self-pay | Admitting: Family Medicine

## 2013-07-22 ENCOUNTER — Ambulatory Visit (INDEPENDENT_AMBULATORY_CARE_PROVIDER_SITE_OTHER): Payer: Medicare HMO | Admitting: Family Medicine

## 2013-07-22 VITALS — BP 134/72 | HR 69 | Temp 97.7°F | Ht 63.0 in | Wt 204.0 lb

## 2013-07-22 DIAGNOSIS — M109 Gout, unspecified: Secondary | ICD-10-CM

## 2013-07-22 DIAGNOSIS — I10 Essential (primary) hypertension: Secondary | ICD-10-CM

## 2013-07-22 DIAGNOSIS — E119 Type 2 diabetes mellitus without complications: Secondary | ICD-10-CM

## 2013-07-22 DIAGNOSIS — Z720 Tobacco use: Secondary | ICD-10-CM

## 2013-07-22 DIAGNOSIS — M810 Age-related osteoporosis without current pathological fracture: Secondary | ICD-10-CM

## 2013-07-22 DIAGNOSIS — F17200 Nicotine dependence, unspecified, uncomplicated: Secondary | ICD-10-CM

## 2013-07-22 LAB — POCT GLYCOSYLATED HEMOGLOBIN (HGB A1C): Hemoglobin A1C: 7.6

## 2013-07-22 MED ORDER — VITAMIN D3 25 MCG (1000 UNIT) PO TABS: 1000.0000 [IU] | ORAL_TABLET | Freq: Every day | ORAL | Status: DC

## 2013-07-22 MED ORDER — ALENDRONATE SODIUM 70 MG PO TABS: 70.0000 mg | ORAL_TABLET | ORAL | Status: DC

## 2013-07-22 MED ORDER — NICOTINE 7 MG/24HR TD PT24: 7.0000 mg | MEDICATED_PATCH | Freq: Every day | TRANSDERMAL | Status: DC

## 2013-07-22 MED ORDER — BUPROPION HCL ER (SR) 150 MG PO TB12: ORAL_TABLET | ORAL | Status: DC

## 2013-07-22 NOTE — Assessment & Plan Note (Signed)
DC allopurinol as infrequent flares Cont Colchycine for prn flare cosider restarting allopurinol if flares come on frequently

## 2013-07-22 NOTE — Patient Instructions (Addendum)
You are in great shape Your diabetes is well controlled, continue to try to exercise, and eat healthy Please start the nicotine 94m patch along with the Wellbutrin (if the wellbutrin makes you feel off or depressed then stop) Please come back to see me in 4 weeks Please only take the fosamax once a week Please stop the allopurinol

## 2013-07-22 NOTE — Assessment & Plan Note (Signed)
Pt very motivated to quit.  Chantix not an option Start welbutrin adn nicotine 8m patch F/u in 2-4 wks

## 2013-07-22 NOTE — Assessment & Plan Note (Signed)
Due to GI intolerance will change Fosomax to once weekly (18m) Continue Vit D. Discuss discontinuation in 2019-2024.

## 2013-07-22 NOTE — Assessment & Plan Note (Signed)
Cont glipizide adn metformin A1c w/ slight improvement. At goal (below 8) Pt to try diet and exercise  Consider adding additional oral such as Januvia if increases A1c.

## 2013-07-22 NOTE — Assessment & Plan Note (Signed)
At goal no change

## 2013-07-22 NOTE — Progress Notes (Signed)
Bonnie Peterson is a 68 y.o. female who presents to [facility] today for f/u  HTN: denies CP, SOB, syncope, palpitations.   DM: taking glipizide 10 bid and metformin 1000 BID. CBTG typically around mid 100s.   Tobacco: continues to smoke 1/2ppd. Tried chantix and had palpitations and went to hostpial  Osteoporosis: Fosamax bothers stomach so does not take on empty stomach. Taking Vit D.   Gout: last flare in NOvember. Only taking allopurinol   Reviewed colonoscopy reports and path reports w/ Ms. Sylva. F/u appt w/ GI in June. F/u colonoscopy in 5 yrs.   The following portions of the patient's history were reviewed and updated as appropriate: allergies, current medications, past medical history, family and social history, and problem list.  Patient is a smoker  Past Medical History  Diagnosis Date  . Obstructive sleep apnea   . HTN, goal below 130/80   . HLD (hyperlipidemia)   . Benign positional vertigo   . Depression   . Anxiety   . Osteoarthritis (arthritis due to wear and tear of joints)   . Irritable bowel syndrome   . Obesity, Class III, BMI 40-49.9 (morbid obesity)   . Gout   . Restrictive lung disease   . Echocardiogram findings abnormal, without diagnosis 10/10    10/10: mild pulm HTN, EF 60-65%, mild LVH, moderate aortic regurg  . Early cataracts, bilateral 10/13    Optho, Dr Gershon Crane  . COPD (chronic obstructive pulmonary disease)     ROS as above otherwise neg.    Medications reviewed. Current Outpatient Prescriptions  Medication Sig Dispense Refill  . albuterol (PROVENTIL HFA;VENTOLIN HFA) 108 (90 BASE) MCG/ACT inhaler Inhale 2 puffs into the lungs every 4 (four) hours as needed for wheezing or shortness of breath.      [name] alendronate (FOSAMAX) 70 MG tablet Take 1 tablet (70 mg total) by mouth every 7 (seven) days. Take with a full glass of water on an empty stomach.  4 tablet  11  . amLODipine (NORVASC) 10 MG tablet Take 1 tablet (10 mg total) by mouth daily with  breakfast.  90 tablet  3  . aspirin 81 MG chewable tablet Chew 81 mg by mouth daily.        [name] atenolol (TENORMIN) 50 MG tablet Take 0.5 tablets (25 mg total) by mouth daily with breakfast.  90 tablet  3  . atorvastatin (LIPITOR) 40 MG tablet Take 1 tablet (40 mg total) by mouth daily with breakfast.  90 tablet  3  . baclofen (LIORESAL) 10 MG tablet Take 1 tablet (10 mg total) by mouth 3 (three) times daily as needed for muscle spasms.  90 each  11  . Blood Glucose Calibration (ACCU-CHEK AVIVA) SOLN Use as directed  1 each  3  . Blood Glucose Monitoring Suppl (ACCU-CHEK AVIVA PLUS) W/DEVICE KIT Use as directed  1 kit  1  . buPROPion (WELLBUTRIN SR) 150 MG 12 hr tablet 150 mg once daily for 3 days; increase to 150 mg twice daily  60 tablet  6  . cholecalciferol (VITAMIN D) 1000 UNITS tablet Take 1 tablet (1,000 Units total) by mouth daily.  30 tablet  11  . colchicine 0.6 MG tablet Take 1 tablet (0.6 mg total) by mouth daily as needed (gout flare).  30 tablet  1  . enalapril (VASOTEC) 20 MG tablet Take 1 tablet (20 mg total) by mouth daily with breakfast.  90 tablet  3  . fish oil-omega-3 fatty acids 1000 MG  capsule Take 2 g by mouth once a week.       . fluticasone (FLONASE) 50 MCG/ACT nasal spray Place 1 spray into the nose daily as needed for rhinitis.      [name] glipiZIDE (GLUCOTROL) 10 MG tablet Take 2 tablets (20 mg total) by mouth 2 (two) times daily before a meal.  180 tablet  3  . glucose blood test strip Use as instructed  100 each  12  . hydrochlorothiazide (HYDRODIURIL) 25 MG tablet Take 1 tablet (25 mg total) by mouth daily with breakfast.  90 tablet  3  . hydrocortisone cream 1 % Apply 1 application topically as needed (breast tenderness).      . Hyprom-Naphaz-Polysorb-Zn Sulf (CLEAR EYES COMPLETE OP) Place 1 drop into both eyes daily as needed (dry eyes).       . Lancets (ACCU-CHEK SOFT TOUCH) lancets Use as instructed  100 each  12  . loratadine (CLARITIN) 10 MG tablet Take 1 tablet (10  mg total) by mouth daily as needed for allergies.  30 tablet  3  . metFORMIN (GLUCOPHAGE) 1000 MG tablet Take 1 tablet (1,000 mg total) by mouth 2 (two) times daily.  180 tablet  3  . nicotine (NICODERM CQ - DOSED IN MG/24 HR) 7 mg/24hr patch Place 1 patch (7 mg total) onto the skin daily.  28 patch  3  . nitroGLYCERIN (NITROSTAT) 0.4 MG SL tablet Place 0.4 mg under the tongue every 5 (five) minutes as needed for chest pain.       [name] tiotropium (SPIRIVA) 18 MCG inhalation capsule Place 18 mcg into inhaler and inhale as needed (if its hot outside).      [name] VITAMIN E PO Take 1 tablet by mouth once a week. Take with the vitamin D       No current facility-administered medications for this visit.    Exam: BP 134/72  Pulse 69  Temp(Src) 97.7 F (36.5 C) (Oral)  Ht 5' 3"  (1.6 m)  Wt 204 lb (92.534 kg)  BMI 36.15 kg/m2 Gen: Well NAD   Results for orders placed in visit on 07/22/13 (from the past 72 hour(s))  POCT GLYCOSYLATED HEMOGLOBIN (HGB A1C)     Status: None   Collection Time    07/22/13  9:40 AM      Result Value Ref Range   Hemoglobin A1C 7.6      A/P (as seen in Problem list)  Tobacco abuse Pt very motivated to quit.  Chantix not an option Start welbutrin adn nicotine 61m patch F/u in 2-4 wks  Osteoporosis, unspecified Due to GI intolerance will change Fosomax to once weekly (747m Continue Vit D. Discuss discontinuation in 2019-2024.    HYPERTENSION, BENIGN SYSTEMIC At goal no change  DIABETES MELLITUS II, UNCOMPLICATED Cont glipizide adn metformin A1c w/ slight improvement. At goal (below 8) Pt to try diet and exercise  Consider adding additional oral such as Januvia if increases A1c.   GOUT NOS DC allopurinol as infrequent flares Cont Colchycine for prn flare cosider restarting allopurinol if flares come on frequently

## 2013-08-10 ENCOUNTER — Telehealth: Payer: Self-pay | Admitting: *Deleted

## 2013-08-10 DIAGNOSIS — D134 Benign neoplasm of liver: Secondary | ICD-10-CM

## 2013-08-10 NOTE — Telephone Encounter (Signed)
Message copied by Hulan Saas on Tue Aug 10, 2013 11:07 AM ------      Message from: Nicoletta Ba S      Created: Tue Aug 10, 2013 11:03 AM       Yes she does- please schedule for sometime in June.. Hepatic adenoma reassess for stability      ----- Message -----         From: Hulan Saas, RN         Sent: 08/09/2013   1:53 PM           To: Amy S [place], PA-C            Amy,       I have a note that this patient needs an MRI liver to f/u on hepatic adenoma. Just want to be sure this is still needed.      Josefa Syracuse       ------

## 2013-08-10 NOTE — Telephone Encounter (Signed)
Scheduled MRI of liver on 09/02/13 9:00 AM per Irine Seal. Needs creatinine level. Order in [place]. NPO 4 hours prior. Patient notified of appointment, date, time and instructions. She will come for lab here prior to 09/02/13

## 2013-08-17 ENCOUNTER — Ambulatory Visit: Payer: Medicare HMO | Admitting: Family Medicine

## 2013-08-23 ENCOUNTER — Ambulatory Visit (INDEPENDENT_AMBULATORY_CARE_PROVIDER_SITE_OTHER): Payer: Medicare HMO | Admitting: Family Medicine

## 2013-08-23 ENCOUNTER — Telehealth: Payer: Self-pay | Admitting: Family Medicine

## 2013-08-23 ENCOUNTER — Encounter: Payer: Self-pay | Admitting: Family Medicine

## 2013-08-23 VITALS — BP 128/86 | HR 70 | Temp 98.6°F | Wt 204.0 lb

## 2013-08-23 DIAGNOSIS — F17200 Nicotine dependence, unspecified, uncomplicated: Secondary | ICD-10-CM

## 2013-08-23 DIAGNOSIS — R5383 Other fatigue: Secondary | ICD-10-CM

## 2013-08-23 DIAGNOSIS — R5381 Other malaise: Secondary | ICD-10-CM

## 2013-08-23 DIAGNOSIS — M810 Age-related osteoporosis without current pathological fracture: Secondary | ICD-10-CM

## 2013-08-23 DIAGNOSIS — Z720 Tobacco use: Secondary | ICD-10-CM

## 2013-08-23 NOTE — Telephone Encounter (Signed)
Pt called and said that she was returning a call from [place]. jw

## 2013-08-23 NOTE — Assessment & Plan Note (Signed)
Likely secondary to carbon monoxide exposure but may also be in part from fosomax overdosing No signs of overt poisoning or possible danger to health Improving

## 2013-08-23 NOTE — Assessment & Plan Note (Signed)
Pt taking fosomax daily instead of wkly This may have something to do with pts overall fatigue and aches.  Stop medication for 2 wks then restart at [facility] dosing.

## 2013-08-23 NOTE — Telephone Encounter (Signed)
Patient calling to give number to [facility] for [facility], [phone]. Please advise.

## 2013-08-23 NOTE — Patient Instructions (Signed)
Your fatigue is likely coming in part from the carbon monoxide you were exposed to as well as taking too much fosomax.  Stop the foxomax for 2 weeks then restart only once weekly Please continue to cut back on the smoking Come back to see me in 4-8 weeks for

## 2013-08-23 NOTE — Assessment & Plan Note (Signed)
Continues to cut back Discussed NQ quitline - for possible assistance w/ patches Pt cutting back and doing very well w/o pharmacologic intervention

## 2013-08-23 NOTE — Telephone Encounter (Signed)
Advised pt to get a carbon monoxide detector per MD.  Pt states "I already have one". Bonnie Peterson
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
## 2013-08-24 ENCOUNTER — Other Ambulatory Visit (INDEPENDENT_AMBULATORY_CARE_PROVIDER_SITE_OTHER): Payer: Medicare HMO

## 2013-08-24 DIAGNOSIS — D134 Benign neoplasm of liver: Secondary | ICD-10-CM

## 2013-08-24 DIAGNOSIS — D135 Benign neoplasm of extrahepatic bile ducts: Secondary | ICD-10-CM

## 2013-08-24 LAB — CREATININE, SERUM: Creatinine, Ser: 0.8 mg/dL (ref 0.4–1.2)

## 2013-08-24 NOTE — Telephone Encounter (Signed)
CSW contacted pt to let her know that [place] faxed PCS form to [employer]. Pt very appreciative.  Hunt Oris, MSW, [place]

## 2013-08-24 NOTE — Telephone Encounter (Signed)
Please call and inform that pt no longer wants their services.  Verbal DC of order.

## 2013-08-24 NOTE — Telephone Encounter (Signed)
Disregard previous message.  Paperwork filled out and sent to [place]

## 2013-09-02 ENCOUNTER — Ambulatory Visit (HOSPITAL_COMMUNITY)
Admission: RE | Admit: 2013-09-02 | Discharge: 2013-09-02 | Disposition: A | Discharge status: Home / Self Care | Payer: Medicare HMO | Source: Ambulatory Visit | Attending: Physician Assistant | Admitting: Physician Assistant

## 2013-09-02 DIAGNOSIS — R160 Hepatomegaly, not elsewhere classified: Secondary | ICD-10-CM | POA: Insufficient documentation

## 2013-09-02 DIAGNOSIS — K7689 Other specified diseases of liver: Secondary | ICD-10-CM | POA: Insufficient documentation

## 2013-09-02 DIAGNOSIS — K449 Diaphragmatic hernia without obstruction or gangrene: Secondary | ICD-10-CM | POA: Insufficient documentation

## 2013-09-02 DIAGNOSIS — D134 Benign neoplasm of liver: Secondary | ICD-10-CM | POA: Insufficient documentation

## 2013-09-02 DIAGNOSIS — D135 Benign neoplasm of extrahepatic bile ducts: Principal | ICD-10-CM | POA: Insufficient documentation

## 2013-09-02 DIAGNOSIS — I517 Cardiomegaly: Secondary | ICD-10-CM | POA: Insufficient documentation

## 2013-09-02 MED ORDER — GADOXETATE DISODIUM 0.25 MMOL/ML IV SOLN
10.0000 mL | Freq: Once | INTRAVENOUS | Status: AC | PRN
  Administered 2013-09-02: 9 mL via INTRAVENOUS

## 2013-09-07 ENCOUNTER — Encounter: Payer: Self-pay | Admitting: Gastroenterology

## 2013-09-07 ENCOUNTER — Ambulatory Visit (INDEPENDENT_AMBULATORY_CARE_PROVIDER_SITE_OTHER): Payer: Medicare PPO | Admitting: Gastroenterology

## 2013-09-07 VITALS — BP 126/78 | HR 80 | Ht 63.0 in | Wt 205.8 lb

## 2013-09-07 DIAGNOSIS — R933 Abnormal findings on diagnostic imaging of other parts of digestive tract: Secondary | ICD-10-CM

## 2013-09-07 NOTE — Progress Notes (Signed)
Review of pertinent gastrointestinal problems: 1. Intrhepatic, "bile duct adenoma": MRI on 02/17/2013 also showed a hyperdense focus within the left lobe of the liver suspicious for early hepatocellular carcinoma however the exam was limited due to motion degradation. CT scan of the abdomen and pelvis which also shows a 3.7 x 2.1 x 2.4 cm arterial enhancing lesion. there were no additional hepatic masses identified hepatic and portal vein patent, spleen and pancreas grossly unremarkable, question possible mild cirrhotic changes  Hepatic panel was last done on 02/10/2013 and was normal ProTime 12.8 INR of 0.9 hepatitis serologies were negative with the exception of hepatitis B surface antibody positive. Alpha-fetoprotein and CEA were normal. 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 Slightly enlarged from 2.3 x 2.2 cm on the prior exam (when remeasured).  Planning to repeat MRI in 1 year. 2. Adenomatous colon polyps: Colonsocopy [name] 06/2013 for minor rectal bleeding: two small TAs removed (recall 5 years) 3. 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  HPI: This is a very pleasant 68 year old woman who is here with her daughter today.  Very minor lower abdominal pains.  No trouble with constipation.  No serious diarrhea.  Had overt blood in her stool 01/2013, none since.  Overall her weight  Is down 13 pounds unintentionally.   Past Medical History  Diagnosis Date  . Obstructive sleep apnea   . HTN, goal below 130/80   . HLD (hyperlipidemia)   . Benign positional vertigo   . Depression   . Anxiety   . Osteoarthritis (arthritis due to wear and tear of joints)   . Irritable bowel syndrome   . Obesity, Class III, BMI 40-49.9 (morbid obesity)   . Gout   . Restrictive lung disease   . Echocardiogram findings abnormal, without  diagnosis 10/10    10/10: mild pulm HTN, EF 60-65%, mild LVH, moderate aortic regurg  . Early cataracts, bilateral 10/13    Optho, Dr Gershon Crane  . COPD (chronic obstructive pulmonary disease)     Past Surgical History  Procedure Laterality Date  . Partial hysterectomy    . Rotator cuff repair        L rotator cuff repair 11/07-[facility] - 12/7/200  . Tonsillectomy    . Liver biopsy      Current Outpatient Prescriptions  Medication Sig Dispense Refill  . albuterol (PROVENTIL HFA;VENTOLIN HFA) 108 (90 BASE) MCG/ACT inhaler Inhale 2 puffs into the lungs every 4 (four) hours as needed for wheezing or shortness of breath.      [name] alendronate (FOSAMAX) 70 MG tablet Take 1 tablet (70 mg total) by mouth every 7 (seven) days. Take with a full glass of water on an empty stomach.  4 tablet  11  . amLODipine (NORVASC) 10 MG tablet Take 1 tablet (10 mg total) by mouth daily with breakfast.  90 tablet  3  . aspirin 81 MG chewable tablet Chew 81 mg by mouth daily.        [name] atenolol (TENORMIN) 50 MG tablet Take 0.5 tablets (25 mg total) by mouth daily with breakfast.  90 tablet  3  . atorvastatin (LIPITOR) 40 MG tablet Take 1 tablet (40 mg total) by mouth daily with breakfast.  90 tablet  3  . baclofen (LIORESAL) 10 MG tablet Take 1 tablet (10 mg total) by mouth 3 (three)  times daily as needed for muscle spasms.  90 each  11  . Blood Glucose Calibration (ACCU-CHEK AVIVA) SOLN Use as directed  1 each  3  . Blood Glucose Monitoring Suppl (ACCU-CHEK AVIVA PLUS) W/DEVICE KIT Use as directed  1 kit  1  . buPROPion (WELLBUTRIN SR) 150 MG 12 hr tablet 150 mg once daily for 3 days; increase to 150 mg twice daily  60 tablet  6  . cholecalciferol (VITAMIN D) 1000 UNITS tablet Take 1 tablet (1,000 Units total) by mouth daily.  30 tablet  11  . colchicine 0.6 MG tablet Take 1 tablet (0.6 mg total) by mouth daily as needed (gout flare).  30 tablet  1  . enalapril (VASOTEC) 20 MG tablet Take 1 tablet (20 mg total) by  mouth daily with breakfast.  90 tablet  3  . fish oil-omega-3 fatty acids 1000 MG capsule Take 2 g by mouth once a week.       . fluticasone (FLONASE) 50 MCG/ACT nasal spray Place 1 spray into the nose daily as needed for rhinitis.      [name] glipiZIDE (GLUCOTROL) 10 MG tablet Take 2 tablets (20 mg total) by mouth 2 (two) times daily before a meal.  180 tablet  3  . glucose blood test strip Use as instructed  100 each  12  . hydrochlorothiazide (HYDRODIURIL) 25 MG tablet Take 1 tablet (25 mg total) by mouth daily with breakfast.  90 tablet  3  . hydrocortisone cream 1 % Apply 1 application topically as needed (breast tenderness).      . Hyprom-Naphaz-Polysorb-Zn Sulf (CLEAR EYES COMPLETE OP) Place 1 drop into both eyes daily as needed (dry eyes).       . Lancets (ACCU-CHEK SOFT TOUCH) lancets Use as instructed  100 each  12  . loratadine (CLARITIN) 10 MG tablet Take 1 tablet (10 mg total) by mouth daily as needed for allergies.  30 tablet  3  . metFORMIN (GLUCOPHAGE) 1000 MG tablet Take 1 tablet (1,000 mg total) by mouth 2 (two) times daily.  180 tablet  3  . nicotine (NICODERM CQ - DOSED IN MG/24 HR) 7 mg/24hr patch Place 1 patch (7 mg total) onto the skin daily.  28 patch  3  . tiotropium (SPIRIVA) 18 MCG inhalation capsule Place 18 mcg into inhaler and inhale as needed (if its hot outside).      [name] VITAMIN E PO Take 1 tablet by mouth once a week. Take with the vitamin D       No current facility-administered medications for this visit.    Allergies as of 09/07/2013  . (No Known Allergies)    Family History  Problem Relation Age of Onset  . Breast cancer Mother   . Hypertension Mother   . Coronary artery disease Mother   . Diabetes type II Sister   . Pancreatic cancer Sister   . Diabetes type II Mother     History   Social History  . Marital Status: Single    Spouse Name: N/A    Number of Children: 4  . Years of Education: N/A   Occupational History  . unemployed    Social  History Main Topics  . Smoking status: Current Every Day Smoker -- 0.50 packs/day for 40 years    Types: Cigarettes  . Smokeless tobacco: Never Used  . Alcohol Use: Yes     Comment: occasionally  . Drug Use: No  . Sexual Activity: Not on file  Other Topics Concern  . Not on file   Social History Narrative  . No narrative on file      Physical Exam: BP 126/78  Pulse 80  Ht _0  (1.6 m)  Wt 205 lb 12.8 oz (93.35 kg)  BMI 36.46 kg/m2 Constitutional: Obese Psychiatric: alert and oriented x3 Abdomen: soft, nontender, nondistended, no obvious ascites, no peritoneal signs, normal bowel sounds     Assessment and plan: 68 y.o. female with bile duct adenoma, sigmoid lipoma, adenomatous polyps in the colon  First her bile duct adenoma is really about the same size as it was last year. These are benign lesions but may grow. We will repeat MRI in one year from her last imaging which would be about 11 months from now. The lipoma I saw on her colonoscopy was pretty sizable, about 2-1/2-3 cm. I was able to see it on her CAT scan from 6 months ago however it was not commented on in the radiology report. I measured it to be 2.6 cm on CAT scan. It does not seem to be causing her any symptoms suggested against any surgical resection. She will symptoms follow clinically for now.

## 2013-09-07 NOTE — Patient Instructions (Addendum)
One of your biggest health concerns is your smoking.  This increases your risk for most cancers and serious cardiovascular diseases such as strokes, heart attacks.  You should try your best to stop.  If you need assistance, please contact your PCP or Smoking Cessation Class at [facility] [phone]) or [place] (1-800-QUIT-NOW). The lipoma in your sigmoid colon is probably not causing any symptoms. If you have serious abdominal pains, vomiting, distension then would consider surgical referral.  You will need repeat imaging of your liver May 2016 (MRI, already in reminder system).

## 2013-09-14 ENCOUNTER — Ambulatory Visit (HOSPITAL_COMMUNITY): Payer: Medicare HMO

## 2013-10-09 ENCOUNTER — Emergency Department (HOSPITAL_COMMUNITY)
Admission: EM | Admit: 2013-10-09 | Discharge: 2013-10-09 | Disposition: A | Discharge status: Home / Self Care | Payer: Medicare HMO | Attending: Emergency Medicine | Admitting: Emergency Medicine

## 2013-10-09 ENCOUNTER — Encounter (HOSPITAL_COMMUNITY): Payer: Self-pay | Admitting: Emergency Medicine

## 2013-10-09 DIAGNOSIS — H269 Unspecified cataract: Secondary | ICD-10-CM | POA: Diagnosis not present

## 2013-10-09 DIAGNOSIS — F411 Generalized anxiety disorder: Secondary | ICD-10-CM | POA: Diagnosis not present

## 2013-10-09 DIAGNOSIS — M109 Gout, unspecified: Secondary | ICD-10-CM | POA: Insufficient documentation

## 2013-10-09 DIAGNOSIS — F3289 Other specified depressive episodes: Secondary | ICD-10-CM | POA: Diagnosis not present

## 2013-10-09 DIAGNOSIS — N3000 Acute cystitis without hematuria: Secondary | ICD-10-CM

## 2013-10-09 DIAGNOSIS — F329 Major depressive disorder, single episode, unspecified: Secondary | ICD-10-CM | POA: Diagnosis not present

## 2013-10-09 DIAGNOSIS — J449 Chronic obstructive pulmonary disease, unspecified: Secondary | ICD-10-CM | POA: Diagnosis not present

## 2013-10-09 DIAGNOSIS — I10 Essential (primary) hypertension: Secondary | ICD-10-CM | POA: Insufficient documentation

## 2013-10-09 DIAGNOSIS — M1990 Unspecified osteoarthritis, unspecified site: Secondary | ICD-10-CM | POA: Diagnosis not present

## 2013-10-09 DIAGNOSIS — Z79899 Other long term (current) drug therapy: Secondary | ICD-10-CM | POA: Insufficient documentation

## 2013-10-09 DIAGNOSIS — J4489 Other specified chronic obstructive pulmonary disease: Secondary | ICD-10-CM | POA: Insufficient documentation

## 2013-10-09 DIAGNOSIS — Z7982 Long term (current) use of aspirin: Secondary | ICD-10-CM | POA: Insufficient documentation

## 2013-10-09 DIAGNOSIS — F17200 Nicotine dependence, unspecified, uncomplicated: Secondary | ICD-10-CM | POA: Insufficient documentation

## 2013-10-09 DIAGNOSIS — R55 Syncope and collapse: Secondary | ICD-10-CM | POA: Diagnosis present

## 2013-10-09 DIAGNOSIS — IMO0002 Reserved for concepts with insufficient information to code with codable children: Secondary | ICD-10-CM | POA: Insufficient documentation

## 2013-10-09 LAB — CBC WITH DIFFERENTIAL/PLATELET
Basophils Absolute: 0 10*3/uL (ref 0.0–0.1)
Basophils Relative: 0 % (ref 0–1)
Eosinophils Absolute: 0 10*3/uL (ref 0.0–0.7)
Eosinophils Relative: 0 % (ref 0–5)
HCT: 42.4 % (ref 36.0–46.0)
Hemoglobin: 14 g/dL (ref 12.0–15.0)
Lymphocytes Relative: 19 % (ref 12–46)
Lymphs Abs: 2 10*3/uL (ref 0.7–4.0)
MCH: 28.7 pg (ref 26.0–34.0)
MCHC: 33 g/dL (ref 30.0–36.0)
MCV: 86.9 fL (ref 78.0–100.0)
Monocytes Absolute: 0.7 10*3/uL (ref 0.1–1.0)
Monocytes Relative: 6 % (ref 3–12)
Neutro Abs: 8.1 10*3/uL — ABNORMAL HIGH (ref 1.7–7.7)
Neutrophils Relative %: 75 % (ref 43–77)
Platelets: 182 10*3/uL (ref 150–400)
RBC: 4.88 MIL/uL (ref 3.87–5.11)
RDW: 13.5 % (ref 11.5–15.5)
WBC: 10.8 10*3/uL — ABNORMAL HIGH (ref 4.0–10.5)

## 2013-10-09 LAB — URINALYSIS, ROUTINE W REFLEX MICROSCOPIC
Bilirubin Urine: NEGATIVE
Glucose, UA: NEGATIVE mg/dL
Hgb urine dipstick: NEGATIVE
Ketones, ur: NEGATIVE mg/dL
Nitrite: POSITIVE — AB
Protein, ur: NEGATIVE mg/dL
Specific Gravity, Urine: 1.012 (ref 1.005–1.030)
Urobilinogen, UA: 0.2 mg/dL (ref 0.0–1.0)
pH: 6 (ref 5.0–8.0)

## 2013-10-09 LAB — I-STAT TROPONIN, ED: Troponin i, poc: 0.01 ng/mL (ref 0.00–0.08)

## 2013-10-09 LAB — BASIC METABOLIC PANEL
Anion gap: 16 — ABNORMAL HIGH (ref 5–15)
BUN: 15 mg/dL (ref 6–23)
CO2: 25 mEq/L (ref 19–32)
Calcium: 9.7 mg/dL (ref 8.4–10.5)
Chloride: 93 mEq/L — ABNORMAL LOW (ref 96–112)
Creatinine, Ser: 0.87 mg/dL (ref 0.50–1.10)
GFR calc Af Amer: 78 mL/min — ABNORMAL LOW (ref >90)
GFR calc non Af Amer: 67 mL/min — ABNORMAL LOW (ref >90)
Glucose, Bld: 181 mg/dL — ABNORMAL HIGH (ref 70–99)
Potassium: 5.6 mEq/L — ABNORMAL HIGH (ref 3.7–5.3)
Sodium: 134 mEq/L — ABNORMAL LOW (ref 137–147)

## 2013-10-09 LAB — URINE MICROSCOPIC-ADD ON

## 2013-10-09 LAB — POTASSIUM: Potassium: 4.2 mEq/L (ref 3.7–5.3)

## 2013-10-09 MED ORDER — DEXTROSE 5 % IV SOLN
1.0000 g | Freq: Once | INTRAVENOUS | Status: AC
  Administered 2013-10-09: 1 g via INTRAVENOUS
  Filled 2013-10-09: qty 10

## 2013-10-09 MED ORDER — CEPHALEXIN 500 MG PO CAPS: 500.0000 mg | ORAL_CAPSULE | Freq: Four times a day (QID) | ORAL | Status: DC

## 2013-10-09 NOTE — ED Provider Notes (Signed)
CSN: [id number]     Arrival date & time 10/09/13  1904 History   First MD Initiated Contact with Patient 10/09/13 1910     Chief Complaint  Patient presents with  . Possible Syncolpal Episode      (Consider location/radiation/quality/duration/timing/severity/associated sxs/prior Treatment) HPI Pt presenting c/o feeling faint and fatigued with some lightheadedness.  No chest pain, no palpitations.  No headache or vomiting.  She states she was at a barbecue with family and friends and kept feeling the need to sit down or she felt she would faint.  No fever/chills.  Upon EMS arrival she was anxious and hyperventilating but was able to calm down with reassurance.  There are no other associated systemic symptoms, there are no other alleviating or modifying factors.  No shortness of breath.   Past Medical History  Diagnosis Date  . Obstructive sleep apnea   . HTN, goal below 130/80   . HLD (hyperlipidemia)   . Benign positional vertigo   . Depression   . Anxiety   . Osteoarthritis (arthritis due to wear and tear of joints)   . Irritable bowel syndrome   . Obesity, Class III, BMI 40-49.9 (morbid obesity)   . Gout   . Restrictive lung disease   . Echocardiogram findings abnormal, without diagnosis 10/10    10/10: mild pulm HTN, EF 60-65%, mild LVH, moderate aortic regurg  . Early cataracts, bilateral 10/13    Optho, Dr Gershon Crane  . COPD (chronic obstructive pulmonary disease)    Past Surgical History  Procedure Laterality Date  . Partial hysterectomy    . Rotator cuff repair        L rotator cuff repair 11/07-[facility] - 12/7/200  . Tonsillectomy    . Liver biopsy     Family History  Problem Relation Age of Onset  . Breast cancer Mother   . Hypertension Mother   . Coronary artery disease Mother   . Diabetes type II Sister   . Pancreatic cancer Sister   . Diabetes type II Mother    History  Substance Use Topics  . Smoking status: Current Every Day Smoker -- 0.50 packs/day for 40  years    Types: Cigarettes  . Smokeless tobacco: Never Used  . Alcohol Use: Yes     Comment: occasionally   OB History   Grav Para Term Preterm Abortions TAB SAB Ect Mult Living                 Review of Systems ROS reviewed and all otherwise negative except for mentioned in HPI    Allergies  Review of patient's allergies indicates no known allergies.  Home Medications   Prior to Admission medications   Medication Sig Start Date End Date Taking? Authorizing Provider  albuterol (PROVENTIL HFA;VENTOLIN HFA) 108 (90 BASE) MCG/ACT inhaler Inhale 2 puffs into the lungs every 4 (four) hours as needed for wheezing or shortness of breath. 09/23/12  Yes Waldemar Dickens, MD  amLODipine (NORVASC) 10 MG tablet Take 1 tablet (10 mg total) by mouth daily with breakfast. 06/14/13  Yes Waldemar Dickens, MD  aspirin 81 MG chewable tablet Chew 81 mg by mouth daily.     Yes Historical Provider, MD  atenolol (TENORMIN) 50 MG tablet Take 0.5 tablets (25 mg total) by mouth daily with breakfast. 06/14/13  Yes Waldemar Dickens, MD  atorvastatin (LIPITOR) 40 MG tablet Take 1 tablet (40 mg total) by mouth daily with breakfast. 06/14/13  Yes Waldemar Dickens, MD  baclofen (LIORESAL) 10 MG tablet Take 1 tablet (10 mg total) by mouth 3 (three) times daily as needed for muscle spasms. 06/29/13  Yes Waldemar Dickens, MD  cholecalciferol (VITAMIN D) 1000 UNITS tablet Take 1 tablet (1,000 Units total) by mouth daily. 07/22/13  Yes Waldemar Dickens, MD  colchicine 0.6 MG tablet Take 1 tablet (0.6 mg total) by mouth daily as needed (gout flare). 06/14/13  Yes Waldemar Dickens, MD  enalapril (VASOTEC) 20 MG tablet Take 1 tablet (20 mg total) by mouth daily with breakfast. 06/14/13  Yes Waldemar Dickens, MD  fish oil-omega-3 fatty acids 1000 MG capsule Take 1 g by mouth every 30 (thirty) days.    Yes Historical Provider, MD  fluticasone (FLONASE) 50 MCG/ACT nasal spray Place 1 spray into the nose daily as needed for rhinitis. 03/17/12  Yes  Waldemar Dickens, MD  glipiZIDE (GLUCOTROL) 10 MG tablet Take 2 tablets (20 mg total) by mouth 2 (two) times daily before a meal. 06/14/13  Yes Waldemar Dickens, MD  hydrochlorothiazide (HYDRODIURIL) 25 MG tablet Take 1 tablet (25 mg total) by mouth daily with breakfast. 06/14/13  Yes Waldemar Dickens, MD  Hyprom-Naphaz-Polysorb-Zn Sulf (CLEAR EYES COMPLETE OP) Place 1 drop into both eyes daily as needed (dry eyes).    Yes Historical Provider, MD  loratadine (CLARITIN) 10 MG tablet Take 1 tablet (10 mg total) by mouth daily as needed for allergies. 09/23/12  Yes Waldemar Dickens, MD  metFORMIN (GLUCOPHAGE) 1000 MG tablet Take 1 tablet (1,000 mg total) by mouth 2 (two) times daily. 06/14/13  Yes Waldemar Dickens, MD  tiotropium (SPIRIVA) 18 MCG inhalation capsule Place 18 mcg into inhaler and inhale as needed (if its hot outside).   Yes Historical Provider, MD  VITAMIN E PO Take 1 tablet by mouth every 30 (thirty) days.    Yes Historical Provider, MD  cephALEXin (KEFLEX) 500 MG capsule Take 1 capsule (500 mg total) by mouth 4 (four) times daily. 10/09/13   Threasa Beards, MD   BP 110/55  Pulse 69  Temp(Src) 97.6 F (36.4 C) (Oral)  Resp 16  SpO2 96% Vitals reviewed Physical Exam Physical Examination: General appearance - alert, well appearing, and in no distress Mental status - alert, oriented to person, place, and time Eyes - pupils equal and reactive, extraocular eye movements intact Mouth - mucous membranes moist, pharynx normal without lesions Chest - clear to auscultation, no wheezes, rales or rhonchi, symmetric air entry Heart - normal rate, regular rhythm, normal S1, S2, no murmurs, rubs, clicks or gallops Abdomen - soft, nontender, nondistended, no masses or organomegaly Neurological - alert, oriented x 3, cranial nerves tested and intact, strength 5/5 in extremities x 4, sensation intact Extremities - peripheral pulses normal, no pedal edema, no clubbing or cyanosis Skin - normal coloration and  turgor, no rashes  ED Course  Procedures (including critical care time) Labs Review Labs Reviewed  CBC WITH DIFFERENTIAL - Abnormal; Notable for the following:    WBC 10.8 (*)    Neutro Abs 8.1 (*)    All other components within normal limits  BASIC METABOLIC PANEL - Abnormal; Notable for the following:    Sodium 134 (*)    Potassium 5.6 (*)    Chloride 93 (*)    Glucose, Bld 181 (*)    GFR calc non Af Amer 67 (*)    GFR calc Af Amer 78 (*)    Anion gap 16 (*)  All other components within normal limits  URINALYSIS, ROUTINE W REFLEX MICROSCOPIC - Abnormal; Notable for the following:    APPearance CLOUDY (*)    Nitrite POSITIVE (*)    Leukocytes, UA TRACE (*)    All other components within normal limits  URINE MICROSCOPIC-ADD ON - Abnormal; Notable for the following:    Bacteria, UA MANY (*)    All other components within normal limits  POTASSIUM  I-STAT TROPOININ, ED    Imaging Review No results found.   EKG Interpretation   Date/Time:  Saturday October 09 2013 19:28:23 EDT Ventricular Rate:  75 PR Interval:  193 QRS Duration: 72 QT Interval:  393 QTC Calculation: 439 R Axis:   26 Text Interpretation:  Sinus rhythm Minimal ST elevation, inferior leads  Sinus rhythm Early repolarization pattern No significant change since last  tracing Abnormal ekg Confirmed by Carmin Muskrat  MD [phone]) on 10/09/2013  7:32:06 PM      MDM   Final diagnoses:  Near syncope  Acute cystitis without hematuria    Pt prsenting with weakness and near syncope, pt found to have UTI, EKG reassuring, pt not anemic.  Not orthostatic.  Pt treated with IV fluids and started on rocephin.  Pt discharged to take keflex po for UTI.  Discharged with strict return precautions.  Pt agreeable with plan.  Prior records reviewed and considered during this visit Nursing notes including past medical history and social history reviewed and considered in documentation     Threasa Beards,  MD 10/10/13 [phone]

## 2013-10-09 NOTE — ED Notes (Signed)
Pt BIB EMS. Pt states she was cooking food in a hot kitchen and began to feel faint. EMS states pt was alert, oriented. Pt told EMS "I'm going to pass out" and did not. EMS performed 12 Lead EKG and orthostatic vitals, both of which were unremarkable. Pt was hyperventilating and anxious and was told by EMS to slow her breathing down. Pt alert, no acute distress. Skin warm and dry.

## 2013-10-09 NOTE — Discharge Instructions (Signed)
Return to the ED with any concerns including chest pain, palpitations, vomiting and not able to keep down liquids or antibiotics, decreased level of alertness/lethargy, or any other alarming symptoms

## 2013-10-27 ENCOUNTER — Ambulatory Visit: Payer: Medicare HMO | Admitting: Family Medicine

## 2013-11-15 ENCOUNTER — Encounter: Payer: Self-pay | Admitting: Family Medicine

## 2013-11-15 ENCOUNTER — Ambulatory Visit (INDEPENDENT_AMBULATORY_CARE_PROVIDER_SITE_OTHER): Payer: Medicare HMO | Admitting: Family Medicine

## 2013-11-15 VITALS — BP 149/78 | HR 71 | Temp 98.1°F | Ht 63.0 in | Wt 201.0 lb

## 2013-11-15 DIAGNOSIS — R300 Dysuria: Secondary | ICD-10-CM

## 2013-11-15 DIAGNOSIS — I10 Essential (primary) hypertension: Secondary | ICD-10-CM

## 2013-11-15 DIAGNOSIS — E1169 Type 2 diabetes mellitus with other specified complication: Secondary | ICD-10-CM

## 2013-11-15 DIAGNOSIS — E119 Type 2 diabetes mellitus without complications: Secondary | ICD-10-CM

## 2013-11-15 DIAGNOSIS — G4733 Obstructive sleep apnea (adult) (pediatric): Secondary | ICD-10-CM

## 2013-11-15 LAB — POCT GLYCOSYLATED HEMOGLOBIN (HGB A1C): Hemoglobin A1C: 7.3

## 2013-11-15 NOTE — Patient Instructions (Signed)
Near-Syncope Near-syncope (commonly known as near fainting) is sudden weakness, dizziness, or feeling like you might pass out. During an episode of near-syncope, you may also develop pale skin, have tunnel vision, or feel sick to your stomach (nauseous). Near-syncope may occur when getting up after sitting or while standing for a long time. It is caused by a sudden decrease in blood flow to the brain. This decrease can result from various causes or triggers, most of which are not serious. However, because near-syncope can sometimes be a sign of something serious, a medical evaluation is required. The specific cause is often not determined. HOME CARE INSTRUCTIONS  Monitor your condition for any changes. The following actions may help to alleviate any discomfort you are experiencing:  Have someone stay with you until you feel stable.  Lie down right away and prop your feet up if you start feeling like you might faint. Breathe deeply and steadily. Wait until all the symptoms have passed. Most of these episodes last only a few minutes. You may feel tired for several hours.   Drink enough fluids to keep your urine clear or pale yellow.   If you are taking blood pressure or heart medicine, get up slowly when seated or lying down. Take several minutes to sit and then stand. This can reduce dizziness.  Follow up with your health care provider as directed. SEEK IMMEDIATE MEDICAL CARE IF:   You have a severe headache.   You have unusual pain in the chest, abdomen, or back.   You are bleeding from the mouth or rectum, or you have black or tarry stool.   You have an irregular or very fast heartbeat.   You have repeated fainting or have seizure-like jerking during an episode.   You faint when sitting or lying down.   You have confusion.   You have difficulty walking.   You have severe weakness.   You have vision problems.  MAKE SURE YOU:   Understand these instructions.  Will  watch your condition.  Will get help right away if you are not doing well or get worse. Document Released: 03/25/2005 Document Revised: 03/30/2013 Document Reviewed: 08/28/2012 [facility] Patient Information 2015 [place], [place]. This information is not intended to replace advice given to you by your health care provider. Make sure you discuss any questions you have with your health care provider. Syncope Syncope is a medical term for fainting or passing out. This means you lose consciousness and drop to the ground. People are generally unconscious for less than 5 minutes. You may have some muscle twitches for up to 15 seconds before waking up and returning to normal. Syncope occurs more often in older adults, but it can happen to anyone. While most causes of syncope are not dangerous, syncope can be a sign of a serious medical problem. It is important to seek medical care.  CAUSES  Syncope is caused by a sudden drop in blood flow to the brain. The specific cause is often not determined. Factors that can bring on syncope include:  Taking medicines that lower blood pressure.  Sudden changes in posture, such as standing up quickly.  Taking more medicine than prescribed.  Standing in one place for too long.  Seizure disorders.  Dehydration and excessive exposure to heat.  Low blood sugar (hypoglycemia).  Straining to have a bowel movement.  Heart disease, irregular heartbeat, or other circulatory problems.  Fear, emotional distress, seeing blood, or severe pain. SYMPTOMS  Right before fainting, you may:  Feel dizzy or light-headed.  Feel nauseous.  See all white or all black in your field of vision.  Have cold, clammy skin. DIAGNOSIS  Your health care provider will ask about your symptoms, perform a physical exam, and perform an electrocardiogram (ECG) to record the electrical activity of your heart. Your health care provider may also perform other heart or blood tests to determine  the cause of your syncope which may include:  Transthoracic echocardiogram (TTE). During echocardiography, sound waves are used to evaluate how blood flows through your heart.  Transesophageal echocardiogram (TEE).  Cardiac monitoring. This allows your health care provider to monitor your heart rate and rhythm in real time.  Holter monitor. This is a portable device that records your heartbeat and can help diagnose heart arrhythmias. It allows your health care provider to track your heart activity for several days, if needed.  Stress tests by exercise or by giving medicine that makes the heart beat faster. TREATMENT  In most cases, no treatment is needed. Depending on the cause of your syncope, your health care provider may recommend changing or stopping some of your medicines. HOME CARE INSTRUCTIONS  Have someone stay with you until you feel stable.  Do not drive, use machinery, or play sports until your health care provider says it is okay.  Keep all follow-up appointments as directed by your health care provider.  Lie down right away if you start feeling like you might faint. Breathe deeply and steadily. Wait until all the symptoms have passed.  Drink enough fluids to keep your urine clear or pale yellow.  If you are taking blood pressure or heart medicine, get up slowly and take several minutes to sit and then stand. This can reduce dizziness. SEEK IMMEDIATE MEDICAL CARE IF:   You have a severe headache.  You have unusual pain in the chest, abdomen, or back.  You are bleeding from your mouth or rectum, or you have black or tarry stool.  You have an irregular or very fast heartbeat.  You have pain with breathing.  You have repeated fainting or seizure-like jerking during an episode.  You faint when sitting or lying down.  You have confusion.  You have trouble walking.  You have severe weakness.  You have vision problems. If you fainted, call your local emergency  services (911 in [place] Do not drive yourself to the hospital.  MAKE SURE YOU:  Understand these instructions.  Will watch your condition.  Will get help right away if you are not doing well or get worse. Document Released: 03/25/2005 Document Revised: 03/30/2013 Document Reviewed: 05/24/2011 [facility] Patient Information 2015 [place], [place]. This information is not intended to replace advice given to you by your health care provider. Make sure you discuss any questions you have with your health care provider.

## 2013-11-16 LAB — BASIC METABOLIC PANEL
BUN: 14 mg/dL (ref 6–23)
CO2: 27 mEq/L (ref 19–32)
Calcium: 9.9 mg/dL (ref 8.4–10.5)
Chloride: 103 mEq/L (ref 96–112)
Creat: 0.6 mg/dL (ref 0.50–1.10)
Glucose, Bld: 68 mg/dL — ABNORMAL LOW (ref 70–99)
Potassium: 3.8 mEq/L (ref 3.5–5.3)
Sodium: 142 mEq/L (ref 135–145)

## 2013-11-21 NOTE — Assessment & Plan Note (Signed)
Asymptomatic at this time 

## 2013-11-21 NOTE — Progress Notes (Signed)
Patient ID: Bonnie Peterson, female   DOB: [birth year], 68 y.o.   MRN: [id number]    [facility] Aquilla Hacker, MD Phone: [phone]  Subjective:   # Patient is here to establish care - She says that she would like to establish care.   - Only complaint at this time is two episodes of near syncope with the first being on 7/4, and the second two weeks ago. She went to the ED and was given fluids with subsequent discharge. The first episode occurred while cooking outdoors. She felt lightheaded and nearly collapsed to the ground. She was evaluated in the ED, with negative workup for cardiac / vascular etiologies at the time. She then says that she subsequently experienced an episode of lightheadedness / nausea with near syncope at home after taking vitamins on an empty stomach. She was seated at the time, and she says that she placed ice around her neck and head, and received relief from her lightheadedness and nausea. She denies palpitations, or focal neurological deficits at that time.   All relevant systems were reviewed and were negative unless otherwise noted in the HPI  Past Medical History Patient Active Problem List   Diagnosis Date Noted  . Other malaise and fatigue 08/23/2013  . 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 IBS (irritable bowel syndrome) 03/22/2013  . Pain in joint, shoulder region 11/12/2012  . Osteoporosis, unspecified 10/15/2012  . Routine health maintenance 09/23/2012  . Vitamin D deficiency 12/23/2011  . Tobacco abuse 10/12/2010  . COPD (chronic obstructive pulmonary disease) 06/14/2010  . OBSTRUCTIVE SLEEP APNEA 04/27/2009  . OBESITY, UNSPECIFIED 04/26/2009  . DYSLIPIDEMIA 11/25/2007  . GOUT NOS 12/24/2006  . DIABETES MELLITUS II, UNCOMPLICATED 38/45/3646  . DEPRESSION, MAJOR, RECURRENT 06/05/2006  . ANXIETY 06/05/2006  . HYPERTENSION, BENIGN SYSTEMIC 06/05/2006  . IRRITABLE BOWEL SYNDROME 06/05/2006  . OSTEOARTHRITIS, MULTI SITES 06/05/2006    Reviewed problem list.  Medications- reviewed and updated Chief complaint-noted No additions to family history Social history- patient is never smoker  Objective: BP 149/78  Pulse 71  Temp(Src) 98.1 F (36.7 C) (Oral)  Ht 5\' 3"  (1.6 m)  Wt 91.173 kg (201 lb)  BMI 35.61 kg/m2 Gen: NAD, alert, cooperative with exam HEENT: NCAT, EOMI, PERRL, TMs nml Neck: FROM, supple CV: RRR, good S1/S2, no murmur, cap refill <3 Resp: CTABL, no wheezes, non-labored Abd: SNTND, BS present, no guarding or organomegaly Ext: No edema, warm, normal tone, moves UE/LE spontaneously Neuro: Alert and oriented, No gross deficits Skin: no rashes no lesions  Assessment/Plan: See problem based a/p

## 2013-11-21 NOTE — Assessment & Plan Note (Signed)
Well controlled at this time. Continue Glipizide and actos. Will recheck A1c in 3 months.

## 2013-11-21 NOTE — Assessment & Plan Note (Signed)
Continue current therapy. Blood pressure appropriate today. Will adjust as needed.

## 2013-12-22 ENCOUNTER — Ambulatory Visit: Payer: Medicare HMO | Admitting: Family Medicine

## 2013-12-27 ENCOUNTER — Other Ambulatory Visit: Payer: Self-pay | Admitting: Family Medicine

## 2013-12-27 DIAGNOSIS — Z1231 Encounter for screening mammogram for malignant neoplasm of breast: Secondary | ICD-10-CM

## 2014-01-03 ENCOUNTER — Encounter: Payer: Self-pay | Admitting: Family Medicine

## 2014-01-03 ENCOUNTER — Ambulatory Visit (INDEPENDENT_AMBULATORY_CARE_PROVIDER_SITE_OTHER): Payer: Medicare HMO | Admitting: Family Medicine

## 2014-01-03 VITALS — BP 160/60 | HR 74 | Temp 98.0°F | Resp 16 | Wt 202.0 lb

## 2014-01-03 DIAGNOSIS — E785 Hyperlipidemia, unspecified: Secondary | ICD-10-CM

## 2014-01-03 DIAGNOSIS — Z720 Tobacco use: Secondary | ICD-10-CM

## 2014-01-03 DIAGNOSIS — Z23 Encounter for immunization: Secondary | ICD-10-CM

## 2014-01-03 DIAGNOSIS — G4733 Obstructive sleep apnea (adult) (pediatric): Secondary | ICD-10-CM

## 2014-01-03 DIAGNOSIS — F17200 Nicotine dependence, unspecified, uncomplicated: Secondary | ICD-10-CM

## 2014-01-03 DIAGNOSIS — J449 Chronic obstructive pulmonary disease, unspecified: Secondary | ICD-10-CM

## 2014-01-03 DIAGNOSIS — E119 Type 2 diabetes mellitus without complications: Secondary | ICD-10-CM

## 2014-01-03 DIAGNOSIS — I10 Essential (primary) hypertension: Secondary | ICD-10-CM

## 2014-01-03 DIAGNOSIS — J4489 Other specified chronic obstructive pulmonary disease: Secondary | ICD-10-CM

## 2014-01-03 MED ORDER — ALBUTEROL SULFATE HFA 108 (90 BASE) MCG/ACT IN AERS: 2.0000 | INHALATION_SPRAY | RESPIRATORY_TRACT | Status: DC | PRN

## 2014-01-03 NOTE — Assessment & Plan Note (Signed)
Now on Metformin 1000mg BID and Glipizide. Has excellent control. Last A1C 7.3 down from 7.6. Glucose 120's - 130's at home.  - Continue current regimen.  - Pt. Received eye exam already at outside provider's office - Repeat A1C at next visit in November, as well as foot exam.

## 2014-01-03 NOTE — Patient Instructions (Signed)
Ms. Tropea,   Thanks for coming in today, it was good to see you.   Please continue to take your blood pressure medicines as prescribed.   I highly recommend that you take your Spiriva daily instead of as needed. It is an important daily medication for COPD.   I have written a prescription for an Inhaler. You can pick this up at your pharmacy.   I have ordered Home Health to come to your house and evaluate you for getting C-Pap supplies as well as Occupational Therapy to evaluate you in order to get your porch rail.   At your next office visit in November, please remain fasting for 8 hours prior to coming to your appointment.   Thanks for letting us take care of you!  Paula Compton, MD Family Medicine - PGY 1

## 2014-01-03 NOTE — Assessment & Plan Note (Signed)
Pt. On lipitor, last lipid profile one year ago.   - Recheck at next visit in November.

## 2014-01-03 NOTE — Assessment & Plan Note (Signed)
Pt. With history of OSA, and need for CPAP. Currently without CPAP supplies i.e. Mask etc.   - Home health care for supplies

## 2014-01-03 NOTE — Assessment & Plan Note (Signed)
Pt. Tolerating current regimen with good prior control. Smoked cigarette prior to coming in to clinic today, which may explain 160/62 BP.   - No changes at this time given history of good control and one isolated elevated blood pressure.  - Will recheck at next visit. If still elevated will adjust regimen

## 2014-01-03 NOTE — Assessment & Plan Note (Signed)
Pt. Smoking 1/2ppd at this point. No desire to quite, but acknowledges need to. Open to discussion.   - pt. Agreed to discuss quitting at next visit in November as well.

## 2014-01-03 NOTE — Progress Notes (Signed)
Patient ID: Bonnie Peterson, female   DOB: [birth year], 68 y.o.   MRN: [id number]   [facility] Aquilla Hacker, MD Phone: [phone]  Subjective:   # Follow Up For Syncope - Pt. States that she has not had any more syncopal spells.She has continued to take her blood pressure medications as prescribed, and she has been drinking lots of water.   # Diabetes Mellitus II  - Pt. With excellent control of her blood sugars at home 120's - 130's consistently.  - Last A1C - 7.3 plan to repeat in November.  - No peripheral neuropathy / numbness at this time.  - States that she has done very well on Metformin and Glipizide.   #Hypertension  - Pt. States that she has been taking her blood pressure medications as prescribed.  - Denies dizziness, lightheadedness. Reports good blood pressure control.   #Tobacco Abuse  - Pt. Reports smoking 1/2 ppd.  - She does acknowledge that she needs to quit, but is not ready yet.  - Pt. Smoked cigarette immediately prior to coming to clinic.   All relevant systems were reviewed and were negative unless otherwise noted in the HPI  Past Medical History Reviewed problem list.  Medications- reviewed and updated Current Outpatient Prescriptions  Medication Sig Dispense Refill  . albuterol (PROVENTIL HFA;VENTOLIN HFA) 108 (90 BASE) MCG/ACT inhaler Inhale 2 puffs into the lungs every 4 (four) hours as needed for wheezing or shortness of breath.  1 Inhaler  0  . amLODipine (NORVASC) 10 MG tablet Take 1 tablet (10 mg total) by mouth daily with breakfast.  90 tablet  3  . aspirin 81 MG chewable tablet Chew 81 mg by mouth daily.        [name] atenolol (TENORMIN) 50 MG tablet Take 0.5 tablets (25 mg total) by mouth daily with breakfast.  90 tablet  3  . atorvastatin (LIPITOR) 40 MG tablet Take 1 tablet (40 mg total) by mouth daily with breakfast.  90 tablet  3  . baclofen (LIORESAL) 10 MG tablet Take 1 tablet (10 mg total) by mouth 3 (three) times  daily as needed for muscle spasms.  90 each  11  . cephALEXin (KEFLEX) 500 MG capsule Take 1 capsule (500 mg total) by mouth 4 (four) times daily.  28 capsule  0  . cholecalciferol (VITAMIN D) 1000 UNITS tablet Take 1 tablet (1,000 Units total) by mouth daily.  30 tablet  11  . colchicine 0.6 MG tablet Take 1 tablet (0.6 mg total) by mouth daily as needed (gout flare).  30 tablet  1  . enalapril (VASOTEC) 20 MG tablet Take 1 tablet (20 mg total) by mouth daily with breakfast.  90 tablet  3  . fish oil-omega-3 fatty acids 1000 MG capsule Take 1 g by mouth every 30 (thirty) days.       . fluticasone (FLONASE) 50 MCG/ACT nasal spray Place 1 spray into the nose daily as needed for rhinitis.      [name] glipiZIDE (GLUCOTROL) 10 MG tablet Take 2 tablets (20 mg total) by mouth 2 (two) times daily before a meal.  180 tablet  3  . hydrochlorothiazide (HYDRODIURIL) 25 MG tablet Take 1 tablet (25 mg total) by mouth daily with breakfast.  90 tablet  3  . Hyprom-Naphaz-Polysorb-Zn Sulf (CLEAR EYES COMPLETE OP) Place 1 drop into both eyes daily as needed (dry eyes).       [name] loratadine (CLARITIN) 10 MG tablet Take 1 tablet (  10 mg total) by mouth daily as needed for allergies.  30 tablet  3  . metFORMIN (GLUCOPHAGE) 1000 MG tablet Take 1 tablet (1,000 mg total) by mouth 2 (two) times daily.  180 tablet  3  . tiotropium (SPIRIVA) 18 MCG inhalation capsule Place 18 mcg into inhaler and inhale as needed (if its hot outside).      [name] VITAMIN E PO Take 1 tablet by mouth every 30 (thirty) days.        No current facility-administered medications for this visit.   Chief complaint-noted No additions to family history Social history- patient is a 1/2 ppd smoker  Objective: BP 160/60  Pulse 74  Temp(Src) 98 F (36.7 C) (Oral)  Resp 16  Wt 202 lb (91.627 kg)  SpO2 100% Gen: NAD, alert, cooperative with exam HEENT: NCAT, EOMI, PERRL, TMs nml Neck: FROM, supple CV: RRR, good W8/G8, 2/6 Systolic Ejection Murmur  LUSB Resp: CTABL, no wheezes, non-labored Abd: SNTND, BS present, no guarding or organomegaly Ext: No edema, warm, normal tone, moves UE/LE spontaneously Neuro: Alert and oriented, No gross deficits Skin: no rashes no lesions  Assessment/Plan: See problem based a/p

## 2014-01-07 ENCOUNTER — Encounter: Payer: Self-pay | Admitting: Family Medicine

## 2014-01-07 NOTE — Progress Notes (Signed)
Form dropped off to be filled out for patient to have rails put up on her porch.  Please call when form is ready for pick up.

## 2014-01-10 NOTE — Progress Notes (Signed)
Pt called back, would like this form to be mailed to her so she can take it to housing herself. Bonnie Peterson

## 2014-01-10 NOTE — Progress Notes (Signed)
Form placed in provider's box.  Nothing needed from clinical staff to complete. Jazmin Hartsell,CMA

## 2014-01-18 NOTE — Progress Notes (Signed)
Pt informed that form is completed. Pt request form to be mailed to home address.  Form placed in outgoing mail.  Derl Barrow, RN

## 2014-02-03 ENCOUNTER — Other Ambulatory Visit: Payer: Self-pay | Admitting: *Deleted

## 2014-02-04 MED ORDER — COLCHICINE 0.6 MG PO TABS
0.6000 mg | ORAL_TABLET | Freq: Every day | ORAL | Status: DC | PRN
Start: 2014-02-04 — End: 2014-05-24

## 2014-02-21 ENCOUNTER — Ambulatory Visit: Payer: Medicare HMO | Admitting: Family Medicine

## 2014-03-15 ENCOUNTER — Other Ambulatory Visit: Payer: Self-pay | Admitting: Family Medicine

## 2014-03-21 ENCOUNTER — Telehealth: Payer: Self-pay | Admitting: Family Medicine

## 2014-03-21 DIAGNOSIS — E785 Hyperlipidemia, unspecified: Secondary | ICD-10-CM

## 2014-03-21 DIAGNOSIS — I10 Essential (primary) hypertension: Secondary | ICD-10-CM

## 2014-03-21 DIAGNOSIS — E119 Type 2 diabetes mellitus without complications: Secondary | ICD-10-CM

## 2014-03-21 NOTE — Telephone Encounter (Signed)
Will forward to MD. Yakov Bergen,CMA  

## 2014-03-21 NOTE — Telephone Encounter (Signed)
Has appt Jan 11 at 2pm. She needs fasting blood work done and would like to come in the week before the appt and have it done. Please advise

## 2014-04-05 NOTE — Telephone Encounter (Signed)
LMOVM for pt to return call.  Please schedule lab visit when call is returned. Bonnie Peterson, Salome Spotted

## 2014-04-05 NOTE — Telephone Encounter (Signed)
Future orders placed. She may come in for a nurse's appointment the week before if she would like. Thanks  CGM

## 2014-04-05 NOTE — Telephone Encounter (Signed)
Lab appt made 04/14/14. Jazmin Hartsell,CMA

## 2014-04-14 ENCOUNTER — Other Ambulatory Visit (INDEPENDENT_AMBULATORY_CARE_PROVIDER_SITE_OTHER): Payer: Medicare HMO

## 2014-04-14 DIAGNOSIS — E119 Type 2 diabetes mellitus without complications: Secondary | ICD-10-CM | POA: Diagnosis not present

## 2014-04-14 DIAGNOSIS — E785 Hyperlipidemia, unspecified: Secondary | ICD-10-CM

## 2014-04-14 DIAGNOSIS — I10 Essential (primary) hypertension: Secondary | ICD-10-CM

## 2014-04-14 LAB — BASIC METABOLIC PANEL
BUN: 20 mg/dL (ref 6–23)
CO2: 27 mEq/L (ref 19–32)
Calcium: 9.7 mg/dL (ref 8.4–10.5)
Chloride: 99 mEq/L (ref 96–112)
Creat: 0.68 mg/dL (ref 0.50–1.10)
Glucose, Bld: 177 mg/dL — ABNORMAL HIGH (ref 70–99)
Potassium: 4.3 mEq/L (ref 3.5–5.3)
Sodium: 137 mEq/L (ref 135–145)

## 2014-04-14 LAB — POCT GLYCOSYLATED HEMOGLOBIN (HGB A1C): Hemoglobin A1C: 7.5

## 2014-04-14 LAB — LIPID PANEL
Cholesterol: 143 mg/dL (ref 0–200)
HDL: 30 mg/dL — ABNORMAL LOW (ref >39)
LDL Cholesterol: 47 mg/dL (ref 0–99)
Total CHOL/HDL Ratio: 4.8 Ratio
Triglycerides: 329 mg/dL — ABNORMAL HIGH (ref <150)
VLDL: 66 mg/dL — ABNORMAL HIGH (ref 0–40)

## 2014-04-14 NOTE — Progress Notes (Signed)
BMP,FLP AND A1C DONE TODAY Kenyon Eshleman

## 2014-04-18 ENCOUNTER — Encounter: Payer: Self-pay | Admitting: Family Medicine

## 2014-04-18 ENCOUNTER — Ambulatory Visit (INDEPENDENT_AMBULATORY_CARE_PROVIDER_SITE_OTHER): Payer: Medicare HMO | Admitting: Family Medicine

## 2014-04-18 VITALS — BP 182/64 | HR 74 | Temp 97.7°F | Ht 63.0 in | Wt 205.2 lb

## 2014-04-18 DIAGNOSIS — R197 Diarrhea, unspecified: Secondary | ICD-10-CM

## 2014-04-18 NOTE — Progress Notes (Signed)
Patient ID: Bonnie Peterson, female   DOB: [birth year], 69 y.o.   MRN: [id number]  Pt. Informed physician that she must leave due to having diarrhea. She says that she has "too much urgency with diarrhea to wait in clinic". She says that she wanted to talk about her thyroid and about her CPAP, but that she will reschedule. On asking if she felt safe to go home currently, she says that "I feel ok, but I can't wait here". I advised her to go to the nearest ED if she began to worsen, or needed further care. She acknowledged my advice and she agreed. She stated that she would reschedule her appointment.   Paula Compton, MD Family Medicine - PGY 1

## 2014-05-12 ENCOUNTER — Encounter: Payer: Self-pay | Admitting: Family Medicine

## 2014-05-12 ENCOUNTER — Ambulatory Visit (INDEPENDENT_AMBULATORY_CARE_PROVIDER_SITE_OTHER): Payer: Medicare HMO | Admitting: Family Medicine

## 2014-05-12 VITALS — BP 162/78 | HR 75 | Temp 99.1°F | Ht 63.0 in | Wt 208.9 lb

## 2014-05-12 DIAGNOSIS — E119 Type 2 diabetes mellitus without complications: Secondary | ICD-10-CM

## 2014-05-12 DIAGNOSIS — E1169 Type 2 diabetes mellitus with other specified complication: Secondary | ICD-10-CM

## 2014-05-12 DIAGNOSIS — I10 Essential (primary) hypertension: Secondary | ICD-10-CM | POA: Diagnosis not present

## 2014-05-12 DIAGNOSIS — E669 Obesity, unspecified: Secondary | ICD-10-CM

## 2014-05-12 DIAGNOSIS — M1612 Unilateral primary osteoarthritis, left hip: Secondary | ICD-10-CM | POA: Diagnosis not present

## 2014-05-12 DIAGNOSIS — M810 Age-related osteoporosis without current pathological fracture: Secondary | ICD-10-CM

## 2014-05-12 MED ORDER — METFORMIN HCL 1000 MG PO TABS: 1000.0000 mg | ORAL_TABLET | Freq: Two times a day (BID) | ORAL | Status: DC

## 2014-05-12 MED ORDER — DICLOFENAC SODIUM 1 % TD GEL: 2.0000 g | Freq: Four times a day (QID) | TRANSDERMAL | Status: DC | PRN

## 2014-05-12 MED ORDER — ATENOLOL 50 MG PO TABS: 50.0000 mg | ORAL_TABLET | Freq: Every day | ORAL | Status: DC

## 2014-05-12 NOTE — Assessment & Plan Note (Signed)
A: Patient currently on glipizide 20 mg twice daily, and metformin 1000 mg twice daily. Unable to tolerate GI side effects of metformin at this time. Will decrease dose of metformin to 1000 in the morning and 500 and evening. Will maintain glipizide dose where it is. Instructed to take his medications with meals. Patient measuring glucose only in the morning prior to eating. Generally 130s. A1c 7.5 up from 7.3 last year.  P: - Metformin 1000 every morning 500 every afternoon - Glipizide 20 twice a day - Measure glucose every morning daily at bedtime - We will titrate metformin to GI tolerance, and consider other alternatives if unable to tolerate

## 2014-05-12 NOTE — Progress Notes (Signed)
Patient ID: Bonnie Peterson, female   DOB: January 28, 1946, 68 y.o.   MRN: [id number]   [facility] Aquilla Hacker, MD Phone: [phone]  Subjective:   # Diarrhea with Metformin usage. Type 2 diabetes -  Patient currently taking 1 thousand milligrams of metformin twice daily - Has GI distress while taking this medication, and thus has been intermittently holding medication in order to avoid episodes of diarrhea. - A1c up to 7.5 from 7.3 earlier this month.  - Otherwise says that her blood sugars have been running in the 130s in the morning. - She endorses compliance with glipizide, but says that she has been taking it only at night and right before bed which is not consistent with her prescribed dose. - No numbness, tingling, vision changes.  #Hypertension - Patient reports blood pressures in the 865H systolic at home, they have been 162/78 here. -Patient denies palpitations, vision changes, chest pain, shortness of breath. -Taking atenolol 25 mg, hydromorphone thiazide 25 mg, amlodipine 10 mg, enalapril 20 mg. -Patient endorses taking all his medications and remembered her dosages and times of when she should be taking them. -Denies dizziness, or episodes of hypotension at home.  #Arthritis -Patient with known arthritis in bilateral hips, history of pain in this area. Believed by Voltaren gel. -Desires pursuit of her Voltaren gel at this visit. -Pain well controlled, able to ambulate without difficulty, no numbness tingling paresthesias in saddle anesthesia or bowel or bladder incontinence.  #Screening healthcare - Screening mammogram to be scheduled for this year. -DEXA scan in July. -Getting retinal exam for diabetes follow up with ophthalmologist.   All relevant systems were reviewed and were negative unless otherwise noted in the HPI  Past Medical History Reviewed problem list.  Medications- reviewed and updated Current Outpatient Prescriptions    Medication Sig Dispense Refill  . ACCU-CHEK AVIVA PLUS test strip     . alendronate (FOSAMAX) 70 MG tablet     . amLODipine (NORVASC) 10 MG tablet Take 1 tablet (10 mg total) by mouth daily with breakfast. 90 tablet 3  . aspirin 81 MG chewable tablet Chew 81 mg by mouth daily.      [name] atenolol (TENORMIN) 50 MG tablet Take 1 tablet (50 mg total) by mouth daily. 90 tablet 3  . atorvastatin (LIPITOR) 40 MG tablet Take 1 tablet (40 mg total) by mouth daily with breakfast. 90 tablet 3  . baclofen (LIORESAL) 10 MG tablet Take 1 tablet (10 mg total) by mouth 3 (three) times daily as needed for muscle spasms. 90 each 11  . cephALEXin (KEFLEX) 500 MG capsule Take 1 capsule (500 mg total) by mouth 4 (four) times daily. 28 capsule 0  . cholecalciferol (VITAMIN D) 1000 UNITS tablet Take 1 tablet (1,000 Units total) by mouth daily. 30 tablet 11  . colchicine 0.6 MG tablet Take 1 tablet (0.6 mg total) by mouth daily as needed (gout flare). 30 tablet 1  . diclofenac sodium (VOLTAREN) 1 % GEL Apply 2 g topically 4 (four) times daily as needed. 100 g 4  . enalapril (VASOTEC) 20 MG tablet Take 1 tablet (20 mg total) by mouth daily with breakfast. 90 tablet 3  . fish oil-omega-3 fatty acids 1000 MG capsule Take 1 g by mouth every 30 (thirty) days.     . fluticasone (FLONASE) 50 MCG/ACT nasal spray Place 1 spray into the nose daily as needed for rhinitis.    [name] glipiZIDE (GLUCOTROL) 10 MG tablet Take 2 tablets (  20 mg total) by mouth 2 (two) times daily before a meal. 180 tablet 3  . hydrochlorothiazide (HYDRODIURIL) 25 MG tablet Take 1 tablet (25 mg total) by mouth daily with breakfast. 90 tablet 3  . Hyprom-Naphaz-Polysorb-Zn Sulf (CLEAR EYES COMPLETE OP) Place 1 drop into both eyes daily as needed (dry eyes).     [name] loratadine (CLARITIN) 10 MG tablet Take 1 tablet (10 mg total) by mouth daily as needed for allergies. 30 tablet 3  . metFORMIN (GLUCOPHAGE) 1000 MG tablet Take 1 tablet (1,000 mg total) by mouth 2 (two)  times daily with a meal. Take 500mg  each morning, 1000mg  each night 180 tablet 3  . PROAIR HFA 108 (90 BASE) MCG/ACT inhaler INHALE 2 PUFFS INTO THE LUNGS EVERY 4 HOURS AS NEEDED FOR WHEEZING OR SHORTNESS OF BREATH 1 Inhaler 0  . tiotropium (SPIRIVA) 18 MCG inhalation capsule Place 18 mcg into inhaler and inhale as needed (if its hot outside).    [name] VITAMIN E PO Take 1 tablet by mouth every 30 (thirty) days.      No current facility-administered medications for this visit.   Chief complaint-noted No additions to family history Social history- patient is a non smoker  Objective: BP 162/78 mmHg  Pulse 75  Temp(Src) 99.1 F (37.3 C) (Oral)  Ht 5\' 3"  (1.6 m)  Wt 208 lb 14.4 oz (94.756 kg)  BMI 37.01 kg/m2 Gen: NAD, alert, cooperative with exam HEENT: NCAT, EOMI, PERRL, TMs nml Neck: FROM, supple CV: RRR, good S1/S2, no murmur, cap refill <3 Resp: CTABL, no wheezes, non-labored Abd: SNTND, BS present, no guarding or organomegaly Ext: No edema, warm, normal tone, moves UE/LE spontaneously Neuro: Alert and oriented, No gross deficits Skin: no rashes no lesions  Assessment/Plan: See problem based a/p

## 2014-05-12 NOTE — Patient Instructions (Signed)
Thanks for coming in today!   You will be going down on the Metformin to 500mg  before breakfast each morning, and 1000mg  at night before dinner.  You will be taking 20mg  of Glipizide each morning before breakfast, and 20mg  each evening before dinner.   You will be going up to 1 whole pill of Atenolol each day (50mg ).  Continue to monitor your blood pressures, and bring your glucose log with your glucose checks to your next visit.   I have sent a prescription for voltaren gel to your pharmacy.   You can come in any day next week or in the future to get blood work for your thyroid. You may schedule an appointment for this on your way out.   You need to schedule your Mammogram when you return home.   You will have a Dexa scan for your bone density and we will let you know when that appointment will be.   Thanks for letting us take care of you!   Sincerely,  Paula Compton, MD Family Medicine - PGY 1

## 2014-05-12 NOTE — Assessment & Plan Note (Signed)
A: Patient with systolic blood pressures of 168-170 at home, 162/78 here. States that she takes all of her blood pressure medications daily, is able to name dosages and times of medications. Is currently taking half of her pill of atenolol. Pulse 80  P: -We will increase atenolol to 50 mg daily. -Continue Norvasc, Hytrin thiazide, enalapril in addition to atenolol. -Patient advised the risk of hypertension, dizziness, palpitations. Advised that if she experience any side effects from this increased dose that she should stop taking the medication and call the clinic for follow-up appointment or go to the nearest ED if hypotensive or otherwise symptomatic

## 2014-05-12 NOTE — Assessment & Plan Note (Signed)
A: Patient with known bilateral hip osteoarthritis, and pain daily. Previously used NSAIDs, but states that Voltaren gel works best for her. Like new prescription. Otherwise able to ambulate without difficulty.  P: Voltaren gel 4 times a day as needed

## 2014-05-13 ENCOUNTER — Other Ambulatory Visit: Payer: Self-pay | Admitting: Family Medicine

## 2014-05-13 DIAGNOSIS — Z1231 Encounter for screening mammogram for malignant neoplasm of breast: Secondary | ICD-10-CM

## 2014-05-16 ENCOUNTER — Telehealth: Payer: Self-pay | Admitting: *Deleted

## 2014-05-16 NOTE — Telephone Encounter (Signed)
Received a fax from [facility] needing clarification in the metformin directions.  Form placed in provider box for review.  Derl Barrow, RN

## 2014-05-19 ENCOUNTER — Other Ambulatory Visit: Payer: Medicare HMO

## 2014-05-19 NOTE — Progress Notes (Signed)
[facility] phlebotomist drew:  TSH

## 2014-05-19 NOTE — Telephone Encounter (Signed)
Filled out and placed in fax bin.  CGM

## 2014-05-20 LAB — TSH: TSH: 1.273 u[IU]/mL (ref 0.350–4.500)

## 2014-05-24 ENCOUNTER — Other Ambulatory Visit: Payer: Self-pay | Admitting: Family Medicine

## 2014-05-25 ENCOUNTER — Inpatient Hospital Stay: Admission: RE | Admit: 2014-05-25 | Payer: Medicare HMO | Source: Ambulatory Visit

## 2014-05-25 ENCOUNTER — Ambulatory Visit: Payer: Medicare HMO

## 2014-05-25 MED ORDER — ALENDRONATE SODIUM 70 MG PO TABS: 70.0000 mg | ORAL_TABLET | ORAL | Status: DC

## 2014-05-25 MED ORDER — GLIPIZIDE 10 MG PO TABS: 20.0000 mg | ORAL_TABLET | Freq: Two times a day (BID) | ORAL | Status: DC

## 2014-06-02 ENCOUNTER — Ambulatory Visit
Admission: RE | Admit: 2014-06-02 | Discharge: 2014-06-02 | Disposition: A | Discharge status: Home / Self Care | Payer: Commercial Managed Care - HMO | Source: Ambulatory Visit | Attending: Family Medicine | Admitting: Family Medicine

## 2014-06-02 ENCOUNTER — Other Ambulatory Visit: Payer: Self-pay | Admitting: Family Medicine

## 2014-06-02 DIAGNOSIS — Z1231 Encounter for screening mammogram for malignant neoplasm of breast: Secondary | ICD-10-CM

## 2014-06-06 ENCOUNTER — Telehealth: Payer: Self-pay | Admitting: Family Medicine

## 2014-06-06 ENCOUNTER — Encounter: Payer: Self-pay | Admitting: Family Medicine

## 2014-06-06 NOTE — Telephone Encounter (Signed)
Informed of test results. Pt. Appreciative and doing well.   CGM

## 2014-07-13 ENCOUNTER — Ambulatory Visit (INDEPENDENT_AMBULATORY_CARE_PROVIDER_SITE_OTHER): Payer: Medicare HMO | Admitting: Family Medicine

## 2014-07-13 ENCOUNTER — Encounter: Payer: Self-pay | Admitting: Family Medicine

## 2014-07-13 VITALS — BP 128/72 | HR 66 | Temp 98.3°F | Ht 63.0 in | Wt 211.0 lb

## 2014-07-13 DIAGNOSIS — E119 Type 2 diabetes mellitus without complications: Secondary | ICD-10-CM

## 2014-07-13 DIAGNOSIS — G4720 Circadian rhythm sleep disorder, unspecified type: Secondary | ICD-10-CM

## 2014-07-13 LAB — POCT GLYCOSYLATED HEMOGLOBIN (HGB A1C): Hemoglobin A1C: 7.8

## 2014-07-13 NOTE — Patient Instructions (Signed)
Thanks for coming in today.   Get your CPAP supplies and implement the sleeping changes that we discussed. Bed time at 10pm, no TV.   We will back down on your metformin to 500 twice a day and see how you are doing.   It will be important to implement the dietary changes we discussed as well as exercising more.   Thanks for letting us take care of you!   I'll see you in 2 months!   Sincerely,  Paula Compton, MD

## 2014-07-23 NOTE — Progress Notes (Signed)
I was preceptor for this office visit.  

## 2014-07-26 DIAGNOSIS — Z73810 Behavioral insomnia of childhood, sleep-onset association type: Secondary | ICD-10-CM | POA: Insufficient documentation

## 2014-07-26 NOTE — Assessment & Plan Note (Signed)
A: Pt. With decent control though A1C appears to be creeping up. She has not been able to tolerate Metformin due to GI side effects. Has limited attempt at lifestyle modification to improve glucose control. She does not want to do insulin or any other therapy with a needle. Has had diabetic retinal exam, and foot exam is benign. A1C goal is 7. Not at goal.   P:  - Metformin reduction to 500 once daily.  - Continue Glipizide for now.  - Discussed lifestyle modification with the patient and eating habits as well as exercise, will follow up on glucose control and A1C in 3 months.

## 2014-07-26 NOTE — Assessment & Plan Note (Signed)
A: Pt. With difficulty falling asleep mostly related to behavior and sleep hygeine. Also, is not compliant with CPAP at this time which is also likely contributing to poor sleep quality.   P:  - Behavior change with regard to NO TV  In the bedroom.  - Form habits surrounding sleep.  - Awake between 6-7 in the morning and do not go to sleep again until 10 at night.  - If you can't fall asleep, then leave the bedroom and return when you are sleepy again.  - Will consider adding pharmacotherapy if behavior change is ineffective at next visit and other factors have been resolved.

## 2014-07-26 NOTE — Progress Notes (Signed)
Patient ID: Bonnie Peterson, female   DOB: Aug 01, 1945, 69 y.o.   MRN: [id number]   [facility] Aquilla Hacker, MD Phone: [phone]  Subjective:   # DMII - Pt. Continues to have difficulty tolerating the metformin.  - She is having somewhat less diarrhea, but she still cannot leave her house due to having urgent diarrhea in public.  - She says that her glucose has been around the 150's at home.   - She has seen her eye doctor near the end of last year for eye screening which was negative.  - She has not had any episodes of hypoglycemia.  - Her A1C has been creeping up, and went to 7.8 from 7.5 today.  - She does not want any added control with therapeutic modalities involving needles.  - She has tried diet and exercise changes before, but says that she could use some help with this.   # Insomnia - Pt. Says that she has difficulty going to sleep at night.  - Nightly routine involves, dinner around 7, no caffeine / high sugar foods, watches tv until around 10, then she moves to the bedroom where she watches TV in bed, she then turns off her tv around 11 and can't fall asleep for several hours. She will lay in bed waiting to fall asleep and then in the early morning hours get up to play games on her computer until she is tired enough to fall asleep. She often falls asleep between 3-4 am. She will then wake up around 10-11 am the next day. She does not nap during the day.  - Known history of sleep apnea, and has not gotten her new CPAP supplies since my visit with her in September discussing this.  - She is interested in behavior change before moving to medication.   All relevant systems were reviewed and were negative unless otherwise noted in the HPI  Past Medical History Reviewed problem list.  Medications- reviewed and updated Current Outpatient Prescriptions  Medication Sig Dispense Refill  . ACCU-CHEK AVIVA PLUS test strip     . alendronate (FOSAMAX) 70 MG  tablet Take 1 tablet (70 mg total) by mouth once a week. 4 tablet 2  . amLODipine (NORVASC) 10 MG tablet Take 1 tablet (10 mg total) by mouth daily with breakfast. 90 tablet 3  . aspirin 81 MG chewable tablet Chew 81 mg by mouth daily.      [name] atenolol (TENORMIN) 50 MG tablet Take 1 tablet (50 mg total) by mouth daily. 90 tablet 3  . atorvastatin (LIPITOR) 40 MG tablet Take 1 tablet (40 mg total) by mouth daily with breakfast. 90 tablet 3  . baclofen (LIORESAL) 10 MG tablet Take 1 tablet (10 mg total) by mouth 3 (three) times daily as needed for muscle spasms. 90 each 11  . cephALEXin (KEFLEX) 500 MG capsule Take 1 capsule (500 mg total) by mouth 4 (four) times daily. 28 capsule 0  . cholecalciferol (VITAMIN D) 1000 UNITS tablet Take 1 tablet (1,000 Units total) by mouth daily. 30 tablet 11  . colchicine 0.6 MG tablet TAKE 1 TABLET DAILY AS NEEDED FOR GOUT FLARE 30 tablet 1  . diclofenac sodium (VOLTAREN) 1 % GEL Apply 2 g topically 4 (four) times daily as needed. 100 g 4  . enalapril (VASOTEC) 20 MG tablet Take 1 tablet (20 mg total) by mouth daily with breakfast. 90 tablet 3  . fish oil-omega-3 fatty acids 1000 MG capsule Take  1 g by mouth every 30 (thirty) days.     . fluticasone (FLONASE) 50 MCG/ACT nasal spray Place 1 spray into the nose daily as needed for rhinitis.    [name] glipiZIDE (GLUCOTROL) 10 MG tablet Take 2 tablets (20 mg total) by mouth 2 (two) times daily before a meal. 180 tablet 3  . hydrochlorothiazide (HYDRODIURIL) 25 MG tablet Take 1 tablet (25 mg total) by mouth daily with breakfast. 90 tablet 3  . Hyprom-Naphaz-Polysorb-Zn Sulf (CLEAR EYES COMPLETE OP) Place 1 drop into both eyes daily as needed (dry eyes).     [name] loratadine (CLARITIN) 10 MG tablet Take 1 tablet (10 mg total) by mouth daily as needed for allergies. 30 tablet 3  . metFORMIN (GLUCOPHAGE) 1000 MG tablet Take 1 tablet (1,000 mg total) by mouth 2 (two) times daily with a meal. Take 500mg  each morning, 1000mg  each night  180 tablet 3  . PROAIR HFA 108 (90 BASE) MCG/ACT inhaler INHALE 2 PUFFS INTO THE LUNGS EVERY 4 HOURS AS NEEDED FOR WHEEZING OR SHORTNESS OF BREATH 1 Inhaler 0  . tiotropium (SPIRIVA) 18 MCG inhalation capsule Place 18 mcg into inhaler and inhale as needed (if its hot outside).    [name] VITAMIN E PO Take 1 tablet by mouth every 30 (thirty) days.      No current facility-administered medications for this visit.   Chief complaint-noted No additions to family history Social history- patient is a smoker  Objective: BP 128/72 mmHg  Pulse 66  Temp(Src) 98.3 F (36.8 C) (Oral)  Ht 5\' 3"  (1.6 m)  Wt 211 lb (95.709 kg)  BMI 37.39 kg/m2 Gen: NAD, alert, cooperative with exam HEENT: NCAT, EOMI, PERRL Neck: FROM, supple CV: RRR, good S1/S2, no murmur Resp: CTABL, no wheezes, non-labored Abd: SNTND, BS present, no guarding or organomegaly Ext: No edema, warm, normal tone, moves UE/LE spontaneously Neuro: Alert and oriented, No gross deficits Skin: no rashes no lesions  Assessment/Plan: See problem based a/p

## 2014-08-02 ENCOUNTER — Other Ambulatory Visit: Payer: Self-pay | Admitting: *Deleted

## 2014-08-02 MED ORDER — ENALAPRIL MALEATE 20 MG PO TABS: 20.0000 mg | ORAL_TABLET | Freq: Every day | ORAL | Status: DC

## 2014-08-02 MED ORDER — ATORVASTATIN CALCIUM 40 MG PO TABS: 40.0000 mg | ORAL_TABLET | Freq: Every day | ORAL | Status: DC

## 2014-08-02 MED ORDER — VITAMIN D3 25 MCG (1000 UNIT) PO TABS: 1000.0000 [IU] | ORAL_TABLET | Freq: Every day | ORAL | Status: DC

## 2014-08-03 MED ORDER — AMLODIPINE BESYLATE 10 MG PO TABS: 10.0000 mg | ORAL_TABLET | Freq: Every day | ORAL | Status: DC

## 2014-08-08 ENCOUNTER — Telehealth: Payer: Self-pay

## 2014-08-08 ENCOUNTER — Other Ambulatory Visit: Payer: Self-pay

## 2014-08-08 DIAGNOSIS — D134 Benign neoplasm of liver: Secondary | ICD-10-CM

## 2014-08-08 NOTE — Telephone Encounter (Signed)
-----   Message from [name], MD sent at 08/08/2014 11:18 AM EDT ----- The lesion was seen pretty clearly by CT scan, lets order that instead.  CT scan liver, triple phase with IV contrast (this is not same contrast as is used for MRI)  Thanks  ----- Message -----    From: Greggory Keen, LPN    Sent: 10/11/1947  10:55 AM      To: [name], MD  Radiology advised a "steroid prep" where the prednisone starts 14 hours before the procedure. There is also benadryl involved. Are you familiar with this prep? ----- Message -----    From: [name], MD    Sent: 08/08/2014  10:14 AM      To: Greggory Keen, LPN  Please contact radiology and ask if they can recommend any other imaging tests for her.  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 Please review her last report. She had issue with the contrast. Do you want to do the MRI without contrast? ----- Message -----    From: Hulan Saas, RN    Sent: 08/08/2014   8:23 AM      To: Greggory Keen, LPN    ----- Message -----    From: Hulan Saas, RN    Sent: 08/08/2014      To: Hulan Saas, RN  Needs 1 year f/u MRI liver to f/u on adenoma AE. See image result note on 09/02/13.

## 2014-08-08 NOTE — Telephone Encounter (Signed)
Scheduled the patient. Contacted her about the appointment. She asks to wait until after she discusses this with her pulmonologist Dr Gwenette Greet. Her appointment is 08/22/14. She is concerned about receiving any type of contrast. She is aware it is a different contrast, but states she will discuss it with him first.

## 2014-08-09 ENCOUNTER — Telehealth: Payer: Self-pay

## 2014-08-09 NOTE — Telephone Encounter (Signed)
-----   Message from Barron Alvine, [place] sent at 09/07/2013 10:45 AM EDT ----- repeat imaging of your liver May 2016 (MRI, already in reminder system).

## 2014-08-10 NOTE — Telephone Encounter (Signed)
The pt states she has a lot of appt's and would like to call back when she is ready to set that up.  I advised pt of the importance and she states she will call back as soon as her other appt's are over.

## 2014-08-12 ENCOUNTER — Other Ambulatory Visit: Payer: Commercial Managed Care - HMO

## 2014-08-15 ENCOUNTER — Other Ambulatory Visit: Payer: Self-pay | Admitting: Family Medicine

## 2014-08-15 DIAGNOSIS — J42 Unspecified chronic bronchitis: Secondary | ICD-10-CM

## 2014-08-15 MED ORDER — ALBUTEROL SULFATE HFA 108 (90 BASE) MCG/ACT IN AERS: INHALATION_SPRAY | RESPIRATORY_TRACT | Status: DC

## 2014-08-15 MED ORDER — TIOTROPIUM BROMIDE MONOHYDRATE 18 MCG IN CAPS: 18.0000 ug | ORAL_CAPSULE | RESPIRATORY_TRACT | Status: DC | PRN

## 2014-08-15 MED ORDER — VITAMIN D3 25 MCG (1000 UNIT) PO TABS: 1000.0000 [IU] | ORAL_TABLET | Freq: Every day | ORAL | Status: DC

## 2014-08-15 NOTE — Telephone Encounter (Signed)
Refilled.   CGM, MD

## 2014-08-15 NOTE — Telephone Encounter (Signed)
Pt called and needs refills called in for Vitamin D, Flexeril, and inhaler. jw

## 2014-08-16 ENCOUNTER — Telehealth: Payer: Self-pay | Admitting: *Deleted

## 2014-08-16 NOTE — Telephone Encounter (Signed)
Received a fax from [facility] needing provider to clarify Spiriva Handihaler frequency in directions.  Fax placed in provider box to complete and fax back to [facility].  Derl Barrow, RN

## 2014-08-19 NOTE — Telephone Encounter (Signed)
Filled out and placed in fax box.   CGM, MD

## 2014-08-22 ENCOUNTER — Encounter: Payer: Self-pay | Admitting: Pulmonary Disease

## 2014-08-22 ENCOUNTER — Ambulatory Visit (INDEPENDENT_AMBULATORY_CARE_PROVIDER_SITE_OTHER): Payer: Medicare HMO | Admitting: Pulmonary Disease

## 2014-08-22 VITALS — BP 122/50 | HR 64 | Temp 98.2°F | Ht 63.0 in | Wt 208.4 lb

## 2014-08-22 DIAGNOSIS — G4733 Obstructive sleep apnea (adult) (pediatric): Secondary | ICD-10-CM

## 2014-08-22 DIAGNOSIS — Z72821 Inadequate sleep hygiene: Secondary | ICD-10-CM | POA: Diagnosis not present

## 2014-08-22 NOTE — Assessment & Plan Note (Signed)
The patient has issues getting back to sleep once she awakens during the middle of the night, however, she has very poor sleep hygiene.  She will often get on her computer for hours at a time during the middle of the night, or will stay in bed and toss and turn. She also is not adequately treating her sleep apnea at this time. Finally, she will doze all throughout the day with inactivity, and this can disrupt her sleep as well. I have reviewed good sleep hygiene with her in detail, and also stressed the importance of staying awake throughout the day. She should also avoid using a computer after 10 PM. Hopefully, once we get her back on C Pap and she sticks to good sleep hygiene, she will see her sleep issues improved.

## 2014-08-22 NOTE — Progress Notes (Signed)
Subjective:    Patient ID: Bonnie Peterson, female    DOB: 1945/08/01, 69 y.o.   MRN: [id number]  HPI The patient is a 69 year old female who I've been asked to see for management of sleep apnea and other issues. The patient was diagnosed with mild to moderate OSA in 2010, with an AHI of 15 events per hour. She was started on C Pap, and was compliant with the device until recently. I have not seen her since 2011. Her current insurance no longer works with her home care company, and she is overdue for a C Pap mask and supplies. She is having issues with sleep maintenance during the night, probably because of her sleep apnea, but it is taking about 3-4 hours to get back to sleep. Also me that she typically gets on a computer all during the night, and is not able to get back to sleep. She will typically awaken between 8 and 11 AM to start her day, and will doze anytime she sits down to do quiet things. He denies taking any formal nap. The patient feels that her C Pap has greatly helped her.   Sleep Questionnaire What time do you typically go to bed?( Between what hours) has insomnia - tries to go to bed at 9am has insomnia - tries to go to bed at 9am at 1345 on 08/22/14 by Glean Hess, CMA How long does it take you to fall asleep? 10 minutes 10 minutes at 1345 on 08/22/14 by Glean Hess, CMA How many times during the night do you wake up? 4 4 at 1345 on 08/22/14 by Glean Hess, CMA What time do you get out of bed to start your day? 1100 1100 at 1345 on 08/22/14 by Glean Hess, CMA Do you drive or operate heavy machinery in your occupation? No No at 1345 on 08/22/14 by Glean Hess, CMA How much has your weight changed (up or down) over the past two years? (In pounds) 10 lb (4.536 kg) 10 lb (4.536 kg) at 1345 on 08/22/14 by Glean Hess, CMA Have you ever had a sleep study before? Yes Yes at 1345 on 08/22/14 by Glean Hess, CMA If yes, location of study? [place] at 2202 on 08/22/14 by Glean Hess, CMA If yes, date of study? Do you currently use CPAP? Yes Yes at 1345 on 08/22/14 by Glean Hess, CMA If so, what pressure? Do you wear oxygen at any time? No No at 1345 on 08/22/14 by Glean Hess, CMA   Review of Systems  Constitutional: Negative for fever and unexpected weight change.  HENT: Negative for congestion, dental problem, ear pain, nosebleeds, postnasal drip, rhinorrhea, sinus pressure, sneezing, sore throat and trouble swallowing.   Eyes: Negative for redness and itching.  Respiratory: Negative for cough, chest tightness, shortness of breath and wheezing.   Cardiovascular: Negative for palpitations and leg swelling.  Gastrointestinal: Negative for nausea and vomiting.  Genitourinary: Negative for dysuria.  Musculoskeletal: Negative for joint swelling.  Skin: Negative for rash.  Neurological: Negative for headaches.  Hematological: Does not bruise/bleed easily.  Psychiatric/Behavioral: Negative for dysphoric mood. The patient is not nervous/anxious.        Objective:   Physical Exam Constitutional:  Obese female, no acute distress  HENT:  Nares patent without discharge  Oropharynx without exudate, palate and uvula are thick and elongated  Eyes:  Perrla, eomi, no scleral icterus  Neck:  No  JVD, no TMG  Cardiovascular:  Normal rate, regular rhythm, no rubs or gallops.  2/6 sem        Intact distal pulses  Pulmonary :  Normal breath sounds, no stridor or respiratory distress   No rales, rhonchi, or wheezing  Abdominal:  Soft, nondistended, bowel sounds present.  No tenderness noted.   Musculoskeletal:  No lower extremity edema noted.  Lymph Nodes:  No cervical lymphadenopathy noted  Skin:  No cyanosis noted  Neurologic:  Alert, appropriate, moves all 4 extremities without obvious deficit.         Assessment & Plan:

## 2014-08-22 NOTE — Patient Instructions (Signed)
Will get you to a homecare company that works with your insurance.  Will get new supplies. Do not read or watch tv in bed.  No computer after 10pm each night.  If you cannot fall back asleep in 31min, leave bedroom and go to family room to read or watch tv.  Once sleepy again, try to go back to bedroom to sleep Try taking melatonin 3mg  about 3 hrs before bedtime. Start each day at 8am, and no napping or dozing during the day. Please call me in about 3 weeks with how things are going.

## 2014-08-22 NOTE — Assessment & Plan Note (Signed)
The patient has a history of mild to moderate sleep apnea, and has been on C Pap since her original diagnosis. She has not been on the device recently because of a lack of supplies, and her current home care company does not take her new insurance. We will get her referred to a new home care company, and get her back on C Pap. Also encouraged her to work aggressively on weight loss.

## 2014-08-29 ENCOUNTER — Other Ambulatory Visit: Payer: Self-pay | Admitting: Family Medicine

## 2014-08-29 ENCOUNTER — Telehealth: Payer: Self-pay | Admitting: Gastroenterology

## 2014-08-29 DIAGNOSIS — D134 Benign neoplasm of liver: Secondary | ICD-10-CM

## 2014-08-29 NOTE — Telephone Encounter (Signed)
Pt would like to set up her MRI at this time, I will call her back as soon as scheduled.

## 2014-08-30 ENCOUNTER — Other Ambulatory Visit: Payer: Self-pay | Admitting: *Deleted

## 2014-08-30 MED ORDER — BACLOFEN 10 MG PO TABS: 10.0000 mg | ORAL_TABLET | Freq: Three times a day (TID) | ORAL | Status: DC | PRN

## 2014-09-01 NOTE — Telephone Encounter (Signed)
The patient has been notified of this information and all questions answered.

## 2014-09-01 NOTE — Telephone Encounter (Signed)
You have been scheduled for an MRI at [facility] on 09/15/14 . Your appointment time is 3 pm. Please arrive 15 minutes prior to your appointment time for registration purposes. Please make certain not to have anything to eat or drink 6 hours prior to your test. In addition, if you have any metal in your body, have a pacemaker or defibrillator, please be sure to let your ordering physician know. This test typically takes 45 minutes to 1 hour to complete.  Pt also needs to come in for labs prior, order in EPIC   Left message on machine to call back

## 2014-09-02 NOTE — Telephone Encounter (Signed)
Pt confirmed she is aware of the MRI

## 2014-09-12 ENCOUNTER — Other Ambulatory Visit (INDEPENDENT_AMBULATORY_CARE_PROVIDER_SITE_OTHER): Payer: Medicare HMO

## 2014-09-12 DIAGNOSIS — D134 Benign neoplasm of liver: Secondary | ICD-10-CM | POA: Diagnosis not present

## 2014-09-12 LAB — BUN: BUN: 11 mg/dL (ref 6–23)

## 2014-09-12 LAB — CREATININE, SERUM: Creatinine, Ser: 0.74 mg/dL (ref 0.40–1.20)

## 2014-09-15 ENCOUNTER — Ambulatory Visit (HOSPITAL_COMMUNITY)
Admission: RE | Admit: 2014-09-15 | Discharge: 2014-09-15 | Disposition: A | Discharge status: Home / Self Care | Payer: Medicare HMO | Source: Ambulatory Visit | Attending: Gastroenterology | Admitting: Gastroenterology

## 2014-09-15 DIAGNOSIS — D134 Benign neoplasm of liver: Secondary | ICD-10-CM | POA: Diagnosis not present

## 2014-09-15 DIAGNOSIS — K760 Fatty (change of) liver, not elsewhere classified: Secondary | ICD-10-CM | POA: Diagnosis not present

## 2014-09-15 MED ORDER — GADOXETATE DISODIUM 0.25 MMOL/ML IV SOLN
8.0000 mL | Freq: Once | INTRAVENOUS | Status: AC | PRN
  Administered 2014-09-15: 10 mL via INTRAVENOUS

## 2014-09-21 ENCOUNTER — Telehealth: Payer: Self-pay | Admitting: Gastroenterology

## 2014-09-21 DIAGNOSIS — K838 Other specified diseases of biliary tract: Secondary | ICD-10-CM

## 2014-09-21 DIAGNOSIS — R160 Hepatomegaly, not elsewhere classified: Secondary | ICD-10-CM

## 2014-09-21 NOTE — Telephone Encounter (Signed)
Spoke with Bonnie Peterson and she says the pt will call the pt in 1-2 days with appt

## 2014-09-21 NOTE — Telephone Encounter (Signed)
GI Tumor Board this morning. Consensus that the lesion in liver has clearly grown.  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 After discussion, we agreed that repeat biopsy is in order.  I spoke with her about that and she is agreeable.  Bonnie Peterson, Can you set up IR biopsy of liver, bile duct mass.  She prefers to wait about 2 weeks to do it, which is fine.  Thanks

## 2014-10-02 ENCOUNTER — Other Ambulatory Visit: Payer: Self-pay | Admitting: Radiology

## 2014-10-03 ENCOUNTER — Other Ambulatory Visit: Payer: Self-pay | Admitting: Radiology

## 2014-10-04 ENCOUNTER — Ambulatory Visit (HOSPITAL_COMMUNITY)
Admission: RE | Admit: 2014-10-04 | Discharge: 2014-10-04 | Disposition: A | Discharge status: Home / Self Care | Payer: Medicare HMO | Source: Ambulatory Visit | Attending: Gastroenterology | Admitting: Gastroenterology

## 2014-10-04 ENCOUNTER — Encounter (HOSPITAL_COMMUNITY): Payer: Self-pay

## 2014-10-04 DIAGNOSIS — K769 Liver disease, unspecified: Secondary | ICD-10-CM | POA: Insufficient documentation

## 2014-10-04 DIAGNOSIS — J449 Chronic obstructive pulmonary disease, unspecified: Secondary | ICD-10-CM | POA: Insufficient documentation

## 2014-10-04 DIAGNOSIS — Z8249 Family history of ischemic heart disease and other diseases of the circulatory system: Secondary | ICD-10-CM | POA: Insufficient documentation

## 2014-10-04 DIAGNOSIS — Z833 Family history of diabetes mellitus: Secondary | ICD-10-CM | POA: Diagnosis not present

## 2014-10-04 DIAGNOSIS — Z7983 Long term (current) use of bisphosphonates: Secondary | ICD-10-CM | POA: Diagnosis not present

## 2014-10-04 DIAGNOSIS — J984 Other disorders of lung: Secondary | ICD-10-CM | POA: Insufficient documentation

## 2014-10-04 DIAGNOSIS — K838 Other specified diseases of biliary tract: Secondary | ICD-10-CM

## 2014-10-04 DIAGNOSIS — I10 Essential (primary) hypertension: Secondary | ICD-10-CM | POA: Insufficient documentation

## 2014-10-04 DIAGNOSIS — E785 Hyperlipidemia, unspecified: Secondary | ICD-10-CM | POA: Diagnosis not present

## 2014-10-04 DIAGNOSIS — F17210 Nicotine dependence, cigarettes, uncomplicated: Secondary | ICD-10-CM | POA: Diagnosis not present

## 2014-10-04 DIAGNOSIS — G4733 Obstructive sleep apnea (adult) (pediatric): Secondary | ICD-10-CM | POA: Insufficient documentation

## 2014-10-04 DIAGNOSIS — R160 Hepatomegaly, not elsewhere classified: Secondary | ICD-10-CM

## 2014-10-04 DIAGNOSIS — K589 Irritable bowel syndrome without diarrhea: Secondary | ICD-10-CM | POA: Insufficient documentation

## 2014-10-04 DIAGNOSIS — E669 Obesity, unspecified: Secondary | ICD-10-CM | POA: Diagnosis not present

## 2014-10-04 DIAGNOSIS — Z79899 Other long term (current) drug therapy: Secondary | ICD-10-CM | POA: Insufficient documentation

## 2014-10-04 LAB — GLUCOSE, CAPILLARY: Glucose-Capillary: 150 mg/dL — ABNORMAL HIGH (ref 65–99)

## 2014-10-04 LAB — APTT: aPTT: 33 seconds (ref 24–37)

## 2014-10-04 LAB — CBC
HCT: 44 % (ref 36.0–46.0)
Hemoglobin: 14 g/dL (ref 12.0–15.0)
MCH: 28.5 pg (ref 26.0–34.0)
MCHC: 31.8 g/dL (ref 30.0–36.0)
MCV: 89.4 fL (ref 78.0–100.0)
Platelets: 209 10*3/uL (ref 150–400)
RBC: 4.92 MIL/uL (ref 3.87–5.11)
RDW: 13.3 % (ref 11.5–15.5)
WBC: 10.1 10*3/uL (ref 4.0–10.5)

## 2014-10-04 LAB — PROTIME-INR
INR: 0.96 (ref 0.00–1.49)
Prothrombin Time: 13 seconds (ref 11.6–15.2)

## 2014-10-04 MED ORDER — MIDAZOLAM HCL 2 MG/2ML IJ SOLN
INTRAMUSCULAR | Status: AC
  Filled 2014-10-04: qty 4

## 2014-10-04 MED ORDER — FENTANYL CITRATE (PF) 100 MCG/2ML IJ SOLN
INTRAMUSCULAR | Status: AC
  Filled 2014-10-04: qty 2

## 2014-10-04 MED ORDER — MIDAZOLAM HCL 2 MG/2ML IJ SOLN
INTRAMUSCULAR | Status: AC | PRN
  Administered 2014-10-04: 1 mg via INTRAVENOUS
  Administered 2014-10-04: 0.5 mg via INTRAVENOUS

## 2014-10-04 MED ORDER — FLUMAZENIL 0.5 MG/5ML IV SOLN
INTRAVENOUS | Status: AC
  Filled 2014-10-04: qty 5

## 2014-10-04 MED ORDER — NALOXONE HCL 0.4 MG/ML IJ SOLN
INTRAMUSCULAR | Status: AC
  Filled 2014-10-04: qty 1

## 2014-10-04 MED ORDER — FENTANYL CITRATE (PF) 100 MCG/2ML IJ SOLN
INTRAMUSCULAR | Status: AC | PRN
  Administered 2014-10-04: 50 ug via INTRAVENOUS
  Administered 2014-10-04: 25 ug via INTRAVENOUS

## 2014-10-04 MED ORDER — OXYCODONE HCL 5 MG PO TABS
5.0000 mg | ORAL_TABLET | ORAL | Status: DC | PRN
  Filled 2014-10-04: qty 1

## 2014-10-04 MED ORDER — SODIUM CHLORIDE 0.9 % IV SOLN
Freq: Once | INTRAVENOUS | Status: AC
  Administered 2014-10-04: 11:00:00 via INTRAVENOUS

## 2014-10-04 NOTE — Procedures (Signed)
US guided biopsy of liver lesion.  17 gauge needle placed in lesion and performed 6 core biopsies with 18 gauge device but no samples obtained.  Therefore, 4 FNAs obtained.  No immediate complication.  Minimal blood loss.

## 2014-10-04 NOTE — Discharge Instructions (Signed)
Liver Biopsy, Care After °Refer to this sheet in the next few weeks. These instructions provide you with information on caring for yourself after your procedure. Your health care provider may also give you more specific instructions. Your treatment has been planned according to current medical practices, but problems sometimes occur. Call your health care provider if you have any problems or questions after your procedure. °WHAT TO EXPECT AFTER THE PROCEDURE °After your procedure, it is typical to have the following: °· A small amount of discomfort in the area where the biopsy was done and in the right shoulder or shoulder blade. °· A small amount of bruising around the area where the biopsy was done and on the skin over the liver. °· Sleepiness and fatigue for the rest of the day. °HOME CARE INSTRUCTIONS  °· Rest at home for 1-2 days or as directed by your health care provider. °· Have a friend or family member stay with you for at least 24 hours. °· Because of the medicines used during the procedure, you should not do the following things in the first 24 hours: °¨ Drive. °¨ Use machinery. °¨ Be responsible for the care of other people. °¨ Sign legal documents. °¨ Take a bath or shower. °· There are many different ways to close and cover an incision, including stitches, skin glue, and adhesive strips. Follow your health care provider's instructions on: °¨ Incision care. °¨ Bandage (dressing) changes and removal. °¨ Incision closure removal. °· Do not drink alcohol in the first week. °· Do not lift more than 5 pounds or play contact sports for 2 weeks after this test. °· Take medicines only as directed by your health care provider. Do not take medicine containing aspirin or non-steroidal anti-inflammatory medicines such as ibuprofen for 1 week after this test. °· It is your responsibility to get your test results. °SEEK MEDICAL CARE IF:  °· You have increased bleeding from an incision that results in more than a  small spot of blood. °· You have redness, swelling, or increasing pain in any incisions. °· You notice a discharge or a bad smell coming from any of your incisions. °· You have a fever or chills. °SEEK IMMEDIATE MEDICAL CARE IF:  °· You develop swelling, bloating, or pain in your abdomen. °· You become dizzy or faint. °· You develop a rash. °· You are nauseous or vomit. °· You have difficulty breathing, feel short of breath, or feel faint. °· You develop chest pain. °· You have problems with your speech or vision. °· You have trouble balancing or moving your arms or legs. °Document Released: 10/12/2004 Document Revised: 08/09/2013 Document Reviewed: 05/21/2013 °[employer]® Patient Information ©2015 [employer]. This information is not intended to replace advice given to you by your health care provider. Make sure you discuss any questions you have with your health care provider. °Conscious Sedation, Adult, Care After °Refer to this sheet in the next few weeks. These instructions provide you with information on caring for yourself after your procedure. Your health care provider may also give you more specific instructions. Your treatment has been planned according to current medical practices, but problems sometimes occur. Call your health care provider if you have any problems or questions after your procedure. °WHAT TO EXPECT AFTER THE PROCEDURE  °After your procedure: °· You may feel sleepy, clumsy, and have poor balance for several hours. °· Vomiting may occur if you eat too soon after the procedure. °HOME CARE INSTRUCTIONS °· Do not participate   in any activities where you could become injured for at least 24 hours. Do not: °¨ Drive. °¨ Swim. °¨ Ride a bicycle. °¨ Operate heavy machinery. °¨ Cook. °¨ Use power tools. °¨ Climb ladders. °¨ Work from a high place. °· Do not make important decisions or sign legal documents until you are improved. °· If you vomit, drink water, juice, or soup when you can drink without  vomiting. Make sure you have little or no nausea before eating solid foods. °· Only take over-the-counter or prescription medicines for pain, discomfort, or fever as directed by your health care provider. °· Make sure you and your family fully understand everything about the medicines given to you, including what side effects may occur. °· You should not drink alcohol, take sleeping pills, or take medicines that cause drowsiness for at least 24 hours. °· If you smoke, do not smoke without supervision. °· If you are feeling better, you may resume normal activities 24 hours after you were sedated. °· Keep all appointments with your health care provider. °SEEK MEDICAL CARE IF: °· Your skin is pale or bluish in color. °· You continue to feel nauseous or vomit. °· Your pain is getting worse and is not helped by medicine. °· You have bleeding or swelling. °· You are still sleepy or feeling clumsy after 24 hours. °SEEK IMMEDIATE MEDICAL CARE IF: °· You develop a rash. °· You have difficulty breathing. °· You develop any type of allergic problem. °· You have a fever. °MAKE SURE YOU: °· Understand these instructions. °· Will watch your condition. °· Will get help right away if you are not doing well or get worse. °Document Released: 01/13/2013 Document Reviewed: 01/13/2013 °[employer]® Patient Information ©2015 [employer]. This information is not intended to replace advice given to you by your health care provider. Make sure you discuss any questions you have with your health care provider. ° °

## 2014-10-04 NOTE — H&P (Signed)
Chief Complaint: "I'm here for another liver biopsy"  Referring Physician(s): Jacobs,Daniel P  History of Present Illness: Bonnie Peterson is a 69 y.o. 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    Past Medical History  Diagnosis Date  . Obstructive sleep apnea   . HTN, goal below 130/80   . HLD (hyperlipidemia)   . Benign positional vertigo   . Depression   . Anxiety   . Osteoarthritis (arthritis due to wear and tear of joints)   . Irritable bowel syndrome   . Obesity, Class III, BMI 40-49.9 (morbid obesity)   . Gout   . Restrictive lung disease   . Echocardiogram findings abnormal, without diagnosis 10/10    10/10: mild pulm HTN, EF 60-65%, mild LVH, moderate aortic regurg  . Early cataracts, bilateral 10/13    Optho, Dr Gershon Crane  . COPD (chronic obstructive pulmonary disease)     Past Surgical History  Procedure Laterality Date  . Partial hysterectomy    . Rotator cuff repair        L rotator cuff repair 11/07-[facility] - 12/7/200  . Tonsillectomy    . Liver biopsy      Allergies: Other  Medications: Prior to Admission medications   Medication Sig Start Date End Date Taking? Authorizing Provider  albuterol (PROAIR HFA) 108 (90 BASE) MCG/ACT inhaler INHALE 2 PUFFS INTO THE LUNGS EVERY 4 HOURS AS NEEDED FOR WHEEZING OR SHORTNESS OF BREATH 08/15/14  Yes Aquilla Hacker, MD  alendronate (FOSAMAX) 70 MG tablet TAKE 1 TABLET  ONCE A WEEK. Patient taking differently: TAKE 1 TABLET  ONCE A WEEK.--Sunday 08/29/14  Yes Aquilla Hacker, MD  amLODipine (NORVASC) 10 MG tablet Take 1 tablet (10 mg total) by mouth daily with breakfast. 08/03/14  Yes Katheren Shams, DO  aspirin 81 MG chewable tablet Chew 81 mg by mouth daily.     Yes Historical Provider, MD  atenolol (TENORMIN) 50 MG tablet Take 1 tablet (50 mg total) by mouth daily. 05/12/14   Yes Aquilla Hacker, MD  atorvastatin (LIPITOR) 40 MG tablet Take 1 tablet (40 mg total) by mouth daily with breakfast. 08/02/14  Yes Katheren Shams, DO  baclofen (LIORESAL) 10 MG tablet Take 1 tablet (10 mg total) by mouth 3 (three) times daily as needed for muscle spasms. 08/30/14  Yes Aquilla Hacker, MD  cholecalciferol (VITAMIN D) 1000 UNITS tablet Take 1 tablet (1,000 Units total) by mouth daily. 08/15/14  Yes York Ram Melancon, MD  colchicine 0.6 MG tablet TAKE 1 TABLET DAILY AS NEEDED FOR GOUT FLARE 05/25/14  Yes Aquilla Hacker, MD  diclofenac sodium (VOLTAREN) 1 % GEL Apply 2 g topically 4 (four) times daily as needed. Patient taking differently: Apply 2 g topically 4 (four) times daily as needed (knee pain, elbow and hands).  05/12/14  Yes Aquilla Hacker, MD  enalapril (VASOTEC) 20 MG tablet Take 1 tablet (20 mg total) by mouth daily with breakfast. 08/02/14  Yes Katheren Shams, DO  fish oil-omega-3 fatty acids 1000 MG capsule Take 1 g by mouth every 30 (thirty) days.    Yes Historical Provider, MD  glipiZIDE (GLUCOTROL) 10 MG tablet Take 2 tablets (20 mg total) by mouth 2 (two) times daily before a meal. 05/25/14  Yes Aquilla Hacker, MD  hydrochlorothiazide (HYDRODIURIL) 25 MG tablet Take  1 tablet (25 mg total) by mouth daily with breakfast. 06/14/13  Yes Waldemar Dickens, MD  Hyprom-Naphaz-Polysorb-Zn Sulf (CLEAR EYES COMPLETE OP) Place 1 drop into both eyes daily as needed (dry eyes).    Yes Historical Provider, MD  loratadine (CLARITIN) 10 MG tablet Take 1 tablet (10 mg total) by mouth daily as needed for allergies. 09/23/12  Yes Waldemar Dickens, MD  metFORMIN (GLUCOPHAGE) 1000 MG tablet Take 1 tablet (1,000 mg total) by mouth 2 (two) times daily with a meal. Take 500mg  each morning, 1000mg  each night 05/12/14  Yes Aquilla Hacker, MD  tiotropium (SPIRIVA) 18 MCG inhalation capsule Place 1 capsule (18 mcg total) into inhaler and inhale as needed (if its hot outside). 08/15/14  Yes Aquilla Hacker, MD  VITAMIN E PO Take 1 tablet by mouth every 30 (thirty) days.    Yes Historical Provider, MD  ACCU-CHEK AVIVA PLUS test strip 1 each by Other route daily.  03/15/14   Historical Provider, MD  fluticasone (FLONASE) 50 MCG/ACT nasal spray Place 1 spray into the nose daily as needed for rhinitis. 03/17/12   Waldemar Dickens, MD     Family History  Problem Relation Age of Onset  . Breast cancer Mother   . Hypertension Mother   . Coronary artery disease Mother   . Diabetes type II Sister   . Pancreatic cancer Sister   . Diabetes type II Mother     History   Social History  . Marital Status: Single    Spouse Name: N/A  . Number of Children: 4  . Years of Education: N/A   Occupational History  . unemployed    Social History Main Topics  . Smoking status: Current Every Day Smoker -- 0.50 packs/day for 40 years    Types: Cigarettes  . Smokeless tobacco: Never Used  . Alcohol Use: Yes     Comment: occasionally  . Drug Use: No  . Sexual Activity: Not on file   Other Topics Concern  . None   Social History Narrative     Review of Systems   Constitutional: Negative for fever and chills.  Respiratory: Positive for cough and shortness of breath.   Cardiovascular: Negative for chest pain.  Gastrointestinal: Positive for abdominal pain. Negative for nausea, vomiting and blood in stool.  Genitourinary: Negative for dysuria and hematuria.  Musculoskeletal: Negative for back pain.  Neurological: Negative for headaches.    Vital Signs: BP 148/63 mmHg  Pulse 68  Temp(Src) 97.8 F (36.6 C) (Oral)  Resp 18  SpO2 95%  Physical Exam  Constitutional: She is oriented to person, place, and time. She appears well-developed and well-nourished.  Cardiovascular: Normal rate and regular rhythm.   Pulmonary/Chest: Effort normal.  Distant BS bilat  Abdominal: Soft. Bowel sounds are normal.  Obese; mild epigastric tenderness to palpation  Musculoskeletal: Normal range of  motion. She exhibits no edema.  Neurological: She is alert and oriented to person, place, and time.    Mallampati Score:     Imaging: Mr Liver W Wo Contrast  09/15/2014   CLINICAL DATA:  Followup left hepatic lobe lesion. 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  EXAM: MRI ABDOMEN WITHOUT AND WITH CONTRAST  TECHNIQUE: Multiplanar multisequence MR imaging of the abdomen was performed both before and after the administration of intravenous contrast.  CONTRAST:  8 mL Eovist  COMPARISON:  MRI 02/17/2013, 09/02/2013  FINDINGS: Lower chest: Lung bases are clear.  Hepatobiliary: Lesion in the left hepatic lobe (segment 4A) is increased in volume compared to most recent MRI. Lesion measures 29 x 34 mm on T1 series 800 compared to 21 x 20 mm on same series remeasured. The lesion demonstrates peripheral rim pseudo enhancement and mild internal enhancement on the delayed series 803. The lesion does not accumulate hepatocytes specific imaging agent as expected. Series 17).  Lesion has a more lobular contour (series 17) than on previous exams. There no additional hepatic lesions present.  Underlying hepatic steatosis.  Pancreas: Pancreas normal signal intensity. No enhancing lesions. No duct dilatation.  Spleen: Normal spleen  Adrenals/urinary tract: Adrenal glands and kidneys are normal. The ureters and bladder normal.  Stomach/Bowel: Stomach, small bowel, appendix, and cecum are normal. The colon and rectosigmoid colon are normal.  Vascular/Lymphatic: Abdominal aorta is normal caliber. There is no retroperitoneal or periportal lymphadenopathy. No pelvic lymphadenopathy.  Musculoskeletal: No aggressive osseous lesion.  Other: No free fluid.  IMPRESSION: 1. Interval enlargement of the left hepatic lobe lesion now with a more lobular contour and persistent mild enhancement. Consider a second biopsy of this enlarging lesion versus continued follow-up. Biopsy on 03/17/2013 showed a relatively rare  benign neoplasm (bile duct adenoma). 2.  Hepatic steatosis   Electronically Signed   By: Suzy Bouchard M.D.   On: 09/15/2014 17:00    Labs:  CBC:  Recent Labs  10/09/13 1941 10/04/14 1125  WBC 10.8* 10.1  HGB 14.0 14.0  HCT 42.4 44.0  PLT 182 209    COAGS: No results for input(s): INR, APTT in the last 8760 hours.  BMP:  Recent Labs  10/09/13 1941 10/09/13 2217 11/15/13 1533 04/14/14 0840 09/12/14 1218  NA 134*  --  142 137  --   K 5.6* 4.2 3.8 4.3  --   CL 93*  --  103 99  --   CO2 25  --  27 27  --   GLUCOSE 181*  --  68* 177*  --   BUN 15  --  14 20 11   CALCIUM 9.7  --  9.9 9.7  --   CREATININE 0.87  --  0.60 0.68 0.74  GFRNONAA 67*  --   --   --   --   GFRAA 78*  --   --   --   --     LIVER FUNCTION TESTS: No results for input(s): BILITOT, AST, ALT, ALKPHOS, PROT, ALBUMIN in the last 8760 hours.  TUMOR MARKERS: No results for input(s): AFPTM, CEA, CA199, CHROMGRNA in the last 8760 hours.  Assessment and Plan: Bonnie Peterson is a 70 y.o. 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    Signed: D. Rowe Robert 10/04/2014, 11:43 AM   I spent a total of 20 minutes  in face to face in clinical consultation, greater than 50% of which was counseling/coordinating care for US guided liver lesion biopsy

## 2014-10-06 ENCOUNTER — Telehealth: Payer: Self-pay | Admitting: Gastroenterology

## 2014-10-06 NOTE — Telephone Encounter (Signed)
Pt has been notified that Dr [name] is out of the office until Tuesday, we will call when he returns.

## 2014-10-11 ENCOUNTER — Other Ambulatory Visit: Payer: Self-pay | Admitting: Family Medicine

## 2014-11-16 DIAGNOSIS — D134 Benign neoplasm of liver: Secondary | ICD-10-CM | POA: Insufficient documentation

## 2014-11-22 ENCOUNTER — Telehealth: Payer: Self-pay | Admitting: Gastroenterology

## 2014-11-22 NOTE — Telephone Encounter (Signed)
Office note at [facility] 11/2014: they recommended left lobe liver resection

## 2014-11-23 ENCOUNTER — Encounter: Payer: Self-pay | Admitting: Family Medicine

## 2014-11-23 ENCOUNTER — Ambulatory Visit (INDEPENDENT_AMBULATORY_CARE_PROVIDER_SITE_OTHER): Payer: Medicare HMO | Admitting: Family Medicine

## 2014-11-23 VITALS — BP 181/95 | HR 74 | Temp 98.1°F | Ht 63.0 in | Wt 197.0 lb

## 2014-11-23 DIAGNOSIS — Z09 Encounter for follow-up examination after completed treatment for conditions other than malignant neoplasm: Secondary | ICD-10-CM

## 2014-11-23 DIAGNOSIS — R05 Cough: Secondary | ICD-10-CM

## 2014-11-23 DIAGNOSIS — R053 Chronic cough: Secondary | ICD-10-CM

## 2014-11-23 MED ORDER — BENZONATATE 100 MG PO CAPS: 100.0000 mg | ORAL_CAPSULE | Freq: Three times a day (TID) | ORAL | Status: DC | PRN

## 2014-11-23 NOTE — Patient Instructions (Signed)
Thanks for coming in today.   I have sent in a prescription for tessalon perles in order to help with your cough symptoms.   Please be sure to have the [facility] office fax [place] any records of office visits.   Additionally, as we discussed, it appears that there are some abnormalities on the CT scan ordered at [facility]. Be sure to call the physician who ordered the scan to talk about these results and to discuss next steps.   Let us know if there is anything else that we can do for you.   Thanks for letting us take care of you.   Sincerely,  Paula Compton, MD Family Medicine - PGY 2

## 2014-11-24 NOTE — Progress Notes (Signed)
Patient ID: Bonnie Peterson, female   DOB: [birth year], 69 y.o.   MRN: [id number]   [facility] Aquilla Hacker, MD Phone: [phone]  Subjective:   # Follow Up  - Pt. Here for follow up.  - In the Interim she had a follow up MRI for a bile duct adenoma one year ago and liver lesion in the left lobe. The MRI demonstrated enlargement of the previous liver lesion, and subsequent biopsy was ordered. The biopsy was nonspecific and appeared to correlate to previous biopsy specimen. She was referred to [facility] for a second opinion. They have decided to proceed with left hepatic lobe resection.  - During the evaluation for this surgery a CT of the chest was ordered due to the patient's smoking history. She was found to have a 12mm and 74mm lung nodules. The patient had not been aware of this prior to this visit. We reviewed the report in Care Everywhere. She has been trying to contact the surgery office at [facility] in order to receive these results, but so far has not heard back from anyone.  - I informed her that it was very important that she continue to try to get in touch with them, as they will need to formulate a plan as to how to proceed in terms of likely need for biopsy of these lesions as well.   # Chronic Cough  - Pt. Here with cough that is actually at baseline for her. She has been a long time smoker, and she says she typically has a cough productive of clear sputum.  - She says that it was slightly worse over the past month after she had an upper respiratory infection that resolved.  - No production of blood or foul smelling sputum, no chest pain, no fevers, no chills.  - This is not all that different from her chronic cough.  - She would like tessalon perles to help with her cough.   All relevant systems were reviewed and were negative unless otherwise noted in the HPI  Past Medical History Reviewed problem list.  Medications- reviewed and updated Current  Outpatient Prescriptions  Medication Sig Dispense Refill  . ACCU-CHEK AVIVA PLUS test strip 1 each by Other route daily.     [name] albuterol (PROAIR HFA) 108 (90 BASE) MCG/ACT inhaler INHALE 2 PUFFS INTO THE LUNGS EVERY 4 HOURS AS NEEDED FOR WHEEZING OR SHORTNESS OF BREATH 1 Inhaler 0  . alendronate (FOSAMAX) 70 MG tablet TAKE 1 TABLET  ONCE A WEEK. (Patient taking differently: TAKE 1 TABLET  ONCE A WEEK.--Sunday) 12 tablet 2  . amLODipine (NORVASC) 10 MG tablet Take 1 tablet (10 mg total) by mouth daily with breakfast. 90 tablet 3  . aspirin 81 MG chewable tablet Chew 81 mg by mouth daily.      [name] atenolol (TENORMIN) 50 MG tablet Take 1 tablet (50 mg total) by mouth daily. 90 tablet 3  . atorvastatin (LIPITOR) 40 MG tablet Take 1 tablet (40 mg total) by mouth daily with breakfast. 90 tablet 3  . baclofen (LIORESAL) 10 MG tablet Take 1 tablet (10 mg total) by mouth 3 (three) times daily as needed for muscle spasms. 90 each 11  . benzonatate (TESSALON) 100 MG capsule Take 1 capsule (100 mg total) by mouth 3 (three) times daily as needed for cough. 45 capsule 0  . cholecalciferol (VITAMIN D) 1000 UNITS tablet Take 1 tablet (1,000 Units total) by mouth daily. 30 tablet 11  .  colchicine 0.6 MG tablet TAKE 1 TABLET DAILY AS NEEDED FOR GOUT FLARE 30 tablet 1  . diclofenac sodium (VOLTAREN) 1 % GEL Apply 2 g topically 4 (four) times daily as needed. (Patient taking differently: Apply 2 g topically 4 (four) times daily as needed (knee pain, elbow and hands). ) 100 g 4  . enalapril (VASOTEC) 20 MG tablet Take 1 tablet (20 mg total) by mouth daily with breakfast. 90 tablet 3  . fish oil-omega-3 fatty acids 1000 MG capsule Take 1 g by mouth every 30 (thirty) days.     . fluticasone (FLONASE) 50 MCG/ACT nasal spray Place 1 spray into the nose daily as needed for rhinitis.    [name] glipiZIDE (GLUCOTROL) 10 MG tablet Take 2 tablets (20 mg total) by mouth 2 (two) times daily before a meal. 180 tablet 3  .  hydrochlorothiazide (HYDRODIURIL) 25 MG tablet Take 1 tablet (25 mg total) by mouth daily with breakfast. 90 tablet 3  . Hyprom-Naphaz-Polysorb-Zn Sulf (CLEAR EYES COMPLETE OP) Place 1 drop into both eyes daily as needed (dry eyes).     [name] loratadine (CLARITIN) 10 MG tablet Take 1 tablet (10 mg total) by mouth daily as needed for allergies. 30 tablet 3  . metFORMIN (GLUCOPHAGE) 1000 MG tablet Take 1 tablet (1,000 mg total) by mouth 2 (two) times daily with a meal. Take 500mg  each morning, 1000mg  each night 180 tablet 3  . tiotropium (SPIRIVA) 18 MCG inhalation capsule Place 1 capsule (18 mcg total) into inhaler and inhale as needed (if its hot outside). 30 capsule 5  . VITAMIN E PO Take 1 tablet by mouth every 30 (thirty) days.      No current facility-administered medications for this visit.   Chief complaint-noted No additions to family history Social history- patient is a current smoker, though she says that she quit 4 days ago.   Objective: BP 181/95 mmHg  Pulse 74  Temp(Src) 98.1 F (36.7 C) (Oral)  Ht 5\' 3"  (1.6 m)  Wt 197 lb (89.359 kg)  BMI 34.91 kg/m2 Gen: NAD, alert, cooperative with exam HEENT: NCAT, EOMI, PERRL Neck: FROM, supple CV: RRR, good S1/S2, no murmur Resp: CTABL, no wheezes, non-labored Abd: SNTND, BS present, no guarding or organomegaly palpable.  Ext: No edema, warm, normal tone, moves UE/LE spontaneously Neuro: Alert and oriented, No gross deficits Skin: no rashes no lesions  Assessment/Plan:  # Liver Lesion / Lung Nodules  - I discussed these findings in the room with the patient. She says that she will continue to try to get in touch with the surgery team at [facility] to find out what the next steps will be going forward, though she will almost certainly need a biopsy of these lesions as well. Upon further review of the Care every where records from [facility], it appears that they have acknowledged these results and are planning for pulmonology consultation / biopsy.  We will see. Pt. To discuss this with them, and if she is unable to get through to them, she will call here/ will come back to see me and we will address it from our end.   # Chronic Cough  - most likely recently worse due to URI. She says that it is getting better, and no bloody sputum, fevers, abnormal sputum production, chest pain at this time. She will need a biopsy of the mentioned nodules given her smoking history.  - Tessalon perles to help with symptoms.  - Return if unable to get in touch  with [facility] surgery as above, and we will evaluate from our end.  - Will follow along closely with her

## 2015-01-04 ENCOUNTER — Other Ambulatory Visit: Payer: Self-pay | Admitting: Family Medicine

## 2015-02-09 DIAGNOSIS — C801 Malignant (primary) neoplasm, unspecified: Secondary | ICD-10-CM

## 2015-02-09 HISTORY — PX: OPEN PARTIAL HEPATECTOMY [83]: SHX5987

## 2015-02-09 HISTORY — DX: Malignant (primary) neoplasm, unspecified: C80.1

## 2015-02-10 DIAGNOSIS — Z9049 Acquired absence of other specified parts of digestive tract: Secondary | ICD-10-CM | POA: Insufficient documentation

## 2015-03-16 ENCOUNTER — Telehealth: Payer: Self-pay | Admitting: Gastroenterology

## 2015-03-16 NOTE — Telephone Encounter (Signed)
Left message on machine to call back  

## 2015-03-22 ENCOUNTER — Other Ambulatory Visit: Payer: Self-pay | Admitting: Family Medicine

## 2015-03-22 ENCOUNTER — Other Ambulatory Visit: Payer: Self-pay | Admitting: *Deleted

## 2015-03-22 ENCOUNTER — Telehealth: Payer: Self-pay | Admitting: *Deleted

## 2015-03-22 NOTE — Telephone Encounter (Signed)
Referral from Dr. Raylene Everts at [facility].  CT will be faxed hopefully this afternoon.

## 2015-03-22 NOTE — Telephone Encounter (Signed)
No answer and no voice mail.  Will wait for further communication from the pt  

## 2015-03-23 MED ORDER — HYDROCHLOROTHIAZIDE 25 MG PO TABS: 25.0000 mg | ORAL_TABLET | Freq: Every day | ORAL | Status: DC

## 2015-03-24 ENCOUNTER — Encounter: Payer: Self-pay | Admitting: Radiation Oncology

## 2015-03-24 NOTE — Progress Notes (Addendum)
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 Darla Lesches. Ravindra,MD [facility]  02/09/15   Past/Anticipated interventions by medical oncology, if any:Dr.Pappas, MD at [facility] last note 02/27/15  Weight changes, if any: None  Bowel/Bladder complaints, if any:hX IBD,   Nausea / Vomiting, if any: NO  Pain issues, if any:  Abdominal pain, soreness   Any blood per rectum:   NO  SAFETY ISSUES: NO  Prior radiation? NO  Pacemaker/ICD? NO  Possible current pregnancy? NO  Is the patient on methotrexate? NO  Current Details/C/O: Depression, sleep apnea,using c-pap DM II, smoker, no smokeless tobacco, drinks alcohol, no illcict drugs,  Mother Breast ca, CAD, Father brain cancer, Sister pancreatic cancer,   2014 hx symptoms r/t acute pancreatitis, 2.8cm liver mass seen on U/S abdomen,bxs taken on  , 02/11/13., MRI 02/17/13 done, 3.7cm arterial enhancing lesion, then on 12.10/14 consistent with bile duct adenoma  MRI 09/02/13 slightly enlarging lesion 3.1cm segment 4A  Liver lesion  Lesion close to portal vein, MRI at [facility] 09/15/14 slight increase size 3.9cm  bx 10/04/14 unsuccessful.   CT scan scheduled at [facility] 04/04/15 BP 117/49 mmHg  Pulse 64  Temp(Src) 98.5 F (36.9 C) (Oral)  Resp 20  Ht 5\' 3"  (1.6 m)  Wt 201 lb 1.6 oz (91.218 kg)  BMI 35.63 kg/m2  SpO2 100%  Wt Readings from Last 3 Encounters:  03/27/15 201 lb 1.6 oz (91.218 kg)  11/23/14 197 lb (89.359 kg)  09/15/14 208 lb (94.348 kg)

## 2015-03-27 ENCOUNTER — Ambulatory Visit
Admission: RE | Admit: 2015-03-27 | Discharge: 2015-03-27 | Disposition: A | Discharge status: Home / Self Care | Payer: Medicare HMO | Source: Ambulatory Visit | Attending: Radiation Oncology | Admitting: Radiation Oncology

## 2015-03-27 ENCOUNTER — Encounter: Payer: Self-pay | Admitting: Radiation Oncology

## 2015-03-27 VITALS — BP 117/49 | HR 64 | Temp 98.5°F | Resp 20 | Ht 63.0 in | Wt 201.1 lb

## 2015-03-27 DIAGNOSIS — Z510 Encounter for antineoplastic radiation therapy: Secondary | ICD-10-CM | POA: Diagnosis present

## 2015-03-27 DIAGNOSIS — C23 Malignant neoplasm of gallbladder: Secondary | ICD-10-CM | POA: Diagnosis present

## 2015-03-27 DIAGNOSIS — C221 Intrahepatic bile duct carcinoma: Secondary | ICD-10-CM

## 2015-03-27 DIAGNOSIS — C787 Secondary malignant neoplasm of liver and intrahepatic bile duct: Secondary | ICD-10-CM | POA: Insufficient documentation

## 2015-03-27 HISTORY — DX: Malignant (primary) neoplasm, unspecified: C80.1

## 2015-03-27 NOTE — Progress Notes (Signed)
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 A diagnosis of Intrahepatic cholangiocarcinoma ([place]) was also pertinent to this visit.   pT2aN1 Intrahepatic cholangiocarcinoma   HISTORY OF PRESENT ILLNESS::Bonnie Peterson is a 69 y.o. female who is seen for an initial consultation visit regarding the patient's diagnosis of Intrahepatic cholangiocarcinoma. The initially presented in 2014 with symptoms believed to be related to acute pancreatitis. During her work-up, she was found to have a 2.8 cm left liver mass on US of the abdomen on 02/11/13. MRI on 02/17/13 showed a hyperdense focus in the left hepatic lobe. CT on 03/01/13 showed a 3.7 cm arterial enhancing lesion. 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   On 09/03/14, MRI showed evidence of an enlarging 3.1 cm segment 4A liver lesion with adjacent perfusional enhancement with some washout with a lesion close to the portal vein. Follow up MRI on 09/15/14 showed continued enlargement now at 3.9 cm. The left portal vein was abutted and narrowed. Biopsy on 10/04/14 was unsuccessful.   The patient underwent partial hepatectomy of segments 2,3,4a and 4b with Dr.Ravindra on 02/09/15. There was a focal positive parenchymal margin. 1/2 lymph nodes were noted. Staged at [facility].   After surgery, she met with Dr. Dennison Nancy ([facility], MedOnc) on 03/17/15 who recommended she proceed with adjuvant chemoradiation (Xeloda). She met with Dr. Raylene Everts ( [facility], RadOnc) on 03/20/15. Since the patient lives in [place], it was determined that it would be easier for her to receive radiation treatment nearby and therfore was subsequently referred to me.   Today upon visit, the  patient has complaints of post-surgery soreness. No nausea, she notes that she commonly has diarrhea. No other pains noted.    PREVIOUS RADIATION THERAPY: No   PAST MEDICAL HISTORY:  has a past medical history of Obstructive sleep apnea; HTN, goal below 130/80; HLD (hyperlipidemia); Benign positional vertigo; Depression; Anxiety; Irritable bowel syndrome; Obesity, Class III, BMI 40-49.9 (morbid obesity) ([place]); Gout; Restrictive lung disease; Echocardiogram findings abnormal, without diagnosis (10/10); Early cataracts, bilateral (10/13); COPD (chronic obstructive pulmonary disease) ([place]); Cancer ([place]) (02/09/15); and Osteoarthritis (arthritis due to wear and tear of joints).     PAST SURGICAL HISTORY: Past Surgical History  Procedure Laterality Date  . Partial hysterectomy    . Rotator cuff repair        L rotator cuff repair 11/07-[place] - 12/7/200  . Tonsillectomy    . Liver biopsy    . Abdominal hysterectomy    . Open partial hepatectomy [83] Left 02/09/15     FAMILY HISTORY: family history includes Breast cancer in her mother; Coronary artery disease in her mother; Diabetes type II in her mother and sister; Hypertension in her mother; Pancreatic cancer in her sister.   SOCIAL HISTORY:  reports that she has quit smoking. Her smoking use included Cigarettes. She has a 20 pack-year smoking history. She has never used smokeless tobacco. She reports that she drinks alcohol. She reports that she does not use illicit drugs.   ALLERGIES: Other   MEDICATIONS:  Current Outpatient Prescriptions  Medication Sig Dispense Refill  . gabapentin (NEURONTIN) 100 MG capsule Take 100 mg by mouth 3 (three) times daily.  0  .  traMADol (ULTRAM) 50 MG tablet Take 50 mg by mouth every 6 (six) hours as needed.  0  . ACCU-CHEK AVIVA PLUS test strip 1 each by Other route daily.     [name] albuterol (PROAIR HFA) 108 (90 BASE) MCG/ACT inhaler INHALE 2 PUFFS INTO THE LUNGS EVERY 4 HOURS AS NEEDED FOR WHEEZING OR  SHORTNESS OF BREATH 1 Inhaler 0  . alendronate (FOSAMAX) 70 MG tablet TAKE 1 TABLET  ONCE A WEEK. (Patient taking differently: TAKE 1 TABLET  ONCE A WEEK.--Sunday) 12 tablet 2  . amLODipine (NORVASC) 10 MG tablet Take 1 tablet (10 mg total) by mouth daily with breakfast. 90 tablet 3  . aspirin 81 MG chewable tablet Chew 81 mg by mouth daily.      [name] atenolol (TENORMIN) 50 MG tablet Take 1 tablet (50 mg total) by mouth daily. 90 tablet 3  . atorvastatin (LIPITOR) 40 MG tablet Take 1 tablet (40 mg total) by mouth daily with breakfast. 90 tablet 3  . baclofen (LIORESAL) 10 MG tablet Take 1 tablet (10 mg total) by mouth 3 (three) times daily as needed for muscle spasms. 90 each 11  . benzonatate (TESSALON) 100 MG capsule Take 1 capsule (100 mg total) by mouth 3 (three) times daily as needed for cough. 45 capsule 0  . cholecalciferol (VITAMIN D) 1000 UNITS tablet Take 1 tablet (1,000 Units total) by mouth daily. 30 tablet 11  . colchicine 0.6 MG tablet TAKE 1 TABLET DAILY AS NEEDED FOR GOUT FLARE 30 tablet 1  . diclofenac sodium (VOLTAREN) 1 % GEL Apply 2 g topically 4 (four) times daily as needed. (Patient taking differently: Apply 2 g topically 4 (four) times daily as needed (knee pain, elbow and hands). ) 100 g 4  . enalapril (VASOTEC) 20 MG tablet Take 1 tablet (20 mg total) by mouth daily with breakfast. 90 tablet 3  . fish oil-omega-3 fatty acids 1000 MG capsule Take 1 g by mouth every 30 (thirty) days.     . fluticasone (FLONASE) 50 MCG/ACT nasal spray Place 1 spray into the nose daily as needed for rhinitis.    [name] glipiZIDE (GLUCOTROL) 10 MG tablet TAKE 2 TABLETS TWICE DAILY BEFORE A MEAL 360 tablet 3  . hydrochlorothiazide (HYDRODIURIL) 25 MG tablet Take 1 tablet (25 mg total) by mouth daily with breakfast. 90 tablet 3  . Hyprom-Naphaz-Polysorb-Zn Sulf (CLEAR EYES COMPLETE OP) Place 1 drop into both eyes daily as needed (dry eyes).     [name] loratadine (CLARITIN) 10 MG tablet Take 1 tablet (10 mg  total) by mouth daily as needed for allergies. 30 tablet 3  . metFORMIN (GLUCOPHAGE) 1000 MG tablet Take 1 tablet (1,000 mg total) by mouth 2 (two) times daily with a meal. Take 569m each morning, 10058meach night 180 tablet 3  . tiotropium (SPIRIVA) 18 MCG inhalation capsule Place 1 capsule (18 mcg total) into inhaler and inhale as needed (if its hot outside). 30 capsule 5  . VITAMIN E PO Take 1 tablet by mouth every 30 (thirty) days.      No current facility-administered medications for this encounter.    REVIEW OF SYSTEMS:  A 15 point review of systems is documented in the electronic medical record. This was obtained by the nursing staff. However, I reviewed this with the patient to discuss relevant findings and make appropriate changes.  Pertinent items are noted in HPI.    PHYSICAL EXAM:  height is 5' 3"  (1.6 m) and weight is 201 lb  1.6 oz (91.218 kg). Her oral temperature is 98.5 F (36.9 C). Her blood pressure is 117/49 and her pulse is 64. Her respiration is 20 and oxygen saturation is 100%.   General: Well-developed, in no acute distress HEENT: Normocephalic, atraumatic; oral cavity clear Neck: Supple without any lymphadenopathy Cardiovascular: Regular rate and rhythm Respiratory: Clear to auscultation bilaterally GI: Soft, nontender, normal bowel sounds Extremities: No edema present Neuro: No focal deficits Abdominal: Soft, non-tender, well-healed hepatectomy incision   ECOG = 0  0 - Asymptomatic (Fully active, able to carry on all predisease activities without restriction)  1 - Symptomatic but completely ambulatory (Restricted in physically strenuous activity but ambulatory and able to carry out work of a light or sedentary nature. For example, light housework, office work)  2 - Symptomatic, <50% in bed during the day (Ambulatory and capable of all self care but unable to carry out any work activities. Up and about more than 50% of waking hours)  3 - Symptomatic, >50% in  bed, but not bedbound (Capable of only limited self-care, confined to bed or chair 50% or more of waking hours)  4 - Bedbound (Completely disabled. Cannot carry on any self-care. Totally confined to bed or chair)  5 - Death   Eustace Pen MM, Creech RH, Tormey DC, et al. [phone]). "Toxicity and response criteria of the [facility]". [place] Oncol. 5 (6): 649-55  _   LABORATORY DATA:  Lab Results  Component Value Date   WBC 10.1 10/04/2014   HGB 14.0 10/04/2014   HCT 44.0 10/04/2014   MCV 89.4 10/04/2014   PLT 209 10/04/2014   Lab Results  Component Value Date   NA 137 04/14/2014   K 4.3 04/14/2014   CL 99 04/14/2014   CO2 27 04/14/2014   Lab Results  Component Value Date   ALT 14 02/10/2013   AST 13 02/10/2013   ALKPHOS 96 02/10/2013   BILITOT 0.3 02/10/2013     RADIOGRAPHY: No results found.    IMPRESSION:  The patient has been diagnosed with pT2aN1 intrahepatic cholangiocarcinoma. The patient is status post partial hepatectomy and is healing well. Considering the patient's high risk of recurrence (+ margin, +LN) the patient would be a good candidate to receive adjuvant radiation treatment to the resection bed/hepatic resection margin as well as the local regional lymph node basins. According to the patient, medical oncology at [facility] will arrange that Xeloda will be delivered to her to be taken as concurrent therapy.   PLAN: We discussed the possible side effects and risks of treatment in addition to the possible benefits of treatment. We discussed the protocol for radiation treatment.  All of the patient's questions were answered. The patient does wish to proceed with this treatment. A simulation will be scheduled such that we can proceed with treatment planning. We will also follow-up on the patient's upcoming CT scan on 04/04/2015 to ensure that there is no new disease.  I anticipate a 5.5 week course of treatment of chemoradiation treatment. The patient  will undergo simulation in the near future for treatment planning and I anticipate the patient beginning radiation treatment on approximately 04/12/2015.         ________________________________   Jodelle Gross, MD, PhD   **Disclaimer: This note was dictated with voice recognition software. Similar sounding words can inadvertently be transcribed and this note may contain transcription errors which may not have been corrected upon publication of note.**  This document serves as a  record of services personally performed by Kyung Rudd, MD. It was created on his behalf by Derek Mound, a trained medical scribe. The creation of this record is based on the scribe's personal observations and the provider's statements to them. This document has been checked and approved by the attending provider.

## 2015-03-27 NOTE — Progress Notes (Signed)
Please see the Nurse Progress Note in the MD Initial Consult Encounter for this patient. 

## 2015-03-29 ENCOUNTER — Ambulatory Visit
Admission: RE | Admit: 2015-03-29 | Discharge: 2015-03-29 | Disposition: A | Discharge status: Home / Self Care | Payer: Medicare HMO | Source: Ambulatory Visit | Attending: Radiation Oncology | Admitting: Radiation Oncology

## 2015-03-29 DIAGNOSIS — Z510 Encounter for antineoplastic radiation therapy: Secondary | ICD-10-CM | POA: Diagnosis not present

## 2015-03-29 DIAGNOSIS — C221 Intrahepatic bile duct carcinoma: Secondary | ICD-10-CM

## 2015-03-31 DIAGNOSIS — Z510 Encounter for antineoplastic radiation therapy: Secondary | ICD-10-CM | POA: Diagnosis not present

## 2015-04-05 ENCOUNTER — Telehealth: Payer: Self-pay | Admitting: *Deleted

## 2015-04-05 NOTE — Telephone Encounter (Signed)
Patient will be seen 04/12/15 with Dr. Lisbeth Renshaw, patient clled and left voice message on my phone stating she needed a chemothera[py MD and doesn't have one, to call her with a Dr. For that, called Earle Gell, GI Navigator and let her know of patient request, she will call the patient.thanked Merceda Elks, RN 10:06 AM

## 2015-04-05 NOTE — Telephone Encounter (Signed)
Oncology Nurse Navigator Documentation  Oncology Nurse Navigator Flowsheets 04/05/2015  Referral date to RadOnc/MedOnc 04/05/2015  Navigator Encounter Type Introductory phone call  Spoke with patient and provided new patient appointment for 11:00 with Dr. Irene Limbo in medical oncology. She expressed displeasure at the time, saying this is when she gets out to pay her bills. RN suggested she pay bills after seeing Dr. Irene Limbo. Made her aware this is the only opening we had due to [facility] not sending in the referral. Informed of location of [place], valet service, and registration process. Reminded to bring insurance cards and a current medication list, including supplements. Patient verbalizes understanding.

## 2015-04-11 ENCOUNTER — Encounter: Payer: Self-pay | Admitting: *Deleted

## 2015-04-11 ENCOUNTER — Ambulatory Visit (HOSPITAL_BASED_OUTPATIENT_CLINIC_OR_DEPARTMENT_OTHER): Payer: Medicare HMO | Admitting: Hematology

## 2015-04-11 ENCOUNTER — Telehealth: Payer: Self-pay | Admitting: Pharmacist

## 2015-04-11 ENCOUNTER — Encounter: Payer: Self-pay | Admitting: Hematology

## 2015-04-11 VITALS — BP 142/50 | HR 74 | Temp 98.0°F | Resp 18 | Ht 63.0 in | Wt 201.7 lb

## 2015-04-11 DIAGNOSIS — C221 Intrahepatic bile duct carcinoma: Secondary | ICD-10-CM | POA: Diagnosis not present

## 2015-04-11 MED ORDER — CAPECITABINE 500 MG PO TABS: 850.0000 mg/m2 | ORAL_TABLET | Freq: Two times a day (BID) | ORAL | Status: DC

## 2015-04-11 MED ORDER — ONDANSETRON HCL 8 MG PO TABS: 8.0000 mg | ORAL_TABLET | Freq: Three times a day (TID) | ORAL | Status: DC | PRN

## 2015-04-11 MED FILL — ONDANSETRON HCL 8 MG TABLET: 8 | 10 days supply | Qty: 30 | Fill #0

## 2015-04-11 NOTE — Progress Notes (Signed)
Oncology Nurse Navigator Documentation  Oncology Nurse Navigator Flowsheets 04/11/2015  Referral date to RadOnc/MedOnc -  Navigator Encounter Type Initial MedOnc  Patient Visit Type Medonc  Treatment Phase Treatment  Barriers/Navigation Needs Education  Education Preparing for Upcoming  Treatment;Pain/ Symptom Management  Interventions Coordination of Care;Education Method  Coordination of Care Other-call to oral chemo pharmacist to follow Xeloda script and alert MD to any significant delay   Education Method Verbal;Written  Support Groups/Services GI;ACS-Personal Health Manager  Time Spent with Patient 60  Met with patient during new patient visit. Explained the role of the GI Nurse Navigator and provided New Patient Packet with information on: 1. Cholangiocarcinoma cancer 2. Support groups 3. Advanced Directives 4. Fall Safety Plan Answered questions, reviewed current treatment plan using TEACH back and provided emotional support. Provided copy of current treatment plan. Will provide her a copy of her schedule when here for chemo class tomorrow.  Merceda Elks, RN, BSN GI Oncology [place]

## 2015-04-11 NOTE — Telephone Encounter (Signed)
Submitted Xeloda for Prior Authorization to [facility]  Reference [phone]  Rx takes 24 - 72 hours for approval. Notified Dr. Irene Limbo of above.  He will speak with Dr. Lisbeth Renshaw to decide if radiation treatment will proceed as planned on 04/12/15 or be delayed.

## 2015-04-11 NOTE — Progress Notes (Signed)
[name]    HEMATOLOGY/ONCOLOGY CONSULTATION NOTE  Date of Service: 04/11/2015  Patient Care Team: Aquilla Hacker, MD as PCP - General  CHIEF COMPLAINTS/PURPOSE OF CONSULTATION:   Evaluation for adjuvant treatment of Cholangiocarcinoma   ONCOLOGIC HISTORY  (as per notes from [place] 03/17/15 by Dr Dennison Nancy)  1. 2014 Mass in the left side of liver incidentally found during work up of acute pancreatitis. The mass was thought to be a bile duct lesion and was followed up. Had an episode of pancreatitis and presented to an outside ED. 2. 02/2013 MRI hyperdense focus within the left hepatic lobe.  3. 02/11/13 CT scan 3.7 x 2.1 x 2.4 cm arterial enhancing lesion. AFP and CEA were normal. 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 4. 03/17/13 Biopsy of something (surg path in Care Everywhere, but no report) 5. 07/02/13 Biopsy of something (surg path in Care Everywhere, but no report) 6. 09/02/13 MRI lesion in segment 4A with adjacent perfusional enhancement with some washout. Lesion is close to left portal vein. 7. 09/15/14 MRI segment 4A lesion slightly increased in size, minimal fat containing, peripheral enhancement and arterial enhancement. Left portal vein is clearly not involved. 8. 02/09/15 L lobe of liver partial hepatectomy segments 2, 3, 4a, 4b invasive cholangiocarcinoma, moderately differentiated. Tumor focally extends to the parenchymal margin of resection. Uninvolved liver parenchyma with steatohepatitis and associated periportal and centrilobular pericellular fibrosis. Scattered von Meyenburg complexes. One hepatic artery LN with metastatic cholangiocarcinoma. Fibroadipose tissue and nerve negative for malignancy. Gallbladder negative for malignancy. One LN negative for malignancy. T2aN1 invasive cholangiocarcinoma, moderately differentiated, with positive hepatic parenchymal margin, and 1/2 nodes positive for tumor involvement. 9. Postoperative course was  complicated by fevers felt to be related to atelectasis. 10. 02/15/15 Discharge from hospital.   HISTORY OF PRESENTING ILLNESS:   Bonnie Peterson is a wonderful 70 y.o. female who has been referred to [place] by Dr Lisbeth Renshaw for evaluation and adjuvant treatment of cholangiocarcinoma.  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 She was noted to have positive surgical margins and 1/2 positive regional LN's. This was diagnosed in a liver lesions that was presumably being monitored since 2014. Patient was seen at [facility] and was setup to get treated with Adjuvant concurrent chemo-radiation for high risk disease given positive surgical margins and 1/2 positive LN's.  Patient has been scheduled to start RT with Dr Lisbeth Renshaw and we have ordered her to Capecitabine concurrently. She notes that she would want to continue her post treatment f/u as [facility] with her surgeons and shall be getting restaging imaging at [facility]. She notes no other concerning focal symptoms at this time and would like to get started with adjuvant treatment ASAP.  No chest pain/sob,nausea,vomiting, diarrhea  MEDICAL HISTORY:  Past Medical History  Diagnosis Date  . Obstructive sleep apnea   . HTN, goal below 130/80   . HLD (hyperlipidemia)   . Benign positional vertigo   . Depression   . Anxiety   . Irritable bowel syndrome   . Obesity, Class III, BMI 40-49.9 (morbid obesity) ([place])   . Gout   . Restrictive lung disease   . Echocardiogram findings abnormal, without diagnosis 10/10    10/10: mild pulm HTN, EF 60-65%, mild LVH, moderate aortic regurg  . Early cataracts, bilateral 10/13    Optho,  Dr Gershon Crane  . COPD (chronic obstructive pulmonary disease) ([place])   . Cancer ([place]) 02/09/15    intrahepatic cholangiocarcinoma  . Osteoarthritis (arthritis due to wear and tear of joints)      also gout   SURGICAL HISTORY: Past Surgical History  Procedure Laterality Date  . Partial hysterectomy    . Rotator cuff repair        L rotator cuff repair 11/07-[place] - 12/7/200  . Tonsillectomy    . Liver biopsy    . Abdominal hysterectomy    . Open partial hepatectomy [83] Left 02/09/15    SOCIAL HISTORY: Social History   Social History  . Marital Status: Single    Spouse Name: N/A  . Number of Children: 4  . Years of Education: N/A   Occupational History  . unemployed    Social History Main Topics  . Smoking status: Current Some Day Smoker -- 0.50 packs/day for 40 years    Types: Cigarettes  . Smokeless tobacco: Never Used  . Alcohol Use: 0.0 oz/week    0 Standard drinks or equivalent per week     Comment: occasionally  . Drug Use: No  . Sexual Activity: Not on file   Other Topics Concern  . 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 Breast cancer Mother   . Hypertension Mother   . Coronary artery disease Mother   . Diabetes type II Sister   . Pancreatic cancer Sister   . Diabetes type II Mother     ALLERGIES:  is allergic to other.  MEDICATIONS:  Current Outpatient Prescriptions  Medication Sig Dispense Refill  . ACCU-CHEK AVIVA PLUS test strip 1 each by Other route daily.     [name] albuterol (PROAIR HFA) 108 (90 BASE) MCG/ACT inhaler INHALE 2 PUFFS INTO THE LUNGS EVERY 4 HOURS AS NEEDED FOR WHEEZING OR SHORTNESS OF BREATH 1 Inhaler 0  . alendronate (FOSAMAX) 70 MG tablet TAKE 1 TABLET  ONCE A WEEK. (Patient taking differently: TAKE 1 TABLET  ONCE A WEEK.--Sunday) 12 tablet 2  . amLODipine  (NORVASC) 10 MG tablet Take 1 tablet (10 mg total) by mouth daily with breakfast. 90 tablet 3  . aspirin 81 MG chewable tablet Chew 81 mg by mouth daily.      [name] atenolol (TENORMIN) 50 MG tablet Take 1 tablet (50 mg total) by mouth daily. 90 tablet 3  . atorvastatin (LIPITOR) 40 MG tablet Take 1 tablet (40 mg total) by mouth daily with breakfast. 90 tablet 3  . baclofen (LIORESAL) 10 MG tablet Take 1 tablet (10 mg total) by mouth 3 (three) times daily as needed for muscle spasms. 90 each 11  . benzonatate (TESSALON) 100 MG capsule Take 1 capsule (100 mg total) by mouth 3 (three) times daily as needed for cough. 45 capsule 0  . cholecalciferol (VITAMIN D) 1000 UNITS tablet Take 1 tablet (1,000 Units total) by mouth daily. 30 tablet 11  . colchicine 0.6 MG tablet TAKE 1 TABLET DAILY AS NEEDED FOR GOUT FLARE 30 tablet 1  .  diclofenac sodium (VOLTAREN) 1 % GEL Apply 2 g topically 4 (four) times daily as needed. (Patient taking differently: Apply 2 g topically 4 (four) times daily as needed (knee pain, elbow and hands). ) 100 g 4  . enalapril (VASOTEC) 20 MG tablet Take 1 tablet (20 mg total) by mouth daily with breakfast. 90 tablet 3  . enoxaparin (LOVENOX) 40 MG/0.4ML injection     . fish oil-omega-3 fatty acids 1000 MG capsule Take 1 g by mouth every 30 (thirty) days.     . fluticasone (FLONASE) 50 MCG/ACT nasal spray Place 1 spray into the nose daily as needed for rhinitis.    [name] gabapentin (NEURONTIN) 100 MG capsule Take 100 mg by mouth 3 (three) times daily.  0  . gabapentin (NEURONTIN) 100 MG capsule Take by mouth.    [name] glipiZIDE (GLUCOTROL) 10 MG tablet TAKE 2 TABLETS TWICE DAILY BEFORE A MEAL 360 tablet 3  . hydrochlorothiazide (HYDRODIURIL) 25 MG tablet Take 1 tablet (25 mg total) by mouth daily with breakfast. 90 tablet 3  . Hyprom-Naphaz-Polysorb-Zn Sulf (CLEAR EYES COMPLETE OP) Place 1 drop into both eyes daily as needed (dry eyes).     [name] loratadine (CLARITIN) 10 MG tablet Take 1 tablet (10  mg total) by mouth daily as needed for allergies. 30 tablet 3  . metFORMIN (GLUCOPHAGE) 1000 MG tablet Take 1 tablet (1,000 mg total) by mouth 2 (two) times daily with a meal. Take 531m each morning, 10034meach night 180 tablet 3  . tiotropium (SPIRIVA) 18 MCG inhalation capsule Place 1 capsule (18 mcg total) into inhaler and inhale as needed (if its hot outside). 30 capsule 5  . traMADol (ULTRAM) 50 MG tablet Take 50 mg by mouth every 6 (six) hours as needed.  0  . traMADol (ULTRAM) 50 MG tablet Take by mouth.    . Marland KitchenITAMIN E PO Take 1 tablet by mouth every 30 (thirty) days.      No current facility-administered medications for this visit.    REVIEW OF SYSTEMS:    10 Point review of Systems was done is negative except as noted above.  PHYSICAL EXAMINATION: ECOG PERFORMANCE STATUS: 1 - Symptomatic but completely ambulatory  . Filed Vitals:   04/11/15 1017  BP: 142/50  Pulse: 74  Temp: 98 F (36.7 C)  Resp: 18   Filed Weights   04/11/15 1017  Weight: 201 lb 11.2 oz (91.491 kg)   .Body mass index is 35.74 kg/(m^2).  GENERAL:alert, in no acute distress and comfortable SKIN: skin color, texture, turgor are normal, no rashes or significant lesions EYES: normal, conjunctiva are pink and non-injected, sclera clear OROPHARYNX:no exudate, no erythema and lips, buccal mucosa, and tongue normal  NECK: supple, no JVD, thyroid normal size, non-tender, without nodularity LYMPH:  no palpable lymphadenopathy in the cervical, axillary or inguinal LUNGS: clear to auscultation with normal respiratory effort HEART: regular rate & rhythm,  no murmurs and no lower extremity edema ABDOMEN: abdomen obese, soft, non-tender, normoactive bowel sounds , healed surgical wounds Musculoskeletal: no cyanosis of digits and no clubbing  PSYCH: alert & oriented x 3 with fluent speech NEURO: no focal motor/sensory deficits  LABORATORY DATA:  I have reviewed the data as listed  . Component     Latest  Ref Rng 04/12/2015  WBC     3.9 - 10.3 10e3/uL 10.0  NEUT#     1.5 - 6.5 10e3/uL 6.1  Hemoglobin     11.6 - 15.9 g/dL 12.6  HCT  34.8 - 46.6 % 40.0  Platelets     145 - 400 10e3/uL 212  MCV     79.5 - 101.0 fL 88.3  MCH     25.1 - 34.0 pg 27.8  MCHC     31.5 - 36.0 g/dL 31.5  RBC     3.70 - 5.45 10e6/uL 4.53  RDW     11.2 - 14.5 % 14.0  lymph#     0.9 - 3.3 10e3/uL 3.2  MONO#     0.1 - 0.9 10e3/uL 0.6  Eosinophils Absolute     0.0 - 0.5 10e3/uL 0.1  Basophils Absolute     0.0 - 0.1 10e3/uL 0.0  NEUT%     38.4 - 76.8 % 60.9  LYMPH%     14.0 - 49.7 % 31.8  MONO%     0.0 - 14.0 % 6.4  EOS%     0.0 - 7.0 % 0.7  BASO%     0.0 - 2.0 % 0.2  Retic %     0.70 - 2.10 % 3.06 (H)  Retic Ct Abs     33.70 - 90.70 10e3/uL 138.62 (H)  Immature Retic Fract     1.60 - 10.00 % 8.50  Sodium     136 - 145 mEq/L 138  Potassium     3.5 - 5.1 mEq/L 4.0  Chloride     98 - 109 mEq/L 99  CO2     22 - 29 mEq/L 28  Glucose     70 - 140 mg/dl 175 (H)  BUN     7.0 - 26.0 mg/dL 13.4  Creatinine     0.6 - 1.1 mg/dL 0.9  Total Bilirubin     0.20 - 1.20 mg/dL 0.35  Alkaline Phosphatase     40 - 150 U/L 134  AST     5 - 34 U/L 19  ALT     0 - 55 U/L 28  Total Protein     6.4 - 8.3 g/dL 8.4 (H)  Albumin     3.5 - 5.0 g/dL 3.5  Calcium     8.4 - 10.4 mg/dL 10.7 (H)  Anion gap     3 - 11 mEq/L 11  EGFR     >90 ml/min/1.73 m2 81 (L)  Magnesium     1.5 - 2.5 mg/dl 2.0  CA 19-9     <35.0 U/mL 9.1    CBC Latest Ref Rng 10/04/2014 10/09/2013 03/29/2013  WBC 4.0 - 10.5 K/uL 10.1 10.8(H) 11.2(H)  Hemoglobin 12.0 - 15.0 g/dL 14.0 14.0 12.9  Hematocrit 36.0 - 46.0 % 44.0 42.4 39.1  Platelets 150 - 400 K/uL 209 182 214.0    . CMP Latest Ref Rng 09/12/2014 04/14/2014 11/15/2013  Glucose 70 - 99 mg/dL - 177(H) 68(L)  BUN 6 - 23 mg/dL 11 20 14   Creatinine 0.40 - 1.20 mg/dL 0.74 0.68 0.60  Sodium 135 - 145 mEq/L - 137 142  Potassium 3.5 - 5.3 mEq/L - 4.3 3.8  Chloride 96 - 112  mEq/L - 99 103  CO2 19 - 32 mEq/L - 27 27  Calcium 8.4 - 10.5 mg/dL - 9.7 9.9  Total Protein 6.0 - 8.3 g/dL - - -  Total Bilirubin 0.3 - 1.2 mg/dL - - -  Alkaline Phos 39 - 117 U/L - - -  AST 0 - 37 U/L - - -  ALT 0 - 35 U/L - - -     RADIOGRAPHIC STUDIES:  I have personally reviewed the radiological images as listed and agreed with the findings in the report. No results found.   CT chest/abd/pelvis 04/04/2015 (at [facility]) Findings:  Chest:  Heart and great vessels appear normal. Lipomatous hypertrophy of the intra-atrial septum noted. No pericardial effusion, thickening or calcifications. The central pulmonary arteries are patent and normal in caliber. The central airways are patent. The thyroid is normal. The esophagus demonstrates no focal abnormality.  No mediastinal, hilar, supraclavicular, axillary, internal mammary or cardiophrenic adenopathy. Scattered subcentimeter mediastinal lymph nodes are unchanged.   Diffuse mosaic lung attenuation and subsegmental atelectasis noted. The lungs are otherwise clear. Stable 2-3 mm nodule in the left upper lobe (5/33) and a peripheral 6 mm right lower lobe nodule (image 52). No new pulmonary nodules. No pleural effusions.   Abdomen and Pelvis: No free intraperitoneal air or fluid.  The major intra-abdominal and pelvic vessels are patent. Scattered aortoiliac atherosclerotic disease noted, without aneurysm.  Interval left hepatectomy noted, without residual enhancing tumor at the lateral resection margin. No focal hepatic lesions are identified. No intra or extrahepatic biliary ductal dilatation. Gallbladder is surgically absent.  Spleen size normal. Incidental 5 mm low-attenuation lesion in the superior spleen (image 79). The pancreas appears normal. No pancreatic ductal dilatation. The adrenals are normal. The kidneys appear morphologically normal. No hydronephrosis or urolithiasis. Scattered subcentimeter low-attenuation  lesions in right renal cortex are too small to characterize.  The small and large bowel demonstrate normal wall thickness and caliber. Incidentally noted fat density polypoid lesion in the sigmoid colon, likely a colonic lipoma. Appendix normal.  Stable 1.3 cm short axis porta hepatis lymph node (image 104). No additional mesenteric or retroperitoneal adenopathy.  The urinary bladder appears unremarkable. The uterus is surgically absent.  No suspicious focal osseous lesions.    Impression: Interval left hepatectomy. No evidence of local disease recurrence or metastatic disease in the chest, abdomen or pelvis. Subcentimeter bilateral pulmonary nodules are unchanged.  Electronically Reviewed by: Erlene Senters, MD    ASSESSMENT & PLAN:   70 yo with multiple medical co-morbids  1) Intrahepatic invasive cholangiocarcinoma T2a N1 M0 (Stage IVA) with focal positive parenchymal margins and 1/2 LN positive. CT C/A/P on 04/03/2016 showed no evidence of cholangiocarcinoma local recurrence or metastases at this time. Plan -Orders placed for Capecitabine 847m/m2 Monday to Friday concurrent with RT. -continue f/u with Dr MLisbeth Renshawfor RT -we will see her every 1-2 weeks while getting active treatment. -she has been setup for chemo-counseling with the chemo education RN's -CMontel Clockour oral chemo pharmacist was informed. -all of the patient's questions were answered in details to her apparent satisfaction -she would [facility] We will follow alongwith the this.  2)  Patient Active Problem List   Diagnosis Date Noted  . Hypercalcemia 05/21/2015  . Bacterial folliculitis 053/97/6734 . Intrahepatic cholangiocarcinoma ([place] 03/27/2015  . Inadequate sleep hygiene 08/22/2014  . 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 IBS (irritable bowel syndrome) 03/22/2013  .  Osteoporosis, unspecified 10/15/2012  . Vitamin D deficiency 12/23/2011  . Tobacco abuse 10/12/2010  . COPD (chronic obstructive pulmonary disease) ([place] 06/14/2010  . Obstructive sleep apnea 04/27/2009  . OBESITY, UNSPECIFIED 04/26/2009  . DYSLIPIDEMIA 11/25/2007  . GOUT NOS 12/24/2006  . Diabetes mellitus, type 2 ([place] 06/05/2006  . DEPRESSION, MAJOR, RECURRENT 06/05/2006  . ANXIETY 06/05/2006  . HYPERTENSION, BENIGN SYSTEMIC 06/05/2006  . IRRITABLE BOWEL  SYNDROME 06/05/2006  . OSTEOARTHRITIS, MULTI SITES 06/05/2006   Plan -continue f/u with PCP for the ongoing management of other medical issues    All of the patients questions were answered with apparent satisfaction. The patient knows to call the clinic with any problems, questions or concerns.  I spent 55 minutes counseling the patient face to face. The total time spent in the appointment was 60 minutes and more than 50% was on counseling and direct patient cares.    Sullivan Lone MD [place] AAHIVMS [facility] Hematology/Oncology Physician [place]  (Office):       [phone] (Work cell):  [phone] (Fax):           [phone]  04/11/2015 11:27 AM

## 2015-04-12 ENCOUNTER — Telehealth: Payer: Self-pay | Admitting: Hematology

## 2015-04-12 ENCOUNTER — Other Ambulatory Visit (HOSPITAL_BASED_OUTPATIENT_CLINIC_OR_DEPARTMENT_OTHER): Payer: Medicare HMO

## 2015-04-12 ENCOUNTER — Encounter: Payer: Self-pay | Admitting: *Deleted

## 2015-04-12 ENCOUNTER — Ambulatory Visit: Payer: Medicare HMO | Admitting: Radiation Oncology

## 2015-04-12 ENCOUNTER — Telehealth: Payer: Self-pay | Admitting: *Deleted

## 2015-04-12 ENCOUNTER — Other Ambulatory Visit: Payer: Medicare HMO

## 2015-04-12 DIAGNOSIS — C221 Intrahepatic bile duct carcinoma: Secondary | ICD-10-CM

## 2015-04-12 LAB — COMPREHENSIVE METABOLIC PANEL
ALT: 28 U/L (ref 0–55)
AST: 19 U/L (ref 5–34)
Albumin: 3.5 g/dL (ref 3.5–5.0)
Alkaline Phosphatase: 134 U/L (ref 40–150)
Anion Gap: 11 mEq/L (ref 3–11)
BUN: 13.4 mg/dL (ref 7.0–26.0)
CO2: 28 mEq/L (ref 22–29)
Calcium: 10.7 mg/dL — ABNORMAL HIGH (ref 8.4–10.4)
Chloride: 99 mEq/L (ref 98–109)
Creatinine: 0.9 mg/dL (ref 0.6–1.1)
EGFR: 81 mL/min/{1.73_m2} — ABNORMAL LOW (ref >90)
Glucose: 175 mg/dl — ABNORMAL HIGH (ref 70–140)
Potassium: 4 mEq/L (ref 3.5–5.1)
Sodium: 138 mEq/L (ref 136–145)
Total Bilirubin: 0.35 mg/dL (ref 0.20–1.20)
Total Protein: 8.4 g/dL — ABNORMAL HIGH (ref 6.4–8.3)

## 2015-04-12 LAB — CBC & DIFF AND RETIC
BASO%: 0.2 % (ref 0.0–2.0)
Basophils Absolute: 0 10*3/uL (ref 0.0–0.1)
EOS%: 0.7 % (ref 0.0–7.0)
Eosinophils Absolute: 0.1 10*3/uL (ref 0.0–0.5)
HCT: 40 % (ref 34.8–46.6)
HGB: 12.6 g/dL (ref 11.6–15.9)
Immature Retic Fract: 8.5 % (ref 1.60–10.00)
LYMPH%: 31.8 % (ref 14.0–49.7)
MCH: 27.8 pg (ref 25.1–34.0)
MCHC: 31.5 g/dL (ref 31.5–36.0)
MCV: 88.3 fL (ref 79.5–101.0)
MONO#: 0.6 10*3/uL (ref 0.1–0.9)
MONO%: 6.4 % (ref 0.0–14.0)
NEUT#: 6.1 10*3/uL (ref 1.5–6.5)
NEUT%: 60.9 % (ref 38.4–76.8)
Platelets: 212 10*3/uL (ref 145–400)
RBC: 4.53 10*6/uL (ref 3.70–5.45)
RDW: 14 % (ref 11.2–14.5)
Retic %: 3.06 % — ABNORMAL HIGH (ref 0.70–2.10)
Retic Ct Abs: 138.62 10*3/uL — ABNORMAL HIGH (ref 33.70–90.70)
WBC: 10 10*3/uL (ref 3.9–10.3)
lymph#: 3.2 10*3/uL (ref 0.9–3.3)

## 2015-04-12 LAB — MAGNESIUM: Magnesium: 2 mg/dl (ref 1.5–2.5)

## 2015-04-12 MED ORDER — TRAMADOL HCL 50 MG PO TABS: 50.0000 mg | ORAL_TABLET | Freq: Four times a day (QID) | ORAL | Status: DC | PRN

## 2015-04-12 NOTE — Telephone Encounter (Signed)
Return call from [facility]: Fax CD request to file room [phone]. Request faxed.

## 2015-04-12 NOTE — Telephone Encounter (Signed)
per pof to sch pt appt-pt to get updaqted copy @ chemop class

## 2015-04-12 NOTE — Telephone Encounter (Signed)
per pof to sch pt appt-cld & spoke to pt and gave pt time & date of appt for 1/10

## 2015-04-12 NOTE — Telephone Encounter (Signed)
Oncology Nurse Navigator Documentation  Oncology Nurse Navigator Flowsheets 04/12/2015  Navigator Encounter Type Telephone  Telephone Outgoing Call  Patient Visit Type -  Treatment Phase -  Barriers/Navigation Needs Coordination of Care  Education -  Interventions Other-Left VM with triage nurse requesting CT from 04/04/15 be sent to Dr. Irene Limbo on CD.  Coordination of Care -  Education Method -  Support Groups/Services -  Time Spent with Patient -

## 2015-04-12 NOTE — Progress Notes (Signed)
Signed consent for xeloda while in chemo class.

## 2015-04-13 ENCOUNTER — Ambulatory Visit: Payer: Medicare HMO

## 2015-04-13 ENCOUNTER — Telehealth: Payer: Self-pay | Admitting: Pharmacist

## 2015-04-13 LAB — CANCER ANTIGEN 19-9: CA 19-9: 9.1 U/mL (ref <35.0)

## 2015-04-13 MED FILL — CAPECITABINE 500 MG TABLET: 500 | 28 days supply | Qty: 120 | Fill #0

## 2015-04-13 NOTE — Telephone Encounter (Signed)
04/13/15 - [employer] has approved Xeloda under Medicare Part B. Reference [phone]. [place] to run benefit analysis.

## 2015-04-14 ENCOUNTER — Ambulatory Visit: Payer: Medicare HMO

## 2015-04-14 ENCOUNTER — Encounter: Payer: Self-pay | Admitting: Hematology

## 2015-04-14 ENCOUNTER — Encounter: Payer: Self-pay | Admitting: Radiation Oncology

## 2015-04-14 NOTE — Progress Notes (Signed)
Called patient to introduce myself as Estate manager/land agent and to ask if she had any questions or concerns. Patient had a question about taking her chemo pill before scheduled radiation. She states she uses public transportation and not sure if they are going to be running or not on Monday. Advised pt to contact Dr.Kale's RN to find out if she should take her pill or not and what to do about radiation if she cannot make it. Patient's treatment is chemo pill and radiation. Asked patient how she was handling her personal household bills and she states she manages ok. Her main concern is food. She has to rely on friends if they can take her to the food bank. Will relay this to SW to see if there are any other available resources.

## 2015-04-14 NOTE — Telephone Encounter (Signed)
Called patient after she had called Elmyra Ricks, RT Therapist, asking about when to take Xeloda, infomred herafter meals,2x a day, only Mon-Fri days,  she asked, she starts Monday treatment  But if weather is bad, and she doesn't come in should she not take the Xeloda?,I told her no, but to clarify with Medical oncology dept, Dr. Grier Mitts RN, she willcall Monday and let us know if she can make it in transportation wise, thanked this RN for returning her call so soon 3:02 PM

## 2015-04-14 NOTE — Progress Notes (Signed)
Patient also states she has two coverages and she hasn't had to pay for medication.

## 2015-04-14 NOTE — Progress Notes (Signed)
Spoke with Grier(SW) concerning food issue. She will have someone from her team reach out to patient to access this further.

## 2015-04-17 ENCOUNTER — Ambulatory Visit: Admission: RE | Admit: 2015-04-17 | Payer: Medicare HMO | Source: Ambulatory Visit | Admitting: Radiation Oncology

## 2015-04-17 ENCOUNTER — Ambulatory Visit
Admission: RE | Admit: 2015-04-17 | Discharge: 2015-04-17 | Disposition: A | Discharge status: Home / Self Care | Payer: Medicare HMO | Source: Ambulatory Visit | Attending: Radiation Oncology | Admitting: Radiation Oncology

## 2015-04-18 ENCOUNTER — Encounter: Payer: Self-pay | Admitting: *Deleted

## 2015-04-18 ENCOUNTER — Telehealth: Payer: Self-pay | Admitting: Hematology

## 2015-04-18 ENCOUNTER — Encounter: Payer: Self-pay | Admitting: Hematology

## 2015-04-18 ENCOUNTER — Ambulatory Visit
Admission: RE | Admit: 2015-04-18 | Discharge: 2015-04-18 | Disposition: A | Discharge status: Home / Self Care | Payer: Medicare HMO | Source: Ambulatory Visit | Attending: Radiation Oncology | Admitting: Radiation Oncology

## 2015-04-18 ENCOUNTER — Ambulatory Visit: Payer: Medicare HMO

## 2015-04-18 ENCOUNTER — Ambulatory Visit (HOSPITAL_BASED_OUTPATIENT_CLINIC_OR_DEPARTMENT_OTHER): Payer: Medicare HMO | Admitting: Hematology

## 2015-04-18 VITALS — BP 100/55 | HR 70 | Temp 98.1°F | Resp 18 | Ht 63.0 in | Wt 199.8 lb

## 2015-04-18 DIAGNOSIS — Z510 Encounter for antineoplastic radiation therapy: Secondary | ICD-10-CM | POA: Diagnosis not present

## 2015-04-18 DIAGNOSIS — D134 Benign neoplasm of liver: Secondary | ICD-10-CM | POA: Diagnosis not present

## 2015-04-18 DIAGNOSIS — C221 Intrahepatic bile duct carcinoma: Secondary | ICD-10-CM

## 2015-04-18 MED ORDER — RADIAPLEXRX EX GEL
Freq: Once | CUTANEOUS | Status: AC
  Administered 2015-04-18: 12:00:00 via TOPICAL

## 2015-04-18 NOTE — Telephone Encounter (Signed)
Called patient. Made patient aware of upcoming appointment.       [facility].

## 2015-04-18 NOTE — Progress Notes (Signed)
[place] Work  Clinical Social Work was referred by financial advocate for food concerns and assessment of psychosocial needs.  Clinical Social Worker met with patient at [facility] to offer support and assess for needs.  Patient stated she received $16 dollars in food stamps, and was having issues with transportation.  Patient reported that she uses Medicaid transportation but has had some difficulties coordinating her appointments.  Patient stated she has attempted to contact with her Medicaid Social Worker regarding her concerns.   CSW and patient discussed additional transportation resources including [employer].  Patient did not wish to use [employer] due to past experiences.  Patient also has [facility] which will provide 24 one way or 12 round trips.  CSW provided patient with contact information and process for scheduling transportation through [place].   CSW and patient discussed the [place] and patient was agreeable to applying.  CSw will inform financial advocate that she is planning to bring proof of income with her tomorrow prior to her appointment.  CSw provided contact information and encouraged patient to call with questions or concerns.         Johnnye Lana, MSW, LCSW, OSW-C Clinical Social Worker [facility] [phone]

## 2015-04-18 NOTE — Progress Notes (Signed)
Pt education done by Patric Dykes, RN

## 2015-04-18 NOTE — Progress Notes (Signed)
Pt here for patient teaching.  Pt given Radiation and You booklet, skin care instructions and Radiaplex gel. Pt reports they have watched, not watched and refused to watch the Radiation Therapy Education video on April 18, 2015.  Reviewed areas of pertinence such as diarrhea, fatigue, nausea and vomiting and skin changes . Pt able to give teach back of to pat skin, use unscented/gentle soap, have Imodium on hand and drink plenty of water,apply Radiaplex bid and avoid applying anything to skin within 4 hours of treatment. Pt demonstrated understanding of information given and will contact nursing with any questions or concerns.

## 2015-04-19 ENCOUNTER — Ambulatory Visit
Admission: RE | Admit: 2015-04-19 | Discharge: 2015-04-19 | Disposition: A | Discharge status: Home / Self Care | Payer: Medicare HMO | Source: Ambulatory Visit | Attending: Radiation Oncology | Admitting: Radiation Oncology

## 2015-04-19 DIAGNOSIS — Z510 Encounter for antineoplastic radiation therapy: Secondary | ICD-10-CM | POA: Diagnosis not present

## 2015-04-20 ENCOUNTER — Ambulatory Visit
Admission: RE | Admit: 2015-04-20 | Discharge: 2015-04-20 | Disposition: A | Discharge status: Home / Self Care | Payer: Medicare HMO | Source: Ambulatory Visit | Attending: Radiation Oncology | Admitting: Radiation Oncology

## 2015-04-20 DIAGNOSIS — Z510 Encounter for antineoplastic radiation therapy: Secondary | ICD-10-CM | POA: Diagnosis not present

## 2015-04-21 ENCOUNTER — Ambulatory Visit
Admission: RE | Admit: 2015-04-21 | Discharge: 2015-04-21 | Disposition: A | Discharge status: Home / Self Care | Payer: Medicare HMO | Source: Ambulatory Visit | Attending: Radiation Oncology | Admitting: Radiation Oncology

## 2015-04-21 ENCOUNTER — Encounter: Payer: Self-pay | Admitting: Radiation Oncology

## 2015-04-21 VITALS — BP 100/53 | HR 64 | Temp 97.7°F | Resp 20 | Wt 198.6 lb

## 2015-04-21 DIAGNOSIS — Z510 Encounter for antineoplastic radiation therapy: Secondary | ICD-10-CM | POA: Diagnosis not present

## 2015-04-21 DIAGNOSIS — C221 Intrahepatic bile duct carcinoma: Secondary | ICD-10-CM

## 2015-04-21 NOTE — Progress Notes (Signed)
Weekly rad tx abdomen, no c/o nausea, did have diarrhea x2 yesterday but resolved, takes Xeloda bid after meals, appetite gogod, drinks water gatorade and water,  Just sleepy  Feeling stated, goes home and goes to sleep BP 100/53 mmHg  Pulse 64  Temp(Src) 97.7 F (36.5 C) (Oral)  Resp 20  Wt 198 lb 9.6 oz (90.084 kg)  Wt Readings from Last 3 Encounters:  04/21/15 198 lb 9.6 oz (90.084 kg)  04/18/15 199 lb 12.8 oz (90.629 kg)  04/11/15 201 lb 11.2 oz (91.491 kg)

## 2015-04-21 NOTE — Progress Notes (Signed)
Department of Radiation Oncology  Phone:  [phone] Fax:        [phone]  Weekly Treatment Note    Name: Bonnie Peterson Date: 04/21/2015 MRN: [id number] DOB: Jun 12, 1945   Diagnosis:     ICD-9-CM ICD-10-CM   1. 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 ACCU-CHEK AVIVA PLUS test strip 1 each by Other route daily.     [name] albuterol (PROAIR HFA) 108 (90 BASE) MCG/ACT inhaler INHALE 2 PUFFS INTO THE LUNGS EVERY 4 HOURS AS NEEDED FOR WHEEZING OR SHORTNESS OF BREATH 1 Inhaler 0  . alendronate (FOSAMAX) 70 MG tablet TAKE 1 TABLET  ONCE A WEEK. (Patient taking differently: TAKE 1 TABLET  ONCE A WEEK.--Sunday) 12 tablet 2  . amLODipine (NORVASC) 10 MG tablet Take 1 tablet (10 mg total) by mouth daily with breakfast. 90 tablet 3  . aspirin 81 MG chewable tablet Chew 81 mg by mouth daily.      [name] atenolol (TENORMIN) 50 MG tablet Take 1 tablet (50 mg total) by mouth daily. 90 tablet 3  . baclofen (LIORESAL) 10 MG tablet Take 1 tablet (10 mg total) by mouth 3 (three) times daily as needed for muscle spasms. 90 each 11  . benzonatate (TESSALON) 100 MG capsule Take 1 capsule (100 mg total) by mouth 3 (three) times daily as needed for cough. 45 capsule 0  . capecitabine (XELODA) 500 MG tablet Take 3 tablets (1,500 mg total) by mouth 2 (two) times daily after a meal. Monday through Friday while getting radiation therapy. 150 tablet 0  . cholecalciferol (VITAMIN D) 1000 UNITS tablet Take 1 tablet (1,000 Units total) by mouth daily. 30 tablet 11  . colchicine 0.6 MG tablet TAKE 1 TABLET DAILY AS NEEDED FOR GOUT FLARE 30 tablet 1  . diclofenac sodium (VOLTAREN) 1 % GEL Apply 2 g topically 4 (four) times daily as needed. (Patient taking differently: Apply 2 g topically 4 (four) times daily as needed (knee pain, elbow and hands). ) 100 g 4  . enalapril  (VASOTEC) 20 MG tablet Take 1 tablet (20 mg total) by mouth daily with breakfast. 90 tablet 3  . fish oil-omega-3 fatty acids 1000 MG capsule Take 1 g by mouth every 30 (thirty) days.     . fluticasone (FLONASE) 50 MCG/ACT nasal spray Place 1 spray into the nose daily as needed for rhinitis.    . hyaluronate sodium (RADIAPLEXRX) GEL Apply 1 application topically 2 (two) times daily.    . hydrochlorothiazide (HYDRODIURIL) 25 MG tablet Take 1 tablet (25 mg total) by mouth daily with breakfast. 90 tablet 3  . Hyprom-Naphaz-Polysorb-Zn Sulf (CLEAR EYES COMPLETE OP) Place 1 drop into both eyes daily as needed (dry eyes).     [name] loratadine (CLARITIN) 10 MG tablet Take 1 tablet (10 mg total) by mouth daily as needed for allergies. 30 tablet 3  . metFORMIN (GLUCOPHAGE) 1000 MG tablet Take 1 tablet (1,000 mg total) by mouth 2 (two) times daily with a meal. Take 500mg  each morning, 1000mg  each night 180 tablet 3  . ondansetron (ZOFRAN) 8 MG tablet Take 1 tablet (8 mg total) by mouth every 8 (eight) hours as needed for nausea or vomiting. 30 tablet 3  . tiotropium (SPIRIVA) 18 MCG inhalation capsule Place 1 capsule (18 mcg total) into inhaler and inhale as needed (if its hot outside). [place]  capsule 5  . traMADol (ULTRAM) 50 MG tablet Take 1 tablet (50 mg total) by mouth every 6 (six) hours as needed for moderate pain or severe pain. 30 tablet 0  . VITAMIN E PO Take 1 tablet by mouth every 30 (thirty) days.      No current facility-administered medications for this encounter.     ALLERGIES: Other   LABORATORY DATA:  Lab Results  Component Value Date   WBC 10.0 04/12/2015   HGB 12.6 04/12/2015   HCT 40.0 04/12/2015   MCV 88.3 04/12/2015   PLT 212 04/12/2015   Lab Results  Component Value Date   NA 138 04/12/2015   K 4.0 04/12/2015   CL 99 04/14/2014   CO2 28 04/12/2015   Lab Results  Component Value Date   ALT 28 04/12/2015   AST 19 04/12/2015   ALKPHOS 134 04/12/2015   BILITOT 0.35  04/12/2015     NARRATIVE: Bonnie Peterson was seen today for weekly treatment management. The chart was checked and the patient's films were reviewed.  Weekly rad tx abdomen, no c/o nausea, did have diarrhea x2 yesterday but resolved, takes Xeloda bid after meals, appetite gogod, drinks water gatorade and water,  Just sleepy  Feeling stated, goes home and goes to sleep BP 100/53 mmHg  Pulse 64  Temp(Src) 97.7 F (36.5 C) (Oral)  Resp 20  Wt 198 lb 9.6 oz (90.084 kg)  Wt Readings from Last 3 Encounters:  04/21/15 198 lb 9.6 oz (90.084 kg)  04/18/15 199 lb 12.8 oz (90.629 kg)  04/11/15 201 lb 11.2 oz (91.491 kg)    PHYSICAL EXAMINATION: weight is 198 lb 9.6 oz (90.084 kg). Her oral temperature is 97.7 F (36.5 C). Her blood pressure is 100/53 and her pulse is 64. Her respiration is 20.        ASSESSMENT: The patient is doing satisfactorily with treatment.  PLAN: We will continue with the patient's radiation treatment as planned.

## 2015-04-22 ENCOUNTER — Ambulatory Visit: Payer: Medicare HMO

## 2015-04-23 NOTE — Progress Notes (Signed)
  Radiation Oncology         (336) [phone] ________________________________  Name: Bonnie Peterson MRN: [id number]  Date: 03/29/2015  DOB: 1946-01-25  SIMULATION AND TREATMENT PLANNING NOTE  DIAGNOSIS:     ICD-9-CM ICD-10-CM   1. 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  Identity was confirmed.  All relevant records and images related to the planned course of therapy were reviewed.   Written consent to proceed with treatment was confirmed which was freely given after reviewing the details related to the planned course of therapy had been reviewed with the patient.  Then, the patient was set-up in a stable reproducible  supine position for radiation therapy.  CT images were obtained.  Surface markings were placed.    Medically necessary complex treatment device(s) for immobilization:  Customized vac lock bag.   The CT images were loaded into the planning software.  Then the target and avoidance structures were contoured.  Treatment planning then occurred.  The radiation prescription was entered and confirmed.  A total of 4 complex treatment devices were fabricated which relate to the designed radiation treatment fields based on the tomotherapy final treatment plan. Each of these customized fields/ complex treatment devices will be used on a daily basis during the radiation course. I have requested : 3D Simulation  I have requested a DVH of the following structures: Target volume, liver, spinal cord.   PLAN:  The patient will receive 45 Gy in 25 fractions. The patient will then receive a 5.4 gray boost to yield a final total dose of 50.4 gray.  ________________________________   Jodelle Gross, MD, PhD

## 2015-04-23 NOTE — Addendum Note (Signed)
Encounter addended by: Kyung Rudd, MD on: 04/23/2015 12:05 PM<BR>     Documentation filed: Notes Section, Visit Diagnoses

## 2015-04-24 ENCOUNTER — Ambulatory Visit
Admission: RE | Admit: 2015-04-24 | Discharge: 2015-04-24 | Disposition: A | Discharge status: Home / Self Care | Payer: Medicare HMO | Source: Ambulatory Visit | Attending: Radiation Oncology | Admitting: Radiation Oncology

## 2015-04-24 DIAGNOSIS — Z510 Encounter for antineoplastic radiation therapy: Secondary | ICD-10-CM | POA: Diagnosis not present

## 2015-04-25 ENCOUNTER — Telehealth: Payer: Self-pay | Admitting: *Deleted

## 2015-04-25 ENCOUNTER — Ambulatory Visit
Admission: RE | Admit: 2015-04-25 | Discharge: 2015-04-25 | Disposition: A | Discharge status: Home / Self Care | Payer: Medicare HMO | Source: Ambulatory Visit | Attending: Radiation Oncology | Admitting: Radiation Oncology

## 2015-04-25 DIAGNOSIS — Z510 Encounter for antineoplastic radiation therapy: Secondary | ICD-10-CM | POA: Diagnosis not present

## 2015-04-25 NOTE — Telephone Encounter (Signed)
Called patient to offer flu vaccine. Patient states she received flu vaccine at [facility] on 02/15/2015. Added to historical immunization record. Velora Heckler, RN

## 2015-04-26 ENCOUNTER — Ambulatory Visit
Admission: RE | Admit: 2015-04-26 | Discharge: 2015-04-26 | Disposition: A | Discharge status: Home / Self Care | Payer: Medicare HMO | Source: Ambulatory Visit | Attending: Radiation Oncology | Admitting: Radiation Oncology

## 2015-04-26 DIAGNOSIS — Z510 Encounter for antineoplastic radiation therapy: Secondary | ICD-10-CM | POA: Diagnosis not present

## 2015-04-27 ENCOUNTER — Ambulatory Visit
Admission: RE | Admit: 2015-04-27 | Discharge: 2015-04-27 | Disposition: A | Discharge status: Home / Self Care | Payer: Medicare HMO | Source: Ambulatory Visit | Attending: Radiation Oncology | Admitting: Radiation Oncology

## 2015-04-27 DIAGNOSIS — Z510 Encounter for antineoplastic radiation therapy: Secondary | ICD-10-CM | POA: Diagnosis not present

## 2015-04-28 ENCOUNTER — Telehealth: Payer: Self-pay | Admitting: Oncology

## 2015-04-28 ENCOUNTER — Ambulatory Visit
Admission: RE | Admit: 2015-04-28 | Discharge: 2015-04-28 | Disposition: A | Discharge status: Home / Self Care | Payer: Medicare HMO | Source: Ambulatory Visit | Attending: Radiation Oncology | Admitting: Radiation Oncology

## 2015-04-28 ENCOUNTER — Encounter: Payer: Self-pay | Admitting: Radiation Oncology

## 2015-04-28 VITALS — BP 128/61 | HR 59 | Temp 97.6°F | Resp 18 | Ht 63.0 in | Wt 197.5 lb

## 2015-04-28 DIAGNOSIS — Z510 Encounter for antineoplastic radiation therapy: Secondary | ICD-10-CM | POA: Diagnosis not present

## 2015-04-28 DIAGNOSIS — C221 Intrahepatic bile duct carcinoma: Secondary | ICD-10-CM

## 2015-04-28 NOTE — Progress Notes (Signed)
Bonnie Peterson has completed 9 fractions to her abdomen.  She denies having any pain or nausea.  She is taking Xeloda.  She reports having a good appetite.  She reports having 3 episodes of diarrhea on Monday.  She did not have to take Imodium.  She reports feeling sleepy.  She denies having any skin irritation on her abdomen.   BP 128/61 mmHg  Pulse 59  Temp(Src) 97.6 F (36.4 C) (Oral)  Resp 18  Ht 5' 3"  (1.6 m)  Wt 197 lb 8 oz (89.585 kg)  BMI 34.99 kg/m2   Wt Readings from Last 3 Encounters:  04/28/15 197 lb 8 oz (89.585 kg)  04/21/15 198 lb 9.6 oz (90.084 kg)  04/18/15 199 lb 12.8 oz (90.629 kg)

## 2015-04-28 NOTE — Progress Notes (Signed)
Department of Radiation Oncology  Phone:  [phone] Fax:        [phone]  Weekly Treatment Note    Name: Bonnie Peterson Date: 04/28/2015 MRN: [id number] DOB: [birth year]   Diagnosis:     ICD-9-CM ICD-10-CM   1. 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 ACCU-CHEK AVIVA PLUS test strip 1 each by Other route daily.     [name] albuterol (PROAIR HFA) 108 (90 BASE) MCG/ACT inhaler INHALE 2 PUFFS INTO THE LUNGS EVERY 4 HOURS AS NEEDED FOR WHEEZING OR SHORTNESS OF BREATH 1 Inhaler 0  . alendronate (FOSAMAX) 70 MG tablet TAKE 1 TABLET  ONCE A WEEK. (Patient taking differently: TAKE 1 TABLET  ONCE A WEEK.--Sunday) 12 tablet 2  . amLODipine (NORVASC) 10 MG tablet Take 1 tablet (10 mg total) by mouth daily with breakfast. 90 tablet 3  . atenolol (TENORMIN) 50 MG tablet Take 1 tablet (50 mg total) by mouth daily. 90 tablet 3  . capecitabine (XELODA) 500 MG tablet Take 3 tablets (1,500 mg total) by mouth 2 (two) times daily after a meal. Monday through Friday while getting radiation therapy. 150 tablet 0  . cholecalciferol (VITAMIN D) 1000 UNITS tablet Take 1 tablet (1,000 Units total) by mouth daily. 30 tablet 11  . diclofenac sodium (VOLTAREN) 1 % GEL Apply 2 g topically 4 (four) times daily as needed. (Patient taking differently: Apply 2 g topically 4 (four) times daily as needed (knee pain, elbow and hands). ) 100 g 4  . enalapril (VASOTEC) 20 MG tablet Take 1 tablet (20 mg total) by mouth daily with breakfast. 90 tablet 3  . fish oil-omega-3 fatty acids 1000 MG capsule Take 1 g by mouth every 30 (thirty) days.     . fluticasone (FLONASE) 50 MCG/ACT nasal spray Place 1 spray into the nose daily as needed for rhinitis.    . hyaluronate sodium (RADIAPLEXRX) GEL Apply 1 application topically 2 (two) times daily.    . hydrochlorothiazide (HYDRODIURIL)  25 MG tablet Take 1 tablet (25 mg total) by mouth daily with breakfast. 90 tablet 3  . Hyprom-Naphaz-Polysorb-Zn Sulf (CLEAR EYES COMPLETE OP) Place 1 drop into both eyes daily as needed (dry eyes).     [name] loratadine (CLARITIN) 10 MG tablet Take 1 tablet (10 mg total) by mouth daily as needed for allergies. 30 tablet 3  . metFORMIN (GLUCOPHAGE) 1000 MG tablet Take 1 tablet (1,000 mg total) by mouth 2 (two) times daily with a meal. Take 500mg  each morning, 1000mg  each night 180 tablet 3  . tiotropium (SPIRIVA) 18 MCG inhalation capsule Place 1 capsule (18 mcg total) into inhaler and inhale as needed (if its hot outside). 30 capsule 5  . traMADol (ULTRAM) 50 MG tablet Take 1 tablet (50 mg total) by mouth every 6 (six) hours as needed for moderate pain or severe pain. 30 tablet 0  . VITAMIN E PO Take 1 tablet by mouth every 30 (thirty) days.     [name] aspirin 81 MG chewable tablet Chew 81 mg by mouth daily. Reported on 04/28/2015    . baclofen (LIORESAL) 10 MG tablet Take 1 tablet (10 mg total) by mouth 3 (three) times daily as needed for muscle spasms. (Patient not taking: Reported on 04/28/2015) 90 each 11  . colchicine 0.6 MG tablet TAKE 1 TABLET DAILY AS NEEDED FOR GOUT FLARE (Patient  not taking: Reported on 04/28/2015) 30 tablet 1  . ondansetron (ZOFRAN) 8 MG tablet Take 1 tablet (8 mg total) by mouth every 8 (eight) hours as needed for nausea or vomiting. (Patient not taking: Reported on 04/28/2015) 30 tablet 3   No current facility-administered medications for this encounter.     ALLERGIES: Other   LABORATORY DATA:  Lab Results  Component Value Date   WBC 10.0 04/12/2015   HGB 12.6 04/12/2015   HCT 40.0 04/12/2015   MCV 88.3 04/12/2015   PLT 212 04/12/2015   Lab Results  Component Value Date   NA 138 04/12/2015   K 4.0 04/12/2015   CL 99 04/14/2014   CO2 28 04/12/2015   Lab Results  Component Value Date   ALT 28 04/12/2015   AST 19 04/12/2015   ALKPHOS 134 04/12/2015   BILITOT 0.35  04/12/2015     NARRATIVE: Bonnie Peterson was seen today for weekly treatment management. The chart was checked and the patient's films were reviewed.  She denies having any pain or nausea. She is taking Xeloda. She reports having a good appetite. She reports having 3 episodes of diarrhea on Monday. She did not have to take Imodium. She reports feeling sleepy. She denies having any skin irritation on her abdomen.   BP 128/61 mmHg  Pulse 59  Temp(Src) 97.6 F (36.4 C) (Oral)  Resp 18  Ht 5\' 3"  (1.6 m)  Wt 197 lb 8 oz (89.585 kg)  BMI 34.99 kg/m2  Wt Readings from Last 3 Encounters:  04/28/15 197 lb 8 oz (89.585 kg)  04/21/15 198 lb 9.6 oz (90.084 kg)  04/18/15 199 lb 12.8 oz (90.629 kg)    PHYSICAL EXAMINATION: height is 5\' 3"  (1.6 m) and weight is 197 lb 8 oz (89.585 kg). Her oral temperature is 97.6 F (36.4 C). Her blood pressure is 128/61 and her pulse is 59. Her respiration is 18.         ASSESSMENT: The patient is doing satisfactorily with treatment.  PLAN: We will continue with the patient's radiation treatment as planned.   This document serves as a record of services personally performed by Kyung Rudd, MD. It was created on his behalf by Darcus Austin, a trained medical scribe. The creation of this record is based on the scribe's personal observations and the provider's statements to them. This document has been checked and approved by the attending provider.

## 2015-04-28 NOTE — Patient Instructions (Signed)
Contact our office if you have any questions following today's appointment: [phone].

## 2015-04-28 NOTE — Telephone Encounter (Signed)
Called Bonnie Peterson and advised her that a prescription is ready for her to pick up in the Radiation Nursing area.  [name] verbalized understanding and will pick it up on Monday.

## 2015-05-01 ENCOUNTER — Ambulatory Visit
Admission: RE | Admit: 2015-05-01 | Discharge: 2015-05-01 | Disposition: A | Discharge status: Home / Self Care | Payer: Medicare HMO | Source: Ambulatory Visit | Attending: Radiation Oncology | Admitting: Radiation Oncology

## 2015-05-01 DIAGNOSIS — Z510 Encounter for antineoplastic radiation therapy: Secondary | ICD-10-CM | POA: Diagnosis not present

## 2015-05-02 ENCOUNTER — Encounter: Payer: Self-pay | Admitting: Hematology

## 2015-05-02 ENCOUNTER — Other Ambulatory Visit (HOSPITAL_BASED_OUTPATIENT_CLINIC_OR_DEPARTMENT_OTHER): Payer: Medicare HMO

## 2015-05-02 ENCOUNTER — Ambulatory Visit (HOSPITAL_BASED_OUTPATIENT_CLINIC_OR_DEPARTMENT_OTHER): Payer: Medicare HMO | Admitting: Hematology

## 2015-05-02 ENCOUNTER — Ambulatory Visit
Admission: RE | Admit: 2015-05-02 | Discharge: 2015-05-02 | Disposition: A | Discharge status: Home / Self Care | Payer: Medicare HMO | Source: Ambulatory Visit | Attending: Radiation Oncology | Admitting: Radiation Oncology

## 2015-05-02 ENCOUNTER — Telehealth: Payer: Self-pay | Admitting: Hematology

## 2015-05-02 VITALS — BP 113/65 | HR 70 | Temp 98.3°F | Resp 18 | Ht 63.0 in | Wt 196.6 lb

## 2015-05-02 DIAGNOSIS — R110 Nausea: Secondary | ICD-10-CM

## 2015-05-02 DIAGNOSIS — Z510 Encounter for antineoplastic radiation therapy: Secondary | ICD-10-CM | POA: Diagnosis not present

## 2015-05-02 DIAGNOSIS — C221 Intrahepatic bile duct carcinoma: Secondary | ICD-10-CM | POA: Diagnosis not present

## 2015-05-02 LAB — CBC & DIFF AND RETIC
BASO%: 0.1 % (ref 0.0–2.0)
Basophils Absolute: 0 10*3/uL (ref 0.0–0.1)
EOS%: 0.9 % (ref 0.0–7.0)
Eosinophils Absolute: 0.1 10*3/uL (ref 0.0–0.5)
HCT: 37.9 % (ref 34.8–46.6)
HGB: 12.2 g/dL (ref 11.6–15.9)
Immature Retic Fract: 5.9 % (ref 1.60–10.00)
LYMPH%: 9.7 % — ABNORMAL LOW (ref 14.0–49.7)
MCH: 28.2 pg (ref 25.1–34.0)
MCHC: 32.2 g/dL (ref 31.5–36.0)
MCV: 87.7 fL (ref 79.5–101.0)
MONO#: 0.5 10*3/uL (ref 0.1–0.9)
MONO%: 7.7 % (ref 0.0–14.0)
NEUT#: 5.6 10*3/uL (ref 1.5–6.5)
NEUT%: 81.6 % — ABNORMAL HIGH (ref 38.4–76.8)
Platelets: 156 10*3/uL (ref 145–400)
RBC: 4.32 10*6/uL (ref 3.70–5.45)
RDW: 14.3 % (ref 11.2–14.5)
Retic %: 1.2 % (ref 0.70–2.10)
Retic Ct Abs: 51.84 10*3/uL (ref 33.70–90.70)
WBC: 6.9 10*3/uL (ref 3.9–10.3)
lymph#: 0.7 10*3/uL — ABNORMAL LOW (ref 0.9–3.3)

## 2015-05-02 LAB — COMPREHENSIVE METABOLIC PANEL
ALT: 11 U/L (ref 0–55)
AST: 14 U/L (ref 5–34)
Albumin: 3.4 g/dL — ABNORMAL LOW (ref 3.5–5.0)
Alkaline Phosphatase: 86 U/L (ref 40–150)
Anion Gap: 8 mEq/L (ref 3–11)
BUN: 16.7 mg/dL (ref 7.0–26.0)
CO2: 25 mEq/L (ref 22–29)
Calcium: 9.4 mg/dL (ref 8.4–10.4)
Chloride: 101 mEq/L (ref 98–109)
Creatinine: 0.9 mg/dL (ref 0.6–1.1)
EGFR: 76 mL/min/{1.73_m2} — ABNORMAL LOW (ref >90)
Glucose: 211 mg/dl — ABNORMAL HIGH (ref 70–140)
Potassium: 3.9 mEq/L (ref 3.5–5.1)
Sodium: 135 mEq/L — ABNORMAL LOW (ref 136–145)
Total Bilirubin: <0.3 mg/dL (ref 0.20–1.20)
Total Protein: 7.5 g/dL (ref 6.4–8.3)

## 2015-05-02 NOTE — Telephone Encounter (Signed)
per pof to sch pt appt-pt stated will come by 1/25 to pick up sch she is here @ [facility] everyday

## 2015-05-03 ENCOUNTER — Ambulatory Visit
Admission: RE | Admit: 2015-05-03 | Discharge: 2015-05-03 | Disposition: A | Discharge status: Home / Self Care | Payer: Medicare HMO | Source: Ambulatory Visit | Attending: Radiation Oncology | Admitting: Radiation Oncology

## 2015-05-03 DIAGNOSIS — Z510 Encounter for antineoplastic radiation therapy: Secondary | ICD-10-CM | POA: Diagnosis not present

## 2015-05-04 ENCOUNTER — Ambulatory Visit
Admission: RE | Admit: 2015-05-04 | Discharge: 2015-05-04 | Disposition: A | Discharge status: Home / Self Care | Payer: Medicare HMO | Source: Ambulatory Visit | Attending: Radiation Oncology | Admitting: Radiation Oncology

## 2015-05-04 DIAGNOSIS — Z510 Encounter for antineoplastic radiation therapy: Secondary | ICD-10-CM | POA: Diagnosis not present

## 2015-05-05 ENCOUNTER — Ambulatory Visit
Admission: RE | Admit: 2015-05-05 | Discharge: 2015-05-05 | Disposition: A | Discharge status: Home / Self Care | Payer: Medicare HMO | Source: Ambulatory Visit | Attending: Radiation Oncology | Admitting: Radiation Oncology

## 2015-05-05 ENCOUNTER — Encounter: Payer: Self-pay | Admitting: Radiation Oncology

## 2015-05-05 VITALS — BP 100/50 | HR 60 | Temp 97.6°F | Ht 63.0 in | Wt 198.7 lb

## 2015-05-05 DIAGNOSIS — Z510 Encounter for antineoplastic radiation therapy: Secondary | ICD-10-CM | POA: Diagnosis not present

## 2015-05-05 DIAGNOSIS — C221 Intrahepatic bile duct carcinoma: Secondary | ICD-10-CM

## 2015-05-05 NOTE — Progress Notes (Signed)
Department of Radiation Oncology  Phone:  [phone] Fax:        [phone]  Weekly Treatment Note    Name: Bonnie Peterson Date: 05/05/2015 MRN: [id number] DOB: 01-Dec-1945   Diagnosis:  No diagnosis found.   Current dose: 25.2 Gy  Current fraction:14 out of 28   MEDICATIONS: Current Outpatient Prescriptions  Medication Sig Dispense Refill  . ACCU-CHEK AVIVA PLUS test strip 1 each by Other route daily.     [name] albuterol (PROAIR HFA) 108 (90 BASE) MCG/ACT inhaler INHALE 2 PUFFS INTO THE LUNGS EVERY 4 HOURS AS NEEDED FOR WHEEZING OR SHORTNESS OF BREATH 1 Inhaler 0  . alendronate (FOSAMAX) 70 MG tablet TAKE 1 TABLET  ONCE A WEEK. (Patient taking differently: TAKE 1 TABLET  ONCE A WEEK.--Sunday) 12 tablet 2  . amLODipine (NORVASC) 10 MG tablet Take 1 tablet (10 mg total) by mouth daily with breakfast. 90 tablet 3  . aspirin 81 MG chewable tablet Chew 81 mg by mouth daily. Reported on 04/28/2015    . atenolol (TENORMIN) 50 MG tablet Take 1 tablet (50 mg total) by mouth daily. 90 tablet 3  . baclofen (LIORESAL) 10 MG tablet Take 1 tablet (10 mg total) by mouth 3 (three) times daily as needed for muscle spasms. 90 each 11  . capecitabine (XELODA) 500 MG tablet Take 3 tablets (1,500 mg total) by mouth 2 (two) times daily after a meal. Monday through Friday while getting radiation therapy. 150 tablet 0  . cholecalciferol (VITAMIN D) 1000 UNITS tablet Take 1 tablet (1,000 Units total) by mouth daily. 30 tablet 11  . colchicine 0.6 MG tablet TAKE 1 TABLET DAILY AS NEEDED FOR GOUT FLARE 30 tablet 1  . diclofenac sodium (VOLTAREN) 1 % GEL Apply 2 g topically 4 (four) times daily as needed. (Patient taking differently: Apply 2 g topically 4 (four) times daily as needed (knee pain, elbow and hands). ) 100 g 4  . enalapril (VASOTEC) 20 MG tablet Take 1 tablet (20 mg total) by mouth daily with breakfast. 90 tablet 3  . fish oil-omega-3 fatty acids 1000 MG capsule Take 1 g by mouth every 30  (thirty) days.     . fluticasone (FLONASE) 50 MCG/ACT nasal spray Place 1 spray into the nose daily as needed for rhinitis.    . hyaluronate sodium (RADIAPLEXRX) GEL Apply 1 application topically 2 (two) times daily.    . Hyprom-Naphaz-Polysorb-Zn Sulf (CLEAR EYES COMPLETE OP) Place 1 drop into both eyes daily as needed (dry eyes).     [name] loratadine (CLARITIN) 10 MG tablet Take 1 tablet (10 mg total) by mouth daily as needed for allergies. 30 tablet 3  . metFORMIN (GLUCOPHAGE) 1000 MG tablet Take 1 tablet (1,000 mg total) by mouth 2 (two) times daily with a meal. Take 500mg  each morning, 1000mg  each night 180 tablet 3  . ondansetron (ZOFRAN) 8 MG tablet Take 1 tablet (8 mg total) by mouth every 8 (eight) hours as needed for nausea or vomiting. 30 tablet 3  . tiotropium (SPIRIVA) 18 MCG inhalation capsule Place 1 capsule (18 mcg total) into inhaler and inhale as needed (if its hot outside). 30 capsule 5  . traMADol (ULTRAM) 50 MG tablet Take 1 tablet (50 mg total) by mouth every 6 (six) hours as needed for moderate pain or severe pain. 30 tablet 0  . VITAMIN E PO Take 1 tablet by mouth every 30 (thirty) days.     . hydrochlorothiazide (HYDRODIURIL) 25 MG tablet Take  1 tablet (25 mg total) by mouth daily with breakfast. (Patient not taking: Reported on 05/05/2015) 90 tablet 3   No current facility-administered medications for this encounter.     ALLERGIES: Other   LABORATORY DATA:  Lab Results  Component Value Date   WBC 6.9 05/02/2015   HGB 12.2 05/02/2015   HCT 37.9 05/02/2015   MCV 87.7 05/02/2015   PLT 156 05/02/2015   Lab Results  Component Value Date   NA 135* 05/02/2015   K 3.9 05/02/2015   CL 99 04/14/2014   CO2 25 05/02/2015   Lab Results  Component Value Date   ALT 11 05/02/2015   AST 14 05/02/2015   ALKPHOS 86 05/02/2015   BILITOT <0.30 05/02/2015     NARRATIVE: Bonnie Peterson was seen today for weekly treatment management. The chart was checked and the patient's  films were reviewed.  On review of systems the patient reports that she has been doing very well overall. She is continuing to use her radial reflects cream, and states that she has been taking Zofran for nausea related to taking her Xeloda. She denies any difficulty with chest pain or shortness of breath. She denies any significant fatigue. She continues use tramadol as needed for pain of her right lower abdomen along her incision site. No other complaints or verbalized.  PHYSICAL EXAMINATION: height is 5\' 3"  (1.6 m) and weight is 198 lb 11.2 oz (90.13 kg). Her temperature is 97.6 F (36.4 C). Her blood pressure is 100/50 and her pulse is 60.       Pain scale 0/10  In general this is a well-appearing African-American female in no acute distress. She is alert and oriented times for examination. Abdominal exam reveals postsurgical changes of her upper abdomen consistent with most recent surgery. She has some hyper pigmentation of the skin without evidence of desquamation or ulceration. The abdomen has active bowel sounds in all quadrants and is soft, nontender, nondistended.  ASSESSMENT: Intrahepatic cholangiocarcinoma, currently receivingabdominal radiationwhen she seems to be tolerating.  PLAN: We will continue with the patient's radiation treatment as planned. The patient continues to clinically be very stable and is managing her side effects of Xeloda and radiation well. We will continue to follow her closely and encouraged her to call prior to her next visit next week if any new symptoms arise.   Jodelle Gross, MD

## 2015-05-05 NOTE — Progress Notes (Signed)
Bonnie Peterson is here for her 14th fraction to her Abdomen. She does complain of occasional pain to her lower right side. She will occasionally take tramadol for this which helps. The skin to her radiation site is intact, and she is using the radiaplex once daily. She does admit to some fatigue, and states she feels sleepy most of the time. She reports she eats better some days than others. She does have some nausea after taking her Xeloda, but has tried taking zofran with the Xeloda and states this has helped slightly. She reports she is constipated at times, and took some sennakot yesterday, and she has moved her bowels today. She denies any other problems.   BP 100/50 mmHg  Pulse 60  Temp(Src) 97.6 F (36.4 C)  Ht 5\' 3"  (1.6 m)  Wt 198 lb 11.2 oz (90.13 kg)  BMI 35.21 kg/m2   Wt Readings from Last 3 Encounters:  05/05/15 198 lb 11.2 oz (90.13 kg)  05/02/15 196 lb 9.6 oz (89.177 kg)  04/28/15 197 lb 8 oz (89.585 kg)

## 2015-05-05 NOTE — Patient Instructions (Signed)
Contact our office if you have any questions following today's appointment: [phone].  

## 2015-05-08 ENCOUNTER — Other Ambulatory Visit: Payer: Self-pay | Admitting: Obstetrics and Gynecology

## 2015-05-08 ENCOUNTER — Encounter: Payer: Self-pay | Admitting: *Deleted

## 2015-05-08 ENCOUNTER — Ambulatory Visit
Admission: RE | Admit: 2015-05-08 | Discharge: 2015-05-08 | Disposition: A | Discharge status: Home / Self Care | Payer: Medicare HMO | Source: Ambulatory Visit | Attending: Radiation Oncology | Admitting: Radiation Oncology

## 2015-05-08 DIAGNOSIS — Z510 Encounter for antineoplastic radiation therapy: Secondary | ICD-10-CM | POA: Diagnosis not present

## 2015-05-08 NOTE — Progress Notes (Signed)
Oncology Nurse Navigator Documentation  Oncology Nurse Navigator Flowsheets 05/08/2015  Navigator Location [facility]  Navigator Encounter Type Telephone  Telephone Outgoing Call;Patient Update  Treatment Initiated Date 04/18/2015  Patient Visit Type -  Treatment Phase Treatment: RT/Xeloda  Barriers/Navigation Needs No barriers at this time  Education -  Interventions -  Coordination of Care -  Education Method -  Support Groups/Services -  Acuity Level 1  Time Spent with Patient 15  Small sore in her mouth-told her to use warm water w/baking soda rinses and avoid hot or spicy or acidic foods/liquids. Call if worsens. No diarrhea or nausea. Eating fairly well-good days and not so good. Drinks about 4 glasses liquid/day-encouraged her to try to increase to 6/day (may help her BP also). She has lab that she thinks is her Hgb A1c from Dr. Irene Limbo with result at 7.8. Unable to see this in computer-encouraged her to bring the paper she has to next appointment for review.

## 2015-05-09 ENCOUNTER — Ambulatory Visit
Admission: RE | Admit: 2015-05-09 | Discharge: 2015-05-09 | Disposition: A | Discharge status: Home / Self Care | Payer: Medicare HMO | Source: Ambulatory Visit | Attending: Radiation Oncology | Admitting: Radiation Oncology

## 2015-05-09 DIAGNOSIS — Z510 Encounter for antineoplastic radiation therapy: Secondary | ICD-10-CM | POA: Diagnosis not present

## 2015-05-10 ENCOUNTER — Ambulatory Visit
Admission: RE | Admit: 2015-05-10 | Discharge: 2015-05-10 | Disposition: A | Discharge status: Home / Self Care | Payer: Medicare HMO | Source: Ambulatory Visit | Attending: Radiation Oncology | Admitting: Radiation Oncology

## 2015-05-10 ENCOUNTER — Encounter: Payer: Self-pay | Admitting: Radiation Oncology

## 2015-05-10 DIAGNOSIS — Z510 Encounter for antineoplastic radiation therapy: Secondary | ICD-10-CM | POA: Diagnosis not present

## 2015-05-11 ENCOUNTER — Ambulatory Visit
Admission: RE | Admit: 2015-05-11 | Discharge: 2015-05-11 | Disposition: A | Discharge status: Home / Self Care | Payer: Medicare HMO | Source: Ambulatory Visit | Attending: Radiation Oncology | Admitting: Radiation Oncology

## 2015-05-11 DIAGNOSIS — Z510 Encounter for antineoplastic radiation therapy: Secondary | ICD-10-CM | POA: Diagnosis not present

## 2015-05-12 ENCOUNTER — Ambulatory Visit
Admission: RE | Admit: 2015-05-12 | Discharge: 2015-05-12 | Disposition: A | Discharge status: Home / Self Care | Payer: Medicare HMO | Source: Ambulatory Visit | Attending: Radiation Oncology | Admitting: Radiation Oncology

## 2015-05-12 ENCOUNTER — Encounter: Payer: Self-pay | Admitting: Radiation Oncology

## 2015-05-12 VITALS — BP 124/60 | HR 62 | Temp 97.6°F | Resp 16 | Ht 63.0 in | Wt 197.0 lb

## 2015-05-12 DIAGNOSIS — C221 Intrahepatic bile duct carcinoma: Secondary | ICD-10-CM

## 2015-05-12 DIAGNOSIS — Z510 Encounter for antineoplastic radiation therapy: Secondary | ICD-10-CM | POA: Diagnosis not present

## 2015-05-12 NOTE — Progress Notes (Addendum)
Department of Radiation Oncology  Phone:  [phone] Fax:        [phone]  Weekly Treatment Note    Name: Bonnie Peterson Date: 05/12/2015 MRN: [id number] DOB: 20-May-1945   Diagnosis:  No diagnosis found.   Current dose: 34.2 Gy  Current fraction:19/28   MEDICATIONS: Current Outpatient Prescriptions  Medication Sig Dispense Refill  . ACCU-CHEK AVIVA PLUS test strip 1 each by Other route daily.     [name] albuterol (PROAIR HFA) 108 (90 BASE) MCG/ACT inhaler INHALE 2 PUFFS INTO THE LUNGS EVERY 4 HOURS AS NEEDED FOR WHEEZING OR SHORTNESS OF BREATH 1 Inhaler 0  . alendronate (FOSAMAX) 70 MG tablet TAKE 1 TABLET  ONCE A WEEK. (Patient taking differently: TAKE 1 TABLET  ONCE A WEEK.--Sunday) 12 tablet 2  . amLODipine (NORVASC) 10 MG tablet TAKE 1 TABLET (10 MG TOTAL) BY MOUTH DAILY WITH BREAKFAST. 90 tablet 3  . aspirin 81 MG chewable tablet Chew 81 mg by mouth daily. Reported on 04/28/2015    . atenolol (TENORMIN) 50 MG tablet Take 1 tablet (50 mg total) by mouth daily. 90 tablet 3  . capecitabine (XELODA) 500 MG tablet Take 3 tablets (1,500 mg total) by mouth 2 (two) times daily after a meal. Monday through Friday while getting radiation therapy. 150 tablet 0  . cholecalciferol (VITAMIN D) 1000 UNITS tablet Take 1 tablet (1,000 Units total) by mouth daily. 30 tablet 11  . colchicine 0.6 MG tablet TAKE 1 TABLET DAILY AS NEEDED FOR GOUT FLARE 30 tablet 1  . diclofenac sodium (VOLTAREN) 1 % GEL Apply 2 g topically 4 (four) times daily as needed. (Patient taking differently: Apply 2 g topically 4 (four) times daily as needed (knee pain, elbow and hands). ) 100 g 4  . enalapril (VASOTEC) 20 MG tablet Take 1 tablet (20 mg total) by mouth daily with breakfast. 90 tablet 3  . fish oil-omega-3 fatty acids 1000 MG capsule Take 1 g by mouth every 30 (thirty) days.     . fluticasone (FLONASE) 50 MCG/ACT nasal spray Place 1 spray into the nose daily as needed for rhinitis.    . hyaluronate sodium  (RADIAPLEXRX) GEL Apply 1 application topically 2 (two) times daily.    . Hyprom-Naphaz-Polysorb-Zn Sulf (CLEAR EYES COMPLETE OP) Place 1 drop into both eyes daily as needed (dry eyes).     [name] loratadine (CLARITIN) 10 MG tablet Take 1 tablet (10 mg total) by mouth daily as needed for allergies. 30 tablet 3  . metFORMIN (GLUCOPHAGE) 1000 MG tablet Take 1 tablet (1,000 mg total) by mouth 2 (two) times daily with a meal. Take 500mg  each morning, 1000mg  each night 180 tablet 3  . ondansetron (ZOFRAN) 8 MG tablet Take 1 tablet (8 mg total) by mouth every 8 (eight) hours as needed for nausea or vomiting. 30 tablet 3  . tiotropium (SPIRIVA) 18 MCG inhalation capsule Place 1 capsule (18 mcg total) into inhaler and inhale as needed (if its hot outside). 30 capsule 5  . traMADol (ULTRAM) 50 MG tablet Take 1 tablet (50 mg total) by mouth every 6 (six) hours as needed for moderate pain or severe pain. 30 tablet 0  . VITAMIN E PO Take 1 tablet by mouth every 30 (thirty) days.     . baclofen (LIORESAL) 10 MG tablet Take 1 tablet (10 mg total) by mouth 3 (three) times daily as needed for muscle spasms. (Patient not taking: Reported on 05/12/2015) 90 each 11  . hydrochlorothiazide (HYDRODIURIL) 25  MG tablet Take 1 tablet (25 mg total) by mouth daily with breakfast. (Patient not taking: Reported on 05/05/2015) 90 tablet 3   No current facility-administered medications for this encounter.     ALLERGIES: Other   LABORATORY DATA:  Lab Results  Component Value Date   WBC 6.9 05/02/2015   HGB 12.2 05/02/2015   HCT 37.9 05/02/2015   MCV 87.7 05/02/2015   PLT 156 05/02/2015   Lab Results  Component Value Date   NA 135* 05/02/2015   K 3.9 05/02/2015   CL 99 04/14/2014   CO2 25 05/02/2015   Lab Results  Component Value Date   ALT 11 05/02/2015   AST 14 05/02/2015   ALKPHOS 86 05/02/2015   BILITOT <0.30 05/02/2015     NARRATIVE: Bonnie Peterson was seen today for weekly treatment management. The chart  was checked and the patient's films were reviewed.  On review of systems the patient reports that she is doing well overall , and states that she is eating better this week last. She continues to  Take Zofran prior to her Xeloda. She is doing well with this. She's been is some hyperpigmentation along her right upper quadrant since last week but denies any itching or pain of the skin. No other complaints or verbalized.  PHYSICAL EXAMINATION: height is 5\' 3"  (1.6 m) and weight is 197 lb (89.359 kg). Her oral temperature is 97.6 F (36.4 C). Her blood pressure is 124/60 and her pulse is 62. Her respiration is 16.      Pain scale 0/10  In general this is a well-appearing African-American Female in no acute distress. She is alert and oriented 4 and appropriate throughout the examination. Cardiopulmonary reveals a normal effort without evidence of acute distress.  ASSESSMENT:   Intrahepatic cholangiocarcinoma,The patient is doing satisfactorily with treatment.  PLAN: We will continue with the patient's radiation treatment as planned.  She appears to be tolerating her treatment well and keep [place] informed me questions prior to her next visit.   Jodelle Gross, MD, PhD

## 2015-05-12 NOTE — Progress Notes (Signed)
Azaylea Demby has completed 19 fractions to her abdomen.  She denies having any pain today.  She reports her appetite is "off and on."  She is taking Xeloda.  She has started taking Zofran 30 minutes before taking Xeloda and says her nausea is better.  She denies having any bowel issues.  She has slight hyperpigmentation on her abdomen.  She is using radiaplex.  She reports feeling fatigued.  BP 124/60 mmHg  Pulse 62  Temp(Src) 97.6 F (36.4 C) (Oral)  Resp 16  Ht 5\' 3"  (1.6 m)  Wt 197 lb (89.359 kg)  BMI 34.91 kg/m2   Wt Readings from Last 3 Encounters:  05/12/15 197 lb (89.359 kg)  05/05/15 198 lb 11.2 oz (90.13 kg)  05/02/15 196 lb 9.6 oz (89.177 kg)

## 2015-05-12 NOTE — Patient Instructions (Signed)
Contact our office if you have any questions following today's appointment: [phone].

## 2015-05-15 ENCOUNTER — Ambulatory Visit
Admission: RE | Admit: 2015-05-15 | Discharge: 2015-05-15 | Disposition: A | Discharge status: Home / Self Care | Payer: Medicare HMO | Source: Ambulatory Visit | Attending: Radiation Oncology | Admitting: Radiation Oncology

## 2015-05-15 DIAGNOSIS — Z510 Encounter for antineoplastic radiation therapy: Secondary | ICD-10-CM | POA: Diagnosis not present

## 2015-05-16 ENCOUNTER — Ambulatory Visit
Admission: RE | Admit: 2015-05-16 | Discharge: 2015-05-16 | Disposition: A | Discharge status: Home / Self Care | Payer: Medicare HMO | Source: Ambulatory Visit | Attending: Radiation Oncology | Admitting: Radiation Oncology

## 2015-05-16 DIAGNOSIS — Z510 Encounter for antineoplastic radiation therapy: Secondary | ICD-10-CM | POA: Diagnosis not present

## 2015-05-17 ENCOUNTER — Ambulatory Visit: Payer: Medicare HMO

## 2015-05-17 ENCOUNTER — Ambulatory Visit
Admission: RE | Admit: 2015-05-17 | Discharge: 2015-05-17 | Disposition: A | Discharge status: Home / Self Care | Payer: Medicare HMO | Source: Ambulatory Visit | Attending: Radiation Oncology | Admitting: Radiation Oncology

## 2015-05-17 ENCOUNTER — Other Ambulatory Visit (HOSPITAL_BASED_OUTPATIENT_CLINIC_OR_DEPARTMENT_OTHER): Payer: Medicare HMO

## 2015-05-17 ENCOUNTER — Other Ambulatory Visit: Payer: Self-pay | Admitting: *Deleted

## 2015-05-17 ENCOUNTER — Ambulatory Visit (HOSPITAL_BASED_OUTPATIENT_CLINIC_OR_DEPARTMENT_OTHER): Payer: Medicare HMO | Admitting: Hematology

## 2015-05-17 ENCOUNTER — Encounter: Payer: Self-pay | Admitting: Hematology

## 2015-05-17 ENCOUNTER — Telehealth: Payer: Self-pay | Admitting: Hematology

## 2015-05-17 ENCOUNTER — Encounter: Payer: Self-pay | Admitting: *Deleted

## 2015-05-17 VITALS — BP 107/61 | HR 81 | Temp 98.2°F | Resp 16 | Ht 63.0 in | Wt 192.9 lb

## 2015-05-17 DIAGNOSIS — L738 Other specified follicular disorders: Secondary | ICD-10-CM | POA: Diagnosis not present

## 2015-05-17 DIAGNOSIS — C221 Intrahepatic bile duct carcinoma: Secondary | ICD-10-CM

## 2015-05-17 DIAGNOSIS — Z510 Encounter for antineoplastic radiation therapy: Secondary | ICD-10-CM | POA: Diagnosis not present

## 2015-05-17 LAB — CBC & DIFF AND RETIC
BASO%: 0.2 % (ref 0.0–2.0)
Basophils Absolute: 0 10*3/uL (ref 0.0–0.1)
EOS%: 0.7 % (ref 0.0–7.0)
Eosinophils Absolute: 0 10*3/uL (ref 0.0–0.5)
HCT: 39.3 % (ref 34.8–46.6)
HGB: 12.6 g/dL (ref 11.6–15.9)
Immature Retic Fract: 9.5 % (ref 1.60–10.00)
LYMPH%: 12.8 % — ABNORMAL LOW (ref 14.0–49.7)
MCH: 28.1 pg (ref 25.1–34.0)
MCHC: 32.1 g/dL (ref 31.5–36.0)
MCV: 87.7 fL (ref 79.5–101.0)
MONO#: 0.3 10*3/uL (ref 0.1–0.9)
MONO%: 5.6 % (ref 0.0–14.0)
NEUT#: 4.4 10*3/uL (ref 1.5–6.5)
NEUT%: 80.7 % — ABNORMAL HIGH (ref 38.4–76.8)
Platelets: 103 10*3/uL — ABNORMAL LOW (ref 145–400)
RBC: 4.48 10*6/uL (ref 3.70–5.45)
RDW: 16.2 % — ABNORMAL HIGH (ref 11.2–14.5)
Retic %: 1.73 % (ref 0.70–2.10)
Retic Ct Abs: 77.5 10*3/uL (ref 33.70–90.70)
WBC: 5.4 10*3/uL (ref 3.9–10.3)
lymph#: 0.7 10*3/uL — ABNORMAL LOW (ref 0.9–3.3)
nRBC: 0 % (ref 0–0)

## 2015-05-17 LAB — COMPREHENSIVE METABOLIC PANEL
ALT: 13 U/L (ref 0–55)
AST: 14 U/L (ref 5–34)
Albumin: 3.4 g/dL — ABNORMAL LOW (ref 3.5–5.0)
Alkaline Phosphatase: 81 U/L (ref 40–150)
Anion Gap: 10 mEq/L (ref 3–11)
BUN: 11.2 mg/dL (ref 7.0–26.0)
CO2: 25 mEq/L (ref 22–29)
Calcium: 9.7 mg/dL (ref 8.4–10.4)
Chloride: 103 mEq/L (ref 98–109)
Creatinine: 0.8 mg/dL (ref 0.6–1.1)
EGFR: 82 mL/min/{1.73_m2} — ABNORMAL LOW (ref >90)
Glucose: 217 mg/dl — ABNORMAL HIGH (ref 70–140)
Potassium: 4.3 mEq/L (ref 3.5–5.1)
Sodium: 138 mEq/L (ref 136–145)
Total Bilirubin: 0.51 mg/dL (ref 0.20–1.20)
Total Protein: 7.3 g/dL (ref 6.4–8.3)

## 2015-05-17 MED ORDER — CAPECITABINE 500 MG PO TABS: 850.0000 mg/m2 | ORAL_TABLET | Freq: Two times a day (BID) | ORAL | Status: DC

## 2015-05-17 MED ORDER — CLINDAMYCIN PHOSPHATE 1 % EX GEL: Freq: Two times a day (BID) | CUTANEOUS | Status: AC

## 2015-05-17 MED ORDER — ONDANSETRON HCL 8 MG PO TABS: 8.0000 mg | ORAL_TABLET | Freq: Three times a day (TID) | ORAL | Status: DC | PRN

## 2015-05-17 MED FILL — CLINDAMYCIN PH 1% GEL: 1 | 10 days supply | Qty: 30 | Fill #0

## 2015-05-17 MED FILL — CAPECITABINE 500 MG TABLET: 500 | 30 days supply | Qty: 120 | Fill #0

## 2015-05-17 MED FILL — ONDANSETRON HCL 8 MG TABLET: 8 | 10 days supply | Qty: 30 | Fill #0

## 2015-05-17 NOTE — Telephone Encounter (Signed)
per pof to sch pt appt-cld & spoke to pt and gave pt copy of avs

## 2015-05-17 NOTE — Progress Notes (Signed)
Oncology Nurse Navigator Documentation  Oncology Nurse Navigator Flowsheets 05/17/2015  Navigator Location [facility]  Navigator Encounter Type Follow-up Appt  Telephone -  Treatment Initiated Date -  Patient Visit Type MedOnc;Follow-up  Treatment Phase Treatment--Xeloda and RT (last tx 2/16)  Barriers/Navigation Needs Family concerns-symptom management; CT scan  Education -  Interventions Coordination of Care;Other--message to [facility] to inquire if she needs her scan there in April or can it be done in [place] (patient preference); encouraged her to discuss her heartburn and anal discomfort to MD.   Coordination of Care Radiology  Education Method -  Support Groups/Services -  Acuity Level 2  Time Spent with Patient 2  Explained to patient that she needs to be up front with Dr. Netty Starring sore can become significant infection when on chemotherapy. Left VM on triage line asking about having scan in [place] and confirming it is C/A/P with contrast.

## 2015-05-18 ENCOUNTER — Ambulatory Visit
Admission: RE | Admit: 2015-05-18 | Discharge: 2015-05-18 | Disposition: A | Discharge status: Home / Self Care | Payer: Medicare HMO | Source: Ambulatory Visit | Attending: Radiation Oncology | Admitting: Radiation Oncology

## 2015-05-18 ENCOUNTER — Ambulatory Visit: Payer: Medicare HMO

## 2015-05-18 DIAGNOSIS — Z510 Encounter for antineoplastic radiation therapy: Secondary | ICD-10-CM | POA: Diagnosis not present

## 2015-05-19 ENCOUNTER — Ambulatory Visit
Admission: RE | Admit: 2015-05-19 | Discharge: 2015-05-19 | Disposition: A | Discharge status: Home / Self Care | Payer: Medicare HMO | Source: Ambulatory Visit | Attending: Radiation Oncology | Admitting: Radiation Oncology

## 2015-05-19 ENCOUNTER — Ambulatory Visit: Payer: Medicare HMO

## 2015-05-19 ENCOUNTER — Encounter: Payer: Self-pay | Admitting: Radiation Oncology

## 2015-05-19 VITALS — BP 105/50 | HR 76 | Temp 97.6°F | Ht 63.0 in | Wt 191.5 lb

## 2015-05-19 DIAGNOSIS — Z510 Encounter for antineoplastic radiation therapy: Secondary | ICD-10-CM | POA: Diagnosis not present

## 2015-05-19 DIAGNOSIS — C221 Intrahepatic bile duct carcinoma: Secondary | ICD-10-CM

## 2015-05-19 NOTE — Progress Notes (Signed)
Department of Radiation Oncology  Phone:  [phone] Fax:        [phone]  Weekly Treatment Note    Name: Bonnie Peterson Date: 05/19/2015 MRN: [id number] DOB: [birth year]   Diagnosis:     ICD-9-CM ICD-10-CM   1. Intrahepatic cholangiocarcinoma (HCC) 155.1 C22.1      Current dose: 43.2 Gy  Current fraction: 24   MEDICATIONS: Current Outpatient Prescriptions  Medication Sig Dispense Refill  . ACCU-CHEK AVIVA PLUS test strip 1 each by Other route daily.     [name] albuterol (PROAIR HFA) 108 (90 BASE) MCG/ACT inhaler INHALE 2 PUFFS INTO THE LUNGS EVERY 4 HOURS AS NEEDED FOR WHEEZING OR SHORTNESS OF BREATH 1 Inhaler 0  . alendronate (FOSAMAX) 70 MG tablet TAKE 1 TABLET  ONCE A WEEK. (Patient taking differently: TAKE 1 TABLET  ONCE A WEEK.--Sunday) 12 tablet 2  . amLODipine (NORVASC) 10 MG tablet TAKE 1 TABLET (10 MG TOTAL) BY MOUTH DAILY WITH BREAKFAST. 90 tablet 3  . aspirin 81 MG chewable tablet Chew 81 mg by mouth daily. Reported on 04/28/2015    . atenolol (TENORMIN) 50 MG tablet Take 1 tablet (50 mg total) by mouth daily. 90 tablet 3  . capecitabine (XELODA) 500 MG tablet Take 3 tablets (1,500 mg total) by mouth 2 (two) times daily after a meal. Monday through Friday while getting radiation therapy. 48 tablet 0  . cholecalciferol (VITAMIN D) 1000 UNITS tablet Take 1 tablet (1,000 Units total) by mouth daily. 30 tablet 11  . clindamycin (CLINDAGEL) 1 % gel Apply topically 2 (two) times daily. To boils in the peri-rectal area 30 g 0  . diclofenac sodium (VOLTAREN) 1 % GEL Apply 2 g topically 4 (four) times daily as needed. (Patient taking differently: Apply 2 g topically 4 (four) times daily as needed (knee pain, elbow and hands). ) 100 g 4  . enalapril (VASOTEC) 20 MG tablet Take 1 tablet (20 mg total) by mouth daily with breakfast. 90 tablet 3  . fish oil-omega-3 fatty acids 1000 MG capsule Take 1 g by mouth every 30 (thirty) days.     . fluticasone (FLONASE) 50 MCG/ACT nasal  spray Place 1 spray into the nose daily as needed for rhinitis.    . hyaluronate sodium (RADIAPLEXRX) GEL Apply 1 application topically 2 (two) times daily.    . Hyprom-Naphaz-Polysorb-Zn Sulf (CLEAR EYES COMPLETE OP) Place 1 drop into both eyes daily as needed (dry eyes).     [name] loratadine (CLARITIN) 10 MG tablet Take 1 tablet (10 mg total) by mouth daily as needed for allergies. 30 tablet 3  . metFORMIN (GLUCOPHAGE) 1000 MG tablet Take 1 tablet (1,000 mg total) by mouth 2 (two) times daily with a meal. Take 500mg  each morning, 1000mg  each night 180 tablet 3  . ondansetron (ZOFRAN) 8 MG tablet Take 1 tablet (8 mg total) by mouth every 8 (eight) hours as needed for nausea or vomiting. 30 tablet 1  . tiotropium (SPIRIVA) 18 MCG inhalation capsule Place 1 capsule (18 mcg total) into inhaler and inhale as needed (if its hot outside). 30 capsule 5  . traMADol (ULTRAM) 50 MG tablet Take 1 tablet (50 mg total) by mouth every 6 (six) hours as needed for moderate pain or severe pain. 30 tablet 0  . VITAMIN E PO Take 1 tablet by mouth every 30 (thirty) days.     . baclofen (LIORESAL) 10 MG tablet Take 1 tablet (10 mg total) by mouth 3 (three) times daily as  needed for muscle spasms. (Patient not taking: Reported on 05/12/2015) 90 each 11  . colchicine 0.6 MG tablet TAKE 1 TABLET DAILY AS NEEDED FOR GOUT FLARE (Patient not taking: Reported on 05/19/2015) 30 tablet 1   No current facility-administered medications for this encounter.     ALLERGIES: Other   LABORATORY DATA:  Lab Results  Component Value Date   WBC 5.4 05/17/2015   HGB 12.6 05/17/2015   HCT 39.3 05/17/2015   MCV 87.7 05/17/2015   PLT 103* 05/17/2015   Lab Results  Component Value Date   NA 138 05/17/2015   K 4.3 05/17/2015   CL 99 04/14/2014   CO2 25 05/17/2015   Lab Results  Component Value Date   ALT 13 05/17/2015   AST 14 05/17/2015   ALKPHOS 81 05/17/2015   BILITOT 0.51 05/17/2015     NARRATIVE: Bonnie Peterson was seen  today for weekly treatment management. The chart was checked and the patient's films were reviewed.  Bonnie Peterson has received 24 fractions to her abdomen.  Denies pain or discomfort today.  Skin to abdominal with normal color, using Radiaplex bid as instructed.  No change in bowel and bladder patterns.  Appetite has decreased over the past three weeks.  Energy level is less; has energy in the morning and it decrease as the day goes on, she tries to take a nap at 2 p.m. Everyday.  One month F/U visit card given for 06-22-15  At 445 p.pm. BP 105/50 mmHg  Pulse 76  Temp(Src) 97.6 F (36.4 C) (Oral)  Ht 5\' 3"  (1.6 m)  Wt 191 lb 8 oz (86.864 kg)  BMI 33.93 kg/m2  SpO2 99%  PHYSICAL EXAMINATION: height is 5\' 3"  (1.6 m) and weight is 191 lb 8 oz (86.864 kg). Her oral temperature is 97.6 F (36.4 C). Her blood pressure is 105/50 and her pulse is 76. Her oxygen saturation is 99%.        ASSESSMENT: The patient is doing satisfactorily with treatment.  PLAN: We will continue with the patient's radiation treatment as planned. The patient is doing well overall and I will see her next week near her final treatment.

## 2015-05-19 NOTE — Progress Notes (Addendum)
Bonnie Peterson has received 24 fractions to her abdomen.  Denies pain or discomfort today.  Skin to abdominal with normal color, using Radiaplex bid as instructed.  No change in bowel and bladder patterns.  Appetite has decreased over the past three weeks.  Energy level is less; has energy in the morning and it decrease as the day goes on, she tries to take a nap at 2 p.m. Everyday.  One month F/U visit card given for 06-22-15  At 445 p.pm. BP 105/50 mmHg  Pulse 76  Temp(Src) 97.6 F (36.4 C) (Oral)  Ht 5' 3"  (1.6 m)  Wt 191 lb 8 oz (86.864 kg)  BMI 33.93 kg/m2  SpO2 99%

## 2015-05-19 NOTE — Progress Notes (Signed)
  Radiation Oncology         [phone] ________________________________  Name: Bonnie Peterson MRN: [id number]  Date: 05/10/2015  DOB: [birth year]  COMPLEX SIMULATION  NOTE  Diagnosis:  Intrahepatic cholangiocarcinoma   Narrative The patient has initially been planned to receive a course of radiation treatment to a dose of 45 gray in 25 fractions at 1.8 gray per fraction. The patient will now receive a boost to the high risk target volume for an additional 5.4 gray. This will be delivered in 3 fractions at 1.8 gray per fraction and a cone down boost technique will be utilized. To accomplish this, an additional 4 customized blocks have been designed for this purpose. A complex isodose plan is requested to ensure that the high-risk target region receives the appropriate radiation dose and that the nearby normal structures continue to be appropriately spared. The patient's final total dose therefore will be 50.4 gray.   ________________________________ ------------------------------------------------  Jodelle Gross, MD, PhD

## 2015-05-21 DIAGNOSIS — R110 Nausea: Secondary | ICD-10-CM | POA: Insufficient documentation

## 2015-05-21 NOTE — Progress Notes (Signed)
[name]    HEMATOLOGY/ONCOLOGY CLINIC NOTE  Date of Service: .05/17/2015   Patient Care Team: Aquilla Hacker, MD as PCP - General  CHIEF COMPLAINTS/PURPOSE OF CONSULTATION:   F/u for Cholangiocarcinoma  DIAGNOSIS  Stage IVA (T2a, pN1, M0) Intrahepatic Cholangiocarcinoma  Current Treatment  -Concurrent Capecitabine 842m/m2 po BID M-F while on concurrent RT Final date of RT planned for 05/25/2015  Previous treatment 02/09/15 L lobe of liver partial hepatectomy segments 2, 3, 4a, 4b invasive cholangiocarcinoma, moderately differentiated. Tumor focally extends to the parenchymal margin of resection. Uninvolved liver parenchyma with steatohepatitis and associated periportal and centrilobular pericellular fibrosis. Scattered von Meyenburg complexes. One hepatic artery LN with metastatic cholangiocarcinoma. Fibroadipose tissue and nerve negative for malignancy. Gallbladder negative for malignancy. One LN negative for malignancy. T2aN1 invasive cholangiocarcinoma, moderately differentiated, with positive hepatic parenchymal margin, and 1/2 nodes positive for tumor involvement.    Interval History  Ms MMiranteis here for her schedule followup to monitor her concurrent chemoradiation treatment. She is here with her daughter. Notes some grade 1-2 fatigue. Mild abdominal discomfort. No significant nausea. Some dry skin over the palms without much redness of discomfort - recommended the use of udder cream. No fevers/chills. Notes that she cant wait to be done. Notes some element of anxiety and moodiness.  MEDICAL HISTORY:  Past Medical History  Diagnosis Date  . Obstructive sleep apnea   . HTN, goal below 130/80   . HLD (hyperlipidemia)   . Benign positional vertigo   . Depression   . Anxiety   . Irritable bowel syndrome   . Obesity, Class III, BMI 40-49.9 (morbid obesity) ([place]   . Gout   . Restrictive lung disease   . Echocardiogram findings abnormal, without diagnosis 10/10    10/10:  mild pulm HTN, EF 60-65%, mild LVH, moderate aortic regurg  . Early cataracts, bilateral 10/13    Optho, Dr SGershon Crane . [place]   . [place]    intrahepatic cholangiocarcinoma  . Osteoarthritis (arthritis due to wear and tear of joints)     also gout   SURGICAL HISTORY: Past Surgical History  Procedure Laterality Date  . Partial hysterectomy    . Rotator cuff repair        L rotator cuff repair 11/07-[facility] - 12/7/200  . Tonsillectomy    . Liver biopsy    . Abdominal hysterectomy    . Open partial hepatectomy [83] Left 02/09/15    SOCIAL HISTORY: Social History   Social History  . Marital Status: Single    Spouse Name: N/A  . Number of Children: 4  . Years of Education: N/A   Occupational History  . unemployed    Social History Main Topics  . Smoking status: Current Some Day Smoker -- 0.50 packs/day for 40 years    Types: Cigarettes  . Smokeless tobacco: Never Used  . Alcohol Use: 0.0 oz/week    0 Standard drinks or equivalent per week     Comment: occasionally  . Drug Use: No  . Sexual Activity: Not on file   Other Topics Concern  . 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 Breast cancer Mother   . Hypertension Mother   . Coronary artery disease Mother   . Diabetes type II Sister   . Pancreatic cancer Sister   . Diabetes type II Mother     ALLERGIES:  is allergic to other.  MEDICATIONS:  Current Outpatient Prescriptions  Medication Sig Dispense Refill  . ACCU-CHEK AVIVA PLUS test strip 1 each by Other route daily.     [name] albuterol  (PROAIR HFA) 108 (90 BASE) MCG/ACT inhaler INHALE 2 PUFFS INTO THE LUNGS EVERY 4 HOURS AS NEEDED FOR WHEEZING OR SHORTNESS OF BREATH 1 Inhaler 0  . alendronate (FOSAMAX) 70 MG tablet TAKE 1 TABLET  ONCE A WEEK. (Patient taking differently: TAKE 1 TABLET  ONCE A WEEK.--Sunday) 12 tablet 2  . amLODipine (NORVASC) 10 MG tablet TAKE 1 TABLET (10 MG TOTAL) BY MOUTH DAILY WITH BREAKFAST. 90 tablet 3  . aspirin 81 MG chewable tablet Chew 81 mg by mouth daily. Reported on 04/28/2015    . atenolol (TENORMIN) 50 MG tablet Take 1 tablet (50 mg total) by mouth daily. 90 tablet 3  . baclofen (LIORESAL) 10 MG tablet Take 1 tablet (10 mg total) by mouth 3 (three) times daily as needed for muscle spasms. (Patient not taking: Reported on 05/12/2015) 90 each 11  . capecitabine (XELODA) 500 MG tablet Take 3 tablets (1,500 mg total) by mouth 2 (two) times daily after a meal. Monday through Friday while getting radiation therapy. 48 tablet 0  . cholecalciferol (VITAMIN D) 1000 UNITS tablet Take 1 tablet (1,000 Units total) by mouth daily. 30 tablet 11  . clindamycin (CLINDAGEL) 1 % gel Apply topically 2 (two) times daily. To boils in the peri-rectal area 30 g 0  . colchicine 0.6 MG tablet TAKE 1 TABLET DAILY AS NEEDED FOR GOUT FLARE (Patient not taking: Reported on 05/19/2015) 30 tablet 1  . diclofenac sodium (VOLTAREN) 1 % GEL Apply 2 g topically 4 (four) times daily as needed. (Patient taking differently: Apply 2 g topically 4 (four) times daily as needed (knee pain, elbow and hands). ) 100 g 4  . enalapril (VASOTEC) 20 MG tablet Take 1 tablet (20 mg total) by mouth daily with breakfast. 90 tablet 3  . fish oil-omega-3 fatty acids 1000 MG capsule Take 1 g by mouth every 30 (thirty) days.     . fluticasone (FLONASE) 50 MCG/ACT nasal spray Place 1 spray into the nose daily as needed for rhinitis.    . hyaluronate sodium (RADIAPLEXRX) GEL Apply 1 application topically 2 (two) times daily.    . Hyprom-Naphaz-Polysorb-Zn Sulf  (CLEAR EYES COMPLETE OP) Place 1 drop into both eyes daily as needed (dry eyes).     [name] loratadine (CLARITIN) 10 MG tablet Take 1 tablet (10 mg total) by mouth daily as needed for allergies. 30 tablet 3  . metFORMIN (GLUCOPHAGE) 1000 MG tablet Take 1 tablet (1,000 mg total) by mouth 2 (two) times daily with a meal. Take 575m each morning, 10016meach night 180 tablet 3  . ondansetron (ZOFRAN) 8 MG tablet Take 1 tablet (8 mg total) by mouth every 8 (eight) hours as needed for nausea or vomiting. 30 tablet 1  . tiotropium (SPIRIVA) 18 MCG inhalation capsule Place 1 capsule (18 mcg total) into inhaler and inhale as needed (if its hot outside). 30 capsule 5  . traMADol (ULTRAM) 50 MG tablet  Take 1 tablet (50 mg total) by mouth every 6 (six) hours as needed for moderate pain or severe pain. 30 tablet 0  . VITAMIN E PO Take 1 tablet by mouth every 30 (thirty) days.      No current facility-administered medications for this visit.    REVIEW OF SYSTEMS:    10 Point review of Systems was done is negative except as noted above.  PHYSICAL EXAMINATION: ECOG PERFORMANCE STATUS: 1 - Symptomatic but completely ambulatory  . Filed Vitals:   05/17/15 1024  BP: 107/61  Pulse: 81  Temp: 98.2 F (36.8 C)  Resp: 16   Filed Weights   05/17/15 1024  Weight: 192 lb 14.4 oz (87.499 kg)   .Body mass index is 34.18 kg/(m^2).  GENERAL:alert, in no acute distress and comfortable SKIN: skin color, texture, turgor are normal, no rashes or significant lesions EYES: normal, conjunctiva are pink and non-injected, sclera clear OROPHARYNX:no exudate, no erythema and lips, buccal mucosa, and tongue normal  NECK: supple, no JVD, thyroid normal size, non-tender, without nodularity LYMPH:  no palpable lymphadenopathy in the cervical, axillary or inguinal LUNGS: clear to auscultation with normal respiratory effort HEART: regular rate & rhythm,  no murmurs and no lower extremity edema ABDOMEN: abdomen obese, soft,  non-tender, normoactive bowel sounds , healed surgical wounds Musculoskeletal: no cyanosis of digits and no clubbing  PSYCH: alert & oriented x 3 with fluent speech NEURO: no focal motor/sensory deficits  LABORATORY DATA:  . CBC Latest Ref Rng 05/17/2015 05/02/2015 04/12/2015  WBC 3.9 - 10.3 10e3/uL 5.4 6.9 10.0  Hemoglobin 11.6 - 15.9 g/dL 12.6 12.2 12.6  Hematocrit 34.8 - 46.6 % 39.3 37.9 40.0  Platelets 145 - 400 10e3/uL 103(L) 156 212   . CMP Latest Ref Rng 05/17/2015 05/02/2015 04/12/2015  Glucose 70 - 140 mg/dl 217(H) 211(H) 175(H)  BUN 7.0 - 26.0 mg/dL 11.2 16.7 13.4  Creatinine 0.6 - 1.1 mg/dL 0.8 0.9 0.9  Sodium 136 - 145 mEq/L 138 135(L) 138  Potassium 3.5 - 5.1 mEq/L 4.3 3.9 4.0  Chloride 96 - 112 mEq/L - - -  CO2 22 - 29 mEq/L 25 25 28   Calcium 8.4 - 10.4 mg/dL 9.7 9.4 10.7(H)  Total Protein 6.4 - 8.3 g/dL 7.3 7.5 8.4(H)  Total Bilirubin 0.20 - 1.20 mg/dL 0.51 <0.30 0.35  Alkaline Phos 40 - 150 U/L 81 86 134  AST 5 - 34 U/L 14 14 19   ALT 0 - 55 U/L 13 11 28        .  RADIOGRAPHIC STUDIES: I have personally reviewed the radiological images as listed and agreed with the findings in the report. No results found.   CT chest/abd/pelvis 04/04/2015 (at [facility]) Findings:  Chest:  Heart and great vessels appear normal. Lipomatous hypertrophy of the intra-atrial septum noted. No pericardial effusion, thickening or calcifications. The central pulmonary arteries are patent and normal in caliber. The central airways are patent. The thyroid is normal. The esophagus demonstrates no focal abnormality.  No mediastinal, hilar, supraclavicular, axillary, internal mammary or cardiophrenic adenopathy. Scattered subcentimeter mediastinal lymph nodes are unchanged.   Diffuse mosaic lung attenuation and subsegmental atelectasis noted. The lungs are otherwise clear. Stable 2-3 mm nodule in the left upper lobe (5/33) and a peripheral 6 mm right lower lobe nodule (image 52). No  new pulmonary nodules. No pleural effusions.   Abdomen and Pelvis: No free intraperitoneal air or fluid.  The major intra-abdominal and pelvic vessels are patent. Scattered aortoiliac atherosclerotic disease noted, without  aneurysm.  Interval left hepatectomy noted, without residual enhancing tumor at the lateral resection margin. No focal hepatic lesions are identified. No intra or extrahepatic biliary ductal dilatation. Gallbladder is surgically absent.  Spleen size normal. Incidental 5 mm low-attenuation lesion in the superior spleen (image 79). The pancreas appears normal. No pancreatic ductal dilatation. The adrenals are normal. The kidneys appear morphologically normal. No hydronephrosis or urolithiasis. Scattered subcentimeter low-attenuation lesions in right renal cortex are too small to characterize.  The small and large bowel demonstrate normal wall thickness and caliber. Incidentally noted fat density polypoid lesion in the sigmoid colon, likely a colonic lipoma. Appendix normal.  Stable 1.3 cm short axis porta hepatis lymph node (image 104). No additional mesenteric or retroperitoneal adenopathy.  The urinary bladder appears unremarkable. The uterus is surgically absent.  No suspicious focal osseous lesions.    Impression: Interval left hepatectomy. No evidence of local disease recurrence or metastatic disease in the chest, abdomen or pelvis. Subcentimeter bilateral pulmonary nodules are unchanged.  Electronically Reviewed by: Erlene Senters, MD    ASSESSMENT & PLAN:   70 yo with multiple medical co-morbids  1) Intrahepatic invasive cholangiocarcinoma T2a N1 M0 (Stage IVA) with focal positive parenchymal margins and 1/2 LN positive. CT C/A/P on 04/03/2016 showed no evidence of cholangiocarcinoma local recurrence or metastases at this time. Patient tolerating concurrent chemo-radiation quite well thus far with no overt prohibitive toxicities.  2) grade 2  fatigue and grade 1-2 anorexia  3) Mild thrombocytopenia PLT 103K likely due to chemotherapy. Plan --continue concurrent Capecitabine with radiation therapy. Had refill provided for the Capecitabine to make it through to the end of RT. -prn zofran for nausea. --maintain good po food and fluid intake  4) hypercalcemia -resolved.  5)  Patient Active Problem List   Diagnosis Date Noted  . Hypercalcemia 05/21/2015  . Nausea without vomiting 05/21/2015  . Bacterial folliculitis 78/58/8502  . Intrahepatic cholangiocarcinoma ([place]) 03/27/2015  . Inadequate sleep hygiene 08/22/2014  . 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 IBS (irritable bowel syndrome) 03/22/2013  . Osteoporosis, unspecified 10/15/2012  . Vitamin D deficiency 12/23/2011  . Tobacco abuse 10/12/2010  . COPD (chronic obstructive pulmonary disease) ([place]) 06/14/2010  . Obstructive sleep apnea 04/27/2009  . OBESITY, UNSPECIFIED 04/26/2009  . DYSLIPIDEMIA 11/25/2007  . GOUT NOS 12/24/2006  . Diabetes mellitus, type 2 ([place]) 06/05/2006  . DEPRESSION, MAJOR, RECURRENT 06/05/2006  . ANXIETY 06/05/2006  . HYPERTENSION, BENIGN SYSTEMIC 06/05/2006  . IRRITABLE BOWEL SYNDROME 06/05/2006  . OSTEOARTHRITIS, MULTI SITES 06/05/2006   Plan -continue f/u with PCP for the ongoing management of other medical issues  RTC with Dr Irene Limbo in 2 weeks with cbc, cmp earlier if any new acute concerns.  I spent 15 minutes counseling the patient face to face. The total time spent in the appointment was 20 minutes and more than 50% was on counseling and direct patient cares.    Sullivan Lone MD [place] AAHIVMS [facility] Hematology/Oncology Physician [place]  (Office):       [phone] (Work cell):  [phone] (Fax):           [phone]

## 2015-05-21 NOTE — Progress Notes (Signed)
Bonnie Peterson    HEMATOLOGY/ONCOLOGY CLINIC NOTE  Date of Service: 05/02/2015    Patient Care Team: Aquilla Hacker, MD as PCP - General  CHIEF COMPLAINTS/PURPOSE OF CONSULTATION:   F/u for Cholangiocarcinoma  DIAGNOSIS  Stage IVA (T2a, pN1, M0) Intrahepatic Cholangiocarcinoma  Current Treatment  -Concurrent Capecitabine 861m/m2 po BID M-F while on concurrent RT  Previous treatment 02/09/15 L lobe of liver partial hepatectomy segments 2, 3, 4a, 4b invasive cholangiocarcinoma, moderately differentiated. Tumor focally extends to the parenchymal margin of resection. Uninvolved liver parenchyma with steatohepatitis and associated periportal and centrilobular pericellular fibrosis. Scattered von Meyenburg complexes. One hepatic artery LN with metastatic cholangiocarcinoma. Fibroadipose tissue and nerve negative for malignancy. Gallbladder negative for malignancy. One LN negative for malignancy. T2aN1 invasive cholangiocarcinoma, moderately differentiated, with positive hepatic parenchymal margin, and 1/2 nodes positive for tumor involvement.  Final date of RT planned for 05/25/2015  Interval History  Bonnie Peterson here for her schedule followup. She has been compliant with her concurrent Xeloda + RT. Notes some loose stools but does not describe it as diarrhea. Had some nausea with the Xeloda but has been controlled with the use of zofran. Notes that she had some hiccups last week but these have resolved now. No fevers/chills. No PPE.  MEDICAL HISTORY:  Past Medical History  Diagnosis Date  . Obstructive sleep apnea   . HTN, goal below 130/80   . HLD (hyperlipidemia)   . Benign positional vertigo   . Depression   . Anxiety   . Irritable bowel syndrome   . Obesity, Class III, BMI 40-49.9 (morbid obesity) ([place]   . Gout   . Restrictive lung disease   . Echocardiogram findings abnormal, without diagnosis 10/10    10/10: mild pulm HTN, EF 60-65%, mild LVH, moderate aortic regurg  . Early  cataracts, bilateral 10/13    Optho, Dr SGershon Crane . COPD (chronic obstructive pulmonary disease) [place]   . [place]    intrahepatic cholangiocarcinoma  . Osteoarthritis (arthritis due to wear and tear of joints)     also gout   SURGICAL HISTORY: Past Surgical History  Procedure Laterality Date  . Partial hysterectomy    . Rotator cuff repair        L rotator cuff repair 11/07-[facility] - 12/7/200  . Tonsillectomy    . Liver biopsy    . Abdominal hysterectomy    . Open partial hepatectomy [83] Left 02/09/15    SOCIAL HISTORY: Social History   Social History  . Marital Status: Single    Spouse Name: N/A  . Number of Children: 4  . Years of Education: N/A   Occupational History  . unemployed    Social History Main Topics  . Smoking status: Current Some Day Smoker -- 0.50 packs/day for 40 years    Types: Cigarettes  . Smokeless tobacco: Never Used  . Alcohol Use: 0.0 oz/week    0 Standard drinks or equivalent per week     Comment: occasionally  . Drug Use: No  . Sexual Activity: Not on file   Other Topics Concern  . 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 Breast cancer Mother   . Hypertension Mother   . Coronary artery disease Mother   . Diabetes type II Sister   . Pancreatic cancer Sister   . Diabetes type II Mother     ALLERGIES:  is allergic to other.  MEDICATIONS:  Current Outpatient Prescriptions  Medication Sig Dispense Refill  . ACCU-CHEK AVIVA PLUS test strip 1 each by Other route daily.     Bonnie Peterson albuterol (PROAIR HFA) 108 (90 BASE) MCG/ACT inhaler INHALE 2 PUFFS INTO THE  LUNGS EVERY 4 HOURS AS NEEDED FOR WHEEZING OR SHORTNESS OF BREATH 1 Inhaler 0  . alendronate (FOSAMAX) 70 MG tablet TAKE 1 TABLET  ONCE A WEEK. (Patient taking differently: TAKE 1 TABLET  ONCE A WEEK.--Sunday) 12 tablet 2  . amLODipine (NORVASC) 10 MG tablet TAKE 1 TABLET (10 MG TOTAL) BY MOUTH DAILY WITH BREAKFAST. 90 tablet 3  . aspirin 81 MG chewable tablet Chew 81 mg by mouth daily. Reported on 04/28/2015    . atenolol (TENORMIN) 50 MG tablet Take 1 tablet (50 mg total) by mouth daily. 90 tablet 3  . baclofen (LIORESAL) 10 MG tablet Take 1 tablet (10 mg total) by mouth 3 (three) times daily as needed for muscle spasms. (Patient not taking: Reported on 05/12/2015) 90 each 11  . capecitabine (XELODA) 500 MG tablet Take 3 tablets (1,500 mg total) by mouth 2 (two) times daily after a meal. Monday through Friday while getting radiation therapy. 48 tablet 0  . cholecalciferol (VITAMIN D) 1000 UNITS tablet Take 1 tablet (1,000 Units total) by mouth daily. 30 tablet 11  . clindamycin (CLINDAGEL) 1 % gel Apply topically 2 (two) times daily. To boils in the peri-rectal area 30 g 0  . colchicine 0.6 MG tablet TAKE 1 TABLET DAILY AS NEEDED FOR GOUT FLARE (Patient not taking: Reported on 05/19/2015) 30 tablet 1  . diclofenac sodium (VOLTAREN) 1 % GEL Apply 2 g topically 4 (four) times daily as needed. (Patient taking differently: Apply 2 g topically 4 (four) times daily as needed (knee pain, elbow and hands). ) 100 g 4  . enalapril (VASOTEC) 20 MG tablet Take 1 tablet (20 mg total) by mouth daily with breakfast. 90 tablet 3  . fish oil-omega-3 fatty acids 1000 MG capsule Take 1 g by mouth every 30 (thirty) days.     . fluticasone (FLONASE) 50 MCG/ACT nasal spray Place 1 spray into the nose daily as needed for rhinitis.    . hyaluronate sodium (RADIAPLEXRX) GEL Apply 1 application topically 2 (two) times daily.    . Hyprom-Naphaz-Polysorb-Zn Sulf (CLEAR EYES COMPLETE OP) Place 1 drop into both eyes daily as needed  (dry eyes).     Bonnie Peterson loratadine (CLARITIN) 10 MG tablet Take 1 tablet (10 mg total) by mouth daily as needed for allergies. 30 tablet 3  . metFORMIN (GLUCOPHAGE) 1000 MG tablet Take 1 tablet (1,000 mg total) by mouth 2 (two) times daily with a meal. Take 575m each morning, 10059meach night 180 tablet 3  . ondansetron (ZOFRAN) 8 MG tablet Take 1 tablet (8 mg total) by mouth every 8 (eight) hours as needed for nausea or vomiting. 30 tablet 1  . tiotropium (SPIRIVA) 18 MCG inhalation capsule Place 1 capsule (18 mcg total) into inhaler and inhale as needed (if its hot outside). 30 capsule 5  . traMADol (ULTRAM) 50 MG tablet Take 1 tablet (50 mg total) by mouth every 6 (  six) hours as needed for moderate pain or severe pain. 30 tablet 0  . VITAMIN E PO Take 1 tablet by mouth every 30 (thirty) days.      No current facility-administered medications for this visit.    REVIEW OF SYSTEMS:    10 Point review of Systems was done is negative except as noted above.  PHYSICAL EXAMINATION: ECOG PERFORMANCE STATUS: 1 - Symptomatic but completely ambulatory  . Filed Vitals:   05/02/15 1023  BP: 113/65  Pulse: 70  Temp: 98.3 F (36.8 C)  Resp: 18   Filed Weights   05/02/15 1023  Weight: 196 lb 9.6 oz (89.177 kg)   .Body mass index is 34.83 kg/(m^2).  GENERAL:alert, in no acute distress and comfortable SKIN: skin color, texture, turgor are normal, no rashes or significant lesions EYES: normal, conjunctiva are pink and non-injected, sclera clear OROPHARYNX:no exudate, no erythema and lips, buccal mucosa, and tongue normal  NECK: supple, no JVD, thyroid normal size, non-tender, without nodularity LYMPH:  no palpable lymphadenopathy in the cervical, axillary or inguinal LUNGS: clear to auscultation with normal respiratory effort HEART: regular rate & rhythm,  no murmurs and no lower extremity edema ABDOMEN: abdomen obese, soft, non-tender, normoactive bowel sounds , healed surgical  wounds Musculoskeletal: no cyanosis of digits and no clubbing  PSYCH: alert & oriented x 3 with fluent speech NEURO: no focal motor/sensory deficits  LABORATORY DATA:  I have reviewed the data as listed . CBC Latest Ref Rng 05/02/2015 04/12/2015  WBC 3.9 - 10.3 10e3/uL 6.9 10.0  Hemoglobin 11.6 - 15.9 g/dL 12.2 12.6  Hematocrit 34.8 - 46.6 % 37.9 40.0  Platelets 145 - 400 10e3/uL 156 212      CMP Latest Ref Rng 05/02/2015 04/12/2015  Glucose 70 - 140 mg/dl 211(H) 175(H)  BUN 7.0 - 26.0 mg/dL 16.7 13.4  Creatinine 0.6 - 1.1 mg/dL 0.9 0.9  Sodium 136 - 145 mEq/L 135(L) 138  Potassium 3.5 - 5.1 mEq/L 3.9 4.0  Chloride 96 - 112 mEq/L - -  CO2 22 - 29 mEq/L 25 28  Calcium 8.4 - 10.4 mg/dL 9.4 10.7(H)  Total Protein 6.4 - 8.3 g/dL 7.5 8.4(H)  Total Bilirubin 0.20 - 1.20 mg/dL <0.30 0.35  Alkaline Phos 40 - 150 U/L 86 134  AST 5 - 34 U/L 14 19  ALT 0 - 55 U/L 11 28      .  RADIOGRAPHIC STUDIES: I have personally reviewed the radiological images as listed and agreed with the findings in the report. No results found.   CT chest/abd/pelvis 04/04/2015 (at [facility] Findings:  Chest:  Heart and great vessels appear normal. Lipomatous hypertrophy of the intra-atrial septum noted. No pericardial effusion, thickening or calcifications. The central pulmonary arteries are patent and normal in caliber. The central airways are patent. The thyroid is normal. The esophagus demonstrates no focal abnormality.  No mediastinal, hilar, supraclavicular, axillary, internal mammary or cardiophrenic adenopathy. Scattered subcentimeter mediastinal lymph nodes are unchanged.   Diffuse mosaic lung attenuation and subsegmental atelectasis noted. The lungs are otherwise clear. Stable 2-3 mm nodule in the left upper lobe (5/33) and a peripheral 6 mm right lower lobe nodule (image 52). No new pulmonary nodules. No pleural effusions.   Abdomen and Pelvis: No free intraperitoneal air or fluid.  The  major intra-abdominal and pelvic vessels are patent. Scattered aortoiliac atherosclerotic disease noted, without aneurysm.  Interval left hepatectomy noted, without residual enhancing tumor at the lateral resection margin. No focal hepatic lesions are identified.  No intra or extrahepatic biliary ductal dilatation. Gallbladder is surgically absent.  Spleen size normal. Incidental 5 mm low-attenuation lesion in the superior spleen (image 79). The pancreas appears normal. No pancreatic ductal dilatation. The adrenals are normal. The kidneys appear morphologically normal. No hydronephrosis or urolithiasis. Scattered subcentimeter low-attenuation lesions in right renal cortex are too small to characterize.  The small and large bowel demonstrate normal wall thickness and caliber. Incidentally noted fat density polypoid lesion in the sigmoid colon, likely a colonic lipoma. Appendix normal.  Stable 1.3 cm short axis porta hepatis lymph node (image 104). No additional mesenteric or retroperitoneal adenopathy.  The urinary bladder appears unremarkable. The uterus is surgically absent.  No suspicious focal osseous lesions.    Impression: Interval left hepatectomy. No evidence of local disease recurrence or metastatic disease in the chest, abdomen or pelvis. Subcentimeter bilateral pulmonary nodules are unchanged.  Electronically Reviewed by: Erlene Senters, MD    ASSESSMENT & PLAN:   70 yo with multiple medical co-morbids  1) Intrahepatic invasive cholangiocarcinoma T2a N1 M0 (Stage IVA) with focal positive parenchymal margins and 1/2 LN positive. CT C/A/P on 04/03/2016 showed no evidence of cholangiocarcinoma local recurrence or metastases at this time. Some grade 1 diarrhea, nausea and hiccups which have now resolved. Labs stable Plan --continue concurrent Capecitabine with radiation therapy. -has zofran for nausea. --may use tums for heartburns. --if hiccups return and are  persistent might need additional pharmacotherapy.  2) hypercalcemia on labs last week. ? Element of dehydration. Elevated TP. Will check spep/ife with next labs. -resolved with better hydration.  3)  Patient Active Problem List   Diagnosis Date Noted  . Hypercalcemia 05/21/2015  . Bacterial folliculitis 25/36/6440  . Intrahepatic cholangiocarcinoma ([place]) 03/27/2015  . Inadequate sleep hygiene 08/22/2014  . 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 IBS (irritable bowel syndrome) 03/22/2013  . Osteoporosis, unspecified 10/15/2012  . Vitamin D deficiency 12/23/2011  . Tobacco abuse 10/12/2010  . COPD (chronic obstructive pulmonary disease) ([place]) 06/14/2010  . Obstructive sleep apnea 04/27/2009  . OBESITY, UNSPECIFIED 04/26/2009  . DYSLIPIDEMIA 11/25/2007  . GOUT NOS 12/24/2006  . Diabetes mellitus, type 2 ([place]) 06/05/2006  . DEPRESSION, MAJOR, RECURRENT 06/05/2006  . ANXIETY 06/05/2006  . HYPERTENSION, BENIGN SYSTEMIC 06/05/2006  . IRRITABLE BOWEL SYNDROME 06/05/2006  . OSTEOARTHRITIS, MULTI SITES 06/05/2006   Plan -continue f/u with PCP for the ongoing management of other medical issues  I spent 15 minutes counseling the patient face to face. The total time spent in the appointment was 15 minutes and more than 50% was on counseling and direct patient cares.    Sullivan Lone MD [place] AAHIVMS [facility] Hematology/Oncology Physician [facility]  (Office):       [phone] (Work cell):  [phone] (Fax):           [phone]

## 2015-05-21 NOTE — Progress Notes (Signed)
[name]    HEMATOLOGY/ONCOLOGY CLINIC NOTE  Date of Service 04/18/2015  : Patient Care Team: Aquilla Hacker, MD as PCP - General  CHIEF COMPLAINTS/PURPOSE OF CONSULTATION:   F/u for Cholangiocarcinoma  DIAGNOSIS  Stage IVA (T2a, pN1, M0) Intrahepatic Cholangiocarcinoma  Current Treatment  -Concurrent Capecitabine 829m/m2 po BID M-F while on concurrent RT  Previous treatment 02/09/15 L lobe of liver partial hepatectomy segments 2, 3, 4a, 4b invasive cholangiocarcinoma, moderately differentiated. Tumor focally extends to the parenchymal margin of resection. Uninvolved liver parenchyma with steatohepatitis and associated periportal and centrilobular pericellular fibrosis. Scattered von Meyenburg complexes. One hepatic artery LN with metastatic cholangiocarcinoma. Fibroadipose tissue and nerve negative for malignancy. Gallbladder negative for malignancy. One LN negative for malignancy. T2aN1 invasive cholangiocarcinoma, moderately differentiated, with positive hepatic parenchymal margin, and 1/2 nodes positive for tumor involvement.  Interval History  Bonnie MThurowis here for her schedule 1 week followup. She just received her Xeloda and has started taking it 1 day ago. Notes minimal nausea but no other acute new symptoms at this time. Eating well. No issues with bowel movement.  MEDICAL HISTORY:  Past Medical History  Diagnosis Date  . Obstructive sleep apnea   . HTN, goal below 130/80   . HLD (hyperlipidemia)   . Benign positional vertigo   . Depression   . Anxiety   . Irritable bowel syndrome   . Obesity, Class III, BMI 40-49.9 (morbid obesity) ([place]   . Gout   . Restrictive lung disease   . Echocardiogram findings abnormal, without diagnosis 10/10    10/10: mild pulm HTN, EF 60-65%, mild LVH, moderate aortic regurg  . Early cataracts, bilateral 10/13    Optho, Dr SGershon Crane . COPD (chronic obstructive pulmonary disease) ([place]   . [place]    intrahepatic  cholangiocarcinoma  . Osteoarthritis (arthritis due to wear and tear of joints)     also gout   SURGICAL HISTORY: Past Surgical History  Procedure Laterality Date  . Partial hysterectomy    . Rotator cuff repair        L rotator cuff repair 11/07-[facility] - 12/7/200  . Tonsillectomy    . Liver biopsy    . Abdominal hysterectomy    . Open partial hepatectomy [83] Left 02/09/15    SOCIAL HISTORY: Social History   Social History  . Marital Status: Single    Spouse Name: N/A  . Number of Children: 4  . Years of Education: N/A   Occupational History  . unemployed    Social History Main Topics  . Smoking status: Current Some Day Smoker -- 0.50 packs/day for 40 years    Types: Cigarettes  . Smokeless tobacco: Never Used  . Alcohol Use: 0.0 oz/week    0 Standard drinks or equivalent per week     Comment: occasionally  . Drug Use: No  . Sexual Activity: Not on file   Other Topics Concern  . 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 Breast cancer Mother   . Hypertension Mother   .  Coronary artery disease Mother   . Diabetes type II Sister   . Pancreatic cancer Sister   . Diabetes type II Mother     ALLERGIES:  is allergic to other.  MEDICATIONS:  Current Outpatient Prescriptions  Medication Sig Dispense Refill  . ACCU-CHEK AVIVA PLUS test strip 1 each by Other route daily.     [name] albuterol (PROAIR HFA) 108 (90 BASE) MCG/ACT inhaler INHALE 2 PUFFS INTO THE LUNGS EVERY 4 HOURS AS NEEDED FOR WHEEZING OR SHORTNESS OF BREATH 1 Inhaler 0  . alendronate (FOSAMAX) 70 MG tablet TAKE 1 TABLET  ONCE A WEEK.  (Patient taking differently: TAKE 1 TABLET  ONCE A WEEK.--Sunday) 12 tablet 2  . aspirin 81 MG chewable tablet Chew 81 mg by mouth daily. Reported on 04/28/2015    . atenolol (TENORMIN) 50 MG tablet Take 1 tablet (50 mg total) by mouth daily. 90 tablet 3  . baclofen (LIORESAL) 10 MG tablet Take 1 tablet (10 mg total) by mouth 3 (three) times daily as needed for muscle spasms. (Patient not taking: Reported on 05/12/2015) 90 each 11  . cholecalciferol (VITAMIN D) 1000 UNITS tablet Take 1 tablet (1,000 Units total) by mouth daily. 30 tablet 11  . colchicine 0.6 MG tablet TAKE 1 TABLET DAILY AS NEEDED FOR GOUT FLARE (Patient not taking: Reported on 05/19/2015) 30 tablet 1  . diclofenac sodium (VOLTAREN) 1 % GEL Apply 2 g topically 4 (four) times daily as needed. (Patient taking differently: Apply 2 g topically 4 (four) times daily as needed (knee pain, elbow and hands). ) 100 g 4  . enalapril (VASOTEC) 20 MG tablet Take 1 tablet (20 mg total) by mouth daily with breakfast. 90 tablet 3  . fish oil-omega-3 fatty acids 1000 MG capsule Take 1 g by mouth every 30 (thirty) days.     . fluticasone (FLONASE) 50 MCG/ACT nasal spray Place 1 spray into the nose daily as needed for rhinitis.    . hyaluronate sodium (RADIAPLEXRX) GEL Apply 1 application topically 2 (two) times daily.    . Hyprom-Naphaz-Polysorb-Zn Sulf (CLEAR EYES COMPLETE OP) Place 1 drop into both eyes daily as needed (dry eyes).     [name] loratadine (CLARITIN) 10 MG tablet Take 1 tablet (10 mg total) by mouth daily as needed for allergies. 30 tablet 3  . metFORMIN (GLUCOPHAGE) 1000 MG tablet Take 1 tablet (1,000 mg total) by mouth 2 (two) times daily with a meal. Take 530m each morning, 10023meach night 180 tablet 3  . tiotropium (SPIRIVA) 18 MCG inhalation capsule Place 1 capsule (18 mcg total) into inhaler and inhale as needed (if its hot outside). 30 capsule 5  . traMADol (ULTRAM) 50 MG tablet Take 1 tablet (50 mg total) by mouth every 6 (six) hours  as needed for moderate pain or severe pain. 30 tablet 0  . VITAMIN E PO Take 1 tablet by mouth every 30 (thirty) days.     . Marland KitchenmLODipine (NORVASC) 10 MG tablet TAKE 1 TABLET (10 MG TOTAL) BY MOUTH DAILY WITH BREAKFAST. 90 tablet 3  . capecitabine (XELODA) 500 MG tablet Take 3 tablets (1,500 mg total) by mouth 2 (two) times daily after a meal. Monday through Friday while getting radiation therapy. 48 tablet 0  . clindamycin (CLINDAGEL) 1 % gel Apply topically 2 (two) times daily. To boils in the peri-rectal area 30 g 0  . ondansetron (ZOFRAN) 8 MG tablet Take 1 tablet (8 mg total) by mouth every 8 (eight) hours as needed  for nausea or vomiting. 30 tablet 1   No current facility-administered medications for this visit.    REVIEW OF SYSTEMS:    10 Point review of Systems was done is negative except as noted above.  PHYSICAL EXAMINATION: ECOG PERFORMANCE STATUS: 1 - Symptomatic but completely ambulatory  . Filed Vitals:   04/18/15 1328  BP: 100/55  Pulse: 70  Temp: 98.1 F (36.7 C)  Resp: 18   Filed Weights   04/18/15 1328  Weight: 199 lb 12.8 oz (90.629 kg)   .Body mass index is 35.4 kg/(m^2).  GENERAL:alert, in no acute distress and comfortable SKIN: skin color, texture, turgor are normal, no rashes or significant lesions EYES: normal, conjunctiva are pink and non-injected, sclera clear OROPHARYNX:no exudate, no erythema and lips, buccal mucosa, and tongue normal  NECK: supple, no JVD, thyroid normal size, non-tender, without nodularity LYMPH:  no palpable lymphadenopathy in the cervical, axillary or inguinal LUNGS: clear to auscultation with normal respiratory effort HEART: regular rate & rhythm,  no murmurs and no lower extremity edema ABDOMEN: abdomen obese, soft, non-tender, normoactive bowel sounds , healed surgical wounds Musculoskeletal: no cyanosis of digits and no clubbing  PSYCH: alert & oriented x 3 with fluent speech NEURO: no focal motor/sensory  deficits  LABORATORY DATA:  I have reviewed the data as listed  .  RADIOGRAPHIC STUDIES: I have personally reviewed the radiological images as listed and agreed with the findings in the report. No results found.   CT chest/abd/pelvis 04/04/2015 (at [name]) Findings:  Chest:  Heart and great vessels appear normal. Lipomatous hypertrophy of the intra-atrial septum noted. No pericardial effusion, thickening or calcifications. The central pulmonary arteries are patent and normal in caliber. The central airways are patent. The thyroid is normal. The esophagus demonstrates no focal abnormality.  No mediastinal, hilar, supraclavicular, axillary, internal mammary or cardiophrenic adenopathy. Scattered subcentimeter mediastinal lymph nodes are unchanged.   Diffuse mosaic lung attenuation and subsegmental atelectasis noted. The lungs are otherwise clear. Stable 2-3 mm nodule in the left upper lobe (5/33) and a peripheral 6 mm right lower lobe nodule (image 52). No new pulmonary nodules. No pleural effusions.   Abdomen and Pelvis: No free intraperitoneal air or fluid.  The major intra-abdominal and pelvic vessels are patent. Scattered aortoiliac atherosclerotic disease noted, without aneurysm.  Interval left hepatectomy noted, without residual enhancing tumor at the lateral resection margin. No focal hepatic lesions are identified. No intra or extrahepatic biliary ductal dilatation. Gallbladder is surgically absent.  Spleen size normal. Incidental 5 mm low-attenuation lesion in the superior spleen (image 79). The pancreas appears normal. No pancreatic ductal dilatation. The adrenals are normal. The kidneys appear morphologically normal. No hydronephrosis or urolithiasis. Scattered subcentimeter low-attenuation lesions in right renal cortex are too small to characterize.  The small and large bowel demonstrate normal wall thickness and caliber. Incidentally noted fat density  polypoid lesion in the sigmoid colon, likely a colonic lipoma. Appendix normal.  Stable 1.3 cm short axis porta hepatis lymph node (image 104). No additional mesenteric or retroperitoneal adenopathy.  The urinary bladder appears unremarkable. The uterus is surgically absent.  No suspicious focal osseous lesions.    Impression: Interval left hepatectomy. No evidence of local disease recurrence or metastatic disease in the chest, abdomen or pelvis. Subcentimeter bilateral pulmonary nodules are unchanged.  Electronically Reviewed by: Erlene Senters, MD    ASSESSMENT & PLAN:   70 yo with multiple medical co-morbids  1) Intrahepatic invasive cholangiocarcinoma T2a N1 M0 (Stage IVA) with focal  positive parenchymal margins and 1/2 LN positive. CT C/A/P on 04/03/2016 showed no evidence of cholangiocarcinoma local recurrence or metastases at this time. Plan -patient just started taking her Capecitabine and notes no acute issues at this time. -continue concurrent Capecitabine with radiation therapy. -has zofran for nausea.  2) hypercalcemia on labs last week. ? Element of dehydration. Elevated TP. Will check spep/ife with next labs. -patient counseled to maintain good hydration.  3)  Patient Active Problem List   Diagnosis Date Noted  . Hypercalcemia 05/21/2015  . Bacterial folliculitis 97/05/6376  . Intrahepatic cholangiocarcinoma ([place]) 03/27/2015  . Inadequate sleep hygiene 08/22/2014  . 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 IBS (irritable bowel syndrome) 03/22/2013  . Osteoporosis, unspecified 10/15/2012  . Vitamin D deficiency 12/23/2011  . Tobacco abuse 10/12/2010  . COPD (chronic obstructive pulmonary disease) ([place]) 06/14/2010  . Obstructive sleep apnea 04/27/2009  . OBESITY, UNSPECIFIED 04/26/2009  . DYSLIPIDEMIA 11/25/2007  . GOUT NOS 12/24/2006  . Diabetes mellitus, type 2 ([place]) 06/05/2006  . DEPRESSION, MAJOR, RECURRENT  06/05/2006  . ANXIETY 06/05/2006  . HYPERTENSION, BENIGN SYSTEMIC 06/05/2006  . IRRITABLE BOWEL SYNDROME 06/05/2006  . OSTEOARTHRITIS, MULTI SITES 06/05/2006   Plan -continue f/u with PCP for the ongoing management of other medical issues  I spent 15 minutes counseling the patient face to face. The total time spent in the appointment was 15 minutes and more than 50% was on counseling and direct patient cares.    Sullivan Lone MD [place] AAHIVMS [facility] Hematology/Oncology Physician [facility]  (Office):       [phone] (Work cell):  [phone] (Fax):           [phone]

## 2015-05-22 ENCOUNTER — Ambulatory Visit
Admission: RE | Admit: 2015-05-22 | Discharge: 2015-05-22 | Disposition: A | Discharge status: Home / Self Care | Payer: Medicare HMO | Source: Ambulatory Visit | Attending: Radiation Oncology | Admitting: Radiation Oncology

## 2015-05-22 ENCOUNTER — Encounter (HOSPITAL_COMMUNITY): Payer: Self-pay | Admitting: *Deleted

## 2015-05-22 ENCOUNTER — Encounter: Payer: Self-pay | Admitting: *Deleted

## 2015-05-22 ENCOUNTER — Other Ambulatory Visit: Payer: Self-pay | Admitting: *Deleted

## 2015-05-22 ENCOUNTER — Ambulatory Visit: Payer: Medicare HMO

## 2015-05-22 ENCOUNTER — Emergency Department (HOSPITAL_COMMUNITY)
Admission: EM | Admit: 2015-05-22 | Discharge: 2015-05-22 | Disposition: A | Discharge status: Home / Self Care | Payer: Medicare HMO | Attending: Emergency Medicine | Admitting: Emergency Medicine

## 2015-05-22 ENCOUNTER — Ambulatory Visit: Admission: RE | Admit: 2015-05-22 | Payer: Medicare HMO | Source: Ambulatory Visit

## 2015-05-22 ENCOUNTER — Encounter: Payer: Self-pay | Admitting: Radiation Oncology

## 2015-05-22 ENCOUNTER — Telehealth: Payer: Self-pay | Admitting: Hematology

## 2015-05-22 VITALS — BP 79/50 | HR 75 | Temp 97.6°F | Ht 63.0 in | Wt 191.0 lb

## 2015-05-22 DIAGNOSIS — C221 Intrahepatic bile duct carcinoma: Secondary | ICD-10-CM

## 2015-05-22 DIAGNOSIS — I10 Essential (primary) hypertension: Secondary | ICD-10-CM | POA: Insufficient documentation

## 2015-05-22 DIAGNOSIS — F17210 Nicotine dependence, cigarettes, uncomplicated: Secondary | ICD-10-CM | POA: Insufficient documentation

## 2015-05-22 DIAGNOSIS — R109 Unspecified abdominal pain: Secondary | ICD-10-CM | POA: Diagnosis not present

## 2015-05-22 DIAGNOSIS — Z510 Encounter for antineoplastic radiation therapy: Secondary | ICD-10-CM | POA: Diagnosis not present

## 2015-05-22 DIAGNOSIS — J449 Chronic obstructive pulmonary disease, unspecified: Secondary | ICD-10-CM | POA: Insufficient documentation

## 2015-05-22 DIAGNOSIS — R1110 Vomiting, unspecified: Secondary | ICD-10-CM | POA: Diagnosis present

## 2015-05-22 LAB — COMPREHENSIVE METABOLIC PANEL
ALT: 14 U/L (ref 14–54)
AST: 19 U/L (ref 15–41)
Albumin: 3.6 g/dL (ref 3.5–5.0)
Alkaline Phosphatase: 73 U/L (ref 38–126)
Anion gap: 7 (ref 5–15)
BUN: 10 mg/dL (ref 6–20)
CO2: 27 mmol/L (ref 22–32)
Calcium: 8.7 mg/dL — ABNORMAL LOW (ref 8.9–10.3)
Chloride: 102 mmol/L (ref 101–111)
Creatinine, Ser: 0.81 mg/dL (ref 0.44–1.00)
GFR calc Af Amer: >60 mL/min (ref >60)
GFR calc non Af Amer: >60 mL/min (ref >60)
Glucose, Bld: 138 mg/dL — ABNORMAL HIGH (ref 65–99)
Potassium: 3.9 mmol/L (ref 3.5–5.1)
Sodium: 136 mmol/L (ref 135–145)
Total Bilirubin: 0.6 mg/dL (ref 0.3–1.2)
Total Protein: 6.9 g/dL (ref 6.5–8.1)

## 2015-05-22 LAB — LIPASE, BLOOD: Lipase: 28 U/L (ref 11–51)

## 2015-05-22 LAB — CBC
HCT: 36.1 % (ref 36.0–46.0)
Hemoglobin: 11.6 g/dL — ABNORMAL LOW (ref 12.0–15.0)
MCH: 28.9 pg (ref 26.0–34.0)
MCHC: 32.1 g/dL (ref 30.0–36.0)
MCV: 90 fL (ref 78.0–100.0)
Platelets: 123 10*3/uL — ABNORMAL LOW (ref 150–400)
RBC: 4.01 MIL/uL (ref 3.87–5.11)
RDW: 16.7 % — ABNORMAL HIGH (ref 11.5–15.5)
WBC: 5.1 10*3/uL (ref 4.0–10.5)

## 2015-05-22 NOTE — ED Notes (Signed)
Pt reports n/v and abd pain today.  Had radiation today and was instructed to come to the ED to r/o dehydration.  Pt is A&Ox 4.  Pt is weak.

## 2015-05-22 NOTE — Progress Notes (Signed)
Pt went to ED per [facility] in [place]. IVF appt to be canceled today.

## 2015-05-22 NOTE — Progress Notes (Addendum)
Patient has completed 25 fractions to her abdomen.  She denies having pain.  She reports having nausea and vomiting that started on Friday night.  She is taking zofran q 8 hours without relief.  She is also taking Xeloda.  She reports vomiting 4 times per day since Friday and is unable to keep anything down.  Her bp was low at 93/45 with a hr of 70.  Orthostatic vitals were taken: bp standing 79/50, hr 75.  She is taking norvasc, atenolol and enalapril.  BP 93/45 mmHg  Pulse 70  Temp(Src) 97.6 F (36.4 C) (Oral)  Ht 5\' 3"  (1.6 m)  Wt 191 lb (86.637 kg)  BMI 33.84 kg/m2  SpO2 100%   Wt Readings from Last 3 Encounters:  05/22/15 191 lb (86.637 kg)  05/19/15 191 lb 8 oz (86.864 kg)  05/17/15 192 lb 14.4 oz (87.499 kg)

## 2015-05-22 NOTE — Progress Notes (Signed)
Department of Radiation Oncology  Phone:  [phone] Fax:        [phone]  Weekly Treatment Note    Name: Bonnie Peterson Date: 05/22/2015 MRN: [id number] DOB: [birth year]   Diagnosis:     ICD-9-CM ICD-10-CM   1. Intrahepatic cholangiocarcinoma (HCC) 155.1 C22.1      Current dose: 45/50.4 Gy  Current fraction: 25/28   MEDICATIONS: Current Outpatient Prescriptions  Medication Sig Dispense Refill  . ACCU-CHEK AVIVA PLUS test strip 1 each by Other route daily.     [name] albuterol (PROAIR HFA) 108 (90 BASE) MCG/ACT inhaler INHALE 2 PUFFS INTO THE LUNGS EVERY 4 HOURS AS NEEDED FOR WHEEZING OR SHORTNESS OF BREATH 1 Inhaler 0  . alendronate (FOSAMAX) 70 MG tablet TAKE 1 TABLET  ONCE A WEEK. (Patient taking differently: TAKE 1 TABLET  ONCE A WEEK.--Sunday) 12 tablet 2  . amLODipine (NORVASC) 10 MG tablet TAKE 1 TABLET (10 MG TOTAL) BY MOUTH DAILY WITH BREAKFAST. 90 tablet 3  . aspirin 81 MG chewable tablet Chew 81 mg by mouth daily. Reported on 04/28/2015    . atenolol (TENORMIN) 50 MG tablet Take 1 tablet (50 mg total) by mouth daily. 90 tablet 3  . capecitabine (XELODA) 500 MG tablet Take 3 tablets (1,500 mg total) by mouth 2 (two) times daily after a meal. Monday through Friday while getting radiation therapy. 48 tablet 0  . cholecalciferol (VITAMIN D) 1000 UNITS tablet Take 1 tablet (1,000 Units total) by mouth daily. 30 tablet 11  . clindamycin (CLINDAGEL) 1 % gel Apply topically 2 (two) times daily. To boils in the peri-rectal area 30 g 0  . diclofenac sodium (VOLTAREN) 1 % GEL Apply 2 g topically 4 (four) times daily as needed. (Patient taking differently: Apply 2 g topically 4 (four) times daily as needed (knee pain, elbow and hands). ) 100 g 4  . enalapril (VASOTEC) 20 MG tablet Take 1 tablet (20 mg total) by mouth daily with breakfast. 90 tablet 3  . fish oil-omega-3 fatty acids 1000 MG capsule Take 1 g by mouth every 30 (thirty) days.     . fluticasone (FLONASE) 50 MCG/ACT  nasal spray Place 1 spray into the nose daily as needed for rhinitis.    . hyaluronate sodium (RADIAPLEXRX) GEL Apply 1 application topically 2 (two) times daily.    . Hyprom-Naphaz-Polysorb-Zn Sulf (CLEAR EYES COMPLETE OP) Place 1 drop into both eyes daily as needed (dry eyes).     [name] loratadine (CLARITIN) 10 MG tablet Take 1 tablet (10 mg total) by mouth daily as needed for allergies. 30 tablet 3  . metFORMIN (GLUCOPHAGE) 1000 MG tablet Take 1 tablet (1,000 mg total) by mouth 2 (two) times daily with a meal. Take 500mg  each morning, 1000mg  each night 180 tablet 3  . ondansetron (ZOFRAN) 8 MG tablet Take 1 tablet (8 mg total) by mouth every 8 (eight) hours as needed for nausea or vomiting. 30 tablet 1  . tiotropium (SPIRIVA) 18 MCG inhalation capsule Place 1 capsule (18 mcg total) into inhaler and inhale as needed (if its hot outside). 30 capsule 5  . traMADol (ULTRAM) 50 MG tablet Take 1 tablet (50 mg total) by mouth every 6 (six) hours as needed for moderate pain or severe pain. 30 tablet 0  . VITAMIN E PO Take 1 tablet by mouth every 30 (thirty) days.     . baclofen (LIORESAL) 10 MG tablet Take 1 tablet (10 mg total) by mouth 3 (three) times daily as  needed for muscle spasms. (Patient not taking: Reported on 05/12/2015) 90 each 11  . colchicine 0.6 MG tablet TAKE 1 TABLET DAILY AS NEEDED FOR GOUT FLARE (Patient not taking: Reported on 05/19/2015) 30 tablet 1  . hydrochlorothiazide (HYDRODIURIL) 25 MG tablet      No current facility-administered medications for this encounter.     ALLERGIES: Other   LABORATORY DATA:  Lab Results  Component Value Date   WBC 5.4 05/17/2015   HGB 12.6 05/17/2015   HCT 39.3 05/17/2015   MCV 87.7 05/17/2015   PLT 103* 05/17/2015   Lab Results  Component Value Date   NA 138 05/17/2015   K 4.3 05/17/2015   CL 99 04/14/2014   CO2 25 05/17/2015   Lab Results  Component Value Date   ALT 13 05/17/2015   AST 14 05/17/2015   ALKPHOS 81 05/17/2015   BILITOT  0.51 05/17/2015     NARRATIVE:   Bonnie Peterson is a very pleasant 70 year old female undergoing Xeloda chemotherapy with external beam radiation for cholangiocarcinoma. She comes today for a work in visit after receiving radiation this morning.   On review of systems, she states that she was doing well  Last Friday morning, and midway into the day however later that day started experiencing nausea and has had episodes of intractable vomiting through the weekend. She states that when this occurs she is experiencing waves of abdominal discomfort relieved by emesis. She states that she has vomited on digested food as well as dark green contents. She states that her last bowel movement was this morning that was small and what she describes as being tight. She is not passing gas and states that she has been belching quite a bit. She feels as though her abdomen is distended , and she has been taking Zofran up to 4 times a day without significant relief. She states that it is been since Friday since she was able to keep any liquids or solids down. She denies any chest pain or shortness of breath. She is not experiencing abdominal pain currently. She denies any urinary disturbances. She has not had any fevers or chills. No other complaints or verbalized.   PHYSICAL EXAMINATION: height is 5\' 3"  (1.6 m) and weight is 191 lb (86.637 kg). Her oral temperature is 97.6 F (36.4 C). Her blood pressure is 79/50 and her pulse is 75. Her oxygen saturation is 100%.     Of note her initial blood pressure in the office  while seated was  93/45.  Pain scale 0/10  In general this is and uncomfortable appearing African-American female in no acute distress. She's alert and oriented 4 and appropriate throughout the examination. Cardiovascular exam reveals a regular rate and rhythm, no clicks rubs or murmurs are auscultated. Chest is clear to auscultation bilaterally. Evaluation of her abdomen reveals chevron scar consistent with  her hepatobiliary surgery. She has hypoactive bowel sounds in the lower quadrants and absent bowel sounds in the upper quadrants bilaterally. The abdomen is distended and is non- tympanic to percussion. No hepatosplenomegaly is noted, no palpable fascial defects are identified. The patient is slightly tender over the epigastrium. No rebound or guarding is identified. Lower extremities are negative for pretibial pitting edema bilaterally.      ASSESSMENT:   Cholangiocarcinoma undergoing adjuvant therapy with intractable nausea and vomiting  PLAN:   I have contacted Dr. Lisbeth Renshaw to discuss the patient's symptoms, and he agrees with my suggestion of sending the patient to the emergency  room for further assessment and to rule out small bowel obstruction. Not only has she had abdominal surgery for her hepatobiliary disease, she's also undergone total abdominal hysterectomy in the past which I dicussed could increase her risk of ileus/SBO. I encouraged the patient to present to the emergency room for abdominal imaging and IV hydration with antiemetics. She insists on going home prior to presenting to the ED but promises me that she will go today. We will determine if she undergoes additional radiation tomorrow based on the findings during this workup.  Carola Rhine, PAC

## 2015-05-22 NOTE — Telephone Encounter (Signed)
Per MD nurse cancel 02/13 POF, pt was admitted in the hospital... KJ

## 2015-05-22 NOTE — ED Notes (Signed)
Pt states that she is leaving.  She no longer wants to wait because she is hungry.

## 2015-05-23 ENCOUNTER — Ambulatory Visit
Admission: RE | Admit: 2015-05-23 | Discharge: 2015-05-23 | Disposition: A | Discharge status: Home / Self Care | Payer: Medicare HMO | Source: Ambulatory Visit | Attending: Radiation Oncology | Admitting: Radiation Oncology

## 2015-05-23 ENCOUNTER — Encounter (HOSPITAL_COMMUNITY): Payer: Self-pay | Admitting: Emergency Medicine

## 2015-05-23 ENCOUNTER — Emergency Department (HOSPITAL_COMMUNITY)
Admission: EM | Admit: 2015-05-23 | Discharge: 2015-05-23 | Disposition: A | Discharge status: Home / Self Care | Payer: Medicare HMO | Attending: Emergency Medicine | Admitting: Emergency Medicine

## 2015-05-23 ENCOUNTER — Emergency Department (HOSPITAL_COMMUNITY): Payer: Medicare HMO

## 2015-05-23 ENCOUNTER — Encounter: Payer: Self-pay | Admitting: Radiation Oncology

## 2015-05-23 DIAGNOSIS — Z6841 Body Mass Index (BMI) 40.0 and over, adult: Secondary | ICD-10-CM | POA: Diagnosis not present

## 2015-05-23 DIAGNOSIS — F419 Anxiety disorder, unspecified: Secondary | ICD-10-CM | POA: Insufficient documentation

## 2015-05-23 DIAGNOSIS — Z7982 Long term (current) use of aspirin: Secondary | ICD-10-CM | POA: Insufficient documentation

## 2015-05-23 DIAGNOSIS — F329 Major depressive disorder, single episode, unspecified: Secondary | ICD-10-CM | POA: Insufficient documentation

## 2015-05-23 DIAGNOSIS — K2970 Gastritis, unspecified, without bleeding: Secondary | ICD-10-CM | POA: Diagnosis not present

## 2015-05-23 DIAGNOSIS — Z8509 Personal history of malignant neoplasm of other digestive organs: Secondary | ICD-10-CM | POA: Insufficient documentation

## 2015-05-23 DIAGNOSIS — E785 Hyperlipidemia, unspecified: Secondary | ICD-10-CM | POA: Diagnosis not present

## 2015-05-23 DIAGNOSIS — R112 Nausea with vomiting, unspecified: Secondary | ICD-10-CM | POA: Diagnosis present

## 2015-05-23 DIAGNOSIS — Z79899 Other long term (current) drug therapy: Secondary | ICD-10-CM | POA: Diagnosis not present

## 2015-05-23 DIAGNOSIS — I10 Essential (primary) hypertension: Secondary | ICD-10-CM | POA: Insufficient documentation

## 2015-05-23 DIAGNOSIS — R109 Unspecified abdominal pain: Secondary | ICD-10-CM | POA: Insufficient documentation

## 2015-05-23 DIAGNOSIS — Z7984 Long term (current) use of oral hypoglycemic drugs: Secondary | ICD-10-CM | POA: Diagnosis not present

## 2015-05-23 DIAGNOSIS — M109 Gout, unspecified: Secondary | ICD-10-CM | POA: Insufficient documentation

## 2015-05-23 DIAGNOSIS — Z8669 Personal history of other diseases of the nervous system and sense organs: Secondary | ICD-10-CM | POA: Diagnosis not present

## 2015-05-23 DIAGNOSIS — M1990 Unspecified osteoarthritis, unspecified site: Secondary | ICD-10-CM | POA: Diagnosis not present

## 2015-05-23 DIAGNOSIS — F17210 Nicotine dependence, cigarettes, uncomplicated: Secondary | ICD-10-CM | POA: Insufficient documentation

## 2015-05-23 DIAGNOSIS — J449 Chronic obstructive pulmonary disease, unspecified: Secondary | ICD-10-CM | POA: Insufficient documentation

## 2015-05-23 LAB — COMPREHENSIVE METABOLIC PANEL
ALT: 14 U/L (ref 14–54)
AST: 21 U/L (ref 15–41)
Albumin: 3.8 g/dL (ref 3.5–5.0)
Alkaline Phosphatase: 71 U/L (ref 38–126)
Anion gap: 10 (ref 5–15)
BUN: 11 mg/dL (ref 6–20)
CO2: 25 mmol/L (ref 22–32)
Calcium: 9.1 mg/dL (ref 8.9–10.3)
Chloride: 103 mmol/L (ref 101–111)
Creatinine, Ser: 0.69 mg/dL (ref 0.44–1.00)
GFR calc Af Amer: >60 mL/min (ref >60)
GFR calc non Af Amer: >60 mL/min (ref >60)
Glucose, Bld: 181 mg/dL — ABNORMAL HIGH (ref 65–99)
Potassium: 3.9 mmol/L (ref 3.5–5.1)
Sodium: 138 mmol/L (ref 135–145)
Total Bilirubin: 0.5 mg/dL (ref 0.3–1.2)
Total Protein: 7.2 g/dL (ref 6.5–8.1)

## 2015-05-23 LAB — CBC
HCT: 39.2 % (ref 36.0–46.0)
Hemoglobin: 12.6 g/dL (ref 12.0–15.0)
MCH: 28.9 pg (ref 26.0–34.0)
MCHC: 32.1 g/dL (ref 30.0–36.0)
MCV: 89.9 fL (ref 78.0–100.0)
Platelets: 137 10*3/uL — ABNORMAL LOW (ref 150–400)
RBC: 4.36 MIL/uL (ref 3.87–5.11)
RDW: 16.9 % — ABNORMAL HIGH (ref 11.5–15.5)
WBC: 5.8 10*3/uL (ref 4.0–10.5)

## 2015-05-23 LAB — LIPASE, BLOOD: Lipase: 32 U/L (ref 11–51)

## 2015-05-23 LAB — URINALYSIS, ROUTINE W REFLEX MICROSCOPIC
Bilirubin Urine: NEGATIVE
Glucose, UA: NEGATIVE mg/dL
Hgb urine dipstick: NEGATIVE
Ketones, ur: NEGATIVE mg/dL
Leukocytes, UA: NEGATIVE
Nitrite: NEGATIVE
Protein, ur: NEGATIVE mg/dL
Specific Gravity, Urine: 1.045 — ABNORMAL HIGH (ref 1.005–1.030)
pH: 6 (ref 5.0–8.0)

## 2015-05-23 MED ORDER — IOHEXOL 300 MG/ML  SOLN
100.0000 mL | Freq: Once | INTRAMUSCULAR | Status: AC | PRN
  Administered 2015-05-23: 100 mL via INTRAVENOUS

## 2015-05-23 MED ORDER — ONDANSETRON HCL 4 MG/2ML IJ SOLN
4.0000 mg | Freq: Once | INTRAMUSCULAR | Status: AC
  Administered 2015-05-23: 4 mg via INTRAVENOUS
  Filled 2015-05-23: qty 2

## 2015-05-23 MED ORDER — IOHEXOL 300 MG/ML  SOLN
50.0000 mL | Freq: Once | INTRAMUSCULAR | Status: DC | PRN
  Administered 2015-05-23: 50 mL via ORAL
  Filled 2015-05-23: qty 50

## 2015-05-23 MED ORDER — MORPHINE SULFATE (PF) 4 MG/ML IV SOLN
4.0000 mg | Freq: Once | INTRAVENOUS | Status: AC
  Administered 2015-05-23: 4 mg via INTRAVENOUS
  Filled 2015-05-23: qty 1

## 2015-05-23 MED ORDER — SODIUM CHLORIDE 0.9 % IV BOLUS (SEPSIS)
1000.0000 mL | Freq: Once | INTRAVENOUS | Status: AC
  Administered 2015-05-23: 1000 mL via INTRAVENOUS

## 2015-05-23 MED ORDER — PANTOPRAZOLE SODIUM 40 MG IV SOLR
40.0000 mg | Freq: Once | INTRAVENOUS | Status: AC
  Administered 2015-05-23: 40 mg via INTRAVENOUS
  Filled 2015-05-23: qty 40

## 2015-05-23 MED ORDER — METOCLOPRAMIDE HCL 10 MG PO TABS
10.0000 mg | ORAL_TABLET | Freq: Three times a day (TID) | ORAL | Status: DC | PRN
Start: 2015-05-23 — End: 2015-06-16

## 2015-05-23 MED ORDER — FAMOTIDINE 20 MG PO TABS: 20.0000 mg | ORAL_TABLET | Freq: Two times a day (BID) | ORAL | Status: DC

## 2015-05-23 NOTE — ED Provider Notes (Signed)
CSN: [id number]     Arrival date & time 05/23/15  1118 History   First MD Initiated Contact with Patient 05/23/15 1251     Chief Complaint  Patient presents with  . Abdominal Pain  . Nausea  . 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  Had normal BM this morning.  Denies fevers, CP, SOB, diarrhea/constipation, urinary symptoms.    Past Medical History  Diagnosis Date  . Obstructive sleep apnea   . HTN, goal below 130/80   . HLD (hyperlipidemia)   . Benign positional vertigo   . Depression   . Anxiety   . Irritable bowel syndrome   . Obesity, Class III, BMI 40-49.9 (morbid obesity) ([place])   . Gout   . Restrictive lung disease   . Echocardiogram findings abnormal, without diagnosis 10/10    10/10: mild pulm HTN, EF 60-65%, mild LVH, moderate aortic regurg  . Early cataracts, bilateral 10/13    Optho, Dr Gershon Crane  . COPD (chronic obstructive pulmonary disease) ([place])   . Cancer ([place]) 02/09/15    intrahepatic cholangiocarcinoma  . Osteoarthritis (arthritis due to wear and tear of joints)     also gout   Past Surgical History  Procedure Laterality Date  . Partial hysterectomy    . Rotator cuff repair        L rotator cuff repair 11/07-[place] - 12/7/200  . Tonsillectomy    . Liver biopsy    . Abdominal hysterectomy    . Open partial hepatectomy [83] Left 02/09/15   Family History  Problem Relation Age of Onset  . Breast cancer Mother   . Hypertension Mother   . Coronary artery disease Mother   . Diabetes type II Sister   . Pancreatic cancer Sister   . Diabetes type II Mother    Social History  Substance Use Topics  . Smoking status: Current Some Day Smoker -- 0.50 packs/day for 40 years    Types:  Cigarettes  . Smokeless tobacco: Never Used  . Alcohol Use: 0.0 oz/week    0 Standard drinks or equivalent per week     Comment: occasionally   OB History    No data available     Review of Systems  All other systems reviewed and are negative.     Allergies  Other  Home Medications   Prior to Admission medications   Medication Sig Start Date End Date Taking? Authorizing Provider  ACCU-CHEK AVIVA PLUS test strip 1 each by Other route daily.  03/15/14  Yes Historical Provider, MD  albuterol (PROAIR HFA) 108 (90 BASE) MCG/ACT inhaler INHALE 2 PUFFS INTO THE LUNGS EVERY 4 HOURS AS NEEDED FOR WHEEZING OR SHORTNESS OF BREATH 08/15/14  Yes Aquilla Hacker, MD  alendronate (FOSAMAX) 70 MG tablet TAKE 1 TABLET  ONCE A WEEK. Patient taking differently: TAKE 1 TABLET  ONCE A WEEK.--Sunday 08/29/14  Yes York Ram Melancon, MD  amLODipine (NORVASC) 10 MG tablet TAKE 1 TABLET (10 MG TOTAL) BY MOUTH DAILY WITH BREAKFAST. 05/10/15  Yes Aquilla Hacker, MD  aspirin 81 MG chewable tablet Chew 81 mg by mouth daily. Reported on 04/28/2015   Yes Historical Provider, MD  atenolol (TENORMIN) 50 MG tablet Take 1 tablet (50 mg total) by mouth daily. 05/12/14  Yes Aquilla Hacker, MD  baclofen (LIORESAL)  10 MG tablet Take 1 tablet (10 mg total) by mouth 3 (three) times daily as needed for muscle spasms. 08/30/14  Yes Aquilla Hacker, MD  capecitabine (XELODA) 500 MG tablet Take 3 tablets (1,500 mg total) by mouth 2 (two) times daily after a meal. Monday through Friday while getting radiation therapy. 05/17/15  Yes Brunetta Genera, MD  cholecalciferol (VITAMIN D) 1000 UNITS tablet Take 1 tablet (1,000 Units total) by mouth daily. 08/15/14  Yes York Ram Melancon, MD  clindamycin (CLINDAGEL) 1 % gel Apply topically 2 (two) times daily. To boils in the peri-rectal area 05/17/15 05/27/15 Yes Gautam Juleen China, MD  colchicine 0.6 MG tablet TAKE 1 TABLET DAILY AS NEEDED FOR GOUT FLARE 03/22/15  Yes Aquilla Hacker, MD   diclofenac sodium (VOLTAREN) 1 % GEL Apply 2 g topically 4 (four) times daily as needed. Patient taking differently: Apply 2 g topically 4 (four) times daily as needed (knee pain, elbow and hands).  05/12/14  Yes Aquilla Hacker, MD  enalapril (VASOTEC) 20 MG tablet Take 1 tablet (20 mg total) by mouth daily with breakfast. 08/02/14  Yes Katheren Shams, DO  fish oil-omega-3 fatty acids 1000 MG capsule Take 1 g by mouth every 30 (thirty) days.    Yes Historical Provider, MD  fluticasone (FLONASE) 50 MCG/ACT nasal spray Place 1 spray into the nose daily as needed for rhinitis. 03/17/12  Yes Waldemar Dickens, MD  hyaluronate sodium (RADIAPLEXRX) GEL Apply 1 application topically 2 (two) times daily. 04/18/15  Yes Kyung Rudd, MD  Hyprom-Naphaz-Polysorb-Zn Sulf (CLEAR EYES COMPLETE OP) Place 1 drop into both eyes daily as needed (dry eyes).    Yes Historical Provider, MD  loratadine (CLARITIN) 10 MG tablet Take 1 tablet (10 mg total) by mouth daily as needed for allergies. 09/23/12  Yes Waldemar Dickens, MD  metFORMIN (GLUCOPHAGE) 1000 MG tablet Take 1 tablet (1,000 mg total) by mouth 2 (two) times daily with a meal. Take 500mg  each morning, 1000mg  each night 05/12/14  Yes Aquilla Hacker, MD  Multiple Vitamin (MULTIVITAMIN WITH MINERALS) TABS tablet Take 1 tablet by mouth daily.   Yes Historical Provider, MD  ondansetron (ZOFRAN) 8 MG tablet Take 1 tablet (8 mg total) by mouth every 8 (eight) hours as needed for nausea or vomiting. 05/17/15  Yes Brunetta Genera, MD  tiotropium (SPIRIVA) 18 MCG inhalation capsule Place 1 capsule (18 mcg total) into inhaler and inhale as needed (if its hot outside). 08/15/14  Yes Aquilla Hacker, MD  traMADol (ULTRAM) 50 MG tablet Take 1 tablet (50 mg total) by mouth every 6 (six) hours as needed for moderate pain or severe pain. 04/12/15  Yes Brunetta Genera, MD  VITAMIN E PO Take 1 tablet by mouth daily.    Yes Historical Provider, MD   BP 105/63 mmHg  Pulse 61  Temp(Src)  98.7 F (37.1 C) (Oral)  Resp 18  SpO2 100% Physical Exam  Constitutional: She appears well-developed and well-nourished. No distress.  HENT:  Head: Normocephalic and atraumatic.  Neck: Neck supple.  Cardiovascular: Normal rate and regular rhythm.   Pulmonary/Chest: Effort normal and breath sounds normal. No respiratory distress. She has no wheezes. She has no rales.  Abdominal: Soft. She exhibits mass. There is tenderness in the right upper quadrant and epigastric area. There is no rebound and no guarding.  Neurological: She is alert.  Skin: She is not diaphoretic.  Nursing note and vitals reviewed.   ED Course  Procedures (including critical care time) Labs Review Labs Reviewed  COMPREHENSIVE METABOLIC PANEL - Abnormal; Notable for the following:    Glucose, Bld 181 (*)    All other components within normal limits  CBC - Abnormal; Notable for the following:    RDW 16.9 (*)    Platelets 137 (*)    All other components within normal limits  URINE CULTURE  LIPASE, BLOOD  URINALYSIS, ROUTINE W REFLEX MICROSCOPIC (NOT AT [facility])    Imaging Review Ct Abdomen Pelvis W Contrast  05/23/2015  CLINICAL DATA:  Nausea, intractable vomiting. EXAM: CT ABDOMEN AND PELVIS WITH CONTRAST TECHNIQUE: Multidetector CT imaging of the abdomen and pelvis was performed using the standard protocol following bolus administration of intravenous contrast. CONTRAST:  146mL OMNIPAQUE IOHEXOL 300 MG/ML  SOLN COMPARISON:  MRI 09/15/2014 FINDINGS: Linear subsegmental atelectasis in the lung bases. Heart is mildly enlarged. No effusions. Changes of partial left hepatectomy. No focal abnormality in the remaining liver. Spleen, pancreas, adrenals and kidneys are unremarkable. Prior cholecystectomy. There is wall thickening within the gastric antrum and pylorus, possibly related to gastritis. No evidence of gastric outlet obstruction. Small bowel and large bowel are decompressed and unremarkable. No free fluid, free air  or adenopathy. Aorta is normal caliber. Prior hysterectomy. No adnexal masses. Urinary bladder is unremarkable. Appendix is visualized and is normal. Diffuse degenerative changes throughout the lumbar spine. No acute bony abnormality. IMPRESSION: Partial left hepatectomy. Circumferential wall thickening in the gastric antrum and pylorus regions. This could reflect gastritis. Prior hysterectomy and cholecystectomy. Electronically Signed   By: Rolm Baptise M.D.   On: 05/23/2015 14:58     EKG Interpretation None       3:28 PM Feeling better.  PO trial.  UA pending.    MDM   Final diagnoses:  Gastritis  Non-intractable vomiting with nausea, vomiting of unspecified type    Afebrile, nontoxic patient with abdominal pain, N/V, improved with treatment in ED.  Labs reassuring.  CT abd/pelvis shows gastritis. Pt feeling much better.  Plan for PO trial, UA prior to discharge.  Pt agreeable to this plan.  Signed out to Quincy Carnes, PA-C, at change of shift.     Clayton Bibles, PA-C 05/23/15 [place], MD 05/24/15 [phone]

## 2015-05-23 NOTE — Discharge Instructions (Signed)
Read the information below.  Use the prescribed medication as directed.  Please discuss all new medications with your pharmacist.  You may return to the Emergency Department at any time for worsening condition or any new symptoms that concern you.   If you develop high fevers, worsening abdominal pain, uncontrolled vomiting, or are unable to tolerate fluids by mouth, return to the ER for a recheck.     Gastritis, Adult Gastritis is soreness and swelling (inflammation) of the lining of the stomach. Gastritis can develop as a sudden onset (acute) or long-term (chronic) condition. If gastritis is not treated, it can lead to stomach bleeding and ulcers. CAUSES  Gastritis occurs when the stomach lining is weak or damaged. Digestive juices from the stomach then inflame the weakened stomach lining. The stomach lining may be weak or damaged due to viral or bacterial infections. One common bacterial infection is the Helicobacter pylori infection. Gastritis can also result from excessive alcohol consumption, taking certain medicines, or having too much acid in the stomach.  SYMPTOMS  In some cases, there are no symptoms. When symptoms are present, they may include:  Pain or a burning sensation in the upper abdomen.  Nausea.  Vomiting.  An uncomfortable feeling of fullness after eating. DIAGNOSIS  Your caregiver may suspect you have gastritis based on your symptoms and a physical exam. To determine the cause of your gastritis, your caregiver may perform the following:  Blood or stool tests to check for the H pylori bacterium.  Gastroscopy. A thin, flexible tube (endoscope) is passed down the esophagus and into the stomach. The endoscope has a light and camera on the end. Your caregiver uses the endoscope to view the inside of the stomach.  Taking a tissue sample (biopsy) from the stomach to examine under a microscope. TREATMENT  Depending on the cause of your gastritis, medicines may be prescribed. If  you have a bacterial infection, such as an H pylori infection, antibiotics may be given. If your gastritis is caused by too much acid in the stomach, H2 blockers or antacids may be given. Your caregiver may recommend that you stop taking aspirin, ibuprofen, or other nonsteroidal anti-inflammatory drugs (NSAIDs). HOME CARE INSTRUCTIONS  Only take over-the-counter or prescription medicines as directed by your caregiver.  If you were given antibiotic medicines, take them as directed. Finish them even if you start to feel better.  Drink enough fluids to keep your urine clear or pale yellow.  Avoid foods and drinks that make your symptoms worse, such as:  Caffeine or alcoholic drinks.  Chocolate.  Peppermint or mint flavorings.  Garlic and onions.  Spicy foods.  Citrus fruits, such as oranges, lemons, or limes.  Tomato-based foods such as sauce, chili, salsa, and pizza.  Fried and fatty foods.  Eat small, frequent meals instead of large meals. SEEK IMMEDIATE MEDICAL CARE IF:   You have black or dark red stools.  You vomit blood or material that looks like coffee grounds.  You are unable to keep fluids down.  Your abdominal pain gets worse.  You have a fever.  You do not feel better after 1 week.  You have any other questions or concerns. MAKE SURE YOU:  Understand these instructions.  Will watch your condition.  Will get help right away if you are not doing well or get worse.   This information is not intended to replace advice given to you by your health care provider. Make sure you discuss any questions you have with  your health care provider.   Document Released: 03/19/2001 Document Revised: 09/24/2011 Document Reviewed: 05/08/2011 Elsevier Interactive Patient Education [employer].

## 2015-05-23 NOTE — Progress Notes (Signed)
HPI: The patient is briefly seen again, and in summary she presented to the emergency department yesterday, however was dissatisfied with having to wait to be seen and subsequently left. She states that she continues to have nausea and intractable vomiting after eating or drinking. She is only keeping a small amount of the liquids that she is able to take in down. She states that she has not urinating frequently and this is darker than it had previously been. She denies any increasing abdominal pain but states that she still gets waves of crampy pain in her abdomen and states that her abdomen is more distended even than yesterday. She denies any chest pain, shortness of breath, fevers or chills.  PE: Vitals are recorded and can be referenced in Epic. In general this is a fatigued-appearing African-American female in no acute distress. She's alert and oriented 4 and appropriate throughout the examination. Cardiovascular exam reveals a regular rate and rhythm, no clicks rubs or murmurs are auscultated. Chest is clear to auscultation bilaterally. The abdomen is distended with absent bowel sounds in all quadrants, and is soft, mildly tender over the Epigastrium without any palpable fascial defects.  Impression/plan: I discussed my concerns with patient again that she may have ileus versus small bowel obstruction. Imaging would be definitive in helping to discern this. She has been unable to continue her scheduled Zofran but even while she was able to tolerate keeping this down, she was not successful in managing her nausea or her emesis. Her labs from yesterday were reviewed and although her BUN and creatinine are normal, I'm concerned that she is going to need some additional support since she has been unsuccessful in keeping down food since Friday evening. I've discussed her case with Danise Mina, charge nurse at [facility] ED. They will do their best to the patient in to be seen as quickly as possible and the  patient is interested in having this assessment. Is understand that she may require hospitalization if an obstruction is identified.  Carola Rhine, PAC

## 2015-05-23 NOTE — ED Notes (Signed)
Pt states that she is being sent by cancer center.  Pt is a chemo pt.  Last chemo tx was yesterday.  Pt states that she is having mid abd pain with NV since Saturday.  PA states to read her note.  States she was here yesterday but was waiting in a room too long.

## 2015-05-23 NOTE — ED Notes (Signed)
Patient transported to CT 

## 2015-05-23 NOTE — ED Provider Notes (Signed)
Patient received in sign out from [place] at shift change.  Please see her note for full H&P.  Briefly, 70 y.o F with hx of cholangiocarcinoma here for abdominal pain, nausea, and vomiting.  No fever, chills.  Patient on oral chemo and radiation which she had yesterday.  Work up thus far reassuring including labs and CT scan which showed gastritis.  Nausea and vomiting improved with medications here.  Plan:  U/A pending.  Will allow PO trial, anticipate discharge if tolerating well.  Results for orders placed or performed during the hospital encounter of 05/23/15  Lipase, blood  Result Value Ref Range   Lipase 32 11 - 51 U/L  Comprehensive metabolic panel  Result Value Ref Range   Sodium 138 135 - 145 mmol/L   Potassium 3.9 3.5 - 5.1 mmol/L   Chloride 103 101 - 111 mmol/L   CO2 25 22 - 32 mmol/L   Glucose, Bld 181 (H) 65 - 99 mg/dL   BUN 11 6 - 20 mg/dL   Creatinine, Ser 0.69 0.44 - 1.00 mg/dL   Calcium 9.1 8.9 - 10.3 mg/dL   Total Protein 7.2 6.5 - 8.1 g/dL   Albumin 3.8 3.5 - 5.0 g/dL   AST 21 15 - 41 U/L   ALT 14 14 - 54 U/L   Alkaline Phosphatase 71 38 - 126 U/L   Total Bilirubin 0.5 0.3 - 1.2 mg/dL   GFR calc non Af Amer >60 >60 mL/min   GFR calc Af Amer >60 >60 mL/min   Anion gap 10 5 - 15  CBC  Result Value Ref Range   WBC 5.8 4.0 - 10.5 K/uL   RBC 4.36 3.87 - 5.11 MIL/uL   Hemoglobin 12.6 12.0 - 15.0 g/dL   HCT 39.2 36.0 - 46.0 %   MCV 89.9 78.0 - 100.0 fL   MCH 28.9 26.0 - 34.0 pg   MCHC 32.1 30.0 - 36.0 g/dL   RDW 16.9 (H) 11.5 - 15.5 %   Platelets 137 (L) 150 - 400 K/uL  Urinalysis, Routine w reflex microscopic (not at [facility])  Result Value Ref Range   Color, Urine YELLOW YELLOW   APPearance CLOUDY (A) CLEAR   Specific Gravity, Urine 1.045 (H) 1.005 - 1.030   pH 6.0 5.0 - 8.0   Glucose, UA NEGATIVE NEGATIVE mg/dL   Hgb urine dipstick NEGATIVE NEGATIVE   Bilirubin Urine NEGATIVE NEGATIVE   Ketones, ur NEGATIVE NEGATIVE mg/dL   Protein, ur NEGATIVE NEGATIVE  mg/dL   Nitrite NEGATIVE NEGATIVE   Leukocytes, UA NEGATIVE NEGATIVE   Ct Abdomen Pelvis W Contrast  05/23/2015  CLINICAL DATA:  Nausea, intractable vomiting. EXAM: CT ABDOMEN AND PELVIS WITH CONTRAST TECHNIQUE: Multidetector CT imaging of the abdomen and pelvis was performed using the standard protocol following bolus administration of intravenous contrast. CONTRAST:  159mL OMNIPAQUE IOHEXOL 300 MG/ML  SOLN COMPARISON:  MRI 09/15/2014 FINDINGS: Linear subsegmental atelectasis in the lung bases. Heart is mildly enlarged. No effusions. Changes of partial left hepatectomy. No focal abnormality in the remaining liver. Spleen, pancreas, adrenals and kidneys are unremarkable. Prior cholecystectomy. There is wall thickening within the gastric antrum and pylorus, possibly related to gastritis. No evidence of gastric outlet obstruction. Small bowel and large bowel are decompressed and unremarkable. No free fluid, free air or adenopathy. Aorta is normal caliber. Prior hysterectomy. No adnexal masses. Urinary bladder is unremarkable. Appendix is visualized and is normal. Diffuse degenerative changes throughout the lumbar spine. No acute bony abnormality. IMPRESSION: Partial  left hepatectomy. Circumferential wall thickening in the gastric antrum and pylorus regions. This could reflect gastritis. Prior hysterectomy and cholecystectomy. Electronically Signed   By: Rolm Baptise M.D.   On: 05/23/2015 14:58    6:31 PM Patient reassessed.  States she is feeling fine currently.  She has tolerated PO without difficulty.  U/A without signs of infection-- results discussed with patient, she acknowledged understanding.  VS remain stable.  Patient appears stable for discharge at this time.  D/c home with scripts and follow-up instructions per PA [facility].  Larene Pickett, PA-C 05/23/15 2312  Gareth Morgan, MD 05/24/15 [phone]

## 2015-05-23 NOTE — ED Notes (Signed)
ED PA at bedside

## 2015-05-23 NOTE — Progress Notes (Addendum)
Patient came to nursing before treatment today, still c/o nausea, throwing up last nihgt, ate oatmeal this am, took nausea med and xanax this am   Not sure if she wants tx today, hasn't taken xeloda today,  11:01 AM BP 107/48 mmHg  Pulse 64  Temp(Src) 97.8 F (36.6 C) (Oral)  Resp 20  SpO2 100%  Wt Readings from Last 3 Encounters:  05/22/15 191 lb (86.637 kg)  05/19/15 191 lb 8 oz (86.864 kg)  05/17/15 192 lb 14.4 oz (87.499 kg)

## 2015-05-23 NOTE — ED Notes (Signed)
Pt aware of need for urine sample. Pt is unable to go at this time.

## 2015-05-23 NOTE — ED Notes (Signed)
EDPA EMILY  at bedside.

## 2015-05-23 NOTE — Progress Notes (Signed)
Patient came to nursing before treatment today, still c/o nausea, throwing up last night x 1 no bile, , ate oatmeal this am, took nausea med and xanax this am   Not sure if she wants tx today, hasn't taken xeloda today, wants to be admitted to hospital today, left ED yesterday she stated after waiting 3 hours, asked Shona Simpson to see patient 11:07 AM There were no vitals taken for this visit.  Wt Readings from Last 3 Encounters:  05/22/15 191 lb (86.637 kg)  05/19/15 191 lb 8 oz (86.864 kg)  05/17/15 192 lb 14.4 oz (87.499 kg)

## 2015-05-24 ENCOUNTER — Ambulatory Visit: Payer: Medicare HMO

## 2015-05-24 ENCOUNTER — Ambulatory Visit
Admission: RE | Admit: 2015-05-24 | Discharge: 2015-05-24 | Disposition: A | Discharge status: Home / Self Care | Payer: Medicare HMO | Source: Ambulatory Visit | Attending: Radiation Oncology | Admitting: Radiation Oncology

## 2015-05-24 DIAGNOSIS — Z510 Encounter for antineoplastic radiation therapy: Secondary | ICD-10-CM | POA: Diagnosis not present

## 2015-05-24 MED FILL — FAMOTIDINE 20 MG TABLET: 20 | 15 days supply | Qty: 30 | Fill #0

## 2015-05-24 MED FILL — METOCLOPRAMIDE 10 MG TABLET: 10 | 6 days supply | Qty: 20 | Fill #0

## 2015-05-25 ENCOUNTER — Ambulatory Visit
Admission: RE | Admit: 2015-05-25 | Discharge: 2015-05-25 | Disposition: A | Discharge status: Home / Self Care | Payer: Medicare HMO | Source: Ambulatory Visit | Attending: Radiation Oncology | Admitting: Radiation Oncology

## 2015-05-25 ENCOUNTER — Encounter: Payer: Self-pay | Admitting: Radiation Oncology

## 2015-05-25 ENCOUNTER — Ambulatory Visit: Payer: Medicare HMO | Admitting: Radiation Oncology

## 2015-05-25 DIAGNOSIS — Z510 Encounter for antineoplastic radiation therapy: Secondary | ICD-10-CM | POA: Diagnosis not present

## 2015-05-25 LAB — URINE CULTURE

## 2015-05-26 ENCOUNTER — Encounter: Payer: Self-pay | Admitting: Radiation Oncology

## 2015-05-26 ENCOUNTER — Ambulatory Visit
Admission: RE | Admit: 2015-05-26 | Discharge: 2015-05-26 | Disposition: A | Discharge status: Home / Self Care | Payer: Medicare HMO | Source: Ambulatory Visit | Attending: Radiation Oncology | Admitting: Radiation Oncology

## 2015-05-26 ENCOUNTER — Telehealth: Payer: Self-pay | Admitting: *Deleted

## 2015-05-26 VITALS — BP 92/55 | HR 71 | Temp 98.1°F | Ht 63.0 in | Wt 188.6 lb

## 2015-05-26 DIAGNOSIS — C221 Intrahepatic bile duct carcinoma: Secondary | ICD-10-CM

## 2015-05-26 DIAGNOSIS — Z510 Encounter for antineoplastic radiation therapy: Secondary | ICD-10-CM | POA: Diagnosis not present

## 2015-05-26 NOTE — Telephone Encounter (Signed)
  Oncology Nurse Navigator Documentation  Navigator Location: [facility] On[facility] (05/26/15 1130) Navigator Encounter Type: Telephone (05/26/15 1130) Telephone: Incoming Call (05/26/15 1130) : Call from triage nurse at [facility] responding to message left by navigator on 05/18/15. OK for CT C/A/P to be done in [place]. Patient needs to bring copy of report and scan on CD to her [facility] follow up. Scan needs to be done 1 month after her RT/chemo are completed. Forwarded to Dr. Grier Mitts nurse for her visit on 05/31/15.

## 2015-05-26 NOTE — Progress Notes (Signed)
BP 89/49 mmHg  Pulse 64  Temp(Src) 98.1 F (36.7 C)  Ht 5\' 3"  (1.6 m)  Wt 188 lb 9.6 oz (85.548 kg)  BMI 33.42 kg/m2 - Sitting  BP 92/55 mmHg  Pulse 71  Temp(Src) 98.1 F (36.7 C)  Ht 5\' 3"  (1.6 m)  Wt 188 lb 9.6 oz (85.548 kg)  BMI 33.42 kg/m2 - Standing  Wt Readings from Last 3 Encounters:  05/26/15 188 lb 9.6 oz (85.548 kg)  05/22/15 191 lb (86.637 kg)  05/19/15 191 lb 8 oz (86.864 kg)    Bonnie Peterson has completed XRT to her Abdomen.  Denies any abdominal pain today.  Diarrhea, on average x 2 daily and denies any rectal irritation, using Drinking water and only 2 glucerna's daily, so encouraged 4  Cans daily.  Has lost 3 lbs since 05/22/15.

## 2015-05-28 NOTE — Progress Notes (Signed)
Department of Radiation Oncology  Phone:  [phone] Fax:        [phone]  Weekly Treatment Note    Name: Bonnie Peterson Date: 05/28/2015 MRN: [id number] DOB: 15-Aug-1945   Diagnosis:     ICD-9-CM ICD-10-CM   1. Intrahepatic cholangiocarcinoma (HCC) 155.1 C22.1      Current dose: 50.4 Gy  Current fraction: 28   MEDICATIONS: Current Outpatient Prescriptions  Medication Sig Dispense Refill  . ACCU-CHEK AVIVA PLUS test strip 1 each by Other route daily.     [name] albuterol (PROAIR HFA) 108 (90 BASE) MCG/ACT inhaler INHALE 2 PUFFS INTO THE LUNGS EVERY 4 HOURS AS NEEDED FOR WHEEZING OR SHORTNESS OF BREATH 1 Inhaler 0  . alendronate (FOSAMAX) 70 MG tablet TAKE 1 TABLET  ONCE A WEEK. (Patient taking differently: TAKE 1 TABLET  ONCE A WEEK.--Sunday) 12 tablet 2  . amLODipine (NORVASC) 10 MG tablet TAKE 1 TABLET (10 MG TOTAL) BY MOUTH DAILY WITH BREAKFAST. 90 tablet 3  . aspirin 81 MG chewable tablet Chew 81 mg by mouth daily. Reported on 04/28/2015    . atenolol (TENORMIN) 50 MG tablet Take 1 tablet (50 mg total) by mouth daily. 90 tablet 3  . baclofen (LIORESAL) 10 MG tablet Take 1 tablet (10 mg total) by mouth 3 (three) times daily as needed for muscle spasms. 90 each 11  . cholecalciferol (VITAMIN D) 1000 UNITS tablet Take 1 tablet (1,000 Units total) by mouth daily. 30 tablet 11  . colchicine 0.6 MG tablet TAKE 1 TABLET DAILY AS NEEDED FOR GOUT FLARE 30 tablet 1  . diclofenac sodium (VOLTAREN) 1 % GEL Apply 2 g topically 4 (four) times daily as needed. (Patient taking differently: Apply 2 g topically 4 (four) times daily as needed (knee pain, elbow and hands). ) 100 g 4  . enalapril (VASOTEC) 20 MG tablet Take 1 tablet (20 mg total) by mouth daily with breakfast. 90 tablet 3  . famotidine (PEPCID) 20 MG tablet Take 1 tablet (20 mg total) by mouth 2 (two) times daily. 30 tablet 0  . fish oil-omega-3 fatty acids 1000 MG capsule Take 1 g by mouth every 30 (thirty) days.     .  fluticasone (FLONASE) 50 MCG/ACT nasal spray Place 1 spray into the nose daily as needed for rhinitis.    . hyaluronate sodium (RADIAPLEXRX) GEL Apply 1 application topically 2 (two) times daily.    [name] loratadine (CLARITIN) 10 MG tablet Take 1 tablet (10 mg total) by mouth daily as needed for allergies. 30 tablet 3  . metFORMIN (GLUCOPHAGE) 1000 MG tablet Take 1 tablet (1,000 mg total) by mouth 2 (two) times daily with a meal. Take 500mg  each morning, 1000mg  each night 180 tablet 3  . metoCLOPramide (REGLAN) 10 MG tablet Take 1 tablet (10 mg total) by mouth every 8 (eight) hours as needed for nausea or vomiting. 20 tablet 0  . Multiple Vitamin (MULTIVITAMIN WITH MINERALS) TABS tablet Take 1 tablet by mouth daily.    . ondansetron (ZOFRAN) 8 MG tablet Take 1 tablet (8 mg total) by mouth every 8 (eight) hours as needed for nausea or vomiting. 30 tablet 1  . tiotropium (SPIRIVA) 18 MCG inhalation capsule Place 1 capsule (18 mcg total) into inhaler and inhale as needed (if its hot outside). 30 capsule 5  . traMADol (ULTRAM) 50 MG tablet Take 1 tablet (50 mg total) by mouth every 6 (six) hours as needed for moderate pain or severe pain. 30 tablet 0  .  VITAMIN E PO Take 1 tablet by mouth daily.     . capecitabine (XELODA) 500 MG tablet Take 3 tablets (1,500 mg total) by mouth 2 (two) times daily after a meal. Monday through Friday while getting radiation therapy. (Patient not taking: Reported on 05/26/2015) 48 tablet 0  . Hyprom-Naphaz-Polysorb-Zn Sulf (CLEAR EYES COMPLETE OP) Place 1 drop into both eyes daily as needed (dry eyes).      No current facility-administered medications for this encounter.     ALLERGIES: Other   LABORATORY DATA:  Lab Results  Component Value Date   WBC 5.8 05/23/2015   HGB 12.6 05/23/2015   HCT 39.2 05/23/2015   MCV 89.9 05/23/2015   PLT 137* 05/23/2015   Lab Results  Component Value Date   NA 138 05/23/2015   K 3.9 05/23/2015   CL 103 05/23/2015   CO2 25  05/23/2015   Lab Results  Component Value Date   ALT 14 05/23/2015   AST 21 05/23/2015   ALKPHOS 71 05/23/2015   BILITOT 0.5 05/23/2015     NARRATIVE: Bonnie Peterson was seen today for weekly treatment management. The chart was checked and the patient's films were reviewed.  BP 92/55 mmHg  Pulse 71  Temp(Src) 98.1 F (36.7 C)  Ht 5\' 3"  (1.6 m)  Wt 188 lb 9.6 oz (85.548 kg)  BMI 33.42 kg/m2 - Sitting  BP 92/55 mmHg  Pulse 71  Temp(Src) 98.1 F (36.7 C)  Ht 5\' 3"  (1.6 m)  Wt 188 lb 9.6 oz (85.548 kg)  BMI 33.42 kg/m2 - Standing  Wt Readings from Last 3 Encounters:  05/26/15 188 lb 9.6 oz (85.548 kg)  05/22/15 191 lb (86.637 kg)  05/19/15 191 lb 8 oz (86.864 kg)    Bonnie Peterson has completed XRT to her Abdomen.  Denies any abdominal pain today.  Diarrhea, on average x 2 daily and denies any rectal irritation, using Drinking water and only 2 glucerna's daily, so encouraged 4  Cans daily.  Has lost 3 lbs since 05/22/15.   PHYSICAL EXAMINATION: height is 5\' 3"  (1.6 m) and weight is 188 lb 9.6 oz (85.548 kg). Her temperature is 98.1 F (36.7 C). Her blood pressure is 92/55 and her pulse is 71.        ASSESSMENT: The patient is doing satisfactorily with treatment.  PLAN: We will continue with the patient's radiation treatment as planned. The patient completed her radiation treatment today. She will follow-up in one month. I'm very pleased with how the patient finished treatment without any major difficulties.

## 2015-05-31 ENCOUNTER — Encounter: Payer: Self-pay | Admitting: Hematology

## 2015-05-31 ENCOUNTER — Other Ambulatory Visit (HOSPITAL_BASED_OUTPATIENT_CLINIC_OR_DEPARTMENT_OTHER): Payer: Medicare HMO

## 2015-05-31 ENCOUNTER — Ambulatory Visit (HOSPITAL_BASED_OUTPATIENT_CLINIC_OR_DEPARTMENT_OTHER): Payer: Medicare HMO | Admitting: Hematology

## 2015-05-31 ENCOUNTER — Ambulatory Visit (HOSPITAL_BASED_OUTPATIENT_CLINIC_OR_DEPARTMENT_OTHER): Payer: Medicare HMO

## 2015-05-31 VITALS — BP 124/51 | HR 72

## 2015-05-31 VITALS — BP 127/58 | HR 89 | Temp 98.7°F | Resp 18 | Ht 63.0 in | Wt 185.0 lb

## 2015-05-31 DIAGNOSIS — G43A0 Cyclical vomiting, not intractable: Secondary | ICD-10-CM

## 2015-05-31 DIAGNOSIS — R112 Nausea with vomiting, unspecified: Secondary | ICD-10-CM | POA: Insufficient documentation

## 2015-05-31 DIAGNOSIS — B370 Candidal stomatitis: Secondary | ICD-10-CM | POA: Diagnosis not present

## 2015-05-31 DIAGNOSIS — C221 Intrahepatic bile duct carcinoma: Secondary | ICD-10-CM | POA: Diagnosis not present

## 2015-05-31 DIAGNOSIS — E860 Dehydration: Secondary | ICD-10-CM | POA: Diagnosis not present

## 2015-05-31 DIAGNOSIS — R110 Nausea: Secondary | ICD-10-CM

## 2015-05-31 DIAGNOSIS — R1115 Cyclical vomiting syndrome unrelated to migraine: Secondary | ICD-10-CM

## 2015-05-31 DIAGNOSIS — K2960 Other gastritis without bleeding: Secondary | ICD-10-CM | POA: Insufficient documentation

## 2015-05-31 LAB — COMPREHENSIVE METABOLIC PANEL
ALT: 15 U/L (ref 0–55)
AST: 19 U/L (ref 5–34)
Albumin: 3.3 g/dL — ABNORMAL LOW (ref 3.5–5.0)
Alkaline Phosphatase: 77 U/L (ref 40–150)
Anion Gap: 10 mEq/L (ref 3–11)
BUN: 9 mg/dL (ref 7.0–26.0)
CO2: 27 mEq/L (ref 22–29)
Calcium: 9.2 mg/dL (ref 8.4–10.4)
Chloride: 103 mEq/L (ref 98–109)
Creatinine: 0.8 mg/dL (ref 0.6–1.1)
EGFR: >90 mL/min/{1.73_m2} (ref >90)
Glucose: 156 mg/dl — ABNORMAL HIGH (ref 70–140)
Potassium: 3.7 mEq/L (ref 3.5–5.1)
Sodium: 140 mEq/L (ref 136–145)
Total Bilirubin: 0.75 mg/dL (ref 0.20–1.20)
Total Protein: 7 g/dL (ref 6.4–8.3)

## 2015-05-31 LAB — CBC & DIFF AND RETIC
BASO%: 0.2 % (ref 0.0–2.0)
Basophils Absolute: 0 10*3/uL (ref 0.0–0.1)
EOS%: 0.7 % (ref 0.0–7.0)
Eosinophils Absolute: 0 10*3/uL (ref 0.0–0.5)
HCT: 38.2 % (ref 34.8–46.6)
HGB: 12.5 g/dL (ref 11.6–15.9)
Immature Retic Fract: 8.3 % (ref 1.60–10.00)
LYMPH%: 12.9 % — ABNORMAL LOW (ref 14.0–49.7)
MCH: 29.2 pg (ref 25.1–34.0)
MCHC: 32.7 g/dL (ref 31.5–36.0)
MCV: 89.3 fL (ref 79.5–101.0)
MONO#: 0.5 10*3/uL (ref 0.1–0.9)
MONO%: 11.9 % (ref 0.0–14.0)
NEUT#: 3.1 10*3/uL (ref 1.5–6.5)
NEUT%: 74.3 % (ref 38.4–76.8)
Platelets: 110 10*3/uL — ABNORMAL LOW (ref 145–400)
RBC: 4.28 10*6/uL (ref 3.70–5.45)
RDW: 18.5 % — ABNORMAL HIGH (ref 11.2–14.5)
Retic %: 2.27 % — ABNORMAL HIGH (ref 0.70–2.10)
Retic Ct Abs: 97.16 10*3/uL — ABNORMAL HIGH (ref 33.70–90.70)
WBC: 4.1 10*3/uL (ref 3.9–10.3)
lymph#: 0.5 10*3/uL — ABNORMAL LOW (ref 0.9–3.3)

## 2015-05-31 MED ORDER — DEXTROSE-NACL 5-0.9 % IV SOLN
INTRAVENOUS | Status: AC
  Administered 2015-05-31: 11:00:00 via INTRAVENOUS

## 2015-05-31 MED ORDER — SUCRALFATE 1 GM/10ML PO SUSP: 1.0000 g | Freq: Three times a day (TID) | ORAL | Status: DC

## 2015-05-31 MED ORDER — OMEPRAZOLE 40 MG PO CPDR: 40.0000 mg | DELAYED_RELEASE_CAPSULE | Freq: Two times a day (BID) | ORAL | Status: DC

## 2015-05-31 MED ORDER — SODIUM CHLORIDE 0.9 % IV SOLN
Freq: Once | INTRAVENOUS | Status: AC
  Administered 2015-05-31: 10:00:00 via INTRAVENOUS

## 2015-05-31 MED ORDER — DEXAMETHASONE 4 MG PO TABS: 4.0000 mg | ORAL_TABLET | Freq: Two times a day (BID) | ORAL | Status: DC

## 2015-05-31 MED ORDER — SCOPOLAMINE 1 MG/3DAYS TD PT72: 1.0000 | MEDICATED_PATCH | TRANSDERMAL | Status: DC

## 2015-05-31 MED ORDER — NYSTATIN 100000 UNIT/ML MT SUSP: 5.0000 mL | Freq: Four times a day (QID) | OROMUCOSAL | Status: DC

## 2015-05-31 MED ORDER — PANTOPRAZOLE SODIUM 40 MG IV SOLR
40.0000 mg | Freq: Once | INTRAVENOUS | Status: AC
Start: 2015-05-31 — End: 2015-05-31
  Administered 2015-05-31: 40 mg via INTRAVENOUS
  Filled 2015-05-31: qty 40

## 2015-05-31 MED ORDER — SODIUM CHLORIDE 0.9 % IV SOLN
Freq: Once | INTRAVENOUS | Status: AC
  Administered 2015-05-31: 11:00:00 via INTRAVENOUS
  Filled 2015-05-31: qty 5

## 2015-05-31 MED FILL — DEXAMETHASONE 4 MG TABLET: 4 | 15 days supply | Qty: 30 | Fill #0

## 2015-05-31 MED FILL — OMEPRAZOLE DR 40 MG CAPSULE: 40 | 30 days supply | Qty: 60 | Fill #0

## 2015-05-31 MED FILL — CARAFATE 1 GM/10 ML SUSP: 1 | 10 days supply | Qty: 420 | Fill #0

## 2015-05-31 MED FILL — TRANSDERM-SCOP 1.5 MG/3 DAY: 1 | 15 days supply | Qty: 5 | Fill #0

## 2015-05-31 MED FILL — NYSTATIN 100,000 UNITS/ML S: 100000 | 14 days supply | Qty: 280 | Fill #0

## 2015-05-31 NOTE — Patient Instructions (Signed)

## 2015-05-31 NOTE — Progress Notes (Signed)
[name]    HEMATOLOGY/ONCOLOGY CLINIC NOTE  Date of Service: .05/31/2015   Patient Care Team: Aquilla Hacker, MD as PCP - General  CHIEF COMPLAINTS/PURPOSE OF CONSULTATION:   F/u for Cholangiocarcinoma s/p completion of concurrent chemo-Radiation.  DIAGNOSIS  Stage IVA (T2a, pN1, M0) Intrahepatic Cholangiocarcinoma  Current Treatment  -Concurrent Capecitabine 862m/m2 po BID M-F while on concurrent RT Final date of RT planned for 05/25/2015  Previous treatment 02/09/15 L lobe of liver partial hepatectomy segments 2, 3, 4a, 4b invasive cholangiocarcinoma, moderately differentiated. Tumor focally extends to the parenchymal margin of resection. Uninvolved liver parenchyma with steatohepatitis and associated periportal and centrilobular pericellular fibrosis. Scattered von Meyenburg complexes. One hepatic artery LN with metastatic cholangiocarcinoma. Fibroadipose tissue and nerve negative for malignancy. Gallbladder negative for malignancy. One LN negative for malignancy. T2aN1 invasive cholangiocarcinoma, moderately differentiated, with positive hepatic parenchymal margin, and 1/2 nodes positive for tumor involvement.    Interval History  Bonnie Peterson here for her scheduled follow-up. She completed her concurrent chemoradiation on 05/25/2015. She notes that she had visited the emergency room on 05/23/2015 for persistent nausea. She had a CT scan of the abdomen that was concerning for radiation gastritis with some pyloric inflammation as well . She did inform [place] this until today. She notes that she has had some persistent nausea despite taking her Zofran and Reglan and has not been able to keep much food down .   She has had a few episodes of vomiting clear liquids no hematemesis. No evidence of GI bleeding .has been having bowel movements .no diarrhea .no fevers or chills .notes that she feels more nauseous when she lays flat in bed .no dizziness no presyncopal symptoms . Notes that she  feels dehydrated and weak .  MEDICAL HISTORY:  Past Medical History  Diagnosis Date  . Obstructive sleep apnea   . HTN, goal below 130/80   . HLD (hyperlipidemia)   . Benign positional vertigo   . Depression   . Anxiety   . Irritable bowel syndrome   . Obesity, Class III, BMI 40-49.9 (morbid obesity) ([facility]   . Gout   . Restrictive lung disease   . Echocardiogram findings abnormal, without diagnosis 10/10    10/10: mild pulm HTN, EF 60-65%, mild LVH, moderate aortic regurg  . Early cataracts, bilateral 10/13    Optho, Bonnie [facility] . COPD (chronic obstructive pulmonary disease) ([facility]   . Osteoarthritis (arthritis due to wear and tear of joints)     also gout  . Cancer ([facility] 02/09/15    intrahepatic cholangiocarcinoma   SURGICAL HISTORY: Past Surgical History  Procedure Laterality Date  . Partial hysterectomy    . Rotator cuff repair        L rotator cuff repair 11/07-[facility] - 12/7/200  . Tonsillectomy    . Liver biopsy    . Abdominal hysterectomy    . Open partial hepatectomy [83] Left 02/09/15    SOCIAL HISTORY: Social History   Social History  . Marital Status: Single    Spouse Name: N/A  . Number of Children: 4  . Years of Education: N/A   Occupational History  . unemployed    Social History Main Topics  . Smoking status: Current Some Day Smoker -- 0.50 packs/day for 40 years    Types: Cigarettes  . Smokeless tobacco: Never Used  . Alcohol Use: 0.0 oz/week    0 Standard drinks or equivalent per week     Comment: occasionally  . Drug  Use: No  . Sexual Activity: Not on file   Other Topics Concern  . 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 Breast cancer Mother   . Hypertension Mother   . Coronary artery disease Mother   . Diabetes type II Sister   . Pancreatic cancer Sister   . Diabetes type II Mother     ALLERGIES:  is allergic to other.  MEDICATIONS:  Current Outpatient Prescriptions  Medication Sig Dispense Refill  . ACCU-CHEK AVIVA PLUS test strip 1 each by Other route daily.     [name] albuterol (PROAIR HFA) 108 (90 BASE) MCG/ACT inhaler INHALE 2 PUFFS INTO THE LUNGS EVERY 4 HOURS AS NEEDED FOR WHEEZING OR SHORTNESS OF BREATH 1 Inhaler 0  . alendronate (FOSAMAX) 70 MG tablet TAKE 1 TABLET  ONCE A WEEK. (Patient taking differently: TAKE 1 TABLET  ONCE A WEEK.--Sunday) 12 tablet 2  . amLODipine (NORVASC) 10 MG tablet TAKE 1 TABLET (10 MG TOTAL) BY MOUTH DAILY WITH BREAKFAST. 90 tablet 3  . aspirin 81 MG chewable tablet Chew 81 mg by mouth daily. Reported on 04/28/2015    . atenolol (TENORMIN) 50 MG tablet Take 1 tablet (50 mg total) by mouth daily. 90 tablet 3  . baclofen (LIORESAL) 10 MG tablet Take 1 tablet (10 mg total) by mouth 3 (three) times daily as needed for muscle spasms. 90 each 11  . capecitabine (XELODA) 500 MG tablet Take 3 tablets (1,500 mg total) by mouth 2 (two) times daily after a meal. Monday through Friday while getting radiation therapy. (Patient not taking: Reported on 05/26/2015) 48 tablet 0  . cholecalciferol (VITAMIN D) 1000 UNITS tablet Take 1 tablet (1,000 Units total) by mouth daily. 30 tablet 11  . colchicine 0.6 MG tablet TAKE 1 TABLET DAILY AS NEEDED FOR GOUT FLARE 30 tablet 1  . dexamethasone (DECADRON) 4 MG tablet Take 1 tablet (4 mg total) by mouth 2 (two) times daily with breakfast and lunch. 30 tablet 0  . diclofenac sodium (VOLTAREN) 1 % GEL Apply 2 g topically 4 (four) times daily as needed. (Patient taking differently: Apply 2 g topically 4 (four) times daily as needed (knee pain, elbow and hands). ) 100 g 4  . enalapril (VASOTEC)  20 MG tablet Take 1 tablet (20 mg total) by mouth daily with breakfast. 90 tablet 3  . famotidine (PEPCID) 20 MG tablet Take 1 tablet (20 mg total) by mouth 2 (two) times daily. 30 tablet 0  . fish oil-omega-3 fatty acids 1000 MG capsule Take 1 g by mouth every 30 (thirty) days.     . fluticasone (FLONASE) 50 MCG/ACT nasal spray Place 1 spray into the nose daily as needed for rhinitis.    . hyaluronate sodium (RADIAPLEXRX) GEL Apply 1 application topically 2 (two) times daily.    . Hyprom-Naphaz-Polysorb-Zn Sulf (CLEAR EYES COMPLETE OP) Place 1 drop into both eyes daily as needed (dry eyes).     [name] loratadine (CLARITIN) 10 MG tablet Take 1 tablet (10 mg total) by mouth daily as needed  for allergies. 30 tablet 3  . metFORMIN (GLUCOPHAGE) 1000 MG tablet Take 1 tablet (1,000 mg total) by mouth 2 (two) times daily with a meal. Take 527m each morning, 10087meach night 180 tablet 3  . metoCLOPramide (REGLAN) 10 MG tablet Take 1 tablet (10 mg total) by mouth every 8 (eight) hours as needed for nausea or vomiting. 20 tablet 0  . Multiple Vitamin (MULTIVITAMIN WITH MINERALS) TABS tablet Take 1 tablet by mouth daily.    . Marland Kitchenystatin (MYCOSTATIN) 100000 UNIT/ML suspension Take 5 mLs (500,000 Units total) by mouth 4 (four) times daily. 280 mL 0  . omeprazole (PRILOSEC) 40 MG capsule Take 1 capsule (40 mg total) by mouth 2 (two) times daily before a meal. 60 capsule 1  . ondansetron (ZOFRAN) 8 MG tablet Take 1 tablet (8 mg total) by mouth every 8 (eight) hours as needed for nausea or vomiting. 30 tablet 1  . scopolamine (TRANSDERM-SCOP) 1 MG/3DAYS Place 1 patch (1.5 mg total) onto the skin every 3 (three) days. 5 patch 0  . sucralfate (CARAFATE) 1 GM/10ML suspension Take 10 mLs (1 g total) by mouth 4 (four) times daily -  with meals and at bedtime. 420 mL 0  . tiotropium (SPIRIVA) 18 MCG inhalation capsule Place 1 capsule (18 mcg total) into inhaler and inhale as needed (if its hot outside). 30 capsule 5  .  traMADol (ULTRAM) 50 MG tablet Take 1 tablet (50 mg total) by mouth every 6 (six) hours as needed for moderate pain or severe pain. 30 tablet 0  . VITAMIN E PO Take 1 tablet by mouth daily.      No current facility-administered medications for this visit.    REVIEW OF SYSTEMS:    10 Point review of Systems was done is negative except as noted above.  PHYSICAL EXAMINATION: ECOG PERFORMANCE STATUS: 1 - Symptomatic but completely ambulatory  . Filed Vitals:   05/31/15 0922  BP: 127/58  Pulse: 89  Temp: 98.7 F (37.1 C)  Resp: 18   Filed Weights   05/31/15 0922  Weight: 185 lb (83.915 kg)   .Body mass index is 32.78 kg/(m^2).  GENERAL:alert, in no acute distress and comfortable SKIN: skin color, texture, turgor are normal, no rashes or significant lesions EYES: normal, conjunctiva are pink and non-injected, sclera clear OROPHARYNX: Mucous membranes dry , significant oropharyngeal thrush noted. NECK: supple, no JVD, thyroid normal size, non-tender, without nodularity LYMPH:  no palpable lymphadenopathy in the cervical, axillary or inguinal LUNGS: clear to auscultation with normal respiratory effort HEART: regular rate & rhythm,  no murmurs and no lower extremity edema ABDOMEN: abdomen obese, soft, non-tender, normoactive bowel sounds , healed surgical wounds Musculoskeletal: no cyanosis of digits and no clubbing  PSYCH: alert & oriented x 3 with fluent speech NEURO: no focal motor/sensory deficits  LABORATORY DATA:  . CBC Latest Ref Rng 05/31/2015 05/23/2015 05/22/2015  WBC 3.9 - 10.3 10e3/uL 4.1 5.8 5.1  Hemoglobin 11.6 - 15.9 g/dL 12.5 12.6 11.6(L)  Hematocrit 34.8 - 46.6 % 38.2 39.2 36.1  Platelets 145 - 400 10e3/uL 110(L) 137(L) 123(L)   . CMP Latest Ref Rng 05/31/2015 05/23/2015 05/22/2015  Glucose 70 - 140 mg/dl 156(H) 181(H) 138(H)  BUN 7.0 - 26.0 mg/dL 9.0 11 10  Creatinine 0.6 - 1.1 mg/dL 0.8 0.69 0.81  Sodium 136 - 145 mEq/L 140 138 136  Potassium 3.5 - 5.1 mEq/L  3.7 3.9 3.9  Chloride 101 - 111 mmol/L - 103 102  CO2 22 -  29 mEq/L 27 25 27   Calcium 8.4 - 10.4 mg/dL 9.2 9.1 8.7(L)  Total Protein 6.4 - 8.3 g/dL 7.0 7.2 6.9  Total Bilirubin 0.20 - 1.20 mg/dL 0.75 0.5 0.6  Alkaline Phos 40 - 150 U/L 77 71 73  AST 5 - 34 U/L 19 21 19   ALT 0 - 55 U/L 15 14 14         RADIOGRAPHIC STUDIES: I have personally reviewed the radiological images as listed and agreed with the findings in the report. Ct Abdomen Pelvis W Contrast  05/23/2015  CLINICAL DATA:  Nausea, intractable vomiting. EXAM: CT ABDOMEN AND PELVIS WITH CONTRAST TECHNIQUE: Multidetector CT imaging of the abdomen and pelvis was performed using the standard protocol following bolus administration of intravenous contrast. CONTRAST:  164m OMNIPAQUE IOHEXOL 300 MG/ML  SOLN COMPARISON:  MRI 09/15/2014 FINDINGS: Linear subsegmental atelectasis in the lung bases. Heart is mildly enlarged. No effusions. Changes of partial left hepatectomy. No focal abnormality in the remaining liver. Spleen, pancreas, adrenals and kidneys are unremarkable. Prior cholecystectomy. There is wall thickening within the gastric antrum and pylorus, possibly related to gastritis. No evidence of gastric outlet obstruction. Small bowel and large bowel are decompressed and unremarkable. No free fluid, free air or adenopathy. Aorta is normal caliber. Prior hysterectomy. No adnexal masses. Urinary bladder is unremarkable. Appendix is visualized and is normal. Diffuse degenerative changes throughout the lumbar spine. No acute bony abnormality. IMPRESSION: Partial left hepatectomy. Circumferential wall thickening in the gastric antrum and pylorus regions. This could reflect gastritis. Prior hysterectomy and cholecystectomy. Electronically Signed   By: KRolm BaptiseM.D.   On: 05/23/2015 14:58     ASSESSMENT & PLAN:   70yo with multiple medical co-morbids  1) Intrahepatic invasive cholangiocarcinoma T2a N1 M0 (Stage IVA) with focal positive  parenchymal margins and 1/2 LN positive. CT C/A/P on 04/03/2016 showed no evidence of cholangiocarcinoma local recurrence or metastases at this time. Patient  has completed her adjuvant concurrent chemo-radiation  on 05/25/2015 . She did overall quite well with treatment without any overt prohibitive toxicities. Appears to be having some radiation gastritis causing grade 2 nausea vomiting .no hematemesis. No overt GI bleeding. Hemoglobin is stable.   2) grade 2 fatigue -Improving though currently notes that she feels somewhat weak likely due to dehydration .  3) Mild thrombocytopenia PLT 110K likely due to chemotherapy.  4) intractable nausea and some vomiting due to radiation gastritis. Patient was seen in the emergency room on 05/23/2015 and had a CT abdomen for this which showed circumferential wall thickening in the gastric antrum and pylorus regions likely from radiation gastritis.   5) dehydration Plan  -Patient has successfully completed her concurrent adjuvant chemoradiation for cholangiocarcinoma . -[facility] . -Appreciated all the input from radiation oncology Bonnie. MLisbeth Renshaw. -prn zofran for nausea. -- patient has had persistent intractable nausea and some vomiting likely due to radiation gastritis he did she has been using Zofran around-the-clock as well as Reglan that she was given in the emergency room and her nausea still persists and she's had poor oral intake. - we'll give her IV Emend plus dexamethasone , IV Protonix and IV fluids today to help control her symptoms . Her symptoms are better and she was able to eat some food while at the clinic. - she will be discharged home if she feels better on oral PPI twice a day with discontinuation of her famotidine , oral sucralfate liquid,  dexamethasone 4 mg by mouth twice a day couple weeks, scopolamine patch . -she was recommended to call us immediately if she had persistent symptoms and was  unable to maintain oral intake.   5)  oropharyngeal thrush  -Given oral nystatin swish and swallow for 2 weeks  6)  Patient Active Problem List   Diagnosis Date Noted  . 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 Nausea with vomiting 05/31/2015  . Dehydration 05/31/2015  . Thrush 05/31/2015  . Hypercalcemia 05/21/2015  . Nausea without vomiting 05/21/2015  . Bacterial folliculitis 67/04/1001  . Intrahepatic cholangiocarcinoma ([place]) 03/27/2015  . Inadequate sleep hygiene 08/22/2014  . 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 IBS (irritable bowel syndrome) 03/22/2013  . Osteoporosis, unspecified 10/15/2012  . Vitamin D deficiency 12/23/2011  . Tobacco abuse 10/12/2010  . COPD (chronic obstructive pulmonary disease) ([place]) 06/14/2010  . Obstructive sleep apnea 04/27/2009  . OBESITY, UNSPECIFIED 04/26/2009  . DYSLIPIDEMIA 11/25/2007  . GOUT NOS 12/24/2006  . Diabetes mellitus, type 2 ([place]) 06/05/2006  . DEPRESSION, MAJOR, RECURRENT 06/05/2006  . ANXIETY 06/05/2006  . HYPERTENSION, BENIGN SYSTEMIC 06/05/2006  . IRRITABLE BOWEL SYNDROME 06/05/2006  . OSTEOARTHRITIS, MULTI SITES 06/05/2006   Plan -continue f/u with PCP for the ongoing management of other medical issues  RTC with Bonnie Peterson in 2 weeks with cbc, cmp earlier if any new acute concerns or if she has persistent nausea vomiting that requires further attention .  I spent 25 minutes counseling the patient face to face. The total time spent in the appointment was 30 minutes and more than 50% was on counseling and direct patient cares.    Sullivan Lone MD [place] AAHIVMS [facility] Hematology/Oncology Physician [facility]  (Office):       [phone] (Work cell):  [phone] (Fax):           [phone]

## 2015-06-02 ENCOUNTER — Telehealth: Payer: Self-pay | Admitting: Hematology

## 2015-06-02 LAB — MULTIPLE MYELOMA PANEL, SERUM
Albumin SerPl Elph-Mcnc: 3.3 g/dL (ref 2.9–4.4)
Albumin/Glob SerPl: 1.1 (ref 0.7–1.7)
Alpha 1: 0.2 g/dL (ref 0.0–0.4)
Alpha2 Glob SerPl Elph-Mcnc: 0.6 g/dL (ref 0.4–1.0)
B-Globulin SerPl Elph-Mcnc: 1.3 g/dL (ref 0.7–1.3)
Gamma Glob SerPl Elph-Mcnc: 0.9 g/dL (ref 0.4–1.8)
Globulin, Total: 3.1 g/dL (ref 2.2–3.9)
IgA, Qn, Serum: 474 mg/dL — ABNORMAL HIGH (ref 87–352)
IgG, Qn, Serum: 959 mg/dL (ref 700–1600)
IgM, Qn, Serum: 27 mg/dL (ref 26–217)
Total Protein: 6.4 g/dL (ref 6.0–8.5)

## 2015-06-02 NOTE — Telephone Encounter (Signed)
Lvm advising appt 3/8 @ 1.15pm.

## 2015-06-06 ENCOUNTER — Telehealth: Payer: Self-pay | Admitting: *Deleted

## 2015-06-06 NOTE — Telephone Encounter (Signed)
FYI "I'm calling to let you all know how I feel this morning and I need to know if I am to continue my medicines.  I ate dinner at 5:00 pm yesterday.  In bed at 9:00.  I feel something coming up in my track when I lie down.  This morning I feel nauseated but have not vomited.  I placed the nausea patch behind my ear.  Do I need to continue my medicines with the patch behind my ear?  I take dexamethasone, carafate, omeprazole."    Advised to continue medicines as ordered.  Reports she will call if she feels worse.

## 2015-06-14 ENCOUNTER — Ambulatory Visit (HOSPITAL_BASED_OUTPATIENT_CLINIC_OR_DEPARTMENT_OTHER): Payer: Medicare HMO | Admitting: Hematology

## 2015-06-14 ENCOUNTER — Encounter (HOSPITAL_COMMUNITY): Payer: Self-pay

## 2015-06-14 ENCOUNTER — Ambulatory Visit (HOSPITAL_BASED_OUTPATIENT_CLINIC_OR_DEPARTMENT_OTHER): Payer: Medicare HMO

## 2015-06-14 ENCOUNTER — Encounter: Payer: Self-pay | Admitting: Family Medicine

## 2015-06-14 ENCOUNTER — Inpatient Hospital Stay (HOSPITAL_COMMUNITY): Payer: Medicare HMO

## 2015-06-14 ENCOUNTER — Inpatient Hospital Stay (HOSPITAL_COMMUNITY)
Admission: AD | Admit: 2015-06-14 | Discharge: 2015-06-16 | DRG: 392 | Disposition: A | Discharge status: Home / Self Care | Payer: Medicare HMO | Source: Ambulatory Visit | Attending: Internal Medicine | Admitting: Internal Medicine

## 2015-06-14 ENCOUNTER — Encounter: Payer: Self-pay | Admitting: Hematology

## 2015-06-14 ENCOUNTER — Ambulatory Visit (INDEPENDENT_AMBULATORY_CARE_PROVIDER_SITE_OTHER): Payer: Medicare HMO | Admitting: Family Medicine

## 2015-06-14 VITALS — BP 114/55 | HR 74 | Temp 98.3°F | Ht 63.0 in | Wt 189.3 lb

## 2015-06-14 DIAGNOSIS — Z6833 Body mass index (BMI) 33.0-33.9, adult: Secondary | ICD-10-CM | POA: Diagnosis not present

## 2015-06-14 DIAGNOSIS — Z7951 Long term (current) use of inhaled steroids: Secondary | ICD-10-CM | POA: Diagnosis not present

## 2015-06-14 DIAGNOSIS — Z9049 Acquired absence of other specified parts of digestive tract: Secondary | ICD-10-CM | POA: Diagnosis not present

## 2015-06-14 DIAGNOSIS — E860 Dehydration: Secondary | ICD-10-CM | POA: Diagnosis present

## 2015-06-14 DIAGNOSIS — T451X5A Adverse effect of antineoplastic and immunosuppressive drugs, initial encounter: Secondary | ICD-10-CM

## 2015-06-14 DIAGNOSIS — Z7984 Long term (current) use of oral hypoglycemic drugs: Secondary | ICD-10-CM | POA: Diagnosis not present

## 2015-06-14 DIAGNOSIS — Z79899 Other long term (current) drug therapy: Secondary | ICD-10-CM | POA: Diagnosis not present

## 2015-06-14 DIAGNOSIS — Z923 Personal history of irradiation: Secondary | ICD-10-CM

## 2015-06-14 DIAGNOSIS — E785 Hyperlipidemia, unspecified: Secondary | ICD-10-CM | POA: Diagnosis present

## 2015-06-14 DIAGNOSIS — E1169 Type 2 diabetes mellitus with other specified complication: Secondary | ICD-10-CM

## 2015-06-14 DIAGNOSIS — I10 Essential (primary) hypertension: Secondary | ICD-10-CM | POA: Diagnosis present

## 2015-06-14 DIAGNOSIS — J449 Chronic obstructive pulmonary disease, unspecified: Secondary | ICD-10-CM | POA: Diagnosis present

## 2015-06-14 DIAGNOSIS — Z23 Encounter for immunization: Secondary | ICD-10-CM

## 2015-06-14 DIAGNOSIS — K2900 Acute gastritis without bleeding: Principal | ICD-10-CM | POA: Diagnosis present

## 2015-06-14 DIAGNOSIS — Z800 Family history of malignant neoplasm of digestive organs: Secondary | ICD-10-CM | POA: Diagnosis not present

## 2015-06-14 DIAGNOSIS — R1110 Vomiting, unspecified: Secondary | ICD-10-CM | POA: Diagnosis not present

## 2015-06-14 DIAGNOSIS — C221 Intrahepatic bile duct carcinoma: Secondary | ICD-10-CM | POA: Diagnosis present

## 2015-06-14 DIAGNOSIS — E669 Obesity, unspecified: Secondary | ICD-10-CM

## 2015-06-14 DIAGNOSIS — Z9221 Personal history of antineoplastic chemotherapy: Secondary | ICD-10-CM

## 2015-06-14 DIAGNOSIS — Z833 Family history of diabetes mellitus: Secondary | ICD-10-CM

## 2015-06-14 DIAGNOSIS — G4733 Obstructive sleep apnea (adult) (pediatric): Secondary | ICD-10-CM | POA: Diagnosis present

## 2015-06-14 DIAGNOSIS — F17210 Nicotine dependence, cigarettes, uncomplicated: Secondary | ICD-10-CM | POA: Diagnosis present

## 2015-06-14 DIAGNOSIS — Z8249 Family history of ischemic heart disease and other diseases of the circulatory system: Secondary | ICD-10-CM | POA: Diagnosis not present

## 2015-06-14 DIAGNOSIS — K311 Adult hypertrophic pyloric stenosis: Secondary | ICD-10-CM | POA: Diagnosis present

## 2015-06-14 DIAGNOSIS — M1990 Unspecified osteoarthritis, unspecified site: Secondary | ICD-10-CM | POA: Diagnosis present

## 2015-06-14 DIAGNOSIS — R112 Nausea with vomiting, unspecified: Secondary | ICD-10-CM | POA: Diagnosis present

## 2015-06-14 DIAGNOSIS — F419 Anxiety disorder, unspecified: Secondary | ICD-10-CM | POA: Diagnosis present

## 2015-06-14 DIAGNOSIS — R933 Abnormal findings on diagnostic imaging of other parts of digestive tract: Secondary | ICD-10-CM | POA: Diagnosis not present

## 2015-06-14 DIAGNOSIS — Z0000 Encounter for general adult medical examination without abnormal findings: Secondary | ICD-10-CM | POA: Diagnosis not present

## 2015-06-14 DIAGNOSIS — K2960 Other gastritis without bleeding: Secondary | ICD-10-CM

## 2015-06-14 DIAGNOSIS — D6959 Other secondary thrombocytopenia: Secondary | ICD-10-CM | POA: Diagnosis present

## 2015-06-14 DIAGNOSIS — B370 Candidal stomatitis: Secondary | ICD-10-CM | POA: Diagnosis present

## 2015-06-14 DIAGNOSIS — Z7952 Long term (current) use of systemic steroids: Secondary | ICD-10-CM

## 2015-06-14 DIAGNOSIS — D638 Anemia in other chronic diseases classified elsewhere: Secondary | ICD-10-CM | POA: Diagnosis present

## 2015-06-14 DIAGNOSIS — Z803 Family history of malignant neoplasm of breast: Secondary | ICD-10-CM | POA: Diagnosis not present

## 2015-06-14 DIAGNOSIS — C779 Secondary and unspecified malignant neoplasm of lymph node, unspecified: Secondary | ICD-10-CM | POA: Diagnosis not present

## 2015-06-14 DIAGNOSIS — E119 Type 2 diabetes mellitus without complications: Secondary | ICD-10-CM

## 2015-06-14 DIAGNOSIS — Z90710 Acquired absence of both cervix and uterus: Secondary | ICD-10-CM

## 2015-06-14 DIAGNOSIS — F339 Major depressive disorder, recurrent, unspecified: Secondary | ICD-10-CM | POA: Diagnosis present

## 2015-06-14 DIAGNOSIS — K2980 Duodenitis without bleeding: Secondary | ICD-10-CM | POA: Diagnosis present

## 2015-06-14 LAB — COMPREHENSIVE METABOLIC PANEL
ALT: 13 U/L (ref 0–55)
AST: 18 U/L (ref 5–34)
Albumin: 2.7 g/dL — ABNORMAL LOW (ref 3.5–5.0)
Alkaline Phosphatase: 76 U/L (ref 40–150)
Anion Gap: 9 mEq/L (ref 3–11)
BUN: 6 mg/dL — ABNORMAL LOW (ref 7.0–26.0)
CO2: 26 mEq/L (ref 22–29)
Calcium: 8.3 mg/dL — ABNORMAL LOW (ref 8.4–10.4)
Chloride: 103 mEq/L (ref 98–109)
Creatinine: 0.7 mg/dL (ref 0.6–1.1)
EGFR: >90 mL/min/{1.73_m2} (ref >90)
Glucose: 153 mg/dl — ABNORMAL HIGH (ref 70–140)
Potassium: 3.6 mEq/L (ref 3.5–5.1)
Sodium: 138 mEq/L (ref 136–145)
Total Bilirubin: 0.59 mg/dL (ref 0.20–1.20)
Total Protein: 5.8 g/dL — ABNORMAL LOW (ref 6.4–8.3)

## 2015-06-14 LAB — CBC & DIFF AND RETIC
BASO%: 0.6 % (ref 0.0–2.0)
Basophils Absolute: 0.1 10*3/uL (ref 0.0–0.1)
EOS%: 0.3 % (ref 0.0–7.0)
Eosinophils Absolute: 0 10*3/uL (ref 0.0–0.5)
HCT: 35.5 % (ref 34.8–46.6)
HGB: 11.5 g/dL — ABNORMAL LOW (ref 11.6–15.9)
Immature Retic Fract: 5.4 % (ref 1.60–10.00)
LYMPH%: 48.5 % (ref 14.0–49.7)
MCH: 29.1 pg (ref 25.1–34.0)
MCHC: 32.4 g/dL (ref 31.5–36.0)
MCV: 89.9 fL (ref 79.5–101.0)
MONO#: 0.7 10*3/uL (ref 0.1–0.9)
MONO%: 7.5 % (ref 0.0–14.0)
NEUT#: 3.9 10*3/uL (ref 1.5–6.5)
NEUT%: 43.1 % (ref 38.4–76.8)
Platelets: 92 10*3/uL — ABNORMAL LOW (ref 145–400)
RBC: 3.95 10*6/uL (ref 3.70–5.45)
RDW: 19.3 % — ABNORMAL HIGH (ref 11.2–14.5)
Retic %: 3.52 % — ABNORMAL HIGH (ref 0.70–2.10)
Retic Ct Abs: 139.04 10*3/uL — ABNORMAL HIGH (ref 33.70–90.70)
WBC: 9.1 10*3/uL (ref 3.9–10.3)
lymph#: 4.4 10*3/uL — ABNORMAL HIGH (ref 0.9–3.3)

## 2015-06-14 LAB — GLUCOSE, CAPILLARY
Glucose-Capillary: 99 mg/dL (ref 65–99)
Glucose-Capillary: 109 mg/dL — ABNORMAL HIGH (ref 65–99)

## 2015-06-14 LAB — POCT GLYCOSYLATED HEMOGLOBIN (HGB A1C): Hemoglobin A1C: 7.4

## 2015-06-14 MED ORDER — INSULIN ASPART 100 UNIT/ML ~~LOC~~ SOLN
0.0000 [IU] | Freq: Three times a day (TID) | SUBCUTANEOUS | Status: DC
Start: 2015-06-14 — End: 2015-06-16
  Administered 2015-06-15 – 2015-06-16 (×2): 2 [IU] via SUBCUTANEOUS
  Administered 2015-06-16: 1 [IU] via SUBCUTANEOUS

## 2015-06-14 MED ORDER — ALBUTEROL SULFATE (2.5 MG/3ML) 0.083% IN NEBU: 2.5000 mg | INHALATION_SOLUTION | RESPIRATORY_TRACT | Status: DC | PRN

## 2015-06-14 MED ORDER — MORPHINE SULFATE (PF) 2 MG/ML IV SOLN: 1.0000 mg | INTRAVENOUS | Status: DC | PRN

## 2015-06-14 MED ORDER — ACETAMINOPHEN 325 MG PO TABS: 650.0000 mg | ORAL_TABLET | Freq: Four times a day (QID) | ORAL | Status: DC | PRN

## 2015-06-14 MED ORDER — ONDANSETRON HCL 4 MG/2ML IJ SOLN
4.0000 mg | Freq: Four times a day (QID) | INTRAMUSCULAR | Status: DC | PRN
  Administered 2015-06-14: 4 mg via INTRAVENOUS
  Filled 2015-06-14: qty 2

## 2015-06-14 MED ORDER — ONDANSETRON 8 MG PO TBDP: 8.0000 mg | ORAL_TABLET | Freq: Three times a day (TID) | ORAL | Status: DC | PRN

## 2015-06-14 MED ORDER — PROMETHAZINE HCL 25 MG/ML IJ SOLN: 12.5000 mg | Freq: Four times a day (QID) | INTRAMUSCULAR | Status: DC | PRN

## 2015-06-14 MED ORDER — PANTOPRAZOLE SODIUM 40 MG IV SOLR: 40.0000 mg | INTRAVENOUS | Status: DC

## 2015-06-14 MED ORDER — ONDANSETRON HCL 4 MG PO TABS: 4.0000 mg | ORAL_TABLET | Freq: Four times a day (QID) | ORAL | Status: DC | PRN

## 2015-06-14 MED ORDER — DEXAMETHASONE SODIUM PHOSPHATE 4 MG/ML IJ SOLN
4.0000 mg | INTRAMUSCULAR | Status: DC
  Administered 2015-06-15: 4 mg via INTRAVENOUS
  Filled 2015-06-14: qty 1

## 2015-06-14 MED ORDER — ACETAMINOPHEN 650 MG RE SUPP: 650.0000 mg | Freq: Four times a day (QID) | RECTAL | Status: DC | PRN

## 2015-06-14 MED ORDER — IOHEXOL 300 MG/ML  SOLN
100.0000 mL | Freq: Once | INTRAMUSCULAR | Status: AC | PRN
Start: 2015-06-14 — End: 2015-06-14
  Administered 2015-06-14: 100 mL via INTRAVENOUS

## 2015-06-14 MED ORDER — POTASSIUM CHLORIDE IN NACL 20-0.9 MEQ/L-% IV SOLN
INTRAVENOUS | Status: DC
  Administered 2015-06-14 – 2015-06-15 (×2): via INTRAVENOUS
  Filled 2015-06-14 (×2): qty 1000

## 2015-06-14 MED ORDER — ENOXAPARIN SODIUM 40 MG/0.4ML ~~LOC~~ SOLN
40.0000 mg | SUBCUTANEOUS | Status: DC
  Administered 2015-06-14 – 2015-06-15 (×2): 40 mg via SUBCUTANEOUS
  Filled 2015-06-14 (×2): qty 0.4

## 2015-06-14 MED ORDER — TETANUS-DIPHTH-ACELL PERTUSSIS 5-2.5-18.5 LF-MCG/0.5 IM SUSP: 0.5000 mL | Freq: Once | INTRAMUSCULAR | Status: DC

## 2015-06-14 MED ORDER — SCOPOLAMINE 1 MG/3DAYS TD PT72
1.0000 | MEDICATED_PATCH | TRANSDERMAL | Status: DC
  Administered 2015-06-16: 1.5 mg via TRANSDERMAL
  Filled 2015-06-14: qty 1

## 2015-06-14 MED FILL — ONDANSETRON ODT 8 MG TABLET: 8 | 30 days supply | Qty: 90 | Fill #0

## 2015-06-14 NOTE — Progress Notes (Signed)
Patient ID: Bonnie Peterson, female   DOB: [birth year], 70 y.o.   MRN: [id number]   [facility] Aquilla Hacker, MD Phone: [phone]  Subjective:   # DMII - pt. Here for follow up .  - Has been unable to take her medicine due to nausea and vomiting related to chemotherapy that she has been undergoing.  - Has not been checking her glucose - Has not had an ophthalmology visit yet this year.  - No distal numbness, or tingling.  - No vision changes.  - No polyuria or polydypsia.  - On Metformin, Glipizide.   # Intrahepatic Cholangiocarcinoma  - Continues to follow with Heme Onc - appreciate their care of Ms. [name] - Ongoing Radiation / Chemotherapy.  - Has been unable to keep anything down.  - Per chart review Onc has been working on management of her nausea / vomiting. 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 Treated with steroids, PPI, Sucralfate.  - Pt. Says she was not aware that she was supposed to be taking Zofran to help with this. She says she has not been taking some of her pills due to inability to keep anything down.  - She says if her nausea is improved she'll be able to continue taking her other medicines as well.  - Otherwise, she is in good spirits regarding her treatment. She feels that she is receiving good care.  - No rashes, no vision loss, hearing loss, hematuria, abdominal pain, or other positive ROS.   # Healthcare Maintenance  - Mammogram next year.  - TDAP today - Pneumonia Vaccine today - C-scope done in 2015 - Ophtho exam needed.   All relevant systems were reviewed and were negative unless otherwise noted in the HPI  Past Medical History Reviewed problem list.  Medications- reviewed and updated Current Outpatient Prescriptions  Medication Sig Dispense Refill  . ACCU-CHEK AVIVA PLUS test strip 1 each by Other route daily.     [name] albuterol (PROAIR HFA) 108 (90 BASE) MCG/ACT inhaler INHALE 2 PUFFS INTO THE LUNGS EVERY 4 HOURS AS NEEDED FOR  WHEEZING OR SHORTNESS OF BREATH 1 Inhaler 0  . alendronate (FOSAMAX) 70 MG tablet TAKE 1 TABLET  ONCE A WEEK. (Patient taking differently: TAKE 1 TABLET  ONCE A WEEK.--Sunday) 12 tablet 2  . amLODipine (NORVASC) 10 MG tablet TAKE 1 TABLET (10 MG TOTAL) BY MOUTH DAILY WITH BREAKFAST. 90 tablet 3  . aspirin 81 MG chewable tablet Chew 81 mg by mouth daily. Reported on 04/28/2015    . atenolol (TENORMIN) 50 MG tablet Take 1 tablet (50 mg total) by mouth daily. 90 tablet 3  . baclofen (LIORESAL) 10 MG tablet Take 1 tablet (10 mg total) by mouth 3 (three) times daily as needed for muscle spasms. 90 each 11  . capecitabine (XELODA) 500 MG tablet Take 3 tablets (1,500 mg total) by mouth 2 (two) times daily after a meal. Monday through Friday while getting radiation therapy. (Patient not taking: Reported on 05/26/2015) 48 tablet 0  . cholecalciferol (VITAMIN D) 1000 UNITS tablet Take 1 tablet (1,000 Units total) by mouth daily. 30 tablet 11  . colchicine 0.6 MG tablet TAKE 1 TABLET DAILY AS NEEDED FOR GOUT FLARE 30 tablet 1  . dexamethasone (DECADRON) 4 MG tablet Take 1 tablet (4 mg total) by mouth 2 (two) times daily with breakfast and lunch. 30 tablet 0  . diclofenac sodium (VOLTAREN) 1 % GEL Apply 2 g topically 4 (four) times daily as needed. (  Patient taking differently: Apply 2 g topically 4 (four) times daily as needed (knee pain, elbow and hands). ) 100 g 4  . enalapril (VASOTEC) 20 MG tablet Take 1 tablet (20 mg total) by mouth daily with breakfast. 90 tablet 3  . famotidine (PEPCID) 20 MG tablet Take 1 tablet (20 mg total) by mouth 2 (two) times daily. 30 tablet 0  . fish oil-omega-3 fatty acids 1000 MG capsule Take 1 g by mouth every 30 (thirty) days.     . fluticasone (FLONASE) 50 MCG/ACT nasal spray Place 1 spray into the nose daily as needed for rhinitis.    . hyaluronate sodium (RADIAPLEXRX) GEL Apply 1 application topically 2 (two) times daily.    . Hyprom-Naphaz-Polysorb-Zn Sulf (CLEAR EYES  COMPLETE OP) Place 1 drop into both eyes daily as needed (dry eyes).     [name] loratadine (CLARITIN) 10 MG tablet Take 1 tablet (10 mg total) by mouth daily as needed for allergies. 30 tablet 3  . metFORMIN (GLUCOPHAGE) 1000 MG tablet Take 1 tablet (1,000 mg total) by mouth 2 (two) times daily with a meal. Take 500mg  each morning, 1000mg  each night 180 tablet 3  . metoCLOPramide (REGLAN) 10 MG tablet Take 1 tablet (10 mg total) by mouth every 8 (eight) hours as needed for nausea or vomiting. 20 tablet 0  . Multiple Vitamin (MULTIVITAMIN WITH MINERALS) TABS tablet Take 1 tablet by mouth daily.    [name] nystatin (MYCOSTATIN) 100000 UNIT/ML suspension Take 5 mLs (500,000 Units total) by mouth 4 (four) times daily. 280 mL 0  . omeprazole (PRILOSEC) 40 MG capsule Take 1 capsule (40 mg total) by mouth 2 (two) times daily before a meal. 60 capsule 1  . ondansetron (ZOFRAN ODT) 8 MG disintegrating tablet Take 1 tablet (8 mg total) by mouth every 8 (eight) hours as needed for nausea or vomiting. 90 tablet 3  . ondansetron (ZOFRAN) 8 MG tablet Take 1 tablet (8 mg total) by mouth every 8 (eight) hours as needed for nausea or vomiting. 30 tablet 1  . scopolamine (TRANSDERM-SCOP) 1 MG/3DAYS Place 1 patch (1.5 mg total) onto the skin every 3 (three) days. 5 patch 0  . sucralfate (CARAFATE) 1 GM/10ML suspension Take 10 mLs (1 g total) by mouth 4 (four) times daily -  with meals and at bedtime. 420 mL 0  . Tdap (BOOSTRIX) 5-2.5-18.5 LF-MCG/0.5 injection Inject 0.5 mLs into the muscle once. 0.5 mL 0  . tiotropium (SPIRIVA) 18 MCG inhalation capsule Place 1 capsule (18 mcg total) into inhaler and inhale as needed (if its hot outside). 30 capsule 5  . traMADol (ULTRAM) 50 MG tablet Take 1 tablet (50 mg total) by mouth every 6 (six) hours as needed for moderate pain or severe pain. 30 tablet 0  . VITAMIN E PO Take 1 tablet by mouth daily.      No current facility-administered medications for this visit.   Chief  complaint-noted No additions to family history Social history- patient is a former smoker > has quit since her diagnosis.   Objective: BP 114/55 mmHg  Pulse 74  Temp(Src) 98.3 F (36.8 C) (Oral)  Ht 5\' 3"  (1.6 m)  Wt 189 lb 4.8 oz (85.866 kg)  BMI 33.54 kg/m2 Gen: NAD, alert, cooperative with exam HEENT: NCAT, EOMI, PERRL Neck: FROM, supple CV: RRR, good S1/S2, no murmur Resp: CTABL, no wheezes, non-labored Abd: S, Mild TTP epigastric, BS present, no guarding or organomegaly Ext: No edema, warm, normal tone, moves UE/LE spontaneously  Neuro: Alert and oriented, No gross deficits Skin: no rashes no lesions  Assessment/Plan: See problem based a/p  # Healthcare Maintenance - only needs TDAP, PPSV23 today.  - mammogram next year - Otherwise up to date.   # Chemo Related N/V - pt. With ongoing N/V. Onc has been trying to treat, and it appears she was actually treated with IVF for dehydration at one point recently. She says that she was not aware she was supposed to be taking Zofran. Dr. Irene Limbo had sent in Rx for this already. - Sent in new Rx for Zofran 8mg  ODT so that she doesn't have to take pill.  - Hopefully this will help some.  - May need to switch to alternative especially if she is unable to keep anything down.  - Appreciate Onc help in managing this.  - Has been on Decadron, PPI, and was Rx'd Zofran already.  - Has F/U appt with Onc today. Pt. Instructed to call back / return if she continues to have issues with N/V.   Diabetes mellitus, type 2 Pt. With A1C of 7.4 despite not taking her medicine with her nausea. Suspect this is because she hasn't been able to eat much in general. Will continue her medications the way they are for now. No changes. Need to treat her nausea so that she can take her medicine.  - Continue Metformin, Glipizide.  - Ophtho exam for retinopathy.  - Foot screening done today.  - Follow up in 6 months.

## 2015-06-14 NOTE — Progress Notes (Deleted)
Patient ID: Bonnie Peterson, female   DOB: 08-Jan-1946, 70 y.o.   MRN: [id number]  Direct admission for intractable N/V, recently in ED for the same, had CT done at that time and was notable for ? Pyloric inflammation and now her symptoms are getting worse and she is unable to take anything PO. Seen Dr. Irene Limbo, concern for GOO from pyloric inflammation, unable to take anything PO. Will need repeat abd CT scan, may need GI consult. Medical bed requested.   Faye Ramsay, MD  [facility] Pager [phone]  If 7PM-7AM, please contact night-coverage [email or web address] Password [email or web address]

## 2015-06-14 NOTE — Assessment & Plan Note (Signed)
Pt. With A1C of 7.4 despite not taking her medicine with her nausea. Suspect this is because she hasn't been able to eat much in general. Will continue her medications the way they are for now. No changes. Need to treat her nausea so that she can take her medicine.  - Continue Metformin, Glipizide.  - Ophtho exam for retinopathy.  - Foot screening done today.  - Follow up in 6 months.

## 2015-06-14 NOTE — Patient Instructions (Signed)
Thanks for coming in today.   I'm glad that you have such a positive spirit.   We'll send in some dissolving tablets for nausea for you. Take these before meals to help you keep things down.   If this does not work, let me know so that we can get you something else. [  Your diabetes is controlled well enough for now.   Continue you current regimen.   Get your eyes checked by an ophthalmologist for retinopathy.   You will get a mammogram next year for screening. Otherwise, we will give you your pneumonia vaccine, and TDAP today.   Thanks for letting us take care of you.   Sincerely, Paula Compton, MD

## 2015-06-14 NOTE — Progress Notes (Signed)
[name]    HEMATOLOGY/ONCOLOGY CLINIC NOTE  Date of Service: 06/14/2015   Patient Care Team: Aquilla Hacker, MD as PCP - General  CHIEF COMPLAINTS/PURPOSE OF CONSULTATION:   F/u for Cholangiocarcinoma s/p completion of concurrent chemo-Radiation.  DIAGNOSIS  Stage IVA (T2a, pN1, M0) Intrahepatic Cholangiocarcinoma  Current Treatment  -Concurrent Capecitabine 842m/m2 po BID M-F while on concurrent RT Final date of RT planned for 05/25/2015  Previous treatment 02/09/15 L lobe of liver partial hepatectomy segments 2, 3, 4a, 4b invasive cholangiocarcinoma, moderately differentiated. Tumor focally extends to the parenchymal margin of resection. Uninvolved liver parenchyma with steatohepatitis and associated periportal and centrilobular pericellular fibrosis. Scattered von Meyenburg complexes. One hepatic artery LN with metastatic cholangiocarcinoma. Fibroadipose tissue and nerve negative for malignancy. Gallbladder negative for malignancy. One LN negative for malignancy. T2aN1 invasive cholangiocarcinoma, moderately differentiated, with positive hepatic parenchymal margin, and 1/2 nodes positive for tumor involvement.    Interval History  Ms MCrichlowis here for her scheduled follow-up. She completed her concurrent chemoradiation on 05/25/2015. She had visited the emergency room on 05/23/2015 for persistent nausea. She had a CT scan of the abdomen that was concerning for radiation gastritis with antral and some pyloric inflammation as well . She was seen in clinic on 05/31/2015 with persistent nausea. Her nausea medications were optimized. She was given IV fluids and her symptoms were improved and she was discharged home. She notes that she has been using her nausea medication but has still had persistent nausea and daily vomiting. Symptoms of been worse in the last 5-6 days with some abdominal pain. She did not [place] despite instructions to do so. She mentions is in clinic  today and appears quite uncomfortable with persistent nausea and vomiting. She notes that she feels like the food does not advance beyond the stomach. Has been moving her bowels. No hematemesis. Concern for gastric outlet obstruction due to pyloric stenosis. She is agreeable to be admitted for symptom control and further workup.  MEDICAL HISTORY:  Past Medical History  Diagnosis Date  . Obstructive sleep apnea   . HTN, goal below 130/80   . HLD (hyperlipidemia)   . Benign positional vertigo   . Depression   . Anxiety   . Irritable bowel syndrome   . Obesity, Class III, BMI 40-49.9 (morbid obesity) ([place]   . Gout   . Restrictive lung disease   . Echocardiogram findings abnormal, without diagnosis 10/10    10/10: mild pulm HTN, EF 60-65%, mild LVH, moderate aortic regurg  . Early cataracts, bilateral 10/13    Optho, Dr SGershon Crane . COPD (chronic obstructive pulmonary disease) [place]   . Osteoarthritis (arthritis due to wear and tear of joints)     also gout  . Cancer ([place] 02/[place]15    intrahepatic cholangiocarcinoma   SURGICAL HISTORY: Past Surgical History  Procedure Laterality Date  . Partial hysterectomy    . Rotator cuff repair        L rotator cuff repair 11/07-[facility] - 12/7/200  . Tonsillectomy    . Liver biopsy    . Abdominal hysterectomy    . Open partial hepatectomy [83] Left 02/09/15    SOCIAL HISTORY: Social History   Social History  . Marital Status: Single    Spouse Name: N/A  . Number of Children: 4  . Years of Education: N/A   Occupational History  . unemployed    Social History Main Topics  . Smoking status: Current Some Day  Smoker -- 0.50 packs/day for 40 years    Types: Cigarettes  . Smokeless tobacco: Never Used  . Alcohol Use: 0.0 oz/week    0 Standard drinks or equivalent per week     Comment: occasionally  . Drug Use: No  . Sexual Activity: Not on file   Other Topics Concern  . 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 Breast cancer Mother   . Hypertension Mother   . Coronary artery disease Mother   . Diabetes type II Sister   . Pancreatic cancer Sister   . Diabetes type II Mother     ALLERGIES:  is allergic to other.  MEDICATIONS:  Current Outpatient Prescriptions  Medication Sig Dispense Refill  . ACCU-CHEK AVIVA PLUS test strip 1 each by Other route daily.     [name] albuterol (PROAIR HFA) 108 (90 BASE) MCG/ACT inhaler INHALE 2 PUFFS INTO THE LUNGS EVERY 4 HOURS AS NEEDED FOR WHEEZING OR SHORTNESS OF BREATH 1 Inhaler 0  . alendronate (FOSAMAX) 70 MG tablet TAKE 1 TABLET  ONCE A WEEK. (Patient taking differently: TAKE 1 TABLET  ONCE A WEEK.--Sunday) 12 tablet 2  . amLODipine (NORVASC) 10 MG tablet TAKE 1 TABLET (10 MG TOTAL) BY MOUTH DAILY WITH BREAKFAST. 90 tablet 3  . aspirin 81 MG chewable tablet Chew 81 mg by mouth daily. Reported on 04/28/2015    . atenolol (TENORMIN) 50 MG tablet Take 1 tablet (50 mg total) by mouth daily. 90 tablet 3  . baclofen (LIORESAL) 10 MG tablet Take 1 tablet (10 mg total) by mouth 3 (three) times daily as needed for muscle spasms. 90 each 11  . capecitabine (XELODA) 500 MG tablet Take 3 tablets (1,500 mg total) by mouth 2 (two) times daily after a meal. Monday through Friday while getting radiation therapy. (Patient not taking: Reported on 05/26/2015) 48 tablet 0  . cholecalciferol (VITAMIN D) 1000 UNITS tablet Take 1 tablet (1,000 Units total) by mouth daily. 30 tablet 11  . colchicine 0.6 MG tablet TAKE 1 TABLET DAILY AS NEEDED FOR GOUT FLARE 30 tablet 1  . dexamethasone (DECADRON) 4 MG tablet Take 1 tablet (4 mg total) by  mouth 2 (two) times daily with breakfast and lunch. 30 tablet 0  . diclofenac sodium (VOLTAREN) 1 % GEL Apply 2 g topically 4 (four) times daily as needed. (Patient taking differently: Apply 2 g topically 4 (four) times daily as needed (knee pain, elbow and hands). ) 100 g 4  . enalapril (VASOTEC) 20 MG tablet Take 1 tablet (20 mg total) by mouth daily with breakfast. 90 tablet 3  . famotidine (PEPCID) 20 MG tablet Take 1 tablet (20 mg total) by mouth 2 (two) times daily. 30 tablet 0  . fish oil-omega-3 fatty acids 1000 MG capsule Take 1 g by mouth every 30 (thirty) days.     . fluticasone (FLONASE) 50 MCG/ACT nasal spray Place 1 spray into the nose daily as needed for rhinitis.    . hyaluronate sodium (RADIAPLEXRX) GEL Apply 1 application topically 2 (two) times  daily.    . Hyprom-Naphaz-Polysorb-Zn Sulf (CLEAR EYES COMPLETE OP) Place 1 drop into both eyes daily as needed (dry eyes).     [name] loratadine (CLARITIN) 10 MG tablet Take 1 tablet (10 mg total) by mouth daily as needed for allergies. 30 tablet 3  . metFORMIN (GLUCOPHAGE) 1000 MG tablet Take 1 tablet (1,000 mg total) by mouth 2 (two) times daily with a meal. Take 516m each morning, 10029meach night 180 tablet 3  . metoCLOPramide (REGLAN) 10 MG tablet Take 1 tablet (10 mg total) by mouth every 8 (eight) hours as needed for nausea or vomiting. 20 tablet 0  . Multiple Vitamin (MULTIVITAMIN WITH MINERALS) TABS tablet Take 1 tablet by mouth daily.    . Marland Kitchenystatin (MYCOSTATIN) 100000 UNIT/ML suspension Take 5 mLs (500,000 Units total) by mouth 4 (four) times daily. 280 mL 0  . omeprazole (PRILOSEC) 40 MG capsule Take 1 capsule (40 mg total) by mouth 2 (two) times daily before a meal. 60 capsule 1  . ondansetron (ZOFRAN ODT) 8 MG disintegrating tablet Take 1 tablet (8 mg total) by mouth every 8 (eight) hours as needed for nausea or vomiting. 90 tablet 3  . ondansetron (ZOFRAN) 8 MG tablet Take 1 tablet (8 mg total) by mouth every 8 (eight) hours as  needed for nausea or vomiting. 30 tablet 1  . scopolamine (TRANSDERM-SCOP) 1 MG/3DAYS Place 1 patch (1.5 mg total) onto the skin every 3 (three) days. 5 patch 0  . sucralfate (CARAFATE) 1 GM/10ML suspension Take 10 mLs (1 g total) by mouth 4 (four) times daily -  with meals and at bedtime. 420 mL 0  . Tdap (BOOSTRIX) 5-2.5-18.5 LF-MCG/0.5 injection Inject 0.5 mLs into the muscle once. 0.5 mL 0  . tiotropium (SPIRIVA) 18 MCG inhalation capsule Place 1 capsule (18 mcg total) into inhaler and inhale as needed (if its hot outside). 30 capsule 5  . traMADol (ULTRAM) 50 MG tablet Take 1 tablet (50 mg total) by mouth every 6 (six) hours as needed for moderate pain or severe pain. 30 tablet 0  . VITAMIN E PO Take 1 tablet by mouth daily.      No current facility-administered medications for this visit.    REVIEW OF SYSTEMS:    10 Point review of Systems was done is negative except as noted above.  PHYSICAL EXAMINATION: ECOG PERFORMANCE STATUS: 1 - Symptomatic but completely ambulatory Vital signs reviewed in Epic Afebrile temperature 98.6, breathing at 18, pulse 66/m, blood pressure 155/58.  GENERAL:alert, in no acute distress and comfortable SKIN: skin color, texture, turgor are normal, no rashes or significant lesions EYES: normal, conjunctiva are pink and non-injected, sclera clear OROPHARYNX: Mucous membranes dry , significant oropharyngeal thrush noted. NECK: supple, no JVD, thyroid normal size, non-tender, without nodularity LYMPH:  no palpable lymphadenopathy in the cervical, axillary or inguinal LUNGS: clear to auscultation with normal respiratory effort HEART: regular rate & rhythm,  no murmurs and no lower extremity edema ABDOMEN: abdomen appears distended especially in the upper belly. Hypoactive bowel sounds. No guarding rigidity or rebound. Musculoskeletal: no cyanosis of digits and no clubbing  PSYCH: alert & oriented x 3 with fluent speech NEURO: no focal motor/sensory  deficits  LABORATORY DATA:  . CBC Latest Ref Rng 06/14/2015 05/31/2015 05/23/2015  WBC 3.9 - 10.3 10e3/uL 9.1 4.1 5.8  Hemoglobin 11.6 - 15.9 g/dL 11.5(L) 12.5 12.6  Hematocrit 34.8 - 46.6 % 35.5 38.2 39.2  Platelets 145 - 400 10e3/uL 92(L) 110(L) 137(L)   .  CMP Latest Ref Rng 06/14/2015 05/31/2015 05/31/2015  Glucose 70 - 140 mg/dl 153(H) 156(H) -  BUN 7.0 - 26.0 mg/dL 6.0(L) 9.0 -  Creatinine 0.6 - 1.1 mg/dL 0.7 0.8 -  Sodium 136 - 145 mEq/L 138 140 -  Potassium 3.5 - 5.1 mEq/L 3.6 3.7 -  Chloride 101 - 111 mmol/L - - -  CO2 22 - 29 mEq/L 26 27 -  Calcium 8.4 - 10.4 mg/dL 8.3(L) 9.2 -  Total Protein 6.4 - 8.3 g/dL 5.8(L) 7.0 6.4  Total Bilirubin 0.20 - 1.20 mg/dL 0.59 0.75 -  Alkaline Phos 40 - 150 U/L 76 77 -  AST 5 - 34 U/L 18 19 -  ALT 0 - 55 U/L 13 15 -   RADIOGRAPHIC STUDIES: I have personally reviewed the radiological images as listed and agreed with the findings in the report. Ct Abdomen Pelvis W Contrast  05/23/2015  CLINICAL DATA:  Nausea, intractable vomiting. EXAM: CT ABDOMEN AND PELVIS WITH CONTRAST TECHNIQUE: Multidetector CT imaging of the abdomen and pelvis was performed using the standard protocol following bolus administration of intravenous contrast. CONTRAST:  185m OMNIPAQUE IOHEXOL 300 MG/ML  SOLN COMPARISON:  MRI 09/15/2014 FINDINGS: Linear subsegmental atelectasis in the lung bases. Heart is mildly enlarged. No effusions. Changes of partial left hepatectomy. No focal abnormality in the remaining liver. Spleen, pancreas, adrenals and kidneys are unremarkable. Prior cholecystectomy. There is wall thickening within the gastric antrum and pylorus, possibly related to gastritis. No evidence of gastric outlet obstruction. Small bowel and large bowel are decompressed and unremarkable. No free fluid, free air or adenopathy. Aorta is normal caliber. Prior hysterectomy. No adnexal masses. Urinary bladder is unremarkable. Appendix is visualized and is normal. Diffuse degenerative  changes throughout the lumbar spine. No acute bony abnormality. IMPRESSION: Partial left hepatectomy. Circumferential wall thickening in the gastric antrum and pylorus regions. This could reflect gastritis. Prior hysterectomy and cholecystectomy. Electronically Signed   By: KRolm BaptiseM.D.   On: 05/23/2015 14:58     ASSESSMENT & PLAN:   70yo with multiple medical co-morbids  1) Intrahepatic invasive cholangiocarcinoma T2a N1 M0 (Stage IVA) with focal positive parenchymal margins and 1/2 LN positive. CT C/A/P on 04/03/2016 showed no evidence of cholangiocarcinoma local recurrence or metastases at this time. Patient  has completed her adjuvant concurrent chemo-radiation  on 05/25/2015 . She did overall quite well with treatment without any overt prohibitive toxicities.   2) grade 2 fatigue -Improving though currently notes that she feels somewhat weak likely due to dehydration .  3) Mild thrombocytopenia PLT 110K likely due to chemotherapy.  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 Patient was seen in the emergency room on 05/23/2015 and had a CT abdomen for this which showed circumferential wall thickening in the gastric antrum and pylorus regions likely from radiation gastritis.  She was seen medicine clinic on 05/31/2015 and received IV fluids and much of different IV antinausea medications with relief. However notes that she has been persistently nauseous and throat are very day for the last 2 weeks. Has not been able to keep any of her antinausea medications down. Did not inform [place] the symptoms still today. Notes some upper abdominal pain as well.  5) dehydration Plan  -We will admit the patient to the hospitalist service today for further evaluation and management. -IV fluids -Repeat CT abdomen pelvis to check for gastric obstruction/bowel obstruction. -If gastric overt obstruction suspected might need to consult GI for possibility  of endoscopic  treatment. -Small frequent meals. -If no complete gastric outlet obstruction might consider scheduled pre-meal Reglan. -Zofran when necessary. -IV PPI and dexamethasone 4 mg daily, -Continue scopolamine patch.  5)  oropharyngeal thrush  -Status post oral nystatin swish and swallow for 2 weeks  We'll admit the patient today to the hospital for further evaluation and management.   RTC with Dr Irene Limbo in 1 weeks after discharge from hospital for monitoring.  I spent 25 minutes counseling the patient face to face. The total time spent in the appointment was 30 minutes and more than 50% was on counseling and direct patient cares in coordination of care with hospice team.    Sullivan Lone MD [place] AAHIVMS [facility] Hematology/Oncology Physician [facility]  (Office):       [phone] (Work cell):  [phone] (Fax):           [phone]

## 2015-06-14 NOTE — H&P (Signed)
History and Physical  Bonnie Peterson:[id number] DOB: [birth year] DOA: 06/14/2015  Referring physician: Dr. Brunetta Genera, Oncology PCP: Paula Compton, MD  Outpatient Specialists:  1. Medical Oncology: Dr. Brunetta Genera  Chief Complaint: Intractable nausea and vomiting.  HPI: Bonnie Peterson is a 70 y.o. female , single, lives alone and is independent of activities of daily living, PMH of intrahepatic invasive cholangiocarcinoma who completed adjuvant concurrent chemoradiation on 05/25/15 and CT chest, abdomen and pelvis on 04/03/16 showed no evidence of cholangiocarcinoma local recurrence or metastasis, DM 2, HTN, HLD, anxiety, depression, OSA, COPD, was sent from oncologist's office to medical floor as directed admission for evaluation and management of intractable nausea and vomiting. Patient gives approximately 1 month history of nausea and vomiting and unable to consistently keep down oral intake. She states that she vomits about twice a day once in the midafternoon and once early morning consisting of remnants of breakfast and dinner respectively. Her emesis usually occurs several hours after oral intake. No blood or coffee-ground emesis. Normal BM without melena or blood in stools. Intermittent 3/10 upper abdominal pain. Feels cold but denies fever or chills. Despite these complaints, states that she has not lost but has gained some weight. 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 She was seen in the medicine clinic on 2/22 and received IV fluids and IV antinausea medications with relief. Despite her ongoing GI symptoms, she did not bring this to the attention of her oncologist. Today she was seen by her PCP followed by her oncologist who then sent her to the hospital for evaluation. Patient denies any other complaints.   Review of Systems: All  systems reviewed and apart from history of presenting illness, are negative.  Past Medical History  Diagnosis Date  . Obstructive sleep apnea   . HTN, goal below 130/80   . HLD (hyperlipidemia)   . Benign positional vertigo   . Depression   . Anxiety   . Irritable bowel syndrome   . Obesity, Class III, BMI 40-49.9 (morbid obesity) ([place])   . Gout   . Restrictive lung disease   . Echocardiogram findings abnormal, without diagnosis 10/10    10/10: mild pulm HTN, EF 60-65%, mild LVH, moderate aortic regurg  . Early cataracts, bilateral 10/13    Optho, Dr Gershon Crane  . COPD (chronic obstructive pulmonary disease) ([place])   . Osteoarthritis (arthritis due to wear and tear of joints)     also gout  . Cancer [facility]) 02/09/15    intrahepatic cholangiocarcinoma   Past Surgical History  Procedure Laterality Date  . Partial hysterectomy    . Rotator cuff repair        L rotator cuff repair 11/07-[facility] - 12/7/200  . Tonsillectomy    . Liver biopsy    . Abdominal hysterectomy    . Open partial hepatectomy [83] Left 02/09/15   Social History:  reports that she has been smoking Cigarettes.  She has a 20 pack-year smoking history. She has never used smokeless tobacco. She reports that she drinks alcohol. She reports that she does not use illicit drugs. Denies tobacco or alcohol use. States that she smokes marijuana to improve her appetite and help with her nausea.  Allergies  Allergen Reactions  . Other     diprovan- Anesthesia for MRI--made her "breathe funny"    Family History  Problem Relation Age of Onset  . Breast cancer Mother   . Hypertension Mother   . Coronary artery disease Mother   . Diabetes type II Sister   . Pancreatic cancer Sister   . Diabetes type II Mother     Prior to Admission medications   Medication Sig Start Date End Date Taking? Authorizing Provider  albuterol (PROAIR HFA) 108 (90 BASE) MCG/ACT inhaler INHALE 2 PUFFS INTO THE LUNGS EVERY 4 HOURS AS NEEDED FOR  WHEEZING OR SHORTNESS OF BREATH 08/15/14  Yes Aquilla Hacker, MD  alendronate (FOSAMAX) 70 MG tablet TAKE 1 TABLET  ONCE A WEEK. Patient taking differently: TAKE 1 TABLET  ONCE A WEEK.--Sunday 08/29/14  Yes York Ram Melancon, MD  amLODipine (NORVASC) 10 MG tablet TAKE 1 TABLET (10 MG TOTAL) BY MOUTH DAILY WITH BREAKFAST. 05/10/15  Yes York Ram Melancon, MD  atenolol (TENORMIN) 50 MG tablet Take 1 tablet (50 mg total) by mouth daily. 05/12/14  Yes Aquilla Hacker, MD  baclofen (LIORESAL) 10 MG tablet Take 1 tablet (10 mg total) by mouth 3 (three) times daily as needed for muscle spasms. 08/30/14  Yes Aquilla Hacker, MD  cholecalciferol (VITAMIN D) 1000 UNITS tablet Take 1 tablet (1,000 Units total) by mouth daily. 08/15/14  Yes Aquilla Hacker, MD  colchicine 0.6 MG tablet TAKE 1 TABLET DAILY AS NEEDED FOR GOUT FLARE 03/22/15  Yes Aquilla Hacker, MD  dexamethasone (DECADRON) 4 MG tablet Take 1 tablet (4 mg total) by mouth 2 (two) times daily with breakfast and lunch. 05/31/15  Yes Brunetta Genera, MD  diclofenac sodium (VOLTAREN) 1 % GEL Apply 2 g topically 4 (four) times daily as needed. Patient taking differently: Apply 2 g topically 4 (four) times daily as needed (knee pain, elbow and hands).  05/12/14  Yes Aquilla Hacker, MD  enalapril (VASOTEC) 20 MG tablet Take 1 tablet (20 mg total) by mouth daily with breakfast. 08/02/14  Yes Katheren Shams, DO  fish oil-omega-3 fatty acids 1000 MG capsule Take 1 g by mouth every 30 (thirty) days.    Yes Historical Provider, MD  fluticasone (FLONASE) 50 MCG/ACT nasal spray Place 1 spray into the nose daily as needed for rhinitis. 03/17/12  Yes Waldemar Dickens, MD  hydrochlorothiazide (HYDRODIURIL) 25 MG tablet Take 25 mg by mouth daily. 05/22/15  Yes Historical Provider, MD  Hyprom-Naphaz-Polysorb-Zn Sulf (CLEAR EYES COMPLETE OP) Place 1 drop into both eyes daily as needed (dry eyes).    Yes Historical Provider, MD  loratadine (CLARITIN) 10 MG tablet Take 1  tablet (10 mg total) by mouth daily as needed for allergies. 09/23/12  Yes Waldemar Dickens, MD  metFORMIN (GLUCOPHAGE) 1000 MG tablet Take 1 tablet (1,000 mg total) by mouth 2 (two) times daily with a meal. Take 5102m each morning, 10057meach night Patient taking differently: Take 500-1,000 mg by mouth 2 (two) times daily with a meal. Take 50057mach morning, 1000m35mch night 05/12/14  Yes CaleAquilla Hacker  metoCLOPramide (REGLAN) 10 MG tablet Take 1 tablet (10 mg total) by mouth every 8 (eight) hours as needed for nausea or vomiting. 05/23/15  Yes EmilClayton Bibles-C  Multiple Vitamin (MULTIVITAMIN WITH MINERALS) TABS tablet Take 1 tablet by mouth daily.   Yes Historical Provider, MD  nystatin (MYCOSTATIN) 100000 UNIT/ML suspension Take 5 mLs (500,000 Units total) by mouth 4 (four) times daily. 05/31/15 06/14/15 Yes Gautam KishJuleen China  ondansetron (ZOFRAN) 8 MG tablet Take 1  tablet (8 mg total) by mouth every 8 (eight) hours as needed for nausea or vomiting. 05/17/15  Yes Gautam Juleen China, MD  Pneumococcal Vac Polyvalent (PNEUMOVAX 23 IJ) Inject into the skin once.   Yes Historical Provider, MD  scopolamine (TRANSDERM-SCOP) 1 MG/3DAYS Place 1 patch (1.5 mg total) onto the skin every 3 (three) days. 05/31/15  Yes Brunetta Genera, MD  sucralfate (CARAFATE) 1 GM/10ML suspension Take 10 mLs (1 g total) by mouth 4 (four) times daily -  with meals and at bedtime. 05/31/15  Yes Brunetta Genera, MD  tiotropium (SPIRIVA) 18 MCG inhalation capsule Place 1 capsule (18 mcg total) into inhaler and inhale as needed (if its hot outside). 08/15/14  Yes Aquilla Hacker, MD  traMADol (ULTRAM) 50 MG tablet Take 1 tablet (50 mg total) by mouth every 6 (six) hours as needed for moderate pain or severe pain. 04/12/15  Yes Brunetta Genera, MD  VITAMIN E PO Take 1 tablet by mouth daily.    Yes Historical Provider, MD  ondansetron (ZOFRAN ODT) 8 MG disintegrating tablet Take 1 tablet (8 mg total) by mouth every 8  (eight) hours as needed for nausea or vomiting. Patient not taking: Reported on 06/14/2015 06/14/15   Aquilla Hacker, MD  Tdap Durwin Reges) 5-2.5-18.5 LF-MCG/0.5 injection Inject 0.5 mLs into the muscle once. Patient not taking: Reported on 06/14/2015 06/14/15   Aquilla Hacker, MD   Physical Exam: Filed Vitals:   06/14/15 1700  BP: 122/70  Pulse: 71  Temp: 98 F (36.7 C)  TempSrc: Oral  Resp: 16  Height: 5' 3"  (1.6 m)  Weight: 85.73 kg (189 lb)  SpO2: 100%     General exam: Moderately built and nourished pleasant middle-aged female patient, lying comfortably supine in bed in no obvious distress.  Head, eyes and ENT: Nontraumatic and normocephalic. Pupils equally reacting to light and accommodation. Oral mucosa slightly dry.  Neck: Supple. No JVD, carotid bruit or thyromegaly.  Lymphatics: No lymphadenopathy.  Respiratory system: Clear to auscultation. No increased work of breathing.  Cardiovascular system: S1 and S2 heard, RRR. No JVD, murmurs, gallops, clicks or pedal edema.  Gastrointestinal system: Abdomen is mildly distended but soft. Extensive upper abdomen horizontal laparotomy scar extending from RUQ to LUQ. Mild epigastric tenderness without peritoneal signs. Normal bowel sounds heard. No organomegaly or masses appreciated.  Central nervous system: Alert and oriented. No focal neurological deficits.  Extremities: Symmetric 5 x 5 power. Peripheral pulses symmetrically felt.   Skin: No rashes or acute findings.  Musculoskeletal system: Negative exam.  Psychiatry: Pleasant and cooperative.   Labs on Admission:  Basic Metabolic Panel:  Recent Labs Lab 06/14/15 1256  NA 138  K 3.6  CO2 26  GLUCOSE 153*  BUN 6.0*  CREATININE 0.7  CALCIUM 8.3*   Liver Function Tests:  Recent Labs Lab 06/14/15 1256  AST 18  ALT 13  ALKPHOS 76  BILITOT 0.59  PROT 5.8*  ALBUMIN 2.7*   No results for input(s): LIPASE, AMYLASE in the last 168 hours. No results for  input(s): AMMONIA in the last 168 hours. CBC:  Recent Labs Lab 06/14/15 1256  WBC 9.1  NEUTROABS 3.9  HGB 11.5*  HCT 35.5  MCV 89.9  PLT 92*   Cardiac Enzymes: No results for input(s): CKTOTAL, CKMB, CKMBINDEX, TROPONINI in the last 168 hours.  BNP (last 3 results) No results for input(s): PROBNP in the last 8760 hours. CBG: No results for input(s): GLUCAP in the last 168  hours.  Radiological Exams on Admission: No results found.    Assessment/Plan Active Problems:   Diabetes mellitus, type 2 (HCC)   Major depressive disorder, recurrent episode (HCC)   HYPERTENSION, BENIGN SYSTEMIC   COPD (chronic obstructive pulmonary disease) (HCC)   Radiation gastritis   Dehydration   Intractable nausea and vomiting   Intractable nausea and some vomiting - Concern for gastric outlet obstruction - As per oncology recommendations, will obtain CT abdomen and pelvis with contrast to further evaluate - For now place on clear liquids, IV Protonix, IV Decadron 4 MG daily, when necessary IV Zofran and Phenergan. Gentle IV fluids. Continue scopolamine patch. - If patient does have gastric outlet obstruction, will consult GI for possible endoscopy treatment. If no gastric outlet obstruction, consider Reglan.  Dehydration - Secondary to poor oral intake and GI losses. IV fluids.  Anemia - Secondary to chronic disease - Follow CBCs  Thrombocytopenia - Likely from chemotherapy. Follow CBC in a.m. No bleeding reported.  Intrahepatic invasive cholangiocarcinoma - Status post adjuvant concurrent chemoradiation without recurrence at this time. Follow with oncology as outpatient.  Type II DM - Hold oral medications. SSI.  Essential hypertension - Controlled off of medications. Monitor for now.  Marijuana use - For nausea and improved appetite per patient  COPD - Stable  Anxiety & depression - Stable without suicidal or homicidal ideations or audiovisual hallucinations. He was not  on medications PTA.      DVT Prophylaxis: Lovenox Code Status: Full   Family Communication: Discussed extensively with patient's daughter at bedside.   Disposition Plan: DC home when medically stable, possibly in 2-3 days.   Time spent: 5 minutes   Jahseh Lucchese, MD, FACP, FHM. [facility] Pager [phone]  If 7PM-7AM, please contact night-coverage [email or web address] Password [email or web address] 06/14/2015, 5:59 PM

## 2015-06-14 NOTE — Addendum Note (Signed)
Addended by: Katharina Caper, Jazariah Teall D on: 06/14/2015 02:13 PM   Modules accepted: Orders

## 2015-06-15 ENCOUNTER — Encounter (HOSPITAL_COMMUNITY)
Admission: AD | Disposition: A | Discharge status: Home / Self Care | Payer: Self-pay | Source: Ambulatory Visit | Attending: Internal Medicine

## 2015-06-15 ENCOUNTER — Encounter (HOSPITAL_COMMUNITY): Payer: Self-pay

## 2015-06-15 DIAGNOSIS — B3789 Other sites of candidiasis: Secondary | ICD-10-CM

## 2015-06-15 DIAGNOSIS — R933 Abnormal findings on diagnostic imaging of other parts of digestive tract: Secondary | ICD-10-CM

## 2015-06-15 DIAGNOSIS — R5383 Other fatigue: Secondary | ICD-10-CM

## 2015-06-15 DIAGNOSIS — C779 Secondary and unspecified malignant neoplasm of lymph node, unspecified: Secondary | ICD-10-CM

## 2015-06-15 DIAGNOSIS — D696 Thrombocytopenia, unspecified: Secondary | ICD-10-CM

## 2015-06-15 DIAGNOSIS — C221 Intrahepatic bile duct carcinoma: Secondary | ICD-10-CM

## 2015-06-15 DIAGNOSIS — K2960 Other gastritis without bleeding: Secondary | ICD-10-CM

## 2015-06-15 DIAGNOSIS — R1110 Vomiting, unspecified: Secondary | ICD-10-CM

## 2015-06-15 HISTORY — PX: ESOPHAGOGASTRODUODENOSCOPY: SHX5428

## 2015-06-15 LAB — GLUCOSE, CAPILLARY
Glucose-Capillary: 173 mg/dL — ABNORMAL HIGH (ref 65–99)
Glucose-Capillary: 95 mg/dL (ref 65–99)
Glucose-Capillary: 86 mg/dL (ref 65–99)
Glucose-Capillary: 234 mg/dL — ABNORMAL HIGH (ref 65–99)

## 2015-06-15 LAB — BASIC METABOLIC PANEL
Anion gap: 9 (ref 5–15)
BUN: 6 mg/dL (ref 6–20)
CO2: 23 mmol/L (ref 22–32)
Calcium: 7.7 mg/dL — ABNORMAL LOW (ref 8.9–10.3)
Chloride: 102 mmol/L (ref 101–111)
Creatinine, Ser: 0.54 mg/dL (ref 0.44–1.00)
GFR calc Af Amer: >60 mL/min (ref >60)
GFR calc non Af Amer: >60 mL/min (ref >60)
Glucose, Bld: 138 mg/dL — ABNORMAL HIGH (ref 65–99)
Potassium: 3.7 mmol/L (ref 3.5–5.1)
Sodium: 134 mmol/L — ABNORMAL LOW (ref 135–145)

## 2015-06-15 LAB — CBC
HCT: 32.3 % — ABNORMAL LOW (ref 36.0–46.0)
Hemoglobin: 10.3 g/dL — ABNORMAL LOW (ref 12.0–15.0)
MCH: 29.3 pg (ref 26.0–34.0)
MCHC: 31.9 g/dL (ref 30.0–36.0)
MCV: 91.8 fL (ref 78.0–100.0)
Platelets: 94 10*3/uL — ABNORMAL LOW (ref 150–400)
RBC: 3.52 MIL/uL — ABNORMAL LOW (ref 3.87–5.11)
RDW: 19.4 % — ABNORMAL HIGH (ref 11.5–15.5)
WBC: 6.3 10*3/uL (ref 4.0–10.5)

## 2015-06-15 SURGERY — EGD (ESOPHAGOGASTRODUODENOSCOPY)
Anesthesia: Moderate Sedation

## 2015-06-15 MED ORDER — POTASSIUM CHLORIDE IN NACL 20-0.9 MEQ/L-% IV SOLN
INTRAVENOUS | Status: AC
  Administered 2015-06-16: 07:00:00 via INTRAVENOUS
  Filled 2015-06-15: qty 1000

## 2015-06-15 MED ORDER — SUCRALFATE 1 GM/10ML PO SUSP
1.0000 g | Freq: Three times a day (TID) | ORAL | Status: DC
  Administered 2015-06-15: 1 g via ORAL
  Filled 2015-06-15: qty 10

## 2015-06-15 MED ORDER — BUTAMBEN-TETRACAINE-BENZOCAINE 2-2-14 % EX AERO
INHALATION_SPRAY | CUTANEOUS | Status: DC | PRN
  Administered 2015-06-15: 1 via TOPICAL

## 2015-06-15 MED ORDER — PANTOPRAZOLE SODIUM 40 MG IV SOLR
40.0000 mg | Freq: Two times a day (BID) | INTRAVENOUS | Status: DC
  Administered 2015-06-15 – 2015-06-16 (×2): 40 mg via INTRAVENOUS
  Filled 2015-06-15 (×2): qty 40

## 2015-06-15 MED ORDER — FENTANYL CITRATE (PF) 100 MCG/2ML IJ SOLN
INTRAMUSCULAR | Status: DC | PRN
  Administered 2015-06-15 (×2): 25 ug via INTRAVENOUS

## 2015-06-15 MED ORDER — DIPHENHYDRAMINE HCL 50 MG/ML IJ SOLN
INTRAMUSCULAR | Status: AC
  Filled 2015-06-15: qty 1

## 2015-06-15 MED ORDER — MIDAZOLAM HCL 10 MG/2ML IJ SOLN
INTRAMUSCULAR | Status: DC | PRN
  Administered 2015-06-15 (×2): 2 mg via INTRAVENOUS
  Administered 2015-06-15: 1 mg via INTRAVENOUS

## 2015-06-15 MED ORDER — SUCRALFATE 1 GM/10ML PO SUSP
1.0000 g | Freq: Three times a day (TID) | ORAL | Status: DC
  Administered 2015-06-15 – 2015-06-16 (×3): 1 g via ORAL
  Filled 2015-06-15 (×3): qty 10

## 2015-06-15 MED ORDER — FENTANYL CITRATE (PF) 100 MCG/2ML IJ SOLN
INTRAMUSCULAR | Status: AC
  Filled 2015-06-15: qty 2

## 2015-06-15 MED ORDER — SODIUM CHLORIDE 0.9 % IV SOLN
INTRAVENOUS | Status: DC
  Administered 2015-06-15: 500 mL via INTRAVENOUS

## 2015-06-15 MED ORDER — MIDAZOLAM HCL 5 MG/ML IJ SOLN
INTRAMUSCULAR | Status: AC
  Filled 2015-06-15: qty 2

## 2015-06-15 NOTE — Consult Note (Signed)
Referring Provider: [facility] Primary Care Physician:  Paula Compton, MD Primary Gastroenterologist:  Dr. [name]  Reason for Consultation:   Nausea, vomiting    HPI: Bonnie Peterson is a 70 y.o. female with a history of type 2 diabetes, hypertension, hyperlipidemia, anxiety, depression, sleep apnea, COPD, as well as intrahepatic invasive cholangiocarcinoma. She completed adjuvant concurrent chemoradiation on 05/25/2015. CT chest, abdomen, and pelvis on February 14 showed circumferential wall thickening in the gastric antrum and pylorus region.   She presented with a one-month history of nausea. She states that she constantly feels hungry and can "eat a house". She denies early satiety but states 6-8 hours after eating she will vomit undigested food and bile. She denies hematemesis or coffee ground emesis. Bowel movements normal. She has not lost any weight. She was evaluated in medical clinic on February 22 she was given antiemetics and IV fluids which provided some relief. She has periodically used marijuana for her nausea with varying degrees of relief. She was evaluated by her oncologist yesterday center to the hospital for evaluation. Laboratory studies revealed a white blood count of 9.1 and hemoglobin of 11.5. BUN was 6 and creatinine 0.7. Repeat CT shows that the stomach and duodenum demonstrated wall thickening possibly indicating gastritis 1190s. There is no gastric distention and contrast material passes through the stomach into the small bowel and colon. No evidence of obstruction. Small bowel and colon are not abnormally distended. Patient reports she has been having increasing heartburn over the past month and has been hiccuping every night. She denies dysphagia or odynophagia. She states every time she vomits she will pick up for a period of time thereafter.   Past Medical History  Diagnosis Date  . Obstructive sleep apnea   . HTN, goal below 130/80   . HLD (hyperlipidemia)    . Benign positional vertigo   . Depression   . Anxiety   . Irritable bowel syndrome   . Obesity, Class III, BMI 40-49.9 (morbid obesity) ([place])   . Gout   . Restrictive lung disease   . Echocardiogram findings abnormal, without diagnosis 10/10    10/10: mild pulm HTN, EF 60-65%, mild LVH, moderate aortic regurg  . Early cataracts, bilateral 10/13    Optho, Dr Gershon Crane  . COPD (chronic obstructive pulmonary disease) ([place])   . Osteoarthritis (arthritis due to wear and tear of joints)     also gout  . Cancer [facility]) 02/09/15    intrahepatic cholangiocarcinoma    Past Surgical History  Procedure Laterality Date  . Partial hysterectomy    . Rotator cuff repair        L rotator cuff repair 11/07-[place] - 12/7/200  . Tonsillectomy    . Liver biopsy    . Abdominal hysterectomy    . Open partial hepatectomy [83] Left 02/09/15    Prior to Admission medications   Medication Sig Start Date End Date Taking? Authorizing Provider  albuterol (PROAIR HFA) 108 (90 BASE) MCG/ACT inhaler INHALE 2 PUFFS INTO THE LUNGS EVERY 4 HOURS AS NEEDED FOR WHEEZING OR SHORTNESS OF BREATH 08/15/14  Yes Aquilla Hacker, MD  alendronate (FOSAMAX) 70 MG tablet TAKE 1 TABLET  ONCE A WEEK. Patient taking differently: TAKE 1 TABLET  ONCE A WEEK.--Sunday 08/29/14  Yes York Ram Melancon, MD  amLODipine (NORVASC) 10 MG tablet TAKE 1 TABLET (10 MG TOTAL) BY MOUTH DAILY WITH BREAKFAST. 05/10/15  Yes York Ram Melancon, MD  atenolol (TENORMIN) 50 MG tablet Take 1 tablet (50  mg total) by mouth daily. 05/12/14  Yes Aquilla Hacker, MD  baclofen (LIORESAL) 10 MG tablet Take 1 tablet (10 mg total) by mouth 3 (three) times daily as needed for muscle spasms. 08/30/14  Yes Aquilla Hacker, MD  cholecalciferol (VITAMIN D) 1000 UNITS tablet Take 1 tablet (1,000 Units total) by mouth daily. 08/15/14  Yes Aquilla Hacker, MD  colchicine 0.6 MG tablet TAKE 1 TABLET DAILY AS NEEDED FOR GOUT FLARE 03/22/15  Yes Aquilla Hacker, MD  dexamethasone  (DECADRON) 4 MG tablet Take 1 tablet (4 mg total) by mouth 2 (two) times daily with breakfast and lunch. 05/31/15  Yes Brunetta Genera, MD  diclofenac sodium (VOLTAREN) 1 % GEL Apply 2 g topically 4 (four) times daily as needed. Patient taking differently: Apply 2 g topically 4 (four) times daily as needed (knee pain, elbow and hands).  05/12/14  Yes Aquilla Hacker, MD  enalapril (VASOTEC) 20 MG tablet Take 1 tablet (20 mg total) by mouth daily with breakfast. 08/02/14  Yes Katheren Shams, DO  fish oil-omega-3 fatty acids 1000 MG capsule Take 1 g by mouth every 30 (thirty) days.    Yes Historical Provider, MD  fluticasone (FLONASE) 50 MCG/ACT nasal spray Place 1 spray into the nose daily as needed for rhinitis. 03/17/12  Yes Waldemar Dickens, MD  hydrochlorothiazide (HYDRODIURIL) 25 MG tablet Take 25 mg by mouth daily. 05/22/15  Yes Historical Provider, MD  Hyprom-Naphaz-Polysorb-Zn Sulf (CLEAR EYES COMPLETE OP) Place 1 drop into both eyes daily as needed (dry eyes).    Yes Historical Provider, MD  loratadine (CLARITIN) 10 MG tablet Take 1 tablet (10 mg total) by mouth daily as needed for allergies. 09/23/12  Yes Waldemar Dickens, MD  metFORMIN (GLUCOPHAGE) 1000 MG tablet Take 1 tablet (1,000 mg total) by mouth 2 (two) times daily with a meal. Take 500mg  each morning, 1000mg  each night Patient taking differently: Take 500-1,000 mg by mouth 2 (two) times daily with a meal. Take 500mg  each morning, 1000mg  each night 05/12/14  Yes Aquilla Hacker, MD  metoCLOPramide (REGLAN) 10 MG tablet Take 1 tablet (10 mg total) by mouth every 8 (eight) hours as needed for nausea or vomiting. 05/23/15  Yes Clayton Bibles, PA-C  Multiple Vitamin (MULTIVITAMIN WITH MINERALS) TABS tablet Take 1 tablet by mouth daily.   Yes Historical Provider, MD  ondansetron (ZOFRAN) 8 MG tablet Take 1 tablet (8 mg total) by mouth every 8 (eight) hours as needed for nausea or vomiting. 05/17/15  Yes Gautam Juleen China, MD  Pneumococcal Vac  Polyvalent (PNEUMOVAX 23 IJ) Inject into the skin once.   Yes Historical Provider, MD  scopolamine (TRANSDERM-SCOP) 1 MG/3DAYS Place 1 patch (1.5 mg total) onto the skin every 3 (three) days. 05/31/15  Yes Brunetta Genera, MD  sucralfate (CARAFATE) 1 GM/10ML suspension Take 10 mLs (1 g total) by mouth 4 (four) times daily -  with meals and at bedtime. 05/31/15  Yes Brunetta Genera, MD  tiotropium (SPIRIVA) 18 MCG inhalation capsule Place 1 capsule (18 mcg total) into inhaler and inhale as needed (if its hot outside). 08/15/14  Yes Aquilla Hacker, MD  traMADol (ULTRAM) 50 MG tablet Take 1 tablet (50 mg total) by mouth every 6 (six) hours as needed for moderate pain or severe pain. 04/12/15  Yes Brunetta Genera, MD  VITAMIN E PO Take 1 tablet by mouth daily.    Yes Historical Provider, MD  ondansetron (ZOFRAN ODT) 8  MG disintegrating tablet Take 1 tablet (8 mg total) by mouth every 8 (eight) hours as needed for nausea or vomiting. Patient not taking: Reported on 06/14/2015 06/14/15   Aquilla Hacker, MD  Tdap Durwin Reges) 5-2.5-18.5 LF-MCG/0.5 injection Inject 0.5 mLs into the muscle once. Patient not taking: Reported on 06/14/2015 06/14/15   Aquilla Hacker, MD    Current Facility-Administered Medications  Medication Dose Route Frequency Provider Last Rate Last Dose  . 0.9 % NaCl with KCl 20 mEq/ L  infusion   Intravenous Continuous Modena Jansky, MD 75 mL/hr at 06/14/15 1947    . acetaminophen (TYLENOL) tablet 650 mg  650 mg Oral Q6H PRN Modena Jansky, MD       Or  . acetaminophen (TYLENOL) suppository 650 mg  650 mg Rectal Q6H PRN Modena Jansky, MD      . albuterol (PROVENTIL) (2.5 MG/3ML) 0.083% nebulizer solution 2.5 mg  2.5 mg Nebulization Q2H PRN Modena Jansky, MD      . dexamethasone (DECADRON) injection 4 mg  4 mg Intravenous Q24H Modena Jansky, MD      . enoxaparin (LOVENOX) injection 40 mg  40 mg Subcutaneous Q24H Modena Jansky, MD   40 mg at 06/14/15 2049  . insulin  aspart (novoLOG) injection 0-9 Units  0-9 Units Subcutaneous TID WC Modena Jansky, MD   2 Units at 06/15/15 0818  . morphine 2 MG/ML injection 1 mg  1 mg Intravenous Q4H PRN Modena Jansky, MD      . ondansetron [facility]) tablet 4 mg  4 mg Oral Q6H PRN Modena Jansky, MD       Or  . ondansetron (ZOFRAN) injection 4 mg  4 mg Intravenous Q6H PRN Modena Jansky, MD   4 mg at 06/14/15 1947  . pantoprazole (PROTONIX) injection 40 mg  40 mg Intravenous Q24H Modena Jansky, MD      . promethazine (PHENERGAN) injection 12.5 mg  12.5 mg Intravenous Q6H PRN Modena Jansky, MD      . Derrill Memo ON 06/16/2015] scopolamine (TRANSDERM-SCOP) 1 MG/3DAYS 1.5 mg  1 patch Transdermal Q72H Modena Jansky, MD        Allergies as of 06/14/2015 - Review Complete 06/14/2015  Allergen Reaction Noted  . Other  09/29/2014    Family History  Problem Relation Age of Onset  . Breast cancer Mother   . Hypertension Mother   . Coronary artery disease Mother   . Diabetes type II Sister   . Pancreatic cancer Sister   . Diabetes type II Mother     Social History   Social History  . Marital Status: Single    Spouse Name: N/A  . Number of Children: 4  . Years of Education: N/A   Occupational History  . unemployed    Social History Main Topics  . Smoking status: Current Some Day Smoker -- 0.50 packs/day for 40 years    Types: Cigarettes  . Smokeless tobacco: Never Used  . Alcohol Use: 0.0 oz/week    0 Standard drinks or equivalent per week     Comment: occasionally  . Drug Use: No  . Sexual Activity: No   Other Topics Concern  . 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 Resp: Denies dyspnea at rest, dyspnea with exercise, cough, sputum, wheezing, coughing up blood, and pleurisy. GI: Admits to nausea and vomiting GU : Denies urinary burning, blood in urine, urinary frequency, urinary hesitancy, nocturnal urination, and urinary incontinence. MS: Denies joint pain, limitation of movement, and swelling, stiffness, low back pain, extremity pain. Denies muscle weakness, cramps, atrophy.  Derm: Denies rash, itching, dry skin, hives, moles, warts, or unhealing ulcers.  Psych: Denies depression, anxiety, memory loss, suicidal ideation, hallucinations, paranoia, and confusion. Heme: Denies bruising, bleeding, and enlarged lymph nodes. Neuro:  Denies any headaches, dizziness, paresthesias.   Physical Exam: Vital signs in last 24 hours: Temp:  [98 F (36.7 C)-99.6 F (37.6 C)] 99.6 F (37.6 C) (03/09 0426) Pulse Rate:  [71-81] 81 (03/09 0426) Resp:  [14-16] 14 (03/09 0426) BP: (114-122)/(55-70) 119/70 mmHg (03/09 0426) SpO2:  [94 %-100 %] 94 % (03/09 0426) Weight:  [187 lb 12.8 oz (85.186 kg)-189 lb 4.8 oz (85.866 kg)] 187 lb 12.8 oz (85.186 kg) (03/09 0426) Last BM Date: 06/13/15 General:   Alert,  Well-developed, well-nourished, pleasant and cooperative in NAD Head:  Normocephalic and atraumatic. Eyes:  Sclera clear, no icterus  Conjunctiva pink. Ears:  Normal auditory acuity. Nose:  No deformity, discharge,  or lesions. Mouth:  No deformity or lesions.   Neck:  Supple; no masses or thyromegaly. Lungs:  Clear throughout to auscultation.   No wheezes, crackles, or rhonchi.  Heart:  Regular rate and rhythm; no murmurs, clicks, rubs,  or gallops. Abdomen:  Soft, mild epigastric tenderness to palpation, mild distention, healed horizontal scar  from laparotomy,  BS active,nonpalp mass or hsm.   Msk:  Symmetrical without gross deformities. . Pulses:  Normal pulses noted. Extremities:  Without clubbing or edema. Neurologic Alert and  oriented x4;  grossly normal neurologically. Skin:  Intact without significant lesions or rashes.. Psych:  Alert and cooperative. Normal mood and affect.   Lab Results:  Recent Labs  06/14/15 1256 06/15/15 0328  WBC 9.1 6.3  HGB 11.5* 10.3*  HCT 35.5 32.3*  PLT 92* 94*   BMET  Recent Labs  06/14/15 1256 06/15/15 0328  NA 138 134*  K 3.6 3.7  CL  --  102  CO2 26 23  GLUCOSE 153* 138*  BUN 6.0* 6  CREATININE 0.7 0.54  CALCIUM 8.3* 7.7*   LFT  Recent Labs  06/14/15 1256  PROT 5.8*  ALBUMIN 2.7*  AST 18  ALT 13  ALKPHOS 76  BILITOT 0.59    Studies/Results: Ct Abdomen Pelvis W Contrast  06/14/2015  CLINICAL DATA:  Nausea and vomiting. Concern for gastric outlet obstruction. History of cholangiocarcinoma, completed chemotherapy. EXAM: CT ABDOMEN AND PELVIS WITH CONTRAST TECHNIQUE: Multidetector CT imaging of the abdomen and pelvis was performed using the standard protocol following bolus administration of intravenous contrast. CONTRAST:  124mL OMNIPAQUE IOHEXOL 300 MG/ML  SOLN COMPARISON:  05/23/2015 FINDINGS: Atelectasis in the lung bases.  Coronary artery calcifications. Postoperative changes with left hepatectomy. Surgical clips along the surgical edge of the liver. Mild edema adjacent to the anastomosis. Gallbladder is surgically absent. Bile ducts are not dilated. The spleen size is  normal. Small accessory spleen. Tiny sub cm low-attenuation lesion in the upper spleen likely represents a small cyst or hemangioma. The lesion has been present on previous studies dating back to 03/01/2013 without significant interval change. Pancreas, adrenal glands, kidneys, abdominal aorta, and inferior vena cava are unremarkable. Stomach and duodenum demonstrate wall thickening possibly indicating  gastritis and duodenitis. There is no gastric distention and contrast material passes through the stomach into the small bowel and colon. No evidence of obstruction. Small bowel and colon are not abnormally distended. No free air or free fluid in the abdomen. Surgical scarring in the anterior abdominal wall with small midline hernia containing fat. Pelvis: Appendix is normal. Bladder wall is not thickened. Uterus is surgically absent. No free or loculated pelvic fluid collections. No pelvic mass or lymphadenopathy. Degenerative changes in the spine and hips. No destructive bone lesions. IMPRESSION: Previous left hepatectomy. Wall thickening in the stomach and proximal duodenum may indicate gastritis and duodenitis or peptic ulcer disease. No evidence of outlet obstruction. Electronically Signed   By: Lucienne Capers M.D.   On: 06/14/2015 22:11    IMPRESSION/PLAN: 70 year old female with a history of intrahepatic invasive cholangiocarcinoma status post adjuvant concurrent chemoradiation completed in February 2017, presenting with intractable nausea and vomiting. CT with no evidence of obstruction but does demonstrate thickening in the stomach and proximal duodenum possibly related to gastritis, duodenitis, or peptic ulcer disease. Continue supportive care with IV hydration and antiemetics. Continue PPI. Will plan on EGD later today to evaluate for gastritis, ulcer, duodenitis, etc.The risks, benefits, and alternatives to endoscopy with possible biopsy and possible dilation were discussed with the patient and they consent to proceed.     Hvozdovic, Deloris Ping 06/15/2015,  Pager [phone]  Mon-Fri 8a-5p [phone] after 5p, weekends, holidays    [facility] Attending   I have taken an interval history, reviewed the chart and examined the patient. I agree with the Advanced Practitioner's note, impression and recommendations.    The risks and benefits as well as alternatives of endoscopic procedure(s) have  been discussed and reviewed. All questions answered. The patient agrees to proceed.  Gatha Mayer, MD, [facility] [phone] (pager) [phone] after 5 PM, weekends and holidays  06/15/2015 2:54 PM

## 2015-06-15 NOTE — Progress Notes (Signed)
[name]    HEMATOLOGY/ONCOLOGY Progress NOTE  Date of Service: 06/15/2015   Patient Care Team: Aquilla Hacker, MD as PCP - General   DIAGNOSIS  1)Stage IVA (T2a, pN1, M0) Intrahepatic Cholangiocarcinoma 2) Radiation gastritis with mild pylori stenosis  Completed Treatment  -Concurrent Capecitabine 887m/m2 po BID M-F while on concurrent RT Final date of RT planned for 05/25/2015  Previous treatment 02/09/15 L lobe of liver partial hepatectomy segments 2, 3, 4a, 4b invasive cholangiocarcinoma, moderately differentiated. Tumor focally extends to the parenchymal margin of resection. Uninvolved liver parenchyma with steatohepatitis and associated periportal and centrilobular pericellular fibrosis. Scattered von Meyenburg complexes. One hepatic artery LN with metastatic cholangiocarcinoma. Fibroadipose tissue and nerve negative for malignancy. Gallbladder negative for malignancy. One LN negative for malignancy. T2aN1 invasive cholangiocarcinoma, moderately differentiated, with positive hepatic parenchymal margin, and 1/2 nodes positive for tumor involvement.    Interval History  Bonnie MBarnowas seen in the hospital this afternoon. She had a CT scan of the abdomen and pelvis since admission yesterday which showed evidence of gastritis and duodenitis without overt gastric outlet obstruction. EGD done today shows significant radiation gastritis with mild pyloric stenosis. No overt evidence of bleeding. Patient notes that her nausea has been well controlled with her current medications. She is surrounded by family and appears to be in good spirits. No other acute new symptoms.   MEDICAL HISTORY:  Past Medical History  Diagnosis Date  . Obstructive sleep apnea   . HTN, goal below 130/80   . HLD (hyperlipidemia)   . Benign positional vertigo   . Depression   . Anxiety   . Irritable bowel syndrome   . Obesity, Class III, BMI 40-49.9 (morbid obesity) ([place]   . Gout   . Restrictive lung disease    . Echocardiogram findings abnormal, without diagnosis 10/10    10/10: mild pulm HTN, EF 60-65%, mild LVH, moderate aortic regurg  . Early cataracts, bilateral 10/13    Optho, Dr SGershon Crane . COPD (chronic obstructive pulmonary disease) ([facility]   . Osteoarthritis (arthritis due to wear and tear of joints)     also gout  . Cancer ([facility] 02/09/15    intrahepatic cholangiocarcinoma   SURGICAL HISTORY: Past Surgical History  Procedure Laterality Date  . Partial hysterectomy    . Rotator cuff repair        L rotator cuff repair 11/07-[facility] - 12/7/200  . Tonsillectomy    . Liver biopsy    . Abdominal hysterectomy    . Open partial hepatectomy [83] Left 02/09/15    SOCIAL HISTORY: Social History   Social History  . Marital Status: Single    Spouse Name: N/A  . Number of Children: 4  . Years of Education: N/A   Occupational History  . unemployed    Social History Main Topics  . Smoking status: Current Some Day Smoker -- 0.50 packs/day for 40 years    Types: Cigarettes  . Smokeless tobacco: Never Used  . Alcohol Use: 0.0 oz/week    0 Standard drinks or equivalent per week     Comment: occasionally  . Drug Use: No  . Sexual Activity: No   Other Topics Concern  . 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 Breast cancer Mother   . Hypertension Mother   . Coronary artery disease Mother   . Diabetes type II Sister   . Pancreatic cancer Sister   . Diabetes type II Mother     ALLERGIES:  is allergic to other.  MEDICATIONS:  Current Facility-Administered Medications  Medication Dose Route Frequency Provider  Last Rate Last Dose  . 0.9 % NaCl with KCl 20 mEq/ L  infusion   Intravenous Continuous Modena Jansky, MD 50 mL/hr at 06/15/15 1300    . acetaminophen (TYLENOL) tablet 650 mg  650 mg Oral Q6H PRN Modena Jansky, MD       Or  . acetaminophen (TYLENOL) suppository 650 mg  650 mg Rectal Q6H PRN Modena Jansky, MD      . albuterol (PROVENTIL) (2.5 MG/3ML) 0.083% nebulizer solution 2.5 mg  2.5 mg Nebulization Q2H PRN Modena Jansky, MD      . dexamethasone (DECADRON) injection 4 mg  4 mg Intravenous Q24H Modena Jansky, MD   4 mg at 06/15/15 1659  . enoxaparin (LOVENOX) injection 40 mg  40 mg Subcutaneous Q24H Modena Jansky, MD   40 mg at 06/14/15 2049  . insulin aspart (novoLOG) injection 0-9 Units  0-9 Units Subcutaneous TID WC Modena Jansky, MD   2 Units at 06/15/15 0818  . morphine 2 MG/ML injection 1 mg  1 mg Intravenous Q4H PRN Modena Jansky, MD      . ondansetron [facility]) tablet 4 mg  4 mg Oral Q6H PRN Modena Jansky, MD       Or  . ondansetron (ZOFRAN) injection 4 mg  4 mg Intravenous Q6H PRN Modena Jansky, MD   4 mg at 06/14/15 1947  . pantoprazole (PROTONIX) injection 40 mg  40 mg Intravenous Q12H Gatha Mayer, MD      . promethazine (PHENERGAN) injection 12.5 mg  12.5 mg Intravenous Q6H PRN Modena Jansky, MD      . Derrill Memo ON 06/16/2015] scopolamine (TRANSDERM-SCOP) 1 MG/3DAYS 1.5 mg  1 patch Transdermal Q72H Modena Jansky, MD      . sucralfate (CARAFATE) 1 GM/10ML suspension 1 g  1 g Oral TID WC & HS Gatha Mayer, MD   1 g at 06/15/15 1659    REVIEW OF SYSTEMS:    10 Point review of Systems was done is negative except as noted above.  PHYSICAL EXAMINATION: ECOG PERFORMANCE STATUS: 1 - Symptomatic but completely ambulatory .BP 140/61 mmHg  Pulse 67  Temp(Src) 98.9 F (37.2 C) (Oral)  Resp 20  Ht 5' 3"  (1.6 m)  Wt 187 lb 12.8 oz (85.186 kg)  BMI 33.28 kg/m2  SpO2 96%   GENERAL:alert, in no acute distress and comfortable SKIN: skin color,  texture, turgor are normal, no rashes or significant lesions EYES: normal, conjunctiva are pink and non-injected, sclera clear OROPHARYNX: Mucous membranes dry , significant oropharyngeal thrush noted. NECK: supple, no JVD, thyroid normal size, non-tender, without nodularity LYMPH:  no palpable lymphadenopathy in the cervical, axillary or inguinal LUNGS: clear to auscultation with normal respiratory effort HEART: regular rate & rhythm,  no murmurs and no lower extremity edema ABDOMEN: abdomen appears distended especially in the upper belly. Hypoactive bowel sounds. No guarding rigidity or rebound. Musculoskeletal: no cyanosis of  digits and no clubbing  PSYCH: alert & oriented x 3 with fluent speech NEURO: no focal motor/sensory deficits  LABORATORY DATA:  . CBC Latest Ref Rng 06/15/2015 06/14/2015 05/31/2015  WBC 4.0 - 10.5 K/uL 6.3 9.1 4.1  Hemoglobin 12.0 - 15.0 g/dL 10.3(L) 11.5(L) 12.5  Hematocrit 36.0 - 46.0 % 32.3(L) 35.5 38.2  Platelets 150 - 400 K/uL 94(L) 92(L) 110(L)   . CMP Latest Ref Rng 06/15/2015 06/14/2015 05/31/2015  Glucose 65 - 99 mg/dL 138(H) 153(H) 156(H)  BUN 6 - 20 mg/dL 6 6.0(L) 9.0  Creatinine 0.44 - 1.00 mg/dL 0.54 0.7 0.8  Sodium 135 - 145 mmol/L 134(L) 138 140  Potassium 3.5 - 5.1 mmol/L 3.7 3.6 3.7  Chloride 101 - 111 mmol/L 102 - -  CO2 22 - 32 mmol/L 23 26 27   Calcium 8.9 - 10.3 mg/dL 7.7(L) 8.3(L) 9.2  Total Protein 6.4 - 8.3 g/dL - 5.8(L) 7.0  Total Bilirubin 0.20 - 1.20 mg/dL - 0.59 0.75  Alkaline Phos 40 - 150 U/L - 76 77  AST 5 - 34 U/L - 18 19  ALT 0 - 55 U/L - 13 15   RADIOGRAPHIC STUDIES: I have personally reviewed the radiological images as listed and agreed with the findings in the report. Ct Abdomen Pelvis W Contrast  06/14/2015  CLINICAL DATA:  Nausea and vomiting. Concern for gastric outlet obstruction. History of cholangiocarcinoma, completed chemotherapy. EXAM: CT ABDOMEN AND PELVIS WITH CONTRAST TECHNIQUE: Multidetector CT imaging of the  abdomen and pelvis was performed using the standard protocol following bolus administration of intravenous contrast. CONTRAST:  133m OMNIPAQUE IOHEXOL 300 MG/ML  SOLN COMPARISON:  05/23/2015 FINDINGS: Atelectasis in the lung bases.  Coronary artery calcifications. Postoperative changes with left hepatectomy. Surgical clips along the surgical edge of the liver. Mild edema adjacent to the anastomosis. Gallbladder is surgically absent. Bile ducts are not dilated. The spleen size is normal. Small accessory spleen. Tiny sub cm low-attenuation lesion in the upper spleen likely represents a small cyst or hemangioma. The lesion has been present on previous studies dating back to 03/01/2013 without significant interval change. Pancreas, adrenal glands, kidneys, abdominal aorta, and inferior vena cava are unremarkable. Stomach and duodenum demonstrate wall thickening possibly indicating gastritis and duodenitis. There is no gastric distention and contrast material passes through the stomach into the small bowel and colon. No evidence of obstruction. Small bowel and colon are not abnormally distended. No free air or free fluid in the abdomen. Surgical scarring in the anterior abdominal wall with small midline hernia containing fat. Pelvis: Appendix is normal. Bladder wall is not thickened. Uterus is surgically absent. No free or loculated pelvic fluid collections. No pelvic mass or lymphadenopathy. Degenerative changes in the spine and hips. No destructive bone lesions. IMPRESSION: Previous left hepatectomy. Wall thickening in the stomach and proximal duodenum may indicate gastritis and duodenitis or peptic ulcer disease. No evidence of outlet obstruction. Electronically Signed   By: WLucienne CapersM.D.   On: 06/14/2015 22:11   Ct Abdomen Pelvis W Contrast  05/23/2015  CLINICAL DATA:  Nausea, intractable vomiting. EXAM: CT ABDOMEN AND PELVIS WITH CONTRAST TECHNIQUE: Multidetector CT imaging of the abdomen and pelvis was  performed using the standard protocol following bolus administration of intravenous contrast. CONTRAST:  1036mOMNIPAQUE IOHEXOL 300 MG/ML  SOLN COMPARISON:  MRI 09/15/2014 FINDINGS: Linear subsegmental atelectasis in the lung bases. Heart is mildly enlarged. No effusions. Changes of partial left hepatectomy. No focal abnormality in the remaining liver. Spleen, pancreas, adrenals  and kidneys are unremarkable. Prior cholecystectomy. There is wall thickening within the gastric antrum and pylorus, possibly related to gastritis. No evidence of gastric outlet obstruction. Small bowel and large bowel are decompressed and unremarkable. No free fluid, free air or adenopathy. Aorta is normal caliber. Prior hysterectomy. No adnexal masses. Urinary bladder is unremarkable. Appendix is visualized and is normal. Diffuse degenerative changes throughout the lumbar spine. No acute bony abnormality. IMPRESSION: Partial left hepatectomy. Circumferential wall thickening in the gastric antrum and pylorus regions. This could reflect gastritis. Prior hysterectomy and cholecystectomy. Electronically Signed   By: Rolm Baptise M.D.   On: 05/23/2015 14:58     ASSESSMENT & PLAN:   70 yo with multiple medical co-morbids  1) Intrahepatic invasive cholangiocarcinoma T2a N1 M0 (Stage IVA) with focal positive parenchymal margins and 1/2 LN positive. CT C/A/P on 04/03/2016 showed no evidence of cholangiocarcinoma local recurrence or metastases at this time. Patient  has completed her adjuvant concurrent chemo-radiation  on 05/25/2015 . She did overall quite well with treatment without any overt prohibitive immediate toxicities but appears to be having symptoms from radiation gastritis causing some pylori stenosis.  2) grade 2 fatigue -Improving though currently notes that she feels somewhat weak likely due to dehydration .  3) Mild thrombocytopenia PLT 94K likely due to chemotherapy.  4) intractable nausea and some vomiting likely due  to radiation gastritis. Patient was seen in the emergency room on 05/23/2015 and had a CT abdomen for this which showed circumferential wall thickening in the gastric antrum and pylorus regions likely from radiation gastritis.  She was seen medicine clinic on 05/31/2015 and received IV fluids and much of different IV antinausea medications with relief. However notes that she has been persistently nauseous and vomiting every day for the last 2 weeks.   Patient notes that her symptoms are much better controlled currently. CT of the abdomen and pelvis did not show overt gastric Dr. obstruction but showed thickening in the stomach and duodenum suggestive of radiation gastritis and duodenitis. Had an EGD done today which showed severe radiation gastritis with some mild pyloric stenosis which likely explains her symptoms. Plan  -Appreciate the excellent help from the hospitalist team and gastroenterology. -Continue IV PPI twice a day and we'll transition to oral PPI twice a day on discharge. -Continue IV dexamethasone 4 mg daily would transition to prednisone 40 mg by mouth daily on discharge and taper by 5 mg every 3 days. -Liquid sucralfate 3 times a day before meals and at bedtime. On discharge would need to be careful not to take this with her other medications especially the PPIs. --IV fluids -Repeat CT abdom -Started on clear liquid diets as per GI recommendations. -We will need to be predominantly liquid and soft solid foods until her symptoms improve. Continue scopolamine patch  5)  oropharyngeal thrush  -Status post oral nystatin swish and swallow for 2 weeks. -If this returns with steroids would treat with fluconazole.  Dispo - when patient able to tolerate oral food intake without ongoing nausea and vomiting .   I spent 25 minutes counseling the patient face to face. The total time spent in the appointment was 30 minutes and more than 50% was on counseling and direct patient cares in  coordination of care with the hospitalist team   Sullivan Lone MD Leon AAHIVMS [facility] Hematology/Oncology Physician [place]  (Office):       [phone] (Work cell):  [phone] (Fax):  [phone]  

## 2015-06-15 NOTE — Progress Notes (Signed)
PROGRESS NOTE    Bonnie Peterson  NGE:[id number]  DOB: [birth year]  DOA: 06/14/2015 PCP: Paula Compton, MD Outpatient Specialists: Outpatient Specialists:  1. Medical Oncology: Dr. Brunetta Genera 2. GI: Dr. Owens Loffler.   Hospital course: 70 y.o. female , single, lives alone and is independent of activities of daily living, PMH of intrahepatic invasive cholangiocarcinoma who completed adjuvant concurrent chemoradiation on 05/25/15 and CT chest, abdomen and pelvis on 04/03/16 showed no evidence of cholangiocarcinoma local recurrence or metastasis, DM 2, HTN, HLD, anxiety, depression, OSA, COPD, was sent from oncologist's office to medical floor as directed admission for evaluation and management of intractable nausea and vomiting. CT abdomen and pelvis ruled out gastric outlet obstruction but showed wall thickening in the stomach, proximal duodenum which may indicate gastritis, duodenitis or PUD. [facility] consulted and plan EGD 3/9.   Assessment & Plan:   Intractable nausea and some vomiting - Concern was for gastric outlet obstruction but was ruled out by CT of abdomen and pelvis. - Treated supportively with clear liquids, IV Protonix, IV Decadron 4 MG daily, when necessary IV Zofran and Phenergan. Gentle IV fluids. Continue scopolamine patch. - CT abdomen shows thickening of stomach, duodenum and suggestion of possible gastritis, duodenitis or PUD. - Due to prolonged nature of her ongoing complaints and abnormal CT findings, [facility] consulted and plan EGD this afternoon. Await further recommendations.  Dehydration - Secondary to poor oral intake and GI losses. IV fluids. Improved. Continue gentle fluids for additional 24 hours.  Anemia - Secondary to chronic disease - Follow CBCs. Relatively stable.  Thrombocytopenia - Likely from chemotherapy. No bleeding reported. Stable.  Intrahepatic invasive cholangiocarcinoma - Status post adjuvant concurrent chemoradiation  without recurrence at this time. Follow with oncology as outpatient.  Type II DM - Hold oral medications. SSI. Controlled.  Essential hypertension - Controlled off of medications. Monitor for now.  Marijuana use - For nausea and improved appetite per patient  COPD - Stable  Anxiety & depression - Stable without suicidal or homicidal ideations or audiovisual hallucinations. He was not on medications PTA.    DVT Prophylaxis: Lovenox Code Status: Full  Family Communication: None at bedside today..  Disposition Plan: DC home when medically stable, possibly in 1-2 days.     Consultants:  [facility]  Procedures:  None  Antimicrobials:  None   Subjective: Did not have anything to eat or drink until this morning. Had clear liquids this morning without any worsening abdominal pain and has not had nausea or vomiting thus far. Had normal BM this morning. Chronic mid upper abdominal pain-intermittent and mild.  Objective: Filed Vitals:   06/14/15 1700 06/14/15 2116 06/15/15 0426  BP: 122/70 121/55 119/70  Pulse: 71 71 81  Temp: 98 F (36.7 C) 98.3 F (36.8 C) 99.6 F (37.6 C)  TempSrc: Oral Oral Oral  Resp: 16 14 14   Height: 5' 3"  (1.6 m)    Weight: 85.73 kg (189 lb)  85.186 kg (187 lb 12.8 oz)  SpO2: 100% 97% 94%   No intake or output data in the 24 hours ending 06/15/15 1236 Filed Weights   06/14/15 1700 06/15/15 0426  Weight: 85.73 kg (189 lb) 85.186 kg (187 lb 12.8 oz)    Exam:  General exam: Pleasant middle-aged female lying comfortably in bed Respiratory system: Clear. No increased work of breathing. Cardiovascular system: S1 & S2 heard, RRR. No JVD, murmurs, gallops, clicks or pedal edema. Gastrointestinal system: Abdomen is nondistended, soft and mild  epigastric tenderness without peritoneal signs. Normal bowel sounds heard. Healed upper abdominal laparotomy scar. Central nervous system: Alert and oriented. No focal neurological  deficits. Extremities: Symmetric 5 x 5 power.   Data Reviewed: Basic Metabolic Panel:  Recent Labs Lab 06/14/15 1256 06/15/15 0328  NA 138 134*  K 3.6 3.7  CL  --  102  CO2 26 23  GLUCOSE 153* 138*  BUN 6.0* 6  CREATININE 0.7 0.54  CALCIUM 8.3* 7.7*   Liver Function Tests:  Recent Labs Lab 06/14/15 1256  AST 18  ALT 13  ALKPHOS 76  BILITOT 0.59  PROT 5.8*  ALBUMIN 2.7*   No results for input(s): LIPASE, AMYLASE in the last 168 hours. No results for input(s): AMMONIA in the last 168 hours. CBC:  Recent Labs Lab 06/14/15 1256 06/15/15 0328  WBC 9.1 6.3  NEUTROABS 3.9  --   HGB 11.5* 10.3*  HCT 35.5 32.3*  MCV 89.9 91.8  PLT 92* 94*   Cardiac Enzymes: No results for input(s): CKTOTAL, CKMB, CKMBINDEX, TROPONINI in the last 168 hours. BNP (last 3 results) No results for input(s): PROBNP in the last 8760 hours. CBG:  Recent Labs Lab 06/14/15 1811 06/14/15 2119 06/15/15 0802 06/15/15 1206  GLUCAP 99 109* 173* 95    No results found for this or any previous visit (from the past 240 hour(s)).       Studies: Ct Abdomen Pelvis W Contrast  06/14/2015  CLINICAL DATA:  Nausea and vomiting. Concern for gastric outlet obstruction. History of cholangiocarcinoma, completed chemotherapy. EXAM: CT ABDOMEN AND PELVIS WITH CONTRAST TECHNIQUE: Multidetector CT imaging of the abdomen and pelvis was performed using the standard protocol following bolus administration of intravenous contrast. CONTRAST:  186m OMNIPAQUE IOHEXOL 300 MG/ML  SOLN COMPARISON:  05/23/2015 FINDINGS: Atelectasis in the lung bases.  Coronary artery calcifications. Postoperative changes with left hepatectomy. Surgical clips along the surgical edge of the liver. Mild edema adjacent to the anastomosis. Gallbladder is surgically absent. Bile ducts are not dilated. The spleen size is normal. Small accessory spleen. Tiny sub cm low-attenuation lesion in the upper spleen likely represents a small cyst or  hemangioma. The lesion has been present on previous studies dating back to 03/01/2013 without significant interval change. Pancreas, adrenal glands, kidneys, abdominal aorta, and inferior vena cava are unremarkable. Stomach and duodenum demonstrate wall thickening possibly indicating gastritis and duodenitis. There is no gastric distention and contrast material passes through the stomach into the small bowel and colon. No evidence of obstruction. Small bowel and colon are not abnormally distended. No free air or free fluid in the abdomen. Surgical scarring in the anterior abdominal wall with small midline hernia containing fat. Pelvis: Appendix is normal. Bladder wall is not thickened. Uterus is surgically absent. No free or loculated pelvic fluid collections. No pelvic mass or lymphadenopathy. Degenerative changes in the spine and hips. No destructive bone lesions. IMPRESSION: Previous left hepatectomy. Wall thickening in the stomach and proximal duodenum may indicate gastritis and duodenitis or peptic ulcer disease. No evidence of outlet obstruction. Electronically Signed   By: WLucienne CapersM.D.   On: 06/14/2015 22:11        Scheduled Meds: . dexamethasone  4 mg Intravenous Q24H  . enoxaparin (LOVENOX) injection  40 mg Subcutaneous Q24H  . insulin aspart  0-9 Units Subcutaneous TID WC  . pantoprazole (PROTONIX) IV  40 mg Intravenous Q24H  . [START ON 06/16/2015] scopolamine  1 patch Transdermal Q72H   Continuous Infusions: . sodium chloride    .  0.9 % NaCl with KCl 20 mEq / L 75 mL/hr at 06/15/15 1041    Active Problems:   Diabetes mellitus, type 2 (HCC)   Major depressive disorder, recurrent episode (HCC)   HYPERTENSION, BENIGN SYSTEMIC   COPD (chronic obstructive pulmonary disease) (HCC)   Radiation gastritis   Dehydration   Intractable nausea and vomiting    Time spent: 20 minutes.    Vernell Leep, MD, FACP, FHM. [facility] Pager [phone]  If 7PM-7AM,  please contact night-coverage [email or web address] Password [email or web address] 06/15/2015, 12:36 PM    LOS: 1 day

## 2015-06-15 NOTE — Op Note (Signed)
[facility] Patient Name: Bonnie Peterson Procedure Date: 06/15/2015 MRN: [id number] Attending MD: Gatha Mayer , MD Date of Birth: Feb 12, 1946 CSN:  Age: 70 Admit Type: Inpatient Account #: [id number] Procedure:                Upper GI endoscopy Indications:              Epigastric abdominal pain, Persistent vomiting Providers:                Gatha Mayer, MD, Laverta Baltimore, RN, Cherylynn Ridges, Technician, Ralene Bathe Referring MD:              Medicines:                Fentanyl 50 micrograms IV, Midazolam 5 mg IV Complications:            No immediate complications. Estimated Blood Loss:     Estimated blood loss: none. Procedure:                Pre-Anesthesia Assessment:                           - Prior to the procedure, a History and Physical                            was performed, and patient medications and                            allergies were reviewed. The patient's tolerance of                            previous anesthesia was also reviewed. The risks                            and benefits of the procedure and the sedation                            options and risks were discussed with the patient.                            All questions were answered, and informed consent                            was obtained. Prior Anticoagulants: The patient                            last took Lovenox (enoxaparin) 1 day prior to the                            procedure. ASA Grade Assessment: III - A patient                            with severe systemic disease. After reviewing the  risks and benefits, the patient was deemed in                            satisfactory condition to undergo the procedure.                           After obtaining informed consent, the endoscope was                            passed under direct vision. Throughout the                            procedure, the patient's blood  pressure, pulse, and                            oxygen saturations were monitored continuously. The                            EG-[id number] ([id number]) scope was introduced through the                            mouth, and advanced to the second part of duodenum.                            The upper GI endoscopy was accomplished without                            difficulty. The patient tolerated the procedure                            fairly well. Scope In: Scope Out: Findings:      Diffuse severe inflammation characterized by erosions, erythema and       friability was found in the gastric antrum. Biopsies were taken with a       cold forceps for histology.      A benign-appearing, intrinsic moderate stenosis was found at the       pylorus. This was traversed. Edema from gastritis present and cause.      The exam was otherwise without abnormality. Impression:               - Acute gastritis. Biopsied. 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 Edema                            from inflammation.                           - The examination was otherwise normal. Moderate Sedation:      Moderate (conscious) sedation was administered by the endoscopy nurse       and supervised by the endoscopist.  The following parameters were       monitored: oxygen saturation, heart rate, blood pressure, respiratory       rate, EKG, adequacy of pulmonary ventilation, and response to care.       Total physician intraservice time was 10 minutes. Recommendation:           - Return patient to hospital ward for ongoing care.                           - Clear liquid diet.                           - Continue present medications.                           - Await pathology results.                           - resume sucralfate qid                           make PPI q 12 hrs                           avoid Regan for now due to  edema at pylorus may try                            later                           avoid alendronate (she has not been taking x 1                            month) Procedure Code(s):        --- Professional ---                           [phone], Esophagogastroduodenoscopy, flexible,                            transoral; with biopsy, single or multiple                           G0500, Moderate sedation services provided by the                            same physician or other qualified health care                            professional performing a gastrointestinal                            endoscopic service that sedation supports,                            requiring the presence of an independent trained  observer to assist in the monitoring of the                            patient's level of consciousness and physiological                            status; initial 15 minutes of intra-service time;                            patient age 55 years or older (additional time may                            be reported with [phone], as appropriate) Diagnosis Code(s):        --- Professional ---                           K29.00, Acute gastritis without bleeding                           K31.1, Adult hypertrophic pyloric stenosis                           R10.13, Epigastric pain                           R11.10, Vomiting, unspecified CPT copyright 2016 [employer]. All rights reserved. The codes documented in this report are preliminary and upon coder review may  be revised to meet current compliance requirements. Gatha Mayer, MD Gatha Mayer, MD 06/15/2015 3:17:05 PM Number of Addenda: 0

## 2015-06-16 DIAGNOSIS — K311 Adult hypertrophic pyloric stenosis: Secondary | ICD-10-CM

## 2015-06-16 LAB — CBC
HCT: 32.7 % — ABNORMAL LOW (ref 36.0–46.0)
Hemoglobin: 10.6 g/dL — ABNORMAL LOW (ref 12.0–15.0)
MCH: 28.6 pg (ref 26.0–34.0)
MCHC: 32.4 g/dL (ref 30.0–36.0)
MCV: 88.4 fL (ref 78.0–100.0)
Platelets: 103 10*3/uL — ABNORMAL LOW (ref 150–400)
RBC: 3.7 MIL/uL — ABNORMAL LOW (ref 3.87–5.11)
RDW: 18.6 % — ABNORMAL HIGH (ref 11.5–15.5)
WBC: 5.3 10*3/uL (ref 4.0–10.5)

## 2015-06-16 LAB — GLUCOSE, CAPILLARY
Glucose-Capillary: 163 mg/dL — ABNORMAL HIGH (ref 65–99)
Glucose-Capillary: 131 mg/dL — ABNORMAL HIGH (ref 65–99)

## 2015-06-16 MED ORDER — NYSTATIN 100000 UNIT/ML MT SUSP: 5.0000 mL | Freq: Four times a day (QID) | OROMUCOSAL | Status: AC

## 2015-06-16 MED ORDER — PREDNISONE 10 MG PO TABS: 40.0000 mg | ORAL_TABLET | Freq: Every day | ORAL | Status: DC

## 2015-06-16 MED ORDER — PANTOPRAZOLE SODIUM 40 MG PO TBEC: 40.0000 mg | DELAYED_RELEASE_TABLET | Freq: Two times a day (BID) | ORAL | Status: DC

## 2015-06-16 MED ORDER — METFORMIN HCL 1000 MG PO TABS: 500.0000 mg | ORAL_TABLET | Freq: Two times a day (BID) | ORAL | Status: DC

## 2015-06-16 MED ORDER — SUCRALFATE 1 GM/10ML PO SUSP: 1.0000 g | Freq: Three times a day (TID) | ORAL | Status: DC

## 2015-06-16 MED FILL — CARAFATE 1 GM/10 ML SUSP: 1 | 10 days supply | Qty: 420 | Fill #0

## 2015-06-16 MED FILL — PANTOPRAZOLE SOD DR 40 MG T: 40 | 30 days supply | Qty: 60 | Fill #0

## 2015-06-16 MED FILL — NYSTATIN 100,000 UNITS/ML S: 100000 | 14 days supply | Qty: 280 | Fill #0

## 2015-06-16 MED FILL — predniSONE 10 MG TABS: 10 | 24 days supply | Qty: 54 | Fill #0

## 2015-06-16 NOTE — Progress Notes (Signed)
Patient's discharge instructions rendered, all questions answered appropriately. Care notes re: full liquids provided, patient verbalized understanding and also instructed to take sucralfate 2 hours before other medicines, patient understood. Patient denied n/v and pain. Tolerated present diet. Awaiting for daughter to come transport her home.

## 2015-06-16 NOTE — Discharge Instructions (Signed)
Gastritis, Adult Gastritis is soreness and swelling (inflammation) of the lining of the stomach. Gastritis can develop as a sudden onset (acute) or long-term (chronic) condition. If gastritis is not treated, it can lead to stomach bleeding and ulcers. CAUSES  Gastritis occurs when the stomach lining is weak or damaged. Digestive juices from the stomach then inflame the weakened stomach lining. The stomach lining may be weak or damaged due to viral or bacterial infections. One common bacterial infection is the Helicobacter pylori infection. Gastritis can also result from excessive alcohol consumption, taking certain medicines, or having too much acid in the stomach.  SYMPTOMS  In some cases, there are no symptoms. When symptoms are present, they may include:  Pain or a burning sensation in the upper abdomen.  Nausea.  Vomiting.  An uncomfortable feeling of fullness after eating. DIAGNOSIS  Your caregiver may suspect you have gastritis based on your symptoms and a physical exam. To determine the cause of your gastritis, your caregiver may perform the following:  Blood or stool tests to check for the H pylori bacterium.  Gastroscopy. A thin, flexible tube (endoscope) is passed down the esophagus and into the stomach. The endoscope has a light and camera on the end. Your caregiver uses the endoscope to view the inside of the stomach.  Taking a tissue sample (biopsy) from the stomach to examine under a microscope. TREATMENT  Depending on the cause of your gastritis, medicines may be prescribed. If you have a bacterial infection, such as an H pylori infection, antibiotics may be given. If your gastritis is caused by too much acid in the stomach, H2 blockers or antacids may be given. Your caregiver may recommend that you stop taking aspirin, ibuprofen, or other nonsteroidal anti-inflammatory drugs (NSAIDs). HOME CARE INSTRUCTIONS  Only take over-the-counter or prescription medicines as directed by  your caregiver.  If you were given antibiotic medicines, take them as directed. Finish them even if you start to feel better.  Drink enough fluids to keep your urine clear or pale yellow.  Avoid foods and drinks that make your symptoms worse, such as:  Caffeine or alcoholic drinks.  Chocolate.  Peppermint or mint flavorings.  Garlic and onions.  Spicy foods.  Citrus fruits, such as oranges, lemons, or limes.  Tomato-based foods such as sauce, chili, salsa, and pizza.  Fried and fatty foods.  Eat small, frequent meals instead of large meals. SEEK IMMEDIATE MEDICAL CARE IF:   You have black or dark red stools.  You vomit blood or material that looks like coffee grounds.  You are unable to keep fluids down.  Your abdominal pain gets worse.  You have a fever.  You do not feel better after 1 week.  You have any other questions or concerns. MAKE SURE YOU:  Understand these instructions.  Will watch your condition.  Will get help right away if you are not doing well or get worse.   This information is not intended to replace advice given to you by your health care provider. Make sure you discuss any questions you have with your health care provider.   Document Released: 03/19/2001 Document Revised: 09/24/2011 Document Reviewed: 05/08/2011 Elsevier Interactive Patient Education 2016 [employer]  Full Liquid Diet A full liquid diet may be used:   To help you transition from a clear liquid diet to a soft diet.   When your body is healing and can only tolerate foods that are easy to digest.  Before or after certain a procedure,  test, or surgery (such as stomach or intestinal surgeries).   If you have trouble swallowing or chewing.  A full liquid diet includes fluids and foods that are liquid or will become liquid at room temperature. The full liquid diet gives you the proteins, fluids, salts, and minerals that you need for energy. If you continue this diet  for more than 72 hours, talk to your health care provider about how many calories you need to consume. If you continue the diet for more than 5 days, talk to your health care provider about taking a multivitamin or a nutritional supplement. WHAT DO I NEED TO KNOW ABOUT A FULL LIQUID DIET?  You may have any liquid.  You may have any food that becomes a liquid at room temperature. The food is considered a liquid if it can be poured off a spoon at room temperature.  Drink one serving of citrus or vitamin C-enriched fruit juice daily. WHAT FOODS CAN I EAT? Grains Any grain food that can be pureed in soup (such as crackers, pasta, and rice). Hot cereal (such as farina or oatmeal) that has been blended. Talk to your health care provider or dietitian about these foods. Vegetables Pulp-free tomato or vegetable juice. Vegetables pureed in soup.  Fruits Fruit juice, including nectars and juices with pulp. Meats and Other Protein Sources Eggs in custard, eggnog mix, and eggs used in ice cream or pudding. Strained meats, like in baby food, may be allowed. Consult your health care provider.  Dairy Milk and milk-based beverages, including milk shakes and instant breakfast mixes. Smooth yogurt. Pureed cottage cheese. Avoid these foods if they are not well tolerated. Beverages All beverages, including liquid nutritional supplements. Ask your health care provider if you can have carbonated beverages. They may not be well tolerated. Condiments Iodized salt, pepper, spices, and flavorings. Cocoa powder. Vinegar, ketchup, yellow mustard, smooth sauces (such as hollandaise, cheese sauce, or white sauce), and soy sauce. Sweets and Desserts Custard, smooth pudding. Flavored gelatin. Tapioca, junket. Plain ice cream, sherbet, fruit ices. Frozen ice pops, frozen fudge pops, pudding pops, and other frozen bars with cream. Syrups, including chocolate syrup. Sugar, honey, jelly.  Fats and Oils Margarine, butter,  cream, sour cream, and oils. Other Broth and cream soups. Strained, broth-based soups. The items listed above may not be a complete list of recommended foods or beverages. Contact your dietitian for more options.  WHAT FOODS CAN I NOT EAT? Grains All breads. Grains are not allowed unless they are pureed into soup. Vegetables Vegetables are not allowed unless they are juiced, or cooked and pureed into soup. Fruits Fruits are not allowed unless they are juiced. Meats and Other Protein Sources Any meat or fish. Cooked or raw eggs. Nut butters.  Dairy Cheese.  Condiments Stone ground mustards. Fats and Oils Fats that are coarse or chunky. Sweets and Desserts Ice cream or other frozen desserts that have any solids in them or on top, such as nuts, chocolate chips, and pieces of cookies. Cakes. Cookies. Candy. Others Soups with chunks or pieces in them. The items listed above may not be a complete list of foods and beverages to avoid. Contact your dietitian for more information.   This information is not intended to replace advice given to you by your health care provider. Make sure you discuss any questions you have with your health care provider.   Document Released: 03/25/2005 Document Revised: 03/30/2013 Document Reviewed: 01/28/2013 Elsevier Interactive Patient Education [employer].

## 2015-06-16 NOTE — Discharge Summary (Signed)
Physician Discharge Summary  Bonnie Peterson  ZDG:[id number]  DOB: 04/20/1945  DOA: 06/14/2015  PCP: Paula Compton, MD  Outpatient specialists: Primary Gastroenterologist: Dr. Owens Loffler Primary Oncologist: Dr. Irene Limbo  Admit date: 06/14/2015 Discharge date: 06/16/2015  Time spent: Greater than 30 minutes  Recommendations for Outpatient Follow-up:  1. Dr. Owens Loffler, GI: MDs office will call patient with follow-up appointment 2. Dr. Paula Compton, PCP in 5 days with repeat labs (CBC & BMP 3. Dr. Sullivan Lone, Oncology  Discharge Diagnoses:  Active Problems:   Diabetes mellitus, type 2 (HCC)   Major depressive disorder, recurrent episode (HCC)   HYPERTENSION, BENIGN SYSTEMIC   COPD (chronic obstructive pulmonary disease) (HCC)   Radiation gastritis   Dehydration   Intractable nausea and vomiting   Discharge Condition: Improved & Stable  Diet recommendation: Full liquid diet.  Filed Weights   06/14/15 1700 06/15/15 0426  Weight: 85.73 kg (189 lb) 85.186 kg (187 lb 12.8 oz)    History of present illness:  70 y.o. female , single, lives alone and is independent of activities of daily living, PMH of intrahepatic invasive cholangiocarcinoma who completed adjuvant concurrent chemoradiation on 05/25/15 and CT chest, abdomen and pelvis on 04/03/16 showed no evidence of cholangiocarcinoma local recurrence or metastasis, DM 2, HTN, HLD, anxiety, depression, OSA, COPD, was sent from oncologist's office to medical floor as directed admission for evaluation and management of intractable nausea and vomiting. CT abdomen and pelvis ruled out gastric outlet obstruction but showed wall thickening in the stomach, proximal duodenum which may indicate gastritis, duodenitis or PUD.   Hospital Course:   Intractable nausea and some vomiting secondary to radiation gastritis and mild pyloric stenosis. - Concern was for gastric outlet obstruction but was ruled out by CT of abdomen and pelvis. -  Treated supportively with clear liquids, IV Protonix, IV Decadron 4 MG daily, when necessary IV Zofran and Phenergan. Gentle IV fluids. Continue scopolamine patch. - CT abdomen shows thickening of stomach, duodenum and suggestion of possible gastritis, duodenitis or PUD. - Due to prolonged nature of her ongoing complaints and abnormal CT findings, [facility] consulted and performed EGD which essentially showed severe radiation gastritis with some mild pyloric stenosis. - Patient has tolerated liquid diet. GI has seen today and have cleared her for discharge with following recommendations: Carafate (patient has been counseled regarding not taking these medications along with other medications), twice a day PPI, full liquid diet which can be advanced as outpatient upon follow-up based on how she does. - DC Bisphosphonate for now which can be resumed in the future based on her improvement. No Reglan per GI recommendations. - As per oncologist, change steroids to oral prednisone with slow taper.  Dehydration - Resolved after IV fluids.  Anemia - Secondary to chronic disease - Relatively stable. - Outpatient follow-up with repeat labs.  Thrombocytopenia - Likely from chemotherapy. No bleeding reported. Stable.  Intrahepatic invasive cholangiocarcinoma - Status post adjuvant concurrent chemoradiation without recurrence at this time. Follow with oncology as outpatient. Oncology follow-up appreciated and plan to repeat CT as outpatient.  Type II DM - Held oral medications. SSI. Controlled. Patient is to resume metformin on 06/17/15 and has been advised to do so. Metformin held until then due to contrast she received for CT on 06/14/15.  Essential hypertension - Controlled off of medications. Monitor for now. Discharged on no antihypertensives. This can be reviewed during outpatient follow-up with PCP.  Marijuana use - For nausea and improved appetite per patient  COPD - Stable  Anxiety &  depression - Stable without suicidal or homicidal ideations or audiovisual hallucinations. He was not on medications PTA.  Oral thrush - Continue nystatin swish and swallow 2 weeks.     Consultants:  [facility]  Procedures:  EGD 06/25/15: Impression: - Acute gastritis. Biopsied. Radiation gastritis   suspected  - Gastric stenosis was found at the pylorus. Edema   from inflammation.  - The examination was otherwise normal.  Recommendation: - Return patient to hospital ward for ongoing care.  - Clear liquid diet.  - Continue present medications.  - Await pathology results.  - resume sucralfate qid  make PPI q 12 hrs  avoid Regan for now due to edema at pylorus may try   later  avoid alendronate (she has not been taking x 1   month)  Antimicrobials:  None   Discharge Exam:  Complaints: Tolerating liquid diet. No worsening abdominal pain. No nausea or vomiting.  Filed Vitals:   06/15/15 1600 06/15/15 2044 06/15/15 2130 06/16/15 0536  BP: 140/61 125/55  153/60  Pulse: 67 64  67  Temp: 98.9 F (37.2 C) 97.7 F (36.5 C) 97.3 F (36.3 C) 97.9 F (36.6 C)  TempSrc:  Oral Oral Oral  Resp: 20 20  22   Height:      Weight:      SpO2: 96% 97%  100%    General exam: Pleasant middle-aged female sitting up comfortably in chair this morning. Respiratory system: Clear. No increased work of breathing. Cardiovascular system: S1 & S2 heard, RRR. No JVD, murmurs, gallops, clicks or pedal edema. Gastrointestinal system: Abdomen is nondistended, soft and nontender. Normal bowel sounds heard. Healed upper abdominal laparotomy  scar. Central nervous system: Alert and oriented. No focal neurological deficits. Extremities: Symmetric 5 x 5 power.  Discharge Instructions      Discharge Instructions    Call MD for:  difficulty breathing, headache or visual disturbances    Complete by:  As directed      Call MD for:  extreme fatigue    Complete by:  As directed      Call MD for:  persistant dizziness or light-headedness    Complete by:  As directed      Call MD for:  persistant nausea and vomiting    Complete by:  As directed      Call MD for:  severe uncontrolled pain    Complete by:  As directed      Call MD for:  temperature >100.4    Complete by:  As directed      Discharge instructions    Complete by:  As directed   DIET: Full liquids as per instructions provided. Follow-up with outpatient MDs regarding advancing diet.     Increase activity slowly    Complete by:  As directed             Medication List    STOP taking these medications        alendronate 70 MG tablet  Commonly known as:  FOSAMAX     amLODipine 10 MG tablet  Commonly known as:  NORVASC     atenolol 50 MG tablet  Commonly known as:  TENORMIN     baclofen 10 MG tablet  Commonly known as:  LIORESAL     dexamethasone 4 MG tablet  Commonly known as:  DECADRON     enalapril 20 MG tablet  Commonly known as:  VASOTEC     hydrochlorothiazide  25 MG tablet  Commonly known as:  HYDRODIURIL     metoCLOPramide 10 MG tablet  Commonly known as:  REGLAN     ondansetron 8 MG disintegrating tablet  Commonly known as:  ZOFRAN ODT     PNEUMOVAX 23 IJ      TAKE these medications        albuterol 108 (90 Base) MCG/ACT inhaler  Commonly known as:  PROAIR HFA  INHALE 2 PUFFS INTO THE LUNGS EVERY 4 HOURS AS NEEDED FOR WHEEZING OR SHORTNESS OF BREATH     cholecalciferol 1000 units tablet  Commonly known as:  VITAMIN D  Take 1 tablet (1,000 Units total) by mouth daily.     CLEAR EYES COMPLETE OP  Place 1 drop into both eyes  daily as needed (dry eyes).     colchicine 0.6 MG tablet  TAKE 1 TABLET DAILY AS NEEDED FOR GOUT FLARE     diclofenac sodium 1 % Gel  Commonly known as:  VOLTAREN  Apply 2 g topically 4 (four) times daily as needed.     fish oil-omega-3 fatty acids 1000 MG capsule  Take 1 g by mouth every 30 (thirty) days.     fluticasone 50 MCG/ACT nasal spray  Commonly known as:  FLONASE  Place 1 spray into the nose daily as needed for rhinitis.     loratadine 10 MG tablet  Commonly known as:  CLARITIN  Take 1 tablet (10 mg total) by mouth daily as needed for allergies.     metFORMIN 1000 MG tablet  Commonly known as:  GLUCOPHAGE  Take 0.5-1 tablets (500-1,000 mg total) by mouth 2 (two) times daily with a meal. Take 558m each morning, 10045meach night  Start taking on:  06/17/2015     multivitamin with minerals Tabs tablet  Take 1 tablet by mouth daily.     nystatin 100000 UNIT/ML suspension  Commonly known as:  MYCOSTATIN  Take 5 mLs (500,000 Units total) by mouth 4 (four) times daily. Swish and swallow.     ondansetron 8 MG tablet  Commonly known as:  ZOFRAN  Take 1 tablet (8 mg total) by mouth every 8 (eight) hours as needed for nausea or vomiting.     pantoprazole 40 MG tablet  Commonly known as:  PROTONIX  Take 1 tablet (40 mg total) by mouth 2 (two) times daily before a meal.     predniSONE 10 MG tablet  Commonly known as:  DELTASONE  Take 4 tablets (40 mg total) by mouth daily with breakfast. Taper down by 5 mg every 3 days to discontinue.     scopolamine 1 MG/3DAYS  Commonly known as:  TRANSDERM-SCOP  Place 1 patch (1.5 mg total) onto the skin every 3 (three) days.     sucralfate 1 GM/10ML suspension  Commonly known as:  CARAFATE  Take 10 mLs (1 g total) by mouth 4 (four) times daily -  before meals and at bedtime.     Tdap 5-2.5-18.5 LF-MCG/0.5 injection  Commonly known as:  BOOSTRIX  Inject 0.5 mLs into the muscle once.     tiotropium 18 MCG inhalation capsule   Commonly known as:  SPIRIVA  Place 1 capsule (18 mcg total) into inhaler and inhale as needed (if its hot outside).     traMADol 50 MG tablet  Commonly known as:  ULTRAM  Take 1 tablet (50 mg total) by mouth every 6 (six) hours as needed for moderate pain or severe pain.  VITAMIN E PO  Take 1 tablet by mouth daily.       Follow-up Information    Follow up with [name], MD.   Specialty:  Gastroenterology   Why:  office will call you with a follow up appt.   Contact information:   [address] [place] [address] [phone]       Follow up with Paula Compton, MD. Schedule an appointment as soon as possible for a visit in 5 days.   Specialty:  Family Medicine   Why:  To be seen with repeat labs (CBC & BMP).   Contact information:   [address] [place] [address] [phone]       Schedule an appointment as soon as possible for a visit with Sullivan Lone, MD.   Specialties:  Hematology, Oncology   Contact information:   [place] [address] [phone]       Get Medicines reviewed and adjusted: Please take all your medications with you for your next visit with your Primary MD  Please request your Primary MD to go over all hospital tests and procedure/radiological results at the follow up. Please ask your Primary MD to get all Hospital records sent to his/her office.  If you experience worsening of your admission symptoms, develop shortness of breath, life threatening emergency, suicidal or homicidal thoughts you must seek medical attention immediately by calling 911 or calling your MD immediately if symptoms less severe.  You must read complete instructions/literature along with all the possible adverse reactions/side effects for all the Medicines you take and that have been prescribed to you. Take any new Medicines after you have completely understood and accept all the possible adverse reactions/side effects.   Do not  drive when taking pain medications.   Do not take more than prescribed Pain, Sleep and Anxiety Medications  Special Instructions: If you have smoked or chewed Tobacco in the last 2 yrs please stop smoking, stop any regular Alcohol and or any Recreational drug use.  Wear Seat belts while driving.  Please note  You were cared for by a hospitalist during your hospital stay. Once you are discharged, your primary care physician will handle any further medical issues. Please note that NO REFILLS for any discharge medications will be authorized once you are discharged, as it is imperative that you return to your primary care physician (or establish a relationship with a primary care physician if you do not have one) for your aftercare needs so that they can reassess your need for medications and monitor your lab values.    The results of significant diagnostics from this hospitalization (including imaging, microbiology, ancillary and laboratory) are listed below for reference.    Significant Diagnostic Studies: Ct Abdomen Pelvis W Contrast  06/14/2015  CLINICAL DATA:  Nausea and vomiting. Concern for gastric outlet obstruction. History of cholangiocarcinoma, completed chemotherapy. EXAM: CT ABDOMEN AND PELVIS WITH CONTRAST TECHNIQUE: Multidetector CT imaging of the abdomen and pelvis was performed using the standard protocol following bolus administration of intravenous contrast. CONTRAST:  148m OMNIPAQUE IOHEXOL 300 MG/ML  SOLN COMPARISON:  05/23/2015 FINDINGS: Atelectasis in the lung bases.  Coronary artery calcifications. Postoperative changes with left hepatectomy. Surgical clips along the surgical edge of the liver. Mild edema adjacent to the anastomosis. Gallbladder is surgically absent. Bile ducts are not dilated. The spleen size is normal. Small accessory spleen. Tiny sub cm low-attenuation lesion in the upper spleen likely represents a small cyst or  hemangioma. The lesion has been present on  previous studies dating back to 03/01/2013 without significant interval change. Pancreas, adrenal glands, kidneys, abdominal aorta, and inferior vena cava are unremarkable. Stomach and duodenum demonstrate wall thickening possibly indicating gastritis and duodenitis. There is no gastric distention and contrast material passes through the stomach into the small bowel and colon. No evidence of obstruction. Small bowel and colon are not abnormally distended. No free air or free fluid in the abdomen. Surgical scarring in the anterior abdominal wall with small midline hernia containing fat. Pelvis: Appendix is normal. Bladder wall is not thickened. Uterus is surgically absent. No free or loculated pelvic fluid collections. No pelvic mass or lymphadenopathy. Degenerative changes in the spine and hips. No destructive bone lesions. IMPRESSION: Previous left hepatectomy. Wall thickening in the stomach and proximal duodenum may indicate gastritis and duodenitis or peptic ulcer disease. No evidence of outlet obstruction. Electronically Signed   By: Lucienne Capers M.D.   On: 06/14/2015 22:11   Ct Abdomen Pelvis W Contrast  05/23/2015  CLINICAL DATA:  Nausea, intractable vomiting. EXAM: CT ABDOMEN AND PELVIS WITH CONTRAST TECHNIQUE: Multidetector CT imaging of the abdomen and pelvis was performed using the standard protocol following bolus administration of intravenous contrast. CONTRAST:  137m OMNIPAQUE IOHEXOL 300 MG/ML  SOLN COMPARISON:  MRI 09/15/2014 FINDINGS: Linear subsegmental atelectasis in the lung bases. Heart is mildly enlarged. No effusions. Changes of partial left hepatectomy. No focal abnormality in the remaining liver. Spleen, pancreas, adrenals and kidneys are unremarkable. Prior cholecystectomy. There is wall thickening within the gastric antrum and pylorus, possibly related to gastritis. No evidence of gastric outlet obstruction. Small bowel and large bowel are decompressed and unremarkable. No free  fluid, free air or adenopathy. Aorta is normal caliber. Prior hysterectomy. No adnexal masses. Urinary bladder is unremarkable. Appendix is visualized and is normal. Diffuse degenerative changes throughout the lumbar spine. No acute bony abnormality. IMPRESSION: Partial left hepatectomy. Circumferential wall thickening in the gastric antrum and pylorus regions. This could reflect gastritis. Prior hysterectomy and cholecystectomy. Electronically Signed   By: KRolm BaptiseM.D.   On: 05/23/2015 14:58    Microbiology: No results found for this or any previous visit (from the past 240 hour(s)).   Labs: Basic Metabolic Panel:  Recent Labs Lab 06/14/15 1256 06/15/15 0328  NA 138 134*  K 3.6 3.7  CL  --  102  CO2 26 23  GLUCOSE 153* 138*  BUN 6.0* 6  CREATININE 0.7 0.54  CALCIUM 8.3* 7.7*   Liver Function Tests:  Recent Labs Lab 06/14/15 1256  AST 18  ALT 13  ALKPHOS 76  BILITOT 0.59  PROT 5.8*  ALBUMIN 2.7*   No results for input(s): LIPASE, AMYLASE in the last 168 hours. No results for input(s): AMMONIA in the last 168 hours. CBC:  Recent Labs Lab 06/14/15 1256 06/15/15 0328 06/16/15 0316  WBC 9.1 6.3 5.3  NEUTROABS 3.9  --   --   HGB 11.5* 10.3* 10.6*  HCT 35.5 32.3* 32.7*  MCV 89.9 91.8 88.4  PLT 92* 94* 103*   Cardiac Enzymes: No results for input(s): CKTOTAL, CKMB, CKMBINDEX, TROPONINI in the last 168 hours. BNP: BNP (last 3 results) No results for input(s): BNP in the last 8760 hours.  ProBNP (last 3 results) No results for input(s): PROBNP in the last 8760 hours.  CBG:  Recent Labs Lab 06/15/15 0802 06/15/15 1206 06/15/15 1658 06/15/15 2203 06/16/15 0746  GLUCAP 173* 95 86 234* 163*  Signed:  Vernell Leep, MD, FACP, FHM. [facility] Pager [phone]  If 7PM-7AM, please contact night-coverage [email or web address] Password [email or web address] 06/16/2015, 11:23 AM

## 2015-06-16 NOTE — Progress Notes (Signed)
[facility] Progress Note  Subjective:  S/P EGD 06/15/15: Findings:  Diffuse severe inflammation characterized by erosions, erythema and   friability was found in the gastric antrum. Biopsies were taken with a   cold forceps for histology.  A benign-appearing, intrinsic moderate stenosis was found at the   pylorus. This was traversed. Edema from gastritis present and cause.  The exam was otherwise without abnormality. Impression: - Acute gastritis. Biopsied. Radiation gastritis   suspected  - Gastric stenosis was found at the pylorus. Edema   from inflammation.  - The examination was otherwise normal. Feels well today with miniaml nausea. Denies abd pain.  Objective:  Vital signs in last 24 hours: Temp:  [97.3 F (36.3 C)-98.9 F (37.2 C)] 97.9 F (36.6 C) (03/10 0536) Pulse Rate:  [63-85] 67 (03/10 0536) Resp:  [20-28] 22 (03/10 0536) BP: (118-157)/(36-66) 153/60 mmHg (03/10 0536) SpO2:  [95 %-100 %] 100 % (03/10 0536) Last BM Date: 06/15/15 General:   Alert,  Well-developed,  in NAD, oropharynx without thrush Heart:  Regular rate and rhythm; no murmurs Pulm;lungs clear Abdomen:  Soft, nontender and nondistended. Normal bowel sounds, without guarding, and without rebound.   Extremities:  Without edema. Neurologic: Alert and  oriented x4;  grossly normal neurologically. Psych: Alert and cooperative. Normal mood and affect.  Intake/Output from previous day: 03/09 0701 - 03/10 0700 In: 1963.3 [P.O.:1075; I.V.:888.3] Out: -  Intake/Output this shift:    Lab Results:  Recent Labs  06/14/15 1256 06/15/15 0328 06/16/15 0316  WBC 9.1 6.3 5.3  HGB 11.5* 10.3* 10.6*  HCT 35.5 32.3* 32.7*  PLT 92* 94* 103*   BMET  Recent Labs  06/14/15 1256 06/15/15 0328  NA 138 134*  K 3.6 3.7  CL  --  102  CO2 26 23    GLUCOSE 153* 138*  BUN 6.0* 6  CREATININE 0.7 0.54  CALCIUM 8.3* 7.7*   LFT  Recent Labs  06/14/15 1256  PROT 5.8*  ALBUMIN 2.7*  AST 18  ALT 13  ALKPHOS 76  BILITOT 0.59    Ct Abdomen Pelvis W Contrast  06/14/2015  CLINICAL DATA:  Nausea and vomiting. Concern for gastric outlet obstruction. History of cholangiocarcinoma, completed chemotherapy. EXAM: CT ABDOMEN AND PELVIS WITH CONTRAST TECHNIQUE: Multidetector CT imaging of the abdomen and pelvis was performed using the standard protocol following bolus administration of intravenous contrast. CONTRAST:  171mL OMNIPAQUE IOHEXOL 300 MG/ML  SOLN COMPARISON:  05/23/2015 FINDINGS: Atelectasis in the lung bases.  Coronary artery calcifications. Postoperative changes with left hepatectomy. Surgical clips along the surgical edge of the liver. Mild edema adjacent to the anastomosis. Gallbladder is surgically absent. Bile ducts are not dilated. The spleen size is normal. Small accessory spleen. Tiny sub cm low-attenuation lesion in the upper spleen likely represents a small cyst or hemangioma. The lesion has been present on previous studies dating back to 03/01/2013 without significant interval change. Pancreas, adrenal glands, kidneys, abdominal aorta, and inferior vena cava are unremarkable. Stomach and duodenum demonstrate wall thickening possibly indicating gastritis and duodenitis. There is no gastric distention and contrast material passes through the stomach into the small bowel and colon. No evidence of obstruction. Small bowel and colon are not abnormally distended. No free air or free fluid in the abdomen. Surgical scarring in the anterior abdominal wall with small midline hernia containing fat. Pelvis: Appendix is normal. Bladder wall is not thickened. Uterus is surgically absent. No free or loculated pelvic fluid collections. No pelvic  mass or lymphadenopathy. Degenerative changes in the spine and hips. No destructive bone lesions. IMPRESSION:  Previous left hepatectomy. Wall thickening in the stomach and proximal duodenum may indicate gastritis and duodenitis or peptic ulcer disease. No evidence of outlet obstruction. Electronically Signed   By: Lucienne Capers M.D.   On: 06/14/2015 22:11    ASSESSMENT/PLAN:  70 year old female with a history of intrahepatic invasive cholangiocarcinoma admitted earlier this week with intractable nausea and vomiting. CT in February showed circumferential wall thickening in the gastric antrum and pylorus regions. EGD yesterday showed severe radiation gastritis with some mild pyloric stenosis. Would continue twice a day PPI and 3 times a day Carafate.(Patient will need instruction as to when to take these). From GI standpoint can go home when cleared by hospitalist. Recommend full liquid or soft diet for the next 2 weeks or until her symptoms improve. Should also continue nystatin swish and swallow for 2-3 weeks for her oral thrush. We will arrange follow up in GI office.    LOS: 2 days   Xion Debruyne, Vita Barley PA-C 06/16/2015, Pager [phone] Mon-Fri 8a-5p [phone] after 5p, weekends, holidays

## 2015-06-16 NOTE — Progress Notes (Signed)
  Radiation Oncology         (336) [phone] ________________________________  Name: Bonnie Peterson MRN: [id number]  Date: 05/26/2015  DOB: 1945-04-14  End of Treatment Note  Diagnosis:   Cholangiocarcinoma     Indication for treatment::  curative       Radiation treatment dates:   04/18/15 - 05/26/15  Site/dose:   The patient was initially treated to the at risk region within the abdomen to a dose of 45 Gy using a 4 field, 3D conformal technique with daily image guidance. She then received an additional 5.4 Gy to the boost target site using an additional 4-field technique. The final dose was 50.4 Gy.  Narrative: The patient tolerated radiation treatment relatively well.   No major issues with severe nausea/ GI toxicity.  Plan: The patient has completed radiation treatment. The patient will return to radiation oncology clinic for routine followup in one month. I advised the patient to call or return sooner if they have any questions or concerns related to their recovery or treatment. ________________________________  Jodelle Gross, M.D., Ph.D.

## 2015-06-16 NOTE — Progress Notes (Signed)
Patient d/c home,stable. 

## 2015-06-17 NOTE — Progress Notes (Signed)
Not sure how to close this

## 2015-06-19 ENCOUNTER — Telehealth: Payer: Self-pay | Admitting: *Deleted

## 2015-06-19 ENCOUNTER — Telehealth: Payer: Self-pay

## 2015-06-19 NOTE — Telephone Encounter (Signed)
  Oncology Nurse Navigator Documentation  Navigator Location: [facility] (06/19/15 1300) Navigator Encounter Type: Telephone (06/19/15 1300) Telephone: Outgoing Call;Diagnostic Results (06/19/15 1300): Bonnie Peterson had CT A/P in hospital 06/14/15, which is 10 days sooner than [facility] wanted it done. No chest done at that time.                 Interventions: Other (06/19/15 1300): Left VM for triage to discuss w/MD if the scan of A/P on 06/14/15 is satisfactory or does she need another one before the appointment at [facility] on 07/13/15?  POF sent to scheduler for followup with Dr. Irene Limbo in 7-10 days per MD.                      Time Spent with Patient: 15 (06/19/15 1300)

## 2015-06-19 NOTE — Telephone Encounter (Signed)
Appt made and letter mailed with the date and time to the pt home

## 2015-06-19 NOTE — Telephone Encounter (Signed)
-----   Message from Vita Barley Hvozdovic, PA-C sent at 06/16/2015 10:43 AM EST ----- Bonnie Peterson, this lady was admitted with intractable nausea and vomiting. EGD revealed severe radiation gastritis. Patient is requesting to have a follow-up with Dr. [name]. This should be in 4-6 weeks if you could please contact her.

## 2015-06-20 ENCOUNTER — Telehealth: Payer: Self-pay | Admitting: Hematology

## 2015-06-20 NOTE — Telephone Encounter (Signed)
cld & spoke topt and gave pt time & date of appt on 3/22

## 2015-06-21 ENCOUNTER — Encounter (HOSPITAL_COMMUNITY): Payer: Self-pay | Admitting: Internal Medicine

## 2015-06-21 ENCOUNTER — Encounter: Payer: Self-pay | Admitting: Internal Medicine

## 2015-06-21 NOTE — Progress Notes (Signed)
Quick Note:  XRT gastritis Letter created No recall ______

## 2015-06-22 ENCOUNTER — Ambulatory Visit
Admission: RE | Admit: 2015-06-22 | Discharge: 2015-06-22 | Disposition: A | Discharge status: Home / Self Care | Payer: Medicare HMO | Source: Ambulatory Visit | Attending: Radiation Oncology | Admitting: Radiation Oncology

## 2015-06-22 ENCOUNTER — Encounter: Payer: Self-pay | Admitting: Radiation Oncology

## 2015-06-22 VITALS — BP 136/65 | HR 81 | Temp 97.8°F | Resp 16 | Wt 186.0 lb

## 2015-06-22 DIAGNOSIS — C221 Intrahepatic bile duct carcinoma: Secondary | ICD-10-CM

## 2015-06-22 NOTE — Progress Notes (Signed)
Follow up s/p radaiation tx to her gallbladder 04/18/15-05/26/15, was d/c hospital 06/16/15   Radiation Gastritis,  On nystatin swish and swallow, carafate 4x day, on prednisone, eating full liquid diet, ate broth with crackers and kept it down, ,jello, drinking water,grape juice cranberry juice,  Gator ade, popsicles and keeping all down, no nausea at present, no pain, energy level low 4:14 PM BP 136/65 mmHg  Pulse 81  Temp(Src) 97.8 F (36.6 C) (Oral)  Resp 16  Wt 186 lb (84.369 kg)  SpO2 100%  Wt Readings from Last 3 Encounters:  06/22/15 186 lb (84.369 kg)  06/15/15 187 lb 12.8 oz (85.186 kg)  06/14/15 189 lb 4.8 oz (85.866 kg)

## 2015-06-22 NOTE — Progress Notes (Signed)
Radiation Oncology         [phone] ________________________________  Name: Bonnie Peterson MRN: [id number]  Date: 06/22/2015  DOB: Sep 17, 1945  Follow-Up Visit Note  CC: Paula Compton, MD  Ivette Loyal, MD  Diagnosis:   Intrahepatic cholangiocarcinoma ([place])  Interval Since Last Radiation:  1 month,  S/P 54 Gy to the abdomen over 28 fractions completed between 04/18/15-05/26/15  Narrative:  The patient returns today for routine follow-up. Since her last visit, she has been evaluated again for nausea and vomiting. She ultimately was admitted between 06/14/15- 06/16/15 after another CT revealed gastric thickening. Endoscopy during this admission revealed severe inflammation with erosions, erythema, and friability of the gastric antrum. Edema and moderate intrinsic stenosis at the pylorus was noted. She has been on carafate, prednisone, and using scopolamine patches. Her symptoms have been improving significantly and she is now trying to advance her diet to include more solids.  She has plans to follow up with Dr. Irene Limbo next week, and to see GI back in May.                        ALLERGIES:  is allergic to other.  Meds: Current Outpatient Prescriptions  Medication Sig Dispense Refill  . albuterol (PROAIR HFA) 108 (90 BASE) MCG/ACT inhaler INHALE 2 PUFFS INTO THE LUNGS EVERY 4 HOURS AS NEEDED FOR WHEEZING OR SHORTNESS OF BREATH 1 Inhaler 0  . diclofenac sodium (VOLTAREN) 1 % GEL Apply 2 g topically 4 (four) times daily as needed. (Patient taking differently: Apply 2 g topically 4 (four) times daily as needed (knee pain, elbow and hands). ) 100 g 4  . fish oil-omega-3 fatty acids 1000 MG capsule Take 1 g by mouth every 30 (thirty) days.     . fluticasone (FLONASE) 50 MCG/ACT nasal spray Place 1 spray into the nose daily as needed for rhinitis.    . Hyprom-Naphaz-Polysorb-Zn Sulf (CLEAR EYES COMPLETE OP) Place 1 drop into both eyes daily as needed (dry eyes).     [name] loratadine (CLARITIN) 10 MG  tablet Take 1 tablet (10 mg total) by mouth daily as needed for allergies. 30 tablet 3  . metFORMIN (GLUCOPHAGE) 1000 MG tablet Take 0.5-1 tablets (500-1,000 mg total) by mouth 2 (two) times daily with a meal. Take 500mg  each morning, 1000mg  each night    . Multiple Vitamin (MULTIVITAMIN WITH MINERALS) TABS tablet Take 1 tablet by mouth daily.    [name] nystatin (MYCOSTATIN) 100000 UNIT/ML suspension Take 5 mLs (500,000 Units total) by mouth 4 (four) times daily. Swish and swallow. 280 mL 0  . ondansetron (ZOFRAN) 8 MG tablet Take 1 tablet (8 mg total) by mouth every 8 (eight) hours as needed for nausea or vomiting. 30 tablet 1  . pantoprazole (PROTONIX) 40 MG tablet Take 1 tablet (40 mg total) by mouth 2 (two) times daily before a meal. 60 tablet 0  . predniSONE (DELTASONE) 10 MG tablet Take 4 tablets (40 mg total) by mouth daily with breakfast. Taper down by 5 mg every 3 days to discontinue. 54 tablet 0  . scopolamine (TRANSDERM-SCOP) 1 MG/3DAYS Place 1 patch (1.5 mg total) onto the skin every 3 (three) days. 5 patch 0  . sucralfate (CARAFATE) 1 GM/10ML suspension Take 10 mLs (1 g total) by mouth 4 (four) times daily -  before meals and at bedtime. 420 mL 1  . tiotropium (SPIRIVA) 18 MCG inhalation capsule Place 1 capsule (18 mcg total) into inhaler and inhale  as needed (if its hot outside). 30 capsule 5  . VITAMIN E PO Take 1 tablet by mouth daily.     . cholecalciferol (VITAMIN D) 1000 UNITS tablet Take 1 tablet (1,000 Units total) by mouth daily. (Patient not taking: Reported on 06/22/2015) 30 tablet 11  . colchicine 0.6 MG tablet TAKE 1 TABLET DAILY AS NEEDED FOR GOUT FLARE (Patient not taking: Reported on 06/22/2015) 30 tablet 1  . Tdap (BOOSTRIX) 5-2.5-18.5 LF-MCG/0.5 injection Inject 0.5 mLs into the muscle once. (Patient not taking: Reported on 06/14/2015) 0.5 mL 0  . traMADol (ULTRAM) 50 MG tablet Take 1 tablet (50 mg total) by mouth every 6 (six) hours as needed for moderate pain or severe pain.  (Patient not taking: Reported on 06/22/2015) 30 tablet 0   No current facility-administered medications for this encounter.    Physical Findings:  weight is 186 lb (84.369 kg). Her oral temperature is 97.8 F (36.6 C). Her blood pressure is 136/65 and her pulse is 81. Her respiration is 16 and oxygen saturation is 100%. .   Pain scale 0/10 In general this is a well appearing African American female in no acute distress. She's alert and oriented x4 and appropriate throughout the examination. Cardiopulmonary assessment is negative for acute distress and she exhibits normal effort. Her abdomen is soft, non tender, non distended. Her skin is intact without lesions, excoriations, or hyperpigmentation of the treatment field.   Lab Findings: Lab Results  Component Value Date   WBC 5.3 06/16/2015   HGB 10.6* 06/16/2015   HCT 32.7* 06/16/2015   MCV 88.4 06/16/2015   PLT 103* 06/16/2015     Radiographic Findings: Ct Abdomen Pelvis W Contrast  06/14/2015  CLINICAL DATA:  Nausea and vomiting. Concern for gastric outlet obstruction. History of cholangiocarcinoma, completed chemotherapy. EXAM: CT ABDOMEN AND PELVIS WITH CONTRAST TECHNIQUE: Multidetector CT imaging of the abdomen and pelvis was performed using the standard protocol following bolus administration of intravenous contrast. CONTRAST:  112mL OMNIPAQUE IOHEXOL 300 MG/ML  SOLN COMPARISON:  05/23/2015 FINDINGS: Atelectasis in the lung bases.  Coronary artery calcifications. Postoperative changes with left hepatectomy. Surgical clips along the surgical edge of the liver. Mild edema adjacent to the anastomosis. Gallbladder is surgically absent. Bile ducts are not dilated. The spleen size is normal. Small accessory spleen. Tiny sub cm low-attenuation lesion in the upper spleen likely represents a small cyst or hemangioma. The lesion has been present on previous studies dating back to 03/01/2013 without significant interval change. Pancreas, adrenal  glands, kidneys, abdominal aorta, and inferior vena cava are unremarkable. Stomach and duodenum demonstrate wall thickening possibly indicating gastritis and duodenitis. There is no gastric distention and contrast material passes through the stomach into the small bowel and colon. No evidence of obstruction. Small bowel and colon are not abnormally distended. No free air or free fluid in the abdomen. Surgical scarring in the anterior abdominal wall with small midline hernia containing fat. Pelvis: Appendix is normal. Bladder wall is not thickened. Uterus is surgically absent. No free or loculated pelvic fluid collections. No pelvic mass or lymphadenopathy. Degenerative changes in the spine and hips. No destructive bone lesions. IMPRESSION: Previous left hepatectomy. Wall thickening in the stomach and proximal duodenum may indicate gastritis and duodenitis or peptic ulcer disease. No evidence of outlet obstruction. Electronically Signed   By: Lucienne Capers M.D.   On: 06/14/2015 22:11    Impression/Plan: 1. Stage IV (T2a, pN1, M0) intrahepatic cholangiocarcinoma. The patient was able to complete her radiotherapy.  She will move forward with follow up with Dr. Irene Limbo next week. We will be happy to participate in her care moving forward should she have a need for radiotherapy. 2. Radiation induced gastritis. The patient will continue prednisone, carafate, and scopolamine patches until her symptoms resolve as outlined per GI. We appreciate their assistance in managing her symptoms, and anticipate that these will resolve in the near future.     Carola Rhine, PAC

## 2015-06-28 ENCOUNTER — Ambulatory Visit (HOSPITAL_BASED_OUTPATIENT_CLINIC_OR_DEPARTMENT_OTHER): Payer: Medicare HMO | Admitting: Hematology

## 2015-06-28 ENCOUNTER — Telehealth: Payer: Self-pay | Admitting: Hematology

## 2015-06-28 ENCOUNTER — Encounter: Payer: Self-pay | Admitting: Hematology

## 2015-06-28 VITALS — BP 154/57 | HR 90 | Temp 98.2°F | Resp 18 | Ht 63.0 in | Wt 182.7 lb

## 2015-06-28 DIAGNOSIS — C221 Intrahepatic bile duct carcinoma: Secondary | ICD-10-CM | POA: Diagnosis not present

## 2015-06-28 DIAGNOSIS — D6959 Other secondary thrombocytopenia: Secondary | ICD-10-CM | POA: Diagnosis not present

## 2015-06-28 DIAGNOSIS — K2960 Other gastritis without bleeding: Secondary | ICD-10-CM

## 2015-06-28 MED ORDER — PANTOPRAZOLE SODIUM 40 MG PO TBEC: 40.0000 mg | DELAYED_RELEASE_TABLET | Freq: Two times a day (BID) | ORAL | Status: DC

## 2015-06-28 MED ORDER — SCOPOLAMINE 1 MG/3DAYS TD PT72: 1.0000 | MEDICATED_PATCH | TRANSDERMAL | Status: DC

## 2015-06-28 MED ORDER — SUCRALFATE 1 GM/10ML PO SUSP: 1.0000 g | Freq: Three times a day (TID) | ORAL | Status: DC

## 2015-06-28 MED FILL — PANTOPRAZOLE SOD DR 40 MG T: 40 | 30 days supply | Qty: 60 | Fill #0

## 2015-06-28 MED FILL — CARAFATE 1 GM/10 ML SUSP: 1 | 10 days supply | Qty: 420 | Fill #0

## 2015-06-28 MED FILL — TRANSDERM-SCOP 1.5 MG/3 DAY: 1 | 15 days supply | Qty: 5 | Fill #0

## 2015-06-28 NOTE — Progress Notes (Signed)
Bonnie Peterson    HEMATOLOGY/ONCOLOGY CLINIC NOTE  Date of Service: .06/28/2015    Patient Care Team: Bonnie Hacker, MD as PCP - General (Family Medicine) Bonnie Hacker, MD as Resident (Family Medicine)  CHIEF COMPLAINTS/PURPOSE OF CONSULTATION:   F/u for Cholangiocarcinoma s/p completion of concurrent chemo-Radiation.  DIAGNOSIS  Stage IVA (T2a, pN1, M0) Intrahepatic Cholangiocarcinoma  Current Treatment  -Concurrent Capecitabine 886m/m2 po BID M-F while on concurrent RT Final date of RT planned for 05/25/2015  Previous treatment 02/09/15 L lobe of liver partial hepatectomy segments 2, 3, 4a, 4b invasive cholangiocarcinoma, moderately differentiated. Tumor focally extends to the parenchymal margin of resection. Uninvolved liver parenchyma with steatohepatitis and associated periportal and centrilobular pericellular fibrosis. Scattered von Meyenburg complexes. One hepatic artery LN with metastatic cholangiocarcinoma. Fibroadipose tissue and nerve negative for malignancy. Gallbladder negative for malignancy. One LN negative for malignancy. T2aN1 invasive cholangiocarcinoma, moderately differentiated, with positive hepatic parenchymal margin, and 1/2 nodes positive for tumor involvement.    Interval History  Bonnie Peterson here for follow posthospitalization for nausea and vomiting related to radiation gastritis with some mild pyloric stenosis.  She notes that she has been tolerating oral intake much better but she still has been maintaining a preference for soft foods and liquids as instructed.  Her nausea has been much better controlled.  She appears to be in good spirits with improving energy levels.  She is on tapering doses of prednisone. He is due to get a CT of her chest which we shall schedule within the next week.  [facility] No other acute new symptoms.  She has been moving her bowels well.  MEDICAL HISTORY:  Past Medical History    Diagnosis Date  . Obstructive sleep apnea   . HTN, goal below 130/80   . HLD (hyperlipidemia)   . Benign positional vertigo   . Depression   . Anxiety   . Irritable bowel syndrome   . Obesity, Class III, BMI 40-49.9 (morbid obesity) ([place]   . Gout   . Restrictive lung disease   . Echocardiogram findings abnormal, without diagnosis 10/10    10/10: mild pulm HTN, EF 60-65%, mild LVH, moderate aortic regurg  . Early cataracts, bilateral 10/13    Optho, Dr SGershon Crane . COPD (chronic obstructive pulmonary disease) ([place]   . Osteoarthritis (arthritis due to wear and tear of joints)     also gout  . [place]    intrahepatic cholangiocarcinoma   SURGICAL HISTORY: Past Surgical History  Procedure Laterality Date  . Partial hysterectomy    . Rotator cuff repair        L rotator cuff repair 11/07-[place] - 12/7/200  . Tonsillectomy    . Liver biopsy    . Abdominal hysterectomy    . Open partial hepatectomy [83] Left 02/09/15  . Esophagogastroduodenoscopy N/A 06/15/2015    Procedure: ESOPHAGOGASTRODUODENOSCOPY (EGD);  Surgeon: CGatha Mayer MD;  [facility];  Service: Endoscopy;  Laterality: N/A;    SOCIAL HISTORY: Social History   Social History  . Marital Status: Single    Spouse Name: N/A  . Number of Children: 4  . Years of Education: N/A   Occupational History  . unemployed    Social History Main Topics  . Smoking status: Current Some Day Smoker -- 0.50 packs/day for 40 years    Types: Cigarettes  . Smokeless tobacco: Never Used  . Alcohol Use: 0.0 oz/week    0  Standard drinks or equivalent per week     Comment: occasionally  . Drug Use: No  . Sexual Activity: No   Other Topics Concern  . 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 Breast cancer Mother   . Hypertension Mother   . Coronary artery disease Mother   . Diabetes type II Sister   . Pancreatic cancer Sister   . Diabetes type II Mother     ALLERGIES:  is allergic to other.  MEDICATIONS:  Current Outpatient Prescriptions  Medication Sig Dispense Refill  . albuterol (PROAIR HFA) 108 (90 BASE) MCG/ACT inhaler INHALE 2 PUFFS INTO THE LUNGS EVERY 4 HOURS AS NEEDED FOR WHEEZING OR SHORTNESS OF BREATH 1 Inhaler 0  . cholecalciferol (VITAMIN D) 1000 UNITS tablet Take 1 tablet (1,000 Units total) by mouth daily. (Patient not taking: Reported on 06/22/2015) 30 tablet 11  . colchicine 0.6 MG tablet TAKE 1 TABLET DAILY AS NEEDED FOR GOUT FLARE (Patient not taking: Reported on 06/22/2015) 30 tablet 1  . diclofenac sodium (VOLTAREN) 1 % GEL Apply 2 g topically 4 (four) times daily as needed. (Patient taking differently: Apply 2 g topically 4 (four) times daily as needed (knee pain, elbow and hands). ) 100 g 4  . fish oil-omega-3 fatty acids 1000 MG capsule Take 1 g by mouth every 30 (thirty) days.     . fluticasone (FLONASE) 50 MCG/ACT nasal spray Place 1 spray into the nose daily as needed for rhinitis.    . Hyprom-Naphaz-Polysorb-Zn Sulf (CLEAR EYES COMPLETE OP) Place 1 drop into both eyes daily as needed (dry eyes).     Bonnie Peterson loratadine (CLARITIN) 10 MG tablet Take 1 tablet (10 mg total) by mouth daily as needed for allergies. 30 tablet 3  . metFORMIN (GLUCOPHAGE) 1000 MG tablet Take 0.5-1 tablets (500-1,000 mg total) by mouth 2 (two) times daily with a meal. Take 54m each morning, 10047meach night    . Multiple Vitamin (MULTIVITAMIN WITH MINERALS) TABS tablet Take 1 tablet by mouth daily.    . Bonnie Peterson (MYCOSTATIN) 100000 UNIT/ML suspension Take 5 mLs (500,000 Units total) by mouth 4 (four) times daily. Swish and  swallow. 280 mL 0  . ondansetron (ZOFRAN) 8 MG tablet Take 1 tablet (8 mg total) by mouth every 8 (eight) hours as needed for nausea or vomiting. 30 tablet 1  . pantoprazole (PROTONIX) 40 MG tablet Take 1 tablet (40 mg total) by mouth 2 (two) times daily before a meal. 60 tablet 0  . predniSONE (DELTASONE) 10 MG tablet Take 4 tablets (40 mg total) by mouth daily with breakfast. Taper down by 5 mg every 3 days to discontinue. 54 tablet 0  . scopolamine (TRANSDERM-SCOP) 1 MG/3DAYS Place 1 patch (1.5 mg total) onto the skin every 3 (three) days. 5 patch 0  . sucralfate (CARAFATE) 1 GM/10ML suspension Take 10 mLs (1 g total) by mouth 4 (four) times daily -  before meals and at bedtime. 420 mL 1  . Tdap (BOOSTRIX) 5-2.5-18.5 LF-MCG/0.5 injection Inject 0.5 mLs into the muscle  once. (Patient not taking: Reported on 06/14/2015) 0.5 mL 0  . tiotropium (SPIRIVA) 18 MCG inhalation capsule Place 1 capsule (18 mcg total) into inhaler and inhale as needed (if its hot outside). 30 capsule 5  . traMADol (ULTRAM) 50 MG tablet Take 1 tablet (50 mg total) by mouth every 6 (six) hours as needed for moderate pain or severe pain. (Patient not taking: Reported on 06/22/2015) 30 tablet 0  . VITAMIN E PO Take 1 tablet by mouth daily.      No current facility-administered medications for this visit.    REVIEW OF SYSTEMS:    10 Point review of Systems was done is negative except as noted above.  PHYSICAL EXAMINATION: ECOG PERFORMANCE STATUS: 1 - Symptomatic but completely ambulatory Vital signs reviewed in Epic Afebrile temperature 98.6, breathing at 18, pulse 66/m, blood pressure 155/58.  GENERAL:alert, in no acute distress and comfortable SKIN: skin color, texture, turgor are normal, no rashes or significant lesions EYES: normal, conjunctiva are pink and non-injected, sclera clear OROPHARYNX: Mucous membranes moist, oral thrush resolved. NECK: supple, no JVD, thyroid normal size, non-tender, without  nodularity LYMPH:  no palpable lymphadenopathy in the cervical, axillary or inguinal LUNGS: clear to auscultation with normal respiratory effort HEART: regular rate & rhythm,  no murmurs and no lower extremity edema ABDOMEN: abdomen appears distended especially in the upper belly. Hypoactive bowel sounds. No guarding rigidity or rebound. Musculoskeletal: no cyanosis of digits and no clubbing  PSYCH: alert & oriented x 3 with fluent speech NEURO: no focal motor/sensory deficits  LABORATORY DATA:  . CBC Latest Ref Rng 06/16/2015 06/15/2015 06/14/2015  WBC 4.0 - 10.5 K/uL 5.3 6.3 9.1  Hemoglobin 12.0 - 15.0 g/dL 10.6(L) 10.3(L) 11.5(L)  Hematocrit 36.0 - 46.0 % 32.7(L) 32.3(L) 35.5  Platelets 150 - 400 K/uL 103(L) 94(L) 92(L)   . CMP Latest Ref Rng 06/15/2015 06/14/2015 05/31/2015  Glucose 65 - 99 mg/dL 138(H) 153(H) 156(H)  BUN 6 - 20 mg/dL 6 6.0(L) 9.0  Creatinine 0.44 - 1.00 mg/dL 0.54 0.7 0.8  Sodium 135 - 145 mmol/L 134(L) 138 140  Potassium 3.5 - 5.1 mmol/L 3.7 3.6 3.7  Chloride 101 - 111 mmol/L 102 - -  CO2 22 - 32 mmol/L 23 26 27   Calcium 8.9 - 10.3 mg/dL 7.7(L) 8.3(L) 9.2  Total Protein 6.4 - 8.3 g/dL - 5.8(L) 7.0  Total Bilirubin 0.20 - 1.20 mg/dL - 0.59 0.75  Alkaline Phos 40 - 150 U/L - 76 77  AST 5 - 34 U/L - 18 19  ALT 0 - 55 U/L - 13 15   RADIOGRAPHIC STUDIES: I have personally reviewed the radiological images as listed and agreed with the findings in the report. Ct Abdomen Pelvis W Contrast  06/14/2015  CLINICAL DATA:  Nausea and vomiting. Concern for gastric outlet obstruction. History of cholangiocarcinoma, completed chemotherapy. EXAM: CT ABDOMEN AND PELVIS WITH CONTRAST TECHNIQUE: Multidetector CT imaging of the abdomen and pelvis was performed using the standard protocol following bolus administration of intravenous contrast. CONTRAST:  170m OMNIPAQUE IOHEXOL 300 MG/ML  SOLN COMPARISON:  05/23/2015 FINDINGS: Atelectasis in the lung bases.  Coronary artery calcifications.  Postoperative changes with left hepatectomy. Surgical clips along the surgical edge of the liver. Mild edema adjacent to the anastomosis. Gallbladder is surgically absent. Bile ducts are not dilated. The spleen size is normal. Small accessory spleen. Tiny sub cm low-attenuation lesion in the upper spleen likely represents a small cyst or hemangioma. The lesion has been present on previous studies  dating back to 03/01/2013 without significant interval change. Pancreas, adrenal glands, kidneys, abdominal aorta, and inferior vena cava are unremarkable. Stomach and duodenum demonstrate wall thickening possibly indicating gastritis and duodenitis. There is no gastric distention and contrast material passes through the stomach into the small bowel and colon. No evidence of obstruction. Small bowel and colon are not abnormally distended. No free air or free fluid in the abdomen. Surgical scarring in the anterior abdominal wall with small midline hernia containing fat. Pelvis: Appendix is normal. Bladder wall is not thickened. Uterus is surgically absent. No free or loculated pelvic fluid collections. No pelvic mass or lymphadenopathy. Degenerative changes in the spine and hips. No destructive bone lesions. IMPRESSION: Previous left hepatectomy. Wall thickening in the stomach and proximal duodenum may indicate gastritis and duodenitis or peptic ulcer disease. No evidence of outlet obstruction. Electronically Signed   By: Lucienne Capers M.D.   On: 06/14/2015 22:11     ASSESSMENT & PLAN:   70 yo with multiple medical co-morbids  1) Intrahepatic invasive cholangiocarcinoma T2a N1 M0 (Stage IVA) with focal positive parenchymal margins and 1/2 LN positive. CT C/A/P on 04/03/2016 showed no evidence of cholangiocarcinoma local recurrence or metastases at this time. Patient  has completed her adjuvant concurrent chemo-radiation  on 05/25/2015 . She did overall quite well with treatment without any overt prohibitive  toxicities.   2) grade 1 fatigue -Improving though currently notes that she feels somewhat weak likely due to dehydration .  3) Mild thrombocytopenia PLT 110K likely due to chemotherapy.  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 Patient was seen in the emergency room on 05/23/2015 and had a CT abdomen for this which showed circumferential wall thickening in the gastric antrum and pylorus regions likely from radiation gastritis.  Recent admission to the hospital with EGD that confirmed radiation gastritis and some functional pyloric narrowing.  Symptoms have significantly improved and she has had improving po intake.  Plan -Complete the remaining prednisone taper. -Given refill for sucralfate and PPI to be continued. -When necessary Zofran for nausea. -Would like to continue the scopolamine patch. -Recommended soft foods with adequate showing an adequate liquids. -Patient has had recent CT of the abdomen and pelvis for symptom evaluation which serve as her restaging abdominal imaging. -We'll get CT of the chest to complete her restaging imaging within the next week. -She has [facility] at [facility] on [facility]13/2015 for her scheduled followup. -I shall see her back in 2 months for reevaluation with repeat labs.  Patient has the contact information to our clinic if any other questions or concerns arise.  Plan of care was discussed with the patient in detail and she is agreeable to this.   5)  oropharyngeal thrush -resolved.   RTC with Dr Irene Limbo in 2 weeks with cbc, cmp, ca 19-9   Sullivan Lone MD [place] AAHIVMS [facility] Hematology/Oncology Physician [facility]  (Office):       [phone] (Work cell):  [phone] (Fax):           [phone]

## 2015-06-28 NOTE — Telephone Encounter (Signed)
Left message for patient re lab/fu for May. Central radiology will contact patient re ct-scan to be performed next week. Schedule mailed.

## 2015-07-03 ENCOUNTER — Telehealth: Payer: Self-pay | Admitting: *Deleted

## 2015-07-03 NOTE — Telephone Encounter (Signed)
Oncology Nurse Navigator Documentation  Oncology Nurse Navigator Flowsheets 07/03/2015  Navigator Location [facility]  Navigator Encounter Type Telephone  Telephone Outgoing Call;Appt Confirmation/Clarification  Treatment Initiated Date -  Patient Visit Type -  Treatment Phase -  Barriers/Navigation Needs Education--instructed her to request a CD of her CT chest/abd/pelvis to take to [facility] with her.  Education -  Interventions Coordination of Care  Coordination of Care Radiology--rescheduled her 4/10 CT chest to 3/31 prior to visit at [facility].   Education Method -  Support Groups/Services -  Acuity -  Time Spent with Patient 15

## 2015-07-07 ENCOUNTER — Ambulatory Visit (HOSPITAL_COMMUNITY)
Admission: RE | Admit: 2015-07-07 | Discharge: 2015-07-07 | Disposition: A | Discharge status: Home / Self Care | Payer: Medicare HMO | Source: Ambulatory Visit | Attending: Hematology | Admitting: Hematology

## 2015-07-07 ENCOUNTER — Encounter (HOSPITAL_COMMUNITY): Payer: Self-pay

## 2015-07-07 ENCOUNTER — Encounter: Payer: Self-pay | Admitting: *Deleted

## 2015-07-07 DIAGNOSIS — C221 Intrahepatic bile duct carcinoma: Secondary | ICD-10-CM

## 2015-07-07 DIAGNOSIS — Z9889 Other specified postprocedural states: Secondary | ICD-10-CM | POA: Insufficient documentation

## 2015-07-07 DIAGNOSIS — D174 Benign lipomatous neoplasm of intrathoracic organs: Secondary | ICD-10-CM | POA: Insufficient documentation

## 2015-07-07 DIAGNOSIS — I2510 Atherosclerotic heart disease of native coronary artery without angina pectoris: Secondary | ICD-10-CM | POA: Diagnosis not present

## 2015-07-07 DIAGNOSIS — M4813 Ankylosing hyperostosis [Forestier], cervicothoracic region: Secondary | ICD-10-CM | POA: Diagnosis not present

## 2015-07-07 DIAGNOSIS — R911 Solitary pulmonary nodule: Secondary | ICD-10-CM | POA: Diagnosis not present

## 2015-07-07 DIAGNOSIS — J439 Emphysema, unspecified: Secondary | ICD-10-CM | POA: Insufficient documentation

## 2015-07-07 DIAGNOSIS — R918 Other nonspecific abnormal finding of lung field: Secondary | ICD-10-CM | POA: Diagnosis not present

## 2015-07-07 MED ORDER — IOPAMIDOL (ISOVUE-300) INJECTION 61%
75.0000 mL | Freq: Once | INTRAVENOUS | Status: AC | PRN
  Administered 2015-07-07: 75 mL via INTRAVENOUS

## 2015-07-07 NOTE — Progress Notes (Signed)
  Oncology Nurse Navigator Documentation  Navigator Location: [facility] (07/07/15 0802) Navigator Encounter Type: Telephone;Letter/Fax/Email (07/07/15 2336) Telephone: Outgoing Call;Diagnostic Results (07/07/15 1224) : Called [facility] CT to request there CT chest today and her abd/pelvis CT of 06/24/15 be put on CD today and given to her prior to leaving to take to a [facility] appointment on 07/13/15.                     Coordination of Care: Radiology (07/07/15 4975) : Will fax last CT C/A/P to Dr. Colonel Bald at [facility].                  Time Spent with Patient: 15 (07/07/15 3005)

## 2015-07-11 ENCOUNTER — Telehealth: Payer: Self-pay | Admitting: *Deleted

## 2015-07-11 NOTE — Telephone Encounter (Signed)
  Oncology Nurse Navigator Documentation  Navigator Location: [facility] (07/11/15 1646) Navigator Encounter Type: Telephone (07/11/15 1646) Telephone: Outgoing Call (07/11/15 1646)  Called to confirm she has her CD to take to [place] appointment with her scans. She confirms she has it and is doing well.

## 2015-07-17 ENCOUNTER — Ambulatory Visit (HOSPITAL_COMMUNITY): Payer: Medicare HMO

## 2015-07-18 ENCOUNTER — Other Ambulatory Visit: Payer: Self-pay | Admitting: Family Medicine

## 2015-07-18 ENCOUNTER — Telehealth: Payer: Self-pay | Admitting: *Deleted

## 2015-07-18 NOTE — Telephone Encounter (Signed)
TC from patient to verify appt for lab and Dr. Irene Limbo for May. Advised pt of her appt dates and times. She voiced understanding.

## 2015-08-15 ENCOUNTER — Ambulatory Visit (INDEPENDENT_AMBULATORY_CARE_PROVIDER_SITE_OTHER): Payer: Medicare HMO | Admitting: Gastroenterology

## 2015-08-15 ENCOUNTER — Encounter: Payer: Self-pay | Admitting: Gastroenterology

## 2015-08-15 ENCOUNTER — Other Ambulatory Visit (INDEPENDENT_AMBULATORY_CARE_PROVIDER_SITE_OTHER): Payer: Medicare HMO

## 2015-08-15 VITALS — BP 120/60 | HR 68 | Ht 63.0 in | Wt 175.0 lb

## 2015-08-15 DIAGNOSIS — K835 Biliary cyst: Secondary | ICD-10-CM

## 2015-08-15 DIAGNOSIS — K838 Other specified diseases of biliary tract: Secondary | ICD-10-CM

## 2015-08-15 LAB — COMPREHENSIVE METABOLIC PANEL
ALT: 12 U/L (ref 0–35)
AST: 15 U/L (ref 0–37)
Albumin: 3.6 g/dL (ref 3.5–5.2)
Alkaline Phosphatase: 90 U/L (ref 39–117)
BUN: 10 mg/dL (ref 6–23)
CO2: 29 mEq/L (ref 19–32)
Calcium: 9.6 mg/dL (ref 8.4–10.5)
Chloride: 103 mEq/L (ref 96–112)
Creatinine, Ser: 0.7 mg/dL (ref 0.40–1.20)
GFR: 106.67 mL/min (ref >60.00)
Glucose, Bld: 117 mg/dL — ABNORMAL HIGH (ref 70–99)
Potassium: 4.2 mEq/L (ref 3.5–5.1)
Sodium: 140 mEq/L (ref 135–145)
Total Bilirubin: 0.4 mg/dL (ref 0.2–1.2)
Total Protein: 7.2 g/dL (ref 6.0–8.3)

## 2015-08-15 LAB — CBC WITH DIFFERENTIAL/PLATELET
Basophils Absolute: 0 10*3/uL (ref 0.0–0.1)
Basophils Relative: 0.4 % (ref 0.0–3.0)
Eosinophils Absolute: 0.2 10*3/uL (ref 0.0–0.7)
Eosinophils Relative: 3 % (ref 0.0–5.0)
HCT: 38.7 % (ref 36.0–46.0)
Hemoglobin: 12.8 g/dL (ref 12.0–15.0)
Lymphocytes Relative: 34.1 % (ref 12.0–46.0)
Lymphs Abs: 2.3 10*3/uL (ref 0.7–4.0)
MCHC: 33.1 g/dL (ref 30.0–36.0)
MCV: 89.3 fl (ref 78.0–100.0)
Monocytes Absolute: 0.7 10*3/uL (ref 0.1–1.0)
Monocytes Relative: 10.5 % (ref 3.0–12.0)
Neutro Abs: 3.5 10*3/uL (ref 1.4–7.7)
Neutrophils Relative %: 52 % (ref 43.0–77.0)
Platelets: 153 10*3/uL (ref 150.0–400.0)
RBC: 4.34 Mil/uL (ref 3.87–5.11)
RDW: 13.7 % (ref 11.5–15.5)
WBC: 6.7 10*3/uL (ref 4.0–10.5)

## 2015-08-15 MED ORDER — TRAMADOL HCL 50 MG PO TABS: 50.0000 mg | ORAL_TABLET | Freq: Four times a day (QID) | ORAL | Status: DC | PRN

## 2015-08-15 MED FILL — traMADol HCL 50 MG TABS: 50 | 15 days supply | Qty: 60 | Fill #0

## 2015-08-15 NOTE — Patient Instructions (Addendum)
You have been given a separate informational sheet regarding your tobacco use, the importance of quitting and local resources to help you quit. Refill on tramadol (50mg  pill, take one pill 2-3 times daily as needed).  Dispense 60. You will have labs checked today in the basement lab.  Please head down after you check out with the front desk  (cbc, cmet). Please return to see Dr. [name] as needed.

## 2015-08-15 NOTE — Progress Notes (Signed)
Review of pertinent gastrointestinal problems: 1. 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 CT scan of the abdomen and pelvis which also shows a 3.7 x 2.1 x 2.4 cm arterial enhancing lesion. there were no additional hepatic masses identified hepatic and portal vein patent, spleen and pancreas grossly unremarkable, question possible mild cirrhotic changes  Hepatic panel was last done on 02/10/2013 and was normal ProTime 12.8 INR of 0.9 hepatitis serologies were negative with the exception of hepatitis B surface antibody positive. Alpha-fetoprotein and CEA were normal. 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 Slightly enlarged from 2.3 x 2.2 cm on the prior exam (when remeasured). Planning to repeat MRI in 1 year. Repeat MRI 2016 suggested a liver lesion had increased in size. Repeat biopsies showed atypical cells. Second opinion at [facility] felt that this was cholangiocarcinoma and she underwent hepatic resection.  02/09/15 L lobe of liver partial hepatectomy segments 2, 3, 4a, 4b invasive cholangiocarcinoma, moderately differentiated. Tumor focally extends to the parenchymal margin of resection. Uninvolved liver parenchyma with steatohepatitis and associated periportal and centrilobular pericellular fibrosis. Scattered von Meyenburg complexes. One hepatic artery LN with metastatic cholangiocarcinoma. Fibroadipose tissue and nerve negative for malignancy. Gallbladder negative for malignancy. One LN negative for malignancy. T2aN1 invasive cholangiocarcinoma, moderately differentiated, with positive hepatic parenchymal margin, and 1/2 nodes positive for tumor involvement. 2. Adenomatous colon  polyps: Colonsocopy [name] 06/2013 for minor rectal bleeding: two small TAs removed (recall 5 years) 3. 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 4. 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 EGD Dr. Carlean Purl 06/2015 gastritis, pyloric stenosis felt from edema (incomplete), path c/w radiation damage.  HPI: This is a very pleasant 70 yo woman who I last saw about a year ago  Chief complaint is cholangiocarciooma  Feeling better overall better since April,  Last XRT was in February.  The vomiting is gone.  The severe nausea is gone.  She no longer takes antinausea meds.  Her weight has been stable.  Has mild intermittent post prandial pains, but pretty mild.     Past Medical History  Diagnosis Date  . Obstructive sleep apnea   . HTN, goal below 130/80   . HLD (hyperlipidemia)   . Benign positional vertigo   . Depression   . Anxiety   . Irritable bowel syndrome   . Obesity, Class III, BMI 40-49.9 (morbid obesity) ([place])   . Gout   . Restrictive lung disease   . Echocardiogram findings abnormal, without diagnosis 10/10    10/10: mild pulm HTN, EF 60-65%, mild LVH, moderate aortic regurg  . Early cataracts, bilateral 10/13    Optho, Dr Gershon Crane  . COPD (chronic obstructive pulmonary disease) ([place])   . Osteoarthritis (arthritis due to wear and tear of joints)     also gout  . Cancer [facility]) 02/09/15    intrahepatic cholangiocarcinoma    Past Surgical History  Procedure Laterality Date  . Partial hysterectomy    . Rotator cuff repair        L rotator cuff repair 11/07-[place] - 12/7/200  . Tonsillectomy    . Liver biopsy    . Abdominal  hysterectomy    . Open partial hepatectomy [83] Left 02/09/15  . Esophagogastroduodenoscopy N/A 06/15/2015    Procedure: ESOPHAGOGASTRODUODENOSCOPY (EGD);  Surgeon: Gatha Mayer, MD;  Location: [facility];  Service: Endoscopy;   Laterality: N/A;    Current Outpatient Prescriptions  Medication Sig Dispense Refill  . cholecalciferol (VITAMIN D) 1000 UNITS tablet Take 1 tablet (1,000 Units total) by mouth daily. 30 tablet 11  . diclofenac sodium (VOLTAREN) 1 % GEL Apply 2 g topically 4 (four) times daily as needed. (Patient taking differently: Apply 2 g topically 4 (four) times daily as needed (knee pain, elbow and hands). ) 100 g 4  . fish oil-omega-3 fatty acids 1000 MG capsule Take 1 g by mouth every 30 (thirty) days.     . fluticasone (FLONASE) 50 MCG/ACT nasal spray Place 1 spray into the nose daily as needed for rhinitis.    . Hyprom-Naphaz-Polysorb-Zn Sulf (CLEAR EYES COMPLETE OP) Place 1 drop into both eyes daily as needed (dry eyes).     [name] loratadine (CLARITIN) 10 MG tablet Take 1 tablet (10 mg total) by mouth daily as needed for allergies. 30 tablet 3  . metFORMIN (GLUCOPHAGE) 1000 MG tablet Take 0.5-1 tablets (500-1,000 mg total) by mouth 2 (two) times daily with a meal. Take 55m each morning, 10051meach night    . Multiple Vitamin (MULTIVITAMIN WITH MINERALS) TABS tablet Take 1 tablet by mouth daily.    . Marland KitchenROAIR HFA 108 (90 Base) MCG/ACT inhaler INHALE 2 PUFFS INTO THE LUNGS EVERY 4 HOURS AS NEEDED FOR WHEEZING OR SHORTNESS OF BREATH 1 Inhaler 0  . tiotropium (SPIRIVA) 18 MCG inhalation capsule Place 1 capsule (18 mcg total) into inhaler and inhale as needed (if its hot outside). 30 capsule 5  . traMADol (ULTRAM) 50 MG tablet Take 1 tablet (50 mg total) by mouth every 6 (six) hours as needed for moderate pain or severe pain. 30 tablet 0  . VITAMIN E PO Take 1 tablet by mouth daily.      No current facility-administered medications for this visit.    Allergies as of 08/15/2015 - Review Complete 08/15/2015  Allergen Reaction Noted  . Other  09/29/2014    Family History  Problem Relation Age of Onset  . Breast cancer Mother   . Hypertension Mother   . Coronary artery disease Mother   . Diabetes type  II Sister   . Pancreatic cancer Sister   . Diabetes type II Mother     Social History   Social History  . Marital Status: Single    Spouse Name: N/A  . Number of Children: 4  . Years of Education: N/A   Occupational History  . unemployed    Social History Main Topics  . Smoking status: Current Some Day Smoker -- 0.50 packs/day for 40 years    Types: Cigarettes  . Smokeless tobacco: Never Used  . Alcohol Use: 0.0 oz/week    0 Standard drinks or equivalent per week     Comment: occasionally  . Drug Use: Yes  . Sexual Activity: No   Other Topics Concern  . 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 female with cholangiocarcinoma  Her radiation gastritis seems to have subsided for the most part.  Will get basic set of labs, she's been itching and this can be from liver problems.  Otherwise ROV PRN   Owens Loffler, MD [facility] 08/15/2015, 1:53 PM

## 2015-08-28 ENCOUNTER — Other Ambulatory Visit (HOSPITAL_BASED_OUTPATIENT_CLINIC_OR_DEPARTMENT_OTHER): Payer: Medicare HMO

## 2015-08-28 ENCOUNTER — Telehealth: Payer: Self-pay | Admitting: Hematology

## 2015-08-28 ENCOUNTER — Encounter: Payer: Self-pay | Admitting: Hematology

## 2015-08-28 ENCOUNTER — Ambulatory Visit (HOSPITAL_BASED_OUTPATIENT_CLINIC_OR_DEPARTMENT_OTHER): Payer: Medicare HMO | Admitting: Hematology

## 2015-08-28 VITALS — BP 121/64 | HR 64 | Temp 97.7°F | Resp 18 | Ht 63.0 in | Wt 171.1 lb

## 2015-08-28 DIAGNOSIS — D696 Thrombocytopenia, unspecified: Secondary | ICD-10-CM

## 2015-08-28 DIAGNOSIS — C221 Intrahepatic bile duct carcinoma: Secondary | ICD-10-CM

## 2015-08-28 DIAGNOSIS — K2960 Other gastritis without bleeding: Secondary | ICD-10-CM

## 2015-08-28 LAB — COMPREHENSIVE METABOLIC PANEL
ALT: 10 U/L (ref 0–55)
AST: 16 U/L (ref 5–34)
Albumin: 3.3 g/dL — ABNORMAL LOW (ref 3.5–5.0)
Alkaline Phosphatase: 90 U/L (ref 40–150)
Anion Gap: 8 mEq/L (ref 3–11)
BUN: 9.9 mg/dL (ref 7.0–26.0)
CO2: 27 mEq/L (ref 22–29)
Calcium: 9.7 mg/dL (ref 8.4–10.4)
Chloride: 105 mEq/L (ref 98–109)
Creatinine: 0.7 mg/dL (ref 0.6–1.1)
EGFR: >90 mL/min/{1.73_m2} (ref >90)
Glucose: 99 mg/dl (ref 70–140)
Potassium: 4.2 mEq/L (ref 3.5–5.1)
Sodium: 140 mEq/L (ref 136–145)
Total Bilirubin: <0.3 mg/dL (ref 0.20–1.20)
Total Protein: 7.3 g/dL (ref 6.4–8.3)

## 2015-08-28 LAB — CBC & DIFF AND RETIC
BASO%: 0.1 % (ref 0.0–2.0)
Basophils Absolute: 0 10*3/uL (ref 0.0–0.1)
EOS%: 1.6 % (ref 0.0–7.0)
Eosinophils Absolute: 0.1 10*3/uL (ref 0.0–0.5)
HCT: 39.8 % (ref 34.8–46.6)
HGB: 12.8 g/dL (ref 11.6–15.9)
Immature Retic Fract: 4.2 % (ref 1.60–10.00)
LYMPH%: 32.3 % (ref 14.0–49.7)
MCH: 29.4 pg (ref 25.1–34.0)
MCHC: 32.2 g/dL (ref 31.5–36.0)
MCV: 91.3 fL (ref 79.5–101.0)
MONO#: 0.7 10*3/uL (ref 0.1–0.9)
MONO%: 10.3 % (ref 0.0–14.0)
NEUT#: 3.7 10*3/uL (ref 1.5–6.5)
NEUT%: 55.7 % (ref 38.4–76.8)
Platelets: 161 10*3/uL (ref 145–400)
RBC: 4.36 10*6/uL (ref 3.70–5.45)
RDW: 12.7 % (ref 11.2–14.5)
Retic %: 1.33 % (ref 0.70–2.10)
Retic Ct Abs: 57.99 10*3/uL (ref 33.70–90.70)
WBC: 6.7 10*3/uL (ref 3.9–10.3)
lymph#: 2.2 10*3/uL (ref 0.9–3.3)

## 2015-08-28 NOTE — Telephone Encounter (Signed)
left msg for pt apt dates & times

## 2015-08-29 NOTE — Progress Notes (Signed)
[name]    HEMATOLOGY/ONCOLOGY CLINIC NOTE  Date of Service: 08/28/2015     Patient Care Team: Bonnie Hacker, MD as PCP - General (Family Medicine) Bonnie Hacker, MD as Resident (Family Medicine)  CHIEF COMPLAINTS/PURPOSE OF CONSULTATION:   F/u for Cholangiocarcinoma s/p completion of concurrent chemo-Radiation.  DIAGNOSIS  Stage IVA (T2a, pN1, M0) Intrahepatic Cholangiocarcinoma  Current Treatment  Plan for adjuvant gemcitabine x 4 months Previous treatment 02/09/15 L lobe of liver partial hepatectomy segments 2, 3, 4a, 4b invasive cholangiocarcinoma, moderately differentiated. Tumor focally extends to the parenchymal margin of resection. Uninvolved liver parenchyma with steatohepatitis and associated periportal and centrilobular pericellular fibrosis. Scattered von Meyenburg complexes. One hepatic artery LN with metastatic cholangiocarcinoma. Fibroadipose tissue and nerve negative for malignancy. Gallbladder negative for malignancy. One LN negative for malignancy. T2aN1 invasive cholangiocarcinoma, moderately differentiated, with positive hepatic parenchymal margin, and 1/2 nodes positive for tumor involvement.  -Concurrent Capecitabine 830m/m2 po BID M-F while on concurrent RT Final date of RT completed 05/25/2015 Patient  had developed grade 2-3 radiation gastritis with some functional gastric outlet obstruction which has now resolved.  Interval History  Ms. MGreenawaltis here for follow her scheduled followup for intrahepatic cholangiocarcinoma.  [facility]  Adjuvant gemcitabine versus observation was recommended.  Patient reports that she chooses to go with adjuvant gemcitabine at this time and prefers to have a port placement.  We discussed the pros and cons of either approach and potential toxicities from gemcitabine.  Her radiation gastritis symptoms have now resolved and she is doing quite well and notes that she is eating well.  He has no  new abdominal or other symptoms suggestive of disease progression at this time.  MEDICAL HISTORY:  Past Medical History  Diagnosis Date  . Obstructive sleep apnea   . HTN, goal below 130/80   . HLD (hyperlipidemia)   . Benign positional vertigo   . Depression   . Anxiety   . Irritable bowel syndrome   . Obesity, Class III, BMI 40-49.9 (morbid obesity) ([place]   . Gout   . Restrictive lung disease   . Echocardiogram findings abnormal, without diagnosis 10/10    10/10: mild pulm HTN, EF 60-65%, mild LVH, moderate aortic regurg  . Early cataracts, bilateral 10/13    Optho, Dr SGershon [place] . COPD [place]   . Osteoarthritis (arthritis due to wear and tear of joints)     also gout  . Cancer [place]    intrahepatic cholangiocarcinoma   SURGICAL HISTORY: Past Surgical History  Procedure Laterality Date  . Partial hysterectomy    . Rotator cuff repair        L rotator cuff repair 11/07-[facility] - 12/7/200  . Tonsillectomy    . Liver biopsy    . Abdominal hysterectomy    . Open partial hepatectomy [83] Left 02/09/15  . Esophagogastroduodenoscopy N/A 06/15/2015    Procedure: ESOPHAGOGASTRODUODENOSCOPY (EGD);  Surgeon: CGatha Mayer MD;  [facility];  Service: Endoscopy;  Laterality: N/A;    SOCIAL HISTORY: Social History   Social History  . Marital Status: Single    Spouse Name: N/A  . Number of Children: 4  . Years of Education: N/A   Occupational History  . unemployed    Social History Main Topics  . Smoking status: Current Some Day Smoker -- 0.50 packs/day for 40 years    Types: Cigarettes  . Smokeless tobacco: Never Used  .  Alcohol Use: 0.0 oz/week    0 Standard drinks or equivalent per week     Comment: occasionally  . Drug Use: Yes    Special: Marijuana  . Sexual Activity: No   Other Topics Concern  . 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 Breast cancer Mother   . Hypertension Mother   . Coronary artery disease Mother   . Diabetes type II Sister   . Pancreatic cancer Sister   . Diabetes type II Mother     ALLERGIES:  is allergic to other.  MEDICATIONS:  Current Outpatient Prescriptions  Medication Sig Dispense Refill  . cholecalciferol (VITAMIN D) 1000 UNITS tablet Take 1 tablet (1,000 Units total) by mouth daily. 30 tablet 11  . diclofenac sodium (VOLTAREN) 1 % GEL Apply 2 g topically 4 (four) times daily as needed. (Patient taking differently: Apply 2 g topically 4 (four) times daily as needed (knee pain, elbow and hands). ) 100 g 4  . fish oil-omega-3 fatty acids 1000 MG capsule Take 1 g by mouth every 30 (thirty) days.     . fluticasone (FLONASE) 50 MCG/ACT nasal spray Place 1 spray into the nose daily as needed for rhinitis.    . Hyprom-Naphaz-Polysorb-Zn Sulf (CLEAR EYES COMPLETE OP) Place 1 drop into both eyes daily as needed (dry eyes).     [name] loratadine (CLARITIN) 10 MG tablet Take 1 tablet (10 mg total) by mouth daily as needed for allergies. 30 tablet 3  . metFORMIN (GLUCOPHAGE) 1000 MG tablet Take 0.5-1 tablets (500-1,000 mg total) by mouth 2 (two) times daily with a meal. Take 572m each morning, 10024meach night    . Multiple Vitamin (MULTIVITAMIN WITH MINERALS) TABS tablet Take 1 tablet by mouth daily.    . Marland KitchenROAIR HFA 108 (90 Base) MCG/ACT inhaler INHALE 2 PUFFS INTO THE LUNGS EVERY 4 HOURS AS NEEDED FOR WHEEZING OR SHORTNESS OF BREATH 1 Inhaler 0  . tiotropium (SPIRIVA) 18 MCG inhalation capsule Place 1 capsule (18 mcg total) into inhaler and inhale as needed (if its hot outside). 30 capsule 5  . traMADol  (ULTRAM) 50 MG tablet Take 1 tablet (50 mg total) by mouth every 6 (six) hours as needed for moderate pain or severe pain. 60 tablet 0  . VITAMIN E PO Take 1 tablet by mouth daily.      No current facility-administered medications for this visit.    REVIEW OF SYSTEMS:    10 Point review of Systems was done is negative except as noted above.  PHYSICAL EXAMINATION: ECOG PERFORMANCE STATUS: 1 - Symptomatic but completely ambulatory Vital signs reviewed in Epic Afebrile temperature 98.6, breathing at 18, pulse 66/m, blood pressure 155/58.  GENERAL:alert, in no acute distress and comfortable SKIN: skin color, texture, turgor are normal, no rashes or significant lesions EYES: normal, conjunctiva are pink and non-injected, sclera clear OROPHARYNX: Mucous membranes moist, oral thrush resolved. NECK: supple, no JVD, thyroid normal size, non-tender, without nodularity LYMPH:  no palpable lymphadenopathy in the cervical, axillary or inguinal LUNGS: clear to auscultation  with normal respiratory effort HEART: regular rate & rhythm,  no murmurs and no lower extremity edema ABDOMEN: abdomen Soft with normoactive bowel sounds, No guarding rigidity or rebound. Musculoskeletal: no cyanosis of digits and no clubbing  PSYCH: alert & oriented x 3 with fluent speech NEURO: no focal motor/sensory deficits  LABORATORY DATA:  . CBC Latest Ref Rng 08/28/2015 08/15/2015 06/16/2015  WBC 3.9 - 10.3 10e3/uL 6.7 6.7 5.3  Hemoglobin 11.6 - 15.9 g/dL 12.8 12.8 10.6(L)  Hematocrit 34.8 - 46.6 % 39.8 38.7 32.7(L)  Platelets 145 - 400 10e3/uL 161 153.0 103(L)   . CMP Latest Ref Rng 08/28/2015 08/15/2015 06/15/2015  Glucose 70 - 140 mg/dl 99 117(H) 138(H)  BUN 7.0 - 26.0 mg/dL 9.9 10 6   Creatinine 0.6 - 1.1 mg/dL 0.7 0.70 0.54  Sodium 136 - 145 mEq/L 140 140 134(L)  Potassium 3.5 - 5.1 mEq/L 4.2 4.2 3.7  Chloride 96 - 112 mEq/L - 103 102  CO2 22 - 29 mEq/L 27 29 23   Calcium 8.4 - 10.4 mg/dL 9.7 9.6 7.7(L)  Total  Protein 6.4 - 8.3 g/dL 7.3 7.2 -  Total Bilirubin 0.20 - 1.20 mg/dL <0.30 0.4 -  Alkaline Phos 40 - 150 U/L 90 90 -  AST 5 - 34 U/L 16 15 -  ALT 0 - 55 U/L 10 12 -   RADIOGRAPHIC STUDIES: I have personally reviewed the radiological images as listed and agreed with the findings in the report. No results found.   ASSESSMENT & PLAN:   70 yo with multiple medical co-morbids  1) Intrahepatic invasive cholangiocarcinoma T2a N1 M0 (Stage IVA) with focal positive parenchymal margins and 1/2 LN positive. CT C/A/P on 04/03/2016 showed no evidence of cholangiocarcinoma local recurrence or metastases at this time. Patient  has completed her adjuvant concurrent chemo-radiation  on 05/25/2015 . She did overall quite well with treatment without any overt prohibitive toxicities. 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  Required treatment with high-dose steroids and dietary modifications.  Symptoms progressive to have resolved.  Currently on PPI for overt symptoms. CT chest abdomen pelvis in March 2017 showed no evidence of disease progression. Patient was seen at [facility] by Dr. Denn[facility] and was given the option of observation versus adjuvant gemcitabine. Plan -Patient seems to have recovered after her concurrent chemotherapy and radiation. -with discussed the options that she was provided at [facility].  She reports that she would like to proceed with adjuvant gemcitabine which showed 1000 mg/m on day 1, day 8 and day 15 every 28 days if tolerated. -I went over the details of this medication and she was given a patient handout.  She will also get chemotherapy counseling. -An informed consent was obtained and orders were placed. -She notes that she has poor veins and would preferably have a port placement this was ordered. -plan of care was discussed in detail with the patient and she is agreeable to proceed.  2) Mild thrombocytopenia previously now  resolved.  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  This seems to have now resolved with steroids and PPI and the patient was able to tolerate Good oral intake   RTC with Dr Irene Limbo in 2 weeks with cbc, cmp one week after starting gemcitabine for a toxicity check on C1D8  Total time spent 40 min. Discussing the current status of her disease, further adjuvant treatment options, details above gemcitabine chemotherapy, pros and cons of  port placement and answering in her multiple questions.   Sullivan Lone MD [place] AAHIVMS [facility] Hematology/Oncology Physician [facility]  (Office):       [phone] (Work cell):  [phone] (Fax):           [phone]

## 2015-08-31 ENCOUNTER — Encounter: Payer: Self-pay | Admitting: *Deleted

## 2015-08-31 ENCOUNTER — Other Ambulatory Visit: Payer: Medicare HMO

## 2015-09-06 ENCOUNTER — Other Ambulatory Visit: Payer: Self-pay | Admitting: General Surgery

## 2015-09-06 ENCOUNTER — Other Ambulatory Visit: Payer: Medicare HMO

## 2015-09-07 ENCOUNTER — Encounter (HOSPITAL_COMMUNITY): Payer: Self-pay

## 2015-09-07 ENCOUNTER — Other Ambulatory Visit: Payer: Self-pay | Admitting: Hematology

## 2015-09-07 ENCOUNTER — Telehealth: Payer: Self-pay | Admitting: *Deleted

## 2015-09-07 ENCOUNTER — Ambulatory Visit (HOSPITAL_COMMUNITY)
Admission: RE | Admit: 2015-09-07 | Discharge: 2015-09-07 | Disposition: A | Discharge status: Home / Self Care | Payer: Medicare HMO | Source: Ambulatory Visit | Attending: Hematology | Admitting: Hematology

## 2015-09-07 DIAGNOSIS — E785 Hyperlipidemia, unspecified: Secondary | ICD-10-CM | POA: Insufficient documentation

## 2015-09-07 DIAGNOSIS — Z79899 Other long term (current) drug therapy: Secondary | ICD-10-CM | POA: Insufficient documentation

## 2015-09-07 DIAGNOSIS — Z833 Family history of diabetes mellitus: Secondary | ICD-10-CM | POA: Insufficient documentation

## 2015-09-07 DIAGNOSIS — Z803 Family history of malignant neoplasm of breast: Secondary | ICD-10-CM | POA: Diagnosis not present

## 2015-09-07 DIAGNOSIS — C221 Intrahepatic bile duct carcinoma: Secondary | ICD-10-CM | POA: Diagnosis present

## 2015-09-07 DIAGNOSIS — J449 Chronic obstructive pulmonary disease, unspecified: Secondary | ICD-10-CM | POA: Diagnosis not present

## 2015-09-07 DIAGNOSIS — E119 Type 2 diabetes mellitus without complications: Secondary | ICD-10-CM | POA: Insufficient documentation

## 2015-09-07 DIAGNOSIS — Z800 Family history of malignant neoplasm of digestive organs: Secondary | ICD-10-CM | POA: Diagnosis not present

## 2015-09-07 DIAGNOSIS — Z7984 Long term (current) use of oral hypoglycemic drugs: Secondary | ICD-10-CM | POA: Diagnosis not present

## 2015-09-07 DIAGNOSIS — F17210 Nicotine dependence, cigarettes, uncomplicated: Secondary | ICD-10-CM | POA: Insufficient documentation

## 2015-09-07 DIAGNOSIS — I10 Essential (primary) hypertension: Secondary | ICD-10-CM | POA: Insufficient documentation

## 2015-09-07 DIAGNOSIS — K219 Gastro-esophageal reflux disease without esophagitis: Secondary | ICD-10-CM | POA: Diagnosis not present

## 2015-09-07 DIAGNOSIS — Z8249 Family history of ischemic heart disease and other diseases of the circulatory system: Secondary | ICD-10-CM | POA: Insufficient documentation

## 2015-09-07 DIAGNOSIS — G4733 Obstructive sleep apnea (adult) (pediatric): Secondary | ICD-10-CM | POA: Diagnosis not present

## 2015-09-07 HISTORY — DX: Gastro-esophageal reflux disease without esophagitis: K21.9

## 2015-09-07 HISTORY — DX: Type 2 diabetes mellitus without complications: E11.9

## 2015-09-07 LAB — BASIC METABOLIC PANEL
Anion gap: 7 (ref 5–15)
BUN: 11 mg/dL (ref 6–20)
CO2: 27 mmol/L (ref 22–32)
Calcium: 9.1 mg/dL (ref 8.9–10.3)
Chloride: 104 mmol/L (ref 101–111)
Creatinine, Ser: 0.71 mg/dL (ref 0.44–1.00)
GFR calc Af Amer: >60 mL/min (ref >60)
GFR calc non Af Amer: >60 mL/min (ref >60)
Glucose, Bld: 120 mg/dL — ABNORMAL HIGH (ref 65–99)
Potassium: 4 mmol/L (ref 3.5–5.1)
Sodium: 138 mmol/L (ref 135–145)

## 2015-09-07 LAB — PROTIME-INR
INR: 1.05 (ref 0.00–1.49)
Prothrombin Time: 13.5 seconds (ref 11.6–15.2)

## 2015-09-07 LAB — CBC
HCT: 39 % (ref 36.0–46.0)
Hemoglobin: 12.3 g/dL (ref 12.0–15.0)
MCH: 28.7 pg (ref 26.0–34.0)
MCHC: 31.5 g/dL (ref 30.0–36.0)
MCV: 91.1 fL (ref 78.0–100.0)
Platelets: 187 10*3/uL (ref 150–400)
RBC: 4.28 MIL/uL (ref 3.87–5.11)
RDW: 12.8 % (ref 11.5–15.5)
WBC: 6.3 10*3/uL (ref 4.0–10.5)

## 2015-09-07 LAB — APTT: aPTT: 29 seconds (ref 24–37)

## 2015-09-07 LAB — GLUCOSE, CAPILLARY: Glucose-Capillary: 130 mg/dL — ABNORMAL HIGH (ref 65–99)

## 2015-09-07 MED ORDER — LIDOCAINE-EPINEPHRINE (PF) 2 %-1:200000 IJ SOLN
INTRAMUSCULAR | Status: AC | PRN
  Administered 2015-09-07: 20 mL

## 2015-09-07 MED ORDER — LIDOCAINE-EPINEPHRINE (PF) 2 %-1:200000 IJ SOLN
INTRAMUSCULAR | Status: AC
  Filled 2015-09-07: qty 20

## 2015-09-07 MED ORDER — LIDOCAINE-PRILOCAINE (BULK) 2.5-2.5 % CREA: 5.0000 mL | TOPICAL_CREAM | Status: DC | PRN

## 2015-09-07 MED ORDER — HEPARIN SOD (PORK) LOCK FLUSH 100 UNIT/ML IV SOLN
INTRAVENOUS | Status: AC
  Filled 2015-09-07: qty 5

## 2015-09-07 MED ORDER — FENTANYL CITRATE (PF) 100 MCG/2ML IJ SOLN
INTRAMUSCULAR | Status: AC | PRN
  Administered 2015-09-07: 50 ug via INTRAVENOUS

## 2015-09-07 MED ORDER — MIDAZOLAM HCL 2 MG/2ML IJ SOLN
INTRAMUSCULAR | Status: AC
  Filled 2015-09-07: qty 6

## 2015-09-07 MED ORDER — CEFAZOLIN SODIUM-DEXTROSE 2-4 GM/100ML-% IV SOLN
2.0000 g | INTRAVENOUS | Status: AC
  Administered 2015-09-07: 2 g via INTRAVENOUS
  Filled 2015-09-07: qty 100

## 2015-09-07 MED ORDER — MIDAZOLAM HCL 2 MG/2ML IJ SOLN
INTRAMUSCULAR | Status: AC | PRN
Start: 2015-09-07 — End: 2015-09-07
  Administered 2015-09-07 (×3): 1 mg via INTRAVENOUS

## 2015-09-07 MED ORDER — FENTANYL CITRATE (PF) 100 MCG/2ML IJ SOLN
INTRAMUSCULAR | Status: AC
  Filled 2015-09-07: qty 4

## 2015-09-07 MED ORDER — HEPARIN SOD (PORK) LOCK FLUSH 100 UNIT/ML IV SOLN
INTRAVENOUS | Status: AC | PRN
  Administered 2015-09-07: 500 [IU]

## 2015-09-07 MED ORDER — SODIUM CHLORIDE 0.9 % IV SOLN
INTRAVENOUS | Status: DC
  Administered 2015-09-07: 09:00:00 via INTRAVENOUS

## 2015-09-07 MED FILL — LIDOCAINE-PRILOCAINE CREAM: 2.5-2.5 | 30 days supply | Qty: 30 | Fill #0

## 2015-09-07 NOTE — Procedures (Signed)
R IJ Port cathter placement with US and fluoroscopy No complication No blood loss. See complete dictation in [facility] PACS.  

## 2015-09-07 NOTE — H&P (Signed)
Chief Complaint: cholangiocarcinoma  Referring Physician:Dr. Sullivan Lone  Supervising Physician: Arne Cleveland  Patient Status: Out-pt  HPI: Bonnie Peterson is an 70 y.o. female who has a history of cholangiocarcinoma.  She has had several rounds of treatment before including chemotherapy and radiation.  She is followed by Dr. Irene Limbo and someone at [facility] as well.  She has agreed to undergo another round of chemotherapy.  Therefore, she will need a PAC.  She denies any sick symptoms except she has seasonal allergies.  Past Medical History:  Past Medical History  Diagnosis Date  . HTN, goal below 130/80   . HLD (hyperlipidemia)   . Benign positional vertigo   . Depression   . Anxiety   . Irritable bowel syndrome   . Obesity, Class III, BMI 40-49.9 (morbid obesity) ([place])   . Gout   . Restrictive lung disease   . Echocardiogram findings abnormal, without diagnosis 10/10    10/10: mild pulm HTN, EF 60-65%, mild LVH, moderate aortic regurg  . Early cataracts, bilateral 10/13    Optho, Dr Gershon Crane  . COPD (chronic obstructive pulmonary disease) ([place])   . Osteoarthritis (arthritis due to wear and tear of joints)     also gout  . Diabetes mellitus without complication ([place])   . Obstructive sleep apnea     wears cpap  . GERD (gastroesophageal reflux disease)     I have "acid reflux"  . Cancer [facility]) 02/09/15    intrahepatic cholangiocarcinoma    Past Surgical History:  Past Surgical History  Procedure Laterality Date  . Partial hysterectomy    . Rotator cuff repair        L rotator cuff repair 11/07-[place] - 12/7/200  . Tonsillectomy    . Liver biopsy    . Abdominal hysterectomy    . Open partial hepatectomy [83] Left 02/09/15  . Esophagogastroduodenoscopy N/A 06/15/2015    Procedure: ESOPHAGOGASTRODUODENOSCOPY (EGD);  Surgeon: Gatha Mayer, MD;  Location: [facility];  Service: Endoscopy;  Laterality: N/A;    Family History:  Family History  Problem Relation Age of  Onset  . Breast cancer Mother   . Hypertension Mother   . Coronary artery disease Mother   . Diabetes type II Sister   . Pancreatic cancer Sister   . Diabetes type II Mother     Social History:  reports that she has been smoking Cigarettes.  She has a 20 pack-year smoking history. She has never used smokeless tobacco. She reports that she drinks alcohol. She reports that she uses illicit drugs (Marijuana).  Allergies:  Allergies  Allergen Reactions  . Other     diprovan- Anesthesia for MRI--made her "breathe funny"    Medications:   Medication List    ASK your doctor about these medications        cholecalciferol 1000 units tablet  Commonly known as:  VITAMIN D  Take 1 tablet (1,000 Units total) by mouth daily.     CLEAR EYES COMPLETE OP  Place 1 drop into both eyes daily as needed (dry eyes).     diclofenac sodium 1 % Gel  Commonly known as:  VOLTAREN  Apply 2 g topically 4 (four) times daily as needed.     fexofenadine 30 MG tablet  Commonly known as:  ALLEGRA  Take 30 mg by mouth 2 (two) times daily.     fish oil-omega-3 fatty acids 1000 MG capsule  Take 1 g by mouth every 30 (thirty) days.  fluticasone 50 MCG/ACT nasal spray  Commonly known as:  FLONASE  Place 1 spray into the nose daily as needed for rhinitis.     loratadine 10 MG tablet  Commonly known as:  CLARITIN  Take 1 tablet (10 mg total) by mouth daily as needed for allergies.     metFORMIN 1000 MG tablet  Commonly known as:  GLUCOPHAGE  Take 0.5-1 tablets (500-1,000 mg total) by mouth 2 (two) times daily with a meal. Take 500mg  each morning, 1000mg  each night     multivitamin with minerals Tabs tablet  Take 1 tablet by mouth daily.     PROAIR HFA 108 (90 Base) MCG/ACT inhaler  Generic drug:  albuterol  INHALE 2 PUFFS INTO THE LUNGS EVERY 4 HOURS AS NEEDED FOR WHEEZING OR SHORTNESS OF BREATH     tiotropium 18 MCG inhalation capsule  Commonly known as:  SPIRIVA  Place 1 capsule (18 mcg  total) into inhaler and inhale as needed (if its hot outside).     traMADol 50 MG tablet  Commonly known as:  ULTRAM  Take 1 tablet (50 mg total) by mouth every 6 (six) hours as needed for moderate pain or severe pain.     VITAMIN E PO  Take 1 tablet by mouth daily.        Please HPI for pertinent positives, otherwise complete 10 system ROS negative.  Mallampati Score: MD Evaluation Airway: WNL Heart: WNL Abdomen: WNL Chest/ Lungs: WNL ASA  Classification: 3 Mallampati/Airway Score: Two  Physical Exam: BP 121/64 mmHg  Pulse 69  Temp(Src) 97.7 F (36.5 C) (Oral)  Resp 18  Wt 167 lb 12.8 oz (76.114 kg)  SpO2 98% Body mass index is 29.73 kg/(m^2). General: pleasant, obese, black female who is laying in bed in NAD HEENT: head is normocephalic, atraumatic.  Sclera are noninjected.  PERRL.  Ears and nose without any masses or lesions.  Mouth is pink and moist Heart: regular, rate, and rhythm.  Normal s1,s2. No obvious murmurs, gallops, or rubs noted.  Palpable radial and pedal pulses bilaterally Lungs: CTAB, no wheezes, rhonchi, or rales noted.  Respiratory effort nonlabored Abd: soft, minimally tender just left lateral to her umbilicus, ND, +BS, no masses, hernias, or organomegaly MS: all 4 extremities are symmetrical with no cyanosis, clubbing, or edema.  Psych: A&Ox3 with an appropriate affect.   Labs: Results for orders placed or performed during the hospital encounter of 09/07/15 (from the past 48 hour(s))  APTT     Status: None   Collection Time: 09/07/15  8:50 AM  Result Value Ref Range   aPTT 29 24 - 37 seconds  CBC     Status: None   Collection Time: 09/07/15  8:50 AM  Result Value Ref Range   WBC 6.3 4.0 - 10.5 K/uL   RBC 4.28 3.87 - 5.11 MIL/uL   Hemoglobin 12.3 12.0 - 15.0 g/dL   HCT 39.0 36.0 - 46.0 %   MCV 91.1 78.0 - 100.0 fL   MCH 28.7 26.0 - 34.0 pg   MCHC 31.5 30.0 - 36.0 g/dL   RDW 12.8 11.5 - 15.5 %   Platelets 187 150 - 400 K/uL  Protime-INR      Status: None   Collection Time: 09/07/15  8:50 AM  Result Value Ref Range   Prothrombin Time 13.5 11.6 - 15.2 seconds   INR 1.05 0.00 - 1.49    Imaging: No results found.  Assessment/Plan 1. Cholangiocarcinoma -labs and vitals have been reviewed -  will proceed with PAC placement today -Risks and Benefits discussed with the patient including, but not limited to bleeding, infection, pneumothorax, or fibrin sheath development and need for additional procedures. All of the patient's questions were answered, patient is agreeable to proceed. Consent signed and in chart.   Thank you for this interesting consult.  I greatly enjoyed meeting Bonnie Peterson and look forward to participating in their care.  A copy of this report was sent to the requesting provider on this date.  Electronically Signed: Henreitta Cea 09/07/2015, 9:22 AM   I spent a total of  30 Minutes  in face to face in clinical consultation, greater than 50% of which was counseling/coordinating care for needs PAC due to cholangiocarcinoma

## 2015-09-07 NOTE — Telephone Encounter (Signed)
"  Bonnie Peterson is a patient of Dr. Grier Mitts.  She just had a port-a-cath placed.  We're at the pharmacy and they do not have the order for the cream she needs tomorrow."  Will send order for Emla Cream to [facility].

## 2015-09-07 NOTE — Discharge Instructions (Signed)
Implanted Port Home Guide °An implanted port is a type of central line that is placed under the skin. Central lines are used to provide IV access when treatment or nutrition needs to be given through a person's veins. Implanted ports are used for long-term IV access. An implanted port may be placed because:  °· You need IV medicine that would be irritating to the small veins in your hands or arms.   °· You need long-term IV medicines, such as antibiotics.   °· You need IV nutrition for a long period.   °· You need frequent blood draws for lab tests.   °· You need dialysis.   °Implanted ports are usually placed in the chest area, but they can also be placed in the upper arm, the abdomen, or the leg. An implanted port has two main parts:  °· Reservoir. The reservoir is round and will appear as a small, raised area under your skin. The reservoir is the part where a needle is inserted to give medicines or draw blood.   °· Catheter. The catheter is a thin, flexible tube that extends from the reservoir. The catheter is placed into a large vein. Medicine that is inserted into the reservoir goes into the catheter and then into the vein.   °HOW WILL I CARE FOR MY INCISION SITE? °Do not get the incision site wet. Bathe or shower as directed by your health care provider.  °HOW IS MY PORT ACCESSED? °Special steps must be taken to access the port:  °· Before the port is accessed, a numbing cream can be placed on the skin. This helps numb the skin over the port site.   °· Your health care provider uses a sterile technique to access the port. °· Your health care provider must put on a mask and sterile gloves. °· The skin over your port is cleaned carefully with an antiseptic and allowed to dry. °· The port is gently pinched between sterile gloves, and a needle is inserted into the port. °· Only "non-coring" port needles should be used to access the port. Once the port is accessed, a blood return should be checked. This helps  ensure that the port is in the vein and is not clogged.   °· If your port needs to remain accessed for a constant infusion, a clear (transparent) bandage will be placed over the needle site. The bandage and needle will need to be changed every week, or as directed by your health care provider.   °· Keep the bandage covering the needle clean and dry. Do not get it wet. Follow your health care provider's instructions on how to take a shower or bath while the port is accessed.   °· If your port does not need to stay accessed, no bandage is needed over the port.   °WHAT IS FLUSHING? °Flushing helps keep the port from getting clogged. Follow your health care provider's instructions on how and when to flush the port. Ports are usually flushed with saline solution or a medicine called heparin. The need for flushing will depend on how the port is used.  °· If the port is used for intermittent medicines or blood draws, the port will need to be flushed:   °· After medicines have been given.   °· After blood has been drawn.   °· As part of routine maintenance.   °· If a constant infusion is running, the port may not need to be flushed.   °HOW LONG WILL MY PORT STAY IMPLANTED? °The port can stay in for as long as your health care   provider thinks it is needed. When it is time for the port to come out, surgery will be done to remove it. The procedure is similar to the one performed when the port was put in.  °WHEN SHOULD I SEEK IMMEDIATE MEDICAL CARE? °When you have an implanted port, you should seek immediate medical care if:  °· You notice a bad smell coming from the incision site.   °· You have swelling, redness, or drainage at the incision site.   °· You have more swelling or pain at the port site or the surrounding area.   °· You have a fever that is not controlled with medicine. °  °This information is not intended to replace advice given to you by your health care provider. Make sure you discuss any questions you have with  your health care provider. °  °Document Released: 03/25/2005 Document Revised: 01/13/2013 Document Reviewed: 11/30/2012 °Elsevier Interactive Patient Education ©2016 Elsevier Inc. ° °Moderate Conscious Sedation, Adult, Care After °Refer to this sheet in the next few weeks. These instructions provide you with information on caring for yourself after your procedure. Your health care provider may also give you more specific instructions. Your treatment has been planned according to current medical practices, but problems sometimes occur. Call your health care provider if you have any problems or questions after your procedure. °WHAT TO EXPECT AFTER THE PROCEDURE  °After your procedure: °· You may feel sleepy, clumsy, and have poor balance for several hours. °· Vomiting may occur if you eat too soon after the procedure. °HOME CARE INSTRUCTIONS °· Do not participate in any activities where you could become injured for at least 24 hours. Do not: °¨ Drive. °¨ Swim. °¨ Ride a bicycle. °¨ Operate heavy machinery. °¨ Cook. °¨ Use power tools. °¨ Climb ladders. °¨ Work from a high place. °· Do not make important decisions or sign legal documents until you are improved. °· If you vomit, drink water, juice, or soup when you can drink without vomiting. Make sure you have little or no nausea before eating solid foods. °· Only take over-the-counter or prescription medicines for pain, discomfort, or fever as directed by your health care provider. °· Make sure you and your family fully understand everything about the medicines given to you, including what side effects may occur. °· You should not drink alcohol, take sleeping pills, or take medicines that cause drowsiness for at least 24 hours. °· If you smoke, do not smoke without supervision. °· If you are feeling better, you may resume normal activities 24 hours after you were sedated. °· Keep all appointments with your health care provider. °SEEK MEDICAL CARE IF: °· Your skin is  pale or bluish in color. °· You continue to feel nauseous or vomit. °· Your pain is getting worse and is not helped by medicine. °· You have bleeding or swelling. °· You are still sleepy or feeling clumsy after 24 hours. °SEEK IMMEDIATE MEDICAL CARE IF: °· You develop a rash. °· You have difficulty breathing. °· You develop any type of allergic problem. °· You have a fever. °MAKE SURE YOU: °· Understand these instructions. °· Will watch your condition. °· Will get help right away if you are not doing well or get worse. °  °This information is not intended to replace advice given to you by your health care provider. Make sure you discuss any questions you have with your health care provider. °  °Document Released: 01/13/2013 Document Revised: 04/15/2014 Document Reviewed: 01/13/2013 °Elsevier Interactive Patient   Education ©2016 Elsevier Inc. ° °

## 2015-09-08 ENCOUNTER — Other Ambulatory Visit (HOSPITAL_BASED_OUTPATIENT_CLINIC_OR_DEPARTMENT_OTHER): Payer: Medicare HMO

## 2015-09-08 ENCOUNTER — Ambulatory Visit (HOSPITAL_BASED_OUTPATIENT_CLINIC_OR_DEPARTMENT_OTHER): Payer: Medicare HMO

## 2015-09-08 DIAGNOSIS — Z5111 Encounter for antineoplastic chemotherapy: Secondary | ICD-10-CM | POA: Diagnosis not present

## 2015-09-08 DIAGNOSIS — C221 Intrahepatic bile duct carcinoma: Secondary | ICD-10-CM

## 2015-09-08 LAB — CBC & DIFF AND RETIC
BASO%: 0.2 % (ref 0.0–2.0)
Basophils Absolute: 0 10*3/uL (ref 0.0–0.1)
EOS%: 1.3 % (ref 0.0–7.0)
Eosinophils Absolute: 0.1 10*3/uL (ref 0.0–0.5)
HCT: 40.2 % (ref 34.8–46.6)
HGB: 12.8 g/dL (ref 11.6–15.9)
Immature Retic Fract: 3.4 % (ref 1.60–10.00)
LYMPH%: 27.5 % (ref 14.0–49.7)
MCH: 29 pg (ref 25.1–34.0)
MCHC: 31.8 g/dL (ref 31.5–36.0)
MCV: 91.2 fL (ref 79.5–101.0)
MONO#: 0.4 10*3/uL (ref 0.1–0.9)
MONO%: 7.4 % (ref 0.0–14.0)
NEUT#: 3.4 10*3/uL (ref 1.5–6.5)
NEUT%: 63.6 % (ref 38.4–76.8)
Platelets: 173 10*3/uL (ref 145–400)
RBC: 4.41 10*6/uL (ref 3.70–5.45)
RDW: 12.5 % (ref 11.2–14.5)
Retic %: 1.36 % (ref 0.70–2.10)
Retic Ct Abs: 59.98 10*3/uL (ref 33.70–90.70)
WBC: 5.3 10*3/uL (ref 3.9–10.3)
lymph#: 1.5 10*3/uL (ref 0.9–3.3)

## 2015-09-08 LAB — COMPREHENSIVE METABOLIC PANEL
ALT: <9 U/L (ref 0–55)
AST: 15 U/L (ref 5–34)
Albumin: 3.2 g/dL — ABNORMAL LOW (ref 3.5–5.0)
Alkaline Phosphatase: 94 U/L (ref 40–150)
Anion Gap: 8 mEq/L (ref 3–11)
BUN: 7.9 mg/dL (ref 7.0–26.0)
CO2: 26 mEq/L (ref 22–29)
Calcium: 9.3 mg/dL (ref 8.4–10.4)
Chloride: 105 mEq/L (ref 98–109)
Creatinine: 0.8 mg/dL (ref 0.6–1.1)
EGFR: >90 mL/min/{1.73_m2} (ref >90)
Glucose: 139 mg/dl (ref 70–140)
Potassium: 4.1 mEq/L (ref 3.5–5.1)
Sodium: 139 mEq/L (ref 136–145)
Total Bilirubin: 0.33 mg/dL (ref 0.20–1.20)
Total Protein: 7.4 g/dL (ref 6.4–8.3)

## 2015-09-08 MED ORDER — SODIUM CHLORIDE 0.9% FLUSH
10.0000 mL | INTRAVENOUS | Status: DC | PRN
  Administered 2015-09-08: 10 mL
  Filled 2015-09-08: qty 10

## 2015-09-08 MED ORDER — PROCHLORPERAZINE MALEATE 10 MG PO TABS
10.0000 mg | ORAL_TABLET | Freq: Once | ORAL | Status: AC
  Administered 2015-09-08: 10 mg via ORAL

## 2015-09-08 MED ORDER — SODIUM CHLORIDE 0.9 % IV SOLN
Freq: Once | INTRAVENOUS | Status: AC
  Administered 2015-09-08: 11:00:00 via INTRAVENOUS

## 2015-09-08 MED ORDER — SODIUM CHLORIDE 0.9 % IV SOLN
1000.0000 mg/m2 | Freq: Once | INTRAVENOUS | Status: AC
  Administered 2015-09-08: 1862 mg via INTRAVENOUS
  Filled 2015-09-08: qty 48.97

## 2015-09-08 MED ORDER — HEPARIN SOD (PORK) LOCK FLUSH 100 UNIT/ML IV SOLN
500.0000 [IU] | Freq: Once | INTRAVENOUS | Status: AC | PRN
  Administered 2015-09-08: 500 [IU]
  Filled 2015-09-08: qty 5

## 2015-09-08 MED ORDER — PROCHLORPERAZINE MALEATE 10 MG PO TABS
ORAL_TABLET | ORAL | Status: AC
  Filled 2015-09-08: qty 1

## 2015-09-08 NOTE — Patient Instructions (Signed)
Gemcitabine injection  What is this medicine?  GEMCITABINE (jem SIT a been) is a chemotherapy drug. This medicine is used to treat many types of cancer like breast cancer, lung cancer, pancreatic cancer, and ovarian cancer.  This medicine may be used for other purposes; ask your health care provider or pharmacist if you have questions.  What should I tell my health care provider before I take this medicine?  They need to know if you have any of these conditions:  -blood disorders  -infection  -kidney disease  -liver disease  -recent or ongoing radiation therapy  -an unusual or allergic reaction to gemcitabine, other chemotherapy, other medicines, foods, dyes, or preservatives  -pregnant or trying to get pregnant  -breast-feeding  How should I use this medicine?  This drug is given as an infusion into a vein. It is administered in a hospital or clinic by a specially trained health care professional.  Talk to your pediatrician regarding the use of this medicine in children. Special care may be needed.  Overdosage: If you think you have taken too much of this medicine contact a poison control center or emergency room at once.  NOTE: This medicine is only for you. Do not share this medicine with others.  What if I miss a dose?  It is important not to miss your dose. Call your doctor or health care professional if you are unable to keep an appointment.  What may interact with this medicine?  -medicines to increase blood counts like filgrastim, pegfilgrastim, sargramostim  -some other chemotherapy drugs like cisplatin  -vaccines  Talk to your doctor or health care professional before taking any of these medicines:  -acetaminophen  -aspirin  -ibuprofen  -ketoprofen  -naproxen  This list may not describe all possible interactions. Give your health care provider a list of all the medicines, herbs, non-prescription drugs, or dietary supplements you use. Also tell them if you smoke, drink alcohol, or use illegal drugs. Some  items may interact with your medicine.  What should I watch for while using this medicine?  Visit your doctor for checks on your progress. This drug may make you feel generally unwell. This is not uncommon, as chemotherapy can affect healthy cells as well as cancer cells. Report any side effects. Continue your course of treatment even though you feel ill unless your doctor tells you to stop.  In some cases, you may be given additional medicines to help with side effects. Follow all directions for their use.  Call your doctor or health care professional for advice if you get a fever, chills or sore throat, or other symptoms of a cold or flu. Do not treat yourself. This drug decreases your body's ability to fight infections. Try to avoid being around people who are sick.  This medicine may increase your risk to bruise or bleed. Call your doctor or health care professional if you notice any unusual bleeding.  Be careful brushing and flossing your teeth or using a toothpick because you may get an infection or bleed more easily. If you have any dental work done, tell your dentist you are receiving this medicine.  Avoid taking products that contain aspirin, acetaminophen, ibuprofen, naproxen, or ketoprofen unless instructed by your doctor. These medicines may hide a fever.  Women should inform their doctor if they wish to become pregnant or think they might be pregnant. There is a potential for serious side effects to an unborn child. Talk to your health care professional or pharmacist for   reactions like skin rash, itching or hives, swelling of the face, lips, or tongue -low blood counts - this medicine may decrease the number of white blood cells, red blood cells and  platelets. You may be at increased risk for infections and bleeding. -signs of infection - fever or chills, cough, sore throat, pain or difficulty passing urine -signs of decreased platelets or bleeding - bruising, pinpoint red spots on the skin, black, tarry stools, blood in the urine -signs of decreased red blood cells - unusually weak or tired, fainting spells, lightheadedness -breathing problems -chest pain -mouth sores -nausea and vomiting -pain, swelling, redness at site where injected -pain, tingling, numbness in the hands or feet -stomach pain -swelling of ankles, feet, hands -unusual bleeding Side effects that usually do not require medical attention (report to your doctor or health care professional if they continue or are bothersome): -constipation -diarrhea -hair loss -loss of appetite -stomach upset This list may not describe all possible side effects. Call your doctor for medical advice about side effects. You may report side effects to FDA at 1-800-FDA-1088. Where should I keep my medicine? This drug is given in a hospital or clinic and will not be stored at home. NOTE: This sheet is a summary. It may not cover all possible information. If you have questions about this medicine, talk to your doctor, pharmacist, or health care provider.    2016, Elsevier/Gold Standard. (2007-08-04 18:45:54)  [facility] Discharge Instructions for Patients Receiving Chemotherapy  Today you received the following chemotherapy agents:  Gemzar.  To help prevent nausea and vomiting after your treatment, we encourage you to take your nausea medication as prescribed.   If you develop nausea and vomiting that is not controlled by your nausea medication, call the clinic.   BELOW ARE SYMPTOMS THAT SHOULD BE REPORTED IMMEDIATELY:  *FEVER GREATER THAN 100.5 F  *CHILLS WITH OR WITHOUT FEVER  NAUSEA AND VOMITING THAT IS NOT CONTROLLED WITH YOUR NAUSEA MEDICATION  *UNUSUAL  SHORTNESS OF BREATH  *UNUSUAL BRUISING OR BLEEDING  TENDERNESS IN MOUTH AND THROAT WITH OR WITHOUT PRESENCE OF ULCERS  *URINARY PROBLEMS  *BOWEL PROBLEMS  UNUSUAL RASH Items with * indicate a potential emergency and should be followed up as soon as possible.  Feel free to call the clinic you have any questions or concerns. The clinic phone number is [phone].  Please show the [place] at check-in to the Emergency Department and triage nurse.

## 2015-09-12 ENCOUNTER — Other Ambulatory Visit: Payer: Medicare HMO

## 2015-09-12 ENCOUNTER — Ambulatory Visit: Payer: Medicare HMO | Admitting: Hematology

## 2015-09-15 ENCOUNTER — Ambulatory Visit (HOSPITAL_BASED_OUTPATIENT_CLINIC_OR_DEPARTMENT_OTHER): Payer: Medicare HMO

## 2015-09-15 ENCOUNTER — Ambulatory Visit (HOSPITAL_BASED_OUTPATIENT_CLINIC_OR_DEPARTMENT_OTHER): Payer: Medicare HMO | Admitting: Hematology

## 2015-09-15 ENCOUNTER — Encounter: Payer: Self-pay | Admitting: Hematology

## 2015-09-15 ENCOUNTER — Other Ambulatory Visit (HOSPITAL_BASED_OUTPATIENT_CLINIC_OR_DEPARTMENT_OTHER): Payer: Medicare HMO

## 2015-09-15 VITALS — BP 135/55 | HR 78 | Temp 98.3°F | Resp 18 | Wt 162.5 lb

## 2015-09-15 DIAGNOSIS — R630 Anorexia: Secondary | ICD-10-CM | POA: Diagnosis not present

## 2015-09-15 DIAGNOSIS — Z5111 Encounter for antineoplastic chemotherapy: Secondary | ICD-10-CM

## 2015-09-15 DIAGNOSIS — R112 Nausea with vomiting, unspecified: Secondary | ICD-10-CM

## 2015-09-15 DIAGNOSIS — J209 Acute bronchitis, unspecified: Secondary | ICD-10-CM

## 2015-09-15 DIAGNOSIS — C221 Intrahepatic bile duct carcinoma: Secondary | ICD-10-CM

## 2015-09-15 DIAGNOSIS — R110 Nausea: Secondary | ICD-10-CM

## 2015-09-15 DIAGNOSIS — D6959 Other secondary thrombocytopenia: Secondary | ICD-10-CM

## 2015-09-15 LAB — COMPREHENSIVE METABOLIC PANEL
ALT: 20 U/L (ref 0–55)
AST: 23 U/L (ref 5–34)
Albumin: 3.4 g/dL — ABNORMAL LOW (ref 3.5–5.0)
Alkaline Phosphatase: 88 U/L (ref 40–150)
Anion Gap: 9 mEq/L (ref 3–11)
BUN: 10 mg/dL (ref 7.0–26.0)
CO2: 26 mEq/L (ref 22–29)
Calcium: 9.7 mg/dL (ref 8.4–10.4)
Chloride: 105 mEq/L (ref 98–109)
Creatinine: 0.8 mg/dL (ref 0.6–1.1)
EGFR: 87 mL/min/{1.73_m2} — ABNORMAL LOW (ref >90)
Glucose: 138 mg/dl (ref 70–140)
Potassium: 4.1 mEq/L (ref 3.5–5.1)
Sodium: 140 mEq/L (ref 136–145)
Total Bilirubin: 0.3 mg/dL (ref 0.20–1.20)
Total Protein: 7.7 g/dL (ref 6.4–8.3)

## 2015-09-15 LAB — CBC & DIFF AND RETIC
BASO%: 0.3 % (ref 0.0–2.0)
Basophils Absolute: 0 10*3/uL (ref 0.0–0.1)
EOS%: 0.3 % (ref 0.0–7.0)
Eosinophils Absolute: 0 10*3/uL (ref 0.0–0.5)
HCT: 38.4 % (ref 34.8–46.6)
HGB: 12.4 g/dL (ref 11.6–15.9)
Immature Retic Fract: 2.4 % (ref 1.60–10.00)
LYMPH%: 43.7 % (ref 14.0–49.7)
MCH: 28.9 pg (ref 25.1–34.0)
MCHC: 32.3 g/dL (ref 31.5–36.0)
MCV: 89.5 fL (ref 79.5–101.0)
MONO#: 0.2 10*3/uL (ref 0.1–0.9)
MONO%: 4.5 % (ref 0.0–14.0)
NEUT#: 1.7 10*3/uL (ref 1.5–6.5)
NEUT%: 51.2 % (ref 38.4–76.8)
Platelets: 123 10*3/uL — ABNORMAL LOW (ref 145–400)
RBC: 4.29 10*6/uL (ref 3.70–5.45)
RDW: 12.5 % (ref 11.2–14.5)
Retic %: <0.38 % — ABNORMAL LOW (ref 0.5–1.5)
Retic Ct Abs: 9.87 10*3/uL — ABNORMAL LOW (ref 33.70–90.70)
WBC: 3.3 10*3/uL — ABNORMAL LOW (ref 3.9–10.3)
lymph#: 1.5 10*3/uL (ref 0.9–3.3)
nRBC: 0 % (ref 0–0)

## 2015-09-15 MED ORDER — SODIUM CHLORIDE 0.9 % IV SOLN
1000.0000 mg/m2 | Freq: Once | INTRAVENOUS | Status: AC
  Administered 2015-09-15: 1862 mg via INTRAVENOUS
  Filled 2015-09-15: qty 48.97

## 2015-09-15 MED ORDER — AZITHROMYCIN 250 MG PO TABS: ORAL_TABLET | ORAL | Status: DC

## 2015-09-15 MED ORDER — DRONABINOL 2.5 MG PO CAPS: 2.5000 mg | ORAL_CAPSULE | Freq: Two times a day (BID) | ORAL | Status: DC

## 2015-09-15 MED ORDER — SODIUM CHLORIDE 0.9% FLUSH
10.0000 mL | INTRAVENOUS | Status: DC | PRN
  Administered 2015-09-15: 10 mL
  Filled 2015-09-15: qty 10

## 2015-09-15 MED ORDER — PROCHLORPERAZINE MALEATE 10 MG PO TABS
10.0000 mg | ORAL_TABLET | Freq: Once | ORAL | Status: AC
  Administered 2015-09-15: 10 mg via ORAL

## 2015-09-15 MED ORDER — PROCHLORPERAZINE MALEATE 10 MG PO TABS
ORAL_TABLET | ORAL | Status: AC
  Filled 2015-09-15: qty 1

## 2015-09-15 MED ORDER — SODIUM CHLORIDE 0.9 % IV SOLN
Freq: Once | INTRAVENOUS | Status: AC
  Administered 2015-09-15: 12:00:00 via INTRAVENOUS

## 2015-09-15 MED ORDER — HEPARIN SOD (PORK) LOCK FLUSH 100 UNIT/ML IV SOLN
500.0000 [IU] | Freq: Once | INTRAVENOUS | Status: AC | PRN
  Administered 2015-09-15: 500 [IU]
  Filled 2015-09-15: qty 5

## 2015-09-15 MED FILL — AZITHROMYCIN 250 MG TABLET: 250 | 5 days supply | Qty: 6 | Fill #0

## 2015-09-15 MED FILL — DRONABINOL 2.5 MG CAPSULE: 2.5 | 30 days supply | Qty: 60 | Fill #0

## 2015-09-15 NOTE — Patient Instructions (Signed)
Gemcitabine injection  What is this medicine?  GEMCITABINE (jem SIT a been) is a chemotherapy drug. This medicine is used to treat many types of cancer like breast cancer, lung cancer, pancreatic cancer, and ovarian cancer.  This medicine may be used for other purposes; ask your health care provider or pharmacist if you have questions.  What should I tell my health care provider before I take this medicine?  They need to know if you have any of these conditions:  -blood disorders  -infection  -kidney disease  -liver disease  -recent or ongoing radiation therapy  -an unusual or allergic reaction to gemcitabine, other chemotherapy, other medicines, foods, dyes, or preservatives  -pregnant or trying to get pregnant  -breast-feeding  How should I use this medicine?  This drug is given as an infusion into a vein. It is administered in a hospital or clinic by a specially trained health care professional.  Talk to your pediatrician regarding the use of this medicine in children. Special care may be needed.  Overdosage: If you think you have taken too much of this medicine contact a poison control center or emergency room at once.  NOTE: This medicine is only for you. Do not share this medicine with others.  What if I miss a dose?  It is important not to miss your dose. Call your doctor or health care professional if you are unable to keep an appointment.  What may interact with this medicine?  -medicines to increase blood counts like filgrastim, pegfilgrastim, sargramostim  -some other chemotherapy drugs like cisplatin  -vaccines  Talk to your doctor or health care professional before taking any of these medicines:  -acetaminophen  -aspirin  -ibuprofen  -ketoprofen  -naproxen  This list may not describe all possible interactions. Give your health care provider a list of all the medicines, herbs, non-prescription drugs, or dietary supplements you use. Also tell them if you smoke, drink alcohol, or use illegal drugs. Some  items may interact with your medicine.  What should I watch for while using this medicine?  Visit your doctor for checks on your progress. This drug may make you feel generally unwell. This is not uncommon, as chemotherapy can affect healthy cells as well as cancer cells. Report any side effects. Continue your course of treatment even though you feel ill unless your doctor tells you to stop.  In some cases, you may be given additional medicines to help with side effects. Follow all directions for their use.  Call your doctor or health care professional for advice if you get a fever, chills or sore throat, or other symptoms of a cold or flu. Do not treat yourself. This drug decreases your body's ability to fight infections. Try to avoid being around people who are sick.  This medicine may increase your risk to bruise or bleed. Call your doctor or health care professional if you notice any unusual bleeding.  Be careful brushing and flossing your teeth or using a toothpick because you may get an infection or bleed more easily. If you have any dental work done, tell your dentist you are receiving this medicine.  Avoid taking products that contain aspirin, acetaminophen, ibuprofen, naproxen, or ketoprofen unless instructed by your doctor. These medicines may hide a fever.  Women should inform their doctor if they wish to become pregnant or think they might be pregnant. There is a potential for serious side effects to an unborn child. Talk to your health care professional or pharmacist for   reactions like skin rash, itching or hives, swelling of the face, lips, or tongue -low blood counts - this medicine may decrease the number of white blood cells, red blood cells and  platelets. You may be at increased risk for infections and bleeding. -signs of infection - fever or chills, cough, sore throat, pain or difficulty passing urine -signs of decreased platelets or bleeding - bruising, pinpoint red spots on the skin, black, tarry stools, blood in the urine -signs of decreased red blood cells - unusually weak or tired, fainting spells, lightheadedness -breathing problems -chest pain -mouth sores -nausea and vomiting -pain, swelling, redness at site where injected -pain, tingling, numbness in the hands or feet -stomach pain -swelling of ankles, feet, hands -unusual bleeding Side effects that usually do not require medical attention (report to your doctor or health care professional if they continue or are bothersome): -constipation -diarrhea -hair loss -loss of appetite -stomach upset This list may not describe all possible side effects. Call your doctor for medical advice about side effects. You may report side effects to FDA at 1-800-FDA-1088. Where should I keep my medicine? This drug is given in a hospital or clinic and will not be stored at home. NOTE: This sheet is a summary. It may not cover all possible information. If you have questions about this medicine, talk to your doctor, pharmacist, or health care provider.    2016, Elsevier/Gold Standard. (2007-08-04 18:45:54)  [facility] Discharge Instructions for Patients Receiving Chemotherapy  Today you received the following chemotherapy agents:  Gemzar.  To help prevent nausea and vomiting after your treatment, we encourage you to take your nausea medication as prescribed.   If you develop nausea and vomiting that is not controlled by your nausea medication, call the clinic.   BELOW ARE SYMPTOMS THAT SHOULD BE REPORTED IMMEDIATELY:  *FEVER GREATER THAN 100.5 F  *CHILLS WITH OR WITHOUT FEVER  NAUSEA AND VOMITING THAT IS NOT CONTROLLED WITH YOUR NAUSEA MEDICATION  *UNUSUAL  SHORTNESS OF BREATH  *UNUSUAL BRUISING OR BLEEDING  TENDERNESS IN MOUTH AND THROAT WITH OR WITHOUT PRESENCE OF ULCERS  *URINARY PROBLEMS  *BOWEL PROBLEMS  UNUSUAL RASH Items with * indicate a potential emergency and should be followed up as soon as possible.  Feel free to call the clinic you have any questions or concerns. The clinic phone number is [phone].  Please show the [place] at check-in to the Emergency Department and triage nurse.

## 2015-09-16 NOTE — Progress Notes (Signed)
[facility]    HEMATOLOGY/ONCOLOGY CLINIC NOTE  Date of Service: .09/15/2015   Patient Care Team: Veatrice Bourbon, MD as PCP - General (Family Medicine) Aquilla Hacker, MD as Resident (Family Medicine)  CHIEF COMPLAINTS/PURPOSE OF CONSULTATION:   F/u for Cholangiocarcinoma s/p completion of concurrent chemo-Radiation.  DIAGNOSIS  Stage IVA (T2a, pN1, M0) Intrahepatic Cholangiocarcinoma  Current Treatment  Plan for adjuvant gemcitabine x 4 months  Previous treatment 02/09/15 L lobe of liver partial hepatectomy segments 2, 3, 4a, 4b invasive cholangiocarcinoma, moderately differentiated. Tumor focally extends to the parenchymal margin of resection. Uninvolved liver parenchyma with steatohepatitis and associated periportal and centrilobular pericellular fibrosis. Scattered von Meyenburg complexes. One hepatic artery LN with metastatic cholangiocarcinoma. Fibroadipose tissue and nerve negative for malignancy. Gallbladder negative for malignancy. One LN negative for malignancy. T2aN1 invasive cholangiocarcinoma, moderately differentiated, with positive hepatic parenchymal margin, and 1/2 nodes positive for tumor involvement.  -Concurrent Capecitabine 821m/m2 po BID M-F while on concurrent RT Final date of RT completed 05/25/2015 Patient  had developed grade 2-3 radiation gastritis with some functional gastric outlet obstruction which has now resolved.  Interval History  Ms. MTecsonis here for follow her scheduled followup for a toxicity check. She notes minimal nausea resolved with zofran. Notes having a nagging cough with clear phlegm and wonders if he might have some bronchitis.. Still continues to smoke. Good appetite.No fevers/chills. No dysuria.  MEDICAL HISTORY:  Past Medical History  Diagnosis Date  . HTN, goal below 130/80   . HLD (hyperlipidemia)   . Benign positional vertigo   . Depression   . Anxiety   . Irritable bowel syndrome   . Obesity, Class III, BMI 40-49.9 (morbid  obesity) ([facility]   . Gout   . Restrictive lung disease   . Echocardiogram findings abnormal, without diagnosis 10/10    10/10: mild pulm HTN, EF 60-65%, mild LVH, moderate aortic regurg  . Early cataracts, bilateral 10/13    Optho, Dr SGershon Crane . COPD (chronic obstructive pulmonary disease) ([facility]   . Osteoarthritis (arthritis due to wear and tear of joints)     also gout  . Diabetes mellitus without complication ([facility]   . Obstructive sleep apnea     wears cpap  . GERD (gastroesophageal reflux disease)     I have "acid reflux"  . Cancer ([facility] 02/09/15    intrahepatic cholangiocarcinoma   SURGICAL HISTORY: Past Surgical History  Procedure Laterality Date  . Partial hysterectomy    . Rotator cuff repair        L rotator cuff repair 11/07-[facility] - 12/7/200  . Tonsillectomy    . Liver biopsy    . Abdominal hysterectomy    . Open partial hepatectomy [83] Left 02/09/15  . Esophagogastroduodenoscopy N/A 06/15/2015    Procedure: ESOPHAGOGASTRODUODENOSCOPY (EGD);  Surgeon: CGatha Mayer MD;  [facility];  Service: Endoscopy;  Laterality: N/A;    SOCIAL HISTORY: Social History   Social History  . Marital Status: Single    Spouse Name: N/A  . Number of Children: 4  . Years of Education: N/A   Occupational History  . unemployed    Social History Main Topics  . Smoking status: Current Some Day Smoker -- 0.50 packs/day for 40 years    Types: Cigarettes  . Smokeless tobacco: Never Used  . Alcohol Use: 0.0 oz/week    0 Standard drinks or equivalent per week     Comment: occasionally  . Drug Use: Yes    Special: Marijuana  .  Sexual Activity: No   Other Topics Concern  . 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 Breast cancer Mother   . Hypertension Mother   . Coronary artery disease Mother   . Diabetes type II Sister   . Pancreatic cancer Sister   . Diabetes type II Mother     ALLERGIES:  is allergic to other.  MEDICATIONS:  Current Outpatient Prescriptions  Medication Sig Dispense Refill  . cholecalciferol (VITAMIN D) 1000 UNITS tablet Take 1 tablet (1,000 Units total) by mouth daily. 30 tablet 11  . diclofenac sodium (VOLTAREN) 1 % GEL Apply 2 g topically 4 (four) times daily as needed. (Patient taking differently: Apply 2 g topically 4 (four) times daily as needed (knee pain, elbow and hands). ) 100 g 4  . fexofenadine (ALLEGRA) 30 MG tablet Take 30 mg by mouth 2 (two) times daily.    . fish oil-omega-3 fatty acids 1000 MG capsule Take 1 g by mouth every 30 (thirty) days.     . fluticasone (FLONASE) 50 MCG/ACT nasal spray Place 1 spray into the nose daily as needed for rhinitis.    . Hyprom-Naphaz-Polysorb-Zn Sulf (CLEAR EYES COMPLETE OP) Place 1 drop into both eyes daily as needed (dry eyes).     . Lidocaine-Prilocaine, Bulk, 2.5-2.5 % CREA Apply 5 mLs topically as needed (for port-a-cath use). 1 hour before use, cover with Band aid or plastic wrap to seal 30 g prn  . loratadine (CLARITIN) 10 MG tablet Take 1 tablet (10 mg total) by mouth daily as needed for allergies. 30 tablet 3  . metFORMIN (GLUCOPHAGE) 1000 MG tablet Take 0.5-1 tablets (500-1,000 mg total) by mouth 2 (two) times daily with a meal. Take 524m each morning, 10076meach night    . Multiple Vitamin (MULTIVITAMIN WITH MINERALS) TABS tablet Take 1 tablet by mouth daily.    . ondansetron (ZOFRAN) 8 MG tablet Take by mouth every 8 (eight) hours as needed for nausea or vomiting.    . pantoprazole (PROTONIX) 40 MG tablet Take 40 mg by mouth daily.    . Bonnie KitchenROAIR HFA 108 (90 Base) MCG/ACT inhaler INHALE 2 PUFFS INTO THE  LUNGS EVERY 4 HOURS AS NEEDED FOR WHEEZING OR SHORTNESS OF BREATH 1 Inhaler 0  . tiotropium (SPIRIVA) 18 MCG inhalation capsule Place 1 capsule (18 mcg total) into inhaler and inhale as needed (if its hot outside). 30 capsule 5  . traMADol (ULTRAM) 50 MG tablet Take 1 tablet (50 mg total) by mouth every 6 (six) hours as needed for moderate pain or severe pain. 60 tablet 0  . VITAMIN E PO Take 1 tablet by mouth daily.     . Bonnie Kitchenzithromycin (ZITHROMAX) 250 MG tablet 50048m2 tabs) on day 1 and then 250m65mtab) daily for 4 days 6 each 0  . dronabinol (MARINOL) 2.5 MG capsule Take 1 capsule (2.5 mg total) by mouth 2 (two) times daily before a meal. 60 capsule 0   No current facility-administered medications for this visit.    REVIEW OF SYSTEMS:  10 Point review of Systems was done is negative except as noted above.  PHYSICAL EXAMINATION: ECOG PERFORMANCE STATUS: 1 - Symptomatic but completely ambulatory Vital signs reviewed in Epic Afebrile temperature 98.6, breathing at 18, pulse 66/m, blood pressure 155/58.  GENERAL:alert, in no acute distress and comfortable SKIN: skin color, texture, turgor are normal, no rashes or significant lesions EYES: normal, conjunctiva are pink and non-injected, sclera clear OROPHARYNX: Mucous membranes moist, oral thrush resolved. NECK: supple, no JVD, thyroid normal size, non-tender, without nodularity LYMPH:  no palpable lymphadenopathy in the cervical, axillary or inguinal LUNGS: clear to auscultation with normal respiratory effort HEART: regular rate & rhythm,  no murmurs and no lower extremity edema ABDOMEN: abdomen Soft with normoactive bowel sounds, No guarding rigidity or rebound. Musculoskeletal: no cyanosis of digits and no clubbing  PSYCH: alert & oriented x 3 with fluent speech NEURO: no focal motor/sensory deficits  LABORATORY DATA:  . CBC Latest Ref Rng 09/15/2015 09/08/2015 09/07/2015  WBC 3.9 - 10.3 10e3/uL 3.3(L) 5.3 6.3  Hemoglobin 11.6 - 15.9  g/dL 12.4 12.8 12.3  Hematocrit 34.8 - 46.6 % 38.4 40.2 39.0  Platelets 145 - 400 10e3/uL 123(L) 173 187   . CMP Latest Ref Rng 09/15/2015 09/08/2015 09/07/2015  Glucose 70 - 140 mg/dl 138 139 120(H)  BUN 7.0 - 26.0 mg/dL 10.0 7.9 11  Creatinine 0.6 - 1.1 mg/dL 0.8 0.8 0.71  Sodium 136 - 145 mEq/L 140 139 138  Potassium 3.5 - 5.1 mEq/L 4.1 4.1 4.0  Chloride 101 - 111 mmol/L - - 104  CO2 22 - 29 mEq/L 26 26 27   Calcium 8.4 - 10.4 mg/dL 9.7 9.3 9.1  Total Protein 6.4 - 8.3 g/dL 7.7 7.4 -  Total Bilirubin 0.20 - 1.20 mg/dL 0.30 0.33 -  Alkaline Phos 40 - 150 U/L 88 94 -  AST 5 - 34 U/L 23 15 -  ALT 0 - 55 U/L 20 <9 -   RADIOGRAPHIC STUDIES: I have personally reviewed the radiological images as listed and agreed with the findings in the report. Ir Fluoro Guide Cv Line Right  09/07/2015  CLINICAL DATA:  Cholangiocarcinoma, needs venous access for chemotherapy EXAM: TUNNELED PORT CATHETER PLACEMENT WITH ULTRASOUND AND FLUOROSCOPIC GUIDANCE FLUOROSCOPY TIME:  64my, 1 minutes 48 seconds ANESTHESIA/SEDATION: Intravenous Fentanyl and Versed were administered as conscious sedation during continuous monitoring of the patient's level of consciousness and physiological / cardiorespiratory status by the radiology RN, with a total moderate sedation time of 18 minutes. TECHNIQUE: The procedure, risks, benefits, and alternatives were explained to the patient. Questions regarding the procedure were encouraged and answered. The patient understands and consents to the procedure. As antibiotic prophylaxis, cefazolin was ordered pre-procedure and administered intravenously within one hour of incision. Patency of the right IJ vein was confirmed with ultrasound with image documentation. An appropriate skin site was determined. Skin site was marked. Region was prepped using maximum barrier technique including cap and mask, sterile gown, sterile gloves, large sterile sheet, and Chlorhexidine as cutaneous antisepsis. The region  was infiltrated locally with 1% lidocaine.sedationUnder real-time ultrasound guidance, the right IJ vein was accessed with a 21 gauge micropuncture needle; the needle tip within the vein was confirmed with ultrasound image documentation. Needle was exchanged over a 018 guidewire for transitional dilator which allowed passage of the BPacific Cataract And Laser Institute Incwire into the IVC. Over this, the transitional dilator was exchanged for a 5 [place] catheter. A small incision was made on the right anterior chest wall and a subcutaneous pocket  fashioned. The power-injectable port was positioned and its catheter tunneled to the right IJ dermatotomy site. The MPA catheter was exchanged over an Amplatz wire for a peel-away sheath, through which the port catheter, which had been trimmed to the appropriate length, was advanced and positioned under fluoroscopy with its tip at the cavoatrial junction. Spot chest radiograph confirms good catheter position and no pneumothorax. The pocket was closed with deep interrupted and subcuticular continuous 3-0 Monocryl sutures. The port was flushed per protocol. The incisions were covered with Dermabond then covered with a sterile dressing. COMPLICATIONS: COMPLICATIONS None immediate IMPRESSION: Technically successful right IJ power-injectable port catheter placement. Ready for routine use. Electronically Signed   By: Lucrezia Europe M.D.   On: 09/07/2015 17:13   Ir US Guide Vasc Access Right  09/07/2015  CLINICAL DATA:  Cholangiocarcinoma, needs venous access for chemotherapy EXAM: TUNNELED PORT CATHETER PLACEMENT WITH ULTRASOUND AND FLUOROSCOPIC GUIDANCE FLUOROSCOPY TIME:  23my, 1 minutes 48 seconds ANESTHESIA/SEDATION: Intravenous Fentanyl and Versed were administered as conscious sedation during continuous monitoring of the patient's level of consciousness and physiological / cardiorespiratory status by the radiology RN, with a total moderate sedation time of 18 minutes. TECHNIQUE: The procedure, risks,  benefits, and alternatives were explained to the patient. Questions regarding the procedure were encouraged and answered. The patient understands and consents to the procedure. As antibiotic prophylaxis, cefazolin was ordered pre-procedure and administered intravenously within one hour of incision. Patency of the right IJ vein was confirmed with ultrasound with image documentation. An appropriate skin site was determined. Skin site was marked. Region was prepped using maximum barrier technique including cap and mask, sterile gown, sterile gloves, large sterile sheet, and Chlorhexidine as cutaneous antisepsis. The region was infiltrated locally with 1% lidocaine.sedationUnder real-time ultrasound guidance, the right IJ vein was accessed with a 21 gauge micropuncture needle; the needle tip within the vein was confirmed with ultrasound image documentation. Needle was exchanged over a 018 guidewire for transitional dilator which allowed passage of the BAurora West Allis Medical Centerwire into the IVC. Over this, the transitional dilator was exchanged for a 5 [place] catheter. A small incision was made on the right anterior chest wall and a subcutaneous pocket fashioned. The power-injectable port was positioned and its catheter tunneled to the right IJ dermatotomy site. The MPA catheter was exchanged over an Amplatz wire for a peel-away sheath, through which the port catheter, which had been trimmed to the appropriate length, was advanced and positioned under fluoroscopy with its tip at the cavoatrial junction. Spot chest radiograph confirms good catheter position and no pneumothorax. The pocket was closed with deep interrupted and subcuticular continuous 3-0 Monocryl sutures. The port was flushed per protocol. The incisions were covered with Dermabond then covered with a sterile dressing. COMPLICATIONS: COMPLICATIONS None immediate IMPRESSION: Technically successful right IJ power-injectable port catheter placement. Ready for routine use.  Electronically Signed   By: DLucrezia EuropeM.D.   On: 09/07/2015 17:13     ASSESSMENT & PLAN:   70yo with multiple medical co-morbids  1) Intrahepatic invasive cholangiocarcinoma T2a N1 M0 (Stage IVA) with focal positive parenchymal margins and 1/2 LN positive. CT C/A/P on 04/03/2016 showed no evidence of cholangiocarcinoma local recurrence or metastases at this time. Patient  has completed her adjuvant concurrent chemo-radiation  on 05/25/2015 . She did overall quite well with treatment without any overt prohibitive toxicities. 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  Required treatment with high-dose steroids and dietary modifications.  Symptoms  progressive to have resolved.  Currently on PPI for overt symptoms. CT chest abdomen pelvis in March 2017 showed no evidence of disease progression. Patient was seen at [facility] by Dr. Dennison Nancy and was given the option of observation versus adjuvant gemcitabine. 2) grade 1 nausea with gemcitabine Plan  -no prohibitive toxicities from gemcitabine. Labs stable. -ok to proceed with gemcitabine C1D8 today -continue adjuvant gemcitabine which showed 1000 mg/m on day 1, day 8 and day 15 every 28 days if tolerated. -if cytopenia's limiting prior to D15 of chemotherapy may need to switch to D1, D8 every 21 days. -will plan to complete 4 months of treatment.  2) Mild thrombocytopenia -related to gemcitabine --- continue to monitor.  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  This seems to have now resolved with steroids and PPI and the patient was able to tolerate Good oral intake  5) Acute Bronchitis /Reactive airway disease . No fevers/chills. -counseled on smoking cessation. -will empirically treat with Zpak since patient on chemotherapy.  RTC with Dr Irene Limbo in 3 weeks with cbc, cmp on C2D1  Total time spent 25 min.    Bonnie Lone MD [place] AAHIVMS [facility] Hematology/Oncology Physician [facility]  (Office):       [phone] (Work cell):  [phone] (Fax):           [phone]

## 2015-09-22 ENCOUNTER — Ambulatory Visit: Payer: Medicare HMO

## 2015-09-22 ENCOUNTER — Other Ambulatory Visit (HOSPITAL_BASED_OUTPATIENT_CLINIC_OR_DEPARTMENT_OTHER): Payer: Medicare HMO

## 2015-09-22 ENCOUNTER — Other Ambulatory Visit: Payer: Self-pay | Admitting: *Deleted

## 2015-09-22 ENCOUNTER — Telehealth: Payer: Self-pay | Admitting: Hematology

## 2015-09-22 DIAGNOSIS — C221 Intrahepatic bile duct carcinoma: Secondary | ICD-10-CM | POA: Diagnosis not present

## 2015-09-22 LAB — COMPREHENSIVE METABOLIC PANEL
ALT: 18 U/L (ref 0–55)
AST: 22 U/L (ref 5–34)
Albumin: 3.1 g/dL — ABNORMAL LOW (ref 3.5–5.0)
Alkaline Phosphatase: 68 U/L (ref 40–150)
Anion Gap: 9 mEq/L (ref 3–11)
BUN: 11 mg/dL (ref 7.0–26.0)
CO2: 25 mEq/L (ref 22–29)
Calcium: 9.6 mg/dL (ref 8.4–10.4)
Chloride: 103 mEq/L (ref 98–109)
Creatinine: 0.8 mg/dL (ref 0.6–1.1)
EGFR: 86 mL/min/{1.73_m2} — ABNORMAL LOW (ref >90)
Glucose: 158 mg/dl — ABNORMAL HIGH (ref 70–140)
Potassium: 4.4 mEq/L (ref 3.5–5.1)
Sodium: 138 mEq/L (ref 136–145)
Total Bilirubin: 0.3 mg/dL (ref 0.20–1.20)
Total Protein: 7.4 g/dL (ref 6.4–8.3)

## 2015-09-22 LAB — CBC & DIFF AND RETIC
BASO%: 0 % (ref 0.0–2.0)
Basophils Absolute: 0 10*3/uL (ref 0.0–0.1)
EOS%: 0.5 % (ref 0.0–7.0)
Eosinophils Absolute: 0 10*3/uL (ref 0.0–0.5)
HCT: 34.8 % (ref 34.8–46.6)
HGB: 11.4 g/dL — ABNORMAL LOW (ref 11.6–15.9)
Immature Retic Fract: 11.1 % — ABNORMAL HIGH (ref 1.60–10.00)
LYMPH%: 70.6 % — ABNORMAL HIGH (ref 14.0–49.7)
MCH: 28.8 pg (ref 25.1–34.0)
MCHC: 32.8 g/dL (ref 31.5–36.0)
MCV: 87.9 fL (ref 79.5–101.0)
MONO#: 0.1 10*3/uL (ref 0.1–0.9)
MONO%: 3.1 % (ref 0.0–14.0)
NEUT#: 0.5 10*3/uL — CL (ref 1.5–6.5)
NEUT%: 25.8 % — ABNORMAL LOW (ref 38.4–76.8)
Platelets: 58 10*3/uL — ABNORMAL LOW (ref 145–400)
RBC: 3.96 10*6/uL (ref 3.70–5.45)
RDW: 12.2 % (ref 11.2–14.5)
Retic %: <0.38 % — ABNORMAL LOW (ref 0.5–1.5)
Retic Ct Abs: 7.13 10*3/uL — ABNORMAL LOW (ref 33.70–90.70)
WBC: 1.9 10*3/uL — ABNORMAL LOW (ref 3.9–10.3)
lymph#: 1.4 10*3/uL (ref 0.9–3.3)

## 2015-09-22 NOTE — Telephone Encounter (Signed)
Pt will p/u new sched in tx

## 2015-09-22 NOTE — Progress Notes (Signed)
Pt ANC 0.5 and platelets 58 today. Hold treatment D15 today , per Dr. Irene Limbo. Will reschedule pt cycle treatment next appt.Pt notified and is aware.Printed out pt labs and reinforced education on neutropenic precautions. Pt verbalized understanding and knows when to call if there is a concern. Pt stated that she was recently sick and is currently taking antibiotics. Told pt to wear a mask when outside and to practice good hand hygiene at all times. Reinforced education on proper nutrition and hydration as well. Pharmacy was made aware of pt treatment hold today, and changes to treatment cycles, per Dr. Irene Limbo. Calendar printed out for pt.

## 2015-09-28 ENCOUNTER — Ambulatory Visit (HOSPITAL_BASED_OUTPATIENT_CLINIC_OR_DEPARTMENT_OTHER): Payer: Medicare HMO

## 2015-09-28 ENCOUNTER — Telehealth: Payer: Self-pay | Admitting: Hematology

## 2015-09-28 ENCOUNTER — Encounter: Payer: Self-pay | Admitting: Hematology

## 2015-09-28 ENCOUNTER — Other Ambulatory Visit (HOSPITAL_BASED_OUTPATIENT_CLINIC_OR_DEPARTMENT_OTHER): Payer: Medicare HMO

## 2015-09-28 ENCOUNTER — Ambulatory Visit (HOSPITAL_BASED_OUTPATIENT_CLINIC_OR_DEPARTMENT_OTHER): Payer: Medicare HMO | Admitting: Hematology

## 2015-09-28 VITALS — BP 112/48 | HR 75 | Temp 98.1°F | Resp 16 | Wt 163.3 lb

## 2015-09-28 DIAGNOSIS — C221 Intrahepatic bile duct carcinoma: Secondary | ICD-10-CM

## 2015-09-28 DIAGNOSIS — Z5111 Encounter for antineoplastic chemotherapy: Secondary | ICD-10-CM | POA: Diagnosis not present

## 2015-09-28 DIAGNOSIS — R110 Nausea: Secondary | ICD-10-CM | POA: Diagnosis not present

## 2015-09-28 DIAGNOSIS — R112 Nausea with vomiting, unspecified: Secondary | ICD-10-CM

## 2015-09-28 LAB — CBC & DIFF AND RETIC
BASO%: 0 % (ref 0.0–2.0)
Basophils Absolute: 0 10*3/uL (ref 0.0–0.1)
EOS%: 1 % (ref 0.0–7.0)
Eosinophils Absolute: 0.1 10*3/uL (ref 0.0–0.5)
HCT: 36.9 % (ref 34.8–46.6)
HGB: 11.9 g/dL (ref 11.6–15.9)
Immature Retic Fract: 8.6 % (ref 1.60–10.00)
LYMPH%: 36.2 % (ref 14.0–49.7)
MCH: 28.8 pg (ref 25.1–34.0)
MCHC: 32.2 g/dL (ref 31.5–36.0)
MCV: 89.3 fL (ref 79.5–101.0)
MONO#: 0.5 10*3/uL (ref 0.1–0.9)
MONO%: 10.7 % (ref 0.0–14.0)
NEUT#: 2.6 10*3/uL (ref 1.5–6.5)
NEUT%: 52.1 % (ref 38.4–76.8)
Platelets: 292 10*3/uL (ref 145–400)
RBC: 4.13 10*6/uL (ref 3.70–5.45)
RDW: 13.2 % (ref 11.2–14.5)
Retic %: 2.18 % — ABNORMAL HIGH (ref 0.70–2.10)
Retic Ct Abs: 90.03 10*3/uL (ref 33.70–90.70)
WBC: 5.1 10*3/uL (ref 3.9–10.3)
lymph#: 1.8 10*3/uL (ref 0.9–3.3)

## 2015-09-28 LAB — COMPREHENSIVE METABOLIC PANEL
ALT: 20 U/L (ref 0–55)
AST: 23 U/L (ref 5–34)
Albumin: 3.3 g/dL — ABNORMAL LOW (ref 3.5–5.0)
Alkaline Phosphatase: 85 U/L (ref 40–150)
Anion Gap: 8 mEq/L (ref 3–11)
BUN: 10.9 mg/dL (ref 7.0–26.0)
CO2: 27 mEq/L (ref 22–29)
Calcium: 9.9 mg/dL (ref 8.4–10.4)
Chloride: 106 mEq/L (ref 98–109)
Creatinine: 0.8 mg/dL (ref 0.6–1.1)
EGFR: 88 mL/min/{1.73_m2} — ABNORMAL LOW (ref >90)
Glucose: 143 mg/dl — ABNORMAL HIGH (ref 70–140)
Potassium: 4.6 mEq/L (ref 3.5–5.1)
Sodium: 141 mEq/L (ref 136–145)
Total Bilirubin: <0.3 mg/dL (ref 0.20–1.20)
Total Protein: 7.6 g/dL (ref 6.4–8.3)

## 2015-09-28 MED ORDER — HEPARIN SOD (PORK) LOCK FLUSH 100 UNIT/ML IV SOLN
500.0000 [IU] | Freq: Once | INTRAVENOUS | Status: AC | PRN
  Administered 2015-09-28: 500 [IU]
  Filled 2015-09-28: qty 5

## 2015-09-28 MED ORDER — PROCHLORPERAZINE MALEATE 10 MG PO TABS
ORAL_TABLET | ORAL | Status: AC
  Filled 2015-09-28: qty 1

## 2015-09-28 MED ORDER — SODIUM CHLORIDE 0.9 % IV SOLN
1000.0000 mg/m2 | Freq: Once | INTRAVENOUS | Status: AC
  Administered 2015-09-28: 1862 mg via INTRAVENOUS
  Filled 2015-09-28: qty 26.3

## 2015-09-28 MED ORDER — SODIUM CHLORIDE 0.9 % IV SOLN
Freq: Once | INTRAVENOUS | Status: AC
  Administered 2015-09-28: 16:00:00 via INTRAVENOUS

## 2015-09-28 MED ORDER — SODIUM CHLORIDE 0.9% FLUSH
10.0000 mL | INTRAVENOUS | Status: DC | PRN
  Administered 2015-09-28: 10 mL
  Filled 2015-09-28: qty 10

## 2015-09-28 MED ORDER — PROCHLORPERAZINE MALEATE 10 MG PO TABS
10.0000 mg | ORAL_TABLET | Freq: Once | ORAL | Status: AC
  Administered 2015-09-28: 10 mg via ORAL

## 2015-09-28 NOTE — Telephone Encounter (Signed)
per pof to sch appt-pt appts already made and confirmed by [facility] pt copy of avs

## 2015-09-28 NOTE — Patient Instructions (Signed)
[facility] Discharge Instructions for Patients Receiving Chemotherapy  Today you received the following chemotherapy agents Gemcitabine.   To help prevent nausea and vomiting after your treatment, we encourage you to take your nausea medication as directed.    If you develop nausea and vomiting that is not controlled by your nausea medication, call the clinic.   BELOW ARE SYMPTOMS THAT SHOULD BE REPORTED IMMEDIATELY:  *FEVER GREATER THAN 100.5 F  *CHILLS WITH OR WITHOUT FEVER  NAUSEA AND VOMITING THAT IS NOT CONTROLLED WITH YOUR NAUSEA MEDICATION  *UNUSUAL SHORTNESS OF BREATH  *UNUSUAL BRUISING OR BLEEDING  TENDERNESS IN MOUTH AND THROAT WITH OR WITHOUT PRESENCE OF ULCERS  *URINARY PROBLEMS  *BOWEL PROBLEMS  UNUSUAL RASH Items with * indicate a potential emergency and should be followed up as soon as possible.  Feel free to call the clinic you have any questions or concerns. The clinic phone number is [phone].  Please show the CHEMO ALERT CARD at check-in to the Emergency Department and triage nurse.   

## 2015-10-01 NOTE — Progress Notes (Signed)
Bonnie Peterson Kitchen    HEMATOLOGY/ONCOLOGY CLINIC NOTE  Date of Service: .09/28/2015    Patient Care Team: Bonnie Bourbon, MD as PCP - General (Family Medicine) Bonnie Hacker, MD as Resident (Family Medicine)  CHIEF COMPLAINTS/PURPOSE OF CONSULTATION:   F/u for Cholangiocarcinoma s/p completion of concurrent chemo-Radiation.  DIAGNOSIS  Stage IVA (T2a, pN1, M0) Intrahepatic Cholangiocarcinoma  Current Treatment  Plan for adjuvant gemcitabine x 4 months  Previous treatment 02/09/15 L lobe of liver partial hepatectomy segments 2, 3, 4a, 4b invasive cholangiocarcinoma, moderately differentiated. Tumor focally extends to the parenchymal margin of resection. Uninvolved liver parenchyma with steatohepatitis and associated periportal and centrilobular pericellular fibrosis. Scattered von Meyenburg complexes. One hepatic artery LN with metastatic cholangiocarcinoma. Fibroadipose tissue and nerve negative for malignancy. Gallbladder negative for malignancy. One LN negative for malignancy. T2aN1 invasive cholangiocarcinoma, moderately differentiated, with positive hepatic parenchymal margin, and 1/2 nodes positive for tumor involvement.  -Concurrent Capecitabine 822m/m2 po BID M-F while on concurrent RT Final date of RT completed 05/25/2015 Patient  had developed grade 2-3 radiation gastritis with some functional gastric outlet obstruction which has now resolved.  Interval History  Bonnie Peterson here for follow her scheduled followup prior to her second cycle of adjuvant gemcitabine chemotherapy. Her labs on day 15 of her first cycle showed neutropenia with ANC of 500. We have therefore changed her to a day 1, day 8 every 21 day regimen on the gemcitabine. Labs today are stable. Patient notes minimal loose stools for a couple of days. Feels that the Marinol has helped her appetite and nausea. No fevers or chills. No other acute new symptoms.   MEDICAL HISTORY:  Past Medical History  Diagnosis Date  .  HTN, goal below 130/80   . HLD (hyperlipidemia)   . Benign positional vertigo   . Depression   . Anxiety   . Irritable bowel syndrome   . Obesity, Class III, BMI 40-49.9 (morbid obesity) ([facility]   . Gout   . Restrictive lung disease   . Echocardiogram findings abnormal, without diagnosis 10/10    10/10: mild pulm HTN, EF 60-65%, mild LVH, moderate aortic regurg  . Early cataracts, bilateral 10/13    Optho, Dr SGershon Crane . COPD (ch[place] obstructive pulmonary disease) ([place]   . Osteoarthritis (arthritis due to wear and tear of joints)     also gout  . Diabetes mellitus without complication ([place]   . Obstructive sleep apnea     wears cpap  . GERD (gastroesophageal reflux disease)     I have "acid reflux"  . [place] [place]    intrahepatic cholangiocarcinoma   SURGICAL HISTORY: Past Surgical History  Procedure Laterality Date  . Partial hysterectomy    . Rotator cuff repair        L rotator cuff repair 11/07-[facility] - 12/7/200  . Tonsillectomy    . Liver biopsy    . Abdominal hysterectomy    . Open partial hepatectomy [83] Left 02/09/15  . Esophagogastroduodenoscopy N/A 06/15/2015    Procedure: ESOPHAGOGASTRODUODENOSCOPY (EGD);  Surgeon: Bonnie Mayer MD;  [facility];  Service: Endoscopy;  Laterality: N/A;    SOCIAL HISTORY: Social History   Social History  . Marital Status: Single    Spouse Name: N/A  . Number of Children: 4  . Years of Education: N/A   Occupational History  . unemployed    Social History Main Topics  . Smoking status: Current Some Day Smoker -- 0.50 packs/day for 40 years  Types: Cigarettes  . Smokeless tobacco: Never Used  . Alcohol Use: 0.0 oz/week    0 Standard drinks or equivalent per week     Comment: occasionally  . Drug Use: Yes    Special: Marijuana  . Sexual Activity: No   Other Topics Concern  . 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 Breast cancer Mother   . Hypertension Mother   . Coronary artery disease Mother   . Diabetes type II Sister   . Pancreatic cancer Sister   . Diabetes type II Mother     ALLERGIES:  is allergic to other.  MEDICATIONS:  Current Outpatient Prescriptions  Medication Sig Dispense Refill  . azithromycin (ZITHROMAX) 250 MG tablet 53m (2 tabs) on day 1 and then 2540m(1tab) daily for 4 days 6 each 0  . cholecalciferol (VITAMIN D) 1000 UNITS tablet Take 1 tablet (1,000 Units total) by mouth daily. 30 tablet 11  . diclofenac sodium (VOLTAREN) 1 % GEL Apply 2 g topically 4 (four) times daily as needed. (Patient taking differently: Apply 2 g topically 4 (four) times daily as needed (knee pain, elbow and hands). ) 100 g 4  . dronabinol (MARINOL) 2.5 MG capsule Take 1 capsule (2.5 mg total) by mouth 2 (two) times daily before a meal. 60 capsule 0  . fexofenadine (ALLEGRA) 30 MG tablet Take 30 mg by mouth 2 (two) times daily.    . fish oil-omega-3 fatty acids 1000 MG capsule Take 1 g by mouth every 30 (thirty) days.     . fluticasone (FLONASE) 50 MCG/ACT nasal spray Place 1 spray into the nose daily as needed for rhinitis.    . Hyprom-Naphaz-Polysorb-Zn Sulf (CLEAR EYES COMPLETE OP) Place 1 drop into both eyes daily as needed (dry eyes).     . Lidocaine-Prilocaine, Bulk, 2.5-2.5 % CREA Apply 5 mLs topically as needed (for port-a-cath use). 1 hour before use, cover with Band aid or plastic wrap to seal 30 g prn  . loratadine (CLARITIN) 10 MG tablet Take 1 tablet (10 mg total) by mouth daily as needed for allergies. 30 tablet 3  . metFORMIN (GLUCOPHAGE) 1000 MG tablet Take 0.5-1 tablets (500-1,000 mg total) by  mouth 2 (two) times daily with a meal. Take 50070mach morning, 1000m82mch night    . Multiple Vitamin (MULTIVITAMIN WITH MINERALS) TABS tablet Take 1 tablet by mouth daily.    . ondansetron (ZOFRAN) 8 MG tablet Take by mouth every 8 (eight) hours as needed for nausea or vomiting.    . pantoprazole (PROTONIX) 40 MG tablet Take 40 mg by mouth daily.    . PRMarland KitchenAIR HFA 108 (90 Base) MCG/ACT inhaler INHALE 2 PUFFS INTO THE LUNGS EVERY 4 HOURS AS NEEDED FOR WHEEZING OR SHORTNESS OF BREATH 1 Inhaler 0  . tiotropium (SPIRIVA) 18 MCG inhalation capsule Place 1 capsule (18 mcg total) into inhaler and inhale as needed (if its hot outside). 30 capsule 5  . traMADol (ULTRAM) 50 MG tablet Take 1 tablet (50 mg total) by mouth every 6 (six)  hours as needed for moderate pain or severe pain. 60 tablet 0  . VITAMIN E PO Take 1 tablet by mouth daily.      No current facility-administered medications for this visit.    REVIEW OF SYSTEMS:    10 Point review of Systems was done is negative except as noted above.  PHYSICAL EXAMINATION: ECOG PERFORMANCE STATUS: 1 - Symptomatic but completely ambulatory Vital signs reviewed in Epic Afebrile temperature 98.6, breathing at 18, pulse 66/m, blood pressure 155/58.  GENERAL:alert, in no acute distress and comfortable SKIN: skin color, texture, turgor are normal, no rashes or significant lesions EYES: normal, conjunctiva are pink and non-injected, sclera clear OROPHARYNX: Mucous membranes moist, oral thrush resolved. NECK: supple, no JVD, thyroid normal size, non-tender, without nodularity LYMPH:  no palpable lymphadenopathy in the cervical, axillary or inguinal LUNGS: clear to auscultation with normal respiratory effort HEART: regular rate & rhythm,  no murmurs and no lower extremity edema ABDOMEN: abdomen Soft with normoactive bowel sounds, No guarding rigidity or rebound. Musculoskeletal: no cyanosis of digits and no clubbing  PSYCH: alert & oriented x 3 with  fluent speech NEURO: no focal motor/sensory deficits  LABORATORY DATA:  . CBC Latest Ref Rng 09/28/2015 09/22/2015 09/15/2015  WBC 3.9 - 10.3 10e3/uL 5.1 1.9(L) 3.3(L)  Hemoglobin 11.6 - 15.9 g/dL 11.9 11.4(L) 12.4  Hematocrit 34.8 - 46.6 % 36.9 34.8 38.4  Platelets 145 - 400 10e3/uL 292 58(L) 123(L)   . CMP Latest Ref Rng 09/28/2015 09/22/2015 09/15/2015  Glucose 70 - 140 mg/dl 143(H) 158(H) 138  BUN 7.0 - 26.0 mg/dL 10.9 11.0 10.0  Creatinine 0.6 - 1.1 mg/dL 0.8 0.8 0.8  Sodium 136 - 145 mEq/L 141 138 140  Potassium 3.5 - 5.1 mEq/L 4.6 4.4 4.1  CO2 22 - 29 mEq/L 27 25 26   Calcium 8.4 - 10.4 mg/dL 9.9 9.6 9.7  Total Protein 6.4 - 8.3 g/dL 7.6 7.4 7.7  Total Bilirubin 0.20 - 1.20 mg/dL <0.30 0.30 0.30  Alkaline Phos 40 - 150 U/L 85 68 88  AST 5 - 34 U/L 23 22 23   ALT 0 - 55 U/L 20 18 20    RADIOGRAPHIC STUDIES: I have personally reviewed the radiological images as listed and agreed with the findings in the report. Ir Fluoro Guide Cv Line Right  09/07/2015  CLINICAL DATA:  Cholangiocarcinoma, needs venous access for chemotherapy EXAM: TUNNELED PORT CATHETER PLACEMENT WITH ULTRASOUND AND FLUOROSCOPIC GUIDANCE FLUOROSCOPY TIME:  35my, 1 minutes 48 seconds ANESTHESIA/SEDATION: Intravenous Fentanyl and Versed were administered as conscious sedation during continuous monitoring of the patient's level of consciousness and physiological / cardiorespiratory status by the radiology RN, with a total moderate sedation time of 18 minutes. TECHNIQUE: The procedure, risks, benefits, and alternatives were explained to the patient. Questions regarding the procedure were encouraged and answered. The patient understands and consents to the procedure. As antibiotic prophylaxis, cefazolin was ordered pre-procedure and administered intravenously within one hour of incision. Patency of the right IJ vein was confirmed with ultrasound with image documentation. An appropriate skin site was determined. Skin site was marked.  Region was prepped using maximum barrier technique including cap and mask, sterile gown, sterile gloves, large sterile sheet, and Chlorhexidine as cutaneous antisepsis. The region was infiltrated locally with 1% lidocaine.sedationUnder real-time ultrasound guidance, the right IJ vein was accessed with a 21 gauge micropuncture needle; the needle tip within the vein was confirmed with ultrasound image documentation. Needle was exchanged over a 018 guidewire for transitional dilator which allowed passage  of the [facility] wire into the IVC. Over this, the transitional dilator was exchanged for a 5 [place] MPA catheter. A small incision was made on the right anterior chest wall and a subcutaneous pocket fashioned. The power-injectable port was positioned and its catheter tunneled to the right IJ dermatotomy site. The MPA catheter was exchanged over an Amplatz wire for a peel-away sheath, through which the port catheter, which had been trimmed to the appropriate length, was advanced and positioned under fluoroscopy with its tip at the cavoatrial junction. Spot chest radiograph confirms good catheter position and no pneumothorax. The pocket was closed with deep interrupted and subcuticular continuous 3-0 Monocryl sutures. The port was flushed per protocol. The incisions were covered with Dermabond then covered with a sterile dressing. COMPLICATIONS: COMPLICATIONS None immediate IMPRESSION: Technically successful right IJ power-injectable port catheter placement. Ready for routine use. Electronically Signed   By: Lucrezia Europe M.D.   On: 09/07/2015 17:13   Ir US Guide Vasc Access Right  09/07/2015  CLINICAL DATA:  Cholangiocarcinoma, needs venous access for chemotherapy EXAM: TUNNELED PORT CATHETER PLACEMENT WITH ULTRASOUND AND FLUOROSCOPIC GUIDANCE FLUOROSCOPY TIME:  43my, 1 minutes 48 seconds ANESTHESIA/SEDATION: Intravenous Fentanyl and Versed were administered as conscious sedation during continuous monitoring of the  patient's level of consciousness and physiological / cardiorespiratory status by the radiology RN, with a total moderate sedation time of 18 minutes. TECHNIQUE: The procedure, risks, benefits, and alternatives were explained to the patient. Questions regarding the procedure were encouraged and answered. The patient understands and consents to the procedure. As antibiotic prophylaxis, cefazolin was ordered pre-procedure and administered intravenously within one hour of incision. Patency of the right IJ vein was confirmed with ultrasound with image documentation. An appropriate skin site was determined. Skin site was marked. Region was prepped using maximum barrier technique including cap and mask, sterile gown, sterile gloves, large sterile sheet, and Chlorhexidine as cutaneous antisepsis. The region was infiltrated locally with 1% lidocaine.sedationUnder real-time ultrasound guidance, the right IJ vein was accessed with a 21 gauge micropuncture needle; the needle tip within the vein was confirmed with ultrasound image documentation. Needle was exchanged over a 018 guidewire for transitional dilator which allowed [facility] the [facility] the IVC. Over this, the transitional dilator was exchanged for a 5 [place] catheter. A small incision was made on the right anterior chest wall and a subcutaneous pocket fashioned. The power-injectable port was positioned and its catheter tunneled to the right IJ dermatotomy site. The MPA catheter was exchanged over an Amplatz wire for a peel-away sheath, through which the port catheter, which had been trimmed to the appropriate length, was advanced and positioned under fluoroscopy with its tip at the cavoatrial junction. Spot chest radiograph confirms good catheter position and no pneumothorax. The pocket was closed with deep interrupted and subcuticular continuous 3-0 Monocryl sutures. The port was flushed per protocol. The incisions were covered with Dermabond then  covered with a sterile dressing. COMPLICATIONS: COMPLICATIONS None immediate IMPRESSION: Technically successful right IJ power-injectable port catheter placement. Ready for routine use. Electronically Signed   By: DLucrezia EuropeM.D.   On: 09/07/2015 17:13     ASSESSMENT & PLAN:   70yo with multiple medical co-morbids  1) Intrahepatic invasive cholangiocarcinoma T2a N1 M0 (Stage IVA) with focal positive parenchymal margins and 1/2 LN positive. CT C/A/P on 04/03/2016 showed no evidence of cholangiocarcinoma local recurrence or metastases at this time. Patient  has completed her adjuvant concurrent chemo-radiation  on 05/25/2015 .  She did overall quite well with treatment without any overt prohibitive toxicities. 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  Required treatment with high-dose steroids and dietary modifications.  Symptoms progressive to have resolved.  Currently on PPI for overt symptoms. CT chest abdomen pelvis in March 2017 showed no evidence of disease progression. Patient was [facility] by Dr. Dennison Nancy and was given the option of observation versus adjuvant gemcitabine. 2) grade 1 nausea with gemcitabine Plan  -no prohibitive toxicities from gemcitabine. Labs stable today -ok to proceed with gemcitabine C2. Has switched to day 1, day 8 every 21 day regimen. -will plan to complete 4 months of treatment.  2) Mild thrombocytopenia -resolved on labs today.  3)h/o Grade 2 radiation gastritis with functional gastrointestinal obstruction that previously caused significant nausea or vomiting.  Resolved.  4) Acute Bronchitis /Reactive airway disease . No fevers/chills.--This is now resolved -counseled on smoking cessation.  5)Chemotherapy related nausea and poor appetite. Patient is responding well to Marinol. We'll increase her Marinol dose to 5 mg by mouth twice a day.  RTC with Dr Irene Limbo in 3 weeks with cbc, cmp on C3D1 of gemcitabine.  Earlier if any new concerns  Total time spent 25 min.   Sullivan Lone MD [place] AAHIVMS [facility] Hematology/Oncology Physician [facility]  (Office):       [phone] (Work cell):  [phone] (Fax):           [phone]

## 2015-10-06 ENCOUNTER — Ambulatory Visit: Payer: Medicare HMO | Admitting: Hematology

## 2015-10-06 ENCOUNTER — Telehealth: Payer: Self-pay | Admitting: *Deleted

## 2015-10-06 ENCOUNTER — Ambulatory Visit: Payer: Medicare HMO

## 2015-10-06 ENCOUNTER — Other Ambulatory Visit: Payer: Medicare HMO

## 2015-10-06 NOTE — Telephone Encounter (Signed)
Received triage fax stating patient unable to make scheduled appointments today.  Per fax, pt stated she would call to reschedule later.

## 2015-10-12 ENCOUNTER — Encounter: Payer: Self-pay | Admitting: *Deleted

## 2015-10-13 ENCOUNTER — Other Ambulatory Visit (HOSPITAL_BASED_OUTPATIENT_CLINIC_OR_DEPARTMENT_OTHER): Payer: Medicare HMO

## 2015-10-13 ENCOUNTER — Ambulatory Visit (HOSPITAL_BASED_OUTPATIENT_CLINIC_OR_DEPARTMENT_OTHER): Payer: Medicare HMO

## 2015-10-13 VITALS — BP 140/51 | HR 52 | Temp 98.2°F | Resp 18

## 2015-10-13 DIAGNOSIS — Z5111 Encounter for antineoplastic chemotherapy: Secondary | ICD-10-CM | POA: Diagnosis not present

## 2015-10-13 DIAGNOSIS — C221 Intrahepatic bile duct carcinoma: Secondary | ICD-10-CM

## 2015-10-13 LAB — CBC & DIFF AND RETIC
BASO%: 0.1 % (ref 0.0–2.0)
Basophils Absolute: 0 10*3/uL (ref 0.0–0.1)
EOS%: 0.4 % (ref 0.0–7.0)
Eosinophils Absolute: 0 10*3/uL (ref 0.0–0.5)
HCT: 36.1 % (ref 34.8–46.6)
HGB: 11.7 g/dL (ref 11.6–15.9)
Immature Retic Fract: 6.9 % (ref 1.60–10.00)
LYMPH%: 22.7 % (ref 14.0–49.7)
MCH: 28.9 pg (ref 25.1–34.0)
MCHC: 32.4 g/dL (ref 31.5–36.0)
MCV: 89.1 fL (ref 79.5–101.0)
MONO#: 0.9 10*3/uL (ref 0.1–0.9)
MONO%: 12.6 % (ref 0.0–14.0)
NEUT#: 4.5 10*3/uL (ref 1.5–6.5)
NEUT%: 64.2 % (ref 38.4–76.8)
Platelets: 187 10*3/uL (ref 145–400)
RBC: 4.05 10*6/uL (ref 3.70–5.45)
RDW: 14.7 % — ABNORMAL HIGH (ref 11.2–14.5)
Retic %: 2.28 % — ABNORMAL HIGH (ref 0.70–2.10)
Retic Ct Abs: 92.34 10*3/uL — ABNORMAL HIGH (ref 33.70–90.70)
WBC: 7 10*3/uL (ref 3.9–10.3)
lymph#: 1.6 10*3/uL (ref 0.9–3.3)

## 2015-10-13 LAB — COMPREHENSIVE METABOLIC PANEL
ALT: 13 U/L (ref 0–55)
AST: 16 U/L (ref 5–34)
Albumin: 3.1 g/dL — ABNORMAL LOW (ref 3.5–5.0)
Alkaline Phosphatase: 84 U/L (ref 40–150)
Anion Gap: 9 mEq/L (ref 3–11)
BUN: 11.5 mg/dL (ref 7.0–26.0)
CO2: 26 mEq/L (ref 22–29)
Calcium: 9.6 mg/dL (ref 8.4–10.4)
Chloride: 106 mEq/L (ref 98–109)
Creatinine: 0.7 mg/dL (ref 0.6–1.1)
EGFR: >90 mL/min/{1.73_m2} (ref >90)
Glucose: 131 mg/dl (ref 70–140)
Potassium: 4.5 mEq/L (ref 3.5–5.1)
Sodium: 141 mEq/L (ref 136–145)
Total Bilirubin: <0.3 mg/dL (ref 0.20–1.20)
Total Protein: 7.2 g/dL (ref 6.4–8.3)

## 2015-10-13 MED ORDER — PROCHLORPERAZINE MALEATE 10 MG PO TABS
ORAL_TABLET | ORAL | Status: AC
  Filled 2015-10-13: qty 1

## 2015-10-13 MED ORDER — SODIUM CHLORIDE 0.9 % IV SOLN
1000.0000 mg/m2 | Freq: Once | INTRAVENOUS | Status: AC
  Administered 2015-10-13: 1862 mg via INTRAVENOUS
  Filled 2015-10-13: qty 48.97

## 2015-10-13 MED ORDER — PROCHLORPERAZINE MALEATE 10 MG PO TABS
10.0000 mg | ORAL_TABLET | Freq: Once | ORAL | Status: AC
  Administered 2015-10-13: 10 mg via ORAL

## 2015-10-13 MED ORDER — HEPARIN SOD (PORK) LOCK FLUSH 100 UNIT/ML IV SOLN
500.0000 [IU] | Freq: Once | INTRAVENOUS | Status: AC | PRN
  Administered 2015-10-13: 500 [IU]
  Filled 2015-10-13: qty 5

## 2015-10-13 MED ORDER — SODIUM CHLORIDE 0.9% FLUSH
10.0000 mL | INTRAVENOUS | Status: DC | PRN
  Administered 2015-10-13: 10 mL
  Filled 2015-10-13: qty 10

## 2015-10-13 MED ORDER — SODIUM CHLORIDE 0.9 % IV SOLN
Freq: Once | INTRAVENOUS | Status: AC
  Administered 2015-10-13: 12:00:00 via INTRAVENOUS

## 2015-10-13 NOTE — Patient Instructions (Signed)
[facility] Discharge Instructions for Patients Receiving Chemotherapy  Today you received the following chemotherapy agents Gemzar  To help prevent nausea and vomiting after your treatment, we encourage you to take your nausea medication as directed.    If you develop nausea and vomiting that is not controlled by your nausea medication, call the clinic.   BELOW ARE SYMPTOMS THAT SHOULD BE REPORTED IMMEDIATELY:  *FEVER GREATER THAN 100.5 F  *CHILLS WITH OR WITHOUT FEVER  NAUSEA AND VOMITING THAT IS NOT CONTROLLED WITH YOUR NAUSEA MEDICATION  *UNUSUAL SHORTNESS OF BREATH  *UNUSUAL BRUISING OR BLEEDING  TENDERNESS IN MOUTH AND THROAT WITH OR WITHOUT PRESENCE OF ULCERS  *URINARY PROBLEMS  *BOWEL PROBLEMS  UNUSUAL RASH Items with * indicate a potential emergency and should be followed up as soon as possible.  Feel free to call the clinic you have any questions or concerns. The clinic phone number is [phone].  Please show the CHEMO ALERT CARD at check-in to the Emergency Department and triage nurse.   

## 2015-10-20 ENCOUNTER — Other Ambulatory Visit: Payer: Medicare HMO

## 2015-10-20 ENCOUNTER — Telehealth: Payer: Self-pay | Admitting: Hematology

## 2015-10-20 ENCOUNTER — Ambulatory Visit (HOSPITAL_BASED_OUTPATIENT_CLINIC_OR_DEPARTMENT_OTHER): Payer: Medicare HMO | Admitting: Hematology

## 2015-10-20 ENCOUNTER — Ambulatory Visit (HOSPITAL_BASED_OUTPATIENT_CLINIC_OR_DEPARTMENT_OTHER): Payer: Medicare HMO

## 2015-10-20 ENCOUNTER — Other Ambulatory Visit: Payer: Self-pay | Admitting: *Deleted

## 2015-10-20 ENCOUNTER — Encounter: Payer: Self-pay | Admitting: Hematology

## 2015-10-20 ENCOUNTER — Other Ambulatory Visit (HOSPITAL_BASED_OUTPATIENT_CLINIC_OR_DEPARTMENT_OTHER): Payer: Medicare HMO

## 2015-10-20 VITALS — BP 131/48 | HR 62 | Temp 98.1°F | Resp 18 | Ht 63.0 in | Wt 158.1 lb

## 2015-10-20 DIAGNOSIS — Z5111 Encounter for antineoplastic chemotherapy: Secondary | ICD-10-CM

## 2015-10-20 DIAGNOSIS — C221 Intrahepatic bile duct carcinoma: Secondary | ICD-10-CM | POA: Diagnosis not present

## 2015-10-20 DIAGNOSIS — R112 Nausea with vomiting, unspecified: Secondary | ICD-10-CM | POA: Diagnosis not present

## 2015-10-20 LAB — CBC & DIFF AND RETIC
BASO%: 0.3 % (ref 0.0–2.0)
Basophils Absolute: 0 10*3/uL (ref 0.0–0.1)
EOS%: 0.3 % (ref 0.0–7.0)
Eosinophils Absolute: 0 10*3/uL (ref 0.0–0.5)
HCT: 31.2 % — ABNORMAL LOW (ref 34.8–46.6)
HGB: 10.2 g/dL — ABNORMAL LOW (ref 11.6–15.9)
Immature Retic Fract: 8.9 % (ref 1.60–10.00)
LYMPH%: 39.2 % (ref 14.0–49.7)
MCH: 28.8 pg (ref 25.1–34.0)
MCHC: 32.7 g/dL (ref 31.5–36.0)
MCV: 88.1 fL (ref 79.5–101.0)
MONO#: 0.3 10*3/uL (ref 0.1–0.9)
MONO%: 8.5 % (ref 0.0–14.0)
NEUT#: 1.7 10*3/uL (ref 1.5–6.5)
NEUT%: 51.7 % (ref 38.4–76.8)
Platelets: 167 10*3/uL (ref 145–400)
RBC: 3.54 10*6/uL — ABNORMAL LOW (ref 3.70–5.45)
RDW: 14.4 % (ref 11.2–14.5)
Retic %: <0.38 % — ABNORMAL LOW (ref 0.5–1.5)
Retic Ct Abs: 12.74 10*3/uL — ABNORMAL LOW (ref 33.70–90.70)
WBC: 3.2 10*3/uL — ABNORMAL LOW (ref 3.9–10.3)
lymph#: 1.3 10*3/uL (ref 0.9–3.3)

## 2015-10-20 LAB — COMPREHENSIVE METABOLIC PANEL
ALT: 18 U/L (ref 0–55)
AST: 18 U/L (ref 5–34)
Albumin: 3.1 g/dL — ABNORMAL LOW (ref 3.5–5.0)
Alkaline Phosphatase: 82 U/L (ref 40–150)
Anion Gap: 10 mEq/L (ref 3–11)
BUN: 12.7 mg/dL (ref 7.0–26.0)
CO2: 25 mEq/L (ref 22–29)
Calcium: 9.5 mg/dL (ref 8.4–10.4)
Chloride: 106 mEq/L (ref 98–109)
Creatinine: 0.7 mg/dL (ref 0.6–1.1)
EGFR: >90 mL/min/{1.73_m2} (ref >90)
Glucose: 137 mg/dl (ref 70–140)
Potassium: 4.2 mEq/L (ref 3.5–5.1)
Sodium: 141 mEq/L (ref 136–145)
Total Bilirubin: <0.3 mg/dL (ref 0.20–1.20)
Total Protein: 7.2 g/dL (ref 6.4–8.3)

## 2015-10-20 MED ORDER — PROCHLORPERAZINE MALEATE 10 MG PO TABS
ORAL_TABLET | ORAL | Status: AC
  Filled 2015-10-20: qty 1

## 2015-10-20 MED ORDER — SODIUM CHLORIDE 0.9 % IV SOLN
Freq: Once | INTRAVENOUS | Status: AC
  Administered 2015-10-20: 12:00:00 via INTRAVENOUS

## 2015-10-20 MED ORDER — HEPARIN SOD (PORK) LOCK FLUSH 100 UNIT/ML IV SOLN
500.0000 [IU] | Freq: Once | INTRAVENOUS | Status: AC | PRN
  Administered 2015-10-20: 500 [IU]
  Filled 2015-10-20: qty 5

## 2015-10-20 MED ORDER — PROCHLORPERAZINE MALEATE 10 MG PO TABS
10.0000 mg | ORAL_TABLET | Freq: Once | ORAL | Status: AC
  Administered 2015-10-20: 10 mg via ORAL

## 2015-10-20 MED ORDER — TRAMADOL HCL 50 MG PO TABS: 50.0000 mg | ORAL_TABLET | Freq: Four times a day (QID) | ORAL | Status: DC | PRN

## 2015-10-20 MED ORDER — SODIUM CHLORIDE 0.9% FLUSH
10.0000 mL | INTRAVENOUS | Status: DC | PRN
  Administered 2015-10-20: 10 mL
  Filled 2015-10-20: qty 10

## 2015-10-20 MED ORDER — DRONABINOL 5 MG PO CAPS: 5.0000 mg | ORAL_CAPSULE | Freq: Three times a day (TID) | ORAL | Status: DC

## 2015-10-20 MED ORDER — SODIUM CHLORIDE 0.9 % IV SOLN
1000.0000 mg/m2 | Freq: Once | INTRAVENOUS | Status: AC
  Administered 2015-10-20: 1862 mg via INTRAVENOUS
  Filled 2015-10-20: qty 48.97

## 2015-10-20 MED FILL — DRONABINOL 5 MG CAPSULE: 5 | 30 days supply | Qty: 90 | Fill #0

## 2015-10-20 MED FILL — traMADol HCL 50 MG TABS: 50 | 15 days supply | Qty: 60 | Fill #0

## 2015-10-20 NOTE — Telephone Encounter (Signed)
per pof to sch pt appt-gave pt copy of avs °

## 2015-10-20 NOTE — Patient Instructions (Signed)
[facility] Center Discharge Instructions for Patients Receiving Chemotherapy  Today you received the following chemotherapy agents Gemzar  To help prevent nausea and vomiting after your treatment, we encourage you to take your nausea medication as directed.    If you develop nausea and vomiting that is not controlled by your nausea medication, call the clinic.   BELOW ARE SYMPTOMS THAT SHOULD BE REPORTED IMMEDIATELY:  *FEVER GREATER THAN 100.5 F  *CHILLS WITH OR WITHOUT FEVER  NAUSEA AND VOMITING THAT IS NOT CONTROLLED WITH YOUR NAUSEA MEDICATION  *UNUSUAL SHORTNESS OF BREATH  *UNUSUAL BRUISING OR BLEEDING  TENDERNESS IN MOUTH AND THROAT WITH OR WITHOUT PRESENCE OF ULCERS  *URINARY PROBLEMS  *BOWEL PROBLEMS  UNUSUAL RASH Items with * indicate a potential emergency and should be followed up as soon as possible.  Feel free to call the clinic you have any questions or concerns. The clinic phone number is [phone].  Please show the CHEMO ALERT CARD at check-in to the Emergency Department and triage nurse.   

## 2015-10-30 NOTE — Progress Notes (Signed)
[name]    HEMATOLOGY/ONCOLOGY CLINIC NOTE  Date of Service: .10/20/2015    Patient Care Team: Veatrice Bourbon, MD as PCP - General (Family Medicine) Aquilla Hacker, MD (Inactive) as Resident (Family Medicine)  CHIEF COMPLAINTS/PURPOSE OF CONSULTATION:   F/u for Cholangiocarcinoma s/p completion of concurrent chemo-Radiation.  DIAGNOSIS  Stage IVA (T2a, pN1, M0) Intrahepatic Cholangiocarcinoma  Current Treatment  Plan for adjuvant gemcitabine x 4 months  Previous treatment 02/09/15 L lobe of liver partial hepatectomy segments 2, 3, 4a, 4b invasive cholangiocarcinoma, moderately differentiated. Tumor focally extends to the parenchymal margin of resection. Uninvolved liver parenchyma with steatohepatitis and associated periportal and centrilobular pericellular fibrosis. Scattered von Meyenburg complexes. One hepatic artery LN with metastatic cholangiocarcinoma. Fibroadipose tissue and nerve negative for malignancy. Gallbladder negative for malignancy. One LN negative for malignancy. T2aN1 invasive cholangiocarcinoma, moderately differentiated, with positive hepatic parenchymal margin, and 1/2 nodes positive for tumor involvement.  -Concurrent Capecitabine 875m/m2 po BID M-F while on concurrent RT Final date of RT completed 05/25/2015 Patient  had developed grade 2-3 radiation gastritis with some functional gastric outlet obstruction which has now resolved.  Interval History  Bonnie Peterson here for follow her scheduled followup prior to her third cycle of adjuvant gemcitabine chemotherapy. She notes some increased fatigue grade 1-2 due to chemotherapy.  Notes some persistent nausea which is somewhat improved with the Marinol but wonders if she can go up on the dose of Marinol to help her appetite and nausea further.  She was given a new prescription with the higher dose of Marinol. Patient had some left leg pain due to gout which is resolved. [facility] which showed no evidence of recurrent or metastatic disease. Patient notes no new abdominal pain.  No other acute new focal symptoms.  MEDICAL HISTORY:  Past Medical History:  Diagnosis Date  . Anxiety   . Benign positional vertigo   . [place]   intrahepatic cholangiocarcinoma  . COPD (chronic obstructive pulmonary disease) ([place]   . Depression   . Diabetes mellitus without complication ([place]   . Early cataracts, bilateral 10/13   Optho, Dr SGershon Crane . Echocardiogram findings abnormal, without diagnosis 10/10   10/10: mild pulm HTN, EF 60-65%, mild LVH, moderate aortic regurg  . GERD (gastroesophageal reflux disease)    I have "acid reflux"  . Gout   . HLD (hyperlipidemia)   . HTN, goal below 130/80   . Irritable bowel syndrome   . Obesity, Class III, BMI 40-49.9 (morbid obesity) ([place]   . Obstructive sleep apnea    wears cpap  . Osteoarthritis (arthritis due to wear and tear of joints)    also gout  . Restrictive lung disease    SURGICAL HISTORY: Past Surgical History:  Procedure Laterality Date  . ABDOMINAL HYSTERECTOMY    . ESOPHAGOGASTRODUODENOSCOPY N/A 06/15/2015   Procedure: ESOPHAGOGASTRODUODENOSCOPY (EGD);  Surgeon: CGatha Mayer MD;  [facility];  Service: Endoscopy;  Laterality: N/A;  . LIVER BIOPSY    . OPEN PARTIAL HEPATECTOMY  Left 02/09/15  . PARTIAL HYSTERECTOMY    . ROTATOR CUFF REPAIR       L rotator cuff repair 11/07-[facility] - 12/7/200  . TONSILLECTOMY      SOCIAL HISTORY: Social History   Social History  . Marital status: Single    Spouse name: N/A  . Number of children: 4  . Years of education: N/A   Occupational  History  . unemployed    Social History Main Topics  . Smoking status: Current Some Day Smoker    Packs/day: 0.50    Years: 40.00    Types: Cigarettes  . Smokeless tobacco: Never Used  . Alcohol use 0.0 oz/week     Comment: occasionally  . Drug use:     Types: Marijuana  .  Sexual activity: No   Other Topics Concern  . 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 Breast cancer Mother   . Hypertension Mother   . Coronary artery disease Mother   . Diabetes type II Sister   . Pancreatic cancer Sister   . Diabetes type II Mother     ALLERGIES:  is allergic to other.  MEDICATIONS:  Current Outpatient Prescriptions  Medication Sig Dispense Refill  . azithromycin (ZITHROMAX) 250 MG tablet 563m (2 tabs) on day 1 and then 2559m(1tab) daily for 4 days 6 each 0  . cholecalciferol (VITAMIN D) 1000 UNITS tablet Take 1 tablet (1,000 Units total) by mouth daily. 30 tablet 11  . diclofenac sodium (VOLTAREN) 1 % GEL Apply 2 g topically 4 (four) times daily as needed. (Patient taking differently: Apply 2 g topically 4 (four) times daily as needed (knee pain, elbow and hands). ) 100 g 4  . dronabinol (MARINOL) 5 MG capsule Take 1 capsule (5 mg total) by mouth 3 (three) times daily before meals. 90 capsule 0  . fexofenadine (ALLEGRA) 30 MG tablet Take 30 mg by mouth 2 (two) times daily.    . fish oil-omega-3 fatty acids 1000 MG capsule Take 1 g by mouth every 30 (thirty) days.     . fluticasone (FLONASE) 50 MCG/ACT nasal spray Place 1 spray into the nose daily as needed for rhinitis.    . Hyprom-Naphaz-Polysorb-Zn Sulf (CLEAR EYES COMPLETE OP) Place 1 drop into both eyes daily as needed (dry eyes).     . Lidocaine-Prilocaine, Bulk, 2.5-2.5 % CREA Apply 5 mLs topically as needed (for port-a-cath use). 1 hour before use, cover with Band aid or plastic wrap to seal 30 g prn  . loratadine (CLARITIN) 10 MG tablet  Take 1 tablet (10 mg total) by mouth daily as needed for allergies. 30 tablet 3  . Multiple Vitamin (MULTIVITAMIN WITH MINERALS) TABS tablet Take 1 tablet by mouth daily.    . ondansetron (ZOFRAN) 8 MG tablet Take by mouth every 8 (eight) hours as needed for nausea or vomiting.    . pantoprazole (PROTONIX) 40 MG tablet Take 40 mg by mouth daily.    . Marland KitchenROAIR HFA 108 (90 Base) MCG/ACT inhaler INHALE 2 PUFFS INTO THE LUNGS EVERY 4 HOURS AS NEEDED FOR WHEEZING OR SHORTNESS OF BREATH 1 Inhaler 0  . tiotropium (SPIRIVA) 18 MCG inhalation capsule Place 1 capsule (18 mcg total) into inhaler and inhale as needed (if its hot outside). 30 capsule 5  . traMADol (ULTRAM) 50 MG tablet Take 1 tablet (50 mg total) by mouth every 6 (six) hours as needed for moderate pain or severe pain. 60[place]  tablet 0  . VITAMIN E PO Take 1 tablet by mouth daily.      No current facility-administered medications for this visit.     REVIEW OF SYSTEMS:    10 Point review of Systems was done is negative except as noted above.  PHYSICAL EXAMINATION: ECOG PERFORMANCE STATUS: 1 - Symptomatic but completely ambulatory Vital signs reviewed in Epic Afebrile temperature 98.6, breathing at 18, pulse 66/m, blood pressure 155/58.  GENERAL:alert, in no acute distress and comfortable SKIN: skin color, texture, turgor are normal, no rashes or significant lesions EYES: normal, conjunctiva are pink and non-injected, sclera clear OROPHARYNX: Mucous membranes moist, oral thrush resolved. NECK: supple, no JVD, thyroid normal size, non-tender, without nodularity LYMPH:  no palpable lymphadenopathy in the cervical, axillary or inguinal LUNGS: clear to auscultation with normal respiratory effort HEART: regular rate & rhythm,  no murmurs and no lower extremity edema ABDOMEN: abdomen Soft with normoactive bowel sounds, No guarding rigidity or rebound. Musculoskeletal: no cyanosis of digits and no clubbing  PSYCH: alert & oriented x 3 with fluent  speech NEURO: no focal motor/sensory deficits  LABORATORY DATA:  . CBC Latest Ref Rng & Units 10/20/2015 10/13/2015 09/28/2015  WBC 3.9 - 10.3 10e3/uL 3.2(L) 7.0 5.1  Hemoglobin 11.6 - 15.9 g/dL 10.2(L) 11.7 11.9  Hematocrit 34.8 - 46.6 % 31.2(L) 36.1 36.9  Platelets 145 - 400 10e3/uL 167 187 292   . CMP Latest Ref Rng & Units 10/20/2015 10/13/2015 09/28/2015  Glucose 70 - 140 mg/dl 137 131 143(H)  BUN 7.0 - 26.0 mg/dL 12.7 11.5 10.9  Creatinine 0.6 - 1.1 mg/dL 0.7 0.7 0.8  Sodium 136 - 145 mEq/L 141 141 141  Potassium 3.5 - 5.1 mEq/L 4.2 4.5 4.6  Chloride 101 - 111 mmol/L - - -  CO2 22 - 29 mEq/L 25 26 27   Calcium 8.4 - 10.4 mg/dL 9.5 9.6 9.9  Total Protein 6.4 - 8.3 g/dL 7.2 7.2 7.6  Total Bilirubin 0.20 - 1.20 mg/dL <0.30 <0.30 <0.30  Alkaline Phos 40 - 150 U/L 82 84 85  AST 5 - 34 U/L 18 16 23   ALT 0 - 55 U/L 18 13 20    RADIOGRAPHIC STUDIES: I have personally reviewed the radiological images as listed and agreed with the findings in the report.  CT chest abdomen pelvis 10/16/2015:  Impression: 1. Status post left hepatectomy with no evidence of recurrent or metastatic disease. 2. Right lower lobe pulmonary nodule (4 mm), unchanged from 11/17/2014. Attention on follow-up is recommended.  Electronically Reviewed JI:RCVELFY Enslow, MD Electronically Reviewed on:10/16/2015 3:31 PM    ASSESSMENT & PLAN:   70 yo with multiple medical co-morbids  1) Intrahepatic invasive cholangiocarcinoma T2a N1 M0 (Stage IVA) with focal positive parenchymal margins and 1/2 LN positive. CT C/A/P on 04/03/2016 showed no evidence of cholangiocarcinoma local recurrence or metastases at this time. Patient  has completed her adjuvant concurrent chemo-radiation  on 05/25/2015 . She did overall quite well with treatment without any overt prohibitive toxicities. 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  Required treatment with high-dose steroids and  dietary modifications.  Symptoms progressive to have resolved.  Currently on PPI for overt symptoms. CT chest abdomen pelvis in March 2017 showed no evidence of disease progression. Patient was [facility] by Dr. Dennison Nancy and was given the option of observation versus adjuvant gemcitabine. 2) grade 1 nausea with gemcitabine 3)grade 1-2 fatigue.  Plan  -no prohibitive toxicities from gemcitabine. Labs acceptable. -ok  to proceed with gemcitabine C3.  -will plan to complete 4 months of treatment if tolerated.  2) Mild thrombocytopenia -resolved   3)h/o Grade 2 radiation gastritis with functional gastrointestinal obstruction that previously caused significant nausea or vomiting.  Resolved.  4) Acute Bronchitis /Reactive airway disease . No fevers/chills.--This is now resolved -counseled on smoking cessation.  5)Chemotherapy related nausea and poor appetite. Patient is responding well to Marinol. We'll increase her Marinol dose to 5 mg by mouththree a day.  RTC with Dr Irene Limbo in 3 weeks with cbc, cmp on C3D1 of gemcitabine. Earlier if any new concerns   Sullivan Lone MD [place] AAHIVMS [facility] Hematology/Oncology Physician [facility]  (Office):       [phone] (Work cell):  [phone] (Fax):           [phone]

## 2015-11-01 ENCOUNTER — Encounter: Payer: Self-pay | Admitting: Skilled Nursing Facility1

## 2015-11-01 NOTE — Progress Notes (Signed)
To assist the pt in identifying dietary strategies to gain lost wt.  Pt identified as malnourished due to lost wt.  Pt contacted via the telephone at [phone]. Pt states she has no appetite and does not like the foods she once enjoyed. Pt states she has to avoid all white foods because they cause a gout flair up. Pt states she drinks 2 chocolate glucerna's a day. Pt states she cannot tolerate ensure because they cause her blood sugar to run up too high.Pt states she eats ice cream all the time and still enjoys that. Pt states she does not want to gain more wt bu she does want her appetite back.  Dietitian offered the option of making her glucernas into smoothies. Dietitian will send the pt nutrition information.

## 2015-11-10 ENCOUNTER — Ambulatory Visit: Payer: Medicare HMO

## 2015-11-10 ENCOUNTER — Other Ambulatory Visit (HOSPITAL_BASED_OUTPATIENT_CLINIC_OR_DEPARTMENT_OTHER): Payer: Medicare HMO

## 2015-11-10 ENCOUNTER — Ambulatory Visit (HOSPITAL_BASED_OUTPATIENT_CLINIC_OR_DEPARTMENT_OTHER): Payer: Medicare HMO | Admitting: Hematology

## 2015-11-10 ENCOUNTER — Ambulatory Visit (HOSPITAL_BASED_OUTPATIENT_CLINIC_OR_DEPARTMENT_OTHER): Payer: Medicare HMO

## 2015-11-10 ENCOUNTER — Encounter: Payer: Self-pay | Admitting: Hematology

## 2015-11-10 DIAGNOSIS — C221 Intrahepatic bile duct carcinoma: Secondary | ICD-10-CM

## 2015-11-10 DIAGNOSIS — R110 Nausea: Secondary | ICD-10-CM | POA: Diagnosis not present

## 2015-11-10 DIAGNOSIS — Z5111 Encounter for antineoplastic chemotherapy: Secondary | ICD-10-CM

## 2015-11-10 DIAGNOSIS — R5383 Other fatigue: Secondary | ICD-10-CM

## 2015-11-10 DIAGNOSIS — Z95828 Presence of other vascular implants and grafts: Secondary | ICD-10-CM | POA: Insufficient documentation

## 2015-11-10 LAB — CBC & DIFF AND RETIC
BASO%: 0.4 % (ref 0.0–2.0)
Basophils Absolute: 0 10*3/uL (ref 0.0–0.1)
EOS%: 1.2 % (ref 0.0–7.0)
Eosinophils Absolute: 0.1 10*3/uL (ref 0.0–0.5)
HCT: 31.8 % — ABNORMAL LOW (ref 34.8–46.6)
HGB: 10.2 g/dL — ABNORMAL LOW (ref 11.6–15.9)
Immature Retic Fract: 4.2 % (ref 1.60–10.00)
LYMPH%: 23.4 % (ref 14.0–49.7)
MCH: 29.1 pg (ref 25.1–34.0)
MCHC: 32.1 g/dL (ref 31.5–36.0)
MCV: 90.6 fL (ref 79.5–101.0)
MONO#: 0.8 10*3/uL (ref 0.1–0.9)
MONO%: 14.1 % — ABNORMAL HIGH (ref 0.0–14.0)
NEUT#: 3.5 10*3/uL (ref 1.5–6.5)
NEUT%: 60.9 % (ref 38.4–76.8)
Platelets: 301 10*3/uL (ref 145–400)
RBC: 3.51 10*6/uL — ABNORMAL LOW (ref 3.70–5.45)
RDW: 17.7 % — ABNORMAL HIGH (ref 11.2–14.5)
Retic %: 3.63 % — ABNORMAL HIGH (ref 0.70–2.10)
Retic Ct Abs: 127.41 10*3/uL — ABNORMAL HIGH (ref 33.70–90.70)
WBC: 5.7 10*3/uL (ref 3.9–10.3)
lymph#: 1.3 10*3/uL (ref 0.9–3.3)

## 2015-11-10 LAB — COMPREHENSIVE METABOLIC PANEL
ALT: 11 U/L (ref 0–55)
AST: 16 U/L (ref 5–34)
Albumin: 3.1 g/dL — ABNORMAL LOW (ref 3.5–5.0)
Alkaline Phosphatase: 85 U/L (ref 40–150)
Anion Gap: 8 mEq/L (ref 3–11)
BUN: 8.2 mg/dL (ref 7.0–26.0)
CO2: 23 mEq/L (ref 22–29)
Calcium: 9.2 mg/dL (ref 8.4–10.4)
Chloride: 108 mEq/L (ref 98–109)
Creatinine: 0.7 mg/dL (ref 0.6–1.1)
EGFR: >90 mL/min/{1.73_m2} (ref >90)
Glucose: 126 mg/dl (ref 70–140)
Potassium: 3.7 mEq/L (ref 3.5–5.1)
Sodium: 139 mEq/L (ref 136–145)
Total Bilirubin: <0.3 mg/dL (ref 0.20–1.20)
Total Protein: 6.6 g/dL (ref 6.4–8.3)

## 2015-11-10 MED ORDER — PROCHLORPERAZINE MALEATE 10 MG PO TABS: 10.0000 mg | ORAL_TABLET | Freq: Once | ORAL | Status: DC

## 2015-11-10 MED ORDER — SODIUM CHLORIDE 0.9 % IV SOLN
1000.0000 mg/m2 | Freq: Once | INTRAVENOUS | Status: AC
  Administered 2015-11-10: 1862 mg via INTRAVENOUS
  Filled 2015-11-10: qty 48.97

## 2015-11-10 MED ORDER — SODIUM CHLORIDE 0.9 % IJ SOLN
10.0000 mL | INTRAMUSCULAR | Status: DC | PRN
  Administered 2015-11-10: 10 mL via INTRAVENOUS
  Filled 2015-11-10: qty 10

## 2015-11-10 MED ORDER — HEPARIN SOD (PORK) LOCK FLUSH 100 UNIT/ML IV SOLN
500.0000 [IU] | Freq: Once | INTRAVENOUS | Status: AC | PRN
  Administered 2015-11-10: 500 [IU]
  Filled 2015-11-10: qty 5

## 2015-11-10 MED ORDER — SODIUM CHLORIDE 0.9 % IV SOLN
Freq: Once | INTRAVENOUS | Status: AC
  Administered 2015-11-10: 13:00:00 via INTRAVENOUS

## 2015-11-10 MED ORDER — SODIUM CHLORIDE 0.9% FLUSH
10.0000 mL | INTRAVENOUS | Status: DC | PRN
  Administered 2015-11-10: 10 mL
  Filled 2015-11-10: qty 10

## 2015-11-10 NOTE — Patient Instructions (Signed)

## 2015-11-10 NOTE — Patient Instructions (Signed)
[facility] Discharge Instructions for Patients Receiving Chemotherapy  Today you received the following chemotherapy agents Gemzar  To help prevent nausea and vomiting after your treatment, we encourage you to take your nausea medication as directed.    If you develop nausea and vomiting that is not controlled by your nausea medication, call the clinic.   BELOW ARE SYMPTOMS THAT SHOULD BE REPORTED IMMEDIATELY:  *FEVER GREATER THAN 100.5 F  *CHILLS WITH OR WITHOUT FEVER  NAUSEA AND VOMITING THAT IS NOT CONTROLLED WITH YOUR NAUSEA MEDICATION  *UNUSUAL SHORTNESS OF BREATH  *UNUSUAL BRUISING OR BLEEDING  TENDERNESS IN MOUTH AND THROAT WITH OR WITHOUT PRESENCE OF ULCERS  *URINARY PROBLEMS  *BOWEL PROBLEMS  UNUSUAL RASH Items with * indicate a potential emergency and should be followed up as soon as possible.  Feel free to call the clinic you have any questions or concerns. The clinic phone number is [phone].  Please show the CHEMO ALERT CARD at check-in to the Emergency Department and triage nurse.   

## 2015-11-12 NOTE — Progress Notes (Signed)
Bonnie Peterson    HEMATOLOGY/ONCOLOGY CLINIC NOTE  Date of Service: 11/10/2015    Patient Care Team: Veatrice Bourbon, MD as PCP - General (Family Medicine) Aquilla Hacker, MD (Inactive) as Resident (Family Medicine)  CHIEF COMPLAINTS/PURPOSE OF CONSULTATION:   F/u for Cholangiocarcinoma s/p completion of concurrent chemo-Radiation.  DIAGNOSIS  Stage IVA (T2a, pN1, M0) Intrahepatic Cholangiocarcinoma  Current Treatment  Plan for adjuvant gemcitabine x 4 months  Previous treatment 02/09/15 L lobe of liver partial hepatectomy segments 2, 3, 4a, 4b invasive cholangiocarcinoma, moderately differentiated. Tumor focally extends to the parenchymal margin of resection. Uninvolved liver parenchyma with steatohepatitis and associated periportal and centrilobular pericellular fibrosis. Scattered von Meyenburg complexes. One hepatic artery LN with metastatic cholangiocarcinoma. Fibroadipose tissue and nerve negative for malignancy. Gallbladder negative for malignancy. One LN negative for malignancy. T2aN1 invasive cholangiocarcinoma, moderately differentiated, with positive hepatic parenchymal margin, and 1/2 nodes positive for tumor involvement.  -Concurrent Capecitabine 824m/m2 po BID M-F while on concurrent RT Final date of RT completed 05/25/2015 Patient  had developed grade 2-3 radiation gastritis with some functional gastric outlet obstruction which has now resolved.  Interval History  [name] here for follow her scheduled followup prior to her fourth cycle of adjuvant gemcitabine chemotherapy. She notes some increased fatigue grade 1 due to chemotherapy.  Notes that she is eating better with the Marinol and that it has helped her nausea . No acute new symptoms. No fevers or chills.  Patient notes no new abdominal pain.  No other acute new focal symptoms.  MEDICAL HISTORY:  Past Medical History:  Diagnosis Date  . Anxiety   . Benign positional vertigo   . [place]   intrahepatic cholangiocarcinoma  . COPD (chronic obstructive pulmonary disease) ([place]   . Depression   . Diabetes mellitus without complication ([place]   . Early cataracts, bilateral 10/13   Optho, Dr SGershon Crane . Echocardiogram findings abnormal, without diagnosis 10/10   10/10: mild pulm HTN, EF 60-65%, mild LVH, moderate aortic regurg  . GERD (gastroesophageal reflux disease)    I have "acid reflux"  . Gout   . HLD (hyperlipidemia)   . HTN, goal below 130/80   . Irritable bowel syndrome   . Obesity, Class III, BMI 40-49.9 (morbid obesity) ([place]   . Obstructive sleep apnea    wears cpap  . Osteoarthritis (arthritis due to wear and tear of joints)    also gout  . Restrictive lung disease    SURGICAL HISTORY: Past Surgical History:  Procedure Laterality Date  . ABDOMINAL HYSTERECTOMY    . ESOPHAGOGASTRODUODENOSCOPY N/A 06/15/2015   Procedure: ESOPHAGOGASTRODUODENOSCOPY (EGD);  Surgeon: CGatha Mayer MD;  [facility];  Service: Endoscopy;  Laterality: N/A;  . LIVER BIOPSY    . OPEN PARTIAL HEPATECTOMY  Left 02/09/15  . PARTIAL HYSTERECTOMY    . ROTATOR CUFF REPAIR       L rotator cuff repair 11/07-[facility] - 12/7/200  . TONSILLECTOMY      SOCIAL HISTORY: Social History   Social History  . Marital status: Single    Spouse name: N/A  . Number of children: 4  . Years of education: N/A   Occupational History  . unemployed    Social History Main Topics  . Smoking status: Current Some Day Smoker    Packs/day: 0.50    Years: 40.00    Types: Cigarettes  . Smokeless tobacco: Never Used  . Alcohol use 0.0 oz/week     Comment: occasionally  .  Drug use:     Types: Marijuana  . Sexual activity: No   Other Topics Concern  . 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 Breast cancer Mother   . Hypertension Mother   . Coronary artery disease Mother   . Diabetes type II Mother   . Diabetes type II Sister   . Pancreatic cancer Sister     ALLERGIES:  is allergic to other.  MEDICATIONS:  Current Outpatient Prescriptions  Medication Sig Dispense Refill  . azithromycin (ZITHROMAX) 250 MG tablet 542m (2 tabs) on day 1 and then 2516m(1tab) daily for 4 days 6 each 0  . cholecalciferol (VITAMIN D) 1000 UNITS tablet Take 1 tablet (1,000 Units total) by mouth daily. 30 tablet 11  . diclofenac sodium (VOLTAREN) 1 % GEL Apply 2 g topically 4 (four) times daily as needed. (Patient taking differently: Apply 2 g topically 4 (four) times daily as needed (knee pain, elbow and hands). ) 100 g 4  . dronabinol (MARINOL) 5 MG capsule Take 1 capsule (5 mg total) by mouth 3 (three) times daily before meals. 90 capsule 0  . fexofenadine (ALLEGRA) 30 MG tablet Take 30 mg by mouth 2 (two) times daily.    . fish oil-omega-3 fatty acids 1000 MG capsule Take 1 g by mouth every 30 (thirty) days.     . fluticasone (FLONASE) 50 MCG/ACT nasal spray Place 1 spray into the nose daily as needed for rhinitis.    . Hyprom-Naphaz-Polysorb-Zn Sulf (CLEAR EYES COMPLETE OP) Place 1 drop into both eyes daily as needed (dry eyes).     . Lidocaine-Prilocaine, Bulk, 2.5-2.5 % CREA Apply 5 mLs topically as needed (for port-a-cath use). 1 hour before use, cover with Band aid or plastic wrap to seal 30 g prn  . loratadine (CLARITIN) 10 MG tablet Take 1 tablet (10 mg total) by mouth daily as needed for allergies. 30 tablet 3  . Multiple Vitamin (MULTIVITAMIN WITH MINERALS) TABS tablet Take 1 tablet by mouth daily.    . ondansetron (ZOFRAN) 8 MG tablet Take by mouth every 8 (eight) hours as needed for nausea or vomiting.    .  pantoprazole (PROTONIX) 40 MG tablet Take 40 mg by mouth daily.    . Bonnie KitchenROAIR HFA 108 (90 Base) MCG/ACT inhaler INHALE 2 PUFFS INTO THE LUNGS EVERY 4 HOURS AS NEEDED FOR WHEEZING OR SHORTNESS OF BREATH 1 Inhaler 0  . tiotropium (SPIRIVA) 18 MCG inhalation capsule Place 1 capsule (18 mcg total) into inhaler and inhale as needed (if its hot outside). 30 capsule 5  . traMADol (ULTRAM) 50 MG tablet Take 1 tablet (50 mg total) by mouth every 6 (six) hours as needed for moderate pain or severe pain. 60 tablet 0  . VITAMIN E PO Take 1 tablet by mouth daily.      No current facility-administered medications for this visit.     REVIEW OF SYSTEMS:    10 Point review of Systems was done is negative except as noted above.  PHYSICAL EXAMINATION: ECOG PERFORMANCE STATUS:  1 - Symptomatic but completely ambulatory Vital signs reviewed in Epic Afebrile temperature 98.6, breathing at 18, pulse 66/m, blood pressure 155/58.  GENERAL:alert, in no acute distress and comfortable SKIN: skin color, texture, turgor are normal, no rashes or significant lesions EYES: normal, conjunctiva are pink and non-injected, sclera clear OROPHARYNX: Mucous membranes moist, oral thrush resolved. NECK: supple, no JVD, thyroid normal size, non-tender, without nodularity LYMPH:  no palpable lymphadenopathy in the cervical, axillary or inguinal LUNGS: clear to auscultation with normal respiratory effort HEART: regular rate & rhythm,  no murmurs and no lower extremity edema ABDOMEN: abdomen Soft with normoactive bowel sounds, No guarding rigidity or rebound. Musculoskeletal: no cyanosis of digits and no clubbing  PSYCH: alert & oriented x 3 with fluent speech NEURO: no focal motor/sensory deficits  LABORATORY DATA:  . CBC Latest Ref Rng & Units 11/10/2015 10/20/2015 10/13/2015  WBC 3.9 - 10.3 10e3/uL 5.7 3.2(L) 7.0  Hemoglobin 11.6 - 15.9 g/dL 10.2(L) 10.2(L) 11.7  Hematocrit 34.8 - 46.6 % 31.8(L) 31.2(L) 36.1  Platelets 145 - 400  10e3/uL 301 167 187   . CMP Latest Ref Rng & Units 11/10/2015 10/20/2015 10/13/2015  Glucose 70 - 140 mg/dl 126 137 131  BUN 7.0 - 26.0 mg/dL 8.2 12.7 11.5  Creatinine 0.6 - 1.1 mg/dL 0.7 0.7 0.7  Sodium 136 - 145 mEq/L 139 141 141  Potassium 3.5 - 5.1 mEq/L 3.7 4.2 4.5  Chloride 101 - 111 mmol/L - - -  CO2 22 - 29 mEq/L 23 25 26   Calcium 8.4 - 10.4 mg/dL 9.2 9.5 9.6  Total Protein 6.4 - 8.3 g/dL 6.6 7.2 7.2  Total Bilirubin 0.20 - 1.20 mg/dL <0.30 <0.30 <0.30  Alkaline Phos 40 - 150 U/L 85 82 84  AST 5 - 34 U/L 16 18 16   ALT 0 - 55 U/L 11 18 13    RADIOGRAPHIC STUDIES: I have personally reviewed the radiological images as listed and agreed with the findings in the report.  CT chest abdomen pelvis 10/16/2015:  Impression: 1. Status post left hepatectomy with no evidence of recurrent or metastatic disease. 2. Right lower lobe pulmonary nodule (4 mm), unchanged from 11/17/2014. Attention on follow-up is recommended.  Electronically Reviewed QJ:JHERDEY Enslow, MD Electronically Reviewed on:10/16/2015 3:31 PM    ASSESSMENT & PLAN:   70 yo with multiple medical co-morbids  1) Intrahepatic invasive cholangiocarcinoma T2a N1 M0 (Stage IVA) with focal positive parenchymal margins and 1/2 LN positive. CT C/A/P on 04/03/2016 showed no evidence of cholangiocarcinoma local recurrence or metastases at this time. Patient  has completed her adjuvant concurrent chemo-radiation  on 05/25/2015 . She did overall quite well with treatment without any overt prohibitive toxicities. 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  Required treatment with high-dose steroids and dietary modifications.  Symptoms progressive to have resolved.  Currently on PPI for overt symptoms. CT chest abdomen pelvis in March 2017 showed no evidence of disease progression. Patient was seen at [facility] by Dr. Dennison Nancy and was given the option of observation versus adjuvant  gemcitabine. She had a followup at [facility] with a CT Chest abdomen pelvis on 10/16/2015 which showed no evidence of recurrent or metastatic disease.  2) grade 1 nausea with gemcitabine 3)grade 1-2 fatigue.  Plan  -no prohibitive toxicities from gemcitabine. Labs acceptable. -No clinical evidence of disease progression at this time -ok to proceed with gemcitabine C4.  -will plan to complete 4 months of treatment if tolerated.  2) Mild thrombocytopenia -resolved  3)h/o Grade 2 radiation gastritis with functional gastrointestinal obstruction that previously caused significant nausea or vomiting.  Resolved.  4) Acute Bronchitis /Reactive airway disease . No fevers/chills.--This is now resolved -counseled on smoking cessation.  5)Chemotherapy related nausea and poor appetite. Patient is responding well to Marinol. Continue on Marinol dose to 5 mg by mouth three a day. Monitor for confusion  RTC with Dr Irene Limbo in 3 weeks with cbc, cmp on C4D1 of gemcitabine. Earlier if any new concerns   Sullivan Lone MD No Name AAHIVMS [facility] Hematology/Oncology Physician [facility]  (Office):       [phone] (Work cell):  [phone] (Fax):           [phone]

## 2015-11-14 ENCOUNTER — Other Ambulatory Visit: Payer: Self-pay | Admitting: *Deleted

## 2015-11-14 ENCOUNTER — Other Ambulatory Visit: Payer: Self-pay | Admitting: Hematology

## 2015-11-14 ENCOUNTER — Telehealth: Payer: Self-pay | Admitting: Hematology

## 2015-11-14 NOTE — Telephone Encounter (Signed)
Spoke with pt to confirm 8/11 appt date/time per pof

## 2015-11-17 ENCOUNTER — Other Ambulatory Visit (HOSPITAL_BASED_OUTPATIENT_CLINIC_OR_DEPARTMENT_OTHER): Payer: Medicare HMO

## 2015-11-17 ENCOUNTER — Ambulatory Visit (HOSPITAL_BASED_OUTPATIENT_CLINIC_OR_DEPARTMENT_OTHER): Payer: Medicare HMO

## 2015-11-17 ENCOUNTER — Ambulatory Visit: Payer: Medicare HMO

## 2015-11-17 VITALS — BP 117/58 | HR 53 | Temp 97.9°F | Resp 18

## 2015-11-17 DIAGNOSIS — Z5111 Encounter for antineoplastic chemotherapy: Secondary | ICD-10-CM

## 2015-11-17 DIAGNOSIS — C221 Intrahepatic bile duct carcinoma: Secondary | ICD-10-CM

## 2015-11-17 DIAGNOSIS — Z95828 Presence of other vascular implants and grafts: Secondary | ICD-10-CM

## 2015-11-17 LAB — CBC & DIFF AND RETIC
BASO%: 0.8 % (ref 0.0–2.0)
Basophils Absolute: 0 10*3/uL (ref 0.0–0.1)
EOS%: 0.4 % (ref 0.0–7.0)
Eosinophils Absolute: 0 10*3/uL (ref 0.0–0.5)
HCT: 28.3 % — ABNORMAL LOW (ref 34.8–46.6)
HGB: 9.2 g/dL — ABNORMAL LOW (ref 11.6–15.9)
Immature Retic Fract: 30.1 % — ABNORMAL HIGH (ref 1.60–10.00)
LYMPH%: 34.6 % (ref 14.0–49.7)
MCH: 28.9 pg (ref 25.1–34.0)
MCHC: 32.5 g/dL (ref 31.5–36.0)
MCV: 89 fL (ref 79.5–101.0)
MONO#: 0.4 10*3/uL (ref 0.1–0.9)
MONO%: 13.6 % (ref 0.0–14.0)
NEUT#: 1.3 10*3/uL — ABNORMAL LOW (ref 1.5–6.5)
NEUT%: 50.6 % (ref 38.4–76.8)
Platelets: 142 10*3/uL — ABNORMAL LOW (ref 145–400)
RBC: 3.18 10*6/uL — ABNORMAL LOW (ref 3.70–5.45)
RDW: 16.6 % — ABNORMAL HIGH (ref 11.2–14.5)
Retic %: 0.76 % (ref 0.70–2.10)
Retic Ct Abs: 24.17 10*3/uL — ABNORMAL LOW (ref 33.70–90.70)
WBC: 2.6 10*3/uL — ABNORMAL LOW (ref 3.9–10.3)
lymph#: 0.9 10*3/uL (ref 0.9–3.3)
nRBC: 0 % (ref 0–0)

## 2015-11-17 LAB — COMPREHENSIVE METABOLIC PANEL
ALT: 14 U/L (ref 0–55)
AST: 19 U/L (ref 5–34)
Albumin: 2.9 g/dL — ABNORMAL LOW (ref 3.5–5.0)
Alkaline Phosphatase: 83 U/L (ref 40–150)
Anion Gap: 9 mEq/L (ref 3–11)
BUN: 8.3 mg/dL (ref 7.0–26.0)
CO2: 26 mEq/L (ref 22–29)
Calcium: 9.3 mg/dL (ref 8.4–10.4)
Chloride: 105 mEq/L (ref 98–109)
Creatinine: 0.6 mg/dL (ref 0.6–1.1)
EGFR: >90 mL/min/{1.73_m2} (ref >90)
Glucose: 128 mg/dl (ref 70–140)
Potassium: 3.7 mEq/L (ref 3.5–5.1)
Sodium: 140 mEq/L (ref 136–145)
Total Bilirubin: <0.3 mg/dL (ref 0.20–1.20)
Total Protein: 6.6 g/dL (ref 6.4–8.3)

## 2015-11-17 MED ORDER — SODIUM CHLORIDE 0.9 % IJ SOLN
10.0000 mL | INTRAMUSCULAR | Status: DC | PRN
  Administered 2015-11-17: 10 mL via INTRAVENOUS
  Filled 2015-11-17: qty 10

## 2015-11-17 MED ORDER — SODIUM CHLORIDE 0.9% FLUSH
10.0000 mL | INTRAVENOUS | Status: DC | PRN
  Administered 2015-11-17: 10 mL
  Filled 2015-11-17: qty 10

## 2015-11-17 MED ORDER — HEPARIN SOD (PORK) LOCK FLUSH 100 UNIT/ML IV SOLN
500.0000 [IU] | Freq: Once | INTRAVENOUS | Status: AC | PRN
  Administered 2015-11-17: 500 [IU]
  Filled 2015-11-17: qty 5

## 2015-11-17 MED ORDER — SODIUM CHLORIDE 0.9 % IV SOLN
1000.0000 mg/m2 | Freq: Once | INTRAVENOUS | Status: AC
  Administered 2015-11-17: 1862 mg via INTRAVENOUS
  Filled 2015-11-17: qty 48.97

## 2015-11-17 MED ORDER — SODIUM CHLORIDE 0.9 % IV SOLN
Freq: Once | INTRAVENOUS | Status: AC
  Administered 2015-11-17: 12:00:00 via INTRAVENOUS

## 2015-11-17 NOTE — Progress Notes (Signed)
PT declined anti-emetic.  Reports she took zofran at 830 am.

## 2015-11-17 NOTE — Patient Instructions (Signed)
[facility] Discharge Instructions for Patients Receiving Chemotherapy  Today you received the following chemotherapy agents Gemzar  To help prevent nausea and vomiting after your treatment, we encourage you to take your nausea medication as directed.    If you develop nausea and vomiting that is not controlled by your nausea medication, call the clinic.   BELOW ARE SYMPTOMS THAT SHOULD BE REPORTED IMMEDIATELY:  *FEVER GREATER THAN 100.5 F  *CHILLS WITH OR WITHOUT FEVER  NAUSEA AND VOMITING THAT IS NOT CONTROLLED WITH YOUR NAUSEA MEDICATION  *UNUSUAL SHORTNESS OF BREATH  *UNUSUAL BRUISING OR BLEEDING  TENDERNESS IN MOUTH AND THROAT WITH OR WITHOUT PRESENCE OF ULCERS  *URINARY PROBLEMS  *BOWEL PROBLEMS  UNUSUAL RASH Items with * indicate a potential emergency and should be followed up as soon as possible.  Feel free to call the clinic you have any questions or concerns. The clinic phone number is [phone].  Please show the CHEMO ALERT CARD at check-in to the Emergency Department and triage nurse.   

## 2015-11-17 NOTE — Progress Notes (Signed)
Per Dr. Burr Medico, okay to tx with Elmo 1.3.

## 2015-11-17 NOTE — Patient Instructions (Signed)

## 2015-11-22 ENCOUNTER — Encounter: Payer: Self-pay | Admitting: Student

## 2015-11-22 ENCOUNTER — Ambulatory Visit (INDEPENDENT_AMBULATORY_CARE_PROVIDER_SITE_OTHER): Payer: Medicare HMO | Admitting: Student

## 2015-11-22 VITALS — BP 130/71 | HR 62 | Temp 97.8°F | Wt 150.0 lb

## 2015-11-22 DIAGNOSIS — M5442 Lumbago with sciatica, left side: Secondary | ICD-10-CM

## 2015-11-22 DIAGNOSIS — E119 Type 2 diabetes mellitus without complications: Secondary | ICD-10-CM

## 2015-11-22 DIAGNOSIS — M10011 Idiopathic gout, right shoulder: Secondary | ICD-10-CM

## 2015-11-22 DIAGNOSIS — M109 Gout, unspecified: Secondary | ICD-10-CM

## 2015-11-22 LAB — POCT GLYCOSYLATED HEMOGLOBIN (HGB A1C): Hemoglobin A1C: 5.9

## 2015-11-22 MED ORDER — ALLOPURINOL 100 MG PO TABS: 100.0000 mg | ORAL_TABLET | Freq: Every day | ORAL | 1 refills | Status: DC

## 2015-11-22 MED FILL — ALLOPURINOL 100 MG TABLET: 100 | 30 days supply | Qty: 30 | Fill #0

## 2015-11-22 NOTE — Progress Notes (Signed)
   Subjective:    Patient ID: Bonnie Peterson, female    DOB: 07-26-1945, 70 y.o.   MRN: [id number]   CC:  HPI:   Smoking status reviewed  Review of Systems  Per HPI, else denies recent illness, fever, headache, changes in vision, chest pain, shortness of breath, abdominal pain, N/V/D, weakness    Objective:  BP 130/71   Pulse 62   Temp 97.8 F (36.6 C) (Oral)   Wt 150 lb (68 kg)   SpO2 100%   BMI 26.57 kg/m  Vitals and nursing note reviewed  General: NAD Cardiac: RRR, normal heart sounds, no murmurs. 2+ radial and PT pulses bilaterally Respiratory: CTAB, normal effort Abdomen: soft, nontender, nondistended, no hepatic or splenomegaly. Bowel sounds present Extremities: no edema or cyanosis. WWP. Skin: warm and dry, no rashes noted Neuro: alert and oriented, no focal deficits   Assessment & Plan:    No problem-specific Assessment & Plan notes found for this encounter.    Adiya Selmer A. Lincoln Brigham MD, [facility] Resident PGY-3 Pager [phone]

## 2015-11-22 NOTE — Patient Instructions (Signed)
Follow with PCP as needed Please discuss with  oncologist worsening lower extremity pain If you have questions or concerns call the office at [phone]

## 2015-11-22 NOTE — Progress Notes (Signed)
   Subjective:    Patient ID: Bonnie Peterson, female    DOB: 23-Nov-1945, 70 y.o.   MRN: [id number]   CC: back pain  HPI: 70 y/o F with h/o hepqtic cholangiocarcinoma resents for back pain with radiation to right leg pain   Back pain -Has been present for several years but feels that has been worsening over the last several months - Pain starts in her low back and radiates down her right leg - Denies loss of bowel or bladder function - Denies weakness - Feels is worsened after starting chemotherapy for her hepatic cancer Smoking status reviewed - Patient concern for gout could be contributing as she has not been taking allopurinol or colchicine for several months as she ran out  Review of Systems  Per HPI, else denies recent illness, fever, headache, changes in vision, chest pain, shortness of breath, abdominal pain, N/V/D, weakness    Objective:  BP 130/71   Pulse 62   Temp 97.8 F (36.6 C) (Oral)   Wt 150 lb (68 kg)   SpO2 100%   BMI 26.57 kg/m  Vitals and nursing note reviewed  General: NAD Cardiac: RRR,  Respiratory: CTAB, normal effort MSK: Negative tenderness to palpation over back, positive straight leg raise on the right, negative straight leg raise test on the left  Extremities: no edema or cyanosis Skin: warm and dry, no rashes noted Neuro: alert and oriented,   Assessment & Plan:    Back pain Back pain which appears to be stable but now with worsening radiculopathy on the right. No evidence of weakness, loss of bowel or bladder function. She does have a significant history of osteoarthritis results is likely playing a role however worsening sciatic pain concerning. There is some concern that potentially her new regimen of chemotherapy could be contributing. - Will obtain lumbar x-rays - Follow with oncology to ask about risk for uropathy with chemotherapy - Continue conservative management for pain, over-the-counter pain relievers  -Will follow  closely  GOUT NOS Obtain serum uric acid today as it has not been checked in some time and she's not been taking allopurinol or colchicine - Restart allopurinol if elevated    Samiha Denapoli A. Lincoln Brigham MD, [facility] Resident PGY-3 Pager [phone]

## 2015-11-23 DIAGNOSIS — M549 Dorsalgia, unspecified: Secondary | ICD-10-CM | POA: Insufficient documentation

## 2015-11-23 LAB — URIC ACID: Uric Acid, Serum: 5.2 mg/dL (ref 2.5–7.0)

## 2015-11-23 NOTE — Assessment & Plan Note (Signed)
Back pain which appears to be stable but now with worsening radiculopathy on the right. No evidence of weakness, loss of bowel or bladder function. She does have a significant history of osteoarthritis results is likely playing a role however worsening sciatic pain concerning. There is some concern that potentially her new regimen of chemotherapy could be contributing. - Will obtain lumbar x-rays - Follow with oncology to ask about risk for uropathy with chemotherapy - Continue conservative management for pain, over-the-counter pain relievers  -Will follow closely

## 2015-11-23 NOTE — Assessment & Plan Note (Addendum)
Obtain serum uric acid today as it has not been checked in some time and she's not been taking allopurinol or colchicine - Restart allopurinol if elevated

## 2015-12-01 ENCOUNTER — Ambulatory Visit (HOSPITAL_BASED_OUTPATIENT_CLINIC_OR_DEPARTMENT_OTHER): Payer: Medicare HMO

## 2015-12-01 ENCOUNTER — Ambulatory Visit: Payer: Medicare HMO

## 2015-12-01 ENCOUNTER — Other Ambulatory Visit (HOSPITAL_BASED_OUTPATIENT_CLINIC_OR_DEPARTMENT_OTHER): Payer: Medicare HMO

## 2015-12-01 VITALS — BP 158/57 | HR 62 | Temp 98.4°F | Resp 18

## 2015-12-01 DIAGNOSIS — C221 Intrahepatic bile duct carcinoma: Secondary | ICD-10-CM | POA: Diagnosis not present

## 2015-12-01 DIAGNOSIS — Z5111 Encounter for antineoplastic chemotherapy: Secondary | ICD-10-CM

## 2015-12-01 LAB — CBC & DIFF AND RETIC
BASO%: 0.2 % (ref 0.0–2.0)
Basophils Absolute: 0 10e3/uL (ref 0.0–0.1)
EOS%: 1.3 % (ref 0.0–7.0)
Eosinophils Absolute: 0.1 10e3/uL (ref 0.0–0.5)
HCT: 30 % — ABNORMAL LOW (ref 34.8–46.6)
HGB: 9.7 g/dL — ABNORMAL LOW (ref 11.6–15.9)
Immature Retic Fract: 6.6 % (ref 1.60–10.00)
LYMPH%: 19.5 % (ref 14.0–49.7)
MCH: 30 pg (ref 25.1–34.0)
MCHC: 32.3 g/dL (ref 31.5–36.0)
MCV: 92.9 fL (ref 79.5–101.0)
MONO#: 0.7 10e3/uL (ref 0.1–0.9)
MONO%: 13 % (ref 0.0–14.0)
NEUT#: 3.5 10e3/uL (ref 1.5–6.5)
NEUT%: 66 % (ref 38.4–76.8)
Platelets: 281 10e3/uL (ref 145–400)
RBC: 3.23 10e6/uL — ABNORMAL LOW (ref 3.70–5.45)
RDW: 18.3 % — ABNORMAL HIGH (ref 11.2–14.5)
Retic %: 3.8 % — ABNORMAL HIGH (ref 0.70–2.10)
Retic Ct Abs: 122.74 10e3/uL — ABNORMAL HIGH (ref 33.70–90.70)
WBC: 5.3 10e3/uL (ref 3.9–10.3)
lymph#: 1 10e3/uL (ref 0.9–3.3)

## 2015-12-01 LAB — COMPREHENSIVE METABOLIC PANEL WITH GFR
ALT: 11 U/L (ref 0–55)
AST: 16 U/L (ref 5–34)
Albumin: 3 g/dL — ABNORMAL LOW (ref 3.5–5.0)
Alkaline Phosphatase: 85 U/L (ref 40–150)
Anion Gap: 8 meq/L (ref 3–11)
BUN: 7.2 mg/dL (ref 7.0–26.0)
CO2: 25 meq/L (ref 22–29)
Calcium: 9.2 mg/dL (ref 8.4–10.4)
Chloride: 108 meq/L (ref 98–109)
Creatinine: 0.7 mg/dL (ref 0.6–1.1)
EGFR: >90 ml/min/1.73 m2
Glucose: 128 mg/dL (ref 70–140)
Potassium: 3.7 meq/L (ref 3.5–5.1)
Sodium: 141 meq/L (ref 136–145)
Total Bilirubin: <0.3 mg/dL (ref 0.20–1.20)
Total Protein: 6.7 g/dL (ref 6.4–8.3)

## 2015-12-01 MED ORDER — SODIUM CHLORIDE 0.9 % IV SOLN
1000.0000 mg/m2 | Freq: Once | INTRAVENOUS | Status: AC
  Administered 2015-12-01: 1862 mg via INTRAVENOUS
  Filled 2015-12-01: qty 48.97

## 2015-12-01 MED ORDER — PROCHLORPERAZINE MALEATE 10 MG PO TABS: 10.0000 mg | ORAL_TABLET | Freq: Once | ORAL | Status: DC

## 2015-12-01 MED ORDER — SODIUM CHLORIDE 0.9 % IV SOLN
Freq: Once | INTRAVENOUS | Status: AC
  Administered 2015-12-01: 11:00:00 via INTRAVENOUS

## 2015-12-01 MED ORDER — HEPARIN SOD (PORK) LOCK FLUSH 100 UNIT/ML IV SOLN
500.0000 [IU] | Freq: Once | INTRAVENOUS | Status: AC | PRN
  Administered 2015-12-01: 500 [IU]
  Filled 2015-12-01: qty 5

## 2015-12-01 MED ORDER — SODIUM CHLORIDE 0.9% FLUSH
10.0000 mL | INTRAVENOUS | Status: DC | PRN
  Administered 2015-12-01: 10 mL
  Filled 2015-12-01: qty 10

## 2015-12-01 NOTE — Patient Instructions (Signed)
[facility] Discharge Instructions for Patients Receiving Chemotherapy  Today you received the following chemotherapy agents Gemzar  To help prevent nausea and vomiting after your treatment, we encourage you to take your nausea medication as directed.    If you develop nausea and vomiting that is not controlled by your nausea medication, call the clinic.   BELOW ARE SYMPTOMS THAT SHOULD BE REPORTED IMMEDIATELY:  *FEVER GREATER THAN 100.5 F  *CHILLS WITH OR WITHOUT FEVER  NAUSEA AND VOMITING THAT IS NOT CONTROLLED WITH YOUR NAUSEA MEDICATION  *UNUSUAL SHORTNESS OF BREATH  *UNUSUAL BRUISING OR BLEEDING  TENDERNESS IN MOUTH AND THROAT WITH OR WITHOUT PRESENCE OF ULCERS  *URINARY PROBLEMS  *BOWEL PROBLEMS  UNUSUAL RASH Items with * indicate a potential emergency and should be followed up as soon as possible.  Feel free to call the clinic you have any questions or concerns. The clinic phone number is [phone].  Please show the CHEMO ALERT CARD at check-in to the Emergency Department and triage nurse.   

## 2015-12-08 ENCOUNTER — Encounter: Payer: Self-pay | Admitting: Hematology

## 2015-12-08 ENCOUNTER — Ambulatory Visit: Payer: Medicare HMO

## 2015-12-08 ENCOUNTER — Encounter: Payer: Self-pay | Admitting: *Deleted

## 2015-12-08 ENCOUNTER — Other Ambulatory Visit (HOSPITAL_BASED_OUTPATIENT_CLINIC_OR_DEPARTMENT_OTHER): Payer: Medicare HMO

## 2015-12-08 ENCOUNTER — Other Ambulatory Visit: Payer: Self-pay | Admitting: *Deleted

## 2015-12-08 ENCOUNTER — Ambulatory Visit (HOSPITAL_BASED_OUTPATIENT_CLINIC_OR_DEPARTMENT_OTHER): Payer: Medicare HMO | Admitting: Hematology

## 2015-12-08 ENCOUNTER — Ambulatory Visit (HOSPITAL_BASED_OUTPATIENT_CLINIC_OR_DEPARTMENT_OTHER): Payer: Medicare HMO

## 2015-12-08 VITALS — BP 144/62 | HR 63 | Temp 98.1°F | Resp 18 | Wt 146.3 lb

## 2015-12-08 DIAGNOSIS — R630 Anorexia: Secondary | ICD-10-CM | POA: Diagnosis not present

## 2015-12-08 DIAGNOSIS — Z95828 Presence of other vascular implants and grafts: Secondary | ICD-10-CM

## 2015-12-08 DIAGNOSIS — Z5112 Encounter for antineoplastic immunotherapy: Secondary | ICD-10-CM

## 2015-12-08 DIAGNOSIS — E46 Unspecified protein-calorie malnutrition: Secondary | ICD-10-CM | POA: Diagnosis not present

## 2015-12-08 DIAGNOSIS — C221 Intrahepatic bile duct carcinoma: Secondary | ICD-10-CM

## 2015-12-08 LAB — CBC & DIFF AND RETIC
BASO%: 1.2 % (ref 0.0–2.0)
Basophils Absolute: 0 10*3/uL (ref 0.0–0.1)
EOS%: 0 % (ref 0.0–7.0)
Eosinophils Absolute: 0 10*3/uL (ref 0.0–0.5)
HCT: 27.6 % — ABNORMAL LOW (ref 34.8–46.6)
HGB: 9 g/dL — ABNORMAL LOW (ref 11.6–15.9)
Immature Retic Fract: 21.7 % — ABNORMAL HIGH (ref 1.60–10.00)
LYMPH%: 38.2 % (ref 14.0–49.7)
MCH: 30 pg (ref 25.1–34.0)
MCHC: 32.6 g/dL (ref 31.5–36.0)
MCV: 92 fL (ref 79.5–101.0)
MONO#: 0.5 10*3/uL (ref 0.1–0.9)
MONO%: 18.3 % — ABNORMAL HIGH (ref 0.0–14.0)
NEUT#: 1 10*3/uL — ABNORMAL LOW (ref 1.5–6.5)
NEUT%: 42.3 % (ref 38.4–76.8)
Platelets: 264 10*3/uL (ref 145–400)
RBC: 3 10*6/uL — ABNORMAL LOW (ref 3.70–5.45)
RDW: 17.1 % — ABNORMAL HIGH (ref 11.2–14.5)
Retic %: 0.9 % (ref 0.70–2.10)
Retic Ct Abs: 27 10*3/uL — ABNORMAL LOW (ref 33.70–90.70)
WBC: 2.5 10*3/uL — ABNORMAL LOW (ref 3.9–10.3)
lymph#: 0.9 10*3/uL (ref 0.9–3.3)

## 2015-12-08 LAB — COMPREHENSIVE METABOLIC PANEL
ALT: 16 U/L (ref 0–55)
AST: 23 U/L (ref 5–34)
Albumin: 3 g/dL — ABNORMAL LOW (ref 3.5–5.0)
Alkaline Phosphatase: 84 U/L (ref 40–150)
Anion Gap: 11 mEq/L (ref 3–11)
BUN: 8.1 mg/dL (ref 7.0–26.0)
CO2: 25 mEq/L (ref 22–29)
Calcium: 9.4 mg/dL (ref 8.4–10.4)
Chloride: 103 mEq/L (ref 98–109)
Creatinine: 0.7 mg/dL (ref 0.6–1.1)
EGFR: >90 mL/min/{1.73_m2} (ref >90)
Glucose: 128 mg/dl (ref 70–140)
Potassium: 3.3 mEq/L — ABNORMAL LOW (ref 3.5–5.1)
Sodium: 139 mEq/L (ref 136–145)
Total Bilirubin: <0.3 mg/dL (ref 0.20–1.20)
Total Protein: 7 g/dL (ref 6.4–8.3)

## 2015-12-08 MED ORDER — SODIUM CHLORIDE 0.9 % IJ SOLN
10.0000 mL | INTRAMUSCULAR | Status: DC | PRN
  Administered 2015-12-08: 10 mL via INTRAVENOUS
  Filled 2015-12-08: qty 10

## 2015-12-08 MED ORDER — PROCHLORPERAZINE MALEATE 10 MG PO TABS
ORAL_TABLET | ORAL | Status: AC
  Filled 2015-12-08: qty 1

## 2015-12-08 MED ORDER — HEPARIN SOD (PORK) LOCK FLUSH 100 UNIT/ML IV SOLN
500.0000 [IU] | Freq: Once | INTRAVENOUS | Status: AC | PRN
  Administered 2015-12-08: 500 [IU]
  Filled 2015-12-08: qty 5

## 2015-12-08 MED ORDER — SODIUM CHLORIDE 0.9% FLUSH
10.0000 mL | INTRAVENOUS | Status: DC | PRN
  Administered 2015-12-08: 10 mL
  Filled 2015-12-08: qty 10

## 2015-12-08 MED ORDER — PROCHLORPERAZINE MALEATE 10 MG PO TABS
10.0000 mg | ORAL_TABLET | Freq: Once | ORAL | Status: AC
  Administered 2015-12-08: 10 mg via ORAL

## 2015-12-08 MED ORDER — SODIUM CHLORIDE 0.9 % IV SOLN
Freq: Once | INTRAVENOUS | Status: AC
  Administered 2015-12-08: 11:00:00 via INTRAVENOUS

## 2015-12-08 MED ORDER — GEMCITABINE HCL CHEMO INJECTION 1 GM/26.3ML
1000.0000 mg/m2 | Freq: Once | INTRAVENOUS | Status: AC
  Administered 2015-12-08: 1862 mg via INTRAVENOUS
  Filled 2015-12-08: qty 48.97

## 2015-12-08 NOTE — Progress Notes (Signed)
Ok to treat per MD Irene Limbo with CBC. This will be patient's last treatment.

## 2015-12-08 NOTE — Telephone Encounter (Signed)
err

## 2015-12-08 NOTE — Patient Instructions (Signed)
[facility] Discharge Instructions for Patients Receiving Chemotherapy  Today you received the following chemotherapy agents Gemzar  To help prevent nausea and vomiting after your treatment, we encourage you to take your nausea medication as directed.    If you develop nausea and vomiting that is not controlled by your nausea medication, call the clinic.   BELOW ARE SYMPTOMS THAT SHOULD BE REPORTED IMMEDIATELY:  *FEVER GREATER THAN 100.5 F  *CHILLS WITH OR WITHOUT FEVER  NAUSEA AND VOMITING THAT IS NOT CONTROLLED WITH YOUR NAUSEA MEDICATION  *UNUSUAL SHORTNESS OF BREATH  *UNUSUAL BRUISING OR BLEEDING  TENDERNESS IN MOUTH AND THROAT WITH OR WITHOUT PRESENCE OF ULCERS  *URINARY PROBLEMS  *BOWEL PROBLEMS  UNUSUAL RASH Items with * indicate a potential emergency and should be followed up as soon as possible.  Feel free to call the clinic you have any questions or concerns. The clinic phone number is [phone].  Please show the CHEMO ALERT CARD at check-in to the Emergency Department and triage nurse.   

## 2015-12-08 NOTE — Progress Notes (Signed)
Okay to treat today despite Mandan per Darden Dates, RN note.

## 2015-12-12 ENCOUNTER — Other Ambulatory Visit: Payer: Self-pay | Admitting: Hematology

## 2015-12-15 ENCOUNTER — Other Ambulatory Visit: Payer: Self-pay | Admitting: *Deleted

## 2015-12-15 MED ORDER — TRAMADOL HCL 50 MG PO TABS: 50.0000 mg | ORAL_TABLET | Freq: Four times a day (QID) | ORAL | 0 refills | Status: DC | PRN

## 2015-12-15 MED ORDER — DRONABINOL 5 MG PO CAPS: 5.0000 mg | ORAL_CAPSULE | Freq: Three times a day (TID) | ORAL | 0 refills | Status: DC

## 2015-12-25 NOTE — Progress Notes (Signed)
Bonnie Peterson    HEMATOLOGY/ONCOLOGY CLINIC NOTE  Date of Service: 12/08/2015    Patient Care Team: Veatrice Bourbon, MD as PCP - General (Family Medicine) Aquilla Hacker, MD (Inactive) as Resident (Family Medicine)  CHIEF COMPLAINTS/PURPOSE OF CONSULTATION:   F/u for Cholangiocarcinoma s/p completion of concurrent chemo-Radiation.  DIAGNOSIS  Stage IVA (T2a, pN1, M0) Intrahepatic Cholangiocarcinoma  Current Treatment  Plan for adjuvant gemcitabine x 4 months  Previous treatment 02/09/15 L lobe of liver partial hepatectomy segments 2, 3, 4a, 4b invasive cholangiocarcinoma, moderately differentiated. Tumor focally extends to the parenchymal margin of resection. Uninvolved liver parenchyma with steatohepatitis and associated periportal and centrilobular pericellular fibrosis. Scattered von Meyenburg complexes. One hepatic artery LN with metastatic cholangiocarcinoma. Fibroadipose tissue and nerve negative for malignancy. Gallbladder negative for malignancy. One LN negative for malignancy. T2aN1 invasive cholangiocarcinoma, moderately differentiated, with positive hepatic parenchymal margin, and 1/2 nodes positive for tumor involvement.  -Concurrent Capecitabine 879m/m2 po BID M-F while on concurrent RT Final date of RT completed 05/25/2015 Patient  had developed grade 2-3 radiation gastritis with some functional gastric outlet obstruction which has now resolved.  Interval History  Ms. MClawsonis here for follow her scheduled followup for monitoring of her adjuvant gemcitabine chemotherapy for metastatic intrahepatic cholangiocarcinoma.  She notes some increased fatigue grade 1 due to chemotherapy.  Notes decreased appetite. Marinol is helping some with the nausea and food intake. No acute new symptoms. No fevers or chills.  Patient notes no new abdominal pain.  No other acute new focal symptoms.  MEDICAL HISTORY:  Past Medical History:  Diagnosis Date  . Anxiety   . Benign positional vertigo    . Cancer ([place] 02/09/15   intrahepatic cholangiocarcinoma  . COPD (chronic obstructive pulmonary disease) ([facility]   . Depression   . Diabetes mellitus without complication ([place]   . Early cataracts, bilateral 10/13   Optho, Dr SGershon Crane . Echocardiogram findings abnormal, without diagnosis 10/10   10/10: mild pulm HTN, EF 60-65%, mild LVH, moderate aortic regurg  . GERD (gastroesophageal reflux disease)    I have "acid reflux"  . Gout   . HLD (hyperlipidemia)   . HTN, goal below 130/80   . Irritable bowel syndrome   . Obesity, Class III, BMI 40-49.9 (morbid obesity) ([place]   . Obstructive sleep apnea    wears cpap  . Osteoarthritis (arthritis due to wear and tear of joints)    also gout  . Restrictive lung disease    SURGICAL HISTORY: Past Surgical History:  Procedure Laterality Date  . ABDOMINAL HYSTERECTOMY    . ESOPHAGOGASTRODUODENOSCOPY N/A 06/15/2015   Procedure: ESOPHAGOGASTRODUODENOSCOPY (EGD);  Surgeon: CGatha Mayer MD;  [facility];  Service: Endoscopy;  Laterality: N/A;  . LIVER BIOPSY    . OPEN PARTIAL HEPATECTOMY  Left 02/09/15  . PARTIAL HYSTERECTOMY    . ROTATOR CUFF REPAIR       L rotator cuff repair 11/07-[facility] - 12/7/200  . TONSILLECTOMY      SOCIAL HISTORY: Social History   Social History  . Marital status: Single    Spouse name: N/A  . Number of children: 4  . Years of education: N/A   Occupational History  . unemployed    Social History Main Topics  . Smoking status: Current Some Day Smoker    Packs/day: 0.50    Years: 40.00    Types: Cigarettes  . Smokeless tobacco: Never Used  . Alcohol use 0.0 oz/week     Comment: occasionally  .  Drug use:     Types: Marijuana  . Sexual activity: No   Other Topics Concern  . 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 Breast cancer Mother   . Hypertension Mother   . Coronary artery disease Mother   . Diabetes type II Mother   . Diabetes type II Sister   . Pancreatic cancer Sister     ALLERGIES:  is allergic to other.  MEDICATIONS:  Current Outpatient Prescriptions  Medication Sig Dispense Refill  . allopurinol (ZYLOPRIM) 100 MG tablet Take 1 tablet (100 mg total) by mouth daily. 30 tablet 1  . azithromycin (ZITHROMAX) 250 MG tablet 566m (2 tabs) on day 1 and then 2540m(1tab) daily for 4 days 6 each 0  . cholecalciferol (VITAMIN D) 1000 UNITS tablet Take 1 tablet (1,000 Units total) by mouth daily. 30 tablet 11  . diclofenac sodium (VOLTAREN) 1 % GEL Apply 2 g topically 4 (four) times daily as needed. (Patient taking differently: Apply 2 g topically 4 (four) times daily as needed (knee pain, elbow and hands). ) 100 g 4  . dronabinol (MARINOL) 5 MG capsule Take 1 capsule (5 mg total) by mouth 3 (three) times daily before meals. 90 capsule 0  . fexofenadine (ALLEGRA) 30 MG tablet Take 30 mg by mouth 2 (two) times daily.    . fish oil-omega-3 fatty acids 1000 MG capsule Take 1 g by mouth every 30 (thirty) days.     . fluticasone (FLONASE) 50 MCG/ACT nasal spray Place 1 spray into the nose daily as needed for rhinitis.    . Hyprom-Naphaz-Polysorb-Zn Sulf (CLEAR EYES COMPLETE OP) Place 1 drop into both eyes daily as needed (dry eyes).     . Lidocaine-Prilocaine, Bulk, 2.5-2.5 % CREA Apply 5 mLs topically as needed (for port-a-cath use). 1 hour before use, cover with Band aid or plastic wrap to seal 30 g prn  . loratadine (CLARITIN) 10 MG tablet Take 1 tablet (10 mg total) by mouth daily as needed for allergies. 30 tablet 3  . Multiple Vitamin (MULTIVITAMIN WITH MINERALS) TABS tablet Take 1 tablet by  mouth daily.    . ondansetron (ZOFRAN) 8 MG tablet Take by mouth every 8 (eight) hours as needed for nausea or vomiting.    . pantoprazole (PROTONIX) 40 MG tablet Take 40 mg by mouth daily.    . Bonnie KitchenROAIR HFA 108 (90 Base) MCG/ACT inhaler INHALE 2 PUFFS INTO THE LUNGS EVERY 4 HOURS AS NEEDED FOR WHEEZING OR SHORTNESS OF BREATH 1 Inhaler 0  . tiotropium (SPIRIVA) 18 MCG inhalation capsule Place 1 capsule (18 mcg total) into inhaler and inhale as needed (if its hot outside). 30 capsule 5  . traMADol (ULTRAM) 50 MG tablet Take 1 tablet (50 mg total) by mouth every 6 (six) hours as needed for moderate pain or severe pain. 60 tablet 0  . VITAMIN E PO Take 1 tablet by mouth daily.      No current facility-administered medications for this visit.     REVIEW OF SYSTEMS:  10 Point review of Systems was done is negative except as noted above.  PHYSICAL EXAMINATION: ECOG PERFORMANCE STATUS: 1 - Symptomatic but completely ambulatory Vital signs reviewed in Epic Afebrile temperature 98.6, breathing at 18, pulse 66/m, blood pressure 155/58.  GENERAL:alert, in no acute distress and comfortable SKIN: skin color, texture, turgor are normal, no rashes or significant lesions EYES: normal, conjunctiva are pink and non-injected, sclera clear OROPHARYNX: Mucous membranes moist, oral thrush resolved. NECK: supple, no JVD, thyroid normal size, non-tender, without nodularity LYMPH:  no palpable lymphadenopathy in the cervical, axillary or inguinal LUNGS: clear to auscultation with normal respiratory effort HEART: regular rate & rhythm,  no murmurs and no lower extremity edema ABDOMEN: abdomen Soft with normoactive bowel sounds, No guarding rigidity or rebound. Musculoskeletal: no cyanosis of digits and no clubbing  PSYCH: alert & oriented x 3 with fluent speech NEURO: no focal motor/sensory deficits  LABORATORY DATA:  . CBC Latest Ref Rng & Units 12/08/2015 12/01/2015 11/17/2015  WBC 3.9 - 10.3 10e3/uL 2.5(L)  5.3 2.6(L)  Hemoglobin 11.6 - 15.9 g/dL 9.0(L) 9.7(L) 9.2(L)  Hematocrit 34.8 - 46.6 % 27.6(L) 30.0(L) 28.3(L)  Platelets 145 - 400 10e3/uL 264 281 142(L)   . CBC    Component Value Date/Time   WBC 2.5 (L) 12/08/2015 0917   WBC 6.3 09/07/2015 0850   RBC 3.00 (L) 12/08/2015 0917   RBC 4.28 09/07/2015 0850   HGB 9.0 (L) 12/08/2015 0917   HCT 27.6 (L) 12/08/2015 0917   PLT 264 12/08/2015 0917   MCV 92.0 12/08/2015 0917   MCH 30.0 12/08/2015 0917   MCH 28.7 09/07/2015 0850   MCHC 32.6 12/08/2015 0917   MCHC 31.5 09/07/2015 0850   RDW 17.1 (H) 12/08/2015 0917   LYMPHSABS 0.9 12/08/2015 0917   MONOABS 0.5 12/08/2015 0917   EOSABS 0.0 12/08/2015 0917   BASOSABS 0.0 12/08/2015 0917   . CMP Latest Ref Rng & Units 12/08/2015 12/01/2015 11/17/2015  Glucose 70 - 140 mg/dl 128 128 128  BUN 7.0 - 26.0 mg/dL 8.1 7.2 8.3  Creatinine 0.6 - 1.1 mg/dL 0.7 0.7 0.6  Sodium 136 - 145 mEq/L 139 141 140  Potassium 3.5 - 5.1 mEq/L 3.3(L) 3.7 3.7  Chloride 101 - 111 mmol/L - - -  CO2 22 - 29 mEq/L 25 25 26   Calcium 8.4 - 10.4 mg/dL 9.4 9.2 9.3  Total Protein 6.4 - 8.3 g/dL 7.0 6.7 6.6  Total Bilirubin 0.20 - 1.20 mg/dL <0.30 <0.30 <0.30  Alkaline Phos 40 - 150 U/L 84 85 83  AST 5 - 34 U/L 23 16 19   ALT 0 - 55 U/L 16 11 14    RADIOGRAPHIC STUDIES: I have personally reviewed the radiological images as listed and agreed with the findings in the report.  CT chest abdomen pelvis 10/16/2015:  Impression: 1. Status post left hepatectomy with no evidence of recurrent or metastatic disease. 2. Right lower lobe pulmonary nodule (4 mm), unchanged from 11/17/2014. Attention on follow-up is recommended.  Electronically Reviewed AC:ZYSAYTK Enslow, MD Electronically Reviewed on:10/16/2015 3:31 PM    ASSESSMENT & PLAN:   70 yo with multiple medical co-morbids  1) Intrahepatic invasive cholangiocarcinoma T2a N1 M0 (Stage IVA) with focal positive parenchymal margins and 1/2 LN positive. CT C/A/P on  04/03/2016 showed no evidence of cholangiocarcinoma local recurrence or metastases at this time. Patient  has completed her adjuvant concurrent chemo-radiation  on 05/25/2015 . She did overall quite well with treatment without any overt prohibitive toxicities. Did develop some  grade 2 radiation gastritis causing functional gastric outlet obstruction.  Required treatment with high-dose steroids and dietary modifications.  Symptoms progressive to have resolved.  Currently on PPI for overt symptoms. CT chest abdomen pelvis in March 2017 showed no evidence of disease progression. Patient was seen at [facility] by Dr. Dennison Nancy and was given the option of observation versus adjuvant gemcitabine. She had a followup at [facility] with a CT Chest abdomen pelvis on 10/16/2015 which showed no evidence of recurrent or metastatic disease.  2) grade 1 nausea with gemcitabine 3)grade 1-2 fatigue.  Plan  -no prohibitive toxicities from gemcitabine. Labs acceptable. -No clinical evidence of disease progression at this time -ok to proceed with gemcitabine C5 D8 of adjuvant chemotherapy. After this the patient would have completed her adjuvant chemotherapy. -She was encouraged to maintain good physical activity level and optimize oral intake. -We shall repeat CT chest abdomen and pelvis in 6 weeks for followup post adjuvant chemotherapy.  2) Mild thrombocytopenia-likely due to chemotherapy -resolved   3)h/o Grade 2 radiation gastritis with functional gastrointestinal obstruction that previously caused significant nausea or vomiting.  Resolved.  4) Acute Bronchitis /Reactive airway disease . No fevers/chills.--This is now resolved -counseled on smoking cessation.  5)Chemotherapy related nausea and poor appetite. Patient is responding well to Marinol. Continue on Marinol dose to 5 mg by mouth three a day. Monitor for confusion  RTC with Dr Irene Limbo in 6 weeks with cbc, cmp and CT chest/abd/pelvis. Earlier if any new  concerns   Sullivan Lone MD [place] A[facility]IVMS [facility] Hematology/Oncology Physician [facility]  (Office):       [phone] (Work cell):  [phone] (Fax):           [phone]

## 2016-01-10 ENCOUNTER — Telehealth: Payer: Self-pay | Admitting: *Deleted

## 2016-01-10 NOTE — Telephone Encounter (Signed)
FYI Voicemail received requesting "scheduling of scans and when do I see Dr.Kale".  Returned call providing Radiology scheduling number.  With this call she reports "shoulder pain started last Thursday that is not the gout.  I have a defibrillator and when I raise my arm touching my ear it hurts.  I can't reach my arm above my head.  I need to know if I can receive the flu vaccine." Next scheduled F/U is January 19, 2016.  Will discuss at this visit.  [facility] provider October 17th.

## 2016-01-16 ENCOUNTER — Other Ambulatory Visit: Payer: Self-pay | Admitting: *Deleted

## 2016-01-16 DIAGNOSIS — C221 Intrahepatic bile duct carcinoma: Secondary | ICD-10-CM

## 2016-01-17 ENCOUNTER — Ambulatory Visit (HOSPITAL_COMMUNITY)
Admission: RE | Admit: 2016-01-17 | Discharge: 2016-01-17 | Disposition: A | Discharge status: Home / Self Care | Payer: Medicare HMO | Source: Ambulatory Visit | Attending: Hematology | Admitting: Hematology

## 2016-01-17 DIAGNOSIS — C221 Intrahepatic bile duct carcinoma: Secondary | ICD-10-CM | POA: Diagnosis not present

## 2016-01-17 DIAGNOSIS — Z9889 Other specified postprocedural states: Secondary | ICD-10-CM | POA: Insufficient documentation

## 2016-01-17 MED ORDER — IOPAMIDOL (ISOVUE-300) INJECTION 61%
100.0000 mL | Freq: Once | INTRAVENOUS | Status: AC | PRN
  Administered 2016-01-17: 100 mL via INTRAVENOUS

## 2016-01-19 ENCOUNTER — Other Ambulatory Visit (HOSPITAL_BASED_OUTPATIENT_CLINIC_OR_DEPARTMENT_OTHER): Payer: Medicare HMO

## 2016-01-19 ENCOUNTER — Ambulatory Visit (HOSPITAL_BASED_OUTPATIENT_CLINIC_OR_DEPARTMENT_OTHER): Payer: Medicare HMO | Admitting: Hematology

## 2016-01-19 ENCOUNTER — Encounter: Payer: Self-pay | Admitting: Hematology

## 2016-01-19 VITALS — BP 155/55 | HR 57 | Temp 97.6°F | Resp 18 | Wt 147.8 lb

## 2016-01-19 DIAGNOSIS — M25511 Pain in right shoulder: Secondary | ICD-10-CM

## 2016-01-19 DIAGNOSIS — C221 Intrahepatic bile duct carcinoma: Secondary | ICD-10-CM

## 2016-01-19 DIAGNOSIS — Z23 Encounter for immunization: Secondary | ICD-10-CM

## 2016-01-19 LAB — COMPREHENSIVE METABOLIC PANEL
ALT: 6 U/L (ref 0–55)
AST: 13 U/L (ref 5–34)
Albumin: 3.1 g/dL — ABNORMAL LOW (ref 3.5–5.0)
Alkaline Phosphatase: 105 U/L (ref 40–150)
Anion Gap: 7 mEq/L (ref 3–11)
BUN: 8.5 mg/dL (ref 7.0–26.0)
CO2: 27 mEq/L (ref 22–29)
Calcium: 9.8 mg/dL (ref 8.4–10.4)
Chloride: 106 mEq/L (ref 98–109)
Creatinine: 0.7 mg/dL (ref 0.6–1.1)
EGFR: >90 mL/min/{1.73_m2} (ref >90)
Glucose: 100 mg/dl (ref 70–140)
Potassium: 4.9 mEq/L (ref 3.5–5.1)
Sodium: 141 mEq/L (ref 136–145)
Total Bilirubin: <0.22 mg/dL (ref 0.20–1.20)
Total Protein: 7.4 g/dL (ref 6.4–8.3)

## 2016-01-19 LAB — CBC & DIFF AND RETIC
BASO%: 0.1 % (ref 0.0–2.0)
Basophils Absolute: 0 10*3/uL (ref 0.0–0.1)
EOS%: 0.9 % (ref 0.0–7.0)
Eosinophils Absolute: 0.1 10*3/uL (ref 0.0–0.5)
HCT: 36.8 % (ref 34.8–46.6)
HGB: 11.7 g/dL (ref 11.6–15.9)
Immature Retic Fract: 3.6 % (ref 1.60–10.00)
LYMPH%: 22.8 % (ref 14.0–49.7)
MCH: 29.7 pg (ref 25.1–34.0)
MCHC: 31.8 g/dL (ref 31.5–36.0)
MCV: 93.4 fL (ref 79.5–101.0)
MONO#: 0.5 10*3/uL (ref 0.1–0.9)
MONO%: 6.7 % (ref 0.0–14.0)
NEUT#: 4.7 10*3/uL (ref 1.5–6.5)
NEUT%: 69.5 % (ref 38.4–76.8)
Platelets: 169 10*3/uL (ref 145–400)
RBC: 3.94 10*6/uL (ref 3.70–5.45)
RDW: 15.2 % — ABNORMAL HIGH (ref 11.2–14.5)
Retic %: 1.59 % (ref 0.70–2.10)
Retic Ct Abs: 62.65 10*3/uL (ref 33.70–90.70)
WBC: 6.7 10*3/uL (ref 3.9–10.3)
lymph#: 1.5 10*3/uL (ref 0.9–3.3)

## 2016-01-19 MED ORDER — INFLUENZA VAC SPLIT QUAD 0.5 ML IM SUSY
0.5000 mL | PREFILLED_SYRINGE | Freq: Once | INTRAMUSCULAR | Status: AC
  Administered 2016-01-19: 0.5 mL via INTRAMUSCULAR
  Filled 2016-01-19: qty 0.5

## 2016-01-19 NOTE — Patient Instructions (Signed)

## 2016-01-23 ENCOUNTER — Ambulatory Visit (INDEPENDENT_AMBULATORY_CARE_PROVIDER_SITE_OTHER): Payer: Medicare HMO | Admitting: Student

## 2016-01-23 ENCOUNTER — Encounter: Payer: Self-pay | Admitting: Student

## 2016-01-23 VITALS — BP 138/60 | HR 75 | Temp 98.0°F | Wt 147.0 lb

## 2016-01-23 DIAGNOSIS — I10 Essential (primary) hypertension: Secondary | ICD-10-CM | POA: Diagnosis not present

## 2016-01-23 DIAGNOSIS — Z0000 Encounter for general adult medical examination without abnormal findings: Secondary | ICD-10-CM | POA: Diagnosis not present

## 2016-01-23 DIAGNOSIS — R739 Hyperglycemia, unspecified: Secondary | ICD-10-CM | POA: Diagnosis not present

## 2016-01-23 DIAGNOSIS — E119 Type 2 diabetes mellitus without complications: Secondary | ICD-10-CM

## 2016-01-23 MED ORDER — ALLOPURINOL 100 MG PO TABS: 100.0000 mg | ORAL_TABLET | Freq: Every day | ORAL | 1 refills | Status: DC

## 2016-01-23 MED ORDER — ASPIRIN EC 81 MG PO TBEC: 81.0000 mg | DELAYED_RELEASE_TABLET | Freq: Every day | ORAL | 2 refills | Status: DC

## 2016-01-23 NOTE — Progress Notes (Signed)
   Subjective:    Patient ID: Bonnie Peterson, female    DOB: 01/29/1946, 70 y.o.   MRN: [id number]   CC: Physical exam, flu shot  HPI: 70 y/o F with PMH significant for hepatic cholangiocarcinoma presents for follow up   Cholagiocarcinoma - being treated at [facility], and reports she has not had tumor recurrenence over the last three months - reports she has had good appetite, denies nausea,. vomiting  HTN - reports that she takes enalapril, norvasc and atenolol - she denies SOB, chest pain  Diabetes - last A1c 11/22/2015 was 5.9 from 7.4 on 06/14/2015 - she has been controlling her DM with diet - wears protective foot wear at all times   Smoking status reviewed - former smoker  Review of Systems  Per HPI, else denies recent illness, fever, headache, changes in vision, chest pain, shortness of breath, abdominal pain, N/V/D, weakness    Objective:  BP 138/60   Pulse 75   Temp 98 F (36.7 C) (Oral)   Wt 147 lb (66.7 kg)   SpO2 98%   BMI 26.04 kg/m  Vitals and nursing note reviewed  General: NAD Cardiac: RRR, Respiratory: CTAB, normal effort Extremities: no edema or cyanosis. See quality metric for foot exam. Skin: warm and dry Neuro: alert and oriented   Assessment & Plan:    HYPERTENSION, BENIGN SYSTEMIC Blood pressure within normal limits on atenolol, enalapril and amlodipine - will continue current regimen - those meds have fallen off of her medication list, so will ask patient to bring all current medications to next visit to better reconcile them   Diabetes mellitus, type 2 ([place]) A1c 5.9 on 8/19  medications. Likely improved with weight loss after cancer diagnosis and treatment - healthy diet and exercise discussed.  - A1c in one month, if still improved will space A1c checks to once per month  Healthcare maintenance Flu shot today, lipid panel today    Kaimana Neuzil A. Lincoln Brigham MD, [facility] Resident PGY-3 Pager [phone]

## 2016-01-23 NOTE — Patient Instructions (Signed)
Follow up in 1 month for A1c Please bring all of your medications at this visit If you have any questions or concerns, call the office at [phone]

## 2016-01-24 DIAGNOSIS — Z0000 Encounter for general adult medical examination without abnormal findings: Secondary | ICD-10-CM | POA: Insufficient documentation

## 2016-01-24 LAB — LIPID PANEL
Cholesterol: 184 mg/dL (ref 125–200)
HDL: 32 mg/dL — ABNORMAL LOW (ref >=46)
LDL Cholesterol: 116 mg/dL (ref <130)
Total CHOL/HDL Ratio: 5.8 Ratio — ABNORMAL HIGH (ref <=5.0)
Triglycerides: 182 mg/dL — ABNORMAL HIGH (ref <150)
VLDL: 36 mg/dL — ABNORMAL HIGH (ref <30)

## 2016-01-24 NOTE — Assessment & Plan Note (Signed)
Blood pressure within normal limits on atenolol, enalapril and amlodipine - will continue current regimen - those meds have fallen off of her medication list, so will ask patient to bring all current medications to next visit to better reconcile them

## 2016-01-24 NOTE — Assessment & Plan Note (Addendum)
A1c 5.9 on 8/19  medications. Likely improved with weight loss after cancer diagnosis and treatment - healthy diet and exercise discussed.  - A1c in one month, if still improved will space A1c checks to once per year

## 2016-01-24 NOTE — Assessment & Plan Note (Signed)
Flu shot today, lipid panel today

## 2016-01-29 NOTE — Progress Notes (Signed)
Bonnie Peterson    HEMATOLOGY/ONCOLOGY CLINIC NOTE  Date of Service: 01/19/2016    Patient Care Team: Veatrice Bourbon, MD as PCP - General (Family Medicine) Aquilla Hacker, MD (Inactive) as Resident (Family Medicine)  CHIEF COMPLAINTS/PURPOSE OF CONSULTATION:   F/u for Cholangiocarcinoma s/p completion of concurrent chemo-Radiation.  DIAGNOSIS  Stage IVA (T2a, pN1, M0) Intrahepatic Cholangiocarcinoma  Current Treatment  Active Surveillance  Previous treatment 02/09/15 L lobe of liver partial hepatectomy segments 2, 3, 4a, 4b invasive cholangiocarcinoma, moderately differentiated. Tumor focally extends to the parenchymal margin of resection. Uninvolved liver parenchyma with steatohepatitis and associated periportal and centrilobular pericellular fibrosis. Scattered von Meyenburg complexes. One hepatic artery LN with metastatic cholangiocarcinoma. Fibroadipose tissue and nerve negative for malignancy. Gallbladder negative for malignancy. One LN negative for malignancy. T2aN1 invasive cholangiocarcinoma, moderately differentiated, with positive hepatic parenchymal margin, and 1/2 nodes positive for tumor involvement.  -Concurrent Capecitabine 87m/m2 po BID M-F while on concurrent RT Final date of RT completed 05/25/2015 Patient  had developed grade 2-3 radiation gastritis with some functional gastric outlet obstruction which has now resolved.   Completed adjuvant gemcitabine x 4 months  Interval History  Ms. MRoesis here for follow her scheduled followup after completion of her 4 months of adjuvant Gemcitabine. Patient notes no new abdominal pain.  No other acute new focal symptoms. Restaging CT no evidence of recurrent/metastatic disease. Patient notes that she is eating better and feeling well. She is glad to know that her scan are neg.    MEDICAL HISTORY:  Past Medical History:  Diagnosis Date  . Anxiety   . Benign positional vertigo   . Cancer ([place] 02/09[place]15   intrahepatic  cholangiocarcinoma  . COPD (chronic obstructive pulmonary disease) ([facility]   . Depression   . Diabetes mellitus without complication ([place]   . Early cataracts, bilateral 10/13   Optho, Dr SGershon Crane . Echocardiogram findings abnormal, without diagnosis 10/10   10/10: mild pulm HTN, EF 60-65%, mild LVH, moderate aortic regurg  . GERD (gastroesophageal reflux disease)    I have "acid reflux"  . Gout   . HLD (hyperlipidemia)   . HTN, goal below 130/80   . Irritable bowel syndrome   . Obesity, Class III, BMI 40-49.9 (morbid obesity) ([facility]   . Obstructive sleep apnea    wears cpap  . Osteoarthritis (arthritis due to wear and tear of joints)    also gout  . Restrictive lung disease    SURGICAL HISTORY: Past Surgical History:  Procedure Laterality Date  . ABDOMINAL HYSTERECTOMY    . ESOPHAGOGASTRODUODENOSCOPY N/A 06/15/2015   Procedure: ESOPHAGOGASTRODUODENOSCOPY (EGD);  Surgeon: CGatha Mayer MD;  [facility];  Service: Endoscopy;  Laterality: N/A;  . LIVER BIOPSY    . OPEN PARTIAL HEPATECTOMY  Left 02/09/15  . PARTIAL HYSTERECTOMY    . ROTATOR CUFF REPAIR       L rotator cuff repair 11/07-[place] - 12/7/200  . TONSILLECTOMY      SOCIAL HISTORY: Social History   Social History  . Marital status: Single    Spouse name: N/A  . Number of children: 4  . Years of education: N/A   Occupational History  . unemployed    Social History Main Topics  . Smoking status: Current Some Day Smoker    Packs/day: 0.50    Years: 40.00    Types: Cigarettes  . Smokeless tobacco: Never Used  . Alcohol use 0.0 oz/week     Comment: occasionally  . Drug use:  Types: Marijuana  . Sexual activity: No   Other Topics Concern  . 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 Breast cancer Mother   . Hypertension Mother   . Coronary artery disease Mother   . Diabetes type II Mother   . Diabetes type II Sister   . Pancreatic cancer Sister     ALLERGIES:  is allergic to other.  MEDICATIONS:  Current Outpatient Prescriptions  Medication Sig Dispense Refill  . cholecalciferol (VITAMIN D) 1000 UNITS tablet Take 1 tablet (1,000 Units total) by mouth daily. 30 tablet 11  . diclofenac sodium (VOLTAREN) 1 % GEL Apply 2 g topically 4 (four) times daily as needed. (Patient taking differently: Apply 2 g topically 4 (four) times daily as needed (knee pain, elbow and hands). ) 100 g 4  . dronabinol (MARINOL) 5 MG capsule Take 1 capsule (5 mg total) by mouth 3 (three) times daily before meals. (Patient not taking: Reported on 01/23/2016) 90 capsule 0  . fexofenadine (ALLEGRA) 30 MG tablet Take 30 mg by mouth 2 (two) times daily.    . fish oil-omega-3 fatty acids 1000 MG capsule Take 1 g by mouth every 30 (thirty) days.     . fluticasone (FLONASE) 50 MCG/ACT nasal spray Place 1 spray into the nose daily as needed for rhinitis.    . Hyprom-Naphaz-Polysorb-Zn Sulf (CLEAR EYES COMPLETE OP) Place 1 drop into both eyes daily as needed (dry eyes).     . Lidocaine-Prilocaine, Bulk, 2.5-2.5 % CREA Apply 5 mLs topically as needed (for port-a-cath use). 1 hour before use, cover with Band aid or plastic wrap to seal 30 g prn  . loratadine (CLARITIN) 10 MG tablet Take 1 tablet (10 mg total) by mouth daily as needed for allergies. 30 tablet 3  . Multiple Vitamin (MULTIVITAMIN WITH MINERALS) TABS tablet Take 1 tablet by mouth daily.    . ondansetron (ZOFRAN) 8 MG tablet Take by mouth every 8 (eight) hours as needed for nausea or vomiting.    . pantoprazole (PROTONIX) 40 MG tablet Take 40 mg by mouth daily.    Bonnie Peterson PROAIR HFA 108 (90  Base) MCG/ACT inhaler INHALE 2 PUFFS INTO THE LUNGS EVERY 4 HOURS AS NEEDED FOR WHEEZING OR SHORTNESS OF BREATH 1 Inhaler 0  . tiotropium (SPIRIVA) 18 MCG inhalation capsule Place 1 capsule (18 mcg total) into inhaler and inhale as needed (if its hot outside). 30 capsule 5  . traMADol (ULTRAM) 50 MG tablet Take 1 tablet (50 mg total) by mouth every 6 (six) hours as needed for moderate pain or severe pain. 60 tablet 0  . VITAMIN E PO Take 1 tablet by mouth daily.     Bonnie Peterson allopurinol (ZYLOPRIM) 100 MG tablet Take 1 tablet (100 mg total) by mouth daily. 30 tablet 1  . aspirin EC 81 MG tablet Take 1 tablet (81 mg total) by mouth daily. 90 tablet 2   No current facility-administered medications for this visit.     REVIEW OF SYSTEMS:    10 Point review of Systems  was done is negative except as noted above.  PHYSICAL EXAMINATION: ECOG PERFORMANCE STATUS: 1 - Symptomatic but completely ambulatory Vital signs reviewed in Epic Afebrile temperature 98.6, breathing at 18, pulse 66/m, blood pressure 155/58.  GENERAL:alert, in no acute distress and comfortable SKIN: skin color, texture, turgor are normal, no rashes or significant lesions EYES: normal, conjunctiva are pink and non-injected, sclera clear OROPHARYNX: Mucous membranes moist, oral thrush resolved. NECK: supple, no JVD, thyroid normal size, non-tender, without nodularity LYMPH:  no palpable lymphadenopathy in the cervical, axillary or inguinal LUNGS: clear to auscultation with normal respiratory effort HEART: regular rate & rhythm,  no murmurs and no lower extremity edema ABDOMEN: abdomen Soft with normoactive bowel sounds, No guarding rigidity or rebound. Musculoskeletal: no cyanosis of digits and no clubbing  PSYCH: alert & oriented x 3 with fluent speech NEURO: no focal motor/sensory deficits  LABORATORY DATA:  . CBC Latest Ref Rng & Units 01/19/2016 12/08/2015 12/01/2015  WBC 3.9 - 10.3 10e3/uL 6.7 2.5(L) 5.3  Hemoglobin 11.6 - 15.9  g/dL 11.7 9.0(L) 9.7(L)  Hematocrit 34.8 - 46.6 % 36.8 27.6(L) 30.0(L)  Platelets 145 - 400 10e3/uL 169 264 281   . CBC    Component Value Date/Time   WBC 6.7 01/19/2016 0939   WBC 6.3 09/07/2015 0850   RBC 3.94 01/19/2016 0939   RBC 4.28 09/07/2015 0850   HGB 11.7 01/19/2016 0939   HCT 36.8 01/19/2016 0939   PLT 169 01/19/2016 0939   MCV 93.4 01/19/2016 0939   MCH 29.7 01/19/2016 0939   MCH 28.7 09/07/2015 0850   MCHC 31.8 01/19/2016 0939   MCHC 31.5 09/07/2015 0850   RDW 15.2 (H) 01/19/2016 0939   LYMPHSABS 1.5 01/19/2016 0939   MONOABS 0.5 01/19/2016 0939   EOSABS 0.1 01/19/2016 0939   BASOSABS 0.0 01/19/2016 0939   . CMP Latest Ref Rng & Units 01/19/2016 12/08/2015 12/01/2015  Glucose 70 - 140 mg/dl 100 128 128  BUN 7.0 - 26.0 mg/dL 8.5 8.1 7.2  Creatinine 0.6 - 1.1 mg/dL 0.7 0.7 0.7  Sodium 136 - 145 mEq/L 141 139 141  Potassium 3.5 - 5.1 mEq/L 4.9 3.3(L) 3.7  Chloride 101 - 111 mmol/L - - -  CO2 22 - 29 mEq/L 27 25 25   Calcium 8.4 - 10.4 mg/dL 9.8 9.4 9.2  Total Protein 6.4 - 8.3 g/dL 7.4 7.0 6.7  Total Bilirubin 0.20 - 1.20 mg/dL <0.22 <0.30 <0.30  Alkaline Phos 40 - 150 U/L 105 84 85  AST 5 - 34 U/L 13 23 16   ALT 0 - 55 U/L 6 16 11    RADIOGRAPHIC STUDIES: I have personally reviewed the radiological images as listed and agreed with the findings in the report.  .Ct Chest W Contrast  Result Date: 01/17/2016 CLINICAL DATA:  Intrahepatic cholangiocarcinoma. EXAM: CT CHEST, ABDOMEN, AND PELVIS WITH CONTRAST TECHNIQUE: Multidetector CT imaging of the chest, abdomen and pelvis was performed following the standard protocol during bolus administration of intravenous contrast. CONTRAST:  161m ISOVUE-300 IOPAMIDOL (ISOVUE-300) INJECTION 61% COMPARISON:  07/07/2015 chest CT.  06/14/2015 abdomen and pelvis CT. FINDINGS: CT CHEST FINDINGS Cardiovascular: Heart size is enlarged. Coronary artery calcification is noted. Atherosclerotic calcification is noted in the wall of the  thoracic aorta. Right Port-A-Cath tip is positioned at the SVC/ RA junction. No pericardial effusion. Mediastinum/Nodes: No mediastinal lymphadenopathy. There is no hilar lymphadenopathy. The esophagus has normal imaging features. There is no axillary lymphadenopathy. Lungs/Pleura: Emphysema as before. 7 x 7 mm posterior right lower lobe  pulmonary nodule is now 7 x 6 mm (image 70 series 4). Linear scarring right middle lobe is unchanged. Atelectasis noted in the lingula. No edema or pleural effusion. No focal airspace consolidation. Musculoskeletal: Bone windows reveal no worrisome lytic or sclerotic osseous lesions. CT ABDOMEN PELVIS FINDINGS Hepatobiliary: Patient is status post left hepatectomy. Obscuration of fat show planes between the hepatic resection margin and distal stomach appear relatively stable as does the apparent wall thickening in the distal stomach. No intrahepatic biliary duct dilatation. Pancreas: No focal mass lesion. No dilatation of the main duct. No intraparenchymal cyst. No peripancreatic edema. Spleen: No splenomegaly. No focal mass lesion. Adrenals/Urinary Tract: No adrenal nodule or mass. Kidneys are unremarkable. No evidence for hydroureter. The urinary bladder appears normal for the degree of distention. Stomach/Bowel: As described above, wall thickening in the gastric antrum is persistent, but stable. Duodenum is normally positioned as is the ligament of Treitz. No small bowel wall thickening. No small bowel dilatation. The terminal ileum is normal. The appendix is normal. No gross colonic mass. No colonic wall thickening. No substantial diverticular change. Vascular/Lymphatic: There is abdominal aortic atherosclerosis without aneurysm. 9 mm short axis hepatoduodenal ligament lymph node is stable. No retroperitoneal lymphadenopathy. No pelvic sidewall lymphadenopathy. Reproductive: Uterus is surgically absent. There is no adnexal mass. Other: No intraperitoneal free fluid.  Musculoskeletal: Bone windows reveal no worrisome lytic or sclerotic osseous lesions. IMPRESSION: 1. Stable exam. Status post left hepatectomy. Obscuration of tissue planes with associated circumferential wall thickening in the gastric antrum unchanged in the interval. This may be related to prior surgery and/or radiation. 2. No new or progressive findings to suggest recurrent disease. Electronically Signed   By: Misty Stanley M.D.   On: 01/17/2016 16:58   Ct Abdomen Pelvis W Contrast  Result Date: 01/17/2016 CLINICAL DATA:  Intrahepatic cholangiocarcinoma. EXAM: CT CHEST, ABDOMEN, AND PELVIS WITH CONTRAST TECHNIQUE: Multidetector CT imaging of the chest, abdomen and pelvis was performed following the standard protocol during bolus administration of intravenous contrast. CONTRAST:  134m ISOVUE-300 IOPAMIDOL (ISOVUE-300) INJECTION 61% COMPARISON:  07/07/2015 chest CT.  06/14/2015 abdomen and pelvis CT. FINDINGS: CT CHEST FINDINGS Cardiovascular: Heart size is enlarged. Coronary artery calcification is noted. Atherosclerotic calcification is noted in the wall of the thoracic aorta. Right Port-A-Cath tip is positioned at the SVC/ RA junction. No pericardial effusion. Mediastinum/Nodes: No mediastinal lymphadenopathy. There is no hilar lymphadenopathy. The esophagus has normal imaging features. There is no axillary lymphadenopathy. Lungs/Pleura: Emphysema as before. 7 x 7 mm posterior right lower lobe pulmonary nodule is now 7 x 6 mm (image 70 series 4). Linear scarring right middle lobe is unchanged. Atelectasis noted in the lingula. No edema or pleural effusion. No focal airspace consolidation. Musculoskeletal: Bone windows reveal no worrisome lytic or sclerotic osseous lesions. CT ABDOMEN PELVIS FINDINGS Hepatobiliary: Patient is status post left hepatectomy. Obscuration of fat show planes between the hepatic resection margin and distal stomach appear relatively stable as does the apparent wall thickening in  the distal stomach. No intrahepatic biliary duct dilatation. Pancreas: No focal mass lesion. No dilatation of the main duct. No intraparenchymal cyst. No peripancreatic edema. Spleen: No splenomegaly. No focal mass lesion. Adrenals/Urinary Tract: No adrenal nodule or mass. Kidneys are unremarkable. No evidence for hydroureter. The urinary bladder appears normal for the degree of distention. Stomach/Bowel: As described above, wall thickening in the gastric antrum is persistent, but stable. Duodenum is normally positioned as is the ligament of Treitz. No small bowel wall thickening. No small bowel  dilatation. The terminal ileum is normal. The appendix is normal. No gross colonic mass. No colonic wall thickening. No substantial diverticular change. Vascular/Lymphatic: There is abdominal aortic atherosclerosis without aneurysm. 9 mm short axis hepatoduodenal ligament lymph node is stable. No retroperitoneal lymphadenopathy. No pelvic sidewall lymphadenopathy. Reproductive: Uterus is surgically absent. There is no adnexal mass. Other: No intraperitoneal free fluid. Musculoskeletal: Bone windows reveal no worrisome lytic or sclerotic osseous lesions. IMPRESSION: 1. Stable exam. Status post left hepatectomy. Obscuration of tissue planes with associated circumferential wall thickening in the gastric antrum unchanged in the interval. This may be related to prior surgery and/or radiation. 2. No new or progressive findings to suggest recurrent disease. Electronically Signed   By: Misty Stanley M.D.   On: 01/17/2016 16:58      ASSESSMENT & PLAN:   70 yo with multiple medical co-morbids  1) Intrahepatic invasive cholangiocarcinoma T2a N1 M0 (Stage IVA) with focal positive parenchymal margins and 1/2 LN positive. CT C/A/P on 04/03/2016 showed no evidence of cholangiocarcinoma local recurrence or metastases at this time. Patient  has completed her adjuvant concurrent chemo-radiation  on 05/25/2015 . She did overall quite  well with treatment without any overt prohibitive toxicities. 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  Required treatment with high-dose steroids and dietary modifications.  Symptoms progressive to have resolved.  Currently on PPI for overt symptoms. CT chest abdomen pelvis in March 2017 showed no evidence of disease progression. Patient was seen at [facility] by Dr. Dennison Nancy and was given the option of observation versus adjuvant gemcitabine. She had a followup at [facility] with a CT Chest abdomen pelvis on 10/16/2015 which showed no evidence of recurrent or metastatic disease. Patient has completed 4 months of Adjuvant gemcitabine as per recommendation from [place]. Rpt CT C/A/P  On 01/17/2016 -- shows no evidence of disease recurrence/progression. 2) grade 1 nausea with gemcitabine 3)grade 1-2 fatigue.  Plan -No clinical evidence of disease progression at this time -She was encouraged to maintain good physical activity level and optimize oral intake. -rtc with with Dr Irene Limbo with labs in 3 months for rpt H&P -will plan to rpt CT C/A/P in 6 months unless any new symptoms arise.  2) Mild thrombocytopenia-likely due to chemotherapy -resolved   3)h/o Grade 2 radiation gastritis with functional gastrointestinal obstruction that previously caused significant nausea or vomiting.  Resolved.  4) Acute Bronchitis /Reactive airway disease . No fevers/chills.--This is now resolved -counseled on smoking cessation.  5)Chemotherapy related nausea and poor appetite. Patient is responding well to Marinol. Now resolved and off chemotherapy -patient may discontinue use of Marinol at this time.  RTC with Dr Irene Limbo in 3 months with labs    Sullivan Lone MD [place] AAHIVMS [name] Hematology/Oncology Physician [facility]  (Office):       [phone] (Work cell):  [phone] (Fax):           [phone]

## 2016-02-13 ENCOUNTER — Telehealth: Payer: Self-pay | Admitting: General Practice

## 2016-02-13 NOTE — Telephone Encounter (Signed)
Spoke with pt confirmed 04/2016 appointment.

## 2016-02-27 ENCOUNTER — Ambulatory Visit: Payer: Medicare HMO | Admitting: Student

## 2016-04-17 ENCOUNTER — Encounter: Payer: Self-pay | Admitting: Student

## 2016-04-17 ENCOUNTER — Ambulatory Visit (INDEPENDENT_AMBULATORY_CARE_PROVIDER_SITE_OTHER): Payer: Medicare HMO | Admitting: Student

## 2016-04-17 VITALS — BP 136/60 | HR 55 | Temp 98.0°F | Wt 154.0 lb

## 2016-04-17 DIAGNOSIS — J449 Chronic obstructive pulmonary disease, unspecified: Secondary | ICD-10-CM | POA: Diagnosis not present

## 2016-04-17 DIAGNOSIS — Z23 Encounter for immunization: Secondary | ICD-10-CM | POA: Diagnosis not present

## 2016-04-17 DIAGNOSIS — I10 Essential (primary) hypertension: Secondary | ICD-10-CM

## 2016-04-17 DIAGNOSIS — R7303 Prediabetes: Secondary | ICD-10-CM

## 2016-04-17 DIAGNOSIS — M19012 Primary osteoarthritis, left shoulder: Secondary | ICD-10-CM

## 2016-04-17 DIAGNOSIS — M19011 Primary osteoarthritis, right shoulder: Secondary | ICD-10-CM | POA: Diagnosis not present

## 2016-04-17 DIAGNOSIS — C221 Intrahepatic bile duct carcinoma: Secondary | ICD-10-CM

## 2016-04-17 MED ORDER — FLUTICASONE PROPIONATE 50 MCG/ACT NA SUSP: 2.0000 | Freq: Every day | NASAL | 2 refills | Status: AC | PRN

## 2016-04-17 MED ORDER — FEXOFENADINE HCL 30 MG PO TABS: 30.0000 mg | ORAL_TABLET | Freq: Two times a day (BID) | ORAL | 2 refills | Status: DC

## 2016-04-17 NOTE — Patient Instructions (Addendum)
Follow up in 3 weeks for A1c and uric acid If you have any questions or concerns, call the office at [phone]

## 2016-04-17 NOTE — Progress Notes (Signed)
   Subjective:    Patient ID: Bonnie Peterson, female    DOB: 24-Mar-1946, 71 y.o.   MRN: [id number]   CC: Follow up, med review  HPI: 71 y/o F with PMH of intrahepatic chonangiocarcinoma, prediabetes, COPD and bilateral shoulder arthritis presents for follow up   HTN -  Previously on HCTZ buit this was stopped by an outside hospital several months ago - she is not sure why but thinks it may be due top her liver - she denies headache, SOB, chest pain  Shouder pain - seen by orthpedic in the past and has had arthoscopy and steroid injection - she shoulder have recently started hurting her again and she requests referral to orthopedics  COPD - denies recent COPD exacerbation or SOB and reports compliance with her COPD regimen but she has been seen by pulmonology in the past and desires again to see them  Intrahepatic Cholangiocarcinoma - managed at [facility] long and has an appointment on 1/12 - has good appetite, no N/V  Prediabetes - was to get A1c today but decines as she anticipates blood drat at her oncology appointment on 1/12 and does not wish to have her blood taken today  Smoking status reviewed Non smoker  Review of Systems  Per HPI, else denies recent illness, fever, headache, changes in vision, chest pain, shortness of breath, abdominal pain, N/V/D    Objective:  BP 136/60   Pulse (!) 55   Temp 98 F (36.7 C) (Oral)   Wt 154 lb (69.9 kg)   SpO2 99%   BMI 27.28 kg/m  Vitals and nursing note reviewed  General: NAD Cardiac: RRR Respiratory: CTAB, normal effort Extremities: no edema or cyanosis. WWP. Skin: warm and dry, no rashes noted Neuro: alert and oriented, no focal deficits   Assessment & Plan:    COPD (chronic obstructive pulmonary disease) (HCC) Stable today, referal pulmonology per patient request  HYPERTENSION, BENIGN SYSTEMIC Stable off HCTZ, will continue to follow. Spoke with pharmacy regarding concern for hepatic contraindication for HCTZ  and she has no reason from a hepatic stand point to not be on it,. She does not have liver failure. Will restart HCTZ if it becomes necessary  Prediabetes Will plan to recheck A1c on follow up. Currently of DM medications - health diet and exercise discussed  Intrahepatic cholangiocarcinoma ([place]) Continues to be followed by oncology, will follow along  OSTEOARTHRITIS, [place] to have bilateral shoulder pain, previously managed by orthopedics,. Will refer again for evaluation    Bonnie Ammon A. Lincoln Brigham MD, [place] [facility] Resident PGY-3 Pager [phone]

## 2016-04-18 NOTE — Assessment & Plan Note (Signed)
Continues to have bilateral shoulder pain, previously managed by orthopedics,. Will refer again for evaluation

## 2016-04-18 NOTE — Assessment & Plan Note (Signed)
Will plan to recheck A1c on follow up. Currently of DM medications - health diet and exercise discussed

## 2016-04-18 NOTE — Assessment & Plan Note (Signed)
Stable today, referal pulmonology per patient request

## 2016-04-18 NOTE — Assessment & Plan Note (Signed)
Stable off HCTZ, will continue to follow. Spoke with pharmacy regarding concern for hepatic contraindication for HCTZ and she has no reason from a hepatic stand point to not be on it,. She does not have liver failure. Will restart HCTZ if it becomes necessary

## 2016-04-18 NOTE — Assessment & Plan Note (Signed)
Continues to be followed by oncology, will follow along

## 2016-04-19 ENCOUNTER — Telehealth: Payer: Self-pay | Admitting: Student

## 2016-04-19 ENCOUNTER — Ambulatory Visit: Payer: Medicare HMO | Admitting: Hematology

## 2016-04-19 ENCOUNTER — Other Ambulatory Visit: Payer: Medicare HMO

## 2016-04-19 ENCOUNTER — Telehealth: Payer: Self-pay | Admitting: Hematology

## 2016-04-19 NOTE — Telephone Encounter (Signed)
Patient has been scheduled to see Dr. Alfonso Ramus on 04/22/16 at  9:15 AM . Attempted to call patient and advise her of the appt. Her phone does not have VM and no answer. Will attempt to call again later today.

## 2016-04-19 NOTE — Telephone Encounter (Signed)
1/12 Appointments rescheduled per patient request. The patient was having trouble with transportation and needed to reschedule.

## 2016-04-19 NOTE — Telephone Encounter (Signed)
Called patient again. Advised her of the appt. She states she will call Raliegh Ip to reschedule for another day.

## 2016-04-30 ENCOUNTER — Ambulatory Visit (HOSPITAL_BASED_OUTPATIENT_CLINIC_OR_DEPARTMENT_OTHER): Payer: Medicare HMO | Admitting: Hematology

## 2016-04-30 ENCOUNTER — Encounter: Payer: Self-pay | Admitting: Hematology

## 2016-04-30 ENCOUNTER — Ambulatory Visit (HOSPITAL_BASED_OUTPATIENT_CLINIC_OR_DEPARTMENT_OTHER): Payer: Medicare HMO

## 2016-04-30 ENCOUNTER — Other Ambulatory Visit (HOSPITAL_BASED_OUTPATIENT_CLINIC_OR_DEPARTMENT_OTHER): Payer: Medicare HMO

## 2016-04-30 VITALS — BP 134/64 | HR 64 | Temp 98.0°F | Resp 18 | Ht 63.0 in | Wt 149.2 lb

## 2016-04-30 DIAGNOSIS — F419 Anxiety disorder, unspecified: Secondary | ICD-10-CM | POA: Diagnosis not present

## 2016-04-30 DIAGNOSIS — D696 Thrombocytopenia, unspecified: Secondary | ICD-10-CM | POA: Diagnosis not present

## 2016-04-30 DIAGNOSIS — Z08 Encounter for follow-up examination after completed treatment for malignant neoplasm: Secondary | ICD-10-CM

## 2016-04-30 DIAGNOSIS — C221 Intrahepatic bile duct carcinoma: Secondary | ICD-10-CM

## 2016-04-30 DIAGNOSIS — Z95828 Presence of other vascular implants and grafts: Secondary | ICD-10-CM

## 2016-04-30 LAB — CBC & DIFF AND RETIC
BASO%: 0.2 % (ref 0.0–2.0)
Basophils Absolute: 0 10*3/uL (ref 0.0–0.1)
EOS%: 0.3 % (ref 0.0–7.0)
Eosinophils Absolute: 0 10*3/uL (ref 0.0–0.5)
HCT: 36.5 % (ref 34.8–46.6)
HGB: 11.7 g/dL (ref 11.6–15.9)
Immature Retic Fract: 2 % (ref 1.60–10.00)
LYMPH%: 31.8 % (ref 14.0–49.7)
MCH: 28.1 pg (ref 25.1–34.0)
MCHC: 32.1 g/dL (ref 31.5–36.0)
MCV: 87.5 fL (ref 79.5–101.0)
MONO#: 0.5 10*3/uL (ref 0.1–0.9)
MONO%: 8.2 % (ref 0.0–14.0)
NEUT#: 3.6 10*3/uL (ref 1.5–6.5)
NEUT%: 59.5 % (ref 38.4–76.8)
Platelets: 126 10*3/uL — ABNORMAL LOW (ref 145–400)
RBC: 4.17 10*6/uL (ref 3.70–5.45)
RDW: 15 % — ABNORMAL HIGH (ref 11.2–14.5)
Retic %: 1.11 % (ref 0.70–2.10)
Retic Ct Abs: 46.29 10*3/uL (ref 33.70–90.70)
WBC: 6 10*3/uL (ref 3.9–10.3)
lymph#: 1.9 10*3/uL (ref 0.9–3.3)

## 2016-04-30 LAB — COMPREHENSIVE METABOLIC PANEL
ALT: 11 U/L (ref 0–55)
AST: 18 U/L (ref 5–34)
Albumin: 3.4 g/dL — ABNORMAL LOW (ref 3.5–5.0)
Alkaline Phosphatase: 113 U/L (ref 40–150)
Anion Gap: 7 mEq/L (ref 3–11)
BUN: 16.5 mg/dL (ref 7.0–26.0)
CO2: 26 mEq/L (ref 22–29)
Calcium: 9.7 mg/dL (ref 8.4–10.4)
Chloride: 107 mEq/L (ref 98–109)
Creatinine: 0.7 mg/dL (ref 0.6–1.1)
EGFR: >90 mL/min/{1.73_m2} (ref >90)
Glucose: 131 mg/dl (ref 70–140)
Potassium: 3.7 mEq/L (ref 3.5–5.1)
Sodium: 141 mEq/L (ref 136–145)
Total Bilirubin: 0.57 mg/dL (ref 0.20–1.20)
Total Protein: 6.8 g/dL (ref 6.4–8.3)

## 2016-04-30 LAB — CEA (IN HOUSE-CHCC): CEA (CHCC-In House): 1.63 ng/mL (ref 0.00–5.00)

## 2016-04-30 MED ORDER — HEPARIN SOD (PORK) LOCK FLUSH 100 UNIT/ML IV SOLN
500.0000 [IU] | Freq: Once | INTRAVENOUS | Status: AC | PRN
  Administered 2016-04-30: 500 [IU] via INTRAVENOUS
  Filled 2016-04-30: qty 5

## 2016-04-30 MED ORDER — LORAZEPAM 0.5 MG PO TABS: 0.5000 mg | ORAL_TABLET | Freq: Three times a day (TID) | ORAL | 0 refills | Status: DC | PRN

## 2016-04-30 MED ORDER — SODIUM CHLORIDE 0.9 % IJ SOLN
10.0000 mL | INTRAMUSCULAR | Status: DC | PRN
  Administered 2016-04-30: 10 mL via INTRAVENOUS
  Filled 2016-04-30: qty 10

## 2016-04-30 MED FILL — LORazepam 0.5 MG TABS: 0.5 | 10 days supply | Qty: 30 | Fill #0

## 2016-05-01 LAB — CANCER ANTIGEN 19-9: CA 19-9: 10 U/mL (ref 0–35)

## 2016-05-02 NOTE — Progress Notes (Signed)
[name]    HEMATOLOGY/ONCOLOGY CLINIC NOTE  Date of Service: 04/30/2016    Patient Care Team: Veatrice Bourbon, MD as PCP - General (Family Medicine) Aquilla Hacker, MD (Inactive) as Resident (Family Medicine)  CHIEF COMPLAINTS/PURPOSE OF CONSULTATION:   F/u for Cholangiocarcinoma s/p completion of concurrent chemo-Radiation.  DIAGNOSIS  Stage IVA (T2a, pN1, M0) Intrahepatic Cholangiocarcinoma  Current Treatment  Active Surveillance  Previous treatment 02/09/15 L lobe of liver partial hepatectomy segments 2, 3, 4a, 4b invasive cholangiocarcinoma, moderately differentiated. Tumor focally extends to the parenchymal margin of resection. Uninvolved liver parenchyma with steatohepatitis and associated periportal and centrilobular pericellular fibrosis. Scattered von Meyenburg complexes. One hepatic artery LN with metastatic cholangiocarcinoma. Fibroadipose tissue and nerve negative for malignancy. Gallbladder negative for malignancy. One LN negative for malignancy. T2aN1 invasive cholangiocarcinoma, moderately differentiated, with positive hepatic parenchymal margin, and 1/2 nodes positive for tumor involvement.  -Concurrent Capecitabine 835m/m2 po BID M-F while on concurrent RT Final date of RT completed 05/25/2015 Patient  had developed grade 2-3 radiation gastritis with some functional gastric outlet obstruction which has now resolved.   Completed adjuvant gemcitabine x 4 months  Interval History  Ms. MSculleyis here for follow her scheduled 3 month followup for her intrahepatic cholangiocarcinoma. Patient notes no new abdominal pain.  No other acute new focal symptoms. Patient notes that she is eating better. She notes sadness/grief and anxiety from the recent loss of her daughter and requests something for anxiety -- given Ativan. She is accompanied by her other daughter. No other acute new concerns.   MEDICAL HISTORY:  Past Medical History:  Diagnosis Date  . Anxiety   .  Benign positional vertigo   . [place]   intrahepatic cholangiocarcinoma  . COPD (chronic obstructive pulmonary disease) ([place]   . Depression   . Diabetes mellitus without complication ([place]   . Early cataracts, bilateral 10/13   Optho, Dr SGershon Crane . Echocardiogram findings abnormal, without diagnosis 10/10   10/10: mild pulm HTN, EF 60-65%, mild LVH, moderate aortic regurg  . GERD (gastroesophageal reflux disease)    I have "acid reflux"  . Gout   . HLD (hyperlipidemia)   . HTN, goal below 130/80   . Irritable bowel syndrome   . Obesity, Class III, BMI 40-49.9 (morbid obesity) ([place]   . Obstructive sleep apnea    wears cpap  . Osteoarthritis (arthritis due to wear and tear of joints)    also gout  . Restrictive lung disease    SURGICAL HISTORY: Past Surgical History:  Procedure Laterality Date  . ABDOMINAL HYSTERECTOMY    . ESOPHAGOGASTRODUODENOSCOPY N/A 06/15/2015   Procedure: ESOPHAGOGASTRODUODENOSCOPY (EGD);  Surgeon: CGatha Mayer MD;  [facility];  Service: Endoscopy;  Laterality: N/A;  . LIVER BIOPSY    . OPEN PARTIAL HEPATECTOMY  Left 02/09/15  . PARTIAL HYSTERECTOMY    . ROTATOR CUFF REPAIR       L rotator cuff repair 11/07-[place] - 12/7/200  . TONSILLECTOMY      SOCIAL HISTORY: Social History   Social History  . Marital status: Single    Spouse name: N/A  . Number of children: 4  . Years of education: N/A   Occupational History  . unemployed    Social History Main Topics  . Smoking status: Current Some Day Smoker    Packs/day: 0.50    Years: 40.00    Types: Cigarettes  . Smokeless tobacco: Never Used  . Alcohol use 0.0 oz/week  Comment: occasionally  . Drug use: Yes    Types: Marijuana  . Sexual activity: No   Other Topics Concern  . 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 Breast cancer Mother   . Hypertension Mother   . Coronary artery disease Mother   . Diabetes type II Mother   . Diabetes type II Sister   . Pancreatic cancer Sister     ALLERGIES:  is allergic to other.  MEDICATIONS:  Current Outpatient Prescriptions  Medication Sig Dispense Refill  . allopurinol (ZYLOPRIM) 100 MG tablet Take 1 tablet (100 mg total) by mouth daily. 30 tablet 1  . aspirin EC 81 MG tablet Take 1 tablet (81 mg total) by mouth daily. 90 tablet 2  . cholecalciferol (VITAMIN D) 1000 UNITS tablet Take 1 tablet (1,000 Units total) by mouth daily. 30 tablet 11  . diclofenac sodium (VOLTAREN) 1 % GEL Apply 2 g topically 4 (four) times daily as needed. (Patient taking differently: Apply 2 g topically 4 (four) times daily as needed (knee pain, elbow and hands). ) 100 g 4  . fexofenadine (ALLEGRA) 30 MG tablet Take 1 tablet (30 mg total) by mouth 2 (two) times daily. 30 tablet 2  . fish oil-omega-3 fatty acids 1000 MG capsule Take 1 g by mouth every 30 (thirty) days.     . fluticasone (FLONASE) 50 MCG/ACT nasal spray Place 2 sprays into both nostrils daily as needed for rhinitis. 16 g 2  . Hyprom-Naphaz-Polysorb-Zn Sulf (CLEAR EYES COMPLETE OP) Place 1 drop into both eyes daily as needed (dry eyes).     . Lidocaine-Prilocaine, Bulk, 2.5-2.5 % CREA Apply 5 mLs topically as needed (for port-a-cath use). 1 hour before use, cover with Band aid or plastic wrap to seal (Patient not taking: Reported on 04/17/2016) 30 g prn  . loratadine (CLARITIN) 10 MG tablet Take 1 tablet (10 mg total) by mouth daily as needed for allergies. (Patient not taking: Reported on 04/17/2016) 30 tablet 3  . LORazepam (ATIVAN) 0.5 MG tablet Take 1 tablet (0.5 mg total) by mouth every 8  (eight) hours as needed for anxiety. 30 tablet 0  . Multiple Vitamin (MULTIVITAMIN WITH MINERALS) TABS tablet Take 1 tablet by mouth daily.    . ondansetron (ZOFRAN) 8 MG tablet Take by mouth every 8 (eight) hours as needed for nausea or vomiting.    . pantoprazole (PROTONIX) 40 MG tablet Take 40 mg by mouth daily.    [name] PROAIR HFA 108 (90 Base) MCG/ACT inhaler INHALE 2 PUFFS INTO THE LUNGS EVERY 4 HOURS AS NEEDED FOR WHEEZING OR SHORTNESS OF BREATH (Patient not taking: Reported on 04/17/2016) 1 Inhaler 0  . tiotropium (SPIRIVA) 18 MCG inhalation capsule Place 1 capsule (18 mcg total) into inhaler and inhale as needed (if its hot outside). 30 capsule 5  . traMADol (ULTRAM) 50 MG tablet Take 1 tablet (50 mg total) by mouth every 6 (six) hours as needed for moderate pain or severe pain. 60 tablet 0  . VITAMIN E PO Take 1 tablet by  mouth daily.      No current facility-administered medications for this visit.     REVIEW OF SYSTEMS:    10 Point review of Systems was done is negative except as noted above.  PHYSICAL EXAMINATION: ECOG PERFORMANCE STATUS: 1 - Symptomatic but completely ambulatory Vital signs reviewed in Epic Afebrile temperature 98.6, breathing at 18, pulse 66/m, blood pressure 155/58.  GENERAL:alert, in no acute distress and comfortable SKIN: skin color, texture, turgor are normal, no rashes or significant lesions EYES: normal, conjunctiva are pink and non-injected, sclera clear OROPHARYNX: Mucous membranes moist, oral thrush resolved. NECK: supple, no JVD, thyroid normal size, non-tender, without nodularity LYMPH:  no palpable lymphadenopathy in the cervical, axillary or inguinal LUNGS: clear to auscultation with normal respiratory effort HEART: regular rate & rhythm,  no murmurs and no lower extremity edema ABDOMEN: abdomen Soft with normoactive bowel sounds, No guarding rigidity or rebound. Musculoskeletal: no cyanosis of digits and no clubbing  PSYCH: alert & oriented x 3  with fluent speech NEURO: no focal motor/sensory deficits  LABORATORY DATA:  . CBC Latest Ref Rng & Units 04/30/2016 01/19/2016 12/08/2015  WBC 3.9 - 10.3 10e3/uL 6.0 6.7 2.5(L)  Hemoglobin 11.6 - 15.9 g/dL 11.7 11.7 9.0(L)  Hematocrit 34.8 - 46.6 % 36.5 36.8 27.6(L)  Platelets 145 - 400 10e3/uL 126(L) 169 264   . CBC    Component Value Date/Time   WBC 6.0 04/30/2016 0923   WBC 6.3 09/07/2015 0850   RBC 4.17 04/30/2016 0923   RBC 4.28 09/07/2015 0850   HGB 11.7 04/30/2016 0923   HCT 36.5 04/30/2016 0923   PLT 126 (L) 04/30/2016 0923   MCV 87.5 04/30/2016 0923   MCH 28.1 04/30/2016 0923   MCH 28.7 09/07/2015 0850   MCHC 32.1 04/30/2016 0923   MCHC 31.5 09/07/2015 0850   RDW 15.0 (H) 04/30/2016 0923   LYMPHSABS 1.9 04/30/2016 0923   MONOABS 0.5 04/30/2016 0923   EOSABS 0.0 04/30/2016 0923   BASOSABS 0.0 04/30/2016 0923   . CMP Latest Ref Rng & Units 04/30/2016 01/19/2016 12/08/2015  Glucose 70 - 140 mg/dl 131 100 128  BUN 7.0 - 26.0 mg/dL 16.5 8.5 8.1  Creatinine 0.6 - 1.1 mg/dL 0.7 0.7 0.7  Sodium 136 - 145 mEq/L 141 141 139  Potassium 3.5 - 5.1 mEq/L 3.7 4.9 3.3(L)  Chloride 101 - 111 mmol/L - - -  CO2 22 - 29 mEq/L 26 27 25   Calcium 8.4 - 10.4 mg/dL 9.7 9.8 9.4  Total Protein 6.4 - 8.3 g/dL 6.8 7.4 7.0  Total Bilirubin 0.20 - 1.20 mg/dL 0.57 <0.22 <0.30  Alkaline Phos 40 - 150 U/L 113 105 84  AST 5 - 34 U/L 18 13 23   ALT 0 - 55 U/L 11 6 16    Component     Latest Ref Rng & Units 04/30/2016  CA 19-9     0 - 35 U/mL 10  CEA ([facility])     0.00 - 5.00 ng/mL 1.63    RADIOGRAPHIC STUDIES: I have personally reviewed the radiological images as listed and agreed with the findings in the report.  .No results found.    ASSESSMENT & PLAN:   71 yo with multiple medical co-morbids  1) Intrahepatic invasive cholangiocarcinoma T2a N1 M0 (Stage IVA) with focal positive parenchymal margins and 1/2 LN positive. CT C/A/P on 04/03/2016 showed no evidence of  cholangiocarcinoma local recurrence or metastases at this time. Patient  has completed her adjuvant concurrent chemo-radiation  on 05/25/2015 .  She did overall quite well with treatment without any overt prohibitive toxicities. 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  Required treatment with high-dose steroids and dietary modifications.  Symptoms progressive to have resolved.  Currently on PPI for overt symptoms. CT chest abdomen pelvis in March 2017 showed no evidence of disease progression. Patient was [facility] by Dr. Dennison Nancy and was given the option of observation versus adjuvant gemcitabine. She had a followup at [facility] with a CT Chest abdomen pelvis on 10/16/2015 which showed no evidence of recurrent or metastatic disease. Patient has completed 4 months of Adjuvant gemcitabine as per recommendation from [facility]. Rpt CT C/A/P  On 01/17/2016 -- shows no evidence of disease recurrence/progression.  Plan -No clinical evidence of disease progression at this time and her CEA/CA 19-9 levels are giving normal limits. -no indication for additional treatment of her cholangiocarcinoma at this time. -She was encouraged to maintain good physical activity level and optimize oral intake. -rtc with with Dr Irene Limbo with labs in 3 months for rpt H&P with CT chest/abd/pelvis  2) Mild thrombocytopenia-will monitor.  3) Grief and anxiety due to the recent loss of her daughter. Plan -provided supportive counseling -given ativan prn for her anxiety.  RTC with Dr Irene Limbo in 3 months with labs and [place] MD [place] AAHIVMS [facility] Hematology/Oncology Physician [facility]  (Office):       [phone] (Work cell):  [phone] (Fax):           [phone]

## 2016-05-10 ENCOUNTER — Telehealth: Payer: Self-pay | Admitting: Hematology

## 2016-05-10 NOTE — Telephone Encounter (Signed)
Attempted to call and confirm 3/6 and 4/24 appointments with patient

## 2016-05-13 ENCOUNTER — Encounter: Payer: Self-pay | Admitting: Internal Medicine

## 2016-05-13 ENCOUNTER — Ambulatory Visit (INDEPENDENT_AMBULATORY_CARE_PROVIDER_SITE_OTHER): Payer: Medicare HMO | Admitting: Internal Medicine

## 2016-05-13 VITALS — BP 144/60 | HR 73 | Ht 63.0 in | Wt 147.8 lb

## 2016-05-13 DIAGNOSIS — F17210 Nicotine dependence, cigarettes, uncomplicated: Secondary | ICD-10-CM | POA: Diagnosis not present

## 2016-05-13 DIAGNOSIS — G4733 Obstructive sleep apnea (adult) (pediatric): Secondary | ICD-10-CM

## 2016-05-13 DIAGNOSIS — J449 Chronic obstructive pulmonary disease, unspecified: Secondary | ICD-10-CM | POA: Diagnosis not present

## 2016-05-13 NOTE — Assessment & Plan Note (Addendum)
Spirometry 05/13/2016  FEV1 1.03 (58%)  Ratio 70 mild curvature on no RX > no maint needed   As I explained to this patient in detail:  although there may be copd present, it is very mild and may not be clinically relevant:   it does not appear to be limiting activity tolerance any more than a set of worn tires limits someone from driving a car  around a parking lot.     That is to say:  I don't recommend aggressive pulmonary rx at this point unless limiting symptoms arise or acute exacerbations become as issue, neither of which is the case now.  I asked the patient to contact this office at any time in the future should either of these problems arise.    The key is to stop smoking and in meantime just use saba prn   05/13/2016  After extensive coaching HFA effectiveness =    75%   Total time devoted to counseling  > 50 % of initial 60 min office visit:  review case with pt/ discussion of options/alternatives/ personally creating written customized instructions  in presence of pt  then going over those specific  Instructions directly with the pt including how to use all of the meds but in particular covering each new medication in detail and the difference between the maintenance= "automatic" meds and the prns using an action plan format for the latter (If this problem/symptom => do that organization reading Left to right).  Please see AVS from this visit for a full list of these instructions which I personally wrote for this pt and  are unique to this visit.

## 2016-05-13 NOTE — Patient Instructions (Signed)
Only use your albuterol as a rescue medication to be used if you can't catch your breath by resting or doing a relaxed purse lip breathing pattern.  - The less you use it, the better it will work when you need it. - Ok to use up to 2 puffs  every 4 hours if you must but call for immediate appointment if use goes up over your usual need - Don't leave home without it !!  (think of it like the spare tire for your car)    If you are bothered by excessive drowsiness then call for a split night sleep study    If you are satisfied with your treatment plan,  let your doctor know and he/she can either refill your medications or you can return here when your prescription runs out.     If in any way you are not 100% satisfied,  please tell us.  If 100% better, tell your friends!  Pulmonary follow up is as needed

## 2016-05-13 NOTE — Progress Notes (Signed)
Subjective:     Patient ID: Bonnie Peterson, female   DOB: [birth year],    MRN: [id number]  HPI  50 yobf active smoker with h/o dm/osa/hbp much better since lost wt with liver Ca rx at [facility] and no longer on cpap since around Jan 2017 and referred to pulmonary clinic 05/13/2016 by Dr   Lincoln Brigham for eval of osa and ? Copd     05/13/2016 1st [facility] office visit/ Chao Blazejewski   Chief Complaint  Patient presents with  . Pulmonary Consult    Referred by Dr. Mallie Mussel for eval of COPD. Pt c/o cough for the past several days- prod with clear sputum. She states also c/o runny nose.    no activity where she feels the breathing stops or limits here in any way though she is quite sedentary  Main problem is falling asleep in pms watching tv/ doesn't drive anymore  Not limited by breathing from desired activities   Despite ongoing smoking not having any sign aecopds off   Some am cough /congestion clear x half a tsp in a few min and does not disturb sleep Has tried spiriva no better, rarely uses saba at all  No obvious day to day or daytime variability or assoc   purulent sputum or mucus plugs or hemoptysis or cp or chest tightness, subjective wheeze or overt   hb symptoms. No unusual exp hx or h/o childhood pna/ asthma or knowledge of premature birth.  Sleeping ok without nocturnal  or early am exacerbation  of respiratory  c/o's or need for noct saba. Also denies any obvious fluctuation of symptoms with weather or environmental changes or other aggravating or alleviating factors except as outlined above   Current Medications, Allergies, Complete Past Medical History, Past Surgical History, Family History, and Social History were reviewed in [employer] record.  ROS  The following are not active complaints unless bolded sore throat, dysphagia, dental problems, itching, sneezing,  nasal congestion or excess/ purulent secretions, ear ache,   fever, chills, sweats, unintended wt loss,  classically pleuritic or exertional cp,  orthopnea pnd or leg swelling, presyncope, palpitations, abdominal pain, anorexia, nausea, vomiting, diarrhea  or change in bowel or bladder habits, change in stools or urine, dysuria,hematuria,  rash, arthralgias, visual complaints, headache, numbness, weakness or ataxia or problems with walking or coordination,  change in mood/affect or memory.              Review of Systems     Objective:   Physical Exam amb bf nad   Wt Readings from Last 3 Encounters:  05/13/16 147 lb 12.8 oz (67 kg)  04/30/16 149 lb 3.2 oz (67.7 kg)  04/17/16 154 lb (69.9 kg)    Vital signs reviewed  - Note on arrival 02 sats  97% on RA      HEENT: nl dentition, turbinates, and oropharynx. Nl external ear canals without cough reflex - Modified Mallampati Score =   1   NECK :  without JVD/Nodes/TM/ nl carotid upstrokes bilaterally   LUNGS: no acc muscle use,  Nl contour chest which is clear to A and P bilaterally without cough on insp or exp maneuvers   CV:  RRR  no s3 or murmur or increase in P2, nad no edema   ABD:  soft and nontender with nl inspiratory excursion in the supine position. No bruits or organomegaly appreciated, bowel sounds nl  MS:  Nl gait/ ext warm without deformities, calf tenderness, cyanosis  or clubbing No obvious joint restrictions   SKIN: warm and dry without lesions    NEURO:  alert, approp, nl sensorium with  no motor or cerebellar deficits apparent.      I personally reviewed images and agree with radiology impression as follows:  CT with contrast Chest  01/17/16   Cardiovascular: Heart size is enlarged. Coronary artery calcification is noted. Atherosclerotic calcification is noted in the wall of the thoracic aorta. Right Port-A-Cath tip is positioned at the SVC/ RA junction. No pericardial effusion.  Mediastinum/Nodes: No mediastinal lymphadenopathy. There is no hilar lymphadenopathy. The esophagus has normal imaging  features. There is no axillary lymphadenopathy.  Lungs/Pleura: Emphysema as before. 7 x 7 mm posterior right lower lobe pulmonary nodule is now 7 x 6 mm (image 70 series 4). Linear scarring right middle lobe is unchanged. Atelectasis noted in the lingula. No edema or pleural effusion. No focal airspace consolidation.        Assessment:

## 2016-05-14 NOTE — Assessment & Plan Note (Signed)
NPSG 2010:  AHI 15/hr with desat to 75% Pt d/c'd cpap 04/2015 with wt loss > [place] 05/13/2016 = 6 off cpap  Presently she only has mild daytime drowsiness > will need split night study if desires to restart as has lot a lot of wt since the previous one in 2010   Discussed in detail all the  indications, usual  risks and alternatives  relative to the benefits with patient who agrees to proceed with conservative f/u as outlined

## 2016-05-14 NOTE — Assessment & Plan Note (Signed)

## 2016-06-11 ENCOUNTER — Ambulatory Visit (HOSPITAL_BASED_OUTPATIENT_CLINIC_OR_DEPARTMENT_OTHER): Payer: Medicare HMO

## 2016-06-11 VITALS — BP 168/83 | HR 70 | Temp 98.5°F | Resp 20

## 2016-06-11 DIAGNOSIS — C221 Intrahepatic bile duct carcinoma: Secondary | ICD-10-CM | POA: Diagnosis not present

## 2016-06-11 DIAGNOSIS — Z452 Encounter for adjustment and management of vascular access device: Secondary | ICD-10-CM | POA: Diagnosis not present

## 2016-06-11 DIAGNOSIS — Z95828 Presence of other vascular implants and grafts: Secondary | ICD-10-CM

## 2016-06-11 MED ORDER — HEPARIN SOD (PORK) LOCK FLUSH 100 UNIT/ML IV SOLN
500.0000 [IU] | Freq: Once | INTRAVENOUS | Status: AC | PRN
  Administered 2016-06-11: 500 [IU] via INTRAVENOUS
  Filled 2016-06-11: qty 5

## 2016-06-11 MED ORDER — SODIUM CHLORIDE 0.9 % IJ SOLN
10.0000 mL | INTRAMUSCULAR | Status: DC | PRN
  Administered 2016-06-11: 10 mL via INTRAVENOUS
  Filled 2016-06-11: qty 10

## 2016-06-11 NOTE — Patient Instructions (Signed)

## 2016-06-19 ENCOUNTER — Ambulatory Visit (INDEPENDENT_AMBULATORY_CARE_PROVIDER_SITE_OTHER): Payer: Medicare HMO | Admitting: Student

## 2016-06-19 ENCOUNTER — Encounter: Payer: Self-pay | Admitting: Student

## 2016-06-19 VITALS — BP 122/70 | HR 69 | Temp 98.7°F | Wt 154.0 lb

## 2016-06-19 DIAGNOSIS — H259 Unspecified age-related cataract: Secondary | ICD-10-CM

## 2016-06-19 DIAGNOSIS — I10 Essential (primary) hypertension: Secondary | ICD-10-CM

## 2016-06-19 LAB — COMPLETE METABOLIC PANEL WITH GFR
ALT: 10 U/L (ref 6–29)
AST: 15 U/L (ref 10–35)
Albumin: 3.5 g/dL — ABNORMAL LOW (ref 3.6–5.1)
Alkaline Phosphatase: 96 U/L (ref 33–130)
BUN: 22 mg/dL (ref 7–25)
CO2: 28 mmol/L (ref 20–31)
Calcium: 9.1 mg/dL (ref 8.6–10.4)
Chloride: 104 mmol/L (ref 98–110)
Creat: 0.66 mg/dL (ref 0.50–0.99)
GFR, Est African American: >89 mL/min (ref >=60)
GFR, Est Non African American: >89 mL/min (ref >=60)
Glucose, Bld: 79 mg/dL (ref 65–99)
Potassium: 4.4 mmol/L (ref 3.5–5.3)
Sodium: 139 mmol/L (ref 135–146)
Total Bilirubin: 0.3 mg/dL (ref 0.2–1.2)
Total Protein: 6.6 g/dL (ref 6.1–8.1)

## 2016-06-19 MED ORDER — AMLODIPINE BESYLATE 10 MG PO TABS: 10.0000 mg | ORAL_TABLET | Freq: Every day | ORAL | 3 refills | Status: DC

## 2016-06-19 MED ORDER — LISINOPRIL-HYDROCHLOROTHIAZIDE 10-12.5 MG PO TABS: 1.0000 | ORAL_TABLET | Freq: Every day | ORAL | 3 refills | Status: DC

## 2016-06-19 MED FILL — AMLODIPINE BESYLATE 10 MG T: 10 | 90 days supply | Qty: 90 | Fill #0

## 2016-06-19 MED FILL — LISINOPRIL-HCTZ 10-12.5 MG: 10-12.5 | 90 days supply | Qty: 90 | Fill #0

## 2016-06-19 NOTE — Progress Notes (Signed)
   Subjective:    Patient ID: Bonnie Peterson, female    DOB: October 24, 1945, 71 y.o.   MRN: [id number]   CC: med refill  HPI: 71 y/o F with PMH of HTN presents for med refill  HTN - has been taking atenolol, enalapril and norvasc although these are no longer on her med list - she still has some and took all of these this AM - she has not been taking HCTZ - she denies chest pain, SOB  Cataracts - has a history of cataracts and requests referral to ophtho for evaluation, has not seen then in several years  Smoking status reviewed  Review of Systems  Per HPI, else denies recent illness, fever,    Objective:  BP 122/70   Pulse 69   Temp 98.7 F (37.1 C) (Oral)   Wt 154 lb (69.9 kg)   SpO2 99%   BMI 27.28 kg/m  Vitals and nursing note reviewed  General: NAD Cardiac: RRR, Respiratory: CTAB, normal effort Skin: warm and dry, no rashes noted Neuro: alert and oriented, no focal deficits   Assessment & Plan:    HYPERTENSION, BENIGN SYSTEMIC Well controlled on stated atenolol, enalapril and norvasc. They are no longer in our system . Will start on Prinzide and norvasc - follow up in 1-2 weeks for BP check - CMP today   Reported Cataract - will refer to ophtho for concern for cataracts  Bonnie Peterson A. Lincoln Brigham MD, [facility] Resident PGY-3 Pager [phone]

## 2016-06-19 NOTE — Patient Instructions (Addendum)
Follow up ine one week for blood pressure check If you have questions or concerns, call the office at [phone]

## 2016-06-19 NOTE — Assessment & Plan Note (Signed)
Well controlled on stated atenolol, enalapril and norvasc. They are no longer in our system . Will start on Prinzide and norvasc - follow up in 1-2 weeks for BP check - CMP today

## 2016-06-25 ENCOUNTER — Other Ambulatory Visit: Payer: Self-pay | Admitting: *Deleted

## 2016-06-25 MED ORDER — LISINOPRIL-HYDROCHLOROTHIAZIDE 10-12.5 MG PO TABS: 1.0000 | ORAL_TABLET | Freq: Every day | ORAL | 3 refills | Status: DC

## 2016-06-25 MED ORDER — ALENDRONATE SODIUM 70 MG PO TABS: ORAL_TABLET | ORAL | 2 refills | Status: DC

## 2016-06-26 MED ORDER — LORAZEPAM 0.5 MG PO TABS: 0.5000 mg | ORAL_TABLET | Freq: Three times a day (TID) | ORAL | 1 refills | Status: DC | PRN

## 2016-06-26 NOTE — Telephone Encounter (Signed)
Ativan left at front. Please call patient to pick up

## 2016-06-26 NOTE — Telephone Encounter (Signed)
Pt contacted and informed of rx ready for pick up at [facility].

## 2016-06-27 MED FILL — ALLOPURINOL 100 MG TABLET: 100 | 30 days supply | Qty: 30 | Fill #1

## 2016-06-27 MED FILL — LORazepam 0.5 MG TABS: 0.5 | 10 days supply | Qty: 30 | Fill #0

## 2016-07-03 ENCOUNTER — Encounter: Payer: Self-pay | Admitting: Student

## 2016-07-03 ENCOUNTER — Ambulatory Visit (INDEPENDENT_AMBULATORY_CARE_PROVIDER_SITE_OTHER): Payer: Medicare HMO | Admitting: Student

## 2016-07-03 VITALS — BP 142/66 | HR 60 | Temp 98.6°F | Wt 156.0 lb

## 2016-07-03 DIAGNOSIS — I10 Essential (primary) hypertension: Secondary | ICD-10-CM

## 2016-07-03 MED ORDER — LISINOPRIL-HYDROCHLOROTHIAZIDE 20-25 MG PO TABS: 1.0000 | ORAL_TABLET | Freq: Every day | ORAL | 3 refills | Status: DC

## 2016-07-03 NOTE — Assessment & Plan Note (Addendum)
BP still elevated to 142/66 after repeat, initial 152/68 - given BP still above goal , will increase prinzide to 20-25, pateint to stop taking enalapril - repeat renal function testing today - patient to check BP at home and record to bring to next visit - follow up in 1-2 weeks for BP check

## 2016-07-03 NOTE — Patient Instructions (Signed)
Follow up in 2 weeks for BP check If you feel lightheaded or dizzy, check your blood pressure If you have questions or concerns, call the officer at [phone]

## 2016-07-03 NOTE — Progress Notes (Signed)
   Subjective:    Patient ID: Bonnie Peterson, female    DOB: [birth year], 71 y.o.   MRN: [id number]   CC: followup BP  HPI: 71 y/o F with PMH of HTN presents for BP follow up  HTN - has been taking enalapril, prinzide and norvasc - she does not check her home BP - she denies chest pain, SOB, headaches  Smoking status reviewed  Review of Systems  Per HPI, else denies recent illness, fever   Objective:  BP (!) 142/66   Pulse 60   Temp 98.6 F (37 C) (Oral)   Wt 156 lb (70.8 kg)   SpO2 97%   BMI 27.63 kg/m  Vitals and nursing note reviewed  General: NAD Cardiac: RRR,  Respiratory: CTAB, normal effort Extremities: no edema or cyanosis. WWP. Skin: warm and dry, no rashes noted Neuro: alert and oriented, no focal deficits   Assessment & Plan:    HYPERTENSION, BENIGN SYSTEMIC BP still elevated to 142/66 after repeat, initial 152/68 - given BP still above goal , will increase prinzide to 20-25, pateint to stop taking enalapril - repeat renal function testing today - patient to check BP at home and record to bring to next visit - follow up in 1-2 weeks for BP check    Emmajane Altamura A. Lincoln Brigham MD, [facility] Resident PGY-3 Pager [phone]

## 2016-07-04 LAB — COMPREHENSIVE METABOLIC PANEL
ALT: 11 IU/L (ref 0–32)
AST: 19 IU/L (ref 0–40)
Albumin/Globulin Ratio: 1.2 (ref 1.2–2.2)
Albumin: 4 g/dL (ref 3.6–4.8)
Alkaline Phosphatase: 115 IU/L (ref 39–117)
BUN/Creatinine Ratio: 24 (ref 12–28)
BUN: 17 mg/dL (ref 8–27)
Bilirubin Total: 0.2 mg/dL (ref 0.0–1.2)
CO2: 21 mmol/L (ref 18–29)
Calcium: 9.4 mg/dL (ref 8.7–10.3)
Chloride: 101 mmol/L (ref 96–106)
Creatinine, Ser: 0.72 mg/dL (ref 0.57–1.00)
GFR calc Af Amer: 99 mL/min/{1.73_m2} (ref >59)
GFR calc non Af Amer: 86 mL/min/{1.73_m2} (ref >59)
Globulin, Total: 3.3 g/dL (ref 1.5–4.5)
Glucose: 87 mg/dL (ref 65–99)
Potassium: 5.1 mmol/L (ref 3.5–5.2)
Sodium: 142 mmol/L (ref 134–144)
Total Protein: 7.3 g/dL (ref 6.0–8.5)

## 2016-07-23 ENCOUNTER — Ambulatory Visit: Payer: Medicare HMO | Admitting: Student

## 2016-07-29 ENCOUNTER — Ambulatory Visit: Payer: Medicare HMO | Admitting: Student

## 2016-07-30 ENCOUNTER — Encounter: Payer: Self-pay | Admitting: Hematology

## 2016-07-30 ENCOUNTER — Ambulatory Visit (HOSPITAL_BASED_OUTPATIENT_CLINIC_OR_DEPARTMENT_OTHER): Payer: Medicare HMO | Admitting: Hematology

## 2016-07-30 ENCOUNTER — Other Ambulatory Visit (HOSPITAL_BASED_OUTPATIENT_CLINIC_OR_DEPARTMENT_OTHER): Payer: Medicare HMO

## 2016-07-30 ENCOUNTER — Telehealth: Payer: Self-pay | Admitting: Hematology

## 2016-07-30 ENCOUNTER — Ambulatory Visit (HOSPITAL_BASED_OUTPATIENT_CLINIC_OR_DEPARTMENT_OTHER): Payer: Medicare HMO

## 2016-07-30 VITALS — BP 162/57 | HR 54 | Temp 98.0°F | Resp 16 | Wt 165.3 lb

## 2016-07-30 DIAGNOSIS — L738 Other specified follicular disorders: Secondary | ICD-10-CM

## 2016-07-30 DIAGNOSIS — C221 Intrahepatic bile duct carcinoma: Secondary | ICD-10-CM

## 2016-07-30 DIAGNOSIS — D696 Thrombocytopenia, unspecified: Secondary | ICD-10-CM

## 2016-07-30 DIAGNOSIS — Z95828 Presence of other vascular implants and grafts: Secondary | ICD-10-CM

## 2016-07-30 LAB — COMPREHENSIVE METABOLIC PANEL
ALT: 12 U/L (ref 0–55)
AST: 14 U/L (ref 5–34)
Albumin: 3.3 g/dL — ABNORMAL LOW (ref 3.5–5.0)
Alkaline Phosphatase: 119 U/L (ref 40–150)
Anion Gap: 8 mEq/L (ref 3–11)
BUN: 13.3 mg/dL (ref 7.0–26.0)
CO2: 26 mEq/L (ref 22–29)
Calcium: 9.2 mg/dL (ref 8.4–10.4)
Chloride: 105 mEq/L (ref 98–109)
Creatinine: 0.7 mg/dL (ref 0.6–1.1)
EGFR: >90 mL/min/{1.73_m2} (ref >90)
Glucose: 106 mg/dl (ref 70–140)
Potassium: 4 mEq/L (ref 3.5–5.1)
Sodium: 139 mEq/L (ref 136–145)
Total Bilirubin: 0.37 mg/dL (ref 0.20–1.20)
Total Protein: 6.9 g/dL (ref 6.4–8.3)

## 2016-07-30 LAB — CBC & DIFF AND RETIC
BASO%: 0.3 % (ref 0.0–2.0)
Basophils Absolute: 0 10*3/uL (ref 0.0–0.1)
EOS%: 0.9 % (ref 0.0–7.0)
Eosinophils Absolute: 0.1 10*3/uL (ref 0.0–0.5)
HCT: 36.4 % (ref 34.8–46.6)
HGB: 11.6 g/dL (ref 11.6–15.9)
Immature Retic Fract: 6 % (ref 1.60–10.00)
LYMPH%: 25 % (ref 14.0–49.7)
MCH: 28.4 pg (ref 25.1–34.0)
MCHC: 31.9 g/dL (ref 31.5–36.0)
MCV: 89.2 fL (ref 79.5–101.0)
MONO#: 0.8 10*3/uL (ref 0.1–0.9)
MONO%: 10.5 % (ref 0.0–14.0)
NEUT#: 4.9 10*3/uL (ref 1.5–6.5)
NEUT%: 63.3 % (ref 38.4–76.8)
Platelets: 141 10*3/uL — ABNORMAL LOW (ref 145–400)
RBC: 4.08 10*6/uL (ref 3.70–5.45)
RDW: 14.3 % (ref 11.2–14.5)
Retic %: 1.71 % (ref 0.70–2.10)
Retic Ct Abs: 69.77 10*3/uL (ref 33.70–90.70)
WBC: 7.7 10*3/uL (ref 3.9–10.3)
lymph#: 1.9 10*3/uL (ref 0.9–3.3)

## 2016-07-30 MED ORDER — SODIUM CHLORIDE 0.9 % IJ SOLN
10.0000 mL | INTRAMUSCULAR | Status: DC | PRN
  Administered 2016-07-30: 10 mL via INTRAVENOUS
  Filled 2016-07-30: qty 10

## 2016-07-30 MED ORDER — HEPARIN SOD (PORK) LOCK FLUSH 100 UNIT/ML IV SOLN
500.0000 [IU] | Freq: Once | INTRAVENOUS | Status: AC | PRN
  Administered 2016-07-30: 500 [IU] via INTRAVENOUS
  Filled 2016-07-30: qty 5

## 2016-07-30 MED ORDER — CLINDAMYCIN PHOSPHATE 1 % EX GEL: Freq: Two times a day (BID) | CUTANEOUS | 0 refills | Status: DC

## 2016-07-30 MED FILL — CLINDAMYCIN PH 1% GEL: 1 | 10 days supply | Qty: 30 | Fill #0

## 2016-07-30 NOTE — Telephone Encounter (Signed)
Gave patient avs report and appointments for June and July. If port is removed prior to flush appointments patient will call to cancel. Central radiology will call re scan.

## 2016-07-30 NOTE — Progress Notes (Signed)
Bonnie Peterson    HEMATOLOGY/ONCOLOGY CLINIC NOTE  Date of Service: 07/30/2016    Patient Care Team: Veatrice Bourbon, MD as PCP - General (Family Medicine) Aquilla Hacker, MD (Inactive) as Resident (Family Medicine)  CHIEF COMPLAINTS/PURPOSE OF CONSULTATION:   F/u for Cholangiocarcinoma s/p completion of concurrent chemo-Radiation.  DIAGNOSIS  Stage IVA (T2a, pN1, M0) Intrahepatic Cholangiocarcinoma  Current Treatment  Active Surveillance  Previous treatment 02/09/15 L lobe of liver partial hepatectomy segments 2, 3, 4a, 4b invasive cholangiocarcinoma, moderately differentiated. Tumor focally extends to the parenchymal margin of resection. Uninvolved liver parenchyma with steatohepatitis and associated periportal and centrilobular pericellular fibrosis. Scattered von Meyenburg complexes. One hepatic artery LN with metastatic cholangiocarcinoma. Fibroadipose tissue and nerve negative for malignancy. Gallbladder negative for malignancy. One LN negative for malignancy. T2aN1 invasive cholangiocarcinoma, moderately differentiated, with positive hepatic parenchymal margin, and 1/2 nodes positive for tumor involvement.  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  Completed adjuvant gemcitabine x 4 months  Interval History  Bonnie Peterson here for follow her scheduled 3 month followup for her intrahepatic cholangiocarcinoma. Patient notes no new abdominal pain.  No other acute new focal symptoms. Patient notes that she is eating well. Has gained about 9 lbs since her last clinic visit. She continues to smoke Marijuana. Is also smoking 10 cigarettes daily. We discussed smoking and drug use cessation. Has a boil on her inner thigh likely for skin friction. Recommend using dressing or appropriate clothes to avoid  friction, warm compresses and topical clindamycin. If increase size or cellulitis/abscess formation might need drainage/antibiotics. Patient notes she has a followup with her PCP next week and shall keep an eye on this.  She has not gotten her CT chest/abd/pelvis as planned.  No other acute new concerns.   MEDICAL HISTORY:  Past Medical History:  Diagnosis Date  . Anxiety   . Benign positional vertigo   . [facility] [place] [place]   intrahepatic cholangiocarcinoma  . COPD (chronic obstructive pulmonary disease) ([facility]   . Depression   . Diabetes mellitus without complication ([facility]   . Early cataracts, bilateral 10/13   Optho, Dr SGershon Crane . Echocardiogram findings abnormal, without diagnosis 10/10   10/10: mild pulm HTN, EF 60-65%, mild LVH, moderate aortic regurg  . GERD (gastroesophageal reflux disease)    I have "acid reflux"  . Gout   . HLD (hyperlipidemia)   . HTN, goal below 130/80   . Irritable bowel syndrome   . Obesity, Class III, BMI 40-49.9 (morbid obesity) ([facility]   . Obstructive sleep apnea    wears cpap  . Osteoarthritis (arthritis due to wear and tear of joints)    also gout  . Restrictive lung disease    SURGICAL HISTORY: Past Surgical History:  Procedure Laterality Date  . ABDOMINAL HYSTERECTOMY    . ESOPHAGOGASTRODUODENOSCOPY N/A 06/15/2015   Procedure: ESOPHAGOGASTRODUODENOSCOPY (EGD);  Surgeon: CGatha Mayer MD;  [facility];  Service: Endoscopy;  Laterality: N/A;  . LIVER BIOPSY    . OPEN PARTIAL HEPATECTOMY  Left 02/09/15  . PARTIAL HYSTERECTOMY    . ROTATOR CUFF REPAIR       L rotator cuff repair 11/07-[place] - 12/7/200  . TONSILLECTOMY      SOCIAL HISTORY: Social History   Social History  . Marital status: Divorced    Spouse name: N/A  .  Number of children: 4  . Years of education: N/A   Occupational History  . unemployed    Social History Main Topics  . Smoking status: Current Some Day Smoker    Packs/day: 1.00    Years: 49.00      Types: Cigarettes  . Smokeless tobacco: Never Used  . Alcohol use 0.0 oz/week     Comment: occasionally  . Drug use: Yes    Types: Marijuana  . Sexual activity: No   Other Topics Concern  . 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 Breast cancer Mother   . Hypertension Mother   . Coronary artery disease Mother   . Diabetes type II Mother   . Rheum arthritis Mother   . Diabetes type II Sister   . Pancreatic cancer Sister     ALLERGIES:  is allergic to other.  MEDICATIONS:  Current Outpatient Prescriptions  Medication Sig Dispense Refill  . alendronate (FOSAMAX) 70 MG tablet TAKE 1 TABLET  ONCE A WEEK. 12 tablet 2  . allopurinol (ZYLOPRIM) 100 MG tablet Take 1 tablet (100 mg total) by mouth daily. 30 tablet 1  . amLODipine (NORVASC) 10 MG tablet Take 1 tablet (10 mg total) by mouth daily. 90 tablet 3  . aspirin EC 81 MG tablet Take 1 tablet (81 mg total) by mouth daily. 90 tablet 2  . cholecalciferol (VITAMIN D) 1000 UNITS tablet Take 1 tablet (1,000 Units total) by mouth daily. 30 tablet 11  . clindamycin (CLINDAGEL) 1 % gel Apply topically 2 (two) times daily. Boil on the thigh 30 g 0  . diclofenac sodium (VOLTAREN) 1 % GEL Apply 2 g topically 4 (four) times daily as needed. (Patient taking differently: Apply 2 g topically 4 (four) times daily as needed (knee pain, elbow and hands). ) 100 g 4  . fish oil-omega-3 fatty acids 1000 MG capsule Take 1 g by mouth every 30 (thirty) days.     . fluticasone (FLONASE) 50 MCG/ACT nasal spray Place 2 sprays into both nostrils daily as needed for rhinitis. 16 g 2  .  Hyprom-Naphaz-Polysorb-Zn Sulf (CLEAR EYES COMPLETE OP) Place 1 drop into both eyes daily as needed (dry eyes).     . Lidocaine-Prilocaine, Bulk, 2.5-2.5 % CREA Apply 5 mLs topically as needed (for port-a-cath use). 1 hour before use, cover with Band aid or plastic wrap to seal 30 g prn  . lisinopril-hydrochlorothiazide (PRINZIDE,ZESTORETIC) 20-25 MG tablet Take 1 tablet by mouth daily. 90 tablet 3  . loratadine (CLARITIN) 10 MG tablet Take 1 tablet (10 mg total) by mouth daily as needed for allergies. 30 tablet 3  . LORazepam (ATIVAN) 0.5 MG tablet Take 1 tablet (0.5 mg total) by mouth every 8 (eight) hours as needed for anxiety. 30 tablet 1  . Multiple Vitamin (MULTIVITAMIN WITH MINERALS) TABS tablet Take 1 tablet by mouth daily.    . ondansetron (ZOFRAN) 8 MG tablet Take by mouth every 8 (eight) hours as needed for nausea or vomiting.    Bonnie Peterson PROAIR HFA 108 (90 Base) MCG/ACT  inhaler INHALE 2 PUFFS INTO THE LUNGS EVERY 4 HOURS AS NEEDED FOR WHEEZING OR SHORTNESS OF BREATH 1 Inhaler 0  . traMADol (ULTRAM) 50 MG tablet Take 1 tablet (50 mg total) by mouth every 6 (six) hours as needed for moderate pain or severe pain. 60 tablet 0  . VITAMIN E PO Take 1 tablet by mouth daily.      No current facility-administered medications for this visit.     REVIEW OF SYSTEMS:    10 Point review of Systems was done is negative except as noted above.  PHYSICAL EXAMINATION: ECOG PERFORMANCE STATUS: 1 - Symptomatic but completely ambulatory Vital signs reviewed in Epic Afebrile temperature 98.6, breathing at 18, pulse 66/m, blood pressure 155/58.  GENERAL:alert, in no acute distress and comfortable SKIN: skin color, texture, turgor are normal, no rashes or significant lesions EYES: normal, conjunctiva are pink and non-injected, sclera clear OROPHARYNX: Mucous membranes moist NECK: supple, no JVD, thyroid normal size, non-tender, without nodularity LYMPH:  no palpable lymphadenopathy in the cervical, axillary  or inguinal LUNGS: clear to auscultation with normal respiratory effort HEART: regular rate & rhythm,  no murmurs and no lower extremity edema ABDOMEN: abdomen Soft with normoactive bowel sounds, No guarding rigidity or rebound. Musculoskeletal: no cyanosis of digits and no clubbing  PSYCH: alert & oriented x 3 with fluent speech NEURO: no focal motor/sensory deficits  LABORATORY DATA:  . CBC Latest Ref Rng & Units 07/30/2016 04/30/2016 01/19/2016  WBC 3.9 - 10.3 10e3/uL 7.7 6.0 6.7  Hemoglobin 11.6 - 15.9 g/dL 11.6 11.7 11.7  Hematocrit 34.8 - 46.6 % 36.4 36.5 36.8  Platelets 145 - 400 10e3/uL 141(L) 126(L) 169   . CBC    Component Value Date/Time   WBC 7.7 07/30/2016 0945   WBC 6.3 09/07/2015 0850   RBC 4.08 07/30/2016 0945   RBC 4.28 09/07/2015 0850   HGB 11.6 07/30/2016 0945   HCT 36.4 07/30/2016 0945   PLT 141 (L) 07/30/2016 0945   MCV 89.2 07/30/2016 0945   MCH 28.4 07/30/2016 0945   MCH 28.7 09/07/2015 0850   MCHC 31.9 07/30/2016 0945   MCHC 31.5 09/07/2015 0850   RDW 14.3 07/30/2016 0945   LYMPHSABS 1.9 07/30/2016 0945   MONOABS 0.8 07/30/2016 0945   EOSABS 0.1 07/30/2016 0945   BASOSABS 0.0 07/30/2016 0945   . CMP Latest Ref Rng & Units 07/30/2016 07/03/2016 06/19/2016  Glucose 70 - 140 mg/dl 106 87 79  BUN 7.0 - 26.0 mg/dL 13.3 17 22   Creatinine 0.6 - 1.1 mg/dL 0.7 0.72 0.66  Sodium 136 - 145 mEq/L 139 142 139  Potassium 3.5 - 5.1 mEq/L 4.0 5.1 4.4  Chloride 96 - 106 mmol/L - 101 104  CO2 22 - 29 mEq/L 26 21 28   Calcium 8.4 - 10.4 mg/dL 9.2 9.4 9.1  Total Protein 6.4 - 8.3 g/dL 6.9 7.3 6.6  Total Bilirubin 0.20 - 1.20 mg/dL 0.37 0.2 0.3  Alkaline Phos 40 - 150 U/L 119 115 96  AST 5 - 34 U/L 14 19 15   ALT 0 - 55 U/L 12 11 10    Component     Latest Ref Rng & Units 04/30/2016  CA 19-9     0 - 35 U/mL 10  CEA ([facility])     0.00 - 5.00 ng/mL 1.63    RADIOGRAPHIC STUDIES: I have personally reviewed the radiological images as listed and agreed with  the findings in the report.  .No results found.    ASSESSMENT &  PLAN:   71 yo with multiple medical co-morbids  1) Intrahepatic invasive cholangiocarcinoma T2a N1 M0 (Stage IVA) with focal positive parenchymal margins and 1/2 LN positive. CT C/A/P on 04/03/2016 showed no evidence of cholangiocarcinoma local recurrence or metastases at this time. Patient  has completed her adjuvant concurrent chemo-radiation  on 05/25/2015 . She did overall quite well with treatment without any overt prohibitive toxicities. 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  Required treatment with high-dose steroids and dietary modifications.  Symptoms progressive to have resolved.  Currently on PPI for overt symptoms. CT chest abdomen pelvis in March 2017 showed no evidence of disease progression. Patient was seen at [facility] by Dr. Dennison Nancy and was given the option of observation versus adjuvant gemcitabine. She had a followup at [facility] with a CT Chest abdomen pelvis on 10/16/2015 which showed no evidence of recurrent or metastatic disease. Patient has completed 4 months of Adjuvant gemcitabine as per recommendation from [place]. Rpt CT C/A/P  On 01/17/2016 -- shows no evidence of disease recurrence/progression.  Plan -No clinical evidence of disease progression at this time  -she was to have a rpt CT chest/abd/pelvis which she did not schedule. This has been re-ordered and scheduled for 08/06/2016 -we shall review her CT chest/abd/pelvis when done. -unless any new concerns on imaging we shall plan to see her back in 3 months for continued followup. -She was encouraged to maintain good physical activity level and optimize oral intake.  2) Smoking and marijuana use -counseled regarding smoking and drug use cessation  3) Mild thrombocytopenia-nearly resolved -will monitor.  4) rt inner thigh furuncle Plan Recommend using dressing or appropriate clothes to avoid friction warm  compresses and  topical clindamycin- prescription sent.  If increase size or cellulitis/abscess formation might need drainage/antibiotics.  Patient notes she has a followup with her PCP next week and shall keep an eye on this.  CT chest/abd/pelvis in 1 week RTC with Dr Irene Limbo in 3 months with labs. Earlier if needed based on CT results      Sullivan Lone MD [place] AAHIVMS [facility] Hematology/Oncology Physician [facility]  (Office):       [phone] (Work cell):  [phone] (Fax):           [phone]

## 2016-07-30 NOTE — Patient Instructions (Signed)
Thank you for choosing [facility] to provide your oncology and hematology care.  To afford each patient quality time with our providers, please arrive 30 minutes before your scheduled appointment time.  If you arrive late for your appointment, you may be asked to reschedule.  We strive to give you quality time with our providers, and arriving late affects you and other patients whose appointments are after yours.  If you are a no show for multiple scheduled visits, you may be dismissed from the clinic at the providers discretion.   Again, thank you for choosing [facility], our hope is that these requests will decrease the amount of time that you wait before being seen by our physicians.  ______________________________________________________________________ Should you have questions after your visit to the  Cancer Center, please contact our office at [phone] between the hours of 8:30 and 4:30 p.m.    Voicemails left after 4:30p.m will not be returned until the following business day.   For prescription refill requests, please have your pharmacy contact us directly.  Please also try to allow 48 hours for prescription requests.   Please contact the scheduling department for questions regarding scheduling.  For scheduling of procedures such as PET scans, CT scans, MRI, Ultrasound, etc please contact central scheduling at [phone].   Resources For Cancer Patients and Caregivers:  American Cancer Society:  800-227-2345  Can help patients locate various types of support and financial assistance Cancer Care: 1-800-813-HOPE (4673) Provides financial assistance, online support groups, medication/co-pay assistance.   [place] DSS:  336-641-3447 Where to apply for food stamps, Medicaid, and utility assistance Medicare Rights Center: 800-333-4114 Helps people with Medicare understand their rights and benefits, navigate the Medicare system, and secure the  quality healthcare they deserve SCAT: 336-333-6589 [place] [employer]'s shared-ride transportation service for eligible riders who have a disability that prevents them from riding the fixed route bus.   For additional information on assistance programs please contact our social worker:   Grier Hock/Abigail Elmore:  [phone] 

## 2016-07-30 NOTE — Patient Instructions (Signed)
Implanted Port Home Guide An implanted port is a type of central line that is placed under the skin. Central lines are used to provide IV access when treatment or nutrition needs to be given through a person's veins. Implanted ports are used for long-term IV access. An implanted port may be placed because:  You need IV medicine that would be irritating to the small veins in your hands or arms.  You need long-term IV medicines, such as antibiotics.  You need IV nutrition for a long period.  You need frequent blood draws for lab tests.  You need dialysis.  Implanted ports are usually placed in the chest area, but they can also be placed in the upper arm, the abdomen, or the leg. An implanted port has two main parts:  Reservoir. The reservoir is round and will appear as a small, raised area under your skin. The reservoir is the part where a needle is inserted to give medicines or draw blood.  Catheter. The catheter is a thin, flexible tube that extends from the reservoir. The catheter is placed into a large vein. Medicine that is inserted into the reservoir goes into the catheter and then into the vein.  How will I care for my incision site? Do not get the incision site wet. Bathe or shower as directed by your health care provider. How is my port accessed? Special steps must be taken to access the port:  Before the port is accessed, a numbing cream can be placed on the skin. This helps numb the skin over the port site.  Your health care provider uses a sterile technique to access the port. ? Your health care provider must put on a mask and sterile gloves. ? The skin over your port is cleaned carefully with an antiseptic and allowed to dry. ? The port is gently pinched between sterile gloves, and a needle is inserted into the port.  Only "non-coring" port needles should be used to access the port. Once the port is accessed, a blood return should be checked. This helps ensure that the port  is in the vein and is not clogged.  If your port needs to remain accessed for a constant infusion, a clear (transparent) bandage will be placed over the needle site. The bandage and needle will need to be changed every week, or as directed by your health care provider.  Keep the bandage covering the needle clean and dry. Do not get it wet. Follow your health care provider's instructions on how to take a shower or bath while the port is accessed.  If your port does not need to stay accessed, no bandage is needed over the port.  What is flushing? Flushing helps keep the port from getting clogged. Follow your health care provider's instructions on how and when to flush the port. Ports are usually flushed with saline solution or a medicine called heparin. The need for flushing will depend on how the port is used.  If the port is used for intermittent medicines or blood draws, the port will need to be flushed: ? After medicines have been given. ? After blood has been drawn. ? As part of routine maintenance.  If a constant infusion is running, the port may not need to be flushed.  How long will my port stay implanted? The port can stay in for as long as your health care provider thinks it is needed. When it is time for the port to come out, surgery will be   done to remove it. The procedure is similar to the one performed when the port was put in. When should I seek immediate medical care? When you have an implanted port, you should seek immediate medical care if:  You notice a bad smell coming from the incision site.  You have swelling, redness, or drainage at the incision site.  You have more swelling or pain at the port site or the surrounding area.  You have a fever that is not controlled with medicine.  This information is not intended to replace advice given to you by your health care provider. Make sure you discuss any questions you have with your health care provider. Document  Released: 03/25/2005 Document Revised: 08/31/2015 Document Reviewed: 11/30/2012 Elsevier Interactive Patient Education  2017 Elsevier Inc.  

## 2016-08-05 ENCOUNTER — Ambulatory Visit (INDEPENDENT_AMBULATORY_CARE_PROVIDER_SITE_OTHER): Payer: Medicare HMO | Admitting: Student

## 2016-08-05 ENCOUNTER — Encounter: Payer: Self-pay | Admitting: Student

## 2016-08-05 VITALS — BP 110/72 | HR 59 | Temp 97.9°F | Wt 165.0 lb

## 2016-08-05 DIAGNOSIS — I10 Essential (primary) hypertension: Secondary | ICD-10-CM

## 2016-08-05 DIAGNOSIS — M1612 Unilateral primary osteoarthritis, left hip: Secondary | ICD-10-CM

## 2016-08-05 DIAGNOSIS — R229 Localized swelling, mass and lump, unspecified: Secondary | ICD-10-CM | POA: Diagnosis not present

## 2016-08-05 MED ORDER — DICLOFENAC SODIUM 1 % TD GEL: 2.0000 g | Freq: Four times a day (QID) | TRANSDERMAL | 4 refills | Status: DC | PRN

## 2016-08-05 MED FILL — PROLENSA 0.07% EYE DROPS: 0.07 | 60 days supply | Qty: 3 | Fill #0

## 2016-08-05 NOTE — Assessment & Plan Note (Signed)
Stable with increase in prinzide.  - BMP today to recheck renal function - follow as needed

## 2016-08-05 NOTE — Patient Instructions (Signed)
Follow up as needed Call the office with questions or concerns

## 2016-08-05 NOTE — Assessment & Plan Note (Signed)
Lump of right groin, improving with topical clindamycin. Exam findings non consistent with abscess, possible swollen lymph node versus lipoma - will follow as needed - if not improved in the next several weeks, consider biopsy

## 2016-08-05 NOTE — Progress Notes (Signed)
   Subjective:    Patient ID: Bonnie Peterson, female    DOB: 26-Jan-1946, 71 y.o.   MRN: [id number]   CC: BP follow up  HPI: 71 y/o F presents for BP follow up  BP follow up - prinzide increased at last visit -she has been checking her BP at home and noted that her Bps have been " good" - she lost the paper she was using to write them down on - no lightheadedness or dizziness  Lump on groin - noted one week ago - initially was painful but her oncologist started her on clindamycin gel and it has started to improve  Smoking status reviewed  Review of Systems  Per HPI, else denies recent illness, fever, chest pain, shortness of breath,    Objective:  BP 110/72   Pulse (!) 59   Temp 97.9 F (36.6 C) (Oral)   Wt 165 lb (74.8 kg)   SpO2 99%   BMI 29.23 kg/m  Vitals and nursing note reviewed  General: NAD Cardiac: RRR,  Respiratory: CTAB, normal effort Skin: approx 1 cm round subdermal firm, mobile swelling noted in the right groin adjacent to the perineum, non tender else skin is warm and dry, no rashes noted Neuro: alert and oriented, no focal deficits   Assessment & Plan:    HYPERTENSION, BENIGN SYSTEMIC Stable with increase in prinzide.  - BMP today to recheck renal function - follow as needed  Lump of skin Lump of right groin, improving with topical clindamycin. Exam findings non consistent with abscess, possible swollen lymph node versus lipoma - will follow as needed - if not improved in the next several weeks, consider biopsy    Bonnie Peterson A. Lincoln Brigham MD, [facility] Resident PGY-3 Pager [phone]

## 2016-08-06 ENCOUNTER — Ambulatory Visit (HOSPITAL_COMMUNITY)
Admission: RE | Admit: 2016-08-06 | Discharge: 2016-08-06 | Disposition: A | Discharge status: Home / Self Care | Payer: Medicare HMO | Source: Ambulatory Visit | Attending: Hematology | Admitting: Hematology

## 2016-08-06 ENCOUNTER — Encounter (HOSPITAL_COMMUNITY): Payer: Self-pay

## 2016-08-06 DIAGNOSIS — I700 Atherosclerosis of aorta: Secondary | ICD-10-CM | POA: Diagnosis not present

## 2016-08-06 DIAGNOSIS — I2510 Atherosclerotic heart disease of native coronary artery without angina pectoris: Secondary | ICD-10-CM | POA: Diagnosis not present

## 2016-08-06 DIAGNOSIS — J432 Centrilobular emphysema: Secondary | ICD-10-CM | POA: Diagnosis not present

## 2016-08-06 DIAGNOSIS — R911 Solitary pulmonary nodule: Secondary | ICD-10-CM | POA: Insufficient documentation

## 2016-08-06 DIAGNOSIS — C221 Intrahepatic bile duct carcinoma: Secondary | ICD-10-CM

## 2016-08-06 DIAGNOSIS — Z923 Personal history of irradiation: Secondary | ICD-10-CM | POA: Diagnosis not present

## 2016-08-06 LAB — BASIC METABOLIC PANEL
BUN/Creatinine Ratio: 19 (ref 12–28)
BUN: 12 mg/dL (ref 8–27)
CO2: 22 mmol/L (ref 18–29)
Calcium: 9.6 mg/dL (ref 8.7–10.3)
Chloride: 103 mmol/L (ref 96–106)
Creatinine, Ser: 0.62 mg/dL (ref 0.57–1.00)
GFR calc Af Amer: 106 mL/min/{1.73_m2} (ref >59)
GFR calc non Af Amer: 92 mL/min/{1.73_m2} (ref >59)
Glucose: 82 mg/dL (ref 65–99)
Potassium: 5 mmol/L (ref 3.5–5.2)
Sodium: 144 mmol/L (ref 134–144)

## 2016-08-06 MED ORDER — IOPAMIDOL (ISOVUE-300) INJECTION 61%
INTRAVENOUS | Status: AC
Start: 2016-08-06 — End: 2016-08-06
  Administered 2016-08-06: 100 mL
  Filled 2016-08-06: qty 100

## 2016-08-06 MED ORDER — HEPARIN SOD (PORK) LOCK FLUSH 100 UNIT/ML IV SOLN
INTRAVENOUS | Status: AC
  Administered 2016-08-06: 500 [IU]
  Filled 2016-08-06: qty 5

## 2016-08-06 MED ORDER — IOPAMIDOL (ISOVUE-300) INJECTION 61%
INTRAVENOUS | Status: AC
  Filled 2016-08-06: qty 100

## 2016-08-08 MED FILL — POLYMYXIN B/TMP EYE DROPS: 10000-0.1 | 50 days supply | Qty: 10 | Fill #0

## 2016-08-08 MED FILL — PREDNISOLONE AC 1% EYE DROP: 1 | 25 days supply | Qty: 5 | Fill #0

## 2016-08-15 ENCOUNTER — Other Ambulatory Visit: Payer: Self-pay | Admitting: *Deleted

## 2016-08-16 ENCOUNTER — Other Ambulatory Visit: Payer: Self-pay | Admitting: *Deleted

## 2016-08-16 MED ORDER — TRAMADOL HCL 50 MG PO TABS: 50.0000 mg | ORAL_TABLET | Freq: Four times a day (QID) | ORAL | 0 refills | Status: DC | PRN

## 2016-08-16 MED ORDER — AMLODIPINE BESYLATE 10 MG PO TABS: 10.0000 mg | ORAL_TABLET | Freq: Every day | ORAL | 3 refills | Status: DC

## 2016-08-19 MED ORDER — ALLOPURINOL 100 MG PO TABS: 100.0000 mg | ORAL_TABLET | Freq: Every day | ORAL | 0 refills | Status: DC

## 2016-08-20 ENCOUNTER — Other Ambulatory Visit: Payer: Self-pay | Admitting: Family Medicine

## 2016-08-20 MED ORDER — AMLODIPINE BESYLATE 10 MG PO TABS: 10.0000 mg | ORAL_TABLET | Freq: Every day | ORAL | 0 refills | Status: DC

## 2016-08-20 MED ORDER — TRAMADOL HCL 50 MG PO TABS: 50.0000 mg | ORAL_TABLET | Freq: Four times a day (QID) | ORAL | 0 refills | Status: DC | PRN

## 2016-08-20 NOTE — Progress Notes (Signed)
Per RN 08/16/16 Tramadol Rx written by Dr Lincoln Brigham not received by pharmacy.  This is to replace that Rx.  Future refills to be done by Dr Altha Harm. Lajuana Ripple, DO PGY-3, [facility]

## 2016-08-20 NOTE — Progress Notes (Signed)
Per RN 08/16/16 Norvasc Rx written by Dr Lincoln Brigham not received by pharmacy.  This is to replace that Rx.  Future refills to be done by Dr Marina Goodell

## 2016-08-26 ENCOUNTER — Telehealth: Payer: Self-pay | Admitting: *Deleted

## 2016-08-26 NOTE — Telephone Encounter (Signed)
Per staff message, attempted to call pt to inform CT scan showed no recurrence/progression of disease.  Number listed for pt not operating, no VM available.

## 2016-08-26 NOTE — Telephone Encounter (Signed)
-----   Message from Brunetta Genera, MD sent at 08/25/2016 11:41 PM EDT ----- Bonnie Peterson, Please let the patient know that her CT C/A/P showed No findings to suggest local recurrence of disease or definite metastatic disease elsewhere in the chest, abdomen or pelvis.  Thanks [place]

## 2016-08-27 ENCOUNTER — Other Ambulatory Visit: Payer: Self-pay | Admitting: *Deleted

## 2016-08-28 MED ORDER — TRAMADOL HCL 50 MG PO TABS: 50.0000 mg | ORAL_TABLET | Freq: Four times a day (QID) | ORAL | 0 refills | Status: DC | PRN

## 2016-09-10 ENCOUNTER — Ambulatory Visit (HOSPITAL_BASED_OUTPATIENT_CLINIC_OR_DEPARTMENT_OTHER): Payer: Medicare HMO

## 2016-09-10 ENCOUNTER — Telehealth: Payer: Self-pay | Admitting: Hematology

## 2016-09-10 DIAGNOSIS — Z95828 Presence of other vascular implants and grafts: Secondary | ICD-10-CM

## 2016-09-10 DIAGNOSIS — C221 Intrahepatic bile duct carcinoma: Secondary | ICD-10-CM

## 2016-09-10 DIAGNOSIS — Z452 Encounter for adjustment and management of vascular access device: Secondary | ICD-10-CM | POA: Diagnosis not present

## 2016-09-10 MED ORDER — HEPARIN SOD (PORK) LOCK FLUSH 100 UNIT/ML IV SOLN
500.0000 [IU] | Freq: Once | INTRAVENOUS | Status: AC | PRN
  Administered 2016-09-10: 500 [IU] via INTRAVENOUS
  Filled 2016-09-10: qty 5

## 2016-09-10 MED ORDER — SODIUM CHLORIDE 0.9 % IJ SOLN
10.0000 mL | INTRAMUSCULAR | Status: DC | PRN
  Administered 2016-09-10: 10 mL via INTRAVENOUS
  Filled 2016-09-10: qty 10

## 2016-09-10 NOTE — Telephone Encounter (Signed)
Scheduled appt per sch message and patient request - gave patient calender.

## 2016-09-17 ENCOUNTER — Encounter: Payer: Self-pay | Admitting: Student

## 2016-09-17 ENCOUNTER — Ambulatory Visit (INDEPENDENT_AMBULATORY_CARE_PROVIDER_SITE_OTHER): Payer: Medicare HMO | Admitting: Student

## 2016-09-17 VITALS — BP 122/62 | HR 63 | Temp 98.3°F | Wt 168.0 lb

## 2016-09-17 DIAGNOSIS — Z0000 Encounter for general adult medical examination without abnormal findings: Secondary | ICD-10-CM

## 2016-09-17 DIAGNOSIS — I10 Essential (primary) hypertension: Secondary | ICD-10-CM

## 2016-09-17 MED ORDER — ATENOLOL 25 MG PO TABS: 25.0000 mg | ORAL_TABLET | Freq: Every day | ORAL | 3 refills | Status: DC

## 2016-09-17 NOTE — Assessment & Plan Note (Signed)
Mammo once port site removed, lipid panel today

## 2016-09-17 NOTE — Progress Notes (Signed)
   Subjective:    Patient ID: Bonnie Peterson, female    DOB: 19-Jan-1946, 71 y.o.   MRN: [id number]   CC: Follow up  HPI: 71 y/o F presents for follow up  HTN - reports compliance with BP medication regimen - no headache, chest pain or SOB  Health care maintenance - due for mammogram and lipid panel  Smoking status reviewed  Review of Systems  Per HPI, else denies recent illness, fever   Objective:  BP 122/62   Pulse 63   Temp 98.3 F (36.8 C) (Oral)   Wt 168 lb (76.2 kg)   SpO2 98%   BMI 29.76 kg/m  Vitals and nursing note reviewed  General: NAD Cardiac: RRR Respiratory: CTAB, normal effort Extremities: no edema or cyanosis. WWP. Skin: warm and dry, no rashes noted Neuro: alert and oriented, no focal deficits   Assessment & Plan:    HYPERTENSION, BENIGN SYSTEMIC BP initially elevated to 158/64 but on repeat was  - continue norvasc,.atenolol and prinzide - follow up in 3 months for BP  Healthcare maintenance Mammo once port site removed, lipid panel today    Allura Doepke A. Lincoln Brigham MD, [facility] Resident PGY-3 Pager [phone]

## 2016-09-17 NOTE — Assessment & Plan Note (Addendum)
BP initially elevated to 158/64 but on repeat was 122/62 - continue norvasc,.atenolol and prinzide - follow up in 3 months for BP

## 2016-09-17 NOTE — Patient Instructions (Signed)
Follow up in 3 months for BP check You will have a letter sent to you for your results Call the office with questions or concerns

## 2016-09-18 LAB — LIPID PANEL
Chol/HDL Ratio: 4.3 ratio (ref 0.0–4.4)
Cholesterol, Total: 176 mg/dL (ref 100–199)
HDL: 41 mg/dL (ref >39)
LDL Calculated: 110 mg/dL — ABNORMAL HIGH (ref 0–99)
Triglycerides: 124 mg/dL (ref 0–149)
VLDL Cholesterol Cal: 25 mg/dL (ref 5–40)

## 2016-09-24 LAB — HM DIABETES EYE EXAM

## 2016-09-26 ENCOUNTER — Encounter: Payer: Self-pay | Admitting: Hematology

## 2016-09-26 ENCOUNTER — Ambulatory Visit (HOSPITAL_BASED_OUTPATIENT_CLINIC_OR_DEPARTMENT_OTHER): Payer: Medicare HMO | Admitting: Hematology

## 2016-09-26 VITALS — BP 148/56 | HR 56 | Temp 97.5°F | Resp 20 | Ht 63.0 in | Wt 168.4 lb

## 2016-09-26 DIAGNOSIS — F1290 Cannabis use, unspecified, uncomplicated: Secondary | ICD-10-CM

## 2016-09-26 DIAGNOSIS — C221 Intrahepatic bile duct carcinoma: Secondary | ICD-10-CM

## 2016-09-26 DIAGNOSIS — Z720 Tobacco use: Secondary | ICD-10-CM

## 2016-09-26 NOTE — Patient Instructions (Signed)
Thank you for choosing [facility] to provide your oncology and hematology care.  To afford each patient quality time with our providers, please arrive 30 minutes before your scheduled appointment time.  If you arrive late for your appointment, you may be asked to reschedule.  We strive to give you quality time with our providers, and arriving late affects you and other patients whose appointments are after yours.   If you are a no show for multiple scheduled visits, you may be dismissed from the clinic at the providers discretion.    Again, thank you for choosing [facility], our hope is that these requests will decrease the amount of time that you wait before being seen by our physicians.  ______________________________________________________________________  Should you have questions after your visit to the [facility], please contact our office at [phone] between the hours of 8:30 and 4:30 p.m.    Voicemails left after 4:30p.m will not be returned until the following business day.    For prescription refill requests, please have your pharmacy contact us directly.  Please also try to allow 48 hours for prescription requests.    Please contact the scheduling department for questions regarding scheduling.  For scheduling of procedures such as PET scans, CT scans, MRI, Ultrasound, etc please contact central scheduling at [phone].    Resources For Cancer Patients and Caregivers:   Oncolink.org:  A wonderful resource for patients and healthcare providers for information regarding your disease, ways to tract your treatment, what to expect, etc.     American Cancer Society:  800-227-2345  Can help patients locate various types of support and financial assistance  Cancer Care: 1-800-813-HOPE (4673) Provides financial assistance, online support groups, medication/co-pay assistance.    [place] DSS:  336-641-3447 Where to apply for food  stamps, Medicaid, and utility assistance  Medicare Rights Center: 800-333-4114 Helps people with Medicare understand their rights and benefits, navigate the Medicare system, and secure the quality healthcare they deserve  SCAT: 336-333-6589 [employer]'s shared-ride transportation service for eligible riders who have a disability that prevents them from riding the fixed route bus.    For additional information on assistance programs please contact our social worker:   Grier Hock/Abigail Elmore:  [phone]            

## 2016-09-27 ENCOUNTER — Telehealth: Payer: Self-pay | Admitting: Hematology

## 2016-09-27 NOTE — Telephone Encounter (Signed)
6/21 - no los

## 2016-10-07 NOTE — Progress Notes (Signed)
[facility]    HEMATOLOGY/ONCOLOGY CLINIC NOTE  Date of Service: 09/26/2016    Patient Care Team: Bufford Lope, DO as PCP - General Melancon, York Ram, MD (Inactive) as Resident (Family Medicine)  CHIEF COMPLAINTS/PURPOSE OF CONSULTATION:   F/u for Cholangiocarcinoma   DIAGNOSIS  Stage IVA (T2a, pN1, M0) Intrahepatic Cholangiocarcinoma  Current Treatment  Active Surveillance  Previous treatment 02/09/15 L lobe of liver partial hepatectomy segments 2, 3, 4a, 4b invasive cholangiocarcinoma, moderately differentiated. Tumor focally extends to the parenchymal margin of resection. Uninvolved liver parenchyma with steatohepatitis and associated periportal and centrilobular pericellular fibrosis. Scattered von Meyenburg complexes. One hepatic artery LN with metastatic cholangiocarcinoma. Fibroadipose tissue and nerve negative for malignancy. Gallbladder negative for malignancy. One LN negative for malignancy. T2aN1 invasive cholangiocarcinoma, moderately differentiated, with positive hepatic parenchymal margin, and 1/2 nodes positive for tumor involvement.  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  Completed adjuvant gemcitabine x 4 months  Interval History  Ms. MTisonis here for her earlier than scheduled f/u to discuss her CT results. Labs are stable. CT chest/abd/pelvis was done and shows no overt evidence of recurrence of her malignancy. No other acute new concerns. She notes that she is eating well and is in good spirits.  MEDICAL HISTORY:  Past Medical History:  Diagnosis Date  . Anxiety   . Benign positional vertigo   . Cancer ([facility] 02/09/15   intrahepatic cholangiocarcinoma  . COPD (chronic obstructive pulmonary disease) ([facility]   . Depression   . Diabetes mellitus without complication ([facility]     . Early cataracts, bilateral 10/13   Optho, Dr SGershon Crane . Echocardiogram findings abnormal, without diagnosis 10/10   10/10: mild pulm HTN, EF 60-65%, mild LVH, moderate aortic regurg  . GERD (gastroesophageal reflux disease)    I have "acid reflux"  . Gout   . HLD (hyperlipidemia)   . HTN, goal below 130/80   . Irritable bowel syndrome   . Obesity, Class III, BMI 40-49.9 (morbid obesity) ([place]   . Obstructive sleep apnea    wears cpap  . Osteoarthritis (arthritis due to wear and tear of joints)    also gout  . Restrictive lung disease    SURGICAL HISTORY: Past Surgical History:  Procedure Laterality Date  . ABDOMINAL HYSTERECTOMY    . ESOPHAGOGASTRODUODENOSCOPY N/A 06/15/2015   Procedure: ESOPHAGOGASTRODUODENOSCOPY (EGD);  Surgeon: CGatha Mayer MD;  [facility];  Service: Endoscopy;  Laterality: N/A;  . LIVER BIOPSY    . OPEN PARTIAL HEPATECTOMY  Left 02/09/15  . PARTIAL HYSTERECTOMY    . ROTATOR CUFF REPAIR       L rotator cuff repair 11/07-[facility] - 12/7/200  . TONSILLECTOMY      SOCIAL HISTORY: Social History   Social History  . Marital status: Divorced    Spouse name: N/A  . Number of children: 4  . Years of education: N/A   Occupational History  . unemployed    Social History Main Topics  . Smoking status: Current Some Day Smoker    Packs/day: 1.00    Years: 49.00    Types: Cigarettes  . Smokeless tobacco: Never Used  . Alcohol use 0.0 oz/week     Comment: occasionally  . Drug use: Yes    Types: Marijuana  . Sexual activity: No   Other Topics Concern  .  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 Breast cancer Mother   . Hypertension Mother   . Coronary artery disease Mother   . Diabetes type II Mother   . Rheum arthritis Mother   . Diabetes type II Sister   . Pancreatic cancer Sister     ALLERGIES:  is allergic to other.  MEDICATIONS:  Current Outpatient Prescriptions  Medication Sig Dispense Refill  . alendronate (FOSAMAX) 70 MG tablet TAKE 1 TABLET  ONCE A WEEK. 12 tablet 2  . allopurinol (ZYLOPRIM) 100 MG tablet Take 1 tablet (100 mg total) by mouth daily. 90 tablet 0  . amLODipine (NORVASC) 10 MG tablet Take 1 tablet (10 mg total) by mouth daily. 90 tablet 0  . aspirin EC 81 MG tablet Take 1 tablet (81 mg total) by mouth daily. 90 tablet 2  . atenolol (TENORMIN) 25 MG tablet Take 1 tablet (25 mg total) by mouth daily. 90 tablet 3  . cholecalciferol (VITAMIN D) 1000 UNITS tablet Take 1 tablet (1,000 Units total) by mouth daily. 30 tablet 11  . clindamycin (CLINDAGEL) 1 % gel Apply topically 2 (two) times daily. Boil on the thigh 30 g 0  . diclofenac sodium (VOLTAREN) 1 % GEL Apply 2 g topically 4 (four) times daily as needed. 100 g 4  . fish oil-omega-3 fatty acids 1000 MG capsule Take 1 g by mouth every 30 (thirty) days.     . fluticasone (FLONASE) 50 MCG/ACT nasal spray Place 2 sprays into both nostrils daily as needed for rhinitis. 16 g 2  . Hyprom-Naphaz-Polysorb-Zn Sulf (CLEAR EYES COMPLETE OP) Place 1 drop into both eyes daily as needed (dry eyes).     . Lidocaine-Prilocaine, Bulk, 2.5-2.5 % CREA Apply 5 mLs topically as needed (for port-a-cath use). 1 hour before use, cover with Band aid or plastic wrap to seal 30 g prn  . lisinopril-hydrochlorothiazide (PRINZIDE,ZESTORETIC) 20-25 MG tablet Take 1 tablet by mouth daily. 90 tablet 3  . loratadine (CLARITIN) 10 MG tablet Take 1 tablet (10 mg total) by mouth daily as needed for allergies. 30 tablet 3  . Multiple Vitamin (MULTIVITAMIN WITH MINERALS) TABS tablet Take 1 tablet by mouth daily.    [facility]nie [facility]  PROAIR HFA 108 (90 Base) MCG/ACT inhaler INHALE 2 PUFFS INTO THE LUNGS EVERY 4 HOURS AS NEEDED FOR WHEEZING OR SHORTNESS OF BREATH 1 Inhaler 0  . traMADol (ULTRAM) 50 MG tablet Take 1 tablet (50 mg total) by mouth every 6 (six) hours as needed for moderate pain or severe pain. 60 tablet 0  . VITAMIN E PO Take 1 tablet by mouth daily.      No current facility-administered medications for this visit.     REVIEW OF SYSTEMS:    10 Point review of Systems was done is negative except as noted above.  PHYSICAL EXAMINATION: ECOG PERFORMANCE STATUS: 1 - Symptomatic but completely ambulatory .BP (!) 148/56 (BP Location: Left Arm, Patient Position: Sitting)   Pulse (!) 56   Temp 97.5 F (36.4 C) (Oral)   Resp 20  Ht 5' 3"  (1.6 m)   Wt 168 lb 6.4 oz (76.4 kg)   SpO2 99%   BMI 29.83 kg/m    GENERAL:alert, in no acute distress and comfortable SKIN: skin color, texture, turgor are normal, no rashes or significant lesions EYES: normal, conjunctiva are pink and non-injected, sclera clear OROPHARYNX: Mucous membranes moist NECK: supple, no JVD, thyroid normal size, non-tender, without nodularity LYMPH:  no palpable lymphadenopathy in the cervical, axillary or inguinal LUNGS: clear to auscultation with normal respiratory effort HEART: regular rate & rhythm,  no murmurs and no lower extremity edema ABDOMEN: abdomen Soft with normoactive bowel sounds, No guarding rigidity or rebound. Musculoskeletal: no cyanosis of digits and no clubbing  PSYCH: alert & oriented x 3 with fluent speech NEURO: no focal motor/sensory deficits  LABORATORY DATA:  . CBC Latest Ref Rng & Units 07/30/2016 04/30/2016 01/19/2016  WBC 3.9 - 10.3 10e3/uL 7.7 6.0 6.7  Hemoglobin 11.6 - 15.9 g/dL 11.6 11.7 11.7  Hematocrit 34.8 - 46.6 % 36.4 36.5 36.8  Platelets 145 - 400 10e3/uL 141(L) 126(L) 169   . CBC    Component Value Date/Time   WBC 7.7 07/30/2016 0945   WBC 6.3 09/07/2015 0850   RBC 4.08 07/30/2016 0945    RBC 4.28 09/07/2015 0850   HGB 11.6 07/30/2016 0945   HCT 36.4 07/30/2016 0945   PLT 141 (L) 07/30/2016 0945   MCV 89.2 07/30/2016 0945   MCH 28.4 07/30/2016 0945   MCH 28.7 09/07/2015 0850   MCHC 31.9 07/30/2016 0945   MCHC 31.5 09/07/2015 0850   RDW 14.3 07/30/2016 0945   LYMPHSABS 1.9 07/30/2016 0945   MONOABS 0.8 07/30/2016 0945   EOSABS 0.1 07/30/2016 0945   BASOSABS 0.0 07/30/2016 0945   . CMP Latest Ref Rng & Units 08/05/2016 07/30/2016 07/03/2016  Glucose 65 - 99 mg/dL 82 106 87  BUN 8 - 27 mg/dL 12 13.3 17  Creatinine 0.57 - 1.00 mg/dL 0.62 0.7 0.72  Sodium 134 - 144 mmol/L 144 139 142  Potassium 3.5 - 5.2 mmol/L 5.0 4.0 5.1  Chloride 96 - 106 mmol/L 103 - 101  CO2 18 - 29 mmol/L 22 26 21   Calcium 8.7 - 10.3 mg/dL 9.6 9.2 9.4  Total Protein 6.4 - 8.3 g/dL - 6.9 7.3  Total Bilirubin 0.20 - 1.20 mg/dL - 0.37 0.2  Alkaline Phos 40 - 150 U/L - 119 115  AST 5 - 34 U/L - 14 19  ALT 0 - 55 U/L - 12 11    RADIOGRAPHIC STUDIES: I have personally reviewed the radiological images as listed and agreed with the findings in the report.  CT C/A/P 08/06/2016: IMPRESSION: 1. Stable examination demonstrating postoperative changes of left hemi hepatectomy, chronic thickening of the gastric antrum (potentially related to prior radiation therapy, and stable 7 mm pulmonary nodule in the superior segment of the right lower lobe. No findings to suggest local recurrence of disease or definite metastatic disease elsewhere in the chest, abdomen or pelvis. 2. Aortic atherosclerosis, in addition to 3 vessel coronary artery disease. Please note that although the presence of coronary artery calcium documents the presence of coronary artery disease, the severity of this disease and any potential stenosis cannot be assessed on this non-gated CT examination. Assessment for potential risk factor modification, dietary therapy or pharmacologic therapy may be warranted, if clinically indicated. 3.  Mild diffuse bronchial wall thickening with mild centrilobular emphysema ; imaging findings suggestive of underlying COPD. 4. Additional incidental findings, as above.  Electronically Signed   By: Vinnie Langton M.D.   On: 08/06/2016 11:11    ASSESSMENT & PLAN:   71 yo with multiple medical co-morbids  1) Intrahepatic invasive cholangiocarcinoma T2a N1 M0 (Stage IVA) with focal positive parenchymal margins and 1/2 LN positive. CT C/A/P on 04/03/2016 showed no evidence of cholangiocarcinoma local recurrence or metastases at this time. Patient  has completed her adjuvant concurrent chemo-radiation  on 05/25/2015 . She did overall quite well with treatment without any overt prohibitive toxicities. 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  Required treatment with high-dose steroids and dietary modifications.  Symptoms progressive to have resolved.  Currently on PPI for overt symptoms. CT chest abdomen pelvis in March 2017 showed no evidence of disease progression. Patient was seen at [facility] by Dr. Dennison Nancy and was given the option of observation versus adjuvant gemcitabine. She had a followup at [facility] with a CT Chest abdomen pelvis on 10/16/2015 which showed no evidence of recurrent or metastatic disease. Patient has completed 4 months of Adjuvant gemcitabine as per recommendation from [facility]. Rpt CT C/A/P  On 01/17/2016 -- shows no evidence of disease recurrence/progression. Rpt CT C/A/P  On 08/07/2015 -- shows no evidence of disease recurrence/progression.  Plan -No clinical or radiographic evidence of disease progression at this time  -we reviewed her CT chest/abd/pelvis results in details -unless any new concerns on imaging we shall plan to see her back in as per prveviously scheduled 3 months for continued followup. -She was encouraged to maintain good physical activity level and optimize oral intake.  2) Smoking and marijuana  use -counseled regarding smoking and drug use cessation  3) Mild thrombocytopenia-nearly resolved -will monitor.  4) rt inner thigh furuncle - resolved   F/u with labs as per her previously scheduled 3 month f/u  Sullivan Lone MD MS AAHIVMS [facility] Hematology/Oncology Physician [facility]  (Office):       [phone] (Work cell):  [phone] (Fax):           [phone]

## 2016-10-14 MED FILL — PREDNISOLONE AC 1% EYE DROP: 1 | 28 days supply | Qty: 5 | Fill #0

## 2016-10-14 MED FILL — PROLENSA 0.07% EYE DROPS: 0.07 | 20 days supply | Qty: 3 | Fill #0

## 2016-10-14 MED FILL — POLYMYXIN B/TMP EYE DROPS: 10000-0.1 | 7 days supply | Qty: 10 | Fill #0

## 2016-10-29 ENCOUNTER — Ambulatory Visit (HOSPITAL_BASED_OUTPATIENT_CLINIC_OR_DEPARTMENT_OTHER): Payer: Medicare HMO

## 2016-10-29 ENCOUNTER — Encounter: Payer: Self-pay | Admitting: Hematology

## 2016-10-29 ENCOUNTER — Other Ambulatory Visit (HOSPITAL_BASED_OUTPATIENT_CLINIC_OR_DEPARTMENT_OTHER): Payer: Medicare HMO

## 2016-10-29 ENCOUNTER — Ambulatory Visit (HOSPITAL_BASED_OUTPATIENT_CLINIC_OR_DEPARTMENT_OTHER): Payer: Medicare HMO | Admitting: Hematology

## 2016-10-29 ENCOUNTER — Telehealth: Payer: Self-pay | Admitting: Hematology

## 2016-10-29 VITALS — BP 167/59 | HR 58 | Temp 99.1°F | Resp 18 | Ht 63.0 in | Wt 171.7 lb

## 2016-10-29 DIAGNOSIS — Z95828 Presence of other vascular implants and grafts: Secondary | ICD-10-CM

## 2016-10-29 DIAGNOSIS — F17200 Nicotine dependence, unspecified, uncomplicated: Secondary | ICD-10-CM | POA: Diagnosis not present

## 2016-10-29 DIAGNOSIS — C221 Intrahepatic bile duct carcinoma: Secondary | ICD-10-CM

## 2016-10-29 LAB — COMPREHENSIVE METABOLIC PANEL
ALT: 15 U/L (ref 0–55)
AST: 16 U/L (ref 5–34)
Albumin: 3.3 g/dL — ABNORMAL LOW (ref 3.5–5.0)
Alkaline Phosphatase: 109 U/L (ref 40–150)
Anion Gap: 8 mEq/L (ref 3–11)
BUN: 16.5 mg/dL (ref 7.0–26.0)
CO2: 26 mEq/L (ref 22–29)
Calcium: 9.7 mg/dL (ref 8.4–10.4)
Chloride: 104 mEq/L (ref 98–109)
Creatinine: 0.8 mg/dL (ref 0.6–1.1)
EGFR: 84 mL/min/{1.73_m2} — ABNORMAL LOW (ref >90)
Glucose: 115 mg/dl (ref 70–140)
Potassium: 4.4 mEq/L (ref 3.5–5.1)
Sodium: 138 mEq/L (ref 136–145)
Total Bilirubin: 0.38 mg/dL (ref 0.20–1.20)
Total Protein: 7.1 g/dL (ref 6.4–8.3)

## 2016-10-29 LAB — CBC & DIFF AND RETIC
BASO%: 0.3 % (ref 0.0–2.0)
Basophils Absolute: 0 10*3/uL (ref 0.0–0.1)
EOS%: 1 % (ref 0.0–7.0)
Eosinophils Absolute: 0.1 10*3/uL (ref 0.0–0.5)
HCT: 39.7 % (ref 34.8–46.6)
HGB: 12.6 g/dL (ref 11.6–15.9)
Immature Retic Fract: 4.3 % (ref 1.60–10.00)
LYMPH%: 27.9 % (ref 14.0–49.7)
MCH: 28.8 pg (ref 25.1–34.0)
MCHC: 31.7 g/dL (ref 31.5–36.0)
MCV: 90.6 fL (ref 79.5–101.0)
MONO#: 0.6 10*3/uL (ref 0.1–0.9)
MONO%: 7.7 % (ref 0.0–14.0)
NEUT#: 4.6 10*3/uL (ref 1.5–6.5)
NEUT%: 63.1 % (ref 38.4–76.8)
Platelets: 150 10*3/uL (ref 145–400)
RBC: 4.38 10*6/uL (ref 3.70–5.45)
RDW: 13.4 % (ref 11.2–14.5)
Retic %: 1.65 % (ref 0.70–2.10)
Retic Ct Abs: 72.27 10*3/uL (ref 33.70–90.70)
WBC: 7.2 10*3/uL (ref 3.9–10.3)
lymph#: 2 10*3/uL (ref 0.9–3.3)

## 2016-10-29 MED ORDER — HEPARIN SOD (PORK) LOCK FLUSH 100 UNIT/ML IV SOLN
500.0000 [IU] | Freq: Once | INTRAVENOUS | Status: AC | PRN
  Administered 2016-10-29: 500 [IU] via INTRAVENOUS
  Filled 2016-10-29: qty 5

## 2016-10-29 MED ORDER — SODIUM CHLORIDE 0.9 % IJ SOLN
10.0000 mL | INTRAMUSCULAR | Status: DC | PRN
  Administered 2016-10-29: 10 mL via INTRAVENOUS
  Filled 2016-10-29: qty 10

## 2016-10-29 NOTE — Telephone Encounter (Signed)
Scheduled appt per 7/24 los - Gave patient AVS and calender per los.  

## 2016-10-29 NOTE — Patient Instructions (Signed)
Thank you for choosing [facility] to provide your oncology and hematology care.  To afford each patient quality time with our providers, please arrive 30 minutes before your scheduled appointment time.  If you arrive late for your appointment, you may be asked to reschedule.  We strive to give you quality time with our providers, and arriving late affects you and other patients whose appointments are after yours.  If you are a no show for multiple scheduled visits, you may be dismissed from the clinic at the providers discretion.   Again, thank you for choosing [facility], our hope is that these requests will decrease the amount of time that you wait before being seen by our physicians.  ______________________________________________________________________ Should you have questions after your visit to the  Cancer Center, please contact our office at [phone] between the hours of 8:30 and 4:30 p.m.    Voicemails left after 4:30p.m will not be returned until the following business day.   For prescription refill requests, please have your pharmacy contact us directly.  Please also try to allow 48 hours for prescription requests.   Please contact the scheduling department for questions regarding scheduling.  For scheduling of procedures such as PET scans, CT scans, MRI, Ultrasound, etc please contact central scheduling at [phone].   Resources For Cancer Patients and Caregivers:  American Cancer Society:  800-227-2345  Can help patients locate various types of support and financial assistance Cancer Care: 1-800-813-HOPE (4673) Provides financial assistance, online support groups, medication/co-pay assistance.   [place] DSS:  336-641-3447 Where to apply for food stamps, Medicaid, and utility assistance Medicare Rights Center: 800-333-4114 Helps people with Medicare understand their rights and benefits, navigate the Medicare system, and secure the  quality healthcare they deserve SCAT: 336-333-6589 [place] [employer]'s shared-ride transportation service for eligible riders who have a disability that prevents them from riding the fixed route bus.   For additional information on assistance programs please contact our social worker:   Grier Hock/Abigail Elmore:  [phone] 

## 2016-10-30 LAB — CANCER ANTIGEN 19-9: CA 19-9: 12 U/mL (ref 0–35)

## 2016-11-03 NOTE — Progress Notes (Addendum)
[name]    HEMATOLOGY/ONCOLOGY CLINIC NOTE  Date of Service: 10/29/2016    Patient Care Team: Bufford Lope, DO as PCP - General Melancon, York Ram, MD (Inactive) as Resident (Family Medicine)  CHIEF COMPLAINTS/PURPOSE OF CONSULTATION:   F/u for Cholangiocarcinoma   DIAGNOSIS  Stage IVA (T2a, pN1, M0) Intrahepatic Cholangiocarcinoma  Current Treatment  Active Surveillance  Previous treatment 02/09/15 L lobe of liver partial hepatectomy segments 2, 3, 4a, 4b invasive cholangiocarcinoma, moderately differentiated. Tumor focally extends to the parenchymal margin of resection. Uninvolved liver parenchyma with steatohepatitis and associated periportal and centrilobular pericellular fibrosis. Scattered von Meyenburg complexes. One hepatic artery LN with metastatic cholangiocarcinoma. Fibroadipose tissue and nerve negative for malignancy. Gallbladder negative for malignancy. One LN negative for malignancy. T2aN1 invasive cholangiocarcinoma, moderately differentiated, with positive hepatic parenchymal margin, and 1/2 nodes positive for tumor involvement.  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  Completed adjuvant gemcitabine x 4 months  Interval History  Ms. MCinqueis here for her scheduled followup. She notes that she has been doing well and has been gaining some weight. Eating well. No nausea no vomiting no abdominal pain no change in bowel habits. No chest pain no shortness of breath. No other acute new concerning symptoms. She is in good spirits. Has started smoking up to half to 1 pack of cigarettes daily and is also using marijuana regularly. We discussed the importance of smoking cessation and avoiding drug use and the patient knows that she will work on these.  MEDICAL HISTORY:  Past Medical  History:  Diagnosis Date  . Anxiety   . Benign positional vertigo   . Cancer ([facility] 02/09/15   intrahepatic cholangiocarcinoma  . COPD (chronic obstructive pulmonary disease) ([facility]   . Depression   . Diabetes mellitus without complication ([facility]   . Early cataracts, bilateral 10/13   Optho, Dr SGershon Crane . Echocardiogram findings abnormal, without diagnosis 10/10   10/10: mild pulm HTN, EF 60-65%, mild LVH, moderate aortic regurg  . GERD (gastroesophageal reflux disease)    I have "acid reflux"  . Gout   . HLD (hyperlipidemia)   . HTN, goal below 130/80   . Irritable bowel syndrome   . Obesity, Class III, BMI 40-49.9 (morbid obesity) ([facility]   . Obstructive sleep apnea    wears cpap  . Osteoarthritis (arthritis due to wear and tear of joints)    also gout  . Restrictive lung disease    SURGICAL HISTORY: Past Surgical History:  Procedure Laterality Date  . ABDOMINAL HYSTERECTOMY    . ESOPHAGOGASTRODUODENOSCOPY N/A 06/15/2015   Procedure: ESOPHAGOGASTRODUODENOSCOPY (EGD);  Surgeon: CGatha Mayer MD;  [facility];  Service: Endoscopy;  Laterality: N/A;  . LIVER BIOPSY    . OPEN PARTIAL HEPATECTOMY  Left 02/09/15  . PARTIAL HYSTERECTOMY    . ROTATOR CUFF REPAIR       L rotator cuff repair 11/07-[facility] - 12/7/200  . TONSILLECTOMY      SOCIAL HISTORY: Social History   Social History  . Marital status: Divorced    Spouse name: N/A  . Number of children: 4  . Years of education: N/A   Occupational History  . unemployed    Social History Main Topics  . Smoking status: Current Some Day Smoker    Packs/day: 1.00    Years: 49.00  Types: Cigarettes  . Smokeless tobacco: Never Used  . Alcohol use 0.0 oz/week     Comment: occasionally  . Drug use: Yes    Types: Marijuana  . Sexual activity: No   Other Topics Concern  . 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 Breast cancer Mother   . Hypertension Mother   . Coronary artery disease Mother   . Diabetes type II Mother   . Rheum arthritis Mother   . Diabetes type II Sister   . Pancreatic cancer Sister     ALLERGIES:  is allergic to other.  MEDICATIONS:  Current Outpatient Prescriptions  Medication Sig Dispense Refill  . alendronate (FOSAMAX) 70 MG tablet TAKE 1 TABLET  ONCE A WEEK. 12 tablet 2  . allopurinol (ZYLOPRIM) 100 MG tablet Take 1 tablet (100 mg total) by mouth daily. 90 tablet 0  . amLODipine (NORVASC) 10 MG tablet Take 1 tablet (10 mg total) by mouth daily. 90 tablet 0  . aspirin EC 81 MG tablet Take 1 tablet (81 mg total) by mouth daily. 90 tablet 2  . atenolol (TENORMIN) 25 MG tablet Take 1 tablet (25 mg total) by mouth daily. 90 tablet 3  . cholecalciferol (VITAMIN D) 1000 UNITS tablet Take 1 tablet (1,000 Units total) by mouth daily. 30 tablet 11  . clindamycin (CLINDAGEL) 1 % gel Apply topically 2 (two) times daily. Boil on the thigh 30 g 0  . diclofenac sodium (VOLTAREN) 1 % GEL Apply 2 g topically 4 (four) times daily as needed. 100 g 4  . fish oil-omega-3 fatty acids 1000 MG capsule Take 1 g by mouth every 30 (thirty) days.     . fluticasone (FLONASE) 50 MCG/ACT nasal spray Place 2 sprays into both nostrils daily as needed for rhinitis. 16 g 2  . Hyprom-Naphaz-Polysorb-Zn Sulf (CLEAR EYES COMPLETE OP) Place 1 drop into both eyes daily as needed (dry eyes).     . Lidocaine-Prilocaine, Bulk, 2.5-2.5 % CREA Apply 5 mLs topically as needed (for port-a-cath use). 1 hour before use, cover with Band aid or plastic wrap to seal 30 g prn  . lisinopril-hydrochlorothiazide (PRINZIDE,ZESTORETIC) 20-25 MG tablet Take 1  tablet by mouth daily. 90 tablet 3  . loratadine (CLARITIN) 10 MG tablet Take 1 tablet (10 mg total) by mouth daily as needed for allergies. 30 tablet 3  . Multiple Vitamin (MULTIVITAMIN WITH MINERALS) TABS tablet Take 1 tablet by mouth daily.    [name] PROAIR HFA 108 (90 Base) MCG/ACT inhaler INHALE 2 PUFFS INTO THE LUNGS EVERY 4 HOURS AS NEEDED FOR WHEEZING OR SHORTNESS OF BREATH 1 Inhaler 0  . traMADol (ULTRAM) 50 MG tablet Take 1 tablet (50 mg total) by mouth every 6 (six) hours as needed for moderate pain or severe pain. 60 tablet 0  . VITAMIN E PO Take 1 tablet by mouth daily.      No current facility-administered medications for this visit.     REVIEW OF SYSTEMS:    10 Point review of Systems was done is negative except  as noted above.  PHYSICAL EXAMINATION: ECOG PERFORMANCE STATUS: 1 - Symptomatic but completely ambulatory .BP (!) 167/59 (BP Location: Left Arm, Patient Position: Sitting)   Pulse (!) 58   Temp 99.1 F (37.3 C) (Oral)   Resp 18   Ht 5' 3"  (1.6 m)   Wt 171 lb 11.2 oz (77.9 kg)   SpO2 97%   BMI 30.42 kg/m    GENERAL:alert, in no acute distress and comfortable SKIN: skin color, texture, turgor are normal, no rashes or significant lesions EYES: normal, conjunctiva are pink and non-injected, sclera clear OROPHARYNX: Mucous membranes moist NECK: supple, no JVD, thyroid normal size, non-tender, without nodularity LYMPH:  no palpable lymphadenopathy in the cervical, axillary or inguinal LUNGS: clear to auscultation with normal respiratory effort HEART: regular rate & rhythm,  no murmurs and no lower extremity edema ABDOMEN: abdomen Soft with normoactive bowel sounds, No guarding rigidity or rebound. Musculoskeletal: no cyanosis of digits and no clubbing  PSYCH: alert & oriented x 3 with fluent speech NEURO: no focal motor/sensory deficits  LABORATORY DATA:  . CBC Latest Ref Rng & Units 10/29/2016 07/30/2016 04/30/2016  WBC 3.9 - 10.3 10e3/uL 7.2 7.7 6.0  Hemoglobin  11.6 - 15.9 g/dL 12.6 11.6 11.7  Hematocrit 34.8 - 46.6 % 39.7 36.4 36.5  Platelets 145 - 400 10e3/uL 150 141(L) 126(L)   . CBC    Component Value Date/Time   WBC 7.2 10/29/2016 1328   WBC 6.3 09/07/2015 0850   RBC 4.38 10/29/2016 1328   RBC 4.28 09/07/2015 0850   HGB 12.6 10/29/2016 1328   HCT 39.7 10/29/2016 1328   PLT 150 10/29/2016 1328   MCV 90.6 10/29/2016 1328   MCH 28.8 10/29/2016 1328   MCH 28.7 09/07/2015 0850   MCHC 31.7 10/29/2016 1328   MCHC 31.5 09/07/2015 0850   RDW 13.4 10/29/2016 1328   LYMPHSABS 2.0 10/29/2016 1328   MONOABS 0.6 10/29/2016 1328   EOSABS 0.1 10/29/2016 1328   BASOSABS 0.0 10/29/2016 1328   . CMP Latest Ref Rng & Units 10/29/2016 08/05/2016 07/30/2016  Glucose 70 - 140 mg/dl 115 82 106  BUN 7.0 - 26.0 mg/dL 16.5 12 13.3  Creatinine 0.6 - 1.1 mg/dL 0.8 0.62 0.7  Sodium 136 - 145 mEq/L 138 144 139  Potassium 3.5 - 5.1 mEq/L 4.4 5.0 4.0  Chloride 96 - 106 mmol/L - 103 -  CO2 22 - 29 mEq/L 26 22 26   Calcium 8.4 - 10.4 mg/dL 9.7 9.6 9.2  Total Protein 6.4 - 8.3 g/dL 7.1 - 6.9  Total Bilirubin 0.20 - 1.20 mg/dL 0.38 - 0.37  Alkaline Phos 40 - 150 U/L 109 - 119  AST 5 - 34 U/L 16 - 14  ALT 0 - 55 U/L 15 - 12    RADIOGRAPHIC STUDIES: I have personally reviewed the radiological images as listed and agreed with the findings in the report.  CT C/A/P 08/06/2016: IMPRESSION: 1. Stable examination demonstrating postoperative changes of left hemi hepatectomy, chronic thickening of the gastric antrum (potentially related to prior radiation therapy, and stable 7 mm pulmonary nodule in the superior segment of the right lower lobe. No findings to suggest local recurrence of disease or definite metastatic disease elsewhere in the chest, abdomen or pelvis. 2. Aortic atherosclerosis, in addition to 3 vessel coronary artery disease. Please note that although the presence of coronary artery calcium documents the presence of coronary artery disease,  the severity of this disease and any potential stenosis cannot be assessed on this  non-gated CT examination. Assessment for potential risk factor modification, dietary therapy or pharmacologic therapy may be warranted, if clinically indicated. 3. Mild diffuse bronchial wall thickening with mild centrilobular emphysema ; imaging findings suggestive of underlying COPD. 4. Additional incidental findings, as above.   Electronically Signed   By: Vinnie Langton M.D.   On: 08/06/2016 11:11    ASSESSMENT & PLAN:   71 yo with multiple medical co-morbids  1) Intrahepatic invasive cholangiocarcinoma T2a N1 M0 (Stage IVA) with focal positive parenchymal margins and 1/2 LN positive. CT C/A/P on 04/03/2016 showed no evidence of cholangiocarcinoma local recurrence or metastases at this time. Patient  has completed her adjuvant concurrent chemo-radiation  on 05/25/2015 . She did overall quite well with treatment without any overt prohibitive toxicities. 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  Required treatment with high-dose steroids and dietary modifications.  Symptoms progressive to have resolved.  Currently on PPI for overt symptoms. CT chest abdomen pelvis in March 2017 showed no evidence of disease progression. Patient was seen at [facility] by Dr. Dennison Nancy and was given the option of observation versus adjuvant gemcitabine. She had a followup at [facility] with a CT Chest abdomen pelvis on 10/16/2015 which showed no evidence of recurrent or metastatic disease. Patient has completed 4 months of Adjuvant gemcitabine as per recommendation from [facility]. Rpt CT C/A/P  On 01/17/2016 -- shows no evidence of disease recurrence/progression. Rpt CT C/A/P  On 08/07/2015 -- shows no evidence of disease recurrence/progression.   Plan -No clinical or lab evidence of disease progression at this time  -Port removal in 1-3 days CT chest abdomen pelvis in 10 weeks Return to  clinic with Dr. Irene Limbo in 3 months with labs and CT chest abdomen pelvis  2) Smoking and marijuana use -counseled regarding smoking and drug use cessation  3) Mild thrombocytopenia-nearly resolved -will monitor.  Port removal in 1-3 days CT chest abdomen pelvis in 10 weeks Return to clinic with Dr. Irene Limbo in 3 months with labs and CT chest abdomen pelvis  Sullivan Lone MD [place] AAHIVMS [facility] Hematology/Oncology Physician [facility]  (Office):       [phone] (Work cell):  [phone] (Fax):           [phone]

## 2016-11-07 ENCOUNTER — Other Ambulatory Visit: Payer: Self-pay | Admitting: Radiology

## 2016-11-08 ENCOUNTER — Other Ambulatory Visit: Payer: Self-pay | Admitting: Radiology

## 2016-11-11 ENCOUNTER — Ambulatory Visit (HOSPITAL_COMMUNITY)
Admission: RE | Admit: 2016-11-11 | Discharge: 2016-11-11 | Disposition: A | Discharge status: Home / Self Care | Payer: Medicare HMO | Source: Ambulatory Visit | Attending: Hematology | Admitting: Hematology

## 2016-11-11 ENCOUNTER — Encounter (HOSPITAL_COMMUNITY): Payer: Self-pay

## 2016-11-11 DIAGNOSIS — F17210 Nicotine dependence, cigarettes, uncomplicated: Secondary | ICD-10-CM | POA: Diagnosis not present

## 2016-11-11 DIAGNOSIS — E785 Hyperlipidemia, unspecified: Secondary | ICD-10-CM | POA: Diagnosis not present

## 2016-11-11 DIAGNOSIS — M109 Gout, unspecified: Secondary | ICD-10-CM | POA: Diagnosis not present

## 2016-11-11 DIAGNOSIS — Z7951 Long term (current) use of inhaled steroids: Secondary | ICD-10-CM | POA: Insufficient documentation

## 2016-11-11 DIAGNOSIS — F329 Major depressive disorder, single episode, unspecified: Secondary | ICD-10-CM | POA: Insufficient documentation

## 2016-11-11 DIAGNOSIS — G4733 Obstructive sleep apnea (adult) (pediatric): Secondary | ICD-10-CM | POA: Insufficient documentation

## 2016-11-11 DIAGNOSIS — I10 Essential (primary) hypertension: Secondary | ICD-10-CM | POA: Insufficient documentation

## 2016-11-11 DIAGNOSIS — K589 Irritable bowel syndrome without diarrhea: Secondary | ICD-10-CM | POA: Insufficient documentation

## 2016-11-11 DIAGNOSIS — H8110 Benign paroxysmal vertigo, unspecified ear: Secondary | ICD-10-CM | POA: Diagnosis not present

## 2016-11-11 DIAGNOSIS — E119 Type 2 diabetes mellitus without complications: Secondary | ICD-10-CM | POA: Diagnosis not present

## 2016-11-11 DIAGNOSIS — Z8589 Personal history of malignant neoplasm of other organs and systems: Secondary | ICD-10-CM | POA: Diagnosis not present

## 2016-11-11 DIAGNOSIS — I272 Pulmonary hypertension, unspecified: Secondary | ICD-10-CM | POA: Diagnosis not present

## 2016-11-11 DIAGNOSIS — K219 Gastro-esophageal reflux disease without esophagitis: Secondary | ICD-10-CM | POA: Diagnosis not present

## 2016-11-11 DIAGNOSIS — Z452 Encounter for adjustment and management of vascular access device: Secondary | ICD-10-CM | POA: Diagnosis present

## 2016-11-11 DIAGNOSIS — Z7982 Long term (current) use of aspirin: Secondary | ICD-10-CM | POA: Insufficient documentation

## 2016-11-11 DIAGNOSIS — F419 Anxiety disorder, unspecified: Secondary | ICD-10-CM | POA: Diagnosis not present

## 2016-11-11 DIAGNOSIS — Z6831 Body mass index (BMI) 31.0-31.9, adult: Secondary | ICD-10-CM | POA: Insufficient documentation

## 2016-11-11 DIAGNOSIS — J449 Chronic obstructive pulmonary disease, unspecified: Secondary | ICD-10-CM | POA: Diagnosis not present

## 2016-11-11 DIAGNOSIS — Z95828 Presence of other vascular implants and grafts: Secondary | ICD-10-CM

## 2016-11-11 DIAGNOSIS — M1990 Unspecified osteoarthritis, unspecified site: Secondary | ICD-10-CM | POA: Insufficient documentation

## 2016-11-11 HISTORY — PX: IR REMOVAL TUN ACCESS W/ PORT W/O FL MOD SED: IMG2290

## 2016-11-11 LAB — CBC WITH DIFFERENTIAL/PLATELET
Basophils Absolute: 0 10*3/uL (ref 0.0–0.1)
Basophils Relative: 0 %
Eosinophils Absolute: 0.1 10*3/uL (ref 0.0–0.7)
Eosinophils Relative: 1 %
HCT: 38.2 % (ref 36.0–46.0)
Hemoglobin: 12.5 g/dL (ref 12.0–15.0)
Lymphocytes Relative: 26 %
Lymphs Abs: 2 10*3/uL (ref 0.7–4.0)
MCH: 29.5 pg (ref 26.0–34.0)
MCHC: 32.7 g/dL (ref 30.0–36.0)
MCV: 90.1 fL (ref 78.0–100.0)
Monocytes Absolute: 0.6 10*3/uL (ref 0.1–1.0)
Monocytes Relative: 8 %
Neutro Abs: 4.9 10*3/uL (ref 1.7–7.7)
Neutrophils Relative %: 65 %
Platelets: 141 10*3/uL — ABNORMAL LOW (ref 150–400)
RBC: 4.24 MIL/uL (ref 3.87–5.11)
RDW: 13.9 % (ref 11.5–15.5)
WBC: 7.6 10*3/uL (ref 4.0–10.5)

## 2016-11-11 LAB — PROTIME-INR
INR: 0.99
Prothrombin Time: 13 seconds (ref 11.4–15.2)

## 2016-11-11 MED ORDER — FENTANYL CITRATE (PF) 100 MCG/2ML IJ SOLN
INTRAMUSCULAR | Status: AC
  Filled 2016-11-11: qty 4

## 2016-11-11 MED ORDER — SODIUM CHLORIDE 0.9 % IV SOLN
INTRAVENOUS | Status: DC
  Administered 2016-11-11: 11:00:00 via INTRAVENOUS

## 2016-11-11 MED ORDER — FLUMAZENIL 0.5 MG/5ML IV SOLN
INTRAVENOUS | Status: AC
  Filled 2016-11-11: qty 5

## 2016-11-11 MED ORDER — FENTANYL CITRATE (PF) 100 MCG/2ML IJ SOLN
INTRAMUSCULAR | Status: AC | PRN
  Administered 2016-11-11 (×2): 50 ug via INTRAVENOUS

## 2016-11-11 MED ORDER — MIDAZOLAM HCL 2 MG/2ML IJ SOLN
INTRAMUSCULAR | Status: AC
  Filled 2016-11-11: qty 4

## 2016-11-11 MED ORDER — NALOXONE HCL 0.4 MG/ML IJ SOLN
INTRAMUSCULAR | Status: AC
  Filled 2016-11-11: qty 1

## 2016-11-11 MED ORDER — MIDAZOLAM HCL 2 MG/2ML IJ SOLN
INTRAMUSCULAR | Status: AC | PRN
  Administered 2016-11-11 (×2): 1 mg via INTRAVENOUS

## 2016-11-11 MED ORDER — CEFAZOLIN SODIUM-DEXTROSE 2-4 GM/100ML-% IV SOLN
2.0000 g | INTRAVENOUS | Status: AC
  Administered 2016-11-11: 2 g via INTRAVENOUS

## 2016-11-11 MED ORDER — LIDOCAINE-EPINEPHRINE (PF) 2 %-1:200000 IJ SOLN
INTRAMUSCULAR | Status: AC
  Filled 2016-11-11: qty 20

## 2016-11-11 MED ORDER — CEFAZOLIN SODIUM-DEXTROSE 2-4 GM/100ML-% IV SOLN
INTRAVENOUS | Status: AC
  Filled 2016-11-11: qty 100

## 2016-11-11 NOTE — H&P (Signed)
Chief Complaint: Patient was seen in consultation today for cholangiocarcinoma  Referring Physician(s): Brunetta Genera  Supervising Physician: Sandi Mariscal  Patient Status: [facility] - Out-pt  History of Present Illness: Bonnie Peterson is a 71 y.o. female with past medical history of anxiety, COPD, DM, GERD, HLD, HTN, and  intrahepatic cholangiocarcinoma presents after completion of cancer treatment.  She has completed therapy without signs of disease progression and is no longer in need of Port-A-Cath which was placed 09/07/15 by Dr. Vernard Gambles.  IR consulted for Port-A-Cath removal at the request of Dr. Irene Limbo.   Patient presents for procedure today in her usual state of health.  She has been NPO.  She does not take blood thinners.   Past Medical History:  Diagnosis Date  . Anxiety   . Benign positional vertigo   . Cancer ([place]) 02/09/15   intrahepatic cholangiocarcinoma  . COPD (chronic obstructive pulmonary disease) ([address])   . Depression   . Diabetes mellitus without complication ([place])   . Early cataracts, bilateral 10/13   Optho, Dr Gershon Crane  . Echocardiogram findings abnormal, without diagnosis 10/10   10/10: mild pulm HTN, EF 60-65%, mild LVH, moderate aortic regurg  . GERD (gastroesophageal reflux disease)    I have "acid reflux"  . Gout   . HLD (hyperlipidemia)   . HTN, goal below 130/80   . Irritable bowel syndrome   . Obesity, Class III, BMI 40-49.9 (morbid obesity) ([place])   . Obstructive sleep apnea    wears cpap  . Osteoarthritis (arthritis due to wear and tear of joints)    also gout  . Restrictive lung disease     Past Surgical History:  Procedure Laterality Date  . ABDOMINAL HYSTERECTOMY    . ESOPHAGOGASTRODUODENOSCOPY N/A 06/15/2015   Procedure: ESOPHAGOGASTRODUODENOSCOPY (EGD);  Surgeon: Gatha Mayer, MD;  [facility];  Service: Endoscopy;  Laterality: N/A;  . LIVER BIOPSY    . OPEN PARTIAL HEPATECTOMY  Left 02/09/15  . PARTIAL HYSTERECTOMY      . ROTATOR CUFF REPAIR       L rotator cuff repair 11/07-[facility] - 12/7/200  . TONSILLECTOMY      Allergies: Other  Medications: Prior to Admission medications   Medication Sig Start Date End Date Taking? Authorizing Provider  allopurinol (ZYLOPRIM) 100 MG tablet Take 1 tablet (100 mg total) by mouth daily. 08/19/16  Yes Gottschalk, Leatrice Jewels M, DO  amLODipine (NORVASC) 10 MG tablet Take 1 tablet (10 mg total) by mouth daily. 08/20/16  Yes Gottschalk, Leatrice Jewels M, DO  atenolol (TENORMIN) 25 MG tablet Take 1 tablet (25 mg total) by mouth daily. 09/17/16  Yes Haney, Alyssa A, MD  cholecalciferol (VITAMIN D) 1000 UNITS tablet Take 1 tablet (1,000 Units total) by mouth daily. 08/15/14  Yes Melancon, York Ram, MD  clindamycin (CLINDAGEL) 1 % gel Apply topically 2 (two) times daily. Boil on the thigh 07/30/16  Yes Brunetta Genera, MD  diclofenac sodium (VOLTAREN) 1 % GEL Apply 2 g topically 4 (four) times daily as needed. 08/05/16  Yes Haney, Alyssa A, MD  fish oil-omega-3 fatty acids 1000 MG capsule Take 1 g by mouth every 30 (thirty) days.    Yes [provider]  fluticasone (FLONASE) 50 MCG/ACT nasal spray Place 2 sprays into both nostrils daily as needed for rhinitis. 04/17/16  Yes Haney, Alyssa A, MD  Hyprom-Naphaz-Polysorb-Zn Sulf (CLEAR EYES COMPLETE OP) Place 1 drop into both eyes daily as needed (dry eyes).    Yes [provider]  lisinopril-hydrochlorothiazide (PRINZIDE,ZESTORETIC) 20-25 MG tablet Take 1 tablet by mouth daily. 07/03/16  Yes Haney, Alyssa A, MD  loratadine (CLARITIN) 10 MG tablet Take 1 tablet (10 mg total) by mouth daily as needed for allergies. 09/23/12  Yes Waldemar Dickens, MD  Multiple Vitamin (MULTIVITAMIN WITH MINERALS) TABS tablet Take 1 tablet by mouth daily.   Yes [provider]  VITAMIN E PO Take 1 tablet by mouth daily.    Yes [provider]  alendronate (FOSAMAX) 70 MG tablet TAKE 1 TABLET  ONCE A WEEK. 06/25/16   Haney, Alyssa A, MD   aspirin EC 81 MG tablet Take 1 tablet (81 mg total) by mouth daily. 01/23/16 01/22/17  Haney, Yetta Flock A, MD  Lidocaine-Prilocaine, Bulk, 2.5-2.5 % CREA Apply 5 mLs topically as needed (for port-a-cath use). 1 hour before use, cover with Band aid or plastic wrap to seal 09/07/15   Brunetta Genera, MD  PROAIR HFA 108 [id number] Base) MCG/ACT inhaler INHALE 2 PUFFS INTO THE LUNGS EVERY 4 HOURS AS NEEDED FOR WHEEZING OR SHORTNESS OF BREATH 07/18/15   Melancon, York Ram, MD  traMADol (ULTRAM) 50 MG tablet Take 1 tablet (50 mg total) by mouth every 6 (six) hours as needed for moderate pain or severe pain. 08/28/16   Veatrice Bourbon, MD     Family History  Problem Relation Age of Onset  . Breast cancer Mother   . Hypertension Mother   . Coronary artery disease Mother   . Diabetes type II Mother   . Rheum arthritis Mother   . Diabetes type II Sister   . Pancreatic cancer Sister     Social History   Social History  . Marital status: Divorced    Spouse name: N/A  . Number of children: 4  . Years of education: N/A   Occupational History  . unemployed    Social History Main Topics  . Smoking status: Current Some Day Smoker    Packs/day: 1.00    Years: 49.00    Types: Cigarettes  . Smokeless tobacco: Never Used  . Alcohol use 0.0 oz/week     Comment: occasionally  . Drug use: Yes    Types: Marijuana  . Sexual activity: No   Other Topics Concern  . 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  Respiratory: Negative for cough and shortness of breath.   Cardiovascular: Negative for chest pain.  Gastrointestinal: Negative for  abdominal pain.  Psychiatric/Behavioral: Negative for behavioral problems and confusion.    Vital Signs: BP (!) 174/71   Pulse 64   Temp (!) 97.5 F (36.4 C) (Oral)   Resp 16   Ht 5\' 3"  (1.6 m)   Wt 175 lb (79.4 kg)   SpO2 99%   BMI 31.00 kg/m   Physical Exam  Constitutional: She is oriented to person, place, and time. She appears well-developed.  Cardiovascular: Normal rate, regular rhythm and normal heart sounds.   Pulmonary/Chest: Effort normal and breath sounds normal. No respiratory distress.  Abdominal: Soft.  Neurological: She is alert and  oriented to person, place, and time.  Skin: Skin is warm and dry.  Psychiatric: She has a normal mood and affect. Her behavior is normal. Judgment and thought content normal.  Nursing note and vitals reviewed.   Mallampati Score:  MD Evaluation Airway: WNL Heart: WNL Abdomen: WNL Chest/ Lungs: WNL ASA  Classification: 3 Mallampati/Airway Score: Two  Imaging: No results found.  Labs:  CBC:  Recent Labs  04/30/16 0923 07/30/16 0945 10/29/16 1328 11/11/16 1006  WBC 6.0 7.7 7.2 7.6  HGB 11.7 11.6 12.6 12.5  HCT 36.5 36.4 39.7 38.2  PLT 126* 141* 150 141*    COAGS:  Recent Labs  11/11/16 1006  INR 0.99    BMP:  Recent Labs  06/19/16 1432 07/03/16 1404 07/30/16 0945 08/05/16 1438 10/29/16 1328  NA 139 142 139 144 138  K 4.4 5.1 4.0 5.0 4.4  CL 104 101  --  103  --   CO2 28 21 26 22 26   GLUCOSE 79 87 106 82 115  BUN 22 17 13.3 12 16.5  CALCIUM 9.1 9.4 9.2 9.6 9.7  CREATININE 0.66 0.72 0.7 0.62 0.8  GFRNONAA >89 86  --  92  --   GFRAA >89 99  --  106  --     LIVER FUNCTION TESTS:  Recent Labs  06/19/16 1432 07/03/16 1404 07/30/16 0945 10/29/16 1328  BILITOT 0.3 0.2 0.37 0.38  AST 15 19 14 16   ALT 10 11 12 15   ALKPHOS 96 115 119 109  PROT 6.6 7.3 6.9 7.1  ALBUMIN 3.5* 4.0 3.3* 3.3*    TUMOR MARKERS: No results for input(s): AFPTM, CEA, CA199, CHROMGRNA in the last 8760  hours.  Assessment and Plan: Patient with past medical history of cholangiocarcinoma presents at the completion of her cancer treatment without signs of disease progression and recurrence.  IR consulted for Port-A-Cath removal at the request of Dr. Irene Limbo. Patient presents today in their usual state of health.  She has been NPO and is not currently on blood thinners.  Risks and benefits discussed with the patient including, but not limited to bleeding, infection. All of the patient's questions were answered, patient is agreeable to proceed. Consent signed and in chart.  Thank you for this interesting consult.  I greatly enjoyed meeting Bonnie Peterson and look forward to participating in their care.  A copy of this report was sent to the requesting provider on this date.  Electronically Signed: Docia Barrier, PA 11/11/2016, 11:18 AM   I spent a total of  30 Minutes   in face to face in clinical consultation, greater than 50% of which was counseling/coordinating care for cholangiocarcinoma.

## 2016-11-11 NOTE — Discharge Instructions (Signed)
Call MD if redness, drainage, or swelling at incision. May remove bandage in 24 hours and shower. Call for fever or increased pain.      Implanted Port Removal, Care After Refer to this sheet in the next few weeks. These instructions provide you with information about caring for yourself after your procedure. Your health care provider may also give you more specific instructions. Your treatment has been planned according to current medical practices, but problems sometimes occur. Call your health care provider if you have any problems or questions after your procedure. What can I expect after the procedure? After the procedure, it is common to have:  Soreness or pain near your incision.  Some swelling or bruising near your incision.  Follow these instructions at home: Medicines  Take over-the-counter and prescription medicines only as told by your health care provider.  If you were prescribed an antibiotic medicine, take it as told by your health care provider. Do not stop taking the antibiotic even if you start to feel better. Bathing  Do not take baths, swim, or use a hot tub until your health care provider approves. Ask your health care provider if you can take showers. You may only be allowed to take sponge baths for bathing. Incision care  Follow instructions from your health care provider about how to take care of your incision. Make sure you: ? Wash your hands with soap and water before you change your bandage (dressing). If soap and water are not available, use hand sanitizer. ? Change your dressing as told by your health care provider. ? Keep your dressing dry. ? Leave stitches (sutures), skin glue, or adhesive strips in place. These skin closures may need to stay in place for 2 weeks or longer. If adhesive strip edges start to loosen and curl up, you may trim the loose edges. Do not remove adhesive strips completely unless your health care provider tells you to do  that.  Check your incision area every day for signs of infection. Check for: ? More redness, swelling, or pain. ? More fluid or blood. ? Warmth. ? Pus or a bad smell. Driving  If you received a sedative, do not drive for 24 hours after the procedure.  If you did not receive a sedative, ask your health care provider when it is safe to drive. Activity  Return to your normal activities as told by your health care provider. Ask your health care provider what activities are safe for you.  Until your health care provider says it is safe: ? Do not lift anything that is heavier than 10 lb (4.5 kg). ? Do not do activities that involve lifting your arms over your head. General instructions  Do not use any tobacco products, such as cigarettes, chewing tobacco, and e-cigarettes. Tobacco can delay healing. If you need help quitting, ask your health care provider.  Keep all follow-up visits as told by your health care provider. This is important. Contact a health care provider if:  You have more redness, swelling, or pain around your incision.  You have more fluid or blood coming from your incision.  Your incision feels warm to the touch.  You have pus or a bad smell coming from your incision.  You have a fever.  You have pain that is not relieved by your pain medicine. Get help right away if:  You have chest pain.  You have difficulty breathing. This information is not intended to replace advice given to you by  your health care provider. Make sure you discuss any questions you have with your health care provider. Document Released: 03/06/2015 Document Revised: 08/31/2015 Document Reviewed: 12/28/2014 Elsevier Interactive Patient Education  2018 [place].    Moderate Conscious Sedation, Adult, Care After These instructions provide you with information about caring for yourself after your procedure. Your health care provider may also give you more specific instructions. Your  treatment has been planned according to current medical practices, but problems sometimes occur. Call your health care provider if you have any problems or questions after your procedure. What can I expect after the procedure? After your procedure, it is common:  To feel sleepy for several hours.  To feel clumsy and have poor balance for several hours.  To have poor judgment for several hours.  To vomit if you eat too soon.  Follow these instructions at home: For at least 24 hours after the procedure:   Do not: ? Participate in activities where you could fall or become injured. ? Drive. ? Use heavy machinery. ? Drink alcohol. ? Take sleeping pills or medicines that cause drowsiness. ? Make important decisions or sign legal documents. ? Take care of children on your own.  Rest. Eating and drinking  Follow the diet recommended by your health care provider.  If you vomit: ? Drink water, juice, or soup when you can drink without vomiting. ? Make sure you have little or no nausea before eating solid foods. General instructions  Have a responsible adult stay with you until you are awake and alert.  Take over-the-counter and prescription medicines only as told by your health care provider.  If you smoke, do not smoke without supervision.  Keep all follow-up visits as told by your health care provider. This is important. Contact a health care provider if:  You keep feeling nauseous or you keep vomiting.  You feel light-headed.  You develop a rash.  You have a fever. Get help right away if:  You have trouble breathing. This information is not intended to replace advice given to you by your health care provider. Make sure you discuss any questions you have with your health care provider. Document Released: 01/13/2013 Document Revised: 08/28/2015 Document Reviewed: 07/15/2015 Elsevier Interactive Patient Education  [employer].

## 2016-11-11 NOTE — Procedures (Signed)
Pre procedural Dx: Poor venous access Post procedural Dx: Same  Successful removal of right anterior chest wall port-a-cath. EBL: Minimal  No immediate post procedural complications.   Ronny Bacon, MD Pager #: [phone]

## 2016-12-24 LAB — HM DIABETES EYE EXAM

## 2017-01-03 ENCOUNTER — Encounter: Payer: Self-pay | Admitting: Family Medicine

## 2017-01-03 ENCOUNTER — Ambulatory Visit (INDEPENDENT_AMBULATORY_CARE_PROVIDER_SITE_OTHER): Payer: Medicare HMO | Admitting: Family Medicine

## 2017-01-03 VITALS — BP 126/74 | HR 61 | Temp 97.9°F | Ht 63.0 in | Wt 181.0 lb

## 2017-01-03 DIAGNOSIS — M1A9XX0 Chronic gout, unspecified, without tophus (tophi): Secondary | ICD-10-CM | POA: Diagnosis not present

## 2017-01-03 DIAGNOSIS — Z1231 Encounter for screening mammogram for malignant neoplasm of breast: Secondary | ICD-10-CM

## 2017-01-03 DIAGNOSIS — E119 Type 2 diabetes mellitus without complications: Secondary | ICD-10-CM | POA: Diagnosis not present

## 2017-01-03 DIAGNOSIS — Z23 Encounter for immunization: Secondary | ICD-10-CM

## 2017-01-03 DIAGNOSIS — Z1239 Encounter for other screening for malignant neoplasm of breast: Secondary | ICD-10-CM

## 2017-01-03 DIAGNOSIS — M810 Age-related osteoporosis without current pathological fracture: Secondary | ICD-10-CM | POA: Diagnosis not present

## 2017-01-03 DIAGNOSIS — I10 Essential (primary) hypertension: Secondary | ICD-10-CM | POA: Diagnosis not present

## 2017-01-03 LAB — POCT GLYCOSYLATED HEMOGLOBIN (HGB A1C): Hemoglobin A1C: 6.4

## 2017-01-03 MED ORDER — ALENDRONATE SODIUM 70 MG PO TABS: ORAL_TABLET | ORAL | 2 refills | Status: DC

## 2017-01-03 MED ORDER — ATENOLOL 25 MG PO TABS: 25.0000 mg | ORAL_TABLET | Freq: Every day | ORAL | 3 refills | Status: DC

## 2017-01-03 MED ORDER — VITAMIN D3 25 MCG (1000 UNIT) PO TABS: 1000.0000 [IU] | ORAL_TABLET | Freq: Every day | ORAL | 11 refills | Status: DC

## 2017-01-03 MED ORDER — LISINOPRIL-HYDROCHLOROTHIAZIDE 20-25 MG PO TABS: 1.0000 | ORAL_TABLET | Freq: Every day | ORAL | 3 refills | Status: DC

## 2017-01-03 MED ORDER — ALLOPURINOL 100 MG PO TABS: 100.0000 mg | ORAL_TABLET | Freq: Every day | ORAL | 0 refills | Status: DC

## 2017-01-03 MED ORDER — ASPIRIN EC 81 MG PO TBEC: 81.0000 mg | DELAYED_RELEASE_TABLET | Freq: Every day | ORAL | 2 refills | Status: AC

## 2017-01-03 MED ORDER — AMLODIPINE BESYLATE 10 MG PO TABS: 10.0000 mg | ORAL_TABLET | Freq: Every day | ORAL | 1 refills | Status: DC

## 2017-01-03 NOTE — Patient Instructions (Signed)
It was good to see you today!  For your prediabetes, - We talked about getting more exercise and giving up sweets and eating more fruits/vegetables today.   Please check-out at the front desk before leaving the clinic. Make an appointment in  3 months to follow up on prediabetes and cholesterol  Please bring all of your medications with you to each visit.   Sign up for My Chart to have easy access to your labs results, and communication with your primary care physician.  Feel free to call with any questions or concerns at any time, at [phone].   Take care,  Dr. Bufford Lope, [place]

## 2017-01-03 NOTE — Progress Notes (Signed)
    Subjective:  Bonnie Peterson is a 71 y.o. female who presents to the [facility] today with a chief complaint of diabetes and HTN follow up.   HPI: HTN - does not monitor BP at home - medications: norvasc 10mg  qd, atenolol 25mg  qd, lisinopril-hctz 20-25 qd - missed dose only about once weekly, usually good compliance - HTN ROS: taking medications as instructed, no medication side effects noted, patient does not perform home BP monitoring, no TIA's, no chest pain on exertion, no dyspnea on exertion and no swelling of ankles.    Diabetes - diet controlled - no polydipsia - no vision changes, last saw eye doctor 12/24/16 24-hr recall  (Up at 6:30 AM) B (8 AM)- eggs, [place] sausage, 1 slice toast sometimes have oatmeal or grits L (03-1229 PM) - sometimes sandwich or leftovers. Peanut butter and jelly sandwich wheat bread D (630 PM)- fried chicken, brown rice, black eyed peas   Snk ( 930PM)- ice cream  Typical day? Yes.     Osteoporosis  - Needs refills of alendronate and vit d today - last DEXA was years ago, amenable to repeat since plans on going for screening mammogram soon - no falls or broken bones recently    ROS: Per HPI  PMH: Smoking history reviewed.   she reports that she has been smoking Cigarettes and is not yet ready to quit.  Objective:  Physical Exam: BP 126/74   Pulse 61   Temp 97.9 F (36.6 C) (Oral)   Ht 5\' 3"  (1.6 m)   Wt 181 lb (82.1 kg)   SpO2 95%   BMI 32.06 kg/m   Gen: NAD, resting comfortably CV: RRR with no murmurs appreciated Pulm: NWOB, CTAB with no crackles, wheezes, or rhonchi GI: Normal bowel sounds present. Soft, Nontender, Nondistended. MSK: no edema, cyanosis, or clubbing noted Skin: warm, dry Neuro: grossly normal, moves all extremities Psych: Normal affect and thought content  Results for orders placed or performed in visit on 01/03/17 (from the past 72 hour(s))  HgB A1c     Status: None   Collection Time: 01/03/17 10:35 AM    Result Value Ref Range   Hemoglobin A1C 6.4      Assessment/Plan:  Type 2 diabetes mellitus without complication, without long-term current use of insulin (HCC) Diet controlled. A1c increased to 6.4 today from 5.15 November 2015, prior to that she had been >7 so discussed at length with patient today about health diet and exercise as her goal is to stay off medications. Patient decided to switch out sweets like ice cream for fruits and sometimes changes her lunch sandwich to salad. She also plans to start exercising more. Follow up in 3 months.  HYPERTENSION, BENIGN SYSTEMIC Controlled and stable. Continue current medication regimen of norvasc 10mg  qd, atenolol 25mg  qd, lisinopril-hctz 20-25 qd.  Osteoporosis Stable. Patient is doing well on alendronate and vitD. Last DEXA was in 2014 so since >2 years ago is due for repeat. Ordered today.    Bufford Lope, DO PGY-2, [facility] 01/03/2017 11:07 AM

## 2017-01-05 DIAGNOSIS — E119 Type 2 diabetes mellitus without complications: Secondary | ICD-10-CM | POA: Insufficient documentation

## 2017-01-05 NOTE — Assessment & Plan Note (Signed)
Controlled and stable. Continue current medication regimen of norvasc 10mg  qd, atenolol 25mg  qd, lisinopril-hctz 20-25 qd.

## 2017-01-05 NOTE — Assessment & Plan Note (Signed)
Stable. Patient is doing well on alendronate and vitD. Last DEXA was in 2014 so since >2 years ago is due for repeat. Ordered today.

## 2017-01-05 NOTE — Assessment & Plan Note (Signed)
Diet controlled. A1c increased to 6.4 today from 5.15 November 2015, prior to that she had been >7 so discussed at length with patient today about health diet and exercise as her goal is to stay off medications. Patient decided to switch out sweets like ice cream for fruits and sometimes changes her lunch sandwich to salad. She also plans to start exercising more. Follow up in 3 months.

## 2017-01-16 ENCOUNTER — Other Ambulatory Visit: Payer: Self-pay

## 2017-01-16 DIAGNOSIS — C221 Intrahepatic bile duct carcinoma: Secondary | ICD-10-CM

## 2017-01-17 ENCOUNTER — Telehealth: Payer: Self-pay | Admitting: Hematology

## 2017-01-17 NOTE — Telephone Encounter (Signed)
Left voicemail for patient regarding appts that were scheduled per 10/11 sch msg.

## 2017-01-20 ENCOUNTER — Other Ambulatory Visit: Payer: Self-pay | Admitting: *Deleted

## 2017-01-20 ENCOUNTER — Other Ambulatory Visit (HOSPITAL_BASED_OUTPATIENT_CLINIC_OR_DEPARTMENT_OTHER): Payer: Medicare HMO

## 2017-01-20 ENCOUNTER — Encounter (HOSPITAL_COMMUNITY): Payer: Self-pay

## 2017-01-20 ENCOUNTER — Ambulatory Visit (HOSPITAL_COMMUNITY)
Admission: RE | Admit: 2017-01-20 | Discharge: 2017-01-20 | Disposition: A | Discharge status: Home / Self Care | Payer: Medicare HMO | Source: Ambulatory Visit | Attending: Hematology | Admitting: Hematology

## 2017-01-20 DIAGNOSIS — J439 Emphysema, unspecified: Secondary | ICD-10-CM | POA: Insufficient documentation

## 2017-01-20 DIAGNOSIS — I700 Atherosclerosis of aorta: Secondary | ICD-10-CM | POA: Insufficient documentation

## 2017-01-20 DIAGNOSIS — I2510 Atherosclerotic heart disease of native coronary artery without angina pectoris: Secondary | ICD-10-CM | POA: Insufficient documentation

## 2017-01-20 DIAGNOSIS — C221 Intrahepatic bile duct carcinoma: Secondary | ICD-10-CM | POA: Insufficient documentation

## 2017-01-20 LAB — BUN AND CREATININE (CC13)
BUN: 20.1 mg/dL (ref 7.0–26.0)
Creatinine: 0.8 mg/dL (ref 0.6–1.1)
EGFR: >60 mL/min/{1.73_m2} (ref >60)

## 2017-01-20 MED ORDER — IOPAMIDOL (ISOVUE-300) INJECTION 61%
INTRAVENOUS | Status: AC
  Filled 2017-01-20: qty 100

## 2017-01-20 MED ORDER — IOPAMIDOL (ISOVUE-300) INJECTION 61%
100.0000 mL | Freq: Once | INTRAVENOUS | Status: AC | PRN
  Administered 2017-01-20: 100 mL via INTRAVENOUS

## 2017-01-29 ENCOUNTER — Encounter: Payer: Self-pay | Admitting: Family Medicine

## 2017-01-29 ENCOUNTER — Other Ambulatory Visit (HOSPITAL_BASED_OUTPATIENT_CLINIC_OR_DEPARTMENT_OTHER): Payer: Medicare HMO

## 2017-01-29 ENCOUNTER — Ambulatory Visit (HOSPITAL_BASED_OUTPATIENT_CLINIC_OR_DEPARTMENT_OTHER): Payer: Medicare HMO | Admitting: Hematology

## 2017-01-29 VITALS — BP 155/56 | HR 60 | Temp 98.0°F | Resp 17 | Ht 63.0 in | Wt 185.3 lb

## 2017-01-29 DIAGNOSIS — C221 Intrahepatic bile duct carcinoma: Secondary | ICD-10-CM

## 2017-01-29 DIAGNOSIS — F1290 Cannabis use, unspecified, uncomplicated: Secondary | ICD-10-CM

## 2017-01-29 LAB — CBC & DIFF AND RETIC
BASO%: 0.2 % (ref 0.0–2.0)
Basophils Absolute: 0 10*3/uL (ref 0.0–0.1)
EOS%: 1.1 % (ref 0.0–7.0)
Eosinophils Absolute: 0.1 10*3/uL (ref 0.0–0.5)
HCT: 38.6 % (ref 34.8–46.6)
HGB: 12 g/dL (ref 11.6–15.9)
Immature Retic Fract: 6.3 % (ref 1.60–10.00)
LYMPH%: 26.6 % (ref 14.0–49.7)
MCH: 28.9 pg (ref 25.1–34.0)
MCHC: 31.1 g/dL — ABNORMAL LOW (ref 31.5–36.0)
MCV: 93 fL (ref 79.5–101.0)
MONO#: 0.7 10*3/uL (ref 0.1–0.9)
MONO%: 8.5 % (ref 0.0–14.0)
NEUT#: 5.2 10*3/uL (ref 1.5–6.5)
NEUT%: 63.6 % (ref 38.4–76.8)
Platelets: 154 10*3/uL (ref 145–400)
RBC: 4.15 10*6/uL (ref 3.70–5.45)
RDW: 14 % (ref 11.2–14.5)
Retic %: 2.15 % — ABNORMAL HIGH (ref 0.70–2.10)
Retic Ct Abs: 89.23 10*3/uL (ref 33.70–90.70)
WBC: 8.2 10*3/uL (ref 3.9–10.3)
lymph#: 2.2 10*3/uL (ref 0.9–3.3)

## 2017-01-29 LAB — COMPREHENSIVE METABOLIC PANEL
ALT: 19 U/L (ref 0–55)
AST: 22 U/L (ref 5–34)
Albumin: 3.3 g/dL — ABNORMAL LOW (ref 3.5–5.0)
Alkaline Phosphatase: 109 U/L (ref 40–150)
Anion Gap: 8 mEq/L (ref 3–11)
BUN: 15.4 mg/dL (ref 7.0–26.0)
CO2: 29 mEq/L (ref 22–29)
Calcium: 9.8 mg/dL (ref 8.4–10.4)
Chloride: 103 mEq/L (ref 98–109)
Creatinine: 0.8 mg/dL (ref 0.6–1.1)
EGFR: >60 mL/min/{1.73_m2} (ref >60)
Glucose: 144 mg/dl — ABNORMAL HIGH (ref 70–140)
Potassium: 4.1 mEq/L (ref 3.5–5.1)
Sodium: 140 mEq/L (ref 136–145)
Total Bilirubin: 0.28 mg/dL (ref 0.20–1.20)
Total Protein: 7.5 g/dL (ref 6.4–8.3)

## 2017-01-29 NOTE — Progress Notes (Signed)
Bonnie Peterson    HEMATOLOGY/ONCOLOGY CLINIC NOTE  Date of Service: 01/29/17     Patient Care Team: Bufford Lope, DO as PCP - General [place], York Ram, MD (Inactive) as Resident (Family Medicine)  CHIEF COMPLAINTS/PURPOSE OF CONSULTATION:   F/u for Cholangiocarcinoma   DIAGNOSIS  Stage IVA (T2a, pN1, M0) Intrahepatic Cholangiocarcinoma  Current Treatment  Active Surveillance  Previous treatment 02/09/15 L lobe of liver partial hepatectomy segments 2, 3, 4a, 4b invasive cholangiocarcinoma, moderately differentiated. Tumor focally extends to the parenchymal margin of resection. Uninvolved liver parenchyma with steatohepatitis and associated periportal and centrilobular pericellular fibrosis. Scattered von Meyenburg complexes. One hepatic artery LN with metastatic cholangiocarcinoma. Fibroadipose tissue and nerve negative for malignancy. Gallbladder negative for malignancy. One LN negative for malignancy. T2aN1 invasive cholangiocarcinoma, moderately differentiated, with positive hepatic parenchymal margin, and 1/2 nodes positive for tumor involvement.  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  Completed adjuvant gemcitabine x 4 months  Interval History  Bonnie Peterson here for her scheduled followup. She reports that she is doing well overall. Her scans and tests are stable and show no evidence of recurrence or metastatic disease. She states she is not using marijuana anymore but is still smoking cigarettes daily.  Has been eating well and has been gain weight.  On review of systems pt reports weight gain, and denies nausea, vomiting, changes in BM, fever, chills and any other accompanying symptoms.     MEDICAL HISTORY:  Past Medical History:  Diagnosis Date  . Anxiety   . Benign positional vertigo     . Cancer ([facility] 02/09/15   intrahepatic cholangiocarcinoma  . COPD (chronic obstructive pulmonary disease) ([facility]   . Depression   . Diabetes mellitus without complication ([facility]   . Early cataracts, bilateral 10/13   Optho, Dr SGershon Crane . Echocardiogram findings abnormal, without diagnosis 10/10   10/10: mild pulm HTN, EF 60-65%, mild LVH, moderate aortic regurg  . GERD (gastroesophageal reflux disease)    I have "acid reflux"  . Gout   . HLD (hyperlipidemia)   . HTN, goal below 130/80   . Irritable bowel syndrome   . Obesity, Class III, BMI 40-49.9 (morbid obesity) ([facility]   . Obstructive sleep apnea    wears cpap  . Osteoarthritis (arthritis due to wear and tear of joints)    also gout  . Restrictive lung disease    SURGICAL HISTORY: Past Surgical History:  Procedure Laterality Date  . ABDOMINAL HYSTERECTOMY    . ESOPHAGOGASTRODUODENOSCOPY N/A 06/15/2015   Procedure: ESOPHAGOGASTRODUODENOSCOPY (EGD);  Surgeon: CGatha Mayer MD;  [facility];  Service: Endoscopy;  Laterality: N/A;  . IR REMOVAL TUN ACCESS W/ PORT W/O FL MOD SED  11/11/2016  . LIVER BIOPSY    . OPEN PARTIAL HEPATECTOMY  Left 02/09/15  . PARTIAL HYSTERECTOMY    . ROTATOR CUFF REPAIR       L rotator cuff repair 11/07-[place] - 12/7/200  . TONSILLECTOMY      SOCIAL HISTORY: Social History   Social History  . Marital status: Divorced    Spouse name: N/A  . Number of children: 4  . Years of education: N/A   Occupational History  . unemployed    Social History Main Topics  . Smoking status: Current Some Day Smoker    Packs/day: 1.00  Years: 49.00    Types: Cigarettes  . Smokeless tobacco: Never Used  . Alcohol use 0.0 oz/week     Comment: occasionally  . Drug use: Yes    Types: Marijuana  . Sexual activity: No   Other Topics Concern  . 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 Breast cancer Mother   . Hypertension Mother   . Coronary artery disease Mother   . Diabetes type II Mother   . Rheum arthritis Mother   . Diabetes type II Sister   . Pancreatic cancer Sister     ALLERGIES:  is allergic to other.  MEDICATIONS:  Current Outpatient Prescriptions  Medication Sig Dispense Refill  . alendronate (FOSAMAX) 70 MG tablet TAKE 1 TABLET  ONCE A WEEK. 12 tablet 2  . allopurinol (ZYLOPRIM) 100 MG tablet Take 1 tablet (100 mg total) by mouth daily. 90 tablet 0  . amLODipine (NORVASC) 10 MG tablet Take 1 tablet (10 mg total) by mouth daily. 90 tablet 1  . aspirin EC 81 MG tablet Take 1 tablet (81 mg total) by mouth daily. 90 tablet 2  . atenolol (TENORMIN) 25 MG tablet Take 1 tablet (25 mg total) by mouth daily. 90 tablet 3  . cholecalciferol (VITAMIN D) 1000 units tablet Take 1 tablet (1,000 Units total) by mouth daily. 30 tablet 11  . diclofenac sodium (VOLTAREN) 1 % GEL Apply 2 g topically 4 (four) times daily as needed. 100 g 4  . DOCOSAHEXAENOIC ACID PO Take 1 capsule by mouth daily.    . fish oil-omega-3 fatty acids 1000 MG capsule Take 1 g by mouth every 30 (thirty) days.     . fluticasone (FLONASE) 50 MCG/ACT nasal spray Place 2 sprays into both nostrils daily as needed for rhinitis. 16 g 2  . Hyprom-Naphaz-Polysorb-Zn Sulf (CLEAR EYES COMPLETE OP) Place 1 drop into both eyes daily as needed (dry eyes).     . Lidocaine-Prilocaine, Bulk, 2.5-2.5 % CREA Apply 5 mLs topically as needed (for port-a-cath use). 1 hour before use, cover with Band aid or plastic wrap to seal 30 g prn  . lisinopril-hydrochlorothiazide (PRINZIDE,ZESTORETIC) 20-25 MG tablet Take 1 tablet by mouth daily. 90 tablet 3  .  loratadine (CLARITIN) 10 MG tablet Take 1 tablet (10 mg total) by mouth daily as needed for allergies. 30 tablet 3  . Multiple Vitamin (MULTIVITAMIN WITH MINERALS) TABS tablet Take 1 tablet by mouth daily.    Bonnie Peterson PROAIR HFA 108 (90 Base) MCG/ACT inhaler INHALE 2 PUFFS INTO THE LUNGS EVERY 4 HOURS AS NEEDED FOR WHEEZING OR SHORTNESS OF BREATH 1 Inhaler 0  . traMADol (ULTRAM) 50 MG tablet Take 1 tablet (50 mg total) by mouth every 6 (six) hours as needed for moderate pain or severe pain. 60 tablet 0  . VITAMIN E PO Take 1 tablet by mouth daily.      No current facility-administered medications for this visit.     REVIEW OF SYSTEMS:    10 Point review of Systems was done is negative except as noted  above.  PHYSICAL EXAMINATION: ECOG PERFORMANCE STATUS: 1 - Symptomatic but completely ambulatory .BP (!) 155/56 (BP Location: Right Arm, Patient Position: Sitting) Comment: RN aware of bp  Pulse 60   Temp 98 F (36.7 C) (Oral)   Resp 17   Ht 5' 3"  (1.6 m)   Wt 185 lb 4.8 oz (84.1 kg)   SpO2 100%   BMI 32.82 kg/m    GENERAL:alert, in no acute distress and comfortable SKIN: skin color, texture, turgor are normal, no rashes or significant lesions EYES: normal, conjunctiva are pink and non-injected, sclera clear OROPHARYNX: Mucous membranes moist NECK: supple, no JVD, thyroid normal size, non-tender, without nodularity LYMPH:  no palpable lymphadenopathy in the cervical, axillary or inguinal LUNGS: clear to auscultation with normal respiratory effort HEART: regular rate & rhythm,  no murmurs and no lower extremity edema ABDOMEN: abdomen Soft with normoactive bowel sounds, No guarding rigidity or rebound. Musculoskeletal: no cyanosis of digits and no clubbing  PSYCH: alert & oriented x 3 with fluent speech NEURO: no focal motor/sensory deficits  LABORATORY DATA:  . CBC Latest Ref Rng & Units 01/29/2017 11/11/2016 10/29/2016  WBC 3.9 - 10.3 10e3/uL 8.2 7.6 7.2  Hemoglobin 11.6 - 15.9 g/dL  12.0 12.5 12.6  Hematocrit 34.8 - 46.6 % 38.6 38.2 39.7  Platelets 145 - 400 10e3/uL 154 141(L) 150   . CBC    Component Value Date/Time   WBC 8.2 01/29/2017 1106   WBC 7.6 11/11/2016 1006   RBC 4.15 01/29/2017 1106   RBC 4.24 11/11/2016 1006   HGB 12.0 01/29/2017 1106   HCT 38.6 01/29/2017 1106   PLT 154 01/29/2017 1106   MCV 93.0 01/29/2017 1106   MCH 28.9 01/29/2017 1106   MCH 29.5 11/11/2016 1006   MCHC 31.1 (L) 01/29/2017 1106   MCHC 32.7 11/11/2016 1006   RDW 14.0 01/29/2017 1106   LYMPHSABS 2.2 01/29/2017 1106   MONOABS 0.7 01/29/2017 1106   EOSABS 0.1 01/29/2017 1106   BASOSABS 0.0 01/29/2017 1106   . CMP Latest Ref Rng & Units 01/29/2017 01/20/2017 10/29/2016  Glucose 70 - 140 mg/dl 144(H) - 115  BUN 7.0 - 26.0 mg/dL 15.4 20.1 16.5  Creatinine 0.6 - 1.1 mg/dL 0.8 0.8 0.8  Sodium 136 - 145 mEq/L 140 - 138  Potassium 3.5 - 5.1 mEq/L 4.1 - 4.4  Chloride 96 - 106 mmol/L - - -  CO2 22 - 29 mEq/L 29 - 26  Calcium 8.4 - 10.4 mg/dL 9.8 - 9.7  Total Protein 6.4 - 8.3 g/dL 7.5 - 7.1  Total Bilirubin 0.20 - 1.20 mg/dL 0.28 - 0.38  Alkaline Phos 40 - 150 U/L 109 - 109  AST 5 - 34 U/L 22 - 16  ALT 0 - 55 U/L 19 - 15     RADIOGRAPHIC STUDIES: I have personally reviewed the radiological images as listed and agreed with the findings in the report.  CT C/A/P 08/06/2016: IMPRESSION: 1. Stable examination demonstrating postoperative changes of left hemi hepatectomy, chronic thickening of the gastric antrum (potentially related to prior radiation therapy, and stable 7 mm pulmonary nodule in the superior segment of the right lower lobe. No findings to suggest local recurrence of disease or definite metastatic disease elsewhere in the chest, abdomen or pelvis. 2. Aortic atherosclerosis, in addition to 3 vessel coronary artery disease. Please note that although the presence of coronary artery calcium documents the presence of coronary artery disease, the severity of this  disease and any potential stenosis cannot be  assessed on this non-gated CT examination. Assessment for potential risk factor modification, dietary therapy or pharmacologic therapy may be warranted, if clinically indicated. 3. Mild diffuse bronchial wall thickening with mild centrilobular emphysema ; imaging findings suggestive of underlying COPD. 4. Additional incidental findings, as above.   Electronically Signed   By: Vinnie Langton M.D.   On: 08/06/2016 11:11  CT C/A/P 01/20/2017 IMPRESSION: 1. Stable CT. Postoperative changes from left hepatectomy are again noted. No findings to suggest local tumor recurrence or metastatic disease. 2. Nonspecific pulmonary nodules in the right lower lobe remain stable. 3. Aortic Atherosclerosis (ICD10-I70.0). Coronary artery calcifications noted. 4.  Emphysema (ICD10-J43.9).   Electronically Signed   By: Kerby Moors M.D.   On: 01/20/2017 15:48   ASSESSMENT & PLAN:   71 yo with multiple medical co-morbids  1) Intrahepatic invasive cholangiocarcinoma T2a N1 M0 (Stage IVA) with focal positive parenchymal margins and 1/2 LN positive. CT C/A/P on 04/03/2016 showed no evidence of cholangiocarcinoma local recurrence or metastases at this time. Patient  has completed her adjuvant concurrent chemo-radiation  on 05/25/2015 . She did overall quite well with treatment without any overt prohibitive toxicities. 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  Required treatment with high-dose steroids and dietary modifications.  Symptoms progressive to have resolved.  Currently on PPI for overt symptoms. CT chest abdomen pelvis in March 2017 showed no evidence of disease progression. Patient was [facility] at [facility] by Dr. Dennison Nancy and was given the option of observation versus adjuvant gemcitabine. She had a followup at [facility] with a CT Chest abdomen pelvis on 10/16/2015 which showed no evidence of recurrent or  metastatic disease. Patient has completed 4 months of Adjuvant gemcitabine as per recommendation from [facility]. Rpt CT C/A/P  On 01/17/2016 -- shows no evidence of disease recurrence/progression. Rpt CT C/A/P  On 08/07/2015 -- shows no evidence of disease recurrence/progression.   Plan -No clinical or lab evidence of disease progression at this time  - C chest/Abd/pelvis was reviewed in details with the patient and shows no evidence of local recurrence or metastatic disease -cancer rehab referral was offered to help with fatigue but the patient declined this.   2) Smoking and marijuana use -counseled regarding smoking and drug use cessation  3) Mild thrombocytopenia-resolved.   Return to clinic with Dr. Irene Limbo in 3 months with labs   Sullivan Lone MD [place] AAHIVMS [facility] Hematology/Oncology Physician [place]  (Office):       [phone] (Work cell):  [phone] (Fax):           [phone]  This document serves as a record of services personally performed by Sullivan Lone, MD. It was created on her behalf by Alean Rinne, a trained medical scribe. The creation of this record is based on the scribe's personal observations and the provider's statements to them. This document has been checked and approved by the attending provider.

## 2017-01-30 LAB — CANCER ANTIGEN 19-9: CA 19-9: 13 U/mL (ref 0–35)

## 2017-02-03 ENCOUNTER — Telehealth: Payer: Self-pay | Admitting: Hematology

## 2017-02-03 NOTE — Telephone Encounter (Signed)
Scheduled appt per 10/24 los - lab and f/u in 3 months - reminder letter sent in the mail.

## 2017-02-04 ENCOUNTER — Ambulatory Visit
Admission: RE | Admit: 2017-02-04 | Discharge: 2017-02-04 | Disposition: A | Discharge status: Home / Self Care | Payer: Medicare HMO | Source: Ambulatory Visit | Attending: Family Medicine | Admitting: Family Medicine

## 2017-02-04 ENCOUNTER — Encounter: Payer: Self-pay | Admitting: Family Medicine

## 2017-02-04 DIAGNOSIS — M810 Age-related osteoporosis without current pathological fracture: Secondary | ICD-10-CM

## 2017-02-04 DIAGNOSIS — Z1239 Encounter for other screening for malignant neoplasm of breast: Secondary | ICD-10-CM

## 2017-02-24 ENCOUNTER — Other Ambulatory Visit: Payer: Self-pay | Admitting: Family Medicine

## 2017-02-24 DIAGNOSIS — M1A9XX0 Chronic gout, unspecified, without tophus (tophi): Secondary | ICD-10-CM

## 2017-03-25 ENCOUNTER — Encounter: Payer: Self-pay | Admitting: Family Medicine

## 2017-03-25 ENCOUNTER — Other Ambulatory Visit: Payer: Self-pay

## 2017-03-25 ENCOUNTER — Ambulatory Visit (INDEPENDENT_AMBULATORY_CARE_PROVIDER_SITE_OTHER): Payer: Medicare HMO | Admitting: Family Medicine

## 2017-03-25 VITALS — BP 138/62 | HR 73 | Temp 98.4°F | Ht 63.0 in | Wt 193.0 lb

## 2017-03-25 DIAGNOSIS — E119 Type 2 diabetes mellitus without complications: Secondary | ICD-10-CM | POA: Diagnosis not present

## 2017-03-25 LAB — POCT GLYCOSYLATED HEMOGLOBIN (HGB A1C): Hemoglobin A1C: 6.5

## 2017-03-25 NOTE — Patient Instructions (Signed)
I believe you can walk a mile by next visit! This will help with weight and with your a1c.   Blood pressure looks good today.   Happy holidays!

## 2017-03-25 NOTE — Progress Notes (Signed)
    Subjective:  Bonnie Peterson is a 71 y.o. female who presents to the [facility] today with a chief complaint of nosebleeds and DM follow up.   HPI:  Nosebleeds - lives in public housing where the heat is blasted causing her nose to dry out on the inside - has only had 2 small self limited nose bleeds in the last few weeks - improved since started using a humidifier  Diabetes - has been working on diet but not really exercising like she should - goal is to stay off medications - no polydipsia or polyuria - no vision changes - no CP, SOB - has noticed some increased weight gain which is concerning for her   ROS: Per HPI  Objective:  Physical Exam: BP 138/62   Pulse 73   Temp 98.4 F (36.9 C) (Oral)   Ht 5\' 3"  (1.6 m)   Wt 193 lb (87.5 kg)   SpO2 97%   BMI 34.19 kg/m   Gen: NAD, resting comfortably CV: RRR with no murmurs appreciated Pulm: NWOB, CTAB with no crackles, wheezes, or rhonchi GI: Normal bowel sounds present. Soft, Nontender, Nondistended. MSK: no edema, cyanosis, or clubbing noted Skin: warm, dry Neuro: grossly normal, moves all extremities Psych: Normal affect and thought content  Results for orders placed or performed in visit on 03/25/17 (from the past 72 hour(s))  HgB A1c     Status: None   Collection Time: 03/25/17  2:45 PM  Result Value Ref Range   Hemoglobin A1C 6.5      Assessment/Plan:  Type 2 diabetes mellitus without complication, without long-term current use of insulin (HCC) A1c only slightly increased from 6.4 in Sept to 6.5 today which is now in diabetic range but reassuring that rate of rise has decreased with dietary changes. Since patient goal is to stay off medications and to maintain/lose weight discussed now focusing on daily exercise. Patient voiced that she wants to be able to walk a mile by next visit. Follow up in 3 months.  Nosebleeds Likely from dry air from heater that is improved with use of humidifier. No red flags on history  or exam. Patient reassured and encouraged continued use of home humidifier during winter months.  Bufford Lope, DO PGY-2, [facility] 03/25/2017 2:54 PM

## 2017-03-26 NOTE — Assessment & Plan Note (Addendum)
A1c only slightly increased from 6.4 in Sept to 6.5 today which is now in diabetic range but reassuring that rate of rise has decreased with dietary changes. Since patient goal is to stay off medications and to maintain/lose weight discussed now focusing on daily exercise. Patient voiced that she wants to be able to walk a mile by next visit. Follow up in 3 months.

## 2017-05-01 NOTE — Progress Notes (Signed)
Bonnie Peterson    HEMATOLOGY/ONCOLOGY CLINIC NOTE  Date of Service: 05/06/17     Patient Care Team: Bufford Lope, DO as PCP - General Melancon, York Ram, MD (Inactive) as Resident (Family Medicine)  CHIEF COMPLAINTS/PURPOSE OF CONSULTATION:   F/u for Cholangiocarcinoma   DIAGNOSIS  Stage IVA (T2a, pN1, M0) Intrahepatic Cholangiocarcinoma  Current Treatment  Active Surveillance  Previous treatment 02/09/15 L lobe of liver partial hepatectomy segments 2, 3, 4a, 4b invasive cholangiocarcinoma, moderately differentiated. Tumor focally extends to the parenchymal margin of resection. Uninvolved liver parenchyma with steatohepatitis and associated periportal and centrilobular pericellular fibrosis. Scattered von Meyenburg complexes. One hepatic artery LN with metastatic cholangiocarcinoma. Fibroadipose tissue and nerve negative for malignancy. Gallbladder negative for malignancy. One LN negative for malignancy. T2aN1 invasive cholangiocarcinoma, moderately differentiated, with positive hepatic parenchymal margin, and 1/2 nodes positive for tumor involvement.  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  Completed adjuvant gemcitabine x 4 months  Interval History   [name] here for her scheduled followup. She was last seen by me 3 months ago. In interim she had a screening mammogram in 01/2017 which was normal and a DEXA scan in 01/2017 which showed evidence of osteopenia.   She notes she is down to smoking 1/2 pack of cigarettes a day. She is still using marijunana. She is trying to lower her alcohol intake as well. She notes if she eats too much she will have abdominal pains. She notes some morning she will have frequent BMs.   No change in Bowel habits. No overt rectal bleeding.  MEDICAL HISTORY:  Past  Medical History:  Diagnosis Date  . Anxiety   . Benign positional vertigo   . [place] ([place]15   intrahepatic cholangiocarcinoma  . COPD (chronic obstructive pulmonary disease) ([place]   . Depression   . Diabetes mellitus without complication ([place]   . Early cataracts, bilateral 10/13   Optho, Dr SGershon Crane . Echocardiogram findings abnormal, without diagnosis 10/10   10/10: mild pulm HTN, EF 60-65%, mild LVH, moderate aortic regurg  . GERD (gastroesophageal reflux disease)    I have "acid reflux"  . Gout   . HLD (hyperlipidemia)   . HTN, goal below 130/80   . Irritable bowel syndrome   . Obesity, Class III, BMI 40-49.9 (morbid obesity) ([place]   . Obstructive sleep apnea    wears cpap  . Osteoarthritis (arthritis due to wear and tear of joints)    also gout  . Restrictive lung disease    SURGICAL HISTORY: Past Surgical History:  Procedure Laterality Date  . ABDOMINAL HYSTERECTOMY    . ESOPHAGOGASTRODUODENOSCOPY N/A 06/15/2015   Procedure: ESOPHAGOGASTRODUODENOSCOPY (EGD);  Surgeon: CGatha Mayer MD;  [facility];  Service: Endoscopy;  Laterality: N/A;  . IR REMOVAL TUN ACCESS W/ PORT W/O FL MOD SED  11/11/2016  . LIVER BIOPSY    . OPEN PARTIAL HEPATECTOMY  Left 02/09/15  . PARTIAL HYSTERECTOMY    . ROTATOR CUFF REPAIR       L rotator cuff repair 11/07-[place] - 12/7/200  . TONSILLECTOMY      SOCIAL HISTORY: Social History   Socioeconomic History  . Marital status: Divorced    Spouse name: Not on file  . Number of children: 4  . Years of education: Not on file  . Highest education level:  Not on file  Social Needs  . Financial resource strain: Not on file  . Food insecurity - worry: Not on file  . Food insecurity - inability: Not on file  . Transportation needs - medical: Not on file  . Transportation needs - non-medical: Not on file  Occupational History  . Occupation: unemployed  Tobacco Use  . Smoking status: Current Some Day Smoker    Packs/day:  0.50    Years: 49.00    Pack years: 24.50    Types: Cigarettes  . Smokeless tobacco: Never Used  Substance and Sexual Activity  . Alcohol use: Yes    Alcohol/week: 0.0 oz    Comment: occasionally  . Drug use: Yes    Types: Marijuana  . Sexual activity: No  Other Topics Concern  . 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 Breast cancer Mother   . Hypertension Mother   . Coronary artery disease Mother   . Diabetes type II Mother   . Rheum arthritis Mother   . Diabetes type II Sister   . Pancreatic cancer Sister     ALLERGIES:  is allergic to other.  MEDICATIONS:  Current Outpatient Medications  Medication Sig Dispense Refill  . alendronate (FOSAMAX) 70 MG tablet TAKE 1 TABLET  ONCE A WEEK. 12 tablet 2  . allopurinol (ZYLOPRIM) 100 MG tablet TAKE 1 TABLET (100 MG TOTAL) BY MOUTH DAILY. 90 tablet 0  . amLODipine (NORVASC) 10 MG tablet Take 1 tablet (10 mg total) by mouth daily. 90 tablet 1  . aspirin EC 81 MG tablet Take 1 tablet (81 mg total) by mouth daily. 90 tablet 2  . atenolol (TENORMIN) 25 MG tablet Take 1 tablet (25 mg total) by mouth daily. 90 tablet 3  . cholecalciferol (VITAMIN D) 1000 units tablet Take 1 tablet (1,000 Units total) by mouth daily. 30 tablet 11  . diclofenac sodium (VOLTAREN) 1 % GEL Apply 2 g topically 4 (four) times daily as needed. 100 g 4  . fish oil-omega-3 fatty acids 1000 MG capsule Take 1 g by mouth every 30 (thirty) days.     . fluticasone (FLONASE) 50 MCG/ACT nasal spray Place 2 sprays into both nostrils daily as needed for rhinitis. 16 g 2  . Hyprom-Naphaz-Polysorb-Zn Sulf (CLEAR  EYES COMPLETE OP) Place 1 drop into both eyes daily as needed (dry eyes).     Bonnie Peterson lisinopril-hydrochlorothiazide (PRINZIDE,ZESTORETIC) 20-25 MG tablet Take 1 tablet by mouth daily. 90 tablet 3  . loratadine (CLARITIN) 10 MG tablet Take 1 tablet (10 mg total) by mouth daily as needed for allergies. 30 tablet 3  . Multiple Vitamin (MULTIVITAMIN WITH MINERALS) TABS tablet Take 1 tablet by mouth daily.    Bonnie Peterson VITAMIN E PO Take 1 tablet by mouth daily.     . Lidocaine-Prilocaine, Bulk, 2.5-2.5 % CREA Apply 5 mLs topically as needed (for port-a-cath use). 1 hour before use, cover with Band aid or plastic wrap to seal (Patient not taking: Reported on 05/06/2017) 30 g prn  . PROAIR HFA 108 (90 Base) MCG/ACT inhaler INHALE 2  PUFFS INTO THE LUNGS EVERY 4 HOURS AS NEEDED FOR WHEEZING OR SHORTNESS OF BREATH (Patient not taking: Reported on 05/06/2017) 1 Inhaler 0  . traMADol (ULTRAM) 50 MG tablet Take 1 tablet (50 mg total) by mouth every 6 (six) hours as needed for moderate pain or severe pain. (Patient not taking: Reported on 05/06/2017) 60 tablet 0   No current facility-administered medications for this visit.     REVIEW OF SYSTEMS:    10 Point review of Systems was done is negative except as noted above.  PHYSICAL EXAMINATION: ECOG PERFORMANCE STATUS: 1 - Symptomatic but completely ambulatory .BP (!) 170/66 (BP Location: Left Arm, Patient Position: Sitting)   Pulse 68   Temp 98.2 F (36.8 C) (Oral)   Resp 20   Ht 5' 3"  (1.6 m)   Wt 194 lb 14.4 oz (88.4 kg)   SpO2 99%   BMI 34.52 kg/m   GENERAL:alert, in no acute distress and comfortable SKIN: skin color, texture, turgor are normal, no rashes or significant lesions EYES: normal, conjunctiva are pink and non-injected, sclera clear OROPHARYNX: Mucous membranes moist NECK: supple, no JVD, thyroid normal size, non-tender, without nodularity LYMPH:  no palpable lymphadenopathy in the cervical, axillary or inguinal LUNGS: clear to auscultation with  normal respiratory effort HEART: regular rate & rhythm,  no murmurs and no lower extremity edema ABDOMEN: abdomen Soft with normoactive bowel sounds, No guarding rigidity or rebound. Musculoskeletal: no cyanosis of digits and no clubbing  PSYCH: alert & oriented x 3 with fluent speech NEURO: no focal motor/sensory deficits  LABORATORY DATA:  . CBC Latest Ref Rng & Units 05/06/2017 01/29/2017 11/11/2016  WBC 3.9 - 10.3 K/uL 7.9 8.2 7.6  Hemoglobin 11.6 - 15.9 g/dL 12.4 12.0 12.5  Hematocrit 34.8 - 46.6 % 39.9 38.6 38.2  Platelets 145 - 400 K/uL 146 154 141(L)   . CBC    Component Value Date/Time   WBC 7.9 05/06/2017 1214   RBC 4.40 05/06/2017 1214   RBC 4.40 05/06/2017 1214   HGB 12.4 05/06/2017 1214   HGB 12.0 01/29/2017 1106   HCT 39.9 05/06/2017 1214   HCT 38.6 01/29/2017 1106   PLT 146 05/06/2017 1214   PLT 154 01/29/2017 1106   MCV 90.7 05/06/2017 1214   MCV 93.0 01/29/2017 1106   MCH 28.2 05/06/2017 1214   MCHC 31.1 (L) 05/06/2017 1214   RDW 14.3 05/06/2017 1214   RDW 14.0 01/29/2017 1106   LYMPHSABS 2.0 05/06/2017 1214   LYMPHSABS 2.2 01/29/2017 1106   MONOABS 0.6 05/06/2017 1214   MONOABS 0.7 01/29/2017 1106   EOSABS 0.1 05/06/2017 1214   EOSABS 0.1 01/29/2017 1106   BASOSABS 0.0 05/06/2017 1214   BASOSABS 0.0 01/29/2017 1106   . CMP Latest Ref Rng & Units 05/06/2017 01/29/2017 01/20/2017  Glucose 70 - 140 mg/dL 231(H) 144(H) -  BUN 7 - 26 mg/dL 11 15.4 20.1  Creatinine 0.60 - 1.10 mg/dL 0.85 0.8 0.8  Sodium 136 - 145 mmol/L 137 140 -  Potassium 3.3 - 4.7 mmol/L 3.8 4.1 -  Chloride 98 - 109 mmol/L 101 - -  CO2 22 - 29 mmol/L 28 29 -  Calcium 8.4 - 10.4 mg/dL 9.2 9.8 -  Total Protein 6.4 - 8.3 g/dL 7.2 7.5 -  Total Bilirubin 0.2 - 1.2 mg/dL 0.3 0.28 -  Alkaline Phos 40 - 150 U/L 106 109 -  AST 5 - 34 U/L 15 22 -  ALT 0 - 55 U/L 12 19 -  RADIOGRAPHIC STUDIES: I have personally reviewed the radiological images as listed and agreed with the findings in  the report.  CT C/A/P 08/06/2016: IMPRESSION: 1. Stable examination demonstrating postoperative changes of left hemi hepatectomy, chronic thickening of the gastric antrum (potentially related to prior radiation therapy, and stable 7 mm pulmonary nodule in the superior segment of the right lower lobe. No findings to suggest local recurrence of disease or definite metastatic disease elsewhere in the chest, abdomen or pelvis. 2. Aortic atherosclerosis, in addition to 3 vessel coronary artery disease. Please note that although the presence of coronary artery calcium documents the presence of coronary artery disease, the severity of this disease and any potential stenosis cannot be assessed on this non-gated CT examination. Assessment for potential risk factor modification, dietary therapy or pharmacologic therapy may be warranted, if clinically indicated. 3. Mild diffuse bronchial wall thickening with mild centrilobular emphysema ; imaging findings suggestive of underlying COPD. 4. Additional incidental findings, as above.   Electronically Signed   By: Vinnie Langton M.D.   On: 08/06/2016 11:11  CT C/A/P 01/20/2017 IMPRESSION: 1. Stable CT. Postoperative changes from left hepatectomy are again noted. No findings to suggest local tumor recurrence or metastatic disease. 2. Nonspecific pulmonary nodules in the right lower lobe remain stable. 3. Aortic Atherosclerosis (ICD10-I70.0). Coronary artery calcifications noted. 4.  Emphysema (ICD10-J43.9).   Electronically Signed   By: Kerby Moors M.D.   On: 01/20/2017 15:48   ASSESSMENT & PLAN:   72 yo with multiple medical co-morbids  1) Intrahepatic invasive cholangiocarcinoma T2a N1 M0 (Stage IVA) with focal positive parenchymal margins and 1/2 LN positive. CT C/A/P on 04/03/2016 showed no evidence of cholangiocarcinoma local recurrence or metastases at this time. Patient  has completed her adjuvant concurrent  chemo-radiation  on 05/25/2015 . She did overall quite well with treatment without any overt prohibitive toxicities. 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  Required treatment with high-dose steroids and dietary modifications.  Symptoms progressive to have resolved.  Currently on PPI for overt symptoms. CT chest abdomen pelvis in March 2017 showed no evidence of disease progression. Patient was [facility] at [facility] by Dr. Dennison Nancy and was given the option of observation versus adjuvant gemcitabine. She had a followup at [facility] with a CT Chest abdomen pelvis on 10/16/2015 which showed no evidence of recurrent or metastatic disease. Patient has completed 4 months of Adjuvant gemcitabine as per recommendation from [place]. Rpt CT C/A/P  On 01/17/2016 -- shows no evidence of disease recurrence/progression. Rpt CT C/A/P  On 08/06/2016 -- shows no evidence of disease recurrence/progression. Rpt CT C/A/P on 01/20/17 shows no evidence of local recurrence or metastatic disease     Plan  -No clinical or lab evidence of disease progression at this time  -cancer rehab referral was offered to help with fatigue but the patient declined this. -Will continue to follow her every 3-4 months with CT CAPs every other visit.    2) Smoking and marijuana use -I again counseled on smoking and marijuana cessation, she is working on quitting.   3) Mild thrombocytopenia-resolved.    CT chest/abd in 3 months RTC with Dr Irene Limbo in 3.5 months with labs   Sullivan Lone MD [place] AAHIVMS [facility] [facility] Hematology/Oncology Physician [place]  (Office):       [phone] (Work cell):  [phone] (Fax):           [phone]  This document serves as a record of services personally  performed by Sullivan Lone, MD. It was created on his behalf by Joslyn Devon, a trained medical scribe. The creation of this record is based on the scribe's personal observations and the provider's  statements to them.    .I have reviewed the above documentation for accuracy and completeness, and I agree with the above. Brunetta Genera MD MS

## 2017-05-06 ENCOUNTER — Inpatient Hospital Stay: Payer: Medicare HMO

## 2017-05-06 ENCOUNTER — Inpatient Hospital Stay: Payer: Medicare HMO | Attending: Hematology | Admitting: Hematology

## 2017-05-06 ENCOUNTER — Encounter: Payer: Self-pay | Admitting: Hematology

## 2017-05-06 VITALS — BP 170/66 | HR 68 | Temp 98.2°F | Resp 20 | Ht 63.0 in | Wt 194.9 lb

## 2017-05-06 DIAGNOSIS — F17210 Nicotine dependence, cigarettes, uncomplicated: Secondary | ICD-10-CM

## 2017-05-06 DIAGNOSIS — C799 Secondary malignant neoplasm of unspecified site: Secondary | ICD-10-CM

## 2017-05-06 DIAGNOSIS — F1290 Cannabis use, unspecified, uncomplicated: Secondary | ICD-10-CM | POA: Diagnosis not present

## 2017-05-06 DIAGNOSIS — C221 Intrahepatic bile duct carcinoma: Secondary | ICD-10-CM | POA: Diagnosis present

## 2017-05-06 LAB — COMPREHENSIVE METABOLIC PANEL
ALT: 12 U/L (ref 0–55)
AST: 15 U/L (ref 5–34)
Albumin: 3.2 g/dL — ABNORMAL LOW (ref 3.5–5.0)
Alkaline Phosphatase: 106 U/L (ref 40–150)
Anion gap: 8 (ref 3–11)
BUN: 11 mg/dL (ref 7–26)
CO2: 28 mmol/L (ref 22–29)
Calcium: 9.2 mg/dL (ref 8.4–10.4)
Chloride: 101 mmol/L (ref 98–109)
Creatinine, Ser: 0.85 mg/dL (ref 0.60–1.10)
GFR calc Af Amer: >60 mL/min (ref >60)
GFR calc non Af Amer: >60 mL/min (ref >60)
Glucose, Bld: 231 mg/dL — ABNORMAL HIGH (ref 70–140)
Potassium: 3.8 mmol/L (ref 3.3–4.7)
Sodium: 137 mmol/L (ref 136–145)
Total Bilirubin: 0.3 mg/dL (ref 0.2–1.2)
Total Protein: 7.2 g/dL (ref 6.4–8.3)

## 2017-05-06 LAB — CBC WITH DIFFERENTIAL/PLATELET
Basophils Absolute: 0 10*3/uL (ref 0.0–0.1)
Basophils Relative: 0 %
Eosinophils Absolute: 0.1 10*3/uL (ref 0.0–0.5)
Eosinophils Relative: 1 %
HCT: 39.9 % (ref 34.8–46.6)
Hemoglobin: 12.4 g/dL (ref 11.6–15.9)
Lymphocytes Relative: 25 %
Lymphs Abs: 2 10*3/uL (ref 0.9–3.3)
MCH: 28.2 pg (ref 25.1–34.0)
MCHC: 31.1 g/dL — ABNORMAL LOW (ref 31.5–36.0)
MCV: 90.7 fL (ref 79.5–101.0)
Monocytes Absolute: 0.6 10*3/uL (ref 0.1–0.9)
Monocytes Relative: 7 %
Neutro Abs: 5.3 10*3/uL (ref 1.5–6.5)
Neutrophils Relative %: 67 %
Platelets: 146 10*3/uL (ref 145–400)
RBC: 4.4 MIL/uL (ref 3.70–5.45)
RDW: 14.3 % (ref 11.2–16.1)
WBC: 7.9 10*3/uL (ref 3.9–10.3)

## 2017-05-06 LAB — RETICULOCYTES
RBC.: 4.4 MIL/uL (ref 3.70–5.45)
Retic Count, Absolute: 105.6 10*3/uL — ABNORMAL HIGH (ref 33.7–90.7)
Retic Ct Pct: 2.4 % — ABNORMAL HIGH (ref 0.7–2.1)

## 2017-05-07 LAB — CANCER ANTIGEN 19-9: CA 19-9: 12 U/mL (ref 0–35)

## 2017-05-08 ENCOUNTER — Encounter: Payer: Self-pay | Admitting: Family Medicine

## 2017-05-09 ENCOUNTER — Encounter: Payer: Self-pay | Admitting: Family Medicine

## 2017-05-15 ENCOUNTER — Encounter: Payer: Self-pay | Admitting: *Deleted

## 2017-05-15 ENCOUNTER — Other Ambulatory Visit: Payer: Self-pay | Admitting: Hematology

## 2017-05-15 DIAGNOSIS — C221 Intrahepatic bile duct carcinoma: Secondary | ICD-10-CM

## 2017-05-15 DIAGNOSIS — W19XXXD Unspecified fall, subsequent encounter: Secondary | ICD-10-CM

## 2017-05-15 NOTE — Progress Notes (Signed)
Pt brought in letter to be signed by Dr. Irene Limbo for "verification of need for a reasonable accomodation"  Pt requesting ramp for outside of her home.  Per Dr. Irene Limbo, patient is not wheelchair bound, and has decent level of mobility.  Dr. Irene Limbo unaware of home conditions/unable to assess need for ramp.  Referral placed to [facility] for evaluation for safety needs.  Dr. Irene Limbo will then sign form if need is determined.  Fax sent for referral.  Fax confirmation received.  Letter mailed back to patient with explanation.

## 2017-05-22 ENCOUNTER — Other Ambulatory Visit: Payer: Self-pay | Admitting: Family Medicine

## 2017-05-22 DIAGNOSIS — M1A9XX0 Chronic gout, unspecified, without tophus (tophi): Secondary | ICD-10-CM

## 2017-06-03 ENCOUNTER — Other Ambulatory Visit: Payer: Self-pay

## 2017-06-03 DIAGNOSIS — M1A9XX0 Chronic gout, unspecified, without tophus (tophi): Secondary | ICD-10-CM

## 2017-06-03 MED ORDER — ALLOPURINOL 100 MG PO TABS: 100.0000 mg | ORAL_TABLET | Freq: Every day | ORAL | 0 refills | Status: DC

## 2017-07-07 ENCOUNTER — Telehealth: Payer: Self-pay

## 2017-07-07 DIAGNOSIS — C221 Intrahepatic bile duct carcinoma: Secondary | ICD-10-CM

## 2017-07-07 NOTE — Telephone Encounter (Signed)
Samantha from [facility] calling regarding patient. Pt has upcomming appointment with Dr. Shawna Orleans- Dr. Irene Limbo at cancer center requesting we draw labs pt needs while she is here. Requesting a CBC with Diff, CMP and CA 19.9. Would like a return call to let them know if we can do this or not. Call back number [phone] Wallace Cullens, RN

## 2017-07-07 NOTE — Telephone Encounter (Signed)
Pt was scheduled for CT 4/2. Received call from [facility] in Radiology requesting pt to have lab work prior to scan. Spoke with pt and scan had been rescheduled for 07/29/17 because it was not authorized by insurance. Pt suggested having lab work completed by PCP, Dr. Shawna Orleans at 4/4 visit. Called [phone] for the [facility] and left VM on triage line explaining what lab work is being requested and asked for a call back to [phone] to notify if CBC w/ diff, CMP, and CA 19.9 could be drawn at their office and the results faxed to [phone]. In-bsket sent to [employer] Dilling and Orson Eva regarding prior authorization of clinic visit in May and CT scan. Pt wondering if they are both authorized.

## 2017-07-08 ENCOUNTER — Telehealth: Payer: Self-pay

## 2017-07-08 ENCOUNTER — Ambulatory Visit (HOSPITAL_COMMUNITY): Admission: RE | Admit: 2017-07-08 | Payer: Medicare HMO | Source: Ambulatory Visit

## 2017-07-08 ENCOUNTER — Other Ambulatory Visit: Payer: Medicare HMO

## 2017-07-08 NOTE — Telephone Encounter (Signed)
We cannot do outside labs here. She will either need to go to [facility] or Dr Shawna Orleans will need to place the orders under her name and follow the results. Freeland Pracht, [place]

## 2017-07-08 NOTE — Telephone Encounter (Signed)
Labs placed as future. Associated with intrahepatic cholangiocarcinoma. Will follow up and route labs to Dr. Irene Limbo when resulted.

## 2017-07-08 NOTE — Telephone Encounter (Signed)
Spoke with CMA at [facility] on behalf of Dr. Shawna Orleans regarding lab work to be drawn at PCP visit. Dr. Shawna Orleans to add orders for CBC with differential, CMP, and CA 19.9. Confirmed associated diagnosis with CA 19.9 so that insurance will cover appropriately. Pt lab appt on 08/13/17 cancelled.

## 2017-07-08 NOTE — Telephone Encounter (Signed)
Spoke with Aldona Bar- states would like to have Dr. Shawna Orleans place orders for labs. States the Ca 19 will need diagnosis of "intrahepatic cholangio carcinoma" Wallace Cullens, RN

## 2017-07-08 NOTE — Telephone Encounter (Signed)
I am fine with this if our lab can accommodate. As Dr. Irene Limbo as already ordered the labs as future collect, did not place additional labs.

## 2017-07-08 NOTE — Telephone Encounter (Signed)
Can we please ask Cancer center if they would like for me to order the labs? I am happy to do so and route to Dr. Irene Limbo once labs have resulted.

## 2017-07-10 ENCOUNTER — Ambulatory Visit (INDEPENDENT_AMBULATORY_CARE_PROVIDER_SITE_OTHER): Payer: Medicare HMO | Admitting: Family Medicine

## 2017-07-10 ENCOUNTER — Encounter: Payer: Self-pay | Admitting: Family Medicine

## 2017-07-10 VITALS — BP 148/62 | HR 71 | Temp 98.3°F | Wt 196.2 lb

## 2017-07-10 DIAGNOSIS — E785 Hyperlipidemia, unspecified: Secondary | ICD-10-CM | POA: Diagnosis not present

## 2017-07-10 DIAGNOSIS — C221 Intrahepatic bile duct carcinoma: Secondary | ICD-10-CM

## 2017-07-10 DIAGNOSIS — E119 Type 2 diabetes mellitus without complications: Secondary | ICD-10-CM

## 2017-07-10 DIAGNOSIS — M19011 Primary osteoarthritis, right shoulder: Secondary | ICD-10-CM

## 2017-07-10 DIAGNOSIS — M19012 Primary osteoarthritis, left shoulder: Secondary | ICD-10-CM

## 2017-07-10 LAB — POCT GLYCOSYLATED HEMOGLOBIN (HGB A1C): Hemoglobin A1C: 7.9

## 2017-07-10 MED ORDER — METFORMIN HCL 500 MG PO TABS: 500.0000 mg | ORAL_TABLET | Freq: Every day | ORAL | 1 refills | Status: DC

## 2017-07-10 MED FILL — metFORMIN HCL 500 MG TABS: 500 | 30 days supply | Qty: 30 | Fill #0

## 2017-07-10 NOTE — Patient Instructions (Addendum)
It was good to see you today!  Start metformin 500mg  once day, eat with food.  Tylenol 650mg  every 8 hours or if you have a higher dose at home can do twice a day.   Please check-out at the front desk before leaving the clinic. Make an appointment in  2 months for diabetes follow up.  We are checking some labs today. If results require attention, either myself or my nurse will get in touch with you. If everything is normal, you will get a letter in the mail or a message in My Chart. Please give [place] a call if you do not hear from [place] after 2 weeks.  Please bring all of your medications with you to each visit.   Sign up for My Chart to have easy access to your labs results, and communication with your primary care physician.  Feel free to call with any questions or concerns at any time, at [phone].   Take care,  Dr. Bufford Lope, [place]    Obtain twice the volume of vegetables as either protein or starchy foods for both lunch and dinner.   You may also read about the DASH diet at: [email or web address]

## 2017-07-10 NOTE — Progress Notes (Signed)
    Subjective:  Bonnie Peterson is a 72 y.o. female who presents to the [facility] today for diabetes follow up.  HPI:  Diabetes  Last visit for diabetes on 03/25/17, patient's goal was to stay off medications and to maintain/lose weight. Since last visit patient states that she has had difficulty following a diabetic diet, has been eating a lot. Does sometimes has pepsi cola but not frequently. She has not been exercising and has not met her goal of being able to walk a mile.  No CP, SOB, polyuria, polydipsia.  24-hr recall (Up at  830AM) 9 Am: 2 cup decaf coffee with cream and sugar B ( 10AM)- oatmeal with butter   L ( 130PM)- pressed black forest ham sandwich with mayo, water  D ( 6 PM)- roasted chicken, wonton bowl with veggies, water Snk ( PM)- strawberries Typical day? Yes.    Arthritis  Has long standing aches and pains in her shoulders that she used to take tylenol for. Recently had a leftover tramadol from when she was being prescribed for cancer. She states that tramadol worked well and wants a refill. Is no longer on tylenol.   ROS: Per HPI  Objective:  Physical Exam: BP (!) 148/62   Pulse 71   Temp 98.3 F (36.8 C) (Oral)   Wt 196 lb 3.2 oz (89 kg)   SpO2 97%   BMI 34.76 kg/m   Gen: NAD, resting comfortably CV: RRR with no murmurs appreciated Pulm: NWOB, CTAB with no crackles, wheezes, or rhonchi GI: Normal bowel sounds present. Soft, Nontender, Nondistended. MSK: no edema, cyanosis, or clubbing noted Skin: warm, dry Neuro: grossly normal, moves all extremities Psych: Normal affect and thought content  Results for orders placed or performed in visit on 07/10/17 (from the past 72 hour(s))  HgB A1c     Status: None   Collection Time: 07/10/17  3:01 PM  Result Value Ref Range   Hemoglobin A1C 7.9      Assessment/Plan:  Type 2 diabetes mellitus without complication, without long-term current use of insulin (HCC) A1c increased to 7.9 from 6.5 in Dec likely  due to poor diabetic diet compliance. Start low dose metformin '500mg'$  qd and patient counseled on healthy diet/exercise today. Follow up in 2 months.  Osteoarthritis Patient has long standing osteoarthritis in her shoulders that she used to follow with orthopedics. She does have a personal care aide to help her with some ADLs. She has not been using tylenol or had a shoulder injection recently. Advised that patient try tylenol as first line to manage her pain given that tramadol is a controlled medication. She voiced good understanding and that she would like to avoid controlled medications. Given that, recommended that steroid injections may be an option for her if tylenol is not helpful.   Bufford Lope, DO PGY-2, [facility] 07/10/2017 2:46 PM

## 2017-07-11 LAB — LIPID PANEL
Chol/HDL Ratio: 4.8 ratio — ABNORMAL HIGH (ref 0.0–4.4)
Cholesterol, Total: 172 mg/dL (ref 100–199)
HDL: 36 mg/dL — ABNORMAL LOW (ref >39)
LDL Calculated: 109 mg/dL — ABNORMAL HIGH (ref 0–99)
Triglycerides: 136 mg/dL (ref 0–149)
VLDL Cholesterol Cal: 27 mg/dL (ref 5–40)

## 2017-07-11 LAB — LDL CHOLESTEROL, DIRECT: LDL Direct: 115 mg/dL — ABNORMAL HIGH (ref 0–99)

## 2017-07-11 LAB — CANCER ANTIGEN 19-9: CA 19-9: 15 U/mL (ref 0–35)

## 2017-07-11 NOTE — Assessment & Plan Note (Addendum)
A1c increased to 7.9 from 6.5 in Dec likely due to poor diabetic diet compliance. Start low dose metformin 500mg  qd and patient counseled on healthy diet/exercise today. Follow up in 2 months.

## 2017-07-11 NOTE — Assessment & Plan Note (Signed)
Patient has long standing osteoarthritis in her shoulders that she used to follow with orthopedics. She does have a personal care aide to help her with some ADLs. She has not been using tylenol or had a shoulder injection recently. Advised that patient try tylenol as first line to manage her pain given that tramadol is a controlled medication. She voiced good understanding and that she would like to avoid controlled medications. Given that, recommended that steroid injections may be an option for her if tylenol is not helpful.

## 2017-07-14 ENCOUNTER — Telehealth: Payer: Self-pay | Admitting: Family Medicine

## 2017-07-14 ENCOUNTER — Encounter: Payer: Self-pay | Admitting: Family Medicine

## 2017-07-14 NOTE — Telephone Encounter (Signed)
Left message for patient to call back regarding labs. She needs CBC and CMP redrawn. Also I will need to discuss high cholesterol with her.

## 2017-07-14 NOTE — Telephone Encounter (Signed)
Patient returned your call. Left number of [phone]. Danley Danker, RN [facility] RN)

## 2017-07-14 NOTE — Telephone Encounter (Addendum)
Called patient, discussed that still needs CBC and CMP. Informed about high cholesterol and that would need to start statin in the future. Appt with me made on 07/15/17.

## 2017-07-15 ENCOUNTER — Ambulatory Visit: Payer: Medicare HMO | Admitting: Family Medicine

## 2017-07-16 ENCOUNTER — Telehealth: Payer: Self-pay

## 2017-07-16 ENCOUNTER — Other Ambulatory Visit: Payer: Self-pay | Admitting: Family Medicine

## 2017-07-16 DIAGNOSIS — E119 Type 2 diabetes mellitus without complications: Secondary | ICD-10-CM

## 2017-07-16 DIAGNOSIS — C221 Intrahepatic bile duct carcinoma: Secondary | ICD-10-CM

## 2017-07-16 NOTE — Telephone Encounter (Signed)
Called pt to inform of lab apt scheduled for 4/16. Pt was very and demanded that she needed to been by pcp as well, however, no apt for this. Pt stated, "she knows I am supposed to talk to her." I see you have a schedule for that morning. Just a heads up. Not sure if you have already planned to speak with her.

## 2017-07-16 NOTE — Telephone Encounter (Signed)
-----   Message from Bufford Lope, DO sent at 07/16/2017  9:07 AM EDT ----- As there may have been a miscommunication regarding when I told the patient to return for lab redraw, can we please call patient and confirm that she knows about the lab appointment? Thank you

## 2017-07-16 NOTE — Telephone Encounter (Addendum)
Called patient. Patient will come in on lab draw on 16th. She will make appointment with me in May or June. She needs a new glucose meter.

## 2017-07-16 NOTE — Telephone Encounter (Signed)
There are no openings that day.  Maximina Pirozzi,CMA

## 2017-07-16 NOTE — Progress Notes (Signed)
Please note that when patient was at [facility] on 07/10/17, lab collected lipid panel and LDL and CA 19.9 but did not collect the ordered CBC or CMP. These are canceled and placed as future orders as patient agreed to return for recollect.

## 2017-07-16 NOTE — Telephone Encounter (Signed)
If I have an open spot on my schedule, even if same day slot,  I would be happy to see her.

## 2017-07-16 NOTE — Progress Notes (Signed)
I see the future orders. Thank you for the information.

## 2017-07-18 MED ORDER — ONETOUCH VERIO W/DEVICE KIT: 1.0000 | PACK | 0 refills | Status: DC | PRN

## 2017-07-18 NOTE — Addendum Note (Signed)
Addended by: Bufford Lope on: 07/18/2017 04:19 PM   Modules accepted: Orders

## 2017-07-22 ENCOUNTER — Other Ambulatory Visit: Payer: Medicare HMO

## 2017-07-22 DIAGNOSIS — C221 Intrahepatic bile duct carcinoma: Secondary | ICD-10-CM

## 2017-07-23 LAB — CMP14+EGFR
ALT: 13 IU/L (ref 0–32)
AST: 13 IU/L (ref 0–40)
Albumin/Globulin Ratio: 1.2 (ref 1.2–2.2)
Albumin: 3.7 g/dL (ref 3.5–4.8)
Alkaline Phosphatase: 113 IU/L (ref 39–117)
BUN/Creatinine Ratio: 13 (ref 12–28)
BUN: 11 mg/dL (ref 8–27)
Bilirubin Total: 0.2 mg/dL (ref 0.0–1.2)
CO2: 28 mmol/L (ref 20–29)
Calcium: 9.8 mg/dL (ref 8.7–10.3)
Chloride: 98 mmol/L (ref 96–106)
Creatinine, Ser: 0.84 mg/dL (ref 0.57–1.00)
GFR calc Af Amer: 81 mL/min/{1.73_m2} (ref >59)
GFR calc non Af Amer: 70 mL/min/{1.73_m2} (ref >59)
Globulin, Total: 3.1 g/dL (ref 1.5–4.5)
Glucose: 184 mg/dL — ABNORMAL HIGH (ref 65–99)
Potassium: 4.6 mmol/L (ref 3.5–5.2)
Sodium: 138 mmol/L (ref 134–144)
Total Protein: 6.8 g/dL (ref 6.0–8.5)

## 2017-07-23 LAB — CBC WITH DIFFERENTIAL/PLATELET
Basophils Absolute: 0 10*3/uL (ref 0.0–0.2)
Basos: 0 %
EOS (ABSOLUTE): 0.1 10*3/uL (ref 0.0–0.4)
Eos: 1 %
Hematocrit: 38.5 % (ref 34.0–46.6)
Hemoglobin: 12.4 g/dL (ref 11.1–15.9)
Immature Grans (Abs): 0 10*3/uL (ref 0.0–0.1)
Immature Granulocytes: 0 %
Lymphocytes Absolute: 2.2 10*3/uL (ref 0.7–3.1)
Lymphs: 29 %
MCH: 28.3 pg (ref 26.6–33.0)
MCHC: 32.2 g/dL (ref 31.5–35.7)
MCV: 88 fL (ref 79–97)
Monocytes Absolute: 0.4 10*3/uL (ref 0.1–0.9)
Monocytes: 5 %
Neutrophils Absolute: 4.8 10*3/uL (ref 1.4–7.0)
Neutrophils: 65 %
Platelets: 195 10*3/uL (ref 150–379)
RBC: 4.38 x10E6/uL (ref 3.77–5.28)
RDW: 14.9 % (ref 12.3–15.4)
WBC: 7.4 10*3/uL (ref 3.4–10.8)

## 2017-07-29 ENCOUNTER — Ambulatory Visit (HOSPITAL_COMMUNITY)
Admission: RE | Admit: 2017-07-29 | Discharge: 2017-07-29 | Disposition: A | Discharge status: Home / Self Care | Payer: Medicare HMO | Source: Ambulatory Visit | Attending: Hematology | Admitting: Hematology

## 2017-07-29 ENCOUNTER — Encounter (HOSPITAL_COMMUNITY): Payer: Self-pay

## 2017-07-29 DIAGNOSIS — R160 Hepatomegaly, not elsewhere classified: Secondary | ICD-10-CM | POA: Diagnosis not present

## 2017-07-29 DIAGNOSIS — R918 Other nonspecific abnormal finding of lung field: Secondary | ICD-10-CM | POA: Insufficient documentation

## 2017-07-29 DIAGNOSIS — I700 Atherosclerosis of aorta: Secondary | ICD-10-CM | POA: Diagnosis not present

## 2017-07-29 DIAGNOSIS — C221 Intrahepatic bile duct carcinoma: Secondary | ICD-10-CM | POA: Diagnosis present

## 2017-07-29 DIAGNOSIS — J439 Emphysema, unspecified: Secondary | ICD-10-CM | POA: Diagnosis not present

## 2017-07-29 MED ORDER — IOHEXOL 300 MG/ML  SOLN
100.0000 mL | Freq: Once | INTRAMUSCULAR | Status: AC | PRN
  Administered 2017-07-29: 100 mL via INTRAVENOUS

## 2017-07-30 ENCOUNTER — Other Ambulatory Visit: Payer: Self-pay | Admitting: Hematology and Oncology

## 2017-07-30 ENCOUNTER — Telehealth: Payer: Self-pay | Admitting: *Deleted

## 2017-07-30 ENCOUNTER — Telehealth: Payer: Self-pay | Admitting: Family Medicine

## 2017-07-30 ENCOUNTER — Encounter: Payer: Self-pay | Admitting: *Deleted

## 2017-07-30 ENCOUNTER — Telehealth: Payer: Self-pay | Admitting: Medical Oncology

## 2017-07-30 DIAGNOSIS — C221 Intrahepatic bile duct carcinoma: Secondary | ICD-10-CM

## 2017-07-30 NOTE — Telephone Encounter (Signed)
"  when  is bx?" I told pt it is authorized , but not scheduled yet and gave her number for central scheduling.She will call the department.[name]

## 2017-07-30 NOTE — Telephone Encounter (Signed)
Sister of patient is calling and would like the doctor to call or send in something for her sister's nerves. She had a CTof her lung and abdomin done yesterday. They told her that her mass is getting larger and also found another mass that is also large. They are doing biopsies on both but she is beside herself. Please call patient. jw

## 2017-07-30 NOTE — Telephone Encounter (Signed)
Will forward to MD to advise. Hunter Pinkard,CMA  

## 2017-07-30 NOTE — Telephone Encounter (Signed)
Results of CT scan of Chest/abdomen given to dr Lebron Conners for review. Dr Lebron Conners ordered a biopsy of mass found in liver and wants her to be seen by dr kale sooner than 08-13-17. Los to schedulers for appt 08/05/17 with dr kale to go over scan results.

## 2017-07-30 NOTE — Progress Notes (Signed)
[facility]    HEMATOLOGY/ONCOLOGY CLINIC NOTE  Date of Service: 08/05/17   Patient Care Team: Bufford Lope, DO as PCP - General Melancon, York Ram, MD (Inactive) as Resident (Family Medicine)  CHIEF COMPLAINTS/PURPOSE OF CONSULTATION:   F/u for Cholangiocarcinoma   DIAGNOSIS  Stage IVA (T2a, pN1, M0) Intrahepatic Cholangiocarcinoma  Current Treatment  Active Surveillance - concern for intrahepatic recurrence.  Previous treatment 02/09/15 L lobe of liver partial hepatectomy segments 2, 3, 4a, 4b invasive cholangiocarcinoma, moderately differentiated. Tumor focally extends to the parenchymal margin of resection. Uninvolved liver parenchyma with steatohepatitis and associated periportal and centrilobular pericellular fibrosis. Scattered von Meyenburg complexes. One hepatic artery LN with metastatic cholangiocarcinoma. Fibroadipose tissue and nerve negative for malignancy. Gallbladder negative for malignancy. One LN negative for malignancy. T2aN1 invasive cholangiocarcinoma, moderately differentiated, with positive hepatic parenchymal margin, and 1/2 nodes positive for tumor involvement.  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  Completed adjuvant gemcitabine x 4 months  Interval History   [name] here for her scheduled followup and to discuss her recent surveillance scan. She was last seen by me 3 months ago.  She presents to the clinic today noting she is concerned about the new mass found on her recent scan. She notes she was called about her scan results by Dr PLebron Connersand was to have a liver biopsy. She recently saw her PCP and she notes her Hb1c was 7.9 and put her on Metformin. She plans to see her again in June. Patient was very emotional about the possibilities of recurrent cholangiocarcinoma and  going through treatment again.   On review of symptoms, pt note 2 weeks ago her head dropped and she felt sick when she had a bowel movement. She notes she did not mention this to her PCP. She notes she eats most of her meals and her weight has been fluctuating. She notes recent stomach indigestion and took Zantac.     MEDICAL HISTORY:  Past Medical History:  Diagnosis Date  . Anxiety   . Benign positional vertigo   . Cancer ([place] 02/09/15   intrahepatic cholangiocarcinoma  . COPD (chronic obstructive pulmonary disease) ([place]   . Depression   . Diabetes mellitus without complication ([place]   . Early cataracts, bilateral 10/13   Optho, Dr SGershon Crane . Echocardiogram findings abnormal, without diagnosis 10/10   10/10: mild pulm HTN, EF 60-65%, mild LVH, moderate aortic regurg  . GERD (gastroesophageal reflux disease)    I have "acid reflux"  . Gout   . HLD (hyperlipidemia)   . HTN, goal below 130/80   . Irritable bowel syndrome   . Obesity, Class III, BMI 40-49.9 (morbid obesity) ([place]   . Obstructive sleep apnea    wears cpap  . Osteoarthritis (arthritis due to wear and tear of joints)    also gout  . Restrictive lung disease    SURGICAL HISTORY: Past Surgical History:  Procedure Laterality Date  . ABDOMINAL HYSTERECTOMY    . ESOPHAGOGASTRODUODENOSCOPY N/A 06/15/2015   Procedure: ESOPHAGOGASTRODUODENOSCOPY (EGD);  Surgeon: CGatha Mayer MD;  [facility];  Service: Endoscopy;  Laterality: N/A;  . IR REMOVAL TUN ACCESS W/ PORT W/O FL MOD SED  11/11/2016  . LIVER BIOPSY    . OPEN PARTIAL HEPATECTOMY  Left 02/09/15  . PARTIAL HYSTERECTOMY    . ROTATOR  CUFF REPAIR       L rotator cuff repair 11/07-[facility] - 12/7/200  . TONSILLECTOMY      SOCIAL HISTORY: Social History   Socioeconomic History  . Marital status: Divorced    Spouse name: Not on file  . Number of children: 4  . Years of education: Not on file  . Highest education level: Not on file  Occupational  History  . Occupation: unemployed  Social Needs  . Financial resource strain: Not on file  . Food insecurity:    Worry: Not on file    Inability: Not on file  . Transportation needs:    Medical: Not on file    Non-medical: Not on file  Tobacco Use  . Smoking status: Current Some Day Smoker    Packs/day: 0.50    Years: 49.00    Pack years: 24.50    Types: Cigarettes  . Smokeless tobacco: Never Used  Substance and Sexual Activity  . Alcohol use: Yes    Alcohol/week: 0.0 oz    Comment: occasionally  . Drug use: Yes    Types: Marijuana  . Sexual activity: Never  Lifestyle  . Physical activity:    Days per week: Not on file    Minutes per session: Not on file  . Stress: Not on file  Relationships  . Social connections:    Talks on phone: Not on file    Gets together: Not on file    Attends religious service: Not on file    Active member of club or organization: Not on file    Attends meetings of clubs or organizations: Not on file    Relationship status: Not on file  . Intimate partner violence:    Fear of current or ex partner: Not on file    Emotionally abused: Not on file    Physically abused: Not on file    Forced sexual activity: Not on file  Other Topics Concern  . 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 Breast cancer Mother   . Hypertension Mother   . Coronary artery disease Mother   . Diabetes type II Mother   . Rheum arthritis Mother   . Diabetes type II Sister   . Pancreatic cancer Sister     ALLERGIES:  is allergic to other.  MEDICATIONS:  Current Outpatient Medications  Medication Sig  Dispense Refill  . alendronate (FOSAMAX) 70 MG tablet TAKE 1 TABLET  ONCE A WEEK. 12 tablet 2  . allopurinol (ZYLOPRIM) 100 MG tablet TAKE 1 TABLET EVERY DAY 90 tablet 0  . amLODipine (NORVASC) 10 MG tablet Take 1 tablet (10 mg total) by mouth daily. 90 tablet 1  . aspirin EC 81 MG tablet Take 1 tablet (81 mg total) by mouth daily. 90 tablet 2  . atenolol (TENORMIN) 25 MG tablet Take 1 tablet (25 mg total) by mouth daily. 90 tablet 3  . Blood Glucose Monitoring Suppl (ONETOUCH VERIO) w/Device KIT 1 kit by Does not apply route as needed. 1 kit 0  . cholecalciferol (VITAMIN D) 1000 units tablet  Take 1 tablet (1,000 Units total) by mouth daily. 30 tablet 11  . diclofenac sodium (VOLTAREN) 1 % GEL Apply 2 g topically 4 (four) times daily as needed. 100 g 4  . fish oil-omega-3 fatty acids 1000 MG capsule Take 1 g by mouth every 30 (thirty) days.     . fluticasone (FLONASE) 50 MCG/ACT nasal spray Place 2 sprays into both nostrils daily as needed for rhinitis. 16 g 2  . hydrOXYzine (ATARAX/VISTARIL) 25 MG tablet Take 1 tablet (25 mg total) by mouth at bedtime. 20 tablet 0  . Hyprom-Naphaz-Polysorb-Zn Sulf (CLEAR EYES COMPLETE OP) Place 1 drop into both eyes daily as needed (dry eyes).     [facility] lisinopril-hydrochlorothiazide (PRINZIDE,ZESTORETIC) 20-25 MG tablet Take 1 tablet by mouth daily. 90 tablet 3  . loratadine (CLARITIN) 10 MG tablet Take 1 tablet (10 mg total) by mouth daily as needed for allergies. 30 tablet 3  . metFORMIN (GLUCOPHAGE) 500 MG tablet Take 1 tablet (500 mg total) by mouth daily with breakfast. 30 tablet 1  . Multiple Vitamin (MULTIVITAMIN WITH MINERALS) TABS tablet Take 1 tablet by mouth daily.    [facility] PROAIR HFA 108 (90 Base) MCG/ACT inhaler INHALE 2 PUFFS INTO THE LUNGS EVERY 4 HOURS AS NEEDED FOR WHEEZING OR SHORTNESS OF BREATH 1 Inhaler 0  . VITAMIN E PO Take 1 tablet by mouth daily.      No current facility-administered medications for this visit.     REVIEW OF SYSTEMS:   .10  Point review of Systems was done is negative except as noted above.   PHYSICAL EXAMINATION: ECOG PERFORMANCE STATUS: 1 - Symptomatic but completely ambulatory .BP (!) 169/69 (BP Location: Left Arm, Patient Position: Sitting) Comment: Notified Nurse of BP  Pulse 78   Temp 98 F (36.7 C) (Oral)   Resp 18   Ht 5' 3"  (1.6 m)   Wt 192 lb 6.4 oz (87.3 kg)   SpO2 99%   BMI 34.08 kg/m  . GENERAL:alert, in no acute distress and comfortable SKIN: no acute rashes, no significant lesions EYES: conjunctiva are pink and non-injected, sclera anicteric OROPHARYNX: MMM, no exudates, no oropharyngeal erythema or ulceration NECK: supple, no JVD LYMPH:  no palpable lymphadenopathy in the cervical, axillary or inguinal regions LUNGS: clear to auscultation b/l with normal respiratory effort HEART: regular rate & rhythm ABDOMEN:  normoactive bowel sounds , non tender, not distended. Extremity: no pedal edema PSYCH: alert & oriented x 3 with fluent speech NEURO: no focal motor/sensory deficits   LABORATORY DATA:  . CBC Latest Ref Rng & Units 07/22/2017 05/06/2017 01/29/2017  WBC 3.4 - 10.8 x10E3/uL 7.4 7.9 8.2  Hemoglobin 11.1 - 15.9 g/dL 12.4 12.4 12.0  Hematocrit 34.0 - 46.6 % 38.5 39.9 38.6  Platelets 150 - 379 x10E3/uL 195 146 154   . CBC    Component Value Date/Time   WBC 9.9 08/11/2017 1100   RBC 4.90 08/11/2017 1100   HGB 14.3 08/11/2017 1100   HGB 12.4 07/22/2017 1055   HGB 12.0 01/29/2017 1106   HCT 44.0 08/11/2017 1100   HCT 38.5 07/22/2017 1055   HCT 38.6 01/29/2017 1106   PLT 170 08/11/2017 1100   PLT 195 07/22/2017 1055   MCV 89.8 08/11/2017 1100   MCV 88 07/22/2017 1055   MCV 93.0 01/29/2017 1106   MCH 29.2 08/11/2017 1100   MCHC 32.5 08/11/2017 1100   RDW 14.8 08/11/2017 1100   RDW 14.9 07/22/2017 1055   RDW 14.0 01/29/2017 1106  LYMPHSABS 2.2 08/11/2017 1100   LYMPHSABS 2.2 07/22/2017 1055   LYMPHSABS 2.2 01/29/2017 1106   MONOABS 0.8 08/11/2017 1100   MONOABS  0.7 01/29/2017 1106   EOSABS 0.1 08/11/2017 1100   EOSABS 0.1 07/22/2017 1055   BASOSABS 0.0 08/11/2017 1100   BASOSABS 0.0 07/22/2017 1055   BASOSABS 0.0 01/29/2017 1106   . CMP Latest Ref Rng & Units 08/11/2017 07/22/2017 05/06/2017  Glucose 65 - 99 mg/dL 149(H) 184(H) 231(H)  BUN 6 - 20 mg/dL 16 11 11   Creatinine 0.44 - 1.00 mg/dL 0.90 0.84 0.85  Sodium 135 - 145 mmol/L 140 138 137  Potassium 3.5 - 5.1 mmol/L 5.0 4.6 3.8  Chloride 101 - 111 mmol/L 103 98 101  CO2 22 - 32 mmol/L 26 28 28   Calcium 8.9 - 10.3 mg/dL 9.8 9.8 9.2  Total Protein 6.5 - 8.1 g/dL 8.4(H) 6.8 7.2  Total Bilirubin 0.3 - 1.2 mg/dL 1.3(H) 0.2 0.3  Alkaline Phos 38 - 126 U/L 104 113 106  AST 15 - 41 U/L 32 13 15  ALT 14 - 54 U/L 14 13 12    Component     Latest Ref Rng & Units 07/10/2017  CA 19-9     0 - 35 U/mL 15     RADIOGRAPHIC STUDIES: I have personally reviewed the radiological images as listed and agreed with the findings in the report.  CT C/A/P 08/06/2016: IMPRESSION: 1. Stable examination demonstrating postoperative changes of left hemi hepatectomy, chronic thickening of the gastric antrum (potentially related to prior radiation therapy, and stable 7 mm pulmonary nodule in the superior segment of the right lower lobe. No findings to suggest local recurrence of disease or definite metastatic disease elsewhere in the chest, abdomen or pelvis. 2. Aortic atherosclerosis, in addition to 3 vessel coronary artery disease. Please note that although the presence of coronary artery calcium documents the presence of coronary artery disease, the severity of this disease and any potential stenosis cannot be assessed on this non-gated CT examination. Assessment for potential risk factor modification, dietary therapy or pharmacologic therapy may be warranted, if clinically indicated. 3. Mild diffuse bronchial wall thickening with mild centrilobular emphysema ; imaging findings suggestive of underlying COPD. 4.  Additional incidental findings, as above.   CT C/A/P 01/20/2017 IMPRESSION: 1. Stable CT. Postoperative changes from left hepatectomy are again noted. No findings to suggest local tumor recurrence or metastatic disease. 2. Nonspecific pulmonary nodules in the right lower lobe remain stable. 3. Aortic Atherosclerosis (ICD10-I70.0). Coronary artery calcifications noted. 4.  Emphysema (ICD10-J43.9).    CT CAP 07/29/17 IMPRESSION: 1. Interval development of a 4.5 cm heterogeneously enhancing mass in the inferior right liver, highly concerning for metastatic disease. 2. Stable bilateral pulmonary nodules. 3.  Emphysema. (ICD10-J43.9) 4.  Aortic Atherosclerois (ICD10-170.0) 5. These results will be called to the ordering clinician or representative by the Radiologist Assistant, and communication documented in the PACS or zVision Dashboard.   ASSESSMENT & PLAN:   72 yo with multiple medical co-morbids  1) Intrahepatic invasive cholangiocarcinoma T2a N1 M0 (Stage IVA) with focal positive parenchymal margins and 1/2 LN positive. CT C/A/P on 04/03/2016 showed no evidence of cholangiocarcinoma local recurrence or metastases at this time. Patient  has completed her adjuvant concurrent chemo-radiation  on 05/25/2015 . She did overall quite well with treatment without any overt prohibitive toxicities. 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  Required treatment with high-dose steroids and dietary modifications.  Symptoms progressive to have  resolved.  Currently on PPI for overt symptoms. CT chest abdomen pelvis in March 2017 showed no evidence of disease progression. Patient was seen at [facility] by Dr. Dennison Nancy and was given the option of observation versus adjuvant gemcitabine. She had a followup at [facility] with a CT Chest abdomen pelvis on 10/16/2015 which showed no evidence of recurrent or metastatic disease. Patient has completed 4 months of Adjuvant  gemcitabine as per recommendation from [facility]. Rpt CT C/A/P  On 01/17/2016 -- shows no evidence of disease recurrence/progression. Rpt CT C/A/P  On 08/06/2016 -- shows no evidence of disease recurrence/progression. Rpt CT C/A/P on 01/20/17 shows no evidence of local recurrence or metastatic disease  Rpt CT C/A/P on 07/29/17 shows heterogeneously enhancing mass in the inferior right liver, highly concerning for metastatic Disease. Otherwise scan was stable.   Plan  -I discussed with the pt her CT CAP from 07/29/17 which showed heterogeneously enhancing mass in the inferior right liver. Otherwise scan was stable.  -Physical exam was unremarkable.  -I recommend a liver lesion biopsy to etiology of rt hepatic mass. Pt shared concern as her prior biopsies came back as a negative. I will follow up with her as soon as results are in.  -She was emotional with concern about possibility of recurrent cholangiocarcinoma and treatment.  -I will refer her back to [place] if needed based on pathology results -I discussed her family support and taking hydroxyzine for her anxiety as needed.   2) Smoking and marijuana use -I again counseled on smoking and marijuana cessation, she is working on quitting.   3) Mild thrombocytopenia-resolved.     US guided liver biopsy of new liver lesion in 3-4 days RTC with Dr Irene Limbo 3-4 days post liver lesion biopsy   Sullivan Lone MD MS AAHIVMS [facility] Hematology/Oncology Physician [place]  (Office):       [phone] (Work cell):  [phone] (Fax):           [phone]  This document serves as a record of services personally performed by Sullivan Lone, MD. It was created on his behalf by Joslyn Devon, a trained medical scribe. The creation of this record is based on the scribe's personal observations and the provider's statements to them.    .I have reviewed the above documentation for accuracy and completeness, and I agree with the above.  Brunetta Genera MD MS

## 2017-07-31 ENCOUNTER — Other Ambulatory Visit: Payer: Self-pay | Admitting: Family Medicine

## 2017-07-31 DIAGNOSIS — F411 Generalized anxiety disorder: Secondary | ICD-10-CM

## 2017-07-31 MED ORDER — HYDROXYZINE HCL 25 MG PO TABS: 25.0000 mg | ORAL_TABLET | Freq: Every day | ORAL | 0 refills | Status: DC

## 2017-07-31 NOTE — Telephone Encounter (Signed)
Covering for Dr. Shawna Orleans this week.  I gave patient a call this evening to discuss her results and have placed order for hydroxyzine 25 mg tab prn at bedtime.    She was appreciative of the call and will try this for her nerves.

## 2017-08-01 MED FILL — hydrOXYzine HCL 25 MG TABS: 25 | 20 days supply | Qty: 20 | Fill #0

## 2017-08-04 ENCOUNTER — Other Ambulatory Visit: Payer: Self-pay | Admitting: Family Medicine

## 2017-08-04 DIAGNOSIS — M1A9XX0 Chronic gout, unspecified, without tophus (tophi): Secondary | ICD-10-CM

## 2017-08-05 ENCOUNTER — Inpatient Hospital Stay: Payer: Medicare HMO | Attending: Hematology | Admitting: Hematology

## 2017-08-05 ENCOUNTER — Encounter: Payer: Self-pay | Admitting: Hematology

## 2017-08-05 ENCOUNTER — Telehealth: Payer: Self-pay | Admitting: Hematology

## 2017-08-05 VITALS — BP 169/69 | HR 78 | Temp 98.0°F | Resp 18 | Ht 63.0 in | Wt 192.4 lb

## 2017-08-05 DIAGNOSIS — F17210 Nicotine dependence, cigarettes, uncomplicated: Secondary | ICD-10-CM | POA: Diagnosis not present

## 2017-08-05 DIAGNOSIS — J449 Chronic obstructive pulmonary disease, unspecified: Secondary | ICD-10-CM | POA: Diagnosis not present

## 2017-08-05 DIAGNOSIS — I10 Essential (primary) hypertension: Secondary | ICD-10-CM | POA: Insufficient documentation

## 2017-08-05 DIAGNOSIS — Z803 Family history of malignant neoplasm of breast: Secondary | ICD-10-CM | POA: Diagnosis not present

## 2017-08-05 DIAGNOSIS — E785 Hyperlipidemia, unspecified: Secondary | ICD-10-CM | POA: Diagnosis not present

## 2017-08-05 DIAGNOSIS — F418 Other specified anxiety disorders: Secondary | ICD-10-CM | POA: Diagnosis not present

## 2017-08-05 DIAGNOSIS — F1290 Cannabis use, unspecified, uncomplicated: Secondary | ICD-10-CM | POA: Diagnosis not present

## 2017-08-05 DIAGNOSIS — E119 Type 2 diabetes mellitus without complications: Secondary | ICD-10-CM | POA: Diagnosis not present

## 2017-08-05 DIAGNOSIS — Z923 Personal history of irradiation: Secondary | ICD-10-CM | POA: Insufficient documentation

## 2017-08-05 DIAGNOSIS — Z79899 Other long term (current) drug therapy: Secondary | ICD-10-CM | POA: Diagnosis not present

## 2017-08-05 DIAGNOSIS — K219 Gastro-esophageal reflux disease without esophagitis: Secondary | ICD-10-CM | POA: Insufficient documentation

## 2017-08-05 DIAGNOSIS — Z800 Family history of malignant neoplasm of digestive organs: Secondary | ICD-10-CM | POA: Insufficient documentation

## 2017-08-05 DIAGNOSIS — G4730 Sleep apnea, unspecified: Secondary | ICD-10-CM | POA: Insufficient documentation

## 2017-08-05 DIAGNOSIS — Z9221 Personal history of antineoplastic chemotherapy: Secondary | ICD-10-CM | POA: Diagnosis not present

## 2017-08-05 DIAGNOSIS — C221 Intrahepatic bile duct carcinoma: Secondary | ICD-10-CM | POA: Diagnosis present

## 2017-08-05 DIAGNOSIS — Z7982 Long term (current) use of aspirin: Secondary | ICD-10-CM | POA: Diagnosis not present

## 2017-08-05 DIAGNOSIS — K769 Liver disease, unspecified: Secondary | ICD-10-CM

## 2017-08-05 DIAGNOSIS — Z9989 Dependence on other enabling machines and devices: Secondary | ICD-10-CM | POA: Diagnosis not present

## 2017-08-05 DIAGNOSIS — M1990 Unspecified osteoarthritis, unspecified site: Secondary | ICD-10-CM | POA: Insufficient documentation

## 2017-08-05 DIAGNOSIS — K30 Functional dyspepsia: Secondary | ICD-10-CM | POA: Diagnosis not present

## 2017-08-05 DIAGNOSIS — I700 Atherosclerosis of aorta: Secondary | ICD-10-CM | POA: Diagnosis not present

## 2017-08-05 DIAGNOSIS — I2510 Atherosclerotic heart disease of native coronary artery without angina pectoris: Secondary | ICD-10-CM | POA: Diagnosis not present

## 2017-08-05 NOTE — Telephone Encounter (Signed)
Unable to schedule f/u - US liver biopsy needs to be scheduled - will contact patient when appts are scheduled .

## 2017-08-05 NOTE — Telephone Encounter (Signed)
Scans discussed. US guided Bx setup. thx GK

## 2017-08-08 ENCOUNTER — Other Ambulatory Visit: Payer: Self-pay | Admitting: Radiology

## 2017-08-11 ENCOUNTER — Ambulatory Visit (HOSPITAL_COMMUNITY)
Admission: RE | Admit: 2017-08-11 | Discharge: 2017-08-11 | Disposition: A | Discharge status: Home / Self Care | Payer: Medicare HMO | Source: Ambulatory Visit | Attending: Hematology | Admitting: Hematology

## 2017-08-11 ENCOUNTER — Encounter (HOSPITAL_COMMUNITY): Payer: Self-pay

## 2017-08-11 DIAGNOSIS — E785 Hyperlipidemia, unspecified: Secondary | ICD-10-CM | POA: Diagnosis not present

## 2017-08-11 DIAGNOSIS — M109 Gout, unspecified: Secondary | ICD-10-CM | POA: Insufficient documentation

## 2017-08-11 DIAGNOSIS — K769 Liver disease, unspecified: Secondary | ICD-10-CM | POA: Diagnosis present

## 2017-08-11 DIAGNOSIS — H8110 Benign paroxysmal vertigo, unspecified ear: Secondary | ICD-10-CM | POA: Insufficient documentation

## 2017-08-11 DIAGNOSIS — M1990 Unspecified osteoarthritis, unspecified site: Secondary | ICD-10-CM | POA: Insufficient documentation

## 2017-08-11 DIAGNOSIS — C227 Other specified carcinomas of liver: Secondary | ICD-10-CM | POA: Diagnosis not present

## 2017-08-11 DIAGNOSIS — K219 Gastro-esophageal reflux disease without esophagitis: Secondary | ICD-10-CM | POA: Insufficient documentation

## 2017-08-11 DIAGNOSIS — Z79899 Other long term (current) drug therapy: Secondary | ICD-10-CM | POA: Diagnosis not present

## 2017-08-11 DIAGNOSIS — Z90710 Acquired absence of both cervix and uterus: Secondary | ICD-10-CM | POA: Insufficient documentation

## 2017-08-11 DIAGNOSIS — I10 Essential (primary) hypertension: Secondary | ICD-10-CM | POA: Diagnosis not present

## 2017-08-11 DIAGNOSIS — F419 Anxiety disorder, unspecified: Secondary | ICD-10-CM | POA: Diagnosis not present

## 2017-08-11 DIAGNOSIS — Z7984 Long term (current) use of oral hypoglycemic drugs: Secondary | ICD-10-CM | POA: Diagnosis not present

## 2017-08-11 DIAGNOSIS — Z9221 Personal history of antineoplastic chemotherapy: Secondary | ICD-10-CM | POA: Insufficient documentation

## 2017-08-11 DIAGNOSIS — K589 Irritable bowel syndrome without diarrhea: Secondary | ICD-10-CM | POA: Insufficient documentation

## 2017-08-11 DIAGNOSIS — Z7982 Long term (current) use of aspirin: Secondary | ICD-10-CM | POA: Diagnosis not present

## 2017-08-11 DIAGNOSIS — G4733 Obstructive sleep apnea (adult) (pediatric): Secondary | ICD-10-CM | POA: Insufficient documentation

## 2017-08-11 DIAGNOSIS — Z9889 Other specified postprocedural states: Secondary | ICD-10-CM | POA: Diagnosis not present

## 2017-08-11 DIAGNOSIS — E119 Type 2 diabetes mellitus without complications: Secondary | ICD-10-CM | POA: Insufficient documentation

## 2017-08-11 DIAGNOSIS — I272 Pulmonary hypertension, unspecified: Secondary | ICD-10-CM | POA: Insufficient documentation

## 2017-08-11 DIAGNOSIS — F329 Major depressive disorder, single episode, unspecified: Secondary | ICD-10-CM | POA: Diagnosis not present

## 2017-08-11 DIAGNOSIS — C221 Intrahepatic bile duct carcinoma: Secondary | ICD-10-CM | POA: Diagnosis not present

## 2017-08-11 DIAGNOSIS — Z923 Personal history of irradiation: Secondary | ICD-10-CM | POA: Insufficient documentation

## 2017-08-11 DIAGNOSIS — J449 Chronic obstructive pulmonary disease, unspecified: Secondary | ICD-10-CM | POA: Insufficient documentation

## 2017-08-11 LAB — COMPREHENSIVE METABOLIC PANEL
ALT: 14 U/L (ref 14–54)
AST: 32 U/L (ref 15–41)
Albumin: 3.8 g/dL (ref 3.5–5.0)
Alkaline Phosphatase: 104 U/L (ref 38–126)
Anion gap: 11 (ref 5–15)
BUN: 16 mg/dL (ref 6–20)
CO2: 26 mmol/L (ref 22–32)
Calcium: 9.8 mg/dL (ref 8.9–10.3)
Chloride: 103 mmol/L (ref 101–111)
Creatinine, Ser: 0.9 mg/dL (ref 0.44–1.00)
GFR calc Af Amer: >60 mL/min (ref >60)
GFR calc non Af Amer: >60 mL/min (ref >60)
Glucose, Bld: 149 mg/dL — ABNORMAL HIGH (ref 65–99)
Potassium: 5 mmol/L (ref 3.5–5.1)
Sodium: 140 mmol/L (ref 135–145)
Total Bilirubin: 1.3 mg/dL — ABNORMAL HIGH (ref 0.3–1.2)
Total Protein: 8.4 g/dL — ABNORMAL HIGH (ref 6.5–8.1)

## 2017-08-11 LAB — CBC WITH DIFFERENTIAL/PLATELET
Basophils Absolute: 0 10*3/uL (ref 0.0–0.1)
Basophils Relative: 0 %
Eosinophils Absolute: 0.1 10*3/uL (ref 0.0–0.7)
Eosinophils Relative: 1 %
HCT: 44 % (ref 36.0–46.0)
Hemoglobin: 14.3 g/dL (ref 12.0–15.0)
Lymphocytes Relative: 23 %
Lymphs Abs: 2.2 10*3/uL (ref 0.7–4.0)
MCH: 29.2 pg (ref 26.0–34.0)
MCHC: 32.5 g/dL (ref 30.0–36.0)
MCV: 89.8 fL (ref 78.0–100.0)
Monocytes Absolute: 0.8 10*3/uL (ref 0.1–1.0)
Monocytes Relative: 8 %
Neutro Abs: 6.8 10*3/uL (ref 1.7–7.7)
Neutrophils Relative %: 68 %
Platelets: 170 10*3/uL (ref 150–400)
RBC: 4.9 MIL/uL (ref 3.87–5.11)
RDW: 14.8 % (ref 11.5–15.5)
WBC: 9.9 10*3/uL (ref 4.0–10.5)

## 2017-08-11 LAB — GLUCOSE, CAPILLARY: Glucose-Capillary: 138 mg/dL — ABNORMAL HIGH (ref 65–99)

## 2017-08-11 LAB — PROTIME-INR
INR: 0.98
Prothrombin Time: 12.9 seconds (ref 11.4–15.2)

## 2017-08-11 MED ORDER — LIDOCAINE HCL 1 % IJ SOLN
INTRAMUSCULAR | Status: AC
  Filled 2017-08-11: qty 20

## 2017-08-11 MED ORDER — LIDOCAINE HCL (PF) 1 % IJ SOLN
INTRAMUSCULAR | Status: AC | PRN
  Administered 2017-08-11: 30 mL

## 2017-08-11 MED ORDER — FENTANYL CITRATE (PF) 100 MCG/2ML IJ SOLN
INTRAMUSCULAR | Status: AC | PRN
  Administered 2017-08-11 (×2): 50 ug via INTRAVENOUS

## 2017-08-11 MED ORDER — HYDROCODONE-ACETAMINOPHEN 5-325 MG PO TABS: 1.0000 | ORAL_TABLET | ORAL | Status: DC | PRN

## 2017-08-11 MED ORDER — FENTANYL CITRATE (PF) 100 MCG/2ML IJ SOLN
INTRAMUSCULAR | Status: AC
  Filled 2017-08-11: qty 2

## 2017-08-11 MED ORDER — MIDAZOLAM HCL 2 MG/2ML IJ SOLN
INTRAMUSCULAR | Status: AC
  Filled 2017-08-11: qty 2

## 2017-08-11 MED ORDER — MIDAZOLAM HCL 2 MG/2ML IJ SOLN
INTRAMUSCULAR | Status: AC | PRN
  Administered 2017-08-11 (×2): 1 mg via INTRAVENOUS

## 2017-08-11 MED ORDER — SODIUM CHLORIDE 0.9 % IV SOLN
INTRAVENOUS | Status: DC
  Administered 2017-08-11: 11:00:00 via INTRAVENOUS

## 2017-08-11 NOTE — H&P (Signed)
Referring Physician(s): Brunetta Genera  Supervising Physician: Arne Cleveland  Patient Status:  [facility]  Chief Complaint:  "I'm here for a liver biopsy"  Subjective:  Pt familiar to IR service from prior left hepatic lobe lesion biopsy in 2014 revealing  bile duct adenoma and repeat biopsy in 2016 secondary to enlargement which yielded atypical cells. 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  She is status post chemoradiation.  She has also had Port-A-Cath placed in 2017 and removed in 2018.  Recent imaging has shown interval development of a 4.5 cm enhancing mass in the inferior right liver concerning for metastatic disease.  She presents today for image guided biopsy for further evaluation.  She currently denies fever, headache, chest pain, dyspnea, back pain, nausea, vomiting or bleeding.  She does continue to smoke and has occasional cough as well as abdominal discomfort. Past Medical History:  Diagnosis Date  . Anxiety   . Benign positional vertigo   . Cancer ([place]) 02/09/15   intrahepatic cholangiocarcinoma  . COPD (chronic obstructive pulmonary disease) ([place])   . Depression   . Diabetes mellitus without complication ([place])   . Early cataracts, bilateral 10/13   Optho, Dr Gershon Crane  . Echocardiogram findings abnormal, without diagnosis 10/10   10/10: mild pulm HTN, EF 60-65%, mild LVH, moderate aortic regurg  . GERD (gastroesophageal reflux disease)    I have "acid reflux"  . Gout   . HLD (hyperlipidemia)   . HTN, goal below 130/80   . Irritable bowel syndrome   . Obesity, Class III, BMI 40-49.9 (morbid obesity) ([place])   . Obstructive sleep apnea    wears cpap  . Osteoarthritis (arthritis due to wear and tear of joints)    also gout  . Restrictive lung disease    Past Surgical History:  Procedure Laterality Date  . ABDOMINAL HYSTERECTOMY    . ESOPHAGOGASTRODUODENOSCOPY N/A 06/15/2015   Procedure: ESOPHAGOGASTRODUODENOSCOPY (EGD);   Surgeon: Gatha Mayer, MD;  Location[facility];  Service: Endoscopy;  Laterality: N/A;  . IR REMOVAL TUN ACCESS W/ PORT W/O FL MOD SED  11/11/2016  . LIVER BIOPSY    . OPEN PARTIAL HEPATECTOMY  Left 02/09/15  . PARTIAL HYSTERECTOMY    . ROTATOR CUFF REPAIR       L rotator cuff repair 11/07-[place] - 12/7/200  . TONSILLECTOMY      Allergies: Other  Medications: Prior to Admission medications   Medication Sig Start Date End Date Taking? Authorizing Provider  alendronate (FOSAMAX) 70 MG tablet TAKE 1 TABLET  ONCE A WEEK. 01/03/17  Yes Orson Eva J, DO  allopurinol (ZYLOPRIM) 100 MG tablet TAKE 1 TABLET EVERY DAY 08/04/17  Yes Orson Eva J, DO  amLODipine (NORVASC) 10 MG tablet Take 1 tablet (10 mg total) by mouth daily. 01/03/17  Yes Bufford Lope, DO  aspirin EC 81 MG tablet Take 1 tablet (81 mg total) by mouth daily. 01/03/17 01/03/18 Yes Orson Eva J, DO  atenolol (TENORMIN) 25 MG tablet Take 1 tablet (25 mg total) by mouth daily. 01/03/17  Yes Orson Eva J, DO  Blood Glucose Monitoring Suppl (ONETOUCH VERIO) w/Device KIT 1 kit by Does not apply route as needed. 07/18/17  Yes Orson Eva J, DO  cholecalciferol (VITAMIN D) 1000 units tablet Take 1 tablet (1,000 Units total) by mouth daily. 01/03/17  Yes Orson Eva J, DO  diclofenac sodium (VOLTAREN) 1 % GEL Apply 2 g topically 4 (four) times daily as needed. 08/05/16  Yes Haney, Alyssa A, MD  fluticasone (FLONASE) 50 MCG/ACT nasal spray Place 2 sprays into both nostrils daily as needed for rhinitis. 04/17/16  Yes Haney, Alyssa A, MD  hydrOXYzine (ATARAX/VISTARIL) 25 MG tablet Take 1 tablet (25 mg total) by mouth at bedtime. 07/31/17  Yes Lovenia Kim, MD  Hyprom-Naphaz-Polysorb-Zn Sulf (CLEAR EYES COMPLETE OP) Place 1 drop into both eyes daily as needed (dry eyes).    Yes [provider]  lisinopril-hydrochlorothiazide (PRINZIDE,ZESTORETIC) 20-25 MG tablet Take 1 tablet by mouth daily. 01/03/17  Yes Bufford Lope, DO  loratadine (CLARITIN) 10  MG tablet Take 1 tablet (10 mg total) by mouth daily as needed for allergies. 09/23/12  Yes Waldemar Dickens, MD  metFORMIN (GLUCOPHAGE) 500 MG tablet Take 1 tablet (500 mg total) by mouth daily with breakfast. 07/10/17  Yes Bufford Lope, DO  Multiple Vitamin (MULTIVITAMIN WITH MINERALS) TABS tablet Take 1 tablet by mouth daily.   Yes [provider]  VITAMIN E PO Take 1 tablet by mouth daily.    Yes [provider]  fish oil-omega-3 fatty acids 1000 MG capsule Take 1 g by mouth every 30 (thirty) days.     [provider]  PROAIR HFA 108 (90 Base) MCG/ACT inhaler INHALE 2 PUFFS INTO THE LUNGS EVERY 4 HOURS AS NEEDED FOR WHEEZING OR SHORTNESS OF BREATH 07/18/15   Melancon, York Ram, MD     Vital Signs: BP (!) 161/64 (BP Location: Left Arm)   Pulse 70   Temp 98.4 F (36.9 C) (Oral)   Resp 18   SpO2 95%   Physical Exam awake, alert.  Chest with distant breath sounds bilaterally.  Heart with regular rate and rhythm.  Abdomen soft, positive bowel sounds, mild right upper quadrant/epigastric tenderness to palpation; trace pretibial edema bilaterally.  Imaging: No results found.  Labs:  CBC: Recent Labs    01/29/17 1106 05/06/17 1214 07/22/17 1055 08/11/17 1100  WBC 8.2 7.9 7.4 9.9  HGB 12.0 12.4 12.4 14.3  HCT 38.6 39.9 38.5 44.0  PLT 154 146 195 170    COAGS: Recent Labs    11/11/16 1006 08/11/17 1100  INR 0.99 0.98    BMP: Recent Labs    10/29/16 1328 01/20/17 1127 01/29/17 1106 05/06/17 1214 07/22/17 1055  NA 138  --  140 137 138  K 4.4  --  4.1 3.8 4.6  CL  --   --   --  101 98  CO2 26  --  _0 GLUCOSE 115  --  144* 231* 184*  BUN 16.5 20.1 15._1 CALCIUM 9.7  --  9.8 9.2 9.8  CREATININE 0.8 0.8 0.8 0.85 0.84  GFRNONAA  --   --   --  >60 70  GFRAA  --   --   --  >60 81    LIVER FUNCTION TESTS: Recent Labs    10/29/16 1328 01/29/17 1106 05/06/17 1214 07/22/17 1055  BILITOT 0.38 0.28 0.3 0.2  AST _2 ALT  _3 ALKPHOS 109 109 106 113  PROT 7.1 7.5 7.2 6.8  ALBUMIN 3.3* 3.3* 3.2* 3.7    Assessment and Plan: Pt with hx 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  She is status post chemoradiation.  She has also had Port-A-Cath placed in 2017 and removed in 2018.  Recent imaging has shown interval development of a 4.5 cm enhancing mass in the inferior right liver concerning  for metastatic disease.  She presents today for image guided biopsy for further evaluation. Risks and benefits discussed with the patient including, but not limited to bleeding, infection, damage to adjacent structures or low yield requiring additional tests.  All of the patient's questions were answered, patient is agreeable to proceed. Consent signed and in chart.     Electronically Signed: D. Rowe Robert, PA-C 08/11/2017, 12:20 PM   I spent a total of 25 minutes at the the patient's bedside AND on the patient's hospital floor or unit, greater than 50% of which was counseling/coordinating care for image guided liver lesion biopsy

## 2017-08-11 NOTE — Procedures (Signed)
  Procedure: US liver lesion core biopsy 18g x3 EBL:   minimal Complications:  none immediate  See full dictation in [facility] PACS.  D. Sandi Towe MD Main # [phone] Pager  [phone]    

## 2017-08-11 NOTE — Discharge Instructions (Signed)
Liver Biopsy, Care After °Refer to this sheet in the next few weeks. These instructions provide you with information on caring for yourself after your procedure. Your health care provider may also give you more specific instructions. Your treatment has been planned according to current medical practices, but problems sometimes occur. Call your health care provider if you have any problems or questions after your procedure. °What can I expect after the procedure? °After your procedure, it is typical to have the following: °· A small amount of discomfort in the area where the biopsy was done and in the right shoulder or shoulder blade. °· A small amount of bruising around the area where the biopsy was done and on the skin over the liver. °· Sleepiness and fatigue for the rest of the day. ° °Follow these instructions at home: °· Rest at home for 1-2 days or as directed by your health care provider. °· Have a friend or family member stay with you for at least 24 hours. °· Because of the medicines used during the procedure, you should not do the following things in the first 24 hours: °? Drive. °? Use machinery. °? Be responsible for the care of other people. °? Sign legal documents. °? Take a bath or shower. °· There are many different ways to close and cover an incision, including stitches, skin glue, and adhesive strips. Follow your health care provider's instructions on: °? Incision care. °? Bandage (dressing) changes and removal. °? Incision closure removal. °· Do not drink alcohol in the first week. °· Do not lift more than 5 pounds or play contact sports for 2 weeks after this test. °· Take medicines only as directed by your health care provider. Do not take medicine containing aspirin or non-steroidal anti-inflammatory medicines such as ibuprofen for 1 week after this test. °· It is your responsibility to get your test results. °Contact a health care provider if: °· You have increased bleeding from an incision  that results in more than a small spot of blood. °· You have redness, swelling, or increasing pain in any incisions. °· You notice a discharge or a bad smell coming from any of your incisions. °· You have a fever or chills. °Get help right away if: °· You develop swelling, bloating, or pain in your abdomen. °· You become dizzy or faint. °· You develop a rash. °· You are nauseous or vomit. °· You have difficulty breathing, feel short of breath, or feel faint. °· You develop chest pain. °· You have problems with your speech or vision. °· You have trouble balancing or moving your arms or legs. °This information is not intended to replace advice given to you by your health care provider. Make sure you discuss any questions you have with your health care provider. °Document Released: 10/12/2004 Document Revised: 08/31/2015 Document Reviewed: 05/21/2013 °Elsevier Interactive Patient Education © 2018 [employer] °Moderate Conscious Sedation, Adult, Care After °These instructions provide you with information about caring for yourself after your procedure. Your health care provider may also give you more specific instructions. Your treatment has been planned according to current medical practices, but problems sometimes occur. Call your health care provider if you have any problems or questions after your procedure. °What can I expect after the procedure? °After your procedure, it is common: °· To feel sleepy for several hours. °· To feel clumsy and have poor balance for several hours. °· To have poor judgment for several hours. °· To vomit if you eat   too soon. ° °Follow these instructions at home: °For at least 24 hours after the procedure: ° °· Do not: °? Participate in activities where you could fall or become injured. °? Drive. °? Use heavy machinery. °? Drink alcohol. °? Take sleeping pills or medicines that cause drowsiness. °? Make important decisions or sign legal documents. °? Take care of children on your  own. °· Rest. °Eating and drinking °· Follow the diet recommended by your health care provider. °· If you vomit: °? Drink water, juice, or soup when you can drink without vomiting. °? Make sure you have little or no nausea before eating solid foods. °General instructions °· Have a responsible adult stay with you until you are awake and alert. °· Take over-the-counter and prescription medicines only as told by your health care provider. °· If you smoke, do not smoke without supervision. °· Keep all follow-up visits as told by your health care provider. This is important. °Contact a health care provider if: °· You keep feeling nauseous or you keep vomiting. °· You feel light-headed. °· You develop a rash. °· You have a fever. °Get help right away if: °· You have trouble breathing. °This information is not intended to replace advice given to you by your health care provider. Make sure you discuss any questions you have with your health care provider. °Document Released: 01/13/2013 Document Revised: 08/28/2015 Document Reviewed: 07/15/2015 °[employer] Interactive Patient Education © 2018 [employer] ° °

## 2017-08-13 ENCOUNTER — Ambulatory Visit: Payer: Medicare HMO | Admitting: Hematology

## 2017-08-13 ENCOUNTER — Other Ambulatory Visit: Payer: Medicare HMO

## 2017-08-13 NOTE — Progress Notes (Signed)
[name]    HEMATOLOGY/ONCOLOGY CLINIC NOTE  Date of Service: 08/14/17   Patient Care Team: Bufford Lope, DO as PCP - General Melancon, York Ram, MD (Inactive) as Resident (Family Medicine)  CHIEF COMPLAINTS/PURPOSE OF CONSULTATION:   F/u for Cholangiocarcinoma   DIAGNOSIS  Stage IVA (T2a, pN1, M0) Intrahepatic Cholangiocarcinoma  Current Treatment  Active Surveillance - concern for intrahepatic recurrence.  Previous treatment 02/09/15 L lobe of liver partial hepatectomy segments 2, 3, 4a, 4b invasive cholangiocarcinoma, moderately differentiated. Tumor focally extends to the parenchymal margin of resection. Uninvolved liver parenchyma with steatohepatitis and associated periportal and centrilobular pericellular fibrosis. Scattered von Meyenburg complexes. One hepatic artery LN with metastatic cholangiocarcinoma. Fibroadipose tissue and nerve negative for malignancy. Gallbladder negative for malignancy. One LN negative for malignancy. T2aN1 invasive cholangiocarcinoma, moderately differentiated, with positive hepatic parenchymal margin, and 1/2 nodes positive for tumor involvement.  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  Completed adjuvant gemcitabine x 4 months  Interval History   Bonnie Peterson here for her scheduled followup and to discuss her recent surveillance scan. The patient's last visit with [facility] on 08/05/17. She is accompanied today by her daughter, granddaughter, and her great-granddaughter. The pt reports that she is doing well overall.   The pt reports that she had some abdominal pain in the last week at the site of her bx. She also notes that the Hydroxyzine is helping to control her anxiety well.   Of note since the patient's last visit, pt has had Liver Needle/core bx completed on  08/12/17 with results revealing adenocarcinoma.  Lab results (08/11/17) of CBC, CMP is as follows: all values are WNL except for Glucose at 149, Total Protein at 8.4, Total Bilirubin at 1.3.   On review of systems, pt reports well controlled anxiety, bx site sorenes, and denies any other symptoms.    MEDICAL HISTORY:  Past Medical History:  Diagnosis Date  . Anxiety   . Benign positional vertigo   . Cancer ([place] 02/09[place]15   intrahepatic cholangiocarcinoma  . COPD (chronic obstructive pulmonary disease) ([facility]   . Depression   . Diabetes mellitus without complication ([facility]   . Early cataracts, bilateral 10/13   Optho, Dr SGershon Crane . Echocardiogram findings abnormal, without diagnosis 10/10   10/10: mild pulm HTN, EF 60-65%, mild LVH, moderate aortic regurg  . GERD (gastroesophageal reflux disease)    I have "acid reflux"  . Gout   . HLD (hyperlipidemia)   . HTN, goal below 130/80   . Irritable bowel syndrome   . Obesity, Class III, BMI 40-49.9 (morbid obesity) ([place]   . Obstructive sleep apnea    wears cpap  . Osteoarthritis (arthritis due to wear and tear of joints)    also gout  . Restrictive lung disease    SURGICAL HISTORY: Past Surgical History:  Procedure Laterality Date  . ABDOMINAL HYSTERECTOMY    . ESOPHAGOGASTRODUODENOSCOPY N/A 06/15/2015   Procedure: ESOPHAGOGASTRODUODENOSCOPY (EGD);  Surgeon: CGatha Mayer MD;  [facility];  Service: Endoscopy;  Laterality: N/A;  . IR REMOVAL TUN ACCESS W/ PORT W/O FL MOD SED  11/11/2016  . LIVER BIOPSY    . OPEN PARTIAL HEPATECTOMY  Left 02/09/15  . PARTIAL HYSTERECTOMY    . ROTATOR CUFF REPAIR       L rotator cuff repair 11/07-[facility] -  12/7/200  . TONSILLECTOMY      SOCIAL HISTORY: Social History   Socioeconomic History  . Marital status: Divorced    Spouse name: Not on file  . Number of children: 4  . Years of education: Not on file  . Highest education level: Not on file  Occupational History  . Occupation:  unemployed  Social Needs  . Financial resource strain: Not on file  . Food insecurity:    Worry: Not on file    Inability: Not on file  . Transportation needs:    Medical: Not on file    Non-medical: Not on file  Tobacco Use  . Smoking status: Current Some Day Smoker    Packs/day: 0.50    Years: 49.00    Pack years: 24.50    Types: Cigarettes  . Smokeless tobacco: Never Used  Substance and Sexual Activity  . Alcohol use: Yes    Alcohol/week: 0.0 oz    Comment: occasionally  . Drug use: Yes    Types: Marijuana  . Sexual activity: Never  Lifestyle  . Physical activity:    Days per week: Not on file    Minutes per session: Not on file  . Stress: Not on file  Relationships  . Social connections:    Talks on phone: Not on file    Gets together: Not on file    Attends religious service: Not on file    Active member of club or organization: Not on file    Attends meetings of clubs or organizations: Not on file    Relationship status: Not on file  . Intimate partner violence:    Fear of current or ex partner: Not on file    Emotionally abused: Not on file    Physically abused: Not on file    Forced sexual activity: Not on file  Other Topics Concern  . 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 Breast cancer Mother   . Hypertension Mother   . Coronary artery disease Mother   . Diabetes type II Mother   . Rheum arthritis Mother   . Diabetes type II Sister   . Pancreatic cancer Sister     ALLERGIES:  is allergic to other.  MEDICATIONS:  Current Outpatient Medications  Medication Sig Dispense Refill  .  alendronate (FOSAMAX) 70 MG tablet TAKE 1 TABLET  ONCE A WEEK. 12 tablet 2  . allopurinol (ZYLOPRIM) 100 MG tablet TAKE 1 TABLET EVERY DAY 90 tablet 0  . amLODipine (NORVASC) 10 MG tablet Take 1 tablet (10 mg total) by mouth daily. 90 tablet 1  . aspirin EC 81 MG tablet Take 1 tablet (81 mg total) by mouth daily. 90 tablet 2  . atenolol (TENORMIN) 25 MG tablet Take 1 tablet (25 mg total) by mouth daily. 90 tablet 3  . Blood Glucose Monitoring Suppl (ONETOUCH VERIO) w/Device KIT 1 kit by Does not apply route as needed. 1 kit 0  . cholecalciferol (VITAMIN D) 1000 units tablet Take 1 tablet (1,000 Units total) by mouth daily. 30 tablet 11  .  diclofenac sodium (VOLTAREN) 1 % GEL Apply 2 g topically 4 (four) times daily as needed. 100 g 4  . fish oil-omega-3 fatty acids 1000 MG capsule Take 1 g by mouth every 30 (thirty) days.     . fluticasone (FLONASE) 50 MCG/ACT nasal spray Place 2 sprays into both nostrils daily as needed for rhinitis. 16 g 2  . hydrOXYzine (ATARAX/VISTARIL) 25 MG tablet Take 1 tablet (25 mg total) by mouth at bedtime. 20 tablet 0  . Hyprom-Naphaz-Polysorb-Zn Sulf (CLEAR EYES COMPLETE OP) Place 1 drop into both eyes daily as needed (dry eyes).     [name] lisinopril-hydrochlorothiazide (PRINZIDE,ZESTORETIC) 20-25 MG tablet Take 1 tablet by mouth daily. 90 tablet 3  . loratadine (CLARITIN) 10 MG tablet Take 1 tablet (10 mg total) by mouth daily as needed for allergies. 30 tablet 3  . metFORMIN (GLUCOPHAGE) 500 MG tablet Take 1 tablet (500 mg total) by mouth daily with breakfast. 30 tablet 1  . Multiple Vitamin (MULTIVITAMIN WITH MINERALS) TABS tablet Take 1 tablet by mouth daily.    [name] PROAIR HFA 108 (90 Base) MCG/ACT inhaler INHALE 2 PUFFS INTO THE LUNGS EVERY 4 HOURS AS NEEDED FOR WHEEZING OR SHORTNESS OF BREATH 1 Inhaler 0  . VITAMIN E PO Take 1 tablet by mouth daily.      No current facility-administered medications for this visit.     REVIEW OF SYSTEMS:   A 10+ POINT REVIEW OF  SYSTEMS WAS OBTAINED including neurology, dermatology, psychiatry, cardiac, respiratory, lymph, extremities, GI, GU, Musculoskeletal, constitutional, breasts, reproductive, HEENT.  All pertinent positives are noted in the HPI.  All others are negative.    PHYSICAL EXAMINATION: ECOG PERFORMANCE STATUS: 1 - Symptomatic but completely ambulatory .BP (!) 161/58 (Patient Position: Sitting) Comment: Herbalist is aware  Pulse 62   Temp 97.9 F (36.6 C) (Oral)   Resp 17   Ht 5' 3"  (1.6 m)   Wt 193 lb 1.6 oz (87.6 kg)   SpO2 100%   BMI 34.21 kg/m   GENERAL:alert, in no acute distress and comfortable SKIN: no acute rashes, no significant lesions EYES: conjunctiva are pink and non-injected, sclera anicteric OROPHARYNX: MMM, no exudates, no oropharyngeal erythema or ulceration NECK: supple, no JVD LYMPH:  no palpable lymphadenopathy in the cervical, axillary or inguinal regions LUNGS: clear to auscultation b/l with normal respiratory effort HEART: regular rate & rhythm ABDOMEN:  normoactive bowel sounds , non tender, not distended. Extremity: no pedal edema PSYCH: alert & oriented x 3 with fluent speech NEURO: no focal motor/sensory deficits   LABORATORY DATA:  . CBC Latest Ref Rng & Units 08/11/2017 07/22/2017 05/06/2017  WBC 4.0 - 10.5 K/uL 9.9 7.4 7.9  Hemoglobin 12.0 - 15.0 g/dL 14.3 12.4 12.4  Hematocrit 36.0 - 46.0 % 44.0 38.5 39.9  Platelets 150 - 400 K/uL 170 195 146   . CBC    Component Value Date/Time   WBC 9.9 08/11/2017 1100   RBC 4.90 08/11/2017 1100   HGB 14.3 08/11/2017 1100   HGB 12.4 07/22/2017 1055   HGB 12.0 01/29/2017 1106   HCT 44.0 08/11/2017 1100   HCT 38.5 07/22/2017 1055   HCT 38.6 01/29/2017 1106   PLT 170 08/11/2017 1100   PLT 195 07/22/2017 1055   MCV 89.8 08/11/2017 1100   MCV 88 07/22/2017 1055   MCV 93.0 01/29/2017 1106   MCH 29.2 08/11/2017 1100   MCHC 32.5 08/11/2017 1100   RDW 14.8 08/11/2017 1100   RDW 14.9 07/22/2017 1055  RDW 14.0  01/29/2017 1106   LYMPHSABS 2.2 08/11/2017 1100   LYMPHSABS 2.2 07/22/2017 1055   LYMPHSABS 2.2 01/29/2017 1106   MONOABS 0.8 08/11/2017 1100   MONOABS 0.7 01/29/2017 1106   EOSABS 0.1 08/11/2017 1100   EOSABS 0.1 07/22/2017 1055   BASOSABS 0.0 08/11/2017 1100   BASOSABS 0.0 07/22/2017 1055   BASOSABS 0.0 01/29/2017 1106   . CMP Latest Ref Rng & Units 08/11/2017 07/22/2017 05/06/2017  Glucose 65 - 99 mg/dL 149(H) 184(H) 231(H)  BUN 6 - 20 mg/dL 16 11 11   Creatinine 0.44 - 1.00 mg/dL 0.90 0.84 0.85  Sodium 135 - 145 mmol/L 140 138 137  Potassium 3.5 - 5.1 mmol/L 5.0 4.6 3.8  Chloride 101 - 111 mmol/L 103 98 101  CO2 22 - 32 mmol/L 26 28 28   Calcium 8.9 - 10.3 mg/dL 9.8 9.8 9.2  Total Protein 6.5 - 8.1 g/dL 8.4(H) 6.8 7.2  Total Bilirubin 0.3 - 1.2 mg/dL 1.3(H) 0.2 0.3  Alkaline Phos 38 - 126 U/L 104 113 106  AST 15 - 41 U/L 32 13 15  ALT 14 - 54 U/L 14 13 12    Component     Latest Ref Rng & Units 07/10/2017  CA 19-9     0 - 35 U/mL 15   08/12/17 Bx    RADIOGRAPHIC STUDIES: I have personally reviewed the radiological images as listed and agreed with the findings in the report.  CT C/A/P 08/06/2016: IMPRESSION: 1. Stable examination demonstrating postoperative changes of left hemi hepatectomy, chronic thickening of the gastric antrum (potentially related to prior radiation therapy, and stable 7 mm pulmonary nodule in the superior segment of the right lower lobe. No findings to suggest local recurrence of disease or definite metastatic disease elsewhere in the chest, abdomen or pelvis. 2. Aortic atherosclerosis, in addition to 3 vessel coronary artery disease. Please note that although the presence of coronary artery calcium documents the presence of coronary artery disease, the severity of this disease and any potential stenosis cannot be assessed on this non-gated CT examination. Assessment for potential risk factor modification, dietary therapy or pharmacologic therapy may  be warranted, if clinically indicated. 3. Mild diffuse bronchial wall thickening with mild centrilobular emphysema ; imaging findings suggestive of underlying COPD. 4. Additional incidental findings, as above.   CT C/A/P 01/20/2017 IMPRESSION: 1. Stable CT. Postoperative changes from left hepatectomy are again noted. No findings to suggest local tumor recurrence or metastatic disease. 2. Nonspecific pulmonary nodules in the right lower lobe remain stable. 3. Aortic Atherosclerosis (ICD10-I70.0). Coronary artery calcifications noted. 4.  Emphysema (ICD10-J43.9).    CT CAP 07/29/17 IMPRESSION: 1. Interval development of a 4.5 cm heterogeneously enhancing mass in the inferior right liver, highly concerning for metastatic disease. 2. Stable bilateral pulmonary nodules. 3.  Emphysema. (ICD10-J43.9) 4.  Aortic Atherosclerois (ICD10-170.0) 5. These results will be called to the ordering clinician or representative by the Radiologist Assistant, and communication documented in the PACS or zVision Dashboard.   ASSESSMENT & PLAN:   72 yo with multiple medical co-morbids  1) Intrahepatic invasive cholangiocarcinoma T2a N1 M0 (Stage IVA) with focal positive parenchymal margins and 1/2 LN positive. CT C/A/P on 04/03/2016 showed no evidence of cholangiocarcinoma local recurrence or metastases at this time. Patient  has completed her adjuvant concurrent chemo-radiation  on 05/25/2015 . She did overall quite well with treatment without any overt prohibitive toxicities. 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  Required treatment with high-dose  steroids and dietary modifications.  Symptoms progressive to have resolved.  Currently on PPI for overt symptoms. CT chest abdomen pelvis in March 2017 showed no evidence of disease progression. Patient was seen at [facility] by Dr. Dennison Nancy and was given the option of observation versus adjuvant gemcitabine. She  had a followup at [facility] with a CT Chest abdomen pelvis on 10/16/2015 which showed no evidence of recurrent or metastatic disease. Patient has completed 4 months of Adjuvant gemcitabine as per recommendation from [place]. Rpt CT C/A/P  On 01/17/2016 -- shows no evidence of disease recurrence/progression. Rpt CT C/A/P  On 08/06/2016 -- shows no evidence of disease recurrence/progression. Rpt CT C/A/P on 01/20/17 shows no evidence of local recurrence or metastatic disease  Rpt CT C/A/P on 07/29/17 shows heterogeneously enhancing mass in the inferior right liver, highly concerning for metastatic Disease. Otherwise scan was stable.   Plan  -I discussed with the pt her CT CAP from 07/29/17 which showed heterogeneously enhancing mass in the inferior right liver. Otherwise scan was stable.  -Physical exam was unremarkable.  -I discussed her family support and taking hydroxyzine for her anxiety as needed.  -Discussed pt labwork today, 08/14/17; blood counts and chemistries are stable.  -Discussed 08/11/17 surgical pathology revealing a return of her intrahepatic cholangiocarecinoma.  -Discussed the options of surgery, which would require an evaluation of her candidacy at [facility], followed by chemotherapy.  -Radiation, percutaneous needle ablation, and possible transarterial bland/chemorx embolization delivered therapy, were also discussed as possible treatment options that will need further evaluation.  -Discussed that a surgery evaluation would need to be at [facility] since she has already had surgery there.  -Will refer pt to [facility]  -Will refer pt to IR evaluation   2) Smoking and marijuana use -I again counseled on smoking and marijuana cessation, she is working on quitting.   3) Mild thrombocytopenia-resolved.     -Interventional radiology referral for consideration of percutaneous needle ablation for recurrent intrahepatic cholangiocarcinoma -f/u with liver surgeon at [facility] with Dr Irene Limbo in 3 weeks with  labs  The toal time spent in the appt was 25 minutes and more than 50% was on counseling and direct patient cares.  Sullivan Lone MD [place] AAHIVMS [facility] Hematology/Oncology Physician [place]  (Office):       [phone] (Work cell):  [phone] (Fax):           [phone]  This document serves as a record of services personally performed by Sullivan Lone, MD. It was created on his behalf by Baldwin Jamaica, a trained medical scribe. The creation of this record is based on the scribe's personal observations and the provider's statements to them.   .I have reviewed the above documentation for accuracy and completeness, and I agree with the above. Brunetta Genera MD MS

## 2017-08-14 ENCOUNTER — Inpatient Hospital Stay: Payer: Medicare HMO | Attending: Hematology | Admitting: Hematology

## 2017-08-14 ENCOUNTER — Encounter: Payer: Self-pay | Admitting: Hematology

## 2017-08-14 VITALS — BP 161/58 | HR 62 | Temp 97.9°F | Resp 17 | Ht 63.0 in | Wt 193.1 lb

## 2017-08-14 DIAGNOSIS — Z7984 Long term (current) use of oral hypoglycemic drugs: Secondary | ICD-10-CM | POA: Insufficient documentation

## 2017-08-14 DIAGNOSIS — J439 Emphysema, unspecified: Secondary | ICD-10-CM | POA: Diagnosis not present

## 2017-08-14 DIAGNOSIS — K589 Irritable bowel syndrome without diarrhea: Secondary | ICD-10-CM | POA: Insufficient documentation

## 2017-08-14 DIAGNOSIS — I2510 Atherosclerotic heart disease of native coronary artery without angina pectoris: Secondary | ICD-10-CM | POA: Diagnosis not present

## 2017-08-14 DIAGNOSIS — F1210 Cannabis abuse, uncomplicated: Secondary | ICD-10-CM | POA: Diagnosis not present

## 2017-08-14 DIAGNOSIS — E785 Hyperlipidemia, unspecified: Secondary | ICD-10-CM | POA: Insufficient documentation

## 2017-08-14 DIAGNOSIS — I700 Atherosclerosis of aorta: Secondary | ICD-10-CM | POA: Insufficient documentation

## 2017-08-14 DIAGNOSIS — G4733 Obstructive sleep apnea (adult) (pediatric): Secondary | ICD-10-CM | POA: Diagnosis not present

## 2017-08-14 DIAGNOSIS — R918 Other nonspecific abnormal finding of lung field: Secondary | ICD-10-CM | POA: Insufficient documentation

## 2017-08-14 DIAGNOSIS — M1990 Unspecified osteoarthritis, unspecified site: Secondary | ICD-10-CM | POA: Diagnosis not present

## 2017-08-14 DIAGNOSIS — J449 Chronic obstructive pulmonary disease, unspecified: Secondary | ICD-10-CM | POA: Diagnosis not present

## 2017-08-14 DIAGNOSIS — Z923 Personal history of irradiation: Secondary | ICD-10-CM | POA: Diagnosis not present

## 2017-08-14 DIAGNOSIS — E119 Type 2 diabetes mellitus without complications: Secondary | ICD-10-CM | POA: Diagnosis not present

## 2017-08-14 DIAGNOSIS — F17210 Nicotine dependence, cigarettes, uncomplicated: Secondary | ICD-10-CM | POA: Insufficient documentation

## 2017-08-14 DIAGNOSIS — Z7982 Long term (current) use of aspirin: Secondary | ICD-10-CM | POA: Diagnosis not present

## 2017-08-14 DIAGNOSIS — K219 Gastro-esophageal reflux disease without esophagitis: Secondary | ICD-10-CM | POA: Insufficient documentation

## 2017-08-14 DIAGNOSIS — I10 Essential (primary) hypertension: Secondary | ICD-10-CM | POA: Diagnosis not present

## 2017-08-14 DIAGNOSIS — K7581 Nonalcoholic steatohepatitis (NASH): Secondary | ICD-10-CM | POA: Diagnosis not present

## 2017-08-14 DIAGNOSIS — C221 Intrahepatic bile duct carcinoma: Secondary | ICD-10-CM | POA: Insufficient documentation

## 2017-08-14 DIAGNOSIS — C799 Secondary malignant neoplasm of unspecified site: Secondary | ICD-10-CM | POA: Diagnosis not present

## 2017-08-14 DIAGNOSIS — F418 Other specified anxiety disorders: Secondary | ICD-10-CM

## 2017-08-14 DIAGNOSIS — Z79899 Other long term (current) drug therapy: Secondary | ICD-10-CM | POA: Diagnosis not present

## 2017-08-14 DIAGNOSIS — Z9221 Personal history of antineoplastic chemotherapy: Secondary | ICD-10-CM | POA: Diagnosis not present

## 2017-08-14 NOTE — Patient Instructions (Signed)
Thank you for choosing [facility] to provide your oncology and hematology care.  To afford each patient quality time with our providers, please arrive 30 minutes before your scheduled appointment time.  If you arrive late for your appointment, you may be asked to reschedule.  We strive to give you quality time with our providers, and arriving late affects you and other patients whose appointments are after yours.   If you are a no show for multiple scheduled visits, you may be dismissed from the clinic at the providers discretion.    Again, thank you for choosing [facility], our hope is that these requests will decrease the amount of time that you wait before being seen by our physicians.  ______________________________________________________________________  Should you have questions after your visit to the [facility], please contact our office at [phone] between the hours of 8:30 and 4:30 p.m.    Voicemails left after 4:30p.m will not be returned until the following business day.    For prescription refill requests, please have your pharmacy contact us directly.  Please also try to allow 48 hours for prescription requests.    Please contact the scheduling department for questions regarding scheduling.  For scheduling of procedures such as PET scans, CT scans, MRI, Ultrasound, etc please contact central scheduling at [phone].    Resources For Cancer Patients and Caregivers:   Oncolink.org:  A wonderful resource for patients and healthcare providers for information regarding your disease, ways to tract your treatment, what to expect, etc.     American Cancer Society:  800-227-2345  Can help patients locate various types of support and financial assistance  Cancer Care: 1-800-813-HOPE (4673) Provides financial assistance, online support groups, medication/co-pay assistance.    [place] DSS:  336-641-3447 Where to apply for food  stamps, Medicaid, and utility assistance  Medicare Rights Center: 800-333-4114 Helps people with Medicare understand their rights and benefits, navigate the Medicare system, and secure the quality healthcare they deserve  SCAT: 336-333-6589 [place] [employer]'s shared-ride transportation service for eligible riders who have a disability that prevents them from riding the fixed route bus.    For additional information on assistance programs please contact our social worker:   Grier Hock/Abigail Elmore:  [phone]            

## 2017-08-15 ENCOUNTER — Telehealth: Payer: Self-pay | Admitting: *Deleted

## 2017-08-15 ENCOUNTER — Ambulatory Visit: Payer: Medicare HMO | Admitting: Hematology

## 2017-08-15 NOTE — Telephone Encounter (Signed)
Call from pt reporting she made an appointment to see Dr. Raylene Everts at [facility] on 5/20. She wanted to make MD aware. Also requests office note to be sent for Dr. Raylene Everts to review.  Message to MD/ pod.

## 2017-08-20 ENCOUNTER — Telehealth: Payer: Self-pay

## 2017-08-20 NOTE — Telephone Encounter (Signed)
Request from patient to send most recent OV note to Dr. Raylene Everts, Radiation Oncologist at [facility]. Called office to obtain fax number and OV note faxed to [phone]. Confirmed fax receipt 08/19/17 at 1610.

## 2017-08-28 ENCOUNTER — Encounter: Payer: Self-pay | Admitting: *Deleted

## 2017-08-28 NOTE — Progress Notes (Signed)
On 08-27-17 fax medical records to [facility] ,it was consult note, end of tx note, and sim & planning note and dosimetry will do there part

## 2017-09-04 ENCOUNTER — Inpatient Hospital Stay: Payer: Medicare HMO

## 2017-09-04 ENCOUNTER — Inpatient Hospital Stay: Payer: Medicare HMO | Admitting: Hematology

## 2017-09-05 ENCOUNTER — Telehealth: Payer: Self-pay

## 2017-09-05 NOTE — Telephone Encounter (Signed)
Called pt regarding no show yesterday. Pt currently being seen by [facility]. Pt will call back when f/u needed.

## 2017-10-04 ENCOUNTER — Emergency Department (HOSPITAL_COMMUNITY)
Admission: EM | Admit: 2017-10-04 | Discharge: 2017-10-04 | Disposition: A | Discharge status: Home / Self Care | Payer: Medicare HMO | Attending: Emergency Medicine | Admitting: Emergency Medicine

## 2017-10-04 ENCOUNTER — Encounter (HOSPITAL_COMMUNITY): Payer: Self-pay

## 2017-10-04 ENCOUNTER — Other Ambulatory Visit: Payer: Self-pay

## 2017-10-04 DIAGNOSIS — E119 Type 2 diabetes mellitus without complications: Secondary | ICD-10-CM | POA: Diagnosis not present

## 2017-10-04 DIAGNOSIS — Z7984 Long term (current) use of oral hypoglycemic drugs: Secondary | ICD-10-CM | POA: Diagnosis not present

## 2017-10-04 DIAGNOSIS — I10 Essential (primary) hypertension: Secondary | ICD-10-CM | POA: Diagnosis not present

## 2017-10-04 DIAGNOSIS — Z7982 Long term (current) use of aspirin: Secondary | ICD-10-CM | POA: Diagnosis not present

## 2017-10-04 DIAGNOSIS — R112 Nausea with vomiting, unspecified: Secondary | ICD-10-CM | POA: Diagnosis present

## 2017-10-04 DIAGNOSIS — Z79899 Other long term (current) drug therapy: Secondary | ICD-10-CM | POA: Diagnosis not present

## 2017-10-04 DIAGNOSIS — J449 Chronic obstructive pulmonary disease, unspecified: Secondary | ICD-10-CM | POA: Diagnosis not present

## 2017-10-04 DIAGNOSIS — M6281 Muscle weakness (generalized): Secondary | ICD-10-CM | POA: Diagnosis not present

## 2017-10-04 DIAGNOSIS — F17210 Nicotine dependence, cigarettes, uncomplicated: Secondary | ICD-10-CM | POA: Diagnosis not present

## 2017-10-04 MED ORDER — ONDANSETRON 8 MG PO TBDP
8.0000 mg | ORAL_TABLET | Freq: Once | ORAL | Status: AC
  Administered 2017-10-04: 8 mg via ORAL
  Filled 2017-10-04: qty 1

## 2017-10-04 MED ORDER — OMEPRAZOLE 20 MG PO CPDR: DELAYED_RELEASE_CAPSULE | ORAL | 0 refills | Status: DC

## 2017-10-04 MED ORDER — GI COCKTAIL ~~LOC~~
30.0000 mL | Freq: Once | ORAL | Status: AC
  Administered 2017-10-04: 30 mL via ORAL
  Filled 2017-10-04: qty 30

## 2017-10-04 MED ORDER — FAMOTIDINE 20 MG PO TABS
20.0000 mg | ORAL_TABLET | Freq: Once | ORAL | Status: AC
  Administered 2017-10-04: 20 mg via ORAL
  Filled 2017-10-04: qty 1

## 2017-10-04 NOTE — ED Triage Notes (Signed)
Brought in by [facility] for nausea. Has liver cancer and had radiation today-only 1 episode of vomiting.

## 2017-10-04 NOTE — ED Provider Notes (Signed)
[facility] DEPT Provider Note   CSN: [id number] Arrival date & time: 10/04/17  0236  Time seen 04:25 AM   History   Chief Complaint Chief Complaint  Patient presents with  . Nausea    HPI Bonnie Peterson is a 72 y.o. female.  HPI patient states she started getting radiation treatment in [place] on June 19.  She gets it throughout the week.  During the week she stays with family in [place].  She came home this evening.  She had her usual radiation treatment this morning.  She states however afterwards she does have a burning sensation all over her body.  She states she feels weak and she states she suddenly developed nausea about 12:30 AM this morning.  She vomited once.  She thinks she may have indigestion or reflux although she is never had it before.  She denies a burning or acid taste to her vomitus. She states her nausea is better now.  PCP Bufford Lope, DO   Past Medical History:  Diagnosis Date  . Anxiety   . Benign positional vertigo   . 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 COPD (chronic obstructive pulmonary disease) ([place])   . Depression   . Diabetes mellitus without complication ([place])   . Early cataracts, bilateral 10/13   Optho, Dr Gershon Crane  . Echocardiogram findings abnormal, without diagnosis 10/10   10/10: mild pulm HTN, EF 60-65%, mild LVH, moderate aortic regurg  . GERD (gastroesophageal reflux disease)    I have "acid reflux"  . Gout   . HLD (hyperlipidemia)   . HTN, goal below 130/80   . Irritable bowel syndrome   . Obesity, Class III, BMI 40-49.9 (morbid obesity) ([place])   . Obstructive sleep apnea    wears cpap  . Osteoarthritis (arthritis due to wear and tear of joints)    also gout  . Restrictive lung disease     Patient Active Problem List   Diagnosis Date Noted  . Type 2 diabetes mellitus without complication, without long-term current use of insulin ([place]) 01/05/2017  . Healthcare maintenance  01/24/2016  . Back pain 11/23/2015  . Port catheter in place 11/10/2015  . 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 History of resection of liver 02/10/2015  . 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 Inadequate sleep hygiene 08/22/2014  . 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 IBS (irritable bowel syndrome) 03/22/2013  . Liver mass, left lobe 02/24/2013  . Osteoporosis 10/15/2012  . Vitamin D deficiency 12/23/2011  . Cigarette smoker 10/12/2010  . Chronic obstructive pulmonary disease ([place]) 06/14/2010  . Obstructive sleep apnea 04/27/2009  . OBESITY, UNSPECIFIED 04/26/2009  . Hyperlipidemia 11/25/2007  . GOUT NOS 12/24/2006  . Recurrent major depressive disorder ([place]) 06/05/2006  . ANXIETY 06/05/2006  . Tobacco use 06/05/2006  . HYPERTENSION, BENIGN SYSTEMIC 06/05/2006  . Osteoarthritis 06/05/2006  . Generalized anxiety disorder 06/05/2006    Past Surgical History:  Procedure Laterality Date  . ABDOMINAL HYSTERECTOMY    . ESOPHAGOGASTRODUODENOSCOPY N/A 06/15/2015   Procedure: ESOPHAGOGASTRODUODENOSCOPY (EGD);  Surgeon: Gatha Mayer, MD;  Location: [facility];  Service: Endoscopy;  Laterality: N/A;  . IR REMOVAL TUN ACCESS W/ PORT W/O FL MOD SED  11/11/2016  . LIVER BIOPSY    . OPEN PARTIAL HEPATECTOMY  Left 02/09/15  . PARTIAL HYSTERECTOMY    . ROTATOR CUFF REPAIR       L rotator cuff  repair 11/07-[facility] - 12/7/200  . TONSILLECTOMY       OB History   None      Home Medications    Prior to Admission medications   Medication Sig Start Date End Date Taking? Authorizing Provider  alendronate (FOSAMAX) 70 MG tablet TAKE 1 TABLET  ONCE A WEEK. 01/03/17   Orson Eva J, DO  allopurinol (ZYLOPRIM) 100 MG tablet TAKE 1 TABLET EVERY DAY 08/04/17   Bufford Lope, DO  amLODipine (NORVASC) 10 MG tablet Take 1 tablet (10 mg total) by mouth daily. 01/03/17   Bufford Lope, DO  aspirin EC 81 MG tablet Take 1  tablet (81 mg total) by mouth daily. 01/03/17 01/03/18  Bufford Lope, DO  atenolol (TENORMIN) 25 MG tablet Take 1 tablet (25 mg total) by mouth daily. 01/03/17   Bufford Lope, DO  Blood Glucose Monitoring Suppl (ONETOUCH VERIO) w/Device KIT 1 kit by Does not apply route as needed. 07/18/17   Bufford Lope, DO  cholecalciferol (VITAMIN D) 1000 units tablet Take 1 tablet (1,000 Units total) by mouth daily. 01/03/17   Bufford Lope, DO  diclofenac sodium (VOLTAREN) 1 % GEL Apply 2 g topically 4 (four) times daily as needed. 08/05/16   Haney, Amedeo Plenty, MD  fish oil-omega-3 fatty acids 1000 MG capsule Take 1 g by mouth every 30 (thirty) days.     [provider]  fluticasone (FLONASE) 50 MCG/ACT nasal spray Place 2 sprays into both nostrils daily as needed for rhinitis. 04/17/16   Veatrice Bourbon, MD  hydrOXYzine (ATARAX/VISTARIL) 25 MG tablet Take 1 tablet (25 mg total) by mouth at bedtime. 07/31/17   Lovenia Kim, MD  Hyprom-Naphaz-Polysorb-Zn Sulf (CLEAR EYES COMPLETE OP) Place 1 drop into both eyes daily as needed (dry eyes).     [provider]  lisinopril-hydrochlorothiazide (PRINZIDE,ZESTORETIC) 20-25 MG tablet Take 1 tablet by mouth daily. 01/03/17   Bufford Lope, DO  loratadine (CLARITIN) 10 MG tablet Take 1 tablet (10 mg total) by mouth daily as needed for allergies. 09/23/12   Waldemar Dickens, MD  metFORMIN (GLUCOPHAGE) 500 MG tablet Take 1 tablet (500 mg total) by mouth daily with breakfast. 07/10/17   Bufford Lope, DO  Multiple Vitamin (MULTIVITAMIN WITH MINERALS) TABS tablet Take 1 tablet by mouth daily.    [provider]  omeprazole (PRILOSEC) 20 MG capsule Take 1 po BID x 2 weeks then once a day 10/04/17   Rolland Porter, MD  PROAIR HFA 108 (90 Base) MCG/ACT inhaler INHALE 2 PUFFS INTO THE LUNGS EVERY 4 HOURS AS NEEDED FOR WHEEZING OR SHORTNESS OF BREATH 07/18/15   Melancon, York Ram, MD  VITAMIN E PO Take 1 tablet by mouth daily.     [provider]    Family  History Family History  Problem Relation Age of Onset  . Breast cancer Mother   . Hypertension Mother   . Coronary artery disease Mother   . Diabetes type II Mother   . Rheum arthritis Mother   . Diabetes type II Sister   . Pancreatic cancer Sister     Social History Social History   Tobacco Use  . Smoking status: Current Some Day Smoker    Packs/day: 0.50    Years: 49.00    Pack years: 24.50    Types: Cigarettes  . Smokeless tobacco: Never Used  Substance Use Topics  . Alcohol use: Yes    Alcohol/week: 0.0 oz    Comment:  occasionally  . Drug use: Yes    Types: Marijuana  lives at home Lives alone   Allergies   Other   Review of Systems Review of Systems  All other systems reviewed and are negative.    Physical Exam Updated Vital Signs BP (!) 129/55 (BP Location: Left Arm)   Pulse (!) 57   Temp (!) 97.5 F (36.4 C) (Oral)   Resp 18   Ht 5' 3"  (1.6 m)   Wt 85.3 kg (188 lb)   SpO2 96%   BMI 33.30 kg/m   Physical Exam  Constitutional: She is oriented to person, place, and time. She appears well-developed and well-nourished.  Non-toxic appearance. She does not appear ill. No distress.  HENT:  Head: Normocephalic and atraumatic.  Right Ear: External ear normal.  Left Ear: External ear normal.  Nose: Nose normal. No mucosal edema or rhinorrhea.  Mouth/Throat: Oropharynx is clear and moist and mucous membranes are normal. No dental abscesses or uvula swelling.  Eyes: Pupils are equal, round, and reactive to light. Conjunctivae and EOM are normal.  Neck: Normal range of motion and full passive range of motion without pain. Neck supple.  Cardiovascular: Normal rate, regular rhythm and normal heart sounds. Exam reveals no gallop and no friction rub.  No murmur heard. Pulmonary/Chest: Effort normal and breath sounds normal. No respiratory distress. She has no wheezes. She has no rhonchi. She has no rales. She exhibits no tenderness and no crepitus.  Abdominal:  Soft. Normal appearance and bowel sounds are normal. She exhibits no distension. There is tenderness in the right upper quadrant. There is no rebound and no guarding.  Patient indicates she has some discomfort in her right upper abdomen and that is where it appears her radiation is centered.  She still has the marks on her abdomen that they are using.  There is no however superficial redness or burns of the skin seen.  Musculoskeletal: Normal range of motion. She exhibits no edema or tenderness.  Moves all extremities well.   Neurological: She is alert and oriented to person, place, and time. She has normal strength. No cranial nerve deficit.  Skin: Skin is warm, dry and intact. No rash noted. No erythema. No pallor.  Psychiatric: She has a normal mood and affect. Her speech is normal and behavior is normal. Her mood appears not anxious.  Nursing note and vitals reviewed.    ED Treatments / Results  Labs (all labs ordered are listed, but only abnormal results are displayed) Labs Reviewed - No data to display  EKG None  Radiology No results found.  Procedures Procedures (including critical care time)  Medications Ordered in ED Medications  gi cocktail (Maalox,Lidocaine,Donnatal) (30 mLs Oral Given 10/04/17 0502)  famotidine (PEPCID) tablet 20 mg (20 mg Oral Given 10/04/17 0502)  ondansetron (ZOFRAN-ODT) disintegrating tablet 8 mg (8 mg Oral Given 10/04/17 0502)     Initial Impression / Assessment and Plan / ED Course  I have reviewed the triage vital signs and the nursing notes.  Pertinent labs & imaging results that were available during my care of the patient were reviewed by me and considered in my medical decision making (see chart for details).     Patient was given GI cocktail, Pepcid and Zofran ODT.  Recheck at 6:30 AM patient is resting comfortably, she states she feels better.  Family wants to leave without signing out.  Final Clinical Impressions(s) / ED Diagnoses    Final diagnoses:  Non-intractable vomiting with  nausea, unspecified vomiting type    ED Discharge Orders        Ordered    omeprazole (PRILOSEC) 20 MG capsule     10/04/17 0636      Plan discharge  Rolland Porter, MD, Barbette Or, MD 10/04/17 [phone]

## 2017-10-04 NOTE — Discharge Instructions (Addendum)
Use your nausea medication as needed. Start the omeprazole for stomach acid. Keep your appointments at [facility].

## 2017-10-04 NOTE — ED Notes (Signed)
Patient left before getting discharge papers and vital signs. Patient family rolled patient out in a wheelchair. Family was upset due to the doctor.

## 2017-10-04 NOTE — ED Notes (Signed)
Bed: OE32 Expected date:  Expected time:  Means of arrival:  Comments: EMS 72 yo female from home/nausea and weakness-liver cancer-radiation yesterday afternoon-vomited x 1

## 2017-10-21 ENCOUNTER — Other Ambulatory Visit: Payer: Self-pay | Admitting: Family Medicine

## 2017-10-21 DIAGNOSIS — M810 Age-related osteoporosis without current pathological fracture: Secondary | ICD-10-CM

## 2017-10-21 DIAGNOSIS — M1A9XX0 Chronic gout, unspecified, without tophus (tophi): Secondary | ICD-10-CM

## 2017-10-21 DIAGNOSIS — I10 Essential (primary) hypertension: Secondary | ICD-10-CM

## 2017-10-22 ENCOUNTER — Other Ambulatory Visit: Payer: Self-pay | Admitting: *Deleted

## 2017-10-22 DIAGNOSIS — E119 Type 2 diabetes mellitus without complications: Secondary | ICD-10-CM

## 2017-10-22 MED ORDER — METFORMIN HCL 500 MG PO TABS: 500.0000 mg | ORAL_TABLET | Freq: Every day | ORAL | 1 refills | Status: DC

## 2017-10-22 NOTE — Telephone Encounter (Signed)
Rx refilled, can patient please be informed that she is due for diabetes follow up

## 2017-10-23 NOTE — Telephone Encounter (Signed)
Patient scheduled appt for 11-13-17. Jazmin Hartsell,CMA

## 2017-11-13 ENCOUNTER — Ambulatory Visit (INDEPENDENT_AMBULATORY_CARE_PROVIDER_SITE_OTHER): Payer: Medicare HMO | Admitting: Family Medicine

## 2017-11-13 ENCOUNTER — Encounter: Payer: Self-pay | Admitting: Family Medicine

## 2017-11-13 ENCOUNTER — Other Ambulatory Visit: Payer: Self-pay

## 2017-11-13 VITALS — BP 118/58 | HR 65 | Temp 98.0°F | Ht 63.0 in | Wt 185.6 lb

## 2017-11-13 DIAGNOSIS — E119 Type 2 diabetes mellitus without complications: Secondary | ICD-10-CM | POA: Diagnosis not present

## 2017-11-13 DIAGNOSIS — Z1239 Encounter for other screening for malignant neoplasm of breast: Secondary | ICD-10-CM

## 2017-11-13 DIAGNOSIS — E785 Hyperlipidemia, unspecified: Secondary | ICD-10-CM | POA: Diagnosis not present

## 2017-11-13 DIAGNOSIS — Z1231 Encounter for screening mammogram for malignant neoplasm of breast: Secondary | ICD-10-CM

## 2017-11-13 LAB — POCT GLYCOSYLATED HEMOGLOBIN (HGB A1C): HbA1c, POC (controlled diabetic range): 6.6 % (ref 0.0–7.0)

## 2017-11-13 NOTE — Progress Notes (Signed)
    Subjective:  Bonnie Peterson is a 72 y.o. female who presents to the [facility] today for diabetes follow up  HPI:  Diabetes Was started on metformin 500mg  daily at last visit for diabetes on 07/10/17. Has been compliant and tolerating well. Wants to know if she need to check sugars at home Her appetite has continued to be diminished since her oncology treatments for intrahepatic cholangiocarcinoma No CP, SOB, LE edema, polyuria or polydipsia  Hyperlipidemia Her insurance called her about this recently which was not helpful as she already wanted to discuss staying off medication. Thinks she can manage this with her diet.  Understands the risk of heart attack and stroke with elevated ASCVD but given her  intrahepatic cholangiocarcinoma would be okay with not surviving an event.   ROS: Per HPI  Objective:  Physical Exam: BP (!) 118/58   Pulse 65   Temp 98 F (36.7 C) (Oral)   Ht 5\' 3"  (1.6 m)   Wt 185 lb 9.6 oz (84.2 kg)   SpO2 97%   BMI 32.88 kg/m   Gen: NAD, resting comfortably CV: RRR with no murmurs appreciated Pulm: NWOB, CTAB with no crackles, wheezes, or rhonchi GI: Normal bowel sounds present. Soft, Nontender, Nondistended. MSK: no edema, cyanosis, or clubbing noted Skin: warm, dry Neuro: grossly normal, moves all extremities Psych: Normal affect and thought content  Results for orders placed or performed in visit on 11/13/17 (from the past 72 hour(s))  POCT glycosylated hemoglobin (Hb A1C)     Status: None   Collection Time: 11/13/17  9:30 AM  Result Value Ref Range   Hemoglobin A1C     HbA1c POC (<> result, manual entry)     HbA1c, POC (prediabetic range)     HbA1c, POC (controlled diabetic range) 6.6 0.0 - 7.0 %     Assessment/Plan:  Type 2 diabetes mellitus without complication, without long-term current use of insulin (HCC) Controlled and doing well on metformin 500mg  qd. Continue current management. UTD with last DM eye exam and is due for another this sept  which patient will self schedule.   Hyperlipidemia Patient counseled on risk vs benefits of statin. Given her current issues with intrahepatic cholangiocarcinoma, patient prefers to stay off and agreed to diet modification and recheck of lipid panel this winter.    Bufford Lope, DO PGY-3, [facility] 11/13/2017 9:41 AM

## 2017-11-13 NOTE — Assessment & Plan Note (Signed)
Patient counseled on risk vs benefits of statin. Given her current issues with intrahepatic cholangiocarcinoma, patient prefers to stay off and agreed to diet modification and recheck of lipid panel this winter.

## 2017-11-13 NOTE — Patient Instructions (Addendum)
Congratulations on your good diabetes control.  Come back for a lab visit to check your cholesterol in 2-3 months, you will need to be fasting at least 8 hours for this so  I recommend a morning lab appointment.  I will see you in 3 months.

## 2017-11-13 NOTE — Assessment & Plan Note (Signed)
Controlled and doing well on metformin 500mg  qd. Continue current management. UTD with last DM eye exam and is due for another this sept which patient will self schedule.

## 2017-11-28 ENCOUNTER — Other Ambulatory Visit: Payer: Self-pay | Admitting: Family Medicine

## 2017-11-28 DIAGNOSIS — Z1231 Encounter for screening mammogram for malignant neoplasm of breast: Secondary | ICD-10-CM

## 2018-01-05 ENCOUNTER — Telehealth: Payer: Self-pay | Admitting: *Deleted

## 2018-01-05 NOTE — Telephone Encounter (Signed)
I spoke with Bonnie Peterson.  She does have an appointment now.  She wanted me to know that she was hung up on.  (We do not have an employee named Becky.)  We both agreed that we should not hang up on patients.  In speaking with Bonnie Peterson, I now understand that dropped calls are a problem with the new phone system.

## 2018-01-05 NOTE — Telephone Encounter (Addendum)
Pt calls nurse line.  She demands for Dr. Andria Frames to call her back.  She then starts to tell me that "Becky hung up on me and Im gonna report it to Dr. Andria Frames".  Apologized that this happened to her and attempted to get more information so I asked again the persons name.  She then started to yell " I SAID IT WAS BECKY".  When I told her that we do not have a Becky she said "Laurie DR. HENSEL CALL ME!"    Advised I would send a message to Dr. Andria Frames. Jadier Rockers, Salome Spotted, CMA

## 2018-01-14 ENCOUNTER — Other Ambulatory Visit: Payer: Self-pay

## 2018-01-14 DIAGNOSIS — E119 Type 2 diabetes mellitus without complications: Secondary | ICD-10-CM

## 2018-01-14 DIAGNOSIS — I10 Essential (primary) hypertension: Secondary | ICD-10-CM

## 2018-01-14 DIAGNOSIS — M810 Age-related osteoporosis without current pathological fracture: Secondary | ICD-10-CM

## 2018-01-14 DIAGNOSIS — M1A9XX0 Chronic gout, unspecified, without tophus (tophi): Secondary | ICD-10-CM

## 2018-01-15 MED ORDER — ATENOLOL 25 MG PO TABS: 25.0000 mg | ORAL_TABLET | Freq: Every day | ORAL | 3 refills | Status: DC

## 2018-01-15 MED ORDER — LISINOPRIL-HYDROCHLOROTHIAZIDE 20-25 MG PO TABS: 1.0000 | ORAL_TABLET | Freq: Every day | ORAL | 3 refills | Status: DC

## 2018-01-15 MED ORDER — METFORMIN HCL 500 MG PO TABS: 500.0000 mg | ORAL_TABLET | Freq: Every day | ORAL | 1 refills | Status: DC

## 2018-01-15 MED ORDER — AMLODIPINE BESYLATE 10 MG PO TABS: 10.0000 mg | ORAL_TABLET | Freq: Every day | ORAL | 1 refills | Status: DC

## 2018-01-15 MED ORDER — ALENDRONATE SODIUM 70 MG PO TABS: ORAL_TABLET | ORAL | 2 refills | Status: DC

## 2018-01-15 MED ORDER — ALLOPURINOL 100 MG PO TABS: 100.0000 mg | ORAL_TABLET | Freq: Every day | ORAL | 1 refills | Status: DC

## 2018-01-19 ENCOUNTER — Telehealth: Payer: Self-pay

## 2018-01-19 NOTE — Telephone Encounter (Signed)
Received fax from [place] needed on One Touch Verio.    Please resend as Accu-Chek Aviva per our diabetic supply sheet.   Cover My Meds info: Key: [id number]   Danley Danker, RN [facility] RN)

## 2018-01-20 NOTE — Telephone Encounter (Signed)
Patient is only on metformin at this point with an improved a1c. Called patient and discussed whether she still wants a glucometer because at this point I do not see a medical need for it. She voiced that if it is not currently medically necessary that she does not want a glucometer. I will not send in a new glucometer at this point.

## 2018-01-20 NOTE — Telephone Encounter (Signed)
Noted and request archived in Cover My Meds. Zacchary Pompei, Salome Spotted, CMA

## 2018-01-26 ENCOUNTER — Other Ambulatory Visit: Payer: Self-pay

## 2018-01-26 ENCOUNTER — Ambulatory Visit (INDEPENDENT_AMBULATORY_CARE_PROVIDER_SITE_OTHER): Payer: Medicare HMO | Admitting: Family Medicine

## 2018-01-26 ENCOUNTER — Encounter: Payer: Self-pay | Admitting: Family Medicine

## 2018-01-26 VITALS — BP 134/62 | HR 79 | Temp 98.3°F | Ht 63.0 in | Wt 189.0 lb

## 2018-01-26 DIAGNOSIS — M1612 Unilateral primary osteoarthritis, left hip: Secondary | ICD-10-CM

## 2018-01-26 DIAGNOSIS — I10 Essential (primary) hypertension: Secondary | ICD-10-CM | POA: Diagnosis not present

## 2018-01-26 DIAGNOSIS — Z23 Encounter for immunization: Secondary | ICD-10-CM

## 2018-01-26 DIAGNOSIS — E119 Type 2 diabetes mellitus without complications: Secondary | ICD-10-CM | POA: Diagnosis not present

## 2018-01-26 MED ORDER — DICLOFENAC SODIUM 1 % TD GEL: 2.0000 g | Freq: Four times a day (QID) | TRANSDERMAL | 4 refills | Status: DC | PRN

## 2018-01-26 NOTE — Assessment & Plan Note (Signed)
Doing well on metformin 500 mg daily.  Continue current management.  Patient stated that [facility] will fax over her latest results from her visit in September.

## 2018-01-26 NOTE — Patient Instructions (Addendum)
Good to see you again.  I have sent a refill of your voltaren gel and a new cane. Please let me know which kind of commode lift you need.  Your blood pressure looks good today, no changes to any medications.  We will hold off on checking your cholesterol at this time since we will not start a cholesterol medication with everything you are going through.  Please exercise regularly and eat healthy.  Feel free to call with any questions or concerns at any time, at [phone].   Take care,  Dr. Bufford Lope, [place]

## 2018-01-26 NOTE — Progress Notes (Signed)
    Subjective:  Bonnie Peterson is a 72 y.o. female who presents to the [facility] today for HTN for diabetes.   HPI:  Hypertension Medication: Atenolol 25 mg daily, Norvasc 10 mg daily, lisinopril 20 mg daily, hydrochlorthiazide 25 mg daily Compliance- good, she is tolerating her medications well.  She wonders if she can cut down on some of them.  Chest pain- no      Dyspnea- no  Lightheadedness- no   Edema- no      Diabetes  Medications: Metformin 500 mg daily Compliance-good, tolerating well hypoglycemic symptoms- no  Polyuria- no New Visual problems- no Is up-to-date on her yearly diabetic eye exam, she saw [facility] in September.    Osteoarthritis She states that has been under good control with topical Voltaren.  She needs a cane to get around but has lost hers and would like a replacement.  She also would like commode left to make it easier for her to sit when using the bathroom however she is unsure of what equipment she needs.  She has people to come to her house to help her with these antimanic care.  She will let us know regarding her home health needs the next time they visit her.  ROS: Per HPI  Social Hx: She reports that she has been smoking cigarettes. She has a 24.50 pack-year smoking history. She has never used smokeless tobacco. She reports that she drinks alcohol. She reports that she has current or past drug history. Drug: Marijuana.   Objective:  Physical Exam: BP 134/62   Pulse 79   Temp 98.3 F (36.8 C) (Oral)   Ht 5' 3"  (1.6 m)   Wt 189 lb (85.7 kg)   SpO2 96%   BMI 33.48 kg/m   Gen: NAD, resting comfortably CV: RRR with no murmurs appreciated Pulm: NWOB, CTAB with no crackles, wheezes, or rhonchi GI: Normal bowel sounds present. Soft, Nontender, Nondistended. MSK: no edema, cyanosis, or clubbing noted Skin: warm, dry Neuro: grossly normal, moves all extremities Psych: Normal affect and thought content   Assessment/Plan:  HYPERTENSION, BENIGN  SYSTEMIC Stable and controlled.  Discussed with patient that her blood pressure is at goal today and that since she is tolerating all of her medications well without any side effects or symptomatic hypotension that would recommend continuing current management.  Patient is agreeable.  Continue Atenolol 25 mg daily, Norvasc 10 mg daily, lisinopril 20 mg daily, hydrochlorthiazide 25 mg daily  Type 2 diabetes mellitus without complication, without long-term current use of insulin (HCC) Doing well on metformin 500 mg daily.  Continue current management.  Patient stated that [facility] will fax over her latest results from her visit in September.  Osteoarthritis Stable and doing well with topical Voltaren.  DME cane ordered for her today.   Bufford Lope, DO PGY-3, [facility] 01/26/2018 1:54 PM

## 2018-01-26 NOTE — Assessment & Plan Note (Signed)
Stable and controlled.  Discussed with patient that her blood pressure is at goal today and that since she is tolerating all of her medications well without any side effects or symptomatic hypotension that would recommend continuing current management.  Patient is agreeable.  Continue Atenolol 25 mg daily, Norvasc 10 mg daily, lisinopril 20 mg daily, hydrochlorthiazide 25 mg daily

## 2018-01-26 NOTE — Assessment & Plan Note (Signed)
Stable and doing well with topical Voltaren.  DME cane ordered for her today.

## 2018-02-09 ENCOUNTER — Ambulatory Visit
Admission: RE | Admit: 2018-02-09 | Discharge: 2018-02-09 | Disposition: A | Discharge status: Home / Self Care | Payer: Medicare HMO | Source: Ambulatory Visit | Attending: Family Medicine | Admitting: Family Medicine

## 2018-02-09 DIAGNOSIS — Z1231 Encounter for screening mammogram for malignant neoplasm of breast: Secondary | ICD-10-CM

## 2018-03-16 ENCOUNTER — Telehealth: Payer: Self-pay | Admitting: Family Medicine

## 2018-03-16 DIAGNOSIS — C221 Intrahepatic bile duct carcinoma: Secondary | ICD-10-CM

## 2018-03-16 NOTE — Telephone Encounter (Signed)
Spoke with patient and she states that she is experiencing a bad cough with a lot of mucus.  She would like to speak with PCP about getting an antibiotic called in.  Also she would like to speak with Dr. Shawna Orleans regarding her insurance and her treatments.  Goddess Gebbia,CMA

## 2018-03-16 NOTE — Telephone Encounter (Signed)
Has been having nasal congestion with cough since last Monday, clear drainage. No fever, double worsening, shortness of breath, facial tenderness. Has been taking flonase and claritin without much relief. Discussed that could continue supportive care the next few days along with addition of mucinex but that if not improved should be seen at the end of this week given she is undergoing chemoradiation. Patient voiced good understanding. Also requested referral for rad onc at [facility] where she has been getting treatments.

## 2018-03-16 NOTE — Telephone Encounter (Signed)
Pt is calling and has something she needs to talk about to Dr. Shawna Orleans. She didn't not want to talk about it or give me details. jw

## 2018-03-25 ENCOUNTER — Telehealth: Payer: Self-pay | Admitting: *Deleted

## 2018-03-25 DIAGNOSIS — C221 Intrahepatic bile duct carcinoma: Secondary | ICD-10-CM

## 2018-03-25 NOTE — Telephone Encounter (Signed)
Attempted to call patient. She had mentioned at a recent visit that she was having difficulty with insurance coverage for following with the cancer doctor at [facility]. Referral to [facility] was processed on 03/25/18. Referral to onc placed today in case this is still an issue.

## 2018-03-25 NOTE — Telephone Encounter (Signed)
Patient states that she discussed finding a specialist in [place] that "deals with liver cancer" with Dr. Shawna Orleans.  She would like for Dr. Shawna Orleans to call her back to followup. Hosam Mcfetridge, Salome Spotted, CMA

## 2018-04-10 NOTE — Telephone Encounter (Signed)
Pt LVM on nurse line stating she wanted to let PCP know she is able to go to [facility] for care.   Pt also requested PCP to call her.

## 2018-04-13 NOTE — Telephone Encounter (Signed)
Called patient. She expressed appreciation. No concerns.

## 2018-04-20 ENCOUNTER — Encounter: Payer: Self-pay | Admitting: Primary Care

## 2018-04-20 ENCOUNTER — Ambulatory Visit (INDEPENDENT_AMBULATORY_CARE_PROVIDER_SITE_OTHER): Payer: Medicare HMO | Admitting: Primary Care

## 2018-04-20 VITALS — BP 140/68 | HR 94 | Temp 98.3°F | Ht 63.0 in | Wt 177.4 lb

## 2018-04-20 DIAGNOSIS — J441 Chronic obstructive pulmonary disease with (acute) exacerbation: Secondary | ICD-10-CM | POA: Diagnosis not present

## 2018-04-20 MED ORDER — LEVALBUTEROL HCL 0.63 MG/3ML IN NEBU: 0.6300 mg | INHALATION_SOLUTION | RESPIRATORY_TRACT | Status: AC

## 2018-04-20 MED ORDER — ALBUTEROL SULFATE HFA 108 (90 BASE) MCG/ACT IN AERS: INHALATION_SPRAY | RESPIRATORY_TRACT | 0 refills | Status: DC

## 2018-04-20 MED ORDER — PREDNISONE 10 MG PO TABS: ORAL_TABLET | ORAL | 0 refills | Status: DC

## 2018-04-20 MED ORDER — DOXYCYCLINE HYCLATE 100 MG PO TABS: 100.0000 mg | ORAL_TABLET | Freq: Two times a day (BID) | ORAL | 0 refills | Status: DC

## 2018-04-20 MED ORDER — AEROCHAMBER MV MISC: 0 refills | Status: DC

## 2018-04-20 MED FILL — VENTOLIN HFA 90 MCG INHALER: 108 (90 BAS | 17 days supply | Qty: 18 | Fill #0

## 2018-04-20 MED FILL — DOXYCYCLINE HYCLATE 100 MG: 100 | 7 days supply | Qty: 14 | Fill #0

## 2018-04-20 MED FILL — predniSONE 10 MG TABS: 10 | 8 days supply | Qty: 20 | Fill #0

## 2018-04-20 NOTE — Patient Instructions (Addendum)
Seen today for COPD exacerbation   RX: Doxycyline 1 tab twice daily x 7 days Prednisone taper as prescribed Refilling Albuterol rescue inhaler, take 2 puffs every 4-6 hours for shortness of breath or wheezing  Recommendations: Mucinex tablet twice a day  Follow-up: 1-2 weeks with Beth NP  CONGRATULATIONS ON QUITTING SMOKING, KEEP UP YOUR AMAZING HARD WORK!!!!!    Chronic Obstructive Pulmonary Disease Exacerbation Chronic obstructive pulmonary disease (COPD) is a long-term (chronic) lung problem. In COPD, the flow of air from the lungs is limited. COPD exacerbations are times that breathing gets worse and you need more than your normal treatment. Without treatment, they can be life threatening. If they happen often, your lungs can become more damaged. If your COPD gets worse, your doctor may treat you with:  Medicines.  Oxygen.  Different ways to clear your airway, such as using a mask. Follow these instructions at home: Medicines  Take over-the-counter and prescription medicines only as told by your doctor.  If you take an antibiotic or steroid medicine, do not stop taking the medicine even if you start to feel better.  Keep up with shots (vaccinations) as told by your doctor. Be sure to get a yearly (annual) flu shot. Lifestyle  Do not smoke. If you need help quitting, ask your doctor.  Eat healthy foods.  Exercise regularly.  Get plenty of sleep.  Avoid tobacco smoke and other things that can bother your lungs.  Wash your hands often with soap and water. This will help keep you from getting an infection. If you cannot use soap and water, use hand sanitizer.  During flu season, avoid areas that are crowded with people. General instructions  Drink enough fluid to keep your pee (urine) clear or pale yellow. Do not do this if your doctor has told you not to.  Use a cool mist machine (vaporizer).  If you use oxygen or a machine that turns medicine into a mist  (nebulizer), continue to use it as told.  Follow all instructions for rehabilitation. These are steps you can take to make your body work better.  Keep all follow-up visits as told by your doctor. This is important. Contact a doctor if:  Your COPD symptoms get worse than normal. Get help right away if:  You are short of breath and it gets worse.  You have trouble talking.  You have chest pain.  You cough up blood.  You have a fever.  You keep throwing up (vomiting).  You feel weak or you pass out (faint).  You feel confused.  You are not able to sleep because of your symptoms.  You are not able to do daily activities. Summary  COPD exacerbations are times that breathing gets worse and you need more treatment than normal.  COPD exacerbations can be very serious and may cause your lungs to become more damaged.  Do not smoke. If you need help quitting, ask your doctor.  Stay up-to-date on your shots. Get a flu shot every year. This information is not intended to replace advice given to you by your health care provider. Make sure you discuss any questions you have with your health care provider. Document Released: 03/14/2011 Document Revised: 04/29/2016 Document Reviewed: 04/29/2016 Elsevier Interactive Patient Education  2019 [employer].

## 2018-04-20 NOTE — Progress Notes (Signed)
Chart and office note reviewed in detail  > agree with a/p as outlined    

## 2018-04-20 NOTE — Progress Notes (Signed)
@Patient  ID: Bonnie Peterson, female    DOB: 11-Feb-1946, 73 y.o.   MRN: [id number]  Chief Complaint  Patient presents with  . Acute Visit    cough with thick white and yellow, SOB with thightening of rib cage, wheezing x2 weeks    Referring provider: Bufford Lope, DO  HPI: 73 year old female, former smoker quit 04/13/2018. PMH significant for COPD. Patient of Dr. Melvyn Novas, last seen 05/13/17. Spirometry in 2018 showed moderate airway obstruction. Maintained on prn Albuterol hfa.   04/20/2018 Patient presents today for acute visit with complaints of cough, sob, chest tightness and wheezing x 2 weeks. Accompanied by family friend. Associated nasal congestion. Cough with productive clear to yellow thick mucus. She has albuterol rescue inhaler which is outdated from 2017. Taking mucinex and cough and cold medication for 2 week. States it calmed her symptoms down but returned worse.     Allergies  Allergen Reactions  . Other     diprovan- Anesthesia for MRI--made her "breathe funny"    Immunization History  Administered Date(s) Administered  . Influenza Split 02/06/2011, 12/23/2011  . Influenza Whole 01/21/2007, 12/28/2007, 01/31/2009, 02/14/2010  . Influenza, High Dose Seasonal PF 02/15/2015  . Influenza,inj,Quad PF,6+ Mos 03/24/2013, 01/03/2014, 01/19/2016, 01/03/2017, 01/26/2018  . Influenza-Unspecified 02/15/2015  . Pneumococcal Conjugate-13 03/24/2013  . Pneumococcal Polysaccharide-23 03/08/2004, 02/14/2010, 06/14/2015  . Td 06/06/2004  . Tdap 04/17/2016  . Zoster 11/08/2011    Past Medical History:  Diagnosis Date  . Anxiety   . Benign positional vertigo   . 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 COPD (chronic obstructive pulmonary disease) ([place])   . Depression   . Diabetes mellitus without complication ([place])   . Early cataracts, bilateral 10/13   Optho, Dr Gershon Crane  . Echocardiogram findings abnormal, without diagnosis 10/10   10/10: mild pulm HTN, EF  60-65%, mild LVH, moderate aortic regurg  . GERD (gastroesophageal reflux disease)    I have "acid reflux"  . Gout   . HLD (hyperlipidemia)   . HTN, goal below 130/80   . Irritable bowel syndrome   . Obesity, Class III, BMI 40-49.9 (morbid obesity) ([place])   . Obstructive sleep apnea    wears cpap  . Osteoarthritis (arthritis due to wear and tear of joints)    also gout  . Restrictive lung disease     Tobacco History: Social History   Tobacco Use  Smoking Status Former Smoker  . Packs/day: 0.50  . Years: 49.00  . Pack years: 24.50  . Types: Cigarettes  . Last attempt to quit: 04/13/2018  . Years since quitting: 0.0  Smokeless Tobacco Never Used   Counseling given: Not Answered   Outpatient Medications Prior to Visit  Medication Sig Dispense Refill  . alendronate (FOSAMAX) 70 MG tablet Take with a full glass of water on an empty stomach. 12 tablet 2  . allopurinol (ZYLOPRIM) 100 MG tablet Take 1 tablet (100 mg total) by mouth daily. 90 tablet 1  . amLODipine (NORVASC) 10 MG tablet Take 1 tablet (10 mg total) by mouth daily. 90 tablet 1  . atenolol (TENORMIN) 25 MG tablet Take 1 tablet (25 mg total) by mouth daily. 90 tablet 3  . cholecalciferol (VITAMIN D) 1000 units tablet Take 1 tablet (1,000 Units total) by mouth daily. 30 tablet 11  . diclofenac sodium (VOLTAREN) 1 % GEL Apply 2 g topically 4 (four) times daily as needed. 100 g 4  . fish oil-omega-3 fatty acids 1000  MG capsule Take 1 g by mouth every 30 (thirty) days.     . fluticasone (FLONASE) 50 MCG/ACT nasal spray Place 2 sprays into both nostrils daily as needed for rhinitis. 16 g 2  . hydrOXYzine (ATARAX/VISTARIL) 25 MG tablet Take 1 tablet (25 mg total) by mouth at bedtime. 20 tablet 0  . Hyprom-Naphaz-Polysorb-Zn Sulf (CLEAR EYES COMPLETE OP) Place 1 drop into both eyes daily as needed (dry eyes).     [name] lisinopril-hydrochlorothiazide (PRINZIDE,ZESTORETIC) 20-25 MG tablet Take 1 tablet by mouth daily. 90 tablet 3  .  loratadine (CLARITIN) 10 MG tablet Take 1 tablet (10 mg total) by mouth daily as needed for allergies. 30 tablet 3  . metFORMIN (GLUCOPHAGE) 500 MG tablet Take 1 tablet (500 mg total) by mouth daily with breakfast. 90 tablet 1  . Multiple Vitamin (MULTIVITAMIN WITH MINERALS) TABS tablet Take 1 tablet by mouth daily.    [name] omeprazole (PRILOSEC) 20 MG capsule Take 1 po BID x 2 weeks then once a day 60 capsule 0  . VITAMIN E PO Take 1 tablet by mouth daily.     [name] PROAIR HFA 108 (90 Base) MCG/ACT inhaler INHALE 2 PUFFS INTO THE LUNGS EVERY 4 HOURS AS NEEDED FOR WHEEZING OR SHORTNESS OF BREATH 1 Inhaler 0   No facility-administered medications prior to visit.     Review of Systems  Review of Systems  Constitutional: Negative.   HENT: Positive for congestion.   Respiratory: Positive for cough, shortness of breath and wheezing.   Cardiovascular: Negative.     Physical Exam  BP 140/68 (BP Location: Left Arm, Cuff Size: Normal)   Pulse 94   Temp 98.3 F (36.8 C)   Ht 5\' 3"  (1.6 m)   Wt 177 lb 6.4 oz (80.5 kg)   SpO2 95%   BMI 31.42 kg/m  Physical Exam Constitutional:      Appearance: Normal appearance. She is not ill-appearing.  HENT:     Head: Normocephalic and atraumatic.     Nose: Nose normal.     Mouth/Throat:     Mouth: Mucous membranes are moist.     Pharynx: Oropharynx is clear.  Cardiovascular:     Rate and Rhythm: Normal rate and regular rhythm.  Pulmonary:     Effort: Pulmonary effort is normal. No respiratory distress.     Breath sounds: Wheezing and rhonchi present.  Musculoskeletal: Normal range of motion.  Skin:    General: Skin is warm and dry.  Neurological:     General: No focal deficit present.     Mental Status: She is alert and oriented to person, place, and time. Mental status is at baseline.  Psychiatric:        Mood and Affect: Mood normal.        Behavior: Behavior normal.        Thought Content: Thought content normal.        Judgment: Judgment  normal.      Lab Results:  CBC    Component Value Date/Time   WBC 9.9 08/11/2017 1100   RBC 4.90 08/11/2017 1100   HGB 14.3 08/11/2017 1100   HGB 12.4 07/22/2017 1055   HGB 12.0 01/29/2017 1106   HCT 44.0 08/11/2017 1100   HCT 38.5 07/22/2017 1055   HCT 38.6 01/29/2017 1106   PLT 170 08/11/2017 1100   PLT 195 07/22/2017 1055   MCV 89.8 08/11/2017 1100   MCV 88 07/22/2017 1055   MCV 93.0 01/29/2017 1106   MCH  29.2 08/11/2017 1100   MCHC 32.5 08/11/2017 1100   RDW 14.8 08/11/2017 1100   RDW 14.9 07/22/2017 1055   RDW 14.0 01/29/2017 1106   LYMPHSABS 2.2 08/11/2017 1100   LYMPHSABS 2.2 07/22/2017 1055   LYMPHSABS 2.2 01/29/2017 1106   MONOABS 0.8 08/11/2017 1100   MONOABS 0.7 01/29/2017 1106   EOSABS 0.1 08/11/2017 1100   EOSABS 0.1 07/22/2017 1055   BASOSABS 0.0 08/11/2017 1100   BASOSABS 0.0 07/22/2017 1055   BASOSABS 0.0 01/29/2017 1106    BMET    Component Value Date/Time   NA 140 08/11/2017 1100   NA 138 07/22/2017 1055   NA 140 01/29/2017 1106   K 5.0 08/11/2017 1100   K 4.1 01/29/2017 1106   CL 103 08/11/2017 1100   CO2 26 08/11/2017 1100   CO2 29 01/29/2017 1106   GLUCOSE 149 (H) 08/11/2017 1100   GLUCOSE 144 (H) 01/29/2017 1106   BUN 16 08/11/2017 1100   BUN 11 07/22/2017 1055   BUN 15.4 01/29/2017 1106   CREATININE 0.90 08/11/2017 1100   CREATININE 0.8 01/29/2017 1106   CALCIUM 9.8 08/11/2017 1100   CALCIUM 9.8 01/29/2017 1106   GFRNONAA >60 08/11/2017 1100   GFRNONAA >89 06/19/2016 1432   GFRAA >60 08/11/2017 1100   GFRAA >89 06/19/2016 1432    BNP No results found for: BNP  ProBNP    Component Value Date/Time   PROBNP 39.3 05/28/2010 2045    Imaging: No results found.   Assessment & Plan:   Chronic obstructive pulmonary disease ([place]) - Acute exacerbation  - Recently quit smoking this month - LS with scattered insp/exp wheeze, rhonchi. VSS, 02 sat 95% RA. Afebrile.  - Xopenex neb x1 today, patient reports feeling a "whole lot  better" after - RX doxycyline 1 tab BID x 7 days and prednisone taper as prescribed  - Refilling Albuterol rescue inhaler, advised 2 puffs every 4-6 hrs prn sob/wheeze - May need daily maintenance inhaler regimen  - Follow up in 1 week, consider re-checking office [place], NP 04/20/2018

## 2018-04-20 NOTE — Addendum Note (Signed)
Addended by: Karmen Stabs on: 04/20/2018 02:22 PM   Modules accepted: Orders

## 2018-04-20 NOTE — Assessment & Plan Note (Signed)
-   Acute exacerbation  - Recently quit smoking this month - LS with scattered insp/exp wheeze, rhonchi. VSS, 02 sat 95% RA. Afebrile.  - Xopenex neb x1 today, patient reports feeling a "whole lot better" after - RX doxycyline 1 tab BID x 7 days and prednisone taper as prescribed  - Refilling Albuterol rescue inhaler, advised 2 puffs every 4-6 hrs prn sob/wheeze - May need daily maintenance inhaler regimen  - Follow up in 1 week, consider re-checking office Spirometry

## 2018-04-22 MED ORDER — LEVALBUTEROL HCL 0.63 MG/3ML IN NEBU
0.6300 mg | INHALATION_SOLUTION | RESPIRATORY_TRACT | Status: AC
  Administered 2018-04-20: 0.63 mg via RESPIRATORY_TRACT

## 2018-04-22 NOTE — Addendum Note (Signed)
Addended by: Karmen Stabs on: 04/22/2018 10:15 AM   Modules accepted: Orders

## 2018-05-04 ENCOUNTER — Ambulatory Visit: Payer: Medicare HMO | Admitting: Primary Care

## 2018-05-04 ENCOUNTER — Ambulatory Visit (INDEPENDENT_AMBULATORY_CARE_PROVIDER_SITE_OTHER): Payer: Medicare HMO | Admitting: Adult Health

## 2018-05-04 ENCOUNTER — Encounter: Payer: Self-pay | Admitting: Adult Health

## 2018-05-04 VITALS — BP 152/68 | HR 83 | Ht 63.0 in | Wt 187.8 lb

## 2018-05-04 DIAGNOSIS — J449 Chronic obstructive pulmonary disease, unspecified: Secondary | ICD-10-CM

## 2018-05-04 DIAGNOSIS — J441 Chronic obstructive pulmonary disease with (acute) exacerbation: Secondary | ICD-10-CM | POA: Diagnosis not present

## 2018-05-04 DIAGNOSIS — Z720 Tobacco use: Secondary | ICD-10-CM

## 2018-05-04 DIAGNOSIS — R918 Other nonspecific abnormal finding of lung field: Secondary | ICD-10-CM | POA: Diagnosis not present

## 2018-05-04 NOTE — Progress Notes (Signed)
@Patient  ID: Bonnie Peterson, female    DOB: [birth year], 73 y.o.   MRN: [id number]  Chief Complaint  Patient presents with  . Follow-up    COPD    Referring provider: Bufford Lope, DO  HPI: 73 year old female former smoker (04/13/2018) followed for moderate COPD and Lung nodules  Medical history significant for liver cancer (Followed at [facility] post resection and chemo/radiation), HTN  Obstructive sleep apnea previously on CPAP discontinued in 2017 after weight loss.   TEST/EVENTS :  Spirometry 05/13/2016  FEV1 1.03 (58%)  Ratio 70 mild curvature  NPSG 2010:  AHI 15/hr with desat to 75% Pt d/c'd cpap 04/2015 with wt loss > [place] 05/13/2016 = 6 off cpap  CT chest 04/2018 >stable pulmonary nodules, slight increase in right hilar lymph node 1.2 cm no other lymphadenopathy.  Tree-in-bud nodularity in the right upper lobe with mucous plugging.  CT chest 07/2017 Emphysema with bronchial wall thickening noted bilaterally. New 5 mm subpleural/perifissural nodule noted anterior right lun 6 mm posterior right lower lobe pulmonary nodule identifiedpreviously is stable on image 79/11. 4 mm right lower lobe nodule  is unchanged.  01/2016 Emphysema as before. 7 x 7 mm posterior right lower lobe pulmonary nodule is now 7 x 6 mm (image 70 series 4). Linear scarring right middle lobe is unchanged  05/04/2018 Follow up : COPD  Patient returns for a 2-week follow-up.  Patient was seen earlier this month for a COPD exacerbation.  She was given doxycycline for 7 days, prednisone taper. Since last visit patient is feeling better with decreased cough and congestion.  Still has some lingering drainage which Claritin seems to be helping with.  She denies any chest pain orthopnea PND or increased leg swelling.  No increased pro-air use. Appetite is good with no nausea vomiting or diarrhea.  She is on ACE inhibitor.  Quit smoking earlier this year.  Patient has a history of intrahepatic cholangiocarcinoma  .  She is followed at [facility].  Had a recent CT chest as part of her surveillance work-up.  Care everywhere notes were reviewed.  CT earlier this month shows stable pulmonary nodules.  There was a slight increase in the right hilar lymph node at 1.2 cm.  No other lymphadenopathy.  And there was tree-in-bud nodularity in the right upper lobe with mucous plugging.  This was done along the same time that she was treated for an acute bronchitis as above with antibiotics.  She denies any fever or hemoptysis.    Allergies  Allergen Reactions  . Other     diprovan- Anesthesia for MRI--made her "breathe funny"    Immunization History  Administered Date(s) Administered  . Influenza Split 02/06/2011, 12/23/2011  . Influenza Whole 01/21/2007, 12/28/2007, 01/31/2009, 02/14/2010  . Influenza, High Dose Seasonal PF 02/15/2015  . Influenza,inj,Quad PF,6+ Mos 03/24/2013, 01/03/2014, 01/19/2016, 01/03/2017, 01/26/2018  . Influenza-Unspecified 02/15/2015  . Pneumococcal Conjugate-13 03/24/2013  . Pneumococcal Polysaccharide-23 03/08/2004, 02/14/2010, 06/14/2015  . Td 06/06/2004  . Tdap 04/17/2016  . Zoster 11/08/2011    Past Medical History:  Diagnosis Date  . Anxiety   . Benign positional vertigo   . Cancer ([place]) 02/09/15   intrahepatic cholangiocarcinoma  . COPD (chronic obstructive pulmonary disease) ([place])   . Depression   . Diabetes mellitus without complication ([place])   . Early cataracts, bilateral 10/13   Optho, Dr Gershon Crane  . Echocardiogram findings abnormal, without diagnosis 10/10   10/10: mild pulm HTN, EF 60-65%, mild LVH, moderate aortic  regurg  . GERD (gastroesophageal reflux disease)    I have "acid reflux"  . Gout   . HLD (hyperlipidemia)   . HTN, goal below 130/80   . Irritable bowel syndrome   . Obesity, Class III, BMI 40-49.9 (morbid obesity) ([place])   . Obstructive sleep apnea    wears cpap  . Osteoarthritis (arthritis due to wear and tear of joints)    also  gout  . Restrictive lung disease     Tobacco History: Social History   Tobacco Use  Smoking Status Former Smoker  . Packs/day: 0.50  . Years: 49.00  . Pack years: 24.50  . Types: Cigarettes  . Last attempt to quit: 04/13/2018  . Years since quitting: 0.0  Smokeless Tobacco Never Used   Counseling given: Not Answered   Outpatient Medications Prior to Visit  Medication Sig Dispense Refill  . albuterol (PROAIR HFA) 108 (90 Base) MCG/ACT inhaler INHALE 2 PUFFS INTO THE LUNGS EVERY 4 HOURS AS NEEDED FOR WHEEZING OR SHORTNESS OF BREATH 1 Inhaler 0  . alendronate (FOSAMAX) 70 MG tablet Take with a full glass of water on an empty stomach. 12 tablet 2  . allopurinol (ZYLOPRIM) 100 MG tablet Take 1 tablet (100 mg total) by mouth daily. 90 tablet 1  . amLODipine (NORVASC) 10 MG tablet Take 1 tablet (10 mg total) by mouth daily. 90 tablet 1  . atenolol (TENORMIN) 25 MG tablet Take 1 tablet (25 mg total) by mouth daily. 90 tablet 3  . cholecalciferol (VITAMIN D) 1000 units tablet Take 1 tablet (1,000 Units total) by mouth daily. 30 tablet 11  . diclofenac sodium (VOLTAREN) 1 % GEL Apply 2 g topically 4 (four) times daily as needed. 100 g 4  . fish oil-omega-3 fatty acids 1000 MG capsule Take 1 g by mouth every 30 (thirty) days.     . fluticasone (FLONASE) 50 MCG/ACT nasal spray Place 2 sprays into both nostrils daily as needed for rhinitis. 16 g 2  . hydrOXYzine (ATARAX/VISTARIL) 25 MG tablet Take 1 tablet (25 mg total) by mouth at bedtime. 20 tablet 0  . Hyprom-Naphaz-Polysorb-Zn Sulf (CLEAR EYES COMPLETE OP) Place 1 drop into both eyes daily as needed (dry eyes).     [name] lisinopril-hydrochlorothiazide (PRINZIDE,ZESTORETIC) 20-25 MG tablet Take 1 tablet by mouth daily. 90 tablet 3  . loratadine (CLARITIN) 10 MG tablet Take 1 tablet (10 mg total) by mouth daily as needed for allergies. 30 tablet 3  . metFORMIN (GLUCOPHAGE) 500 MG tablet Take 1 tablet (500 mg total) by mouth daily with breakfast. 90  tablet 1  . Multiple Vitamin (MULTIVITAMIN WITH MINERALS) TABS tablet Take 1 tablet by mouth daily.    [name] omeprazole (PRILOSEC) 20 MG capsule Take 1 po BID x 2 weeks then once a day 60 capsule 0  . Spacer/Aero-Holding Chambers (AEROCHAMBER MV) inhaler Use as instructed 1 each 0  . VITAMIN E PO Take 1 tablet by mouth daily.     [name] doxycycline (VIBRA-TABS) 100 MG tablet Take 1 tablet (100 mg total) by mouth 2 (two) times daily. (Patient not taking: Reported on 05/04/2018) 14 tablet 0  . predniSONE (DELTASONE) 10 MG tablet Take 4 tabs po daily x 2 days; then 3 tabs for 2 days; then 2 tabs for 2 days; then 1 tab for 2 days (Patient not taking: Reported on 05/04/2018) 20 tablet 0   No facility-administered medications prior to visit.      Review of Systems:   Constitutional:  No  weight loss, night sweats,  Fevers, chills, + fatigue, or  lassitude.  HEENT:   No headaches,  Difficulty swallowing,  Tooth/dental problems, or  Sore throat,                No sneezing, itching, ear ache, nasal congestion, post nasal drip,   CV:  No chest pain,  Orthopnea, PND, swelling in lower extremities, anasarca, dizziness, palpitations, syncope.   GI  No heartburn, indigestion, abdominal pain, nausea, vomiting, diarrhea, change in bowel habits, loss of appetite, bloody stools.   Resp:   No chest wall deformity  Skin: no rash or lesions.  GU: no dysuria, change in color of urine, no urgency or frequency.  No flank pain, no hematuria   MS:  No joint pain or swelling.  No decreased range of motion.  No back pain.    Physical Exam  BP (!) 152/68 (BP Location: Left Arm, Cuff Size: Normal)   Pulse 83   Ht 5' 3"  (1.6 m)   Wt 187 lb 12.8 oz (85.2 kg)   SpO2 98%   BMI 33.27 kg/m   GEN: A/Ox3; pleasant , NAD, obese    HEENT:  [place]/AT,  EACs-clear, TMs-wnl, NOSE-clear, THROAT-clear, no lesions, no postnasal drip or exudate noted.   NECK:  Supple w/ fair ROM; no JVD; normal carotid impulses w/o bruits; no  thyromegaly or nodules palpated; no lymphadenopathy.    RESP  Clear  P & A; w/o, wheezes/ rales/ or rhonchi. no accessory muscle use, no dullness to percussion  CARD:  RRR, no m/r/g, tr to none peripheral edema, pulses intact, no cyanosis or clubbing.  GI:   Soft & nt; nml bowel sounds; no organomegaly or masses detected.   Musco: Warm bil, no deformities or joint swelling noted.   Neuro: alert, no focal deficits noted.    Skin: Warm, no lesions or rashes    Lab Results:  CBC  BMET  No flowsheet data found.  No results found for: NITRICOXIDE      Assessment & Plan:   Chronic obstructive pulmonary disease ([place]) Recent flare with bronchitis  Now improving .  If cough persists may need to change ACE  Control for triggers. Check PFT on return   Plan  Patient Instructions  Mucinex DM Twice daily  As needed  Cough/congestion .  Claritin 76m daily  If cough persists may need to discuss with Primary MD that Prinizide may be aggravating your cough .  ProAir inhaler 2 puffs every 4hrs as needed for wheezing .  Follow up in 3 months with PFT and As needed   Please contact office for sooner follow up if symptoms do not improve or worsen or seek emergency care       Tobacco use Quit smoking earlier this month , cont w/ cessation    Lung nodules Lung nodules stable on recent CT chest  CT are done as part of surveillance for liver cancer .  Continue to follow .       TRexene Edison NP 05/04/2018

## 2018-05-04 NOTE — Patient Instructions (Addendum)
Mucinex DM Twice daily  As needed  Cough/congestion .  Claritin 103m daily  If cough persists may need to discuss with Primary MD that Prinizide may be aggravating your cough .  ProAir inhaler 2 puffs every 4hrs as needed for wheezing .  Follow up in 3 months with PFT and As needed   Please contact office for sooner follow up if symptoms do not improve or worsen or seek emergency care

## 2018-05-04 NOTE — Assessment & Plan Note (Signed)
Lung nodules stable on recent CT chest  CT are done as part of surveillance for liver cancer .  Continue to follow .

## 2018-05-04 NOTE — Assessment & Plan Note (Signed)
Quit smoking earlier this month , cont w/ cessation

## 2018-05-04 NOTE — Assessment & Plan Note (Signed)
Recent flare with bronchitis  Now improving .  If cough persists may need to change ACE  Control for triggers. Check PFT on return   Plan  Patient Instructions  Mucinex DM Twice daily  As needed  Cough/congestion .  Claritin 10mg  daily  If cough persists may need to discuss with Primary MD that Prinizide may be aggravating your cough .  ProAir inhaler 2 puffs every 4hrs as needed for wheezing .  Follow up in 3 months with PFT and As needed   Please contact office for sooner follow up if symptoms do not improve or worsen or seek emergency care

## 2018-05-18 NOTE — Progress Notes (Signed)
Chart and office note reviewed in detail  > agree with a/p as outlined    

## 2018-06-03 ENCOUNTER — Other Ambulatory Visit: Payer: Self-pay

## 2018-06-03 ENCOUNTER — Ambulatory Visit (INDEPENDENT_AMBULATORY_CARE_PROVIDER_SITE_OTHER): Payer: Medicare HMO | Admitting: Family Medicine

## 2018-06-03 ENCOUNTER — Encounter: Payer: Self-pay | Admitting: Family Medicine

## 2018-06-03 VITALS — BP 148/60 | HR 65 | Temp 98.3°F | Ht 63.0 in | Wt 190.0 lb

## 2018-06-03 DIAGNOSIS — E119 Type 2 diabetes mellitus without complications: Secondary | ICD-10-CM

## 2018-06-03 DIAGNOSIS — I10 Essential (primary) hypertension: Secondary | ICD-10-CM | POA: Diagnosis not present

## 2018-06-03 DIAGNOSIS — K219 Gastro-esophageal reflux disease without esophagitis: Secondary | ICD-10-CM

## 2018-06-03 LAB — POCT GLYCOSYLATED HEMOGLOBIN (HGB A1C): HbA1c, POC (controlled diabetic range): 7.1 % — AB (ref 0.0–7.0)

## 2018-06-03 MED ORDER — FAMOTIDINE 20 MG PO TABS: 20.0000 mg | ORAL_TABLET | Freq: Two times a day (BID) | ORAL | 0 refills | Status: DC

## 2018-06-03 NOTE — Progress Notes (Signed)
    Subjective:  Bonnie Peterson is a 73 y.o. female who presents to the [facility] today for diabetes follow-up  HPI:  Diabetes Taking metformin 500 mg daily, compliant, tolerating well.  No hypoglycemic symptoms.  No polyuria.  No visual symptoms. Does not check blood sugars at home [facility] for diabetic eye exam is up-to-date  Hypertension Takes atenolol 25 mg daily Norvasc 10 mg daily, lisinopril 20 g daily, hydrochlorothiazide 25 mg daily.  Compliant, tolerating well. No chest pain, shortness of breath, lightheadedness, dizziness, lower extremity edema  Reflux Used to take omeprazole remotely in the past has not been on it for some time.  She has been having some more acid reflux symptoms and would like to retry being on a medication.     ROS: Per HPI  Social Hx: She reports that she quit smoking about 7 weeks ago. Her smoking use included cigarettes. She has a 24.50 pack-year smoking history. She has never used smokeless tobacco. She reports current alcohol use. She reports current drug use. Drug: Marijuana.   Objective:  Physical Exam: BP (!) 148/60   Pulse 65   Temp 98.3 F (36.8 C) (Oral)   Ht 5\' 3"  (1.6 m)   Wt 190 lb (86.2 kg)   SpO2 99%   BMI 33.66 kg/m   Gen: NAD, resting comfortably CV: RRR with no murmurs appreciated Pulm: NWOB, CTAB with no crackles, wheezes, or rhonchi GI: Normal bowel sounds present. Soft, Nontender, Nondistended. MSK: no edema, cyanosis, or clubbing noted Skin: warm, dry Neuro: grossly normal, moves all extremities Psych: Normal affect and thought content  Results for orders placed or performed in visit on 06/03/18 (from the past 72 hour(s))  HgB A1c     Status: Abnormal   Collection Time: 06/03/18 10:50 AM  Result Value Ref Range   Hemoglobin A1C     HbA1c POC (<> result, manual entry)     HbA1c, POC (prediabetic range)     HbA1c, POC (controlled diabetic range) 7.1 (A) 0.0 - 7.0 %     Assessment/Plan:  HYPERTENSION,  BENIGN SYSTEMIC Although patient systolic blood pressure is not at goal it is only slightly elevated and her diastolic is at 60.  She is doing well on her current medication regimen and any efforts to increase may outweigh the benefits. Continue atenolol 25 mg daily Norvasc 10 mg daily, lisinopril 20 g daily, hydrochlorothiazide 25 mg daily.   Type 2 diabetes mellitus without complication, without long-term current use of insulin (HCC) At goal for age.  Continue current management with metformin 500 mg daily.    Gastroesophageal reflux disease without esophagitis Trial of famotidine 20mg  BID   Bufford Lope, DO PGY-3, [facility] 06/03/2018 10:59 AM

## 2018-06-03 NOTE — Patient Instructions (Addendum)
You are due for colonoscopy this March please call Dr. [name]  [place], [place]: [phone] Fax: [phone]   Try pepcid (generic name famotidine) to see if it helps your acid reflux.   Diabetes and blood pressure looks good today, no changes.

## 2018-06-03 NOTE — Assessment & Plan Note (Signed)
Although patient systolic blood pressure is not at goal it is only slightly elevated and her diastolic is at 60.  She is doing well on her current medication regimen and any efforts to increase may outweigh the benefits. Continue atenolol 25 mg daily Norvasc 10 mg daily, lisinopril 20 g daily, hydrochlorothiazide 25 mg daily.

## 2018-06-03 NOTE — Assessment & Plan Note (Addendum)
Trial of famotidine 20mg BID

## 2018-06-03 NOTE — Assessment & Plan Note (Signed)
At goal for age.  Continue current management with metformin 500 mg daily.

## 2018-06-25 ENCOUNTER — Encounter: Payer: Self-pay | Admitting: Gastroenterology

## 2018-06-26 ENCOUNTER — Other Ambulatory Visit: Payer: Self-pay | Admitting: Family Medicine

## 2018-06-26 DIAGNOSIS — K219 Gastro-esophageal reflux disease without esophagitis: Secondary | ICD-10-CM

## 2018-06-30 ENCOUNTER — Telehealth: Payer: Self-pay

## 2018-06-30 NOTE — Telephone Encounter (Signed)
Pt called nurse line stating she would like to talk to Dr. Shawna Orleans. I asked patient if I could help her with anything, "I want to talk to Dr. Shawna Orleans, I need a reference for Medicaid/Medicare." Pt became annoyed. Will forward to PCP.

## 2018-07-01 ENCOUNTER — Telehealth (INDEPENDENT_AMBULATORY_CARE_PROVIDER_SITE_OTHER): Payer: Medicare HMO | Admitting: Family Medicine

## 2018-07-01 ENCOUNTER — Other Ambulatory Visit: Payer: Self-pay

## 2018-07-01 ENCOUNTER — Telehealth: Payer: Self-pay | Admitting: Family Medicine

## 2018-07-01 DIAGNOSIS — M1612 Unilateral primary osteoarthritis, left hip: Secondary | ICD-10-CM | POA: Diagnosis not present

## 2018-07-01 NOTE — Assessment & Plan Note (Signed)
Discussed with patient that would attempt telemedicine visit in place referral for home health.  Given the prevalence of COVID in the community would not advise a in person visit for this patient at this time.  Patient voiced good understanding that this may not meet requirements for face-to-face.

## 2018-07-01 NOTE — Progress Notes (Signed)
[place] Telemedicine Visit  Patient consented to have visit conducted via telephone.  Encounter participants: [name]  Provider: Bufford Lope  Others (if applicable): none  Chief Complaint: home health face to face  HPI:  Patient states that she recently lost her Medicaid and therefore lost her home aide.  She had a home aide that helped her with her ADLs particularly with bathing.  She has arthritis lots of joint pains and impedes her ability to perform some ADLs.  Her insurance company told her that her [facility] does provide some services.  ROS: per HPI  Pertinent PMHx: Intrahepatic cholangiocarcinoma, arthritis, hyperlipidemia, hypertension, COPD, GERD, GAD.  Assessment/Plan:  Osteoarthritis Discussed with patient that would attempt telemedicine visit in place referral for home health.  Given the prevalence of COVID in the community would not advise a in person visit for this patient at this time.  Patient voiced good understanding that this may not meet requirements for face-to-face.    Time spent on phone with patient: 10 minutes  Billing: yes  Bufford Lope, DO PGY-3, [facility] 07/01/2018 1:55 PM

## 2018-07-01 NOTE — Telephone Encounter (Signed)
Called patient. She lost medicaid and is now in spend down for medicare. She lost her [facility] aide and needs referral. See separate telemedicine encounter.

## 2018-07-01 NOTE — Telephone Encounter (Signed)
error 

## 2018-07-02 ENCOUNTER — Other Ambulatory Visit: Payer: Self-pay | Admitting: Family Medicine

## 2018-07-02 DIAGNOSIS — M810 Age-related osteoporosis without current pathological fracture: Secondary | ICD-10-CM

## 2018-07-07 ENCOUNTER — Telehealth: Payer: Self-pay | Admitting: *Deleted

## 2018-07-07 NOTE — Telephone Encounter (Signed)
Pt calls to let the provider know that she has changed her mind about the PCS at this time.  She would like to revisit at a later date once the covid-19 is over and done with.  She told the company to not come to assess today when they called. Christen Bame, CMA

## 2018-07-30 ENCOUNTER — Other Ambulatory Visit: Payer: Self-pay | Admitting: Family Medicine

## 2018-07-30 DIAGNOSIS — M1A9XX0 Chronic gout, unspecified, without tophus (tophi): Secondary | ICD-10-CM

## 2018-08-03 ENCOUNTER — Ambulatory Visit: Payer: Medicare HMO | Admitting: Adult Health

## 2018-08-07 ENCOUNTER — Other Ambulatory Visit: Payer: Self-pay | Admitting: Family Medicine

## 2018-08-07 DIAGNOSIS — E119 Type 2 diabetes mellitus without complications: Secondary | ICD-10-CM

## 2018-09-29 ENCOUNTER — Other Ambulatory Visit: Payer: Self-pay | Admitting: Family Medicine

## 2018-09-29 DIAGNOSIS — K219 Gastro-esophageal reflux disease without esophagitis: Secondary | ICD-10-CM

## 2018-10-12 ENCOUNTER — Telehealth: Payer: Self-pay | Admitting: Adult Health

## 2018-10-12 ENCOUNTER — Encounter: Payer: Self-pay | Admitting: Acute Care

## 2018-10-12 ENCOUNTER — Ambulatory Visit (INDEPENDENT_AMBULATORY_CARE_PROVIDER_SITE_OTHER): Payer: Medicare HMO | Admitting: Acute Care

## 2018-10-12 ENCOUNTER — Other Ambulatory Visit: Payer: Self-pay

## 2018-10-12 DIAGNOSIS — R918 Other nonspecific abnormal finding of lung field: Secondary | ICD-10-CM | POA: Diagnosis not present

## 2018-10-12 DIAGNOSIS — Z720 Tobacco use: Secondary | ICD-10-CM

## 2018-10-12 DIAGNOSIS — C221 Intrahepatic bile duct carcinoma: Secondary | ICD-10-CM | POA: Diagnosis not present

## 2018-10-12 DIAGNOSIS — F17210 Nicotine dependence, cigarettes, uncomplicated: Secondary | ICD-10-CM | POA: Diagnosis not present

## 2018-10-12 DIAGNOSIS — J441 Chronic obstructive pulmonary disease with (acute) exacerbation: Secondary | ICD-10-CM | POA: Diagnosis not present

## 2018-10-12 NOTE — Assessment & Plan Note (Signed)
For follow up at [facility] this week. Plan Follow up at [facility] for blood work and imaging as is scheduled.

## 2018-10-12 NOTE — Assessment & Plan Note (Signed)
Stable on follow up 04/2018 Plan We will have [facility] send [place] the results of this weeks imaging Follow up CT Chest 04/2019

## 2018-10-12 NOTE — Assessment & Plan Note (Addendum)
COPD Last flare 04/2018 Stable interval Last use of Pro Air 04/2018 Plan We will schedule you for PFT's in 3 months at your follow up appointment Continue your Claritin and Flonase daily as you have been doing  Use your ProAir  1-2 puffs as needed for shortness of breath or wheezing. Note your daily symptoms > remember "red flags" for COPD:  Increase in cough, increase in sputum production, increase in shortness of breath or activity intolerance. If you notice these symptoms, please call to be seen.   Please contact office for sooner follow up if symptoms do not improve or worsen or seek emergency care

## 2018-10-12 NOTE — Assessment & Plan Note (Signed)
Remains smoke free since 04/2018 Plan Congratulations on remaining smoke free.  Keep up the good work. I have spent 3 minutes counseling patient on continued smoking cessation this visit.

## 2018-10-12 NOTE — Progress Notes (Signed)
History of Present Illness Bonnie Peterson is a 73 y.o. female former smoker (04/13/2018) followed for moderate COPD and Lung,.nodules . She is followed by Dr. Melvyn Novas and Rexene Edison, NP. Medical history significant for liver cancer (Followed at [facility] post resection and chemo/radiation), HTN  Obstructive sleep apnea previously on CPAP discontinued in 2017 after weight loss. She is maintained on Albuterol HFA.   10/12/2018 3 month follow up Pt. Presents for 3 month follow up.Last flare was 04/2018. She was treated with Doxycycline x 7 days and a prednisone  Taper. She is currently not on maintenance medication. She does use albuterol HFA as needed.  She states she has been doing well. She has decreased cough and congestion. She is compliant with her Flonase and Claritin. She goes to [facility] tomorrow for her follow up there. This follow up of  her intrahepatic cholangiocarcinoma . CT chest 04/2018 shows stable pulmonary nodules.  There was a slight increase in the right hilar lymph node at 1.2 cm.  No other lymphadenopathy. She denies any fever, chest pain orthopnea or hemoptysis.She states she has not had to use her [employer] since January 2020. She remains smoke free . She is on an ACE inhibitor.  Test Results: Spirometry 2/5/2018FEV1 1.03 (58%) Ratio 70 mild curvature  NPSG 2010: AHI 15/hr with desat to 75% Pt d/c'd cpap 04/2015 with wt loss >[place] 05/13/2016= 6 off cpap  CT chest 04/2018 >stable pulmonary nodules, slight increase in right hilar lymph node 1.2 cm no other lymphadenopathy.  Tree-in-bud nodularity in the right upper lobe with mucous plugging.  CT chest 07/2017 Emphysema with bronchial wall thickening noted bilaterally. New 5 mm subpleural/perifissural nodule noted anterior right lun 6 mm posterior right lower lobe pulmonary nodule identifiedpreviously is stable on image 79/11. 4 mm right lower lobe nodule  is unchanged.  01/2016 Emphysema as before. 7 x 7 mm posterior right  lower lobe pulmonary nodule is now 7 x 6 mm (image 70 series 4). Linear scarring right middle lobe is unchanged   CBC Latest Ref Rng & Units 08/11/2017 07/22/2017 05/06/2017  WBC 4.0 - 10.5 K/uL 9.9 7.4 7.9  Hemoglobin 12.0 - 15.0 g/dL 14.3 12.4 12.4  Hematocrit 36.0 - 46.0 % 44.0 38.5 39.9  Platelets 150 - 400 K/uL 170 195 146    BMP Latest Ref Rng & Units 08/11/2017 07/22/2017 05/06/2017  Glucose 65 - 99 mg/dL 149(H) 184(H) 231(H)  BUN 6 - 20 mg/dL 16 11 11   Creatinine 0.44 - 1.00 mg/dL 0.90 0.84 0.85  BUN/Creat Ratio 12 - 28 - 13 -  Sodium 135 - 145 mmol/L 140 138 137  Potassium 3.5 - 5.1 mmol/L 5.0 4.6 3.8  Chloride 101 - 111 mmol/L 103 98 101  CO2 22 - 32 mmol/L 26 28 28   Calcium 8.9 - 10.3 mg/dL 9.8 9.8 9.2    BNP No results found for: BNP  ProBNP    Component Value Date/Time   PROBNP 39.3 05/28/2010 2045    PFT No results found for: FEV1PRE, FEV1POST, FVCPRE, FVCPOST, TLC, DLCOUNC, PREFEV1FVCRT, PSTFEV1FVCRT  No results found.   Past medical hx Past Medical History:  Diagnosis Date  . Anxiety   . Benign positional vertigo   . Cancer ([place]) 02/09/15   intrahepatic cholangiocarcinoma  . COPD (chronic obstructive pulmonary disease) ([place])   . Depression   . Diabetes mellitus without complication ([place])   . Early cataracts, bilateral 10/13   Optho, Dr Gershon Crane  . Echocardiogram findings abnormal, without diagnosis  10/10   10/10: mild pulm HTN, EF 60-65%, mild LVH, moderate aortic regurg  . GERD (gastroesophageal reflux disease)    I have "acid reflux"  . Gout   . HLD (hyperlipidemia)   . HTN, goal below 130/80   . Irritable bowel syndrome   . Obesity, Class III, BMI 40-49.9 (morbid obesity) ([place])   . Obstructive sleep apnea    wears cpap  . Osteoarthritis (arthritis due to wear and tear of joints)    also gout  . Restrictive lung disease      Social History   Tobacco Use  . Smoking status: Former Smoker    Packs/day: 0.50    Years: 49.00    Pack years:  24.50    Types: Cigarettes    Quit date: 04/13/2018    Years since quitting: 0.4  . Smokeless tobacco: Never Used  Substance Use Topics  . Alcohol use: Yes    Alcohol/week: 0.0 standard drinks    Comment: occasionally  . Drug use: Yes    Types: Marijuana    Bonnie Peterson reports that she quit smoking about 5 months ago. Her smoking use included cigarettes. She has a 24.50 pack-year smoking history. She has never used smokeless tobacco. She reports current alcohol use. She reports current drug use. Drug: Marijuana.  Tobacco Cessation: Former smoker , Quit 04/2018 with a 24.5 pack year smoking history  Past surgical hx, Family hx, Social hx all reviewed.  Current Outpatient Medications on File Prior to Visit  Medication Sig  . albuterol (PROAIR HFA) 108 (90 Base) MCG/ACT inhaler INHALE 2 PUFFS INTO THE LUNGS EVERY 4 HOURS AS NEEDED FOR WHEEZING OR SHORTNESS OF BREATH  . alendronate (FOSAMAX) 70 MG tablet TAKE 1 TABLET  ONCE A WEEK.  [name] allopurinol (ZYLOPRIM) 100 MG tablet TAKE 1 TABLET (100 MG TOTAL) BY MOUTH DAILY.  [name] amLODipine (NORVASC) 10 MG tablet Take 1 tablet (10 mg total) by mouth daily.  [name] atenolol (TENORMIN) 25 MG tablet Take 1 tablet (25 mg total) by mouth daily.  . cholecalciferol (VITAMIN D) 1000 units tablet Take 1 tablet (1,000 Units total) by mouth daily.  . diclofenac sodium (VOLTAREN) 1 % GEL Apply 2 g topically 4 (four) times daily as needed.  . famotidine (PEPCID) 20 MG tablet Take 1 tablet (20 mg total) by mouth 2 (two) times daily.  . fish oil-omega-3 fatty acids 1000 MG capsule Take 1 g by mouth every 30 (thirty) days.   . fluticasone (FLONASE) 50 MCG/ACT nasal spray Place 2 sprays into both nostrils daily as needed for rhinitis.  . Hyprom-Naphaz-Polysorb-Zn Sulf (CLEAR EYES COMPLETE OP) Place 1 drop into both eyes daily as needed (dry eyes).   [name] lisinopril-hydrochlorothiazide (PRINZIDE,ZESTORETIC) 20-25 MG tablet Take 1 tablet by mouth daily.  [name] loratadine (CLARITIN) 10  MG tablet Take 1 tablet (10 mg total) by mouth daily as needed for allergies.  . metFORMIN (GLUCOPHAGE) 500 MG tablet TAKE 1 TABLET (500 MG TOTAL) BY MOUTH DAILY WITH BREAKFAST.  . Multiple Vitamin (MULTIVITAMIN WITH MINERALS) TABS tablet Take 1 tablet by mouth daily.  [name] Spacer/Aero-Holding Chambers (AEROCHAMBER MV) inhaler Use as instructed  . VITAMIN E PO Take 1 tablet by mouth daily.    No current facility-administered medications on file prior to visit.      Allergies  Allergen Reactions  . Other     diprovan- Anesthesia for MRI--made her "breathe funny"    Review Of Systems:  Constitutional:   No  weight loss, night sweats,  Fevers, chills, fatigue, or  lassitude.  HEENT:   No headaches,  Difficulty swallowing,  Tooth/dental problems, or  Sore throat,                No sneezing, itching, ear ache, nasal congestion, post nasal drip,   CV:  No chest pain,  Orthopnea, PND, swelling in lower extremities, anasarca, dizziness, palpitations, syncope.   GI  No heartburn, indigestion, abdominal pain, nausea, vomiting, diarrhea, change in bowel habits, loss of appetite, bloody stools.   Resp: Rare shortness of breath with exertion none at rest.  No excess mucus, no productive cough,  No non-productive cough,  No coughing up of blood.  No change in color of mucus.  No wheezing.  No chest wall deformity  Skin: no rash or lesions.  GU: no dysuria, change in color of urine, no urgency or frequency.  No flank pain, no hematuria   MS:  No joint pain or swelling.  No decreased range of motion.  No back pain.  Psych:  No change in mood or affect. No depression or anxiety.  No memory loss.   Vital Signs BP 132/72 (BP Location: Left Arm, Cuff Size: Normal)   Pulse 75   Temp 98.3 F (36.8 C) (Oral)   Ht 5\' 3"  (1.6 m)   Wt 199 lb (90.3 kg)   SpO2 99%   BMI 35.25 kg/m    Physical Exam:  General- No distress,  A&Ox3, pleasant ENT: No sinus tenderness, TM clear, pale nasal mucosa, no  oral exudate,no post nasal drip, no LAN Cardiac: S1, S2, regular rate and rhythm, no murmur Chest: No wheeze/ rales/ dullness; no accessory muscle use, no nasal flaring, no sternal retractions Abd.: Soft Non-tender, ND, BS +. Body mass index is 35.25 kg/m. Ext: No clubbing cyanosis, edema Neuro:  normal strength, MAE x 4, A&O x 3 Skin: No rashes, No lesions, warm and dry Psych: normal mood and behavior   Assessment/Plan  Chronic obstructive pulmonary disease (HCC) COPD Last flare 04/2018 Stable interval Last use of Pro Air 04/2018 Plan We will schedule you for PFT's in 3 months at your follow up appointment Continue your Claritin and Flonase daily as you have been doing  Use your ProAir  1-2 puffs as needed for shortness of breath or wheezing. Note your daily symptoms > remember "red flags" for COPD:  Increase in cough, increase in sputum production, increase in shortness of breath or activity intolerance. If you notice these symptoms, please call to be seen.   Please contact office for sooner follow up if symptoms do not improve or worsen or seek emergency care   Intrahepatic cholangiocarcinoma ([place]) For follow up at [facility] this week. Plan Follow up at [facility] for blood work and imaging as is scheduled.  Tobacco use Remains smoke free since 04/2018 Plan Congratulations on remaining smoke free.  Keep up the good work. I have spent 3 minutes counseling patient on continued smoking cessation this visit.   Lung nodules Stable on follow up 04/2018 Plan We will have [facility] send [place] the results of this weeks imaging Follow up CT Chest 04/2019    Magdalen Spatz, NP 10/12/2018  12:46 PM

## 2018-10-12 NOTE — Patient Instructions (Addendum)
It is good to see you today.  We will do a 3 month follow up appointment with Rexene Edison NP or Murna Backer NP with PFT's prior.  Continue your Claritin and Flonase daily as you have been doing  Use your ProAir  1-2 puffs as needed for shortness of breath or wheezing. Bonnie Peterson with your appointment at [facility].  Call us if you need [place] sooner.  Please contact office for sooner follow up if symptoms do not improve or worsen or seek emergency care

## 2018-10-13 DIAGNOSIS — C221 Intrahepatic bile duct carcinoma: Secondary | ICD-10-CM | POA: Diagnosis not present

## 2018-10-13 DIAGNOSIS — I81 Portal vein thrombosis: Secondary | ICD-10-CM | POA: Diagnosis not present

## 2018-10-13 DIAGNOSIS — J9811 Atelectasis: Secondary | ICD-10-CM | POA: Diagnosis not present

## 2018-10-13 DIAGNOSIS — M2578 Osteophyte, vertebrae: Secondary | ICD-10-CM | POA: Diagnosis not present

## 2018-10-13 DIAGNOSIS — J432 Centrilobular emphysema: Secondary | ICD-10-CM | POA: Diagnosis not present

## 2018-10-13 DIAGNOSIS — M47895 Other spondylosis, thoracolumbar region: Secondary | ICD-10-CM | POA: Diagnosis not present

## 2018-10-13 DIAGNOSIS — R970 Elevated carcinoembryonic antigen [CEA]: Secondary | ICD-10-CM | POA: Diagnosis not present

## 2018-10-13 DIAGNOSIS — R590 Localized enlarged lymph nodes: Secondary | ICD-10-CM | POA: Diagnosis not present

## 2018-10-13 NOTE — Telephone Encounter (Signed)
Seems like encounter was open in error so closing encounter.  

## 2018-10-14 DIAGNOSIS — C221 Intrahepatic bile duct carcinoma: Secondary | ICD-10-CM | POA: Diagnosis not present

## 2018-10-19 NOTE — Progress Notes (Signed)
Chart and office note reviewed in detail  > agree with a/p as outlined    

## 2018-10-22 ENCOUNTER — Telehealth: Payer: Self-pay | Admitting: *Deleted

## 2018-10-22 NOTE — Telephone Encounter (Signed)
Pt lm on nurse line requesting a call back (no other info).    Attempted to call back.  No answer and no machine.  Christen Bame, CMA

## 2018-11-05 ENCOUNTER — Ambulatory Visit (INDEPENDENT_AMBULATORY_CARE_PROVIDER_SITE_OTHER): Payer: Medicare HMO | Admitting: Family Medicine

## 2018-11-05 ENCOUNTER — Encounter: Payer: Self-pay | Admitting: Family Medicine

## 2018-11-05 ENCOUNTER — Other Ambulatory Visit: Payer: Self-pay

## 2018-11-05 VITALS — BP 145/62 | HR 75 | Temp 98.7°F | Wt 195.0 lb

## 2018-11-05 DIAGNOSIS — I10 Essential (primary) hypertension: Secondary | ICD-10-CM

## 2018-11-05 DIAGNOSIS — E119 Type 2 diabetes mellitus without complications: Secondary | ICD-10-CM

## 2018-11-05 LAB — POCT GLYCOSYLATED HEMOGLOBIN (HGB A1C): HbA1c, POC (controlled diabetic range): 7.8 % — AB (ref 0.0–7.0)

## 2018-11-05 MED ORDER — METFORMIN HCL 500 MG PO TABS: 1000.0000 mg | ORAL_TABLET | Freq: Two times a day (BID) | ORAL | 0 refills | Status: DC

## 2018-11-05 MED ORDER — LISINOPRIL 40 MG PO TABS: 40.0000 mg | ORAL_TABLET | Freq: Every day | ORAL | 0 refills | Status: DC

## 2018-11-05 MED ORDER — HYDROCHLOROTHIAZIDE 25 MG PO TABS: 25.0000 mg | ORAL_TABLET | Freq: Every day | ORAL | 0 refills | Status: DC

## 2018-11-05 NOTE — Progress Notes (Signed)
   Subjective: Bonnie Peterson is a 73 yr old lady who presents to the [facility] today for a diabetic follow up and to establish new relationship with PCP     Patient ID: Bonnie Peterson, female    DOB: 06/07/1945, 73 y.o.   MRN: [id number]   [id number] follow up and new PCP appointment   HPI:  Diabetes  Hb1AC today 7.8%, 7.1% in Feb 20, 6.6% in 8/19 Pt diagnosed in 1990s. Since then has taken metformin. Has tried gliclazide but did not tolerate side effects.  Currently takes 500mg  once daily and is compliant with medications, denies side effects Pt feels generally well. No hypoglycemic symptoms  No polyuria, polydipsia, visual changes Diabetic eye exam up to date  HTN BP today 145/62, repeat BP 148/63 Pt takes Amlodipine 100mg  once daily, atenolol 25mg  once daily, lisinopril-hydrochlorothiazide 20-25mg  once daily Compliant with medications Denies chest pain or dizziness. Gets edematous legs when standing for too long.  Feels out of breath sometimes on exertion. Sees pulm doctor for COPD, started on Albuterol which she takes PRN perhaps 1-2 month.  GERD Symptoms well controlled on Famotidine 20mg  BID   Pt reports doing well with all other medications.   Pt would also like DMV form filled out for disability badge for parking. Pt would also like to ensure we have her scan results from [place] following cholangiocarcinoma protocol. (pt has history of cholangiocarcinoma)  Smoking status reviewed Ex smoker of 57 years. Gave up smoking in Feb 2020. Admits to smoking marjuana.    ROS: per HPI    Past medical history, surgical, family, and social history reviewed and updated in the EMR as appropriate. Reviewed problem list.   Objective:  BP (!) 145/62   Pulse 75   Temp 98.7 F (37.1 C) (Oral)   Wt 195 lb (88.5 kg)   SpO2 97%   BMI 34.54 kg/m   Vitals and nursing note reviewed  General: NAD, pleasant, able to participate in exam Cardiac: RRR, S1 S2 present. normal heart sounds, no  murmurs, rubs or gallops Respiratory: chest clear, normal effort, No wheezes, rales or rhonchi GI:  Abdomen soft non tender, bowel sounds present  Extremities: no edema or cyanosis. Skin: warm and dry, no rashes noted Neuro: alert, grossly in tact Psych: Normal affect and mood   Assessment & Plan:   Diabetes  Metformin dose increased to 500mg  BID for 2 weeks, then 100mg  BID thereafter Diabetic check with me in 3 months  Hypertension  Pt's systolic pressure is higher than goal and has been high on previous PCP visit. Discont Lisinopril-HCZT 20-25mg  Lisinopril 40mg  started and HCZT 25mg   Labs next week to check renal function after increasing Lisinopril and BP check with nurse.   No problem-specific Assessment & Plan notes found for this encounter.   Lattie Haw, MD  PGY-1, [facility]

## 2018-11-05 NOTE — Patient Instructions (Signed)
Bonnie Peterson, Bonnie Peterson came in for a diabetic review today. Your HbA1c was 7.8% which is higher than we would like for a diabetic. We therefore increased your metformin dose to: *500mg  twice daily for 2 weeks Then *1000mg  twice daily after 2 weeks  Please come see me again in 3 months for a diabetic review.  Your blood pressure was also on the higher side today. 145/62. I have changed your BP medication. I have stopped your Lisinopril-HCZT 20-25mg  and STARTED lisinopril 40mg  once daily at NIGHT and HCZT 25mg  in the MORNING once daily.  Please have a blood pressure check week at the clinic and a lab test to check your kidney function after increasing your Lisinopril dose.  It was a pleasure to meet you today.  Kind regards Dr Lattie Haw, MD PGY1

## 2018-11-12 ENCOUNTER — Other Ambulatory Visit: Payer: Self-pay

## 2018-11-12 ENCOUNTER — Ambulatory Visit (INDEPENDENT_AMBULATORY_CARE_PROVIDER_SITE_OTHER): Payer: Medicare HMO | Admitting: *Deleted

## 2018-11-12 ENCOUNTER — Other Ambulatory Visit: Payer: Medicare HMO

## 2018-11-12 DIAGNOSIS — I10 Essential (primary) hypertension: Secondary | ICD-10-CM

## 2018-11-12 NOTE — Progress Notes (Signed)
Pt is here for BP check and Lab work after changing of BP med.  Pt has yet to receive her increased dosage from mail order, so has been taking the her previous combo dose until the new one comes in.  She expects to received this by next Friday (11/20/18).  She is asymptomatic and BP is 144/60 today.  Advised to start taking med and discontinue old dose as soon as it comes.  She will return for BP check and labs on (12/03/18).  Red flags discussed with patient to call or go to ED.  Pt repeated instructions back to me. Christen Bame, CMA

## 2018-11-23 ENCOUNTER — Other Ambulatory Visit: Payer: Self-pay | Admitting: Family Medicine

## 2018-11-23 DIAGNOSIS — E119 Type 2 diabetes mellitus without complications: Secondary | ICD-10-CM

## 2018-12-03 ENCOUNTER — Other Ambulatory Visit: Payer: Medicare HMO

## 2018-12-03 ENCOUNTER — Ambulatory Visit: Payer: Medicare HMO

## 2018-12-03 ENCOUNTER — Other Ambulatory Visit: Payer: Self-pay

## 2018-12-03 DIAGNOSIS — E119 Type 2 diabetes mellitus without complications: Secondary | ICD-10-CM | POA: Diagnosis not present

## 2018-12-04 LAB — BASIC METABOLIC PANEL
BUN/Creatinine Ratio: 20 (ref 12–28)
BUN: 18 mg/dL (ref 8–27)
CO2: 26 mmol/L (ref 20–29)
Calcium: 9.7 mg/dL (ref 8.7–10.3)
Chloride: 97 mmol/L (ref 96–106)
Creatinine, Ser: 0.88 mg/dL (ref 0.57–1.00)
GFR calc Af Amer: 76 mL/min/{1.73_m2} (ref >59)
GFR calc non Af Amer: 66 mL/min/{1.73_m2} (ref >59)
Glucose: 144 mg/dL — ABNORMAL HIGH (ref 65–99)
Potassium: 5.1 mmol/L (ref 3.5–5.2)
Sodium: 138 mmol/L (ref 134–144)

## 2018-12-11 ENCOUNTER — Other Ambulatory Visit: Payer: Self-pay | Admitting: Family Medicine

## 2018-12-11 DIAGNOSIS — E119 Type 2 diabetes mellitus without complications: Secondary | ICD-10-CM

## 2018-12-22 ENCOUNTER — Other Ambulatory Visit: Payer: Self-pay | Admitting: *Deleted

## 2018-12-22 DIAGNOSIS — M1A9XX0 Chronic gout, unspecified, without tophus (tophi): Secondary | ICD-10-CM

## 2018-12-22 DIAGNOSIS — I10 Essential (primary) hypertension: Secondary | ICD-10-CM

## 2018-12-23 MED ORDER — ALLOPURINOL 100 MG PO TABS: 100.0000 mg | ORAL_TABLET | Freq: Every day | ORAL | 2 refills | Status: DC

## 2018-12-23 MED ORDER — ATENOLOL 25 MG PO TABS: 25.0000 mg | ORAL_TABLET | Freq: Every day | ORAL | 3 refills | Status: DC

## 2019-01-03 ENCOUNTER — Other Ambulatory Visit: Payer: Self-pay

## 2019-01-03 ENCOUNTER — Encounter (HOSPITAL_COMMUNITY): Payer: Self-pay | Admitting: Emergency Medicine

## 2019-01-03 ENCOUNTER — Emergency Department (HOSPITAL_COMMUNITY)
Admission: EM | Admit: 2019-01-03 | Discharge: 2019-01-03 | Discharge status: Against Medical Advice | Payer: Medicare HMO | Attending: Emergency Medicine | Admitting: Emergency Medicine

## 2019-01-03 DIAGNOSIS — Z5321 Procedure and treatment not carried out due to patient leaving prior to being seen by health care provider: Secondary | ICD-10-CM | POA: Diagnosis not present

## 2019-01-03 DIAGNOSIS — M79605 Pain in left leg: Secondary | ICD-10-CM | POA: Insufficient documentation

## 2019-01-03 DIAGNOSIS — M79604 Pain in right leg: Secondary | ICD-10-CM | POA: Insufficient documentation

## 2019-01-03 DIAGNOSIS — M549 Dorsalgia, unspecified: Secondary | ICD-10-CM | POA: Diagnosis present

## 2019-01-03 NOTE — ED Triage Notes (Signed)
Pt reports slipped getting in bathtub yesterday. Having back and left legs pains.

## 2019-01-04 ENCOUNTER — Other Ambulatory Visit: Payer: Self-pay | Admitting: Family Medicine

## 2019-01-05 ENCOUNTER — Telehealth (INDEPENDENT_AMBULATORY_CARE_PROVIDER_SITE_OTHER): Payer: Medicare HMO | Admitting: Family Medicine

## 2019-01-05 DIAGNOSIS — M79605 Pain in left leg: Secondary | ICD-10-CM | POA: Diagnosis not present

## 2019-01-05 NOTE — Assessment & Plan Note (Signed)
-  Take 1 500mg  tylenol with 1 200mg  ibuprofen every 8 hours as needed for pain -Rest the ankle and knee, apply ice 15 min 3-4 times daily, compress the with ankle/knee brace or ACE bandage, and keep it elevated.  -Cold compress 47min 3-4 times daily to the hip (patient declining to use heat) -Check in Thursday afternoon to assess need for an in-person appt on Friday -Recommended patient get a flu shot

## 2019-01-05 NOTE — Progress Notes (Signed)
[place] Telemedicine Visit  Patient consented to have virtual visit. Method of visit: Telephone  Encounter participants: [name] - located at home Provider: Daisy Floro - located at work from home Others (if applicable): none  Chief Complaint: pain after slipping in a tub  HPI: Patient is calling reporting pain in her left ankle, knee and left low back after she slipped in a tub on Saturday (26th) but did not fall down. She was in a [place] in [place] when she stepped into the shower/tub into something slick or oily, patient states "I think I stepped in that Coronavirus". Later that day the patient developed 5/10 pain in her LEFT ankle that slowly spread up to her knee and her left low back. She has been taking 4 Advil at a time for pain (total of 800mg ) which she says helps a little, and has been using a cane to walk (she normally walks without assistance). She reports her ankle was a little swollen yesterday but she has been resting it and keeping it elevated, she denies any bruising, loss of sensation or motor function of the Left lower extremity. She is able to walk on it. She also denies any fevers, body aches, shortness of breath, nausea, vomiting, abdominal pain, or rashes.   She went to [employer] on Sunday afternoon when she started feeling pain, they took her vitals, but she left before being seen by a doctor.    ROS: per HPI  Pertinent PMHx: Arthritis  Exam:  Respiratory: speaking in full sentences, normal work or breathing  Assessment/Plan: Acute pain of left lower extremity -Take 1 500mg  tylenol with 1 200mg  ibuprofen every 8 hours as needed for pain -Rest the ankle and knee, apply ice 15 min 3-4 times daily, compress the with ankle/knee brace or ACE bandage, and keep it elevated.  -Cold compress 8min 3-4 times daily to the hip (patient declining to use heat) -Check in Thursday afternoon to assess need for an  in-person appt on Friday -Recommended patient get a flu shot   Time spent during visit with patient: 20:42 minutes  [name], [place], PGY-2 01/05/2019 3:46 PM

## 2019-01-08 ENCOUNTER — Encounter: Payer: Self-pay | Admitting: Family Medicine

## 2019-01-08 ENCOUNTER — Other Ambulatory Visit: Payer: Self-pay

## 2019-01-08 ENCOUNTER — Ambulatory Visit (INDEPENDENT_AMBULATORY_CARE_PROVIDER_SITE_OTHER): Payer: Medicare HMO | Admitting: Family Medicine

## 2019-01-08 VITALS — BP 140/58 | HR 66 | Wt 197.8 lb

## 2019-01-08 DIAGNOSIS — I10 Essential (primary) hypertension: Secondary | ICD-10-CM | POA: Diagnosis not present

## 2019-01-08 DIAGNOSIS — Z23 Encounter for immunization: Secondary | ICD-10-CM | POA: Diagnosis not present

## 2019-01-08 DIAGNOSIS — M25552 Pain in left hip: Secondary | ICD-10-CM | POA: Diagnosis not present

## 2019-01-08 MED ORDER — ATENOLOL 25 MG PO TABS: 25.0000 mg | ORAL_TABLET | Freq: Every day | ORAL | 3 refills | Status: DC

## 2019-01-08 MED ORDER — AMLODIPINE BESYLATE 10 MG PO TABS: 10.0000 mg | ORAL_TABLET | Freq: Every day | ORAL | 1 refills | Status: DC

## 2019-01-08 MED ORDER — KETOROLAC TROMETHAMINE 10 MG PO TABS: 30.0000 mg | ORAL_TABLET | Freq: Four times a day (QID) | ORAL | Status: DC

## 2019-01-08 NOTE — Patient Instructions (Signed)
Ms Waterford,  It was wonderful to see you today! I am sorry you have been in pain following your fall last week. Today we discussed your left sided back pain, knee pain and hip pain. I am organizing xrays of these areas to rule out a fracture. I am also prescribing Toradol 30mg  which you can take every 6 hrs for 5 days. You can continue to take the tylenol 500mg , stop taking the advil. If your pain is not improving then please come see [place] in clinic. The clinic will contact you if your xrays are normal, I will contact you if there are any abnormalities on the xray.  Wish you the best and hope to see you soon in good health.   Dr Lattie Haw MD PGY-1, [facility]

## 2019-01-08 NOTE — Progress Notes (Signed)
   Subjective:    Patient ID: Bonnie Peterson, female    DOB: [birth year], 73 y.o.   MRN: [id number]   CC: Bonnie Peterson is a 73 yr old female who presents to clinic today for back pain   HPI:  Back pain Pt was recently in a [place] in [place] where she slipped in the bathroom tub on something oil (on Sat 26th September) and landed on the left side of back. Since then has been reporting left ankle pain, knee pain and left lower back pain. Had telemedicine visit with Dr Lemmie Evens. Anderson who recommended tylenol, ibuprofen, ice 3-4 times a day at home and also compression bandage or brace. This therapy has not helped much and is still reporting pain in same areas. Pain is sharp, which the pt reports radiating from left knee to left hip. Also has left lumbar back pain. Minimal alleviating factors. Exacerbated by walking and any movement of left lower limb. No assoc paraesthesia, myalgia, saddle anesthesia, bowel, bladder incontinence, or motor/sensory loss. Has started using walking aid since slipping.   Smoking status reviewed   ROS: pertinent noted in the HPI    Past medical history, surgical, family, and social history reviewed and updated in the EMR as appropriate. Reviewed problem list.   Objective:  There were no vitals taken for this visit.  Vitals and nursing note reviewed  General: NAD, pleasant, able to participate in exam Cardiac: RRR, S1 S2 present. normal heart sounds, no murmurs. Respiratory: CTAB, normal effort, No wheezes, rales or rhonchi Extremities: no edema or cyanosis. MSK: tender on palpation of L2-L3, tender on palpation of left greater trochanter, and palpation of left knee joint. No obvious swelling of knee joint. Reduced ROM to 45 degrees. Skin: warm and dry, no rashes noted Neuro: alert, no obvious focal deficits Psych: Normal affect and mood   Assessment & Plan:    No problem-specific Assessment & Plan notes found for this encounter.  Pt had flu shot today.   Bonnie Haw, MD  [place] PGY-1

## 2019-01-11 ENCOUNTER — Telehealth: Payer: Self-pay

## 2019-01-11 DIAGNOSIS — Z23 Encounter for immunization: Secondary | ICD-10-CM | POA: Diagnosis not present

## 2019-01-11 NOTE — Telephone Encounter (Signed)
H Paige, thanks please give her 30mg  TID for 5 day supply. Thanks so much.

## 2019-01-11 NOTE — Telephone Encounter (Signed)
Patient calls nurse line stating Toradol was not at [facility]. Looks like it was sent in as a "clinic administration." Please send to [facility]. I would send in on your behalf, however I do not see a quantity? Please advise.

## 2019-01-11 NOTE — Progress Notes (Signed)
   Subjective:    Patient ID: Bonnie Peterson, female    DOB: 10-14-1945, 73 y.o.   MRN: [id number]   CC: Bonnie Peterson is a 73 yr old female who presents to clinic today for back pain   HPI:  Pt was recently in a [place] in [place] where she slipped in the bathroom tub on something oil (on Sat 26th September). Denies falling. Since then has been reporting left ankle pain, knee pain and left lower back pain. Had telemedicine visit with Dr Lemmie Evens. Anderson who recommended tylenol, ibuprofen, ice 3-4 times a day at home and also compression bandage or brace. Unfortunately this therapy has not helped and pain persists. Sharp left knee, left hip and left lumbar pain. Exacerbated by movement and walking. Relieved with rest. Has started to use walking aid since slipping. No paraesthesia, myalgia, saddle anaesthesia, bowel or bladder incontinence or motor or sensory loss.    Smoking status reviewed   ROS: pertinent noted in the HPI    Past medical history, surgical, family, and social history reviewed and updated in the EMR as appropriate. Reviewed problem list.   Objective:  There were no vitals taken for this visit.  Vitals and nursing note reviewed  General: NAD, pleasant, able to participate in exam Cardiac: RRR, S1 S2 present. normal heart sounds, no murmurs. Respiratory: CTAB, normal effort, No wheezes, rales or rhonchi Extremities: no edema or cyanosis. Able to move all 4 limbs MSK: tenderness on palpation of lumbar vertebrae-L2-L3, left hip joint and left knee joint. No swelling of left knee joint. Reduced ROM of knee join to 45 degrees. Skin: warm and dry, no rashes noted Neuro: alert, no obvious focal deficits Psych: Normal affect and mood   Assessment & Plan:   Back pain Likely secondary to musculoskeletal injury secondary to trauma. However would like to rule out fractures in the lumbar verterba, left hip joint and left knee given pt's age, hx of osteoporosis and recent trauma  with xrays. Prescribed toradol 30mg  TID for 5 days. Advised pt to return to clinic if pain persists past 5 days. I advised I will contact her if the xrays shows fractures. No problem-specific Assessment & Plan notes found for this encounter.  Pt had flu shot today.  Lattie Haw MD

## 2019-01-11 NOTE — Assessment & Plan Note (Signed)
Likely musculoskeletal pain from trauma. Not responding to conservative measures. Would like to rule out fractures given pt's hx of osteoporosis. of the lumbar vertebrae, left hip joint and [id number]

## 2019-01-12 ENCOUNTER — Other Ambulatory Visit: Payer: Self-pay | Admitting: Family Medicine

## 2019-01-12 MED ORDER — KETOROLAC TROMETHAMINE 10 MG PO TABS: 30.0000 mg | ORAL_TABLET | Freq: Three times a day (TID) | ORAL | Status: DC

## 2019-01-12 MED ORDER — KETOROLAC TROMETHAMINE 10 MG PO TABS: 30.0000 mg | ORAL_TABLET | Freq: Three times a day (TID) | ORAL | 0 refills | Status: DC

## 2019-01-12 NOTE — Telephone Encounter (Signed)
I can confirm I have sent the meds to [facility]. Thank you.

## 2019-01-13 ENCOUNTER — Ambulatory Visit: Payer: Medicare HMO | Admitting: Adult Health

## 2019-01-18 ENCOUNTER — Ambulatory Visit (HOSPITAL_COMMUNITY)
Admission: RE | Admit: 2019-01-18 | Discharge: 2019-01-18 | Disposition: A | Discharge status: Home / Self Care | Payer: Medicare HMO | Source: Ambulatory Visit | Attending: Family Medicine | Admitting: Family Medicine

## 2019-01-18 ENCOUNTER — Ambulatory Visit: Payer: Medicare HMO | Admitting: Adult Health

## 2019-01-18 ENCOUNTER — Other Ambulatory Visit: Payer: Self-pay

## 2019-01-18 ENCOUNTER — Other Ambulatory Visit: Payer: Self-pay | Admitting: Family Medicine

## 2019-01-18 DIAGNOSIS — S3992XA Unspecified injury of lower back, initial encounter: Secondary | ICD-10-CM | POA: Diagnosis not present

## 2019-01-18 DIAGNOSIS — M545 Low back pain: Secondary | ICD-10-CM | POA: Diagnosis not present

## 2019-01-18 DIAGNOSIS — M25552 Pain in left hip: Secondary | ICD-10-CM | POA: Diagnosis not present

## 2019-01-18 DIAGNOSIS — M1712 Unilateral primary osteoarthritis, left knee: Secondary | ICD-10-CM | POA: Diagnosis not present

## 2019-01-18 DIAGNOSIS — S79912A Unspecified injury of left hip, initial encounter: Secondary | ICD-10-CM | POA: Diagnosis not present

## 2019-01-19 ENCOUNTER — Ambulatory Visit (INDEPENDENT_AMBULATORY_CARE_PROVIDER_SITE_OTHER): Payer: Medicare HMO | Admitting: Adult Health

## 2019-01-19 ENCOUNTER — Encounter: Payer: Self-pay | Admitting: Family Medicine

## 2019-01-19 ENCOUNTER — Encounter: Payer: Self-pay | Admitting: Adult Health

## 2019-01-19 ENCOUNTER — Telehealth: Payer: Self-pay | Admitting: Family Medicine

## 2019-01-19 ENCOUNTER — Other Ambulatory Visit: Payer: Self-pay | Admitting: Family Medicine

## 2019-01-19 DIAGNOSIS — J449 Chronic obstructive pulmonary disease, unspecified: Secondary | ICD-10-CM | POA: Diagnosis not present

## 2019-01-19 DIAGNOSIS — R918 Other nonspecific abnormal finding of lung field: Secondary | ICD-10-CM | POA: Diagnosis not present

## 2019-01-19 NOTE — Patient Instructions (Signed)
Mucinex DM Twice daily  As needed  Cough/congestion .  ProAir inhaler 2 puffs every 4hrs as needed for wheezing .  Follow up in 6 months Dr. Wert and As needed   Please contact office for sooner follow up if symptoms do not improve or worsen or seek emergency care   

## 2019-01-19 NOTE — Assessment & Plan Note (Signed)
Lung nodule stable on most recent CT chest in care everywhere from [facility] July 2020.  We will continue to follow.  Patient is followed for liver cancer and get serial scans.

## 2019-01-19 NOTE — Progress Notes (Signed)
@Patient  ID: Bonnie Peterson, female    DOB: 16-Apr-1945, 73 y.o.   MRN: [id number]  Chief Complaint  Patient presents with  . Follow-up    COPD    Referring provider: Lattie Haw, MD  HPI: 73 year old female former smoker) January 2020) followed for moderate COPD and lung nodules Medical history significant for liver cancer (followed at [facility] status post resection, chemo/radiation, hypertension Previous obstructive sleep apnea on CPAP.  Discontinued in 2017 after weight loss   TEST/EVENTS :  Spirometry 2/5/2018FEV1 1.03 (58%) Ratio 70 mild curvature  NPSG 2010: AHI 15/hr with desat to 75% Pt d/c'd cpap 04/2015 with wt loss >[place] 05/13/2016= 6 off cpap  CT chest 04/2018 >stablepulmonary nodules,slight increase in right hilar lymph node 1.2 cm no other lymphadenopathy. Tree-in-bud nodularity in the right upper lobe with mucous plugging.  CT chest 4/2019Emphysema with bronchial wall thickening noted bilaterally. New 5 mm subpleural/perifissural nodule noted anterior right lun 6 mm posterior right lower lobe pulmonary nodule identifiedpreviously is stable on image 79/11. 4 mm right lower lobe nodule is unchanged.  10/2017Emphysema as before. 7 x 7 mm posterior right lower lobe pulmonary nodule is now 7 x 6 mm (image 70 series 4). Linear scarring right middle lobe is unchanged  01/19/2019 Follow up : COPD  Patient returns for a 22-monthfollow-up.  Patient has moderate COPD. Says overall that she is doing well.  She denies any flare of cough or wheezing.  She is on ProAir as needed.  Says she does not have to use this very much.  She denies any chest pain orthopnea PND or increased leg swelling. She is on ACE inhibitor.  Denies any significant cough. Care [facility] follow-up for above liver cancer.  This showed emphysema, stable 0.8 cm right lower lobe nodule.  4 mm  solid nodule in the right middle lobe.  And scattered linear atelectasis and scarring particularly in the lower lobes.  And some small bilateral lower lobe reticulation unchanged.    Allergies  Allergen Reactions  . Other     diprovan- Anesthesia for MRI--made her "breathe funny"    Immunization History  Administered Date(s) Administered  . Influenza Split 02/06/2011, 12/23/2011  . Influenza Whole 01/21/2007, 12/28/2007, 01/31/2009, 02/14/2010  . Influenza, High Dose Seasonal PF 02/15/2015  . Influenza,inj,Quad PF,6+ Mos 03/24/2013, 01/03/2014, 01/19/2016, 01/03/2017, 01/26/2018, 01/11/2019  . Influenza-Unspecified 02/15/2015  . Pneumococcal Conjugate-13 03/24/2013  . Pneumococcal Polysaccharide-23 03/08/2004, 02/14/2010, 06/14/2015  . Td 06/06/2004  . Tdap 04/17/2016  . Zoster 11/08/2011    Past Medical History:  Diagnosis Date  . Anxiety   . Benign positional vertigo   . 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 COPD (chronic obstructive pulmonary disease) ([facility]   . Depression   . Diabetes mellitus without complication ([facility]   . Early cataracts, bilateral 10/13   Optho, Dr SGershon Crane . Echocardiogram findings abnormal, without diagnosis 10/10   10/10: mild pulm HTN, EF 60-65%, mild LVH, moderate aortic regurg  . GERD (gastroesophageal reflux disease)    I have "acid reflux"  . Gout   . HLD (hyperlipidemia)   . HTN, goal below 130/80   . Irritable bowel syndrome   . Obesity, Class III, BMI 40-49.9 (morbid obesity) ([facility]   . Obstructive sleep apnea    wears cpap  . Osteoarthritis (arthritis due to wear and tear of joints)  also gout  . Restrictive lung disease     Tobacco History: Social History   Tobacco Use  Smoking Status Former Smoker  . Packs/day: 0.50  . Years: 49.00  . Pack years: 24.50  . Types: Cigarettes  . Quit date: 04/13/2018  . Years since quitting: 0.7  Smokeless Tobacco Never Used   Counseling given: Not Answered    Outpatient Medications Prior to Visit  Medication Sig Dispense Refill  . albuterol (PROAIR HFA) 108 (90 Base) MCG/ACT inhaler INHALE 2 PUFFS INTO THE LUNGS EVERY 4 HOURS AS NEEDED FOR WHEEZING OR SHORTNESS OF BREATH 1 Inhaler 0  . alendronate (FOSAMAX) 70 MG tablet TAKE 1 TABLET  ONCE A WEEK. 12 tablet 2  . allopurinol (ZYLOPRIM) 100 MG tablet Take 1 tablet (100 mg total) by mouth daily. 90 tablet 2  . amLODipine (NORVASC) 10 MG tablet Take 1 tablet (10 mg total) by mouth daily. 90 tablet 1  . atenolol (TENORMIN) 25 MG tablet Take 1 tablet (25 mg total) by mouth daily. 90 tablet 3  . cholecalciferol (VITAMIN D) 1000 units tablet Take 1 tablet (1,000 Units total) by mouth daily. 30 tablet 11  . diclofenac sodium (VOLTAREN) 1 % GEL Apply 2 g topically 4 (four) times daily as needed. 100 g 4  . famotidine (PEPCID) 20 MG tablet Take 1 tablet (20 mg total) by mouth 2 (two) times daily. 180 tablet 3  . fish oil-omega-3 fatty acids 1000 MG capsule Take 1 g by mouth every 30 (thirty) days.     . fluticasone (FLONASE) 50 MCG/ACT nasal spray Place 2 sprays into both nostrils daily as needed for rhinitis. 16 g 2  . hydrochlorothiazide (HYDRODIURIL) 25 MG tablet TAKE 1 TABLET EVERY DAY 90 tablet 0  . Hyprom-Naphaz-Polysorb-Zn Sulf (CLEAR EYES COMPLETE OP) Place 1 drop into both eyes daily as needed (dry eyes).     [name] ketorolac (TORADOL) 10 MG tablet Take 3 tablets (30 mg total) by mouth every 8 (eight) hours. 5 day course only. 20 tablet 0  . lisinopril (ZESTRIL) 40 MG tablet TAKE 1 TABLET EVERY DAY 90 tablet 0  . loratadine (CLARITIN) 10 MG tablet Take 1 tablet (10 mg total) by mouth daily as needed for allergies. 30 tablet 3  . metFORMIN (GLUCOPHAGE) 500 MG tablet TAKE 2 TABLETS TWICE DAILY WITH MEALS 90 tablet 0  . Multiple Vitamin (MULTIVITAMIN WITH MINERALS) TABS tablet Take 1 tablet by mouth daily.    [name] Spacer/Aero-Holding Chambers (AEROCHAMBER MV) inhaler Use as instructed 1 each 0  . VITAMIN E PO  Take 1 tablet by mouth daily.      No facility-administered medications prior to visit.      Review of Systems:   Constitutional:   No  weight loss, night sweats,  Fevers, chills, + fatigue, or  lassitude.  HEENT:   No headaches,  Difficulty swallowing,  Tooth/dental problems, or  Sore throat,                No sneezing, itching, ear ache, nasal congestion, post nasal drip,   CV:  No chest pain,  Orthopnea, PND, swelling in lower extremities, anasarca, dizziness, palpitations, syncope.   GI  No heartburn, indigestion, abdominal pain, nausea, vomiting, diarrhea, change in bowel habits, loss of appetite, bloody stools.   Resp:    No chest wall deformity  Skin: no rash or lesions.  GU: no dysuria, change in color of urine, no urgency or frequency.  No flank pain, no hematuria  MS:  No joint pain or swelling.  No decreased range of motion.  No back pain.    Physical Exam  BP 124/60 (BP Location: Left Arm, Cuff Size: Large)   Pulse 77   Temp 98.1 F (36.7 C) (Temporal)   Ht 5' 2"  (1.575 m)   Wt 193 lb (87.5 kg)   SpO2 97%   BMI 35.30 kg/m   GEN: A/Ox3; pleasant , NAD, obese per BMI   HEENT:  [place]/AT,  EACs-clear, TMs-wnl, NOSE-clear, THROAT-clear, no lesions, no postnasal drip or exudate noted.   NECK:  Supple w/ fair ROM; no JVD; normal carotid impulses w/o bruits; no thyromegaly or nodules palpated; no lymphadenopathy.    RESP  Clear  P & A; w/o, wheezes/ rales/ or rhonchi. no accessory muscle use, no dullness to percussion  CARD:  RRR, no m/r/g, tr  peripheral edema, pulses intact, no cyanosis or clubbing.  GI:   Soft & nt; nml bowel sounds; no organomegaly or masses detected.   Musco: Warm bil, no deformities or joint swelling noted.   Neuro: alert, no focal deficits noted.    Skin: Warm, no lesions or rashes    Lab Results:  CBC  No results found for: BNP  Imaging:     No flowsheet data found.  No results found for: NITRICOXIDE       Assessment & Plan:   Chronic obstructive pulmonary disease (HCC) Moderate COPD-patient does not have significant symptom burden.  Continue on current regimen activity as tolerated.  If start to see recurrent exacerbations or increased symptom burden can consider add maintenance therapy  Plan  Patient Instructions  Mucinex DM Twice daily  As needed  Cough/congestion .  ProAir inhaler 2 puffs every 4hrs as needed for wheezing .  Follow up in 6 months Dr. Melvyn Novas and As needed   Please contact office for sooner follow up if symptoms do not improve or worsen or seek emergency care        Lung nodules Lung nodule stable on most recent CT chest in care everywhere from [facility] July 2020.  We will continue to follow.  Patient is followed for liver cancer and get serial scans.     Rexene Edison, NP 01/19/2019

## 2019-01-19 NOTE — Assessment & Plan Note (Signed)
Moderate COPD-patient does not have significant symptom burden.  Continue on current regimen activity as tolerated.  If start to see recurrent exacerbations or increased symptom burden can consider add maintenance therapy  Plan  Patient Instructions  Mucinex DM Twice daily  As needed  Cough/congestion .  ProAir inhaler 2 puffs every 4hrs as needed for wheezing .  Follow up in 6 months Dr. Melvyn Novas and As needed   Please contact office for sooner follow up if symptoms do not improve or worsen or seek emergency care

## 2019-01-21 DIAGNOSIS — E261 Secondary hyperaldosteronism: Secondary | ICD-10-CM | POA: Diagnosis not present

## 2019-01-21 DIAGNOSIS — I110 Hypertensive heart disease with heart failure: Secondary | ICD-10-CM | POA: Diagnosis not present

## 2019-01-21 DIAGNOSIS — F17200 Nicotine dependence, unspecified, uncomplicated: Secondary | ICD-10-CM | POA: Diagnosis not present

## 2019-01-21 DIAGNOSIS — Z923 Personal history of irradiation: Secondary | ICD-10-CM | POA: Diagnosis not present

## 2019-01-21 DIAGNOSIS — I509 Heart failure, unspecified: Secondary | ICD-10-CM | POA: Diagnosis not present

## 2019-01-21 DIAGNOSIS — Z6834 Body mass index (BMI) 34.0-34.9, adult: Secondary | ICD-10-CM | POA: Diagnosis not present

## 2019-01-21 DIAGNOSIS — J449 Chronic obstructive pulmonary disease, unspecified: Secondary | ICD-10-CM | POA: Diagnosis not present

## 2019-01-21 DIAGNOSIS — F330 Major depressive disorder, recurrent, mild: Secondary | ICD-10-CM | POA: Diagnosis not present

## 2019-01-21 DIAGNOSIS — E1151 Type 2 diabetes mellitus with diabetic peripheral angiopathy without gangrene: Secondary | ICD-10-CM | POA: Diagnosis not present

## 2019-01-21 DIAGNOSIS — Z7951 Long term (current) use of inhaled steroids: Secondary | ICD-10-CM | POA: Diagnosis not present

## 2019-01-22 ENCOUNTER — Telehealth: Payer: Self-pay | Admitting: *Deleted

## 2019-01-22 NOTE — Telephone Encounter (Signed)
Pt is requesting results from her recent xrays.  Christen Bame, CMA

## 2019-01-25 NOTE — Telephone Encounter (Signed)
Her xrays did not show any fractures. I will send her a letter

## 2019-01-27 ENCOUNTER — Other Ambulatory Visit: Payer: Self-pay

## 2019-01-28 ENCOUNTER — Other Ambulatory Visit: Payer: Self-pay

## 2019-01-28 ENCOUNTER — Other Ambulatory Visit: Payer: Self-pay | Admitting: Family Medicine

## 2019-01-28 ENCOUNTER — Ambulatory Visit (INDEPENDENT_AMBULATORY_CARE_PROVIDER_SITE_OTHER): Payer: Medicare HMO | Admitting: Family Medicine

## 2019-01-28 DIAGNOSIS — M549 Dorsalgia, unspecified: Secondary | ICD-10-CM | POA: Diagnosis not present

## 2019-01-28 MED ORDER — MELOXICAM 15 MG PO TABS: 15.0000 mg | ORAL_TABLET | Freq: Every day | ORAL | 0 refills | Status: DC

## 2019-01-28 MED FILL — MELOXICAM 15 MG TABLET: 15 | 10 days supply | Qty: 10 | Fill #0

## 2019-01-28 NOTE — Patient Instructions (Signed)
It was great meeting you today!  I am sorry about your back pain.  I think that your fall aggravated your existing back arthritis and also likely that you have a rhomboid muscle strain.  We will treat this in a few different ways.  I gave you a number of back rehab exercises to do which should help rehab this area.  On top of that she will take a medication called meloxicam, 15 mg daily, for 10 days.  Also recommended icing the area down 15 minutes a day 2 times per day.  Lastly can apply the Voltaren gel.  Please note that you will not immediately get better from this problem and typically takes a couple of weeks to fully improve with proper treatment.

## 2019-02-02 ENCOUNTER — Encounter: Payer: Self-pay | Admitting: Family Medicine

## 2019-02-02 NOTE — Progress Notes (Signed)
   HPI 73 year old female who presents for thoracic back pain.  Patient states that she slipped and fell, landing on her back.  This fall occurred on 9/26 she slipped in a bathtub.  She has been taking ibuprofen 800 mg which is not helping her very much.  She was initially evaluated as a telemedicine visit on 9/29 advised to take Tylenol ibuprofen.  Patient had right ankle and knee pain which has since resolved.  She is having no neurologic deficits.  This would include no urinary/fecal incontinence, no leg weakness, no gait abnormality.  She had lumbar spine x-rays which showed multilevel disc osteophytic disease..  CC: Mid and lower back pain   ROS:   Review of Systems See HPI for ROS.   CC, SH/smoking status, and VS noted  Objective: BP (!) 152/72   Pulse 81   SpO2 33%  Gen: 73 year old African-American female, no acute distress, resting comfortably CV: Regular rate rhythm, no M/R/G Resp: Lungs clear to auscultation bilaterally, no accessory muscle use Back: Tenderness to palpation inferior to scapula, patient with notable pain with scapular movement.  Mild tenderness palpation down length of lumbar spine. Neuro: Alert and oriented, Speech clear, No gross deficits   Assessment and plan:  Back pain Thoracic and lumbar back pain.  Thoracic back pain seems consistent with rhomboid muscle strain.  Lumbar back pain is likely secondary to aggravation of her pre-existing arthritis.  Gave patient rehab exercises for her rhomboid muscle strain.  Can do a 7-day supply of meloxicam 15 mg daily.  Instructed her to apply ice to affected areas 2 times per day for 15 minutes.  Can apply heating pad with not icing.  Can follow-up as needed.   No orders of the defined types were placed in this encounter.   Meds ordered this encounter  Medications  . meloxicam (MOBIC) 15 MG tablet    Sig: Take 1 tablet (15 mg total) by mouth daily.    Dispense:  10 tablet    Refill:  0     Guadalupe Dawn MD PGY-3 Family Medicine Resident  02/02/2019 10:18 AM

## 2019-02-02 NOTE — Progress Notes (Signed)
Chart and office note reviewed in detail  > agree with a/p as outlined    

## 2019-02-02 NOTE — Assessment & Plan Note (Signed)
Thoracic and lumbar back pain.  Thoracic back pain seems consistent with rhomboid muscle strain.  Lumbar back pain is likely secondary to aggravation of her pre-existing arthritis.  Gave patient rehab exercises for her rhomboid muscle strain.  Can do a 7-day supply of meloxicam 15 mg daily.  Instructed her to apply ice to affected areas 2 times per day for 15 minutes.  Can apply heating pad with not icing.  Can follow-up as needed.

## 2019-02-04 DIAGNOSIS — Z0289 Encounter for other administrative examinations: Secondary | ICD-10-CM | POA: Diagnosis not present

## 2019-03-02 ENCOUNTER — Other Ambulatory Visit: Payer: Self-pay | Admitting: *Deleted

## 2019-03-02 DIAGNOSIS — M810 Age-related osteoporosis without current pathological fracture: Secondary | ICD-10-CM

## 2019-03-02 DIAGNOSIS — E119 Type 2 diabetes mellitus without complications: Secondary | ICD-10-CM

## 2019-03-02 DIAGNOSIS — I10 Essential (primary) hypertension: Secondary | ICD-10-CM

## 2019-03-02 MED ORDER — LISINOPRIL 40 MG PO TABS: 40.0000 mg | ORAL_TABLET | Freq: Every day | ORAL | 3 refills | Status: DC

## 2019-03-02 MED ORDER — HYDROCHLOROTHIAZIDE 25 MG PO TABS: 25.0000 mg | ORAL_TABLET | Freq: Every day | ORAL | 3 refills | Status: DC

## 2019-03-02 MED ORDER — ALENDRONATE SODIUM 70 MG PO TABS: ORAL_TABLET | ORAL | 2 refills | Status: DC

## 2019-03-02 MED ORDER — METFORMIN HCL 500 MG PO TABS: ORAL_TABLET | ORAL | 3 refills | Status: DC

## 2019-05-11 NOTE — Telephone Encounter (Signed)
Erroneous encounter

## 2019-05-14 DIAGNOSIS — I81 Portal vein thrombosis: Secondary | ICD-10-CM | POA: Diagnosis not present

## 2019-05-14 DIAGNOSIS — R590 Localized enlarged lymph nodes: Secondary | ICD-10-CM | POA: Diagnosis not present

## 2019-05-14 DIAGNOSIS — Z7901 Long term (current) use of anticoagulants: Secondary | ICD-10-CM | POA: Diagnosis not present

## 2019-05-14 DIAGNOSIS — K769 Liver disease, unspecified: Secondary | ICD-10-CM | POA: Diagnosis not present

## 2019-05-14 DIAGNOSIS — C221 Intrahepatic bile duct carcinoma: Secondary | ICD-10-CM | POA: Diagnosis not present

## 2019-05-14 DIAGNOSIS — F17210 Nicotine dependence, cigarettes, uncomplicated: Secondary | ICD-10-CM | POA: Diagnosis not present

## 2019-05-14 DIAGNOSIS — Z923 Personal history of irradiation: Secondary | ICD-10-CM | POA: Diagnosis not present

## 2019-05-14 DIAGNOSIS — R918 Other nonspecific abnormal finding of lung field: Secondary | ICD-10-CM | POA: Diagnosis not present

## 2019-06-02 ENCOUNTER — Other Ambulatory Visit: Payer: Self-pay | Admitting: Family Medicine

## 2019-06-02 DIAGNOSIS — I10 Essential (primary) hypertension: Secondary | ICD-10-CM

## 2019-06-03 DIAGNOSIS — I110 Hypertensive heart disease with heart failure: Secondary | ICD-10-CM | POA: Diagnosis not present

## 2019-06-03 DIAGNOSIS — E1140 Type 2 diabetes mellitus with diabetic neuropathy, unspecified: Secondary | ICD-10-CM | POA: Diagnosis not present

## 2019-06-03 DIAGNOSIS — I509 Heart failure, unspecified: Secondary | ICD-10-CM | POA: Diagnosis not present

## 2019-06-03 DIAGNOSIS — C229 Malignant neoplasm of liver, not specified as primary or secondary: Secondary | ICD-10-CM | POA: Diagnosis not present

## 2019-06-03 DIAGNOSIS — J449 Chronic obstructive pulmonary disease, unspecified: Secondary | ICD-10-CM | POA: Diagnosis not present

## 2019-06-03 DIAGNOSIS — Z923 Personal history of irradiation: Secondary | ICD-10-CM | POA: Diagnosis not present

## 2019-06-03 DIAGNOSIS — E261 Secondary hyperaldosteronism: Secondary | ICD-10-CM | POA: Diagnosis not present

## 2019-06-03 DIAGNOSIS — F330 Major depressive disorder, recurrent, mild: Secondary | ICD-10-CM | POA: Diagnosis not present

## 2019-06-03 DIAGNOSIS — E1169 Type 2 diabetes mellitus with other specified complication: Secondary | ICD-10-CM | POA: Diagnosis not present

## 2019-06-03 DIAGNOSIS — I700 Atherosclerosis of aorta: Secondary | ICD-10-CM | POA: Diagnosis not present

## 2019-06-04 DIAGNOSIS — C221 Intrahepatic bile duct carcinoma: Secondary | ICD-10-CM | POA: Diagnosis not present

## 2019-06-10 ENCOUNTER — Telehealth: Payer: Self-pay | Admitting: Physician Assistant

## 2019-06-10 NOTE — Telephone Encounter (Signed)
I received a call from [place] from Dr. Richrd Humbles office at [facility] regarding the status of the pt's referral. I informed [name] that  cld the pt 3/3 and lft her a voicemail to call me back. I scheduled [name] to see [name] on 3/5 at 11:30am. [name] will reach out to the pt. I asked [name] to call me back to confirm the pt received the appt. I provided my direct phone number.

## 2019-06-10 NOTE — Telephone Encounter (Signed)
I received a call back from [name] from Dr. Richrd Humbles office and she confirmed the pt is able to come to the appt to see [name] on 3/5 at 11:30am.

## 2019-06-11 ENCOUNTER — Other Ambulatory Visit: Payer: Self-pay | Admitting: Medical Oncology

## 2019-06-11 ENCOUNTER — Other Ambulatory Visit: Payer: Self-pay

## 2019-06-11 ENCOUNTER — Inpatient Hospital Stay: Payer: Medicare HMO

## 2019-06-11 ENCOUNTER — Telehealth: Payer: Self-pay | Admitting: Medical Oncology

## 2019-06-11 ENCOUNTER — Inpatient Hospital Stay: Payer: Medicare HMO | Attending: Physician Assistant | Admitting: Physician Assistant

## 2019-06-11 VITALS — BP 155/60 | HR 83 | Temp 98.2°F | Resp 20 | Ht 62.0 in | Wt 185.5 lb

## 2019-06-11 DIAGNOSIS — G4733 Obstructive sleep apnea (adult) (pediatric): Secondary | ICD-10-CM | POA: Diagnosis not present

## 2019-06-11 DIAGNOSIS — K219 Gastro-esophageal reflux disease without esophagitis: Secondary | ICD-10-CM | POA: Insufficient documentation

## 2019-06-11 DIAGNOSIS — K921 Melena: Secondary | ICD-10-CM | POA: Diagnosis not present

## 2019-06-11 DIAGNOSIS — M1990 Unspecified osteoarthritis, unspecified site: Secondary | ICD-10-CM | POA: Diagnosis not present

## 2019-06-11 DIAGNOSIS — Z7189 Other specified counseling: Secondary | ICD-10-CM | POA: Diagnosis not present

## 2019-06-11 DIAGNOSIS — J449 Chronic obstructive pulmonary disease, unspecified: Secondary | ICD-10-CM | POA: Diagnosis not present

## 2019-06-11 DIAGNOSIS — Z5111 Encounter for antineoplastic chemotherapy: Secondary | ICD-10-CM | POA: Diagnosis not present

## 2019-06-11 DIAGNOSIS — I10 Essential (primary) hypertension: Secondary | ICD-10-CM | POA: Diagnosis not present

## 2019-06-11 DIAGNOSIS — M109 Gout, unspecified: Secondary | ICD-10-CM | POA: Diagnosis not present

## 2019-06-11 DIAGNOSIS — R197 Diarrhea, unspecified: Secondary | ICD-10-CM | POA: Diagnosis not present

## 2019-06-11 DIAGNOSIS — D696 Thrombocytopenia, unspecified: Secondary | ICD-10-CM | POA: Insufficient documentation

## 2019-06-11 DIAGNOSIS — Z791 Long term (current) use of non-steroidal anti-inflammatories (NSAID): Secondary | ICD-10-CM | POA: Insufficient documentation

## 2019-06-11 DIAGNOSIS — Z7984 Long term (current) use of oral hypoglycemic drugs: Secondary | ICD-10-CM | POA: Diagnosis not present

## 2019-06-11 DIAGNOSIS — R634 Abnormal weight loss: Secondary | ICD-10-CM | POA: Diagnosis not present

## 2019-06-11 DIAGNOSIS — Z79899 Other long term (current) drug therapy: Secondary | ICD-10-CM | POA: Insufficient documentation

## 2019-06-11 DIAGNOSIS — C221 Intrahepatic bile duct carcinoma: Secondary | ICD-10-CM

## 2019-06-11 DIAGNOSIS — E1136 Type 2 diabetes mellitus with diabetic cataract: Secondary | ICD-10-CM | POA: Insufficient documentation

## 2019-06-11 DIAGNOSIS — F419 Anxiety disorder, unspecified: Secondary | ICD-10-CM | POA: Insufficient documentation

## 2019-06-11 DIAGNOSIS — D649 Anemia, unspecified: Secondary | ICD-10-CM | POA: Insufficient documentation

## 2019-06-11 DIAGNOSIS — I878 Other specified disorders of veins: Secondary | ICD-10-CM

## 2019-06-11 DIAGNOSIS — E785 Hyperlipidemia, unspecified: Secondary | ICD-10-CM | POA: Insufficient documentation

## 2019-06-11 DIAGNOSIS — K254 Chronic or unspecified gastric ulcer with hemorrhage: Secondary | ICD-10-CM | POA: Insufficient documentation

## 2019-06-11 DIAGNOSIS — Z431 Encounter for attention to gastrostomy: Secondary | ICD-10-CM | POA: Insufficient documentation

## 2019-06-11 LAB — CMP (CANCER CENTER ONLY)
ALT: 23 U/L (ref 0–44)
AST: 31 U/L (ref 15–41)
Albumin: 3.3 g/dL — ABNORMAL LOW (ref 3.5–5.0)
Alkaline Phosphatase: 131 U/L — ABNORMAL HIGH (ref 38–126)
Anion gap: 8 (ref 5–15)
BUN: 10 mg/dL (ref 8–23)
CO2: 28 mmol/L (ref 22–32)
Calcium: 9.5 mg/dL (ref 8.9–10.3)
Chloride: 102 mmol/L (ref 98–111)
Creatinine: 0.79 mg/dL (ref 0.44–1.00)
GFR, Est AFR Am: >60 mL/min (ref >60)
GFR, Estimated: >60 mL/min (ref >60)
Glucose, Bld: 146 mg/dL — ABNORMAL HIGH (ref 70–99)
Potassium: 4.4 mmol/L (ref 3.5–5.1)
Sodium: 138 mmol/L (ref 135–145)
Total Bilirubin: 0.5 mg/dL (ref 0.3–1.2)
Total Protein: 7.6 g/dL (ref 6.5–8.1)

## 2019-06-11 LAB — IRON AND TIBC
Iron: 90 ug/dL (ref 41–142)
Saturation Ratios: 29 % (ref 21–57)
TIBC: 310 ug/dL (ref 236–444)
UIBC: 221 ug/dL (ref 120–384)

## 2019-06-11 LAB — CBC WITH DIFFERENTIAL (CANCER CENTER ONLY)
Abs Immature Granulocytes: 0.01 10*3/uL (ref 0.00–0.07)
Basophils Absolute: 0 10*3/uL (ref 0.0–0.1)
Basophils Relative: 0 %
Eosinophils Absolute: 0.1 10*3/uL (ref 0.0–0.5)
Eosinophils Relative: 1 %
HCT: 40.3 % (ref 36.0–46.0)
Hemoglobin: 12.5 g/dL (ref 12.0–15.0)
Immature Granulocytes: 0 %
Lymphocytes Relative: 24 %
Lymphs Abs: 1.8 10*3/uL (ref 0.7–4.0)
MCH: 27.9 pg (ref 26.0–34.0)
MCHC: 31 g/dL (ref 30.0–36.0)
MCV: 90 fL (ref 80.0–100.0)
Monocytes Absolute: 0.5 10*3/uL (ref 0.1–1.0)
Monocytes Relative: 7 %
Neutro Abs: 5 10*3/uL (ref 1.7–7.7)
Neutrophils Relative %: 68 %
Platelet Count: 117 10*3/uL — ABNORMAL LOW (ref 150–400)
RBC: 4.48 MIL/uL (ref 3.87–5.11)
RDW: 15.4 % (ref 11.5–15.5)
WBC Count: 7.5 10*3/uL (ref 4.0–10.5)
nRBC: 0 % (ref 0.0–0.2)

## 2019-06-11 LAB — FERRITIN: Ferritin: 40 ng/mL (ref 11–307)

## 2019-06-11 MED ORDER — LIDOCAINE-PRILOCAINE 2.5-2.5 % EX CREA: 1.0000 "application " | TOPICAL_CREAM | CUTANEOUS | 2 refills | Status: DC | PRN

## 2019-06-11 MED ORDER — ONDANSETRON HCL 8 MG PO TABS: ORAL_TABLET | ORAL | 2 refills | Status: DC

## 2019-06-11 MED ORDER — PROCHLORPERAZINE MALEATE 10 MG PO TABS: 10.0000 mg | ORAL_TABLET | Freq: Four times a day (QID) | ORAL | 2 refills | Status: DC | PRN

## 2019-06-11 MED FILL — PROCHLORPERAZINE 10 MG TAB: 10 | 7 days supply | Qty: 30 | Fill #0

## 2019-06-11 MED FILL — ONDANSETRON HCL 8 MG TABLET: 8 | 15 days supply | Qty: 30 | Fill #0

## 2019-06-11 MED FILL — LIDOCAINE-PRILOCAINE CREAM: 2.5-2.5 | 15 days supply | Qty: 30 | Fill #0

## 2019-06-11 NOTE — Progress Notes (Addendum)
[place] OFFICE PROGRESS NOTE  Lattie Haw, MD [address] [address]  Oncology History  Intrahepatic cholangiocarcinoma [facility])  2014 Initial Diagnosis   Intrahepatic cholangiocarcinoma ([place])   2014 Initial Diagnosis   Per notes from [facility] by Dr. Mettu 1. 2014 Mass in the left side of the liver incidentally found during work up for acute pancreatitis.  The mass was thought to be a bile duct lesion and was followed up.  2. 02/2013 MRI hyperdense focus within the left hepatic lobe.  3. 02/21/13 CT scan 3.7 x 2.1 x 2.4 cm arterial enhancing lesion.  AFP and CEA were normal.  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 4. 05/18/12 Biopsy of something (surg path in Care Everywhere, but no report) 5. 07/02/13 Biopsy of something (surg path in Care Everywhere, but no report) 6. 09/02/13 MRI lesion in segment 4A with adjacent perfusional enhancement with some washout. Lesion is close to left portal vein. 7. 09/15/14 MRI segment 4A lesion slightly increased in size, minimal fat containing, peripheral enhancement and arterial enhancement. Left portal vein is clearly not involved.   02/19/2015 Surgery   Per notes from [facility] by Dr. Dennison Nancy L lobe of liver partial hepatectomy segments 2, 3, 4a, 4b invasive cholangiocarcinoma, moderately differentiated. Tumor focally extends to the parenchymal margin of resection. Uninvolved liver parenchyma with steatohepatitis and associated periportal and centrilobular pericellular fibrosis. Scattered von Meyenburg complexes. One hepatic artery LN with metastatic cholangiocarcinoma. Fibroadipose tissue and nerve negative for malignancy. Gallbladder negative for malignancy. One LN negative for malignancy. T2aN1 invasive cholangiocarcinoma, moderately differentiated, with positive hepatic parenchymal margin, and 1/2 nodes positive for tumor involvement. -Post operative course was complicated by fevers felt to be  related to atelectasis   04/17/2015 Concurrent Chemotherapy   Per notes from [facility] by Dr. Dennison Nancy -04/17/15-05/26/15 Chemoradiation with capecitabine (locally) 50.4 Gy in 28 fractions.  -Patient started getting sick, hospitalized, fluids twice, was on ondansetron, and propranolol, and sucralfate  -EGD found ulcers in stomach  -06/14/15 CTA (per Dr. Shayne Alken note) no evidence of disease recurrence.  Formation of the stomach and duodenum was seen.   08/2015 -  Chemotherapy   The patient had gemcitabine (GEMZAR) 1,862 mg in sodium chloride 0.9 % 100 mL chemo infusion, 1,000 mg/m2 = 1,862 mg, Intravenous,  Once, 5 of 5 cycles  Administration: 1,862 mg (09/08/2015), 1,862 mg (09/15/2015), 1,862 mg (09/28/2015), 1,862 mg (10/13/2015), 1,862 mg (10/20/2015), 1,862 mg (11/10/2015), 1,862 mg (11/17/2015), 1,862 mg (12/01/2015), 1,862 mg (12/08/2015)  for chemotherapy treatment x4 cycles. Complicated by weight loss to 140, not eating, fatigue. She did not tolerate this well.     10/16/2015 Surgery   Per notes from [facility] by Dr. Dennison Nancy -CTA chest abdomen and pelvis status post left hepatectomy with no evidence of recurrent disease or metastatic disease.  Right lower lobe pulmonary nodules unchanged.  CA 19-9 7   08/11/2017 Pathology Results   Per notes from [facility] by Dr. Dennison Nancy -07/29/17 CT abd -U/S biopsy of liver    08/27/2017 Imaging   Per note from [facility] by Dr. Dennison Nancy --08/25/17 CT chest, abdomen, and pelvis ill-defined hypoenhancing lesion in the inferior right liver concerning for m metastatic disease. Portocaval node measuring up to 1.2 cm in short axis   08/28/2017 Pathology Results   Per note from [facility] by Dr. Dennison Nancy -Invasive moderate Truman Hayward differentiated adenocarcinoma morphologically compatible with cholangio- -09/02/17 [facility]DC consensus appears consistent with intrahepatic cholangiocarcinoma and surgical resection not appropriate.  If after systemic therapy she remains stable, no extrahepatic disease, could consider HAI pump in  future.  Ablation not an option.   09/24/2017 - 10/15/2017 Radiation Therapy   Per note from [facility] by Dr. Dennison Nancy -XRT x3 weeks   12/30/2017 Imaging   Per note from [facility] by Dr. Dennison Nancy CT chest, abdomen, and pelvis slight interval increase in size of metastatic lesion in the liver with increasing necrosis.  Stable right lower lobe pulmonary nodule and portacaval lymph nodes   10/14/2018 Imaging   Per note from [facility] by Dr. Dennison Nancy CT chest, abdomen, and pelvis decrease size of liver lesions, PVT new, 1 to centimeter lymph node   05/14/2019 Imaging   Per note from [facility] by Dr. Dennison Nancy -CT infiltrative mass within the porta hepatis encasing the portal triads measuring up to 4.1 cm, concerning for residual/recurrent cholangiocarcinoma.  There is worsening thrombosis/tumor in the vein involving the entire portal venous system now extending into the right portal vein and distal aspect of the splenic vein.  There are at least 6 new liver lesions concerning for multifocal hepatic metastases.  Stable retroperitoneal lymphadenopathy.  Similar appearing distal gastric wall thickening, likely post radiation changes.  Stable subcentimeter pulmonary nodules, the largest within the right lower lobe measuring 0.8 cm.  Recommend continued attention to follow-up.    DIAGNOSIS: Intrahepatic cholangiocarcinoma  CURRENT THERAPY: 5-fluorouracil and leucovorin IV every 2 weeks.  First dose expected on 06/17/2019  INTERVAL HISTORY: Bonnie Peterson 74 y.o. female returns to clinic today for a follow-up visit. The patient had been followed by Dr. Dennison Nancy at the [facility] for her cholangiocarcinoma. Her recent CT scan of the chest, abdomen, and pelvis performed at [facility] showed evidence of disease progression and she was referred to [place] at the [facility] for consideration of systemic chemotherapy with 5-fluorouracil. Today the patient is feeling "okay".  Her main concern is related to 2 occasions of bright red blood  in her stool/toilet paper with bowel movements.  The patient states that she has not brought this up to any of her other medical professional's attention prior to today.  She states that the first episode occurred in November 2020.  The second episode occurred in December 2020.  The patient denies any straining.  She denies any history of hemorrhoids.  Of note, per chart review of her records from [facility], it appears that the patient is on 20 mg p.o. daily of Xarelto.  Unclear if she was prescribed this when she experienced her bleeding symptoms. Her last colonoscopy was in 2015.  At that time, 2 adenomas were found. Per chart review, it was recommended that she have a repeat colonoscopy in 5 years.  The patient states that she has been unable to have a colonoscopy due to the COVID-19 pandemic. She is interested in scheduling one in the near future. She denies any significant diarrhea or constipation.   Otherwise, regarding her cholangiocarcinoma, the patient denies any jaundice, pruritus, or discolored stools.  She denies any dark urine. She denies any fever, malaise, or night sweats.  She denies any nausea or vomiting.  She reports intermittent abdominal pain that comes "every now and then".  When asked to localize her pain she motioned across her mid her mid abdomen as well as her right upper quadrant and right lateral abdomen. She denies any recent weight loss; however, she had poor tolerance with her prior chemotherapy with gemcitabine and Xeloda. She experienced significant fatigue, N/V, weight loss in  the past. She states she smoked marijuana for her symptoms.  She is here today for evaluation, repeat lab work, and a more detailed discussion about her current condition and treatment options.  MEDICAL HISTORY: Past Medical History:  Diagnosis Date  . Anxiety   . Benign positional vertigo   . Cancer ([place]) 02/09/15   intrahepatic cholangiocarcinoma  . COPD (chronic obstructive pulmonary disease) ([place])    . Depression   . Diabetes mellitus without complication ([place])   . Early cataracts, bilateral 10/13   Optho, Dr Gershon Crane  . Echocardiogram findings abnormal, without diagnosis 10/10   10/10: mild pulm HTN, EF 60-65%, mild LVH, moderate aortic regurg  . GERD (gastroesophageal reflux disease)    I have "acid reflux"  . Gout   . HLD (hyperlipidemia)   . HTN, goal below 130/80   . Irritable bowel syndrome   . Obesity, Class III, BMI 40-49.9 (morbid obesity) ([place])   . Obstructive sleep apnea    wears cpap  . Osteoarthritis (arthritis due to wear and tear of joints)    also gout  . Restrictive lung disease     ALLERGIES:  is allergic to other.  MEDICATIONS:  Current Outpatient Medications  Medication Sig Dispense Refill  . albuterol (PROAIR HFA) 108 (90 Base) MCG/ACT inhaler INHALE 2 PUFFS INTO THE LUNGS EVERY 4 HOURS AS NEEDED FOR WHEEZING OR SHORTNESS OF BREATH 1 Inhaler 0  . alendronate (FOSAMAX) 70 MG tablet TAKE 1 TABLET  ONCE A WEEK. 12 tablet 2  . allopurinol (ZYLOPRIM) 100 MG tablet Take 1 tablet (100 mg total) by mouth daily. 90 tablet 2  . amLODipine (NORVASC) 10 MG tablet TAKE 1 TABLET EVERY DAY 90 tablet 1  . atenolol (TENORMIN) 25 MG tablet Take 1 tablet (25 mg total) by mouth daily. 90 tablet 3  . cholecalciferol (VITAMIN D) 1000 units tablet Take 1 tablet (1,000 Units total) by mouth daily. 30 tablet 11  . diclofenac sodium (VOLTAREN) 1 % GEL Apply 2 g topically 4 (four) times daily as needed. 100 g 4  . famotidine (PEPCID) 20 MG tablet Take 1 tablet (20 mg total) by mouth 2 (two) times daily. 180 tablet 3  . fish oil-omega-3 fatty acids 1000 MG capsule Take 1 g by mouth every 30 (thirty) days.     . fluticasone (FLONASE) 50 MCG/ACT nasal spray Place 2 sprays into both nostrils daily as needed for rhinitis. 16 g 2  . hydrochlorothiazide (HYDRODIURIL) 25 MG tablet Take 1 tablet (25 mg total) by mouth daily. 90 tablet 3  . Hyprom-Naphaz-Polysorb-Zn Sulf (CLEAR EYES COMPLETE  OP) Place 1 drop into both eyes daily as needed (dry eyes).     [name] lidocaine-prilocaine (EMLA) cream Apply 1 application topically as needed. 30 g 2  . lisinopril (ZESTRIL) 40 MG tablet Take 1 tablet (40 mg total) by mouth daily. 90 tablet 3  . loratadine (CLARITIN) 10 MG tablet Take 1 tablet (10 mg total) by mouth daily as needed for allergies. 30 tablet 3  . meloxicam (MOBIC) 15 MG tablet Take 1 tablet (15 mg total) by mouth daily. 10 tablet 0  . metFORMIN (GLUCOPHAGE) 500 MG tablet TAKE 2 TABLETS TWICE DAILY WITH MEALS 360 tablet 3  . Multiple Vitamin (MULTIVITAMIN WITH MINERALS) TABS tablet Take 1 tablet by mouth daily.    . ondansetron (ZOFRAN) 8 MG tablet Take 1 tablet (8 mg total) by mouth 2 (two) times daily as needed for refractory nausea / vomiting. Start on day 3 after  chemotherapy. 30 tablet 2  . prochlorperazine (COMPAZINE) 10 MG tablet Take 1 tablet (10 mg total) by mouth every 6 (six) hours as needed for nausea or vomiting. 30 tablet 2  . Spacer/Aero-Holding Chambers (AEROCHAMBER MV) inhaler Use as instructed 1 each 0  . VITAMIN E PO Take 1 tablet by mouth daily.      No current facility-administered medications for this visit.    SURGICAL HISTORY:  Past Surgical History:  Procedure Laterality Date  . ABDOMINAL HYSTERECTOMY    . ESOPHAGOGASTRODUODENOSCOPY N/A 06/15/2015   Procedure: ESOPHAGOGASTRODUODENOSCOPY (EGD);  Surgeon: Gatha Mayer, MD;  Location: [facility];  Service: Endoscopy;  Laterality: N/A;  . IR REMOVAL TUN ACCESS W/ PORT W/O FL MOD SED  11/11/2016  . LIVER BIOPSY    . OPEN PARTIAL HEPATECTOMY  Left 02/09/15  . PARTIAL HYSTERECTOMY    . ROTATOR CUFF REPAIR       L rotator cuff repair 11/07-[place] - 12/7/200  . TONSILLECTOMY      REVIEW OF SYSTEMS:   Review of Systems  Constitutional: Negative for appetite change, chills, fatigue, fever and unexpected weight change.  HENT: Negative for mouth sores, nosebleeds, sore throat and trouble swallowing.   Eyes:  Negative for eye problems and icterus.  Respiratory: Negative for cough, hemoptysis, shortness of breath and wheezing.  Cardiovascular: Negative for chest pain and leg swelling.  Gastrointestinal: Positive for generalized abdominal pain but worse in RUQ. Positive for two occassions of bright red blood in stool and toilet paper. Negative for constipation, diarrhea, nausea and vomiting.  Genitourinary: Negative for bladder incontinence, difficulty urinating, dysuria, frequency and hematuria.   Musculoskeletal: Negative for back pain, gait problem, neck pain and neck stiffness.  Skin: Negative for itching and rash.  Neurological: Negative for dizziness, extremity weakness, gait problem, headaches, light-headedness and seizures.  Hematological: Negative for adenopathy. Does not bruise/bleed easily.  Psychiatric/Behavioral: Negative for confusion, depression and sleep disturbance. The patient is not nervous/anxious.     PHYSICAL EXAMINATION:  Blood pressure (!) 155/60, pulse 83, temperature 98.2 F (36.8 C), temperature source Temporal, resp. rate 20, height 5' 2"  (1.575 m), weight 185 lb 8 oz (84.1 kg), SpO2 97 %.  ECOG PERFORMANCE STATUS: 1 - Symptomatic but completely ambulatory  Physical Exam  Constitutional: Oriented to person, place, and time and well-developed, well-nourished, and in no distress.  HENT:  Head: Normocephalic and atraumatic.  Mouth/Throat: Oropharynx is clear and moist. No oropharyngeal exudate.  Eyes: Conjunctivae are normal. Right eye exhibits no discharge. Left eye exhibits no discharge. No scleral icterus.  Neck: Normal range of motion. Neck supple.  Cardiovascular: Normal rate, regular rhythm, normal heart sounds and intact distal pulses.   Pulmonary/Chest: Effort normal and breath sounds normal. No respiratory distress. No wheezes. No rales.  Abdominal: Soft. Bowel sounds are normal. Exhibits no distension and no mass. There is no tenderness. Multiple surgical scars  noted on abdomen.  Musculoskeletal: Normal range of motion. Exhibits no edema.  Lymphadenopathy:    No cervical adenopathy.  Neurological: Alert and oriented to person, place, and time. Exhibits normal muscle tone. Gait normal. Coordination normal.  Skin: Skin is warm and dry. No rash noted. Not diaphoretic. No erythema. No pallor.  Psychiatric: Mood, memory and judgment normal.  Vitals reviewed.  LABORATORY DATA: Lab Results  Component Value Date   WBC 7.5 06/11/2019   HGB 12.5 06/11/2019   HCT 40.3 06/11/2019   MCV 90.0 06/11/2019   PLT 117 (L) 06/11/2019  Chemistry      Component Value Date/Time   NA 138 06/11/2019 1307   NA 138 12/03/2018 1604   NA 140 01/29/2017 1106   K 4.4 06/11/2019 1307   K 4.1 01/29/2017 1106   CL 102 06/11/2019 1307   CO2 28 06/11/2019 1307   CO2 29 01/29/2017 1106   BUN 10 06/11/2019 1307   BUN 18 12/03/2018 1604   BUN 15.4 01/29/2017 1106   CREATININE 0.79 06/11/2019 1307   CREATININE 0.8 01/29/2017 1106      Component Value Date/Time   CALCIUM 9.5 06/11/2019 1307   CALCIUM 9.8 01/29/2017 1106   ALKPHOS 131 (H) 06/11/2019 1307   ALKPHOS 109 01/29/2017 1106   AST 31 06/11/2019 1307   AST 22 01/29/2017 1106   ALT 23 06/11/2019 1307   ALT 19 01/29/2017 1106   BILITOT 0.5 06/11/2019 1307   BILITOT 0.28 01/29/2017 1106       RADIOGRAPHIC STUDIES:  No results found.   ASSESSMENT/PLAN:  This is a very pleasant 74 year old African-American female diagnosed with intrahepatic cholangiocarcinoma. The patient was seen with Dr. Burr Medico today.   1) Stage IV recurrent, unresectable intrahepatic invasive cholangiocarcinoma.   -Initially noted on imaging 2014 with a mass in the left side of the liver.  -Status post left lobe of the liver partial hepatectomy on 02/19/15.  Pathology consistent with moderately differentiated invasive cholangiocarcinoma -04/16/14-05/25/14 the patient received concurrent chemoradiation with capecitabine.  The  patient reportedly tolerated this poorly with fatigue, significant weight loss, nausea, and vomiting.  -08/2015 4 cycles of gemcitabine.  Patient intolerant to this treatment with weight loss, anorexia, and fatigue.  -08/27/17 evidence of disease recurrence with ill-defined hypoenhancing lesion in inferior right liver concerning for metastatic disease.  Portacaval lymph node measuring 1.2 cm in short axis.  Biopsy confirmed compatible with cholangiocarcinoma.  -09/24/17-10/15/17 XRT x3 weeks -05/14/19 CT of the chest, abdomen, and pelvis showed infiltrative mass within the porta hepatis encasing the portal triads measuring up to 4.1 cm, concerning for residual/recurrent cholangiocarcinoma.  There was worsening thrombosis/tumor in the vein involving the entire portal venous system now extending into the right portal vein and distal aspects of the splenic vein.  There are at least 6 new liver lesions concerning for multifocal hepatic metastases.  Stable retroperitoneal lymphadenopathy.  Similar appearing distal gastric wall thickening, likely post radiation change.  Stable subcentimeter pulmonary nodules, the largest within the right lower lobe measuring 0.8 cm -Patient referred to the [facility] for consideration of systemic chemotherapy with 5-FU. -Of note, the patient is interested in having her restaging imaging performed at [facility]. -The patient was seen with Dr. Burr Medico today.  Dr. Burr Medico had a lengthy discussion with the patient by her current condition and treatment options.  Dr. Burr Medico gave the patient the option of treatment with 5-FU vs. Xeloda vs. cisplatin and gemcitabine.  -Dr. Burr Medico discussed that the most effective treatment is cisplatin and gemcitabine. However, this treatment regimen will be more intensive.  The patient is not interested at this time in Xeloda or cisplatin/gemcitabine due to her poor tolerance in the past.  Discussed that this will remain an option in the future if the patient  changes her mind.  -The patient is interested and agreeable to pursuing treatment with 5-fluorouracil.  She is expected to start her first dose on 06/17/19.  She was given a handout on information about side effects of 5-fluorouracil. We will monitor her tolerance closely for consideration of treatment change or  dose adjustments.  Additionally, she will have a chemo education class prior to starting her first cycle of chemotherapy which is once every 2 weeks. -I will place referral for Port-A-Cath to be placed.  I have sent a prescription of EMLA cream to her pharmacy and have reviewed how to use this with her.  Her Port-A-Cath is scheduled for 06/15/2019.   -I have sent a prescription for Compazine and Zofran for nausea.  Patient was advised to call us if she has any concerning adverse side effects from her treatment.  2.  Portal vein thrombosis -The patient is currently taking 20 mg p.o. daily of Xarelto. -Patient unsure if she is taking Xarelto. If she is, patient will need to hold xarelto for 24 hours prior to her port-a-cath placement on 06/17/2019. Left voicemail with instructions. Will try calling back.   3. Two episodes of hematochezia -Episodes several months apart.  -No anemia noted on CBC from today. Iron studies and ferritin WNL. Stool cards pending. -Patient states she is overdue for colonoscopy and would like to schedule one.  -Patient follows Dr. [name], [facility]. Number to their office provided to the patient. Will also reach out to Dr. [name] to help facilitate coordinating her colonoscopy. However, discussed with patient we may need to coordinate this procedure around chemotherapy.   4. Weight loss with prior chemotherapy -Significant weight loss and poor tolerance reported with prior chemotherapy with xeloda and gemcitabine. Hx of radiation induced gastritis -Referral placed to [facility] dietician to evaluate patient and provide recommendations for nutritional support for  upcoming chemotherapy. -We will monitor her weight closely at her appointments.    Plan: -Referral to nutrition -Port-cath-placement 06/15/2019.  -Prescriptions for EMLA cream, compazine, and zofran sent to patient's pharmacy -CBC, CMP, CA 19.9, ferritin, stool cards, and iron studies today -Chemoeducation class prior to first dose of chemotherapy  -Labs, flush, return visit, and infusion on 3/11 and D/C pump on 3/13 every 2 weeks.   Orders Placed This Encounter  Procedures  . IR Perc Tun Perit Cath W/Port    Standing Status:   Future    Standing Expiration Date:   08/10/2020    Order Specific Question:   Reason for exam:    Answer:   Chemotherapy    Order Specific Question:   Preferred Imaging Location?    Answer:   [facility]  . CBC with Differential ([place] Only)    Standing Status:   Future    Number of Occurrences:   1    Standing Expiration Date:   06/10/2020  . CMP ([place] only)    Standing Status:   Future    Number of Occurrences:   1    Standing Expiration Date:   06/10/2020  . CA 19.9    Standing Status:   Future    Number of Occurrences:   1    Standing Expiration Date:   06/10/2020  . Ferritin    Standing Status:   Future    Number of Occurrences:   1    Standing Expiration Date:   06/10/2020  . Iron and TIBC    Standing Status:   Future    Number of Occurrences:   1    Standing Expiration Date:   06/10/2020  . Occult blood card to lab, stool    Standing Status:   Future    Standing Expiration Date:   06/10/2020  . Occult blood card to lab, stool    Standing  Status:   Future    Standing Expiration Date:   06/10/2020  . Occult blood card to lab, stool    Standing Status:   Future    Standing Expiration Date:   06/10/2020  . CMP ([place] only)    Standing Status:   Standing    Number of Occurrences:   6    Standing Expiration Date:   06/10/2020  . CBC with Differential ([facility] Only)    Standing Status:   Standing    Number of  Occurrences:   6    Standing Expiration Date:   06/10/2020  . CA 19.9    Standing Status:   Standing    Number of Occurrences:   6    Standing Expiration Date:   06/10/2020  . Ambulatory referral to Nutrition and Diabetic E    Referral Priority:   Routine    Referral Type:   Consultation    Referral Reason:   Specialty Services Required    Number of Visits Requested:   [place], PA-C 06/11/19  Addendum  I have seen the patient, examined her. I agree with the assessment and and plan and have edited the notes.   Ms Birge is a 74 year old female with recurrent cholangiocarcinoma.  I have reviewed her previous medical records, including records at [facility], and confirmed with pt. unfortunately cholangiocarcinoma is not curable at this stage, but still treatable.  Patient is interested in systemic therapy.  I discussed the option of cisplatin and gemcitabine, which is the standard first-line therapy, and other options, such as FOLFOX, single agent gemcitabine, capecitabine and 5-FU it etc.  She previously did not tolerate gemcitabine well, and does not like capecitabine.  She is reluctant to take more intensive chemotherapy such as cisplatin and gemcitabine.  We will proceed with 5-FU pump infusion every 2 weeks with leucovorin.  She plans to return to [place] in 3 months with restaging scan.  We will arrange port placement, chemo class, dietician referral, and start her chemo in 1-2 weeks.  Chemo consent was obtained today.  All questions were answered.  Truitt Merle  06/11/2019

## 2019-06-11 NOTE — Patient Instructions (Signed)
Fluorouracil, 5-FU injection What is this medicine? FLUOROURACIL, 5-FU (flure oh YOOR a sil) is a chemotherapy drug. It slows the growth of cancer cells. This medicine is used to treat many types of cancer like breast cancer, colon or rectal cancer, pancreatic cancer, and stomach cancer. This medicine may be used for other purposes; ask your health care provider or pharmacist if you have questions. COMMON BRAND NAME(S): Adrucil What should I tell my health care provider before I take this medicine? They need to know if you have any of these conditions:  blood disorders  dihydropyrimidine dehydrogenase (DPD) deficiency  infection (especially a virus infection such as chickenpox, cold sores, or herpes)  kidney disease  liver disease  malnourished, poor nutrition  recent or ongoing radiation therapy  an unusual or allergic reaction to fluorouracil, other chemotherapy, other medicines, foods, dyes, or preservatives  pregnant or trying to get pregnant  breast-feeding How should I use this medicine? This drug is given as an infusion or injection into a vein. It is administered in a hospital or clinic by a specially trained health care professional. Talk to your pediatrician regarding the use of this medicine in children. Special care may be needed. Overdosage: If you think you have taken too much of this medicine contact a poison control center or emergency room at once. NOTE: This medicine is only for you. Do not share this medicine with others. What if I miss a dose? It is important not to miss your dose. Call your doctor or health care professional if you are unable to keep an appointment. What may interact with this medicine?  allopurinol  cimetidine  dapsone  digoxin  hydroxyurea  leucovorin  levamisole  medicines for seizures like ethotoin, fosphenytoin, phenytoin  medicines to increase blood counts like filgrastim, pegfilgrastim, sargramostim  medicines that  treat or prevent blood clots like warfarin, enoxaparin, and dalteparin  methotrexate  metronidazole  pyrimethamine  some other chemotherapy drugs like busulfan, cisplatin, estramustine, vinblastine  trimethoprim  trimetrexate  vaccines Talk to your doctor or health care professional before taking any of these medicines:  acetaminophen  aspirin  ibuprofen  ketoprofen  naproxen This list may not describe all possible interactions. Give your health care provider a list of all the medicines, herbs, non-prescription drugs, or dietary supplements you use. Also tell them if you smoke, drink alcohol, or use illegal drugs. Some items may interact with your medicine. What should I watch for while using this medicine? Visit your doctor for checks on your progress. This drug may make you feel generally unwell. This is not uncommon, as chemotherapy can affect healthy cells as well as cancer cells. Report any side effects. Continue your course of treatment even though you feel ill unless your doctor tells you to stop. In some cases, you may be given additional medicines to help with side effects. Follow all directions for their use. Call your doctor or health care professional for advice if you get a fever, chills or sore throat, or other symptoms of a cold or flu. Do not treat yourself. This drug decreases your body's ability to fight infections. Try to avoid being around people who are sick. This medicine may increase your risk to bruise or bleed. Call your doctor or health care professional if you notice any unusual bleeding. Be careful brushing and flossing your teeth or using a toothpick because you may get an infection or bleed more easily. If you have any dental work done, tell your dentist you are  receiving this medicine. Avoid taking products that contain aspirin, acetaminophen, ibuprofen, naproxen, or ketoprofen unless instructed by your doctor. These medicines may hide a fever. Do not  become pregnant while taking this medicine. Women should inform their doctor if they wish to become pregnant or think they might be pregnant. There is a potential for serious side effects to an unborn child. Talk to your health care professional or pharmacist for more information. Do not breast-feed an infant while taking this medicine. Men should inform their doctor if they wish to father a child. This medicine may lower sperm counts. Do not treat diarrhea with over the counter products. Contact your doctor if you have diarrhea that lasts more than 2 days or if it is severe and watery. This medicine can make you more sensitive to the sun. Keep out of the sun. If you cannot avoid being in the sun, wear protective clothing and use sunscreen. Do not use sun lamps or tanning beds/booths. What side effects may I notice from receiving this medicine? Side effects that you should report to your doctor or health care professional as soon as possible:  allergic reactions like skin rash, itching or hives, swelling of the face, lips, or tongue  low blood counts - this medicine may decrease the number of white blood cells, red blood cells and platelets. You may be at increased risk for infections and bleeding.  signs of infection - fever or chills, cough, sore throat, pain or difficulty passing urine  signs of decreased platelets or bleeding - bruising, pinpoint red spots on the skin, black, tarry stools, blood in the urine  signs of decreased red blood cells - unusually weak or tired, fainting spells, lightheadedness  breathing problems  changes in vision  chest pain  mouth sores  nausea and vomiting  pain, swelling, redness at site where injected  pain, tingling, numbness in the hands or feet  redness, swelling, or sores on hands or feet  stomach pain  unusual bleeding Side effects that usually do not require medical attention (report to your doctor or health care professional if they  continue or are bothersome):  changes in finger or toe nails  diarrhea  dry or itchy skin  hair loss  headache  loss of appetite  sensitivity of eyes to the light  stomach upset  unusually teary eyes This list may not describe all possible side effects. Call your doctor for medical advice about side effects. You may report side effects to FDA at 1-800-FDA-1088. Where should I keep my medicine? This drug is given in a hospital or clinic and will not be stored at home. NOTE: This sheet is a summary. It may not cover all possible information. If you have questions about this medicine, talk to your doctor, pharmacist, or health care provider.  2020 [employer] (2007-07-29 13:53:16)

## 2019-06-11 NOTE — Telephone Encounter (Signed)
Port a cath appointment -march 9th arrive at 1 pm to [facility] 1st floor radiology. ( Procedure starts at 1430).   NPO  after 8 am , sips of water after 8 am   I LVM for pt with the following instructions.  I LVM on pts phone and to call me back.

## 2019-06-12 ENCOUNTER — Encounter: Payer: Self-pay | Admitting: Physician Assistant

## 2019-06-14 ENCOUNTER — Telehealth: Payer: Self-pay | Admitting: *Deleted

## 2019-06-14 ENCOUNTER — Telehealth: Payer: Self-pay

## 2019-06-14 ENCOUNTER — Other Ambulatory Visit: Payer: Self-pay | Admitting: Hematology

## 2019-06-14 ENCOUNTER — Other Ambulatory Visit: Payer: Self-pay | Admitting: Radiology

## 2019-06-14 DIAGNOSIS — C221 Intrahepatic bile duct carcinoma: Secondary | ICD-10-CM

## 2019-06-14 MED ORDER — ONDANSETRON HCL 8 MG PO TABS: 8.0000 mg | ORAL_TABLET | Freq: Two times a day (BID) | ORAL | 1 refills | Status: DC | PRN

## 2019-06-14 MED ORDER — LIDOCAINE-PRILOCAINE 2.5-2.5 % EX CREA: TOPICAL_CREAM | CUTANEOUS | 3 refills | Status: DC

## 2019-06-14 MED ORDER — PROCHLORPERAZINE MALEATE 10 MG PO TABS: 10.0000 mg | ORAL_TABLET | Freq: Four times a day (QID) | ORAL | 1 refills | Status: DC | PRN

## 2019-06-14 NOTE — Telephone Encounter (Signed)
Spoke with patient about upcoming PCA placement.  Relayed instructions to her.  She additionally confirmed she DOES NOT take a blood thinner. States she never picked it up before at pharmacy.  Was able to relay instructions back in return.

## 2019-06-14 NOTE — Telephone Encounter (Signed)
-----   Message from [name], MD sent at 06/13/2019  8:25 AM EST ----- Regarding: RE: Scheduling colonoscopy Bonnie Peterson, Thanks.  The most recent polyp surveillance guidelines would put her proper colonoscopy recall at 7 years rather than 5 and so she isn't really overdue for colonoscopy (by current guidelines). We will reach out to her to discuss her bleeding, consider repeat colonoscopy for bleeding if indicated.  Thanks  Adrienne Mocha, She needs OV with me, next available for rectal bleeding, history of colon polyps.  Thanks  ----- Message ----- From: Heilingoetter, Tobe Sos, PA-C Sent: 06/11/2019   4:32 PM EST To: [name], MD Subject: Scheduling colonoscopy                         Good Afternoon Dr. [name],   This patient mutual patient was seen with [place] at the cancer center today to discuss treatment for recurrent intrahepatic cholangiocarcinoma. The patient states she last had a colonoscopy in 2015. At that time, two tubular adenomas were found. It was recommended to have a follow up colonoscopy in 5 years. She had two minor incidences in November and December 2020 with bright red blood mixed in her stool and on toilet paper. Hbg, ferritin, and iron studies WNL. Pending return of FOBT. The patient is concerned about her rectal bleeding and ask that I reach out to you to help facilitate scheduling her follow up/overdue colonoscopy. Of note, she will begin chemotherapy soon ~3/11 which is given every 2 weeks. If she were to be scheduled in the next few weeks/months, it would be best to be in-between her treatments. Have a wonderful weekend.   -Bonnie Peterson

## 2019-06-14 NOTE — Progress Notes (Signed)
[place]   Telephone:([phone]) [phone] [phone]   Clinic Follow up Note   Patient Care Team: Lattie Haw, MD as PCP - General [place], York Ram, MD (Inactive) as Resident (Family Medicine) Jonnie Finner, RN as Oncology Nurse Navigator Jonnie Finner, RN as Oncology Nurse Navigator  Date of Service:  06/21/2019  CHIEF COMPLAINT: F/u for Metastatic Cholangiocarcinoma   SUMMARY OF ONCOLOGIC HISTORY: Oncology History  Intrahepatic cholangiocarcinoma ([place])  2014 Initial Diagnosis   Intrahepatic cholangiocarcinoma ([place])   2014 Initial Diagnosis   Per notes from [facility] by Dr. Mettu 1. 2014 Mass in the left side of the liver incidentally found during work up for acute pancreatitis.  The mass was thought to be a bile duct lesion and was followed up.  2. 02/2013 MRI hyperdense focus within the left hepatic lobe.  3. 02/21/13 CT scan 3.7 x 2.1 x 2.4 cm arterial enhancing lesion.  AFP and CEA were normal.  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 4. 05/18/12 Biopsy of something (surg path in Care Everywhere, but no report) 5. 07/02/13 Biopsy of something (surg path in Care Everywhere, but no report) 6. 09/02/13 MRI lesion in segment 4A with adjacent perfusional enhancement with some washout. Lesion is close to left portal vein. 7. 09/15/14 MRI segment 4A lesion slightly increased in size, minimal fat containing, peripheral enhancement and arterial enhancement. Left portal vein is clearly not involved.   02/19/2015 Surgery   Per notes from [facility] by Dr. Dennison Nancy L lobe of liver partial hepatectomy segments 2, 3, 4a, 4b invasive cholangiocarcinoma, moderately differentiated. Tumor focally extends to the parenchymal margin of resection. Uninvolved liver parenchyma with steatohepatitis and associated periportal and centrilobular pericellular fibrosis. Scattered von Meyenburg complexes. One hepatic artery LN with metastatic cholangiocarcinoma.  Fibroadipose tissue and nerve negative for malignancy. Gallbladder negative for malignancy. One LN negative for malignancy. T2aN1 invasive cholangiocarcinoma, moderately differentiated, with positive hepatic parenchymal margin, and 1/2 nodes positive for tumor involvement. -Post operative course was complicated by fevers felt to be related to atelectasis   04/17/2015 Concurrent Chemotherapy   Per notes from [facility] by Dr. Dennison Nancy -04/17/15-05/26/15 Chemoradiation with capecitabine (locally) 50.4 Gy in 28 fractions.  -Patient started getting sick, hospitalized, fluids twice, was on ondansetron, and propranolol, and sucralfate  -EGD found ulcers in stomach  -06/14/15 CTA (per Dr. Shayne Alken note) no evidence of disease recurrence.  Formation of the stomach and duodenum was seen.   08/2015 - 2017 Chemotherapy   08/2015 4 cycles of gemcitabine.  Patient intolerant to this treatment with weight loss, anorexia, and fatigue.    10/16/2015 Surgery   Per notes from [facility] by Dr. Dennison Nancy -CTA chest abdomen and pelvis status post left hepatectomy with no evidence of recurrent disease or metastatic disease.  Right lower lobe pulmonary nodules unchanged.  CA 19-9 7   08/11/2017 Pathology Results   Per notes from [facility] by Dr. Dennison Nancy -07/29/17 CT abd -U/S biopsy of liver    08/27/2017 Imaging   Per note from [facility] by Dr. Dennison Nancy --08/25/17 CT chest, abdomen, and pelvis ill-defined hypoenhancing lesion in the inferior right liver concerning for m metastatic disease. Portocaval node measuring up to 1.2 cm in short axis   08/28/2017 Pathology Results   Per note from [facility] by Dr. Dennison Nancy -Invasive moderate [place]ward differentiated adenocarcinoma morphologically compatible with cholangio- -09/02/17 MDC consensus appears consistent with intrahepatic cholangiocarcinoma and surgical resection not appropriate.  If after systemic therapy she remains stable, no  extrahepatic disease, could consider HAI pump in future.  Ablation not an option.     09/24/2017 - 10/15/2017 Radiation Therapy   Per note from [facility] by Dr. Dennison Nancy -XRT x3 weeks   12/30/2017 Imaging   Per note from [facility] by Dr. Dennison Nancy CT chest, abdomen, and pelvis slight interval increase in size of metastatic lesion in the liver with increasing necrosis.  Stable right lower lobe pulmonary nodule and portacaval lymph nodes   10/14/2018 Imaging   Per note from [facility] by Dr. Dennison Nancy CT chest, abdomen, and pelvis decrease size of liver lesions, PVT new, 1 to centimeter lymph node   05/14/2019 Imaging   Per note from [facility] by Dr. Dennison Nancy -CT infiltrative mass within the porta hepatis encasing the portal triads measuring up to 4.1 cm, concerning for residual/recurrent cholangiocarcinoma.  There is worsening thrombosis/tumor in the vein involving the entire portal venous system now extending into the right portal vein and distal aspect of the splenic vein.  There are at least 6 new liver lesions concerning for multifocal hepatic metastases.  Stable retroperitoneal lymphadenopathy.  Similar appearing distal gastric wall thickening, likely post radiation changes.  Stable subcentimeter pulmonary nodules, the largest within the right lower lobe measuring 0.8 cm.  Recommend continued attention to follow-up.   05/14/2019 Cancer Staging   Staging form: Intrahepatic Bile Duct, AJCC 8th Edition - Clinical stage from 05/14/2019: Stage IV (cTX, cNX, cM1) - Signed by Truitt Merle, MD on 06/20/2019   06/21/2019 -  Chemotherapy   First-line 5-fluorouracil and leucovorin IV every 2 weeks starting 06/21/19      CURRENT THERAPY:  First-line 5-fluorouracil and leucovorin IV every 2 weeks starting 06/21/19  INTERVAL HISTORY:  Bonnie Peterson is here for a follow up of Intrahepatic Cholangiocarcinoma. She was previously under the care of Dr. Irene Limbo who she last saw in 08/2017. She has now transferred her care to me. She had her PAC placed last week, tolerating well. She notes she has antiemetics at home. I reviewed medication  list with her.    REVIEW OF SYSTEMS:   Constitutional: Denies fevers, chills or abnormal weight loss Eyes: Denies blurriness of vision Ears, nose, mouth, throat, and face: Denies mucositis or sore throat Respiratory: Denies cough, dyspnea or wheezes Cardiovascular: Denies palpitation, chest discomfort or lower extremity swelling Gastrointestinal:  Denies nausea, heartburn or change in bowel habits Skin: Denies abnormal skin rashes Lymphatics: Denies new lymphadenopathy or easy bruising Neurological:Denies numbness, tingling or new weaknesses Behavioral/Psych: Mood is stable, no new changes  All other systems were reviewed with the patient and are negative.  MEDICAL HISTORY:  Past Medical History:  Diagnosis Date  . Anxiety   . Benign positional vertigo   . Cancer ([place]) 02/09/15   intrahepatic cholangiocarcinoma  . COPD (chronic obstructive pulmonary disease) ([place])   . Depression   . Diabetes mellitus without complication ([place])   . Early cataracts, bilateral 10/13   Optho, Dr Gershon Crane  . Echocardiogram findings abnormal, without diagnosis 10/10   10/10: mild pulm HTN, EF 60-65%, mild LVH, moderate aortic regurg  . GERD (gastroesophageal reflux disease)    I have "acid reflux"  . Gout   . HLD (hyperlipidemia)   . HTN, goal below 130/80   . Irritable bowel syndrome   . Obesity, Class III, BMI 40-49.9 (morbid obesity) ([place])   . Obstructive sleep apnea    wears cpap  . Osteoarthritis (arthritis due to wear and tear of joints)    also gout  . Restrictive  lung disease     SURGICAL HISTORY: Past Surgical History:  Procedure Laterality Date  . ABDOMINAL HYSTERECTOMY    . ESOPHAGOGASTRODUODENOSCOPY N/A 06/15/2015   Procedure: ESOPHAGOGASTRODUODENOSCOPY (EGD);  Surgeon: Gatha Mayer, MD;  Location: [facility];  Service: Endoscopy;  Laterality: N/A;  . IR IMAGING GUIDED PORT INSERTION  06/15/2019  . IR REMOVAL TUN ACCESS W/ PORT W/O FL MOD SED  11/11/2016  . LIVER BIOPSY    .  OPEN PARTIAL HEPATECTOMY  Left 02/09/15  . PARTIAL HYSTERECTOMY    . ROTATOR CUFF REPAIR       L rotator cuff repair 11/07-[place] - 12/7/200  . TONSILLECTOMY      I have reviewed the social history and family history with the patient and they are unchanged from previous note.  ALLERGIES:  is allergic to other.  MEDICATIONS:  Current Outpatient Medications  Medication Sig Dispense Refill  . albuterol (PROAIR HFA) 108 (90 Base) MCG/ACT inhaler INHALE 2 PUFFS INTO THE LUNGS EVERY 4 HOURS AS NEEDED FOR WHEEZING OR SHORTNESS OF BREATH 1 Inhaler 0  . alendronate (FOSAMAX) 70 MG tablet TAKE 1 TABLET  ONCE A WEEK. 12 tablet 2  . allopurinol (ZYLOPRIM) 100 MG tablet Take 1 tablet (100 mg total) by mouth daily. 90 tablet 2  . amLODipine (NORVASC) 10 MG tablet TAKE 1 TABLET EVERY DAY 90 tablet 1  . atenolol (TENORMIN) 25 MG tablet Take 1 tablet (25 mg total) by mouth daily. 90 tablet 3  . cholecalciferol (VITAMIN D) 1000 units tablet Take 1 tablet (1,000 Units total) by mouth daily. 30 tablet 11  . diclofenac sodium (VOLTAREN) 1 % GEL Apply 2 g topically 4 (four) times daily as needed. 100 g 4  . famotidine (PEPCID) 20 MG tablet Take 1 tablet (20 mg total) by mouth 2 (two) times daily. 180 tablet 3  . fish oil-omega-3 fatty acids 1000 MG capsule Take 1 g by mouth every 30 (thirty) days.     . fluticasone (FLONASE) 50 MCG/ACT nasal spray Place 2 sprays into both nostrils daily as needed for rhinitis. 16 g 2  . hydrochlorothiazide (HYDRODIURIL) 25 MG tablet Take 1 tablet (25 mg total) by mouth daily. 90 tablet 3  . Hyprom-Naphaz-Polysorb-Zn Sulf (CLEAR EYES COMPLETE OP) Place 1 drop into both eyes daily as needed (dry eyes).     [name] lidocaine-prilocaine (EMLA) cream Apply to affected area once 30 g 3  . lidocaine-prilocaine (EMLA) cream Apply 1 application topically every 14 (fourteen) days. Every 14 days as needed 30 g 2  . lisinopril (ZESTRIL) 40 MG tablet Take 1 tablet (40 mg total) by mouth daily. 90  tablet 3  . loratadine (CLARITIN) 10 MG tablet Take 1 tablet (10 mg total) by mouth daily as needed for allergies. 30 tablet 3  . meloxicam (MOBIC) 15 MG tablet Take 1 tablet (15 mg total) by mouth daily. 10 tablet 0  . metFORMIN (GLUCOPHAGE) 500 MG tablet TAKE 2 TABLETS TWICE DAILY WITH MEALS 360 tablet 3  . Multiple Vitamin (MULTIVITAMIN WITH MINERALS) TABS tablet Take 1 tablet by mouth daily.    . ondansetron (ZOFRAN) 8 MG tablet Take 1 tablet (8 mg total) by mouth 2 (two) times daily as needed for refractory nausea / vomiting. Start on day 3 after chemotherapy. 30 tablet 2  . ondansetron (ZOFRAN) 8 MG tablet Take 1 tablet (8 mg total) by mouth 2 (two) times daily as needed (Nausea or vomiting). 30 tablet 1  . prochlorperazine (COMPAZINE) 10 MG tablet  Take 1 tablet (10 mg total) by mouth every 6 (six) hours as needed for nausea or vomiting. 30 tablet 2  . prochlorperazine (COMPAZINE) 10 MG tablet Take 1 tablet (10 mg total) by mouth every 6 (six) hours as needed (Nausea or vomiting). 30 tablet 1  . Spacer/Aero-Holding Chambers (AEROCHAMBER MV) inhaler Use as instructed 1 each 0  . VITAMIN E PO Take 1 tablet by mouth daily.      No current facility-administered medications for this visit.    PHYSICAL EXAMINATION: ECOG PERFORMANCE STATUS: 1 - Symptomatic but completely ambulatory  Vitals:   06/21/19 0839  BP: (!) 155/65  Pulse: 66  Resp: 18  Temp: 97.9 F (36.6 C)  SpO2: 99%   Filed Weights   06/21/19 0839  Weight: 184 lb 8 oz (83.7 kg)    Due to COVID19 we will limit examination to appearance. Patient had no complaints.  GENERAL:alert, no distress and comfortable SKIN: skin color normal, no rashes or significant lesions EYES: normal, Conjunctiva are pink and non-injected, sclera clear  NEURO: alert & oriented x 3 with fluent speech   LABORATORY DATA:  I have reviewed the data as listed CBC Latest Ref Rng & Units 06/21/2019 06/15/2019 06/11/2019  WBC 4.0 - 10.5 K/uL 6.9 8.2 7.5    Hemoglobin 12.0 - 15.0 g/dL 11.6(L) 12.9 12.5  Hematocrit 36.0 - 46.0 % 37.4 42.0 40.3  Platelets 150 - 400 K/uL 133(L) 169 117(L)     CMP Latest Ref Rng & Units 06/21/2019 06/11/2019 12/03/2018  Glucose 70 - 99 mg/dL 208(H) 146(H) 144(H)  BUN 8 - 23 mg/dL 15 10 18   Creatinine 0.44 - 1.00 mg/dL 0.84 0.79 0.88  Sodium 135 - 145 mmol/L 139 138 138  Potassium 3.5 - 5.1 mmol/L 3.9 4.4 5.1  Chloride 98 - 111 mmol/L 103 102 97  CO2 22 - 32 mmol/L 27 28 26   Calcium 8.9 - 10.3 mg/dL 9.2 9.5 9.7  Total Protein 6.5 - 8.1 g/dL 7.0 7.6 -  Total Bilirubin 0.3 - 1.2 mg/dL 0.3 0.5 -  Alkaline Phos 38 - 126 U/L 105 131(H) -  AST 15 - 41 U/L 24 31 -  ALT 0 - 44 U/L 16 23 -      RADIOGRAPHIC STUDIES: I have personally reviewed the radiological images as listed and agreed with the findings in the report. No results found.   ASSESSMENT & PLAN:  Bonnie Peterson is a 74 y.o. female with COPD, Depression, DM, Gout, HLD, HTN, Osteoarthritis, sleep apnea.    1. 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 She was treated with adjuvant concurrent ChemoRT with Xeloda for about 6 weeks.  -Based on 08/2017 CT scan and Liver biopsy at [facility] she had cancer recurrence with metastasis. She was then treated with Radiation for about 3 weeks  -Her 05/14/19 CT from [place] shows infiltrative mass within the porta hepatis encasing the portal triads measuring up to 4.1 cm, concerning for residual/recurrent cholangiocarcinoma. She is stage IV and her cancer is no longer curable at this point but still treatable.  -Given poor tolerance to more intensive treatment in the past, I will start her on first-line 5-fluorouracil and leucovorin IV every 2 weeks starting today (06/21/19). Goal of therapy is to control her disease and prolong her life.  -She had PAC  placed last week.  -Labs  reviewed, mild anemia and thrombocytopenia, Overall adequate to proceed with first cycle 5FU and leucovorin. I encouraged her to contact clinic with any significant or unexpected side effects or concerns.   -Plan to scan her after cycle 5-6 chemo to monitor response.  -F/u in 2 weeks    2. DM, HTN -Will watch on chemo. We discussed that chemo could affect her BP and BG. I recommend she watch her BP at home.  -She is apprehensive about using BG monitor given concerns of finger numbness.    3. Mild anemia and thrombocytopenia -Lab today show Hg 11.6, plt 133K (06/21/19). Will watch on chemo.   4. Goal of care discussion  -We again discussed the incurable nature of her cancer, and the overall poor prognosis, especially if she does not have good response to chemotherapy or progress on chemo -The patient understands the goal of care is palliative. -she is full code now   PLAN:  -Labs reviewed and adequate to proceed with C1 5FU and Leucovorin.  -Lab, flush, f/u and chemo in 2, 4, 6 weeks    No problem-specific Assessment & Plan notes found for this encounter.   No orders of the defined types were placed in this encounter.  All questions were answered. The patient knows to call the clinic with any problems, questions or concerns. No barriers to learning was detected. The total time spent in the appointment was 30 minutes.     Truitt Merle, MD 06/21/2019   I, Joslyn Devon, am acting as scribe for Truitt Merle, MD.   I have reviewed the above documentation for accuracy and completeness, and I agree with the above.

## 2019-06-14 NOTE — Progress Notes (Signed)
START OFF PATHWAY REGIMEN - Other   [id number]:Fluorouracil + Leucovorin q14 Days (mod de Gram[place]):   A cycle is every 14 days:     Leucovorin      Fluorouracil      Fluorouracil   **Always confirm dose/schedule in your pharmacy ordering system**  Patient Characteristics: Intent of Therapy: Non-Curative / Palliative Intent, Discussed with Patient

## 2019-06-14 NOTE — Telephone Encounter (Signed)
The pt has been scheduled for an appt and a letter has been mailed to the pt with appt information.  Unable to reach pt by phone.

## 2019-06-15 ENCOUNTER — Ambulatory Visit (HOSPITAL_COMMUNITY)
Admission: RE | Admit: 2019-06-15 | Discharge: 2019-06-15 | Disposition: A | Discharge status: Home / Self Care | Payer: Medicare HMO | Source: Ambulatory Visit | Attending: Physician Assistant | Admitting: Physician Assistant

## 2019-06-15 ENCOUNTER — Encounter (HOSPITAL_COMMUNITY): Payer: Self-pay

## 2019-06-15 ENCOUNTER — Other Ambulatory Visit: Payer: Self-pay

## 2019-06-15 ENCOUNTER — Other Ambulatory Visit: Payer: Self-pay | Admitting: Physician Assistant

## 2019-06-15 DIAGNOSIS — Z8249 Family history of ischemic heart disease and other diseases of the circulatory system: Secondary | ICD-10-CM | POA: Insufficient documentation

## 2019-06-15 DIAGNOSIS — K219 Gastro-esophageal reflux disease without esophagitis: Secondary | ICD-10-CM | POA: Insufficient documentation

## 2019-06-15 DIAGNOSIS — Z87891 Personal history of nicotine dependence: Secondary | ICD-10-CM | POA: Diagnosis not present

## 2019-06-15 DIAGNOSIS — M109 Gout, unspecified: Secondary | ICD-10-CM | POA: Diagnosis not present

## 2019-06-15 DIAGNOSIS — K589 Irritable bowel syndrome without diarrhea: Secondary | ICD-10-CM | POA: Diagnosis not present

## 2019-06-15 DIAGNOSIS — Z7984 Long term (current) use of oral hypoglycemic drugs: Secondary | ICD-10-CM | POA: Diagnosis not present

## 2019-06-15 DIAGNOSIS — Z833 Family history of diabetes mellitus: Secondary | ICD-10-CM | POA: Diagnosis not present

## 2019-06-15 DIAGNOSIS — E669 Obesity, unspecified: Secondary | ICD-10-CM | POA: Diagnosis not present

## 2019-06-15 DIAGNOSIS — Z79899 Other long term (current) drug therapy: Secondary | ICD-10-CM | POA: Diagnosis not present

## 2019-06-15 DIAGNOSIS — C221 Intrahepatic bile duct carcinoma: Secondary | ICD-10-CM | POA: Diagnosis not present

## 2019-06-15 DIAGNOSIS — M1990 Unspecified osteoarthritis, unspecified site: Secondary | ICD-10-CM | POA: Insufficient documentation

## 2019-06-15 DIAGNOSIS — Z5111 Encounter for antineoplastic chemotherapy: Secondary | ICD-10-CM | POA: Diagnosis not present

## 2019-06-15 DIAGNOSIS — I10 Essential (primary) hypertension: Secondary | ICD-10-CM | POA: Insufficient documentation

## 2019-06-15 DIAGNOSIS — G4733 Obstructive sleep apnea (adult) (pediatric): Secondary | ICD-10-CM | POA: Diagnosis not present

## 2019-06-15 DIAGNOSIS — Z452 Encounter for adjustment and management of vascular access device: Secondary | ICD-10-CM | POA: Diagnosis not present

## 2019-06-15 DIAGNOSIS — H269 Unspecified cataract: Secondary | ICD-10-CM | POA: Diagnosis not present

## 2019-06-15 DIAGNOSIS — E119 Type 2 diabetes mellitus without complications: Secondary | ICD-10-CM | POA: Diagnosis not present

## 2019-06-15 DIAGNOSIS — E785 Hyperlipidemia, unspecified: Secondary | ICD-10-CM | POA: Insufficient documentation

## 2019-06-15 DIAGNOSIS — J449 Chronic obstructive pulmonary disease, unspecified: Secondary | ICD-10-CM | POA: Diagnosis not present

## 2019-06-15 HISTORY — PX: IR IMAGING GUIDED PORT INSERTION: IMG5740

## 2019-06-15 LAB — CBC WITH DIFFERENTIAL/PLATELET
Abs Immature Granulocytes: 0.02 10*3/uL (ref 0.00–0.07)
Basophils Absolute: 0.1 10*3/uL (ref 0.0–0.1)
Basophils Relative: 1 %
Eosinophils Absolute: 0.1 10*3/uL (ref 0.0–0.5)
Eosinophils Relative: 2 %
HCT: 42 % (ref 36.0–46.0)
Hemoglobin: 12.9 g/dL (ref 12.0–15.0)
Immature Granulocytes: 0 %
Lymphocytes Relative: 26 %
Lymphs Abs: 2.1 10*3/uL (ref 0.7–4.0)
MCH: 27.9 pg (ref 26.0–34.0)
MCHC: 30.7 g/dL (ref 30.0–36.0)
MCV: 90.9 fL (ref 80.0–100.0)
Monocytes Absolute: 0.9 10*3/uL (ref 0.1–1.0)
Monocytes Relative: 11 %
Neutro Abs: 5 10*3/uL (ref 1.7–7.7)
Neutrophils Relative %: 60 %
Platelets: 169 10*3/uL (ref 150–400)
RBC: 4.62 MIL/uL (ref 3.87–5.11)
RDW: 15.6 % — ABNORMAL HIGH (ref 11.5–15.5)
WBC: 8.2 10*3/uL (ref 4.0–10.5)
nRBC: 0 % (ref 0.0–0.2)

## 2019-06-15 LAB — PROTIME-INR
INR: 1 (ref 0.8–1.2)
Prothrombin Time: 13.5 seconds (ref 11.4–15.2)

## 2019-06-15 MED ORDER — HEPARIN SOD (PORK) LOCK FLUSH 100 UNIT/ML IV SOLN
INTRAVENOUS | Status: AC
  Filled 2019-06-15: qty 5

## 2019-06-15 MED ORDER — ONDANSETRON HCL 4 MG/2ML IJ SOLN
INTRAMUSCULAR | Status: AC | PRN
  Administered 2019-06-15: 4 mg via INTRAVENOUS

## 2019-06-15 MED ORDER — LIDOCAINE-EPINEPHRINE 1 %-1:100000 IJ SOLN
INTRAMUSCULAR | Status: AC
  Filled 2019-06-15: qty 1

## 2019-06-15 MED ORDER — CEFAZOLIN SODIUM-DEXTROSE 2-4 GM/100ML-% IV SOLN
INTRAVENOUS | Status: AC
  Administered 2019-06-15: 2 g via INTRAVENOUS
  Filled 2019-06-15: qty 100

## 2019-06-15 MED ORDER — ONDANSETRON HCL 4 MG/2ML IJ SOLN
INTRAMUSCULAR | Status: AC
  Filled 2019-06-15: qty 2

## 2019-06-15 MED ORDER — FENTANYL CITRATE (PF) 100 MCG/2ML IJ SOLN
INTRAMUSCULAR | Status: AC | PRN
  Administered 2019-06-15: 25 ug via INTRAVENOUS
  Administered 2019-06-15: 50 ug via INTRAVENOUS
  Administered 2019-06-15: 25 ug via INTRAVENOUS

## 2019-06-15 MED ORDER — MIDAZOLAM HCL 2 MG/2ML IJ SOLN
INTRAMUSCULAR | Status: AC
  Filled 2019-06-15: qty 4

## 2019-06-15 MED ORDER — MIDAZOLAM HCL 2 MG/2ML IJ SOLN
INTRAMUSCULAR | Status: AC | PRN
  Administered 2019-06-15: 1 mg via INTRAVENOUS
  Administered 2019-06-15 (×2): 0.5 mg via INTRAVENOUS

## 2019-06-15 MED ORDER — FENTANYL CITRATE (PF) 100 MCG/2ML IJ SOLN
INTRAMUSCULAR | Status: AC
  Filled 2019-06-15: qty 2

## 2019-06-15 MED ORDER — LIDOCAINE-EPINEPHRINE (PF) 1 %-1:200000 IJ SOLN
INTRAMUSCULAR | Status: AC | PRN
  Administered 2019-06-15: 10 mL

## 2019-06-15 MED ORDER — SODIUM CHLORIDE 0.9 % IV SOLN: INTRAVENOUS | Status: DC

## 2019-06-15 MED ORDER — CEFAZOLIN SODIUM-DEXTROSE 2-4 GM/100ML-% IV SOLN: 2.0000 g | INTRAVENOUS | Status: AC

## 2019-06-15 NOTE — Sedation Documentation (Signed)
Pt  Had N/V post procedure 33m zofran given

## 2019-06-15 NOTE — Procedures (Signed)
Pre Procedure Dx: Poor venous access Post Procedural Dx: Same  Successful placement of right IJ approach port-a-cath with tip at the superior caval atrial junction. The catheter is ready for immediate use.  Estimated Blood Loss: Minimal Complications: None immediate.  Ronny Bacon, MD Pager #: [phone]

## 2019-06-15 NOTE — Consult Note (Signed)
Chief Complaint: Chemotherapy access  Referring Physician(s): Heilingoetter,Cassandra L  Supervising Physician: Sandi Mariscal  Patient Status: [name] - Out-pt  History of Present Illness: Bonnie Peterson is a 74 y.o. 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 Team is requesting portacath placement for chemotherapy access.   Past Medical History:  Diagnosis Date  . Anxiety   . Benign positional vertigo   . Cancer ([place]) 02/09/15   intrahepatic cholangiocarcinoma  . COPD (chronic obstructive pulmonary disease) ([place])   . Depression   . Diabetes mellitus without complication ([place])   . Early cataracts, bilateral 10/13   Optho, Dr Gershon Crane  . Echocardiogram findings abnormal, without diagnosis 10/10   10/10: mild pulm HTN, EF 60-65%, mild LVH, moderate aortic regurg  . GERD (gastroesophageal reflux disease)    I have "acid reflux"  . Gout   . HLD (hyperlipidemia)   . HTN, goal below 130/80   . Irritable bowel syndrome   . Obesity, Class III, BMI 40-49.9 (morbid obesity) ([place])   . Obstructive sleep apnea    wears cpap  . Osteoarthritis (arthritis due to wear and tear of joints)    also gout  . Restrictive lung disease     Past Surgical History:  Procedure Laterality Date  . ABDOMINAL HYSTERECTOMY    . ESOPHAGOGASTRODUODENOSCOPY N/A 06/15/2015   Procedure: ESOPHAGOGASTRODUODENOSCOPY (EGD);  Surgeon: Gatha Mayer, MD;  Location: [facility];  Service: Endoscopy;  Laterality: N/A;  . IR REMOVAL TUN ACCESS W/ PORT W/O FL MOD SED  11/11/2016  . LIVER BIOPSY    . OPEN PARTIAL HEPATECTOMY  Left 02/09/15  . PARTIAL HYSTERECTOMY    . ROTATOR CUFF REPAIR       L rotator cuff repair 11/07-[place] - 12/7/200  . TONSILLECTOMY      Allergies: Other  Medications: Prior to Admission medications   Medication Sig Start Date End Date Taking? Authorizing Provider  albuterol (PROAIR HFA) 108 (90 Base) MCG/ACT inhaler INHALE 2 PUFFS INTO THE  LUNGS EVERY 4 HOURS AS NEEDED FOR WHEEZING OR SHORTNESS OF BREATH 04/20/18  Yes Martyn Ehrich, NP  amLODipine (NORVASC) 10 MG tablet TAKE 1 TABLET EVERY DAY 06/03/19  Yes Lattie Haw, MD  atenolol (TENORMIN) 25 MG tablet Take 1 tablet (25 mg total) by mouth daily. 01/08/19  Yes Lattie Haw, MD  cholecalciferol (VITAMIN D) 1000 units tablet Take 1 tablet (1,000 Units total) by mouth daily. 01/03/17  Yes Orson Eva J, DO  famotidine (PEPCID) 20 MG tablet Take 1 tablet (20 mg total) by mouth 2 (two) times daily. 09/30/18  Yes Orson Eva J, DO  fish oil-omega-3 fatty acids 1000 MG capsule Take 1 g by mouth every 30 (thirty) days.    Yes [provider]  fluticasone (FLONASE) 50 MCG/ACT nasal spray Place 2 sprays into both nostrils daily as needed for rhinitis. 04/17/16  Yes Haney, Alyssa A, MD  hydrochlorothiazide (HYDRODIURIL) 25 MG tablet Take 1 tablet (25 mg total) by mouth daily. 03/02/19  Yes Lattie Haw, MD  Hyprom-Naphaz-Polysorb-Zn Sulf (CLEAR EYES COMPLETE OP) Place 1 drop into both eyes daily as needed (dry eyes).    Yes [provider]  lisinopril (ZESTRIL) 40 MG tablet Take 1 tablet (40 mg total) by mouth daily. 03/02/19  Yes Lattie Haw, MD  loratadine (CLARITIN) 10 MG tablet Take 1 tablet (10 mg total) by mouth daily as needed for allergies. 09/23/12  Yes Waldemar Dickens, MD  metFORMIN (GLUCOPHAGE) 500 MG tablet TAKE 2  TABLETS TWICE DAILY WITH MEALS 03/02/19  Yes Lattie Haw, MD  Multiple Vitamin (MULTIVITAMIN WITH MINERALS) TABS tablet Take 1 tablet by mouth daily.   Yes [provider]  Spacer/Aero-Holding Chambers (AEROCHAMBER MV) inhaler Use as instructed 04/20/18  Yes Martyn Ehrich, NP  VITAMIN E PO Take 1 tablet by mouth daily.    Yes [provider]  alendronate (FOSAMAX) 70 MG tablet TAKE 1 TABLET  ONCE A WEEK. 03/02/19   Lattie Haw, MD  allopurinol (ZYLOPRIM) 100 MG tablet Take 1 tablet (100 mg total) by mouth daily. 12/23/18    Lattie Haw, MD  diclofenac sodium (VOLTAREN) 1 % GEL Apply 2 g topically 4 (four) times daily as needed. 01/26/18   Bufford Lope, DO  lidocaine-prilocaine (EMLA) cream Apply 1 application topically as needed. 06/11/19   Heilingoetter, Cassandra L, PA-C  lidocaine-prilocaine (EMLA) cream Apply to affected area once 06/14/19   Truitt Merle, MD  meloxicam (MOBIC) 15 MG tablet Take 1 tablet (15 mg total) by mouth daily. 01/28/19   Guadalupe Dawn, MD  ondansetron (ZOFRAN) 8 MG tablet Take 1 tablet (8 mg total) by mouth 2 (two) times daily as needed for refractory nausea / vomiting. Start on day 3 after chemotherapy. 06/11/19   Heilingoetter, Cassandra L, PA-C  ondansetron (ZOFRAN) 8 MG tablet Take 1 tablet (8 mg total) by mouth 2 (two) times daily as needed (Nausea or vomiting). 06/14/19   Truitt Merle, MD  prochlorperazine (COMPAZINE) 10 MG tablet Take 1 tablet (10 mg total) by mouth every 6 (six) hours as needed for nausea or vomiting. 06/11/19   Heilingoetter, Cassandra L, PA-C  prochlorperazine (COMPAZINE) 10 MG tablet Take 1 tablet (10 mg total) by mouth every 6 (six) hours as needed (Nausea or vomiting). 06/14/19   Truitt Merle, MD     Family History  Problem Relation Age of Onset  . Breast cancer Mother   . Hypertension Mother   . Coronary artery disease Mother   . Diabetes type II Mother   . Rheum arthritis Mother   . Diabetes type II Sister   . Pancreatic cancer Sister     Social History   Socioeconomic History  . Marital status: Divorced    Spouse name: Not on file  . Number of children: 4  . Years of education: Not on file  . Highest education level: Not on file  Occupational History  . Occupation: unemployed  Tobacco Use  . Smoking status: Former Smoker    Packs/day: 0.50    Years: 49.00    Pack years: 24.50    Types: Cigarettes    Quit date: 04/13/2018    Years since quitting: 1.1  . Smokeless tobacco: Never Used  Substance and Sexual Activity  . Alcohol use: Yes    Alcohol/week: 0.0  standard drinks    Comment: occasionally  . Drug use: Yes    Types: Marijuana  . Sexual activity: Never  Other Topics Concern  . 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 Difficulty of Paying Living Expenses: Not on file  Food Insecurity:   .  Worried About Charity fundraiser in the Last Year: Not on file  . Ran Out of Food in the Last Year: Not on file  Transportation Needs:   . Lack of Transportation (Medical): Not on file  . Lack of Transportation (Non-Medical): Not on file  Physical Activity:   . Days of Exercise per Week: Not on file  . Minutes of Exercise per Session: Not on file  Stress:   . Feeling of Stress : Not on file  Social Connections:   . Frequency of Communication with Friends and Family: Not on file  . Frequency of Social Gatherings with Friends and Family: Not on file  . Attends Religious Services: Not on file  . Active Member of Clubs or Organizations: Not on file  . Attends Archivist Meetings: Not on file  . Marital Status: Not on file    ECOG Status:  Review of Systems: A 12 point ROS discussed and pertinent positives are indicated in the HPI above.  All other systems are negative.  Review of Systems  Constitutional: Negative for fatigue and fever.  HENT: Negative for congestion.   Respiratory: Negative for cough and shortness of breath.   Gastrointestinal: Negative for abdominal pain, diarrhea, nausea and vomiting.    Vital Signs: BP (!) 124/53   Pulse 69   Temp 97.9 F (36.6 C) (Oral)   Resp 18   SpO2 99%   Physical Exam Vitals and nursing note reviewed.  Constitutional:       Appearance: She is well-developed.  HENT:     Head: Normocephalic and atraumatic.  Eyes:     Conjunctiva/sclera: Conjunctivae normal.  Cardiovascular:     Rate and Rhythm: Normal rate and regular rhythm.     Heart sounds: Normal heart sounds.  Pulmonary:     Effort: Pulmonary effort is normal.     Breath sounds: Normal breath sounds.  Musculoskeletal:        General: Normal range of motion.     Cervical back: Normal range of motion.  Skin:    General: Skin is warm.  Neurological:     Mental Status: She is alert and oriented to person, place, and time.     Imaging: No results found.  Labs:  CBC: Recent Labs    06/11/19 1307 06/15/19 1323  WBC 7.5 8.2  HGB 12.5 12.9  HCT 40.3 42.0  PLT 117* 169    COAGS: Recent Labs    06/15/19 1323  INR 1.0    BMP: Recent Labs    12/03/18 1604 06/11/19 1307  NA 138 138  K 5.1 4.4  CL 97 102  CO2 26 28  GLUCOSE 144* 146*  BUN 18 10  CALCIUM 9.7 9.5  CREATININE 0.88 0.79  GFRNONAA 66 >60  GFRAA 76 >60    LIVER FUNCTION TESTS: Recent Labs    06/11/19 1307  BILITOT 0.5  AST 31  ALT 23  ALKPHOS 131*  PROT 7.6  ALBUMIN 3.3*    TUMOR MARKERS: No results for input(s): AFPTM, CEA, CA199, CHROMGRNA in the last 8760 hours.  Assessment and Plan:  74 y.o, female outpatient. 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 Team is requesting portacath placement for chemotherapy access.  Pertinent Imaging 4.23.19 - CT Chest  Pertinent IR History 8.6.18 - removal portacath  6.1.17 - Placement portacath RIJ  Pertinent Allergies none   All labs and medications are within acceptable parameters.  Patient is afebrile.  Risks and benefits of image  guided port-a-catheter placement was discussed with the patient including, but not limited to bleeding, infection, pneumothorax, or fibrin sheath development and need for additional procedures.  All of the patient's questions were  answered, patient is agreeable to proceed. Consent signed and in chart.    Thank you for this interesting consult.  I greatly enjoyed meeting Bonnie Peterson and look forward to participating in their care.  A copy of this report was sent to the requesting provider on this date.  Electronically Signed: Avel Peace, NP 06/15/2019, 1:59 PM   I spent a total of  40 Minutes   in face to face in clinical consultation, greater than 50% of which was counseling/coordinating care for portacath placement

## 2019-06-15 NOTE — Discharge Instructions (Signed)
Do not use lidocaine cream (EMLA cream) on your new port until it has healed. The petroleum in the lidocaine cream will dissolve the skin glue holding the skin together. The skin will come apart resulting in an infection over your new port. Use ice in a zip lock over your new port for 2-3 minutes before the cancer center nurses access your new port.  Implanted Port Insertion, Care After This sheet gives you information about how to care for yourself after your procedure. Your health care provider may also give you more specific instructions. If you have problems or questions, contact your health care provider. What can I expect after the procedure? After the procedure, it is common to have:  Discomfort at the port insertion site.  Bruising on the skin over the port. This should improve over 3-4 days. Follow these instructions at home: [facility] care  After your port is placed, you will get a manufacturer's information card. The card has information about your port. Keep this card with you at all times.  Take care of the port as told by your health care provider. Ask your health care provider if you or a family member can get training for taking care of the port at home. A home health care nurse may also take care of the port.  Make sure to remember what type of port you have. Incision care      Follow instructions from your health care provider about how to take care of your port insertion site. Make sure you: ? Wash your hands with soap and water before and after you change your bandage (dressing). If soap and water are not available, use hand sanitizer. ? Change your dressing as told by your health care provider.  Leave  skin glue in place. These skin closures may need to stay in place for 2 weeks or longer.  Check your port insertion site every day for signs of infection. Check for: ? Redness, swelling, or pain. ? Fluid or blood. ? Warmth. ? Pus or a bad smell. Activity  Return to  your normal activities as told by your health care provider. Ask your health care provider what activities are safe for you.  Do not lift anything that is heavier than 10 lb (4.5 kg), or the limit that you are told, until your health care provider says that it is safe. General instructions  Take over-the-counter and prescription medicines only as told by your health care provider.  Do not take baths, swim, or use a hot tub until your health care provider approves. You may shower tomorrow after removing the dressing. There is skin glue over your incision.  Do not drive for 24 hours if you were given a sedative during your procedure.  Wear a medical alert bracelet in case of an emergency. This will tell any health care providers that you have a port.  Keep all follow-up visits as told by your health care provider. This is important. Contact a health care provider if:  You cannot flush your port with saline as directed, or you cannot draw blood from the port.  You have a fever or chills.  You have redness, swelling, or pain around your port insertion site.  You have fluid or blood coming from your port insertion site.  Your port insertion site feels warm to the touch.  You have pus or a bad smell coming from the port insertion site. Get help right away if:  You have chest pain or  shortness of breath.  You have bleeding from your port that you cannot control. Summary  Take care of the port as told by your health care provider. Keep the manufacturer's information card with you at all times.  Change your dressing as told by your health care provider.  Contact a health care provider if you have a fever or chills or if you have redness, swelling, or pain around your port insertion site.  Keep all follow-up visits as told by your health care provider. This information is not intended to replace advice given to you by your health care provider. Make sure you discuss any questions you  have with your health care provider. Document Revised: 10/21/2017 Document Reviewed: 10/21/2017 Elsevier Patient Education  [employer].      Moderate Conscious Sedation, Adult, Care After These instructions provide you with information about caring for yourself after your procedure. Your health care provider may also give you more specific instructions. Your treatment has been planned according to current medical practices, but problems sometimes occur. Call your health care provider if you have any problems or questions after your procedure. What can I expect after the procedure? After your procedure, it is common:  To feel sleepy for several hours.  To feel clumsy and have poor balance for several hours.  To have poor judgment for several hours.  To vomit if you eat too soon. Follow these instructions at home: For at least 24 hours after the procedure:   Do not: ? Participate in activities where you could fall or become injured. ? Drive. ? Use heavy machinery. ? Drink alcohol. ? Take sleeping pills or medicines that cause drowsiness. ? Make important decisions or sign legal documents. ? Take care of children on your own.  Rest. Eating and drinking  Follow the diet recommended by your health care provider.  If you vomit: ? Drink water, juice, or soup when you can drink without vomiting. ? Make sure you have little or no nausea before eating solid foods. General instructions  Have a responsible adult stay with you until you are awake and alert.  Take over-the-counter and prescription medicines only as told by your health care provider.  If you smoke, do not smoke without supervision.  Keep all follow-up visits as told by your health care provider. This is important. Contact a health care provider if:  You keep feeling nauseous or you keep vomiting.  You feel light-headed.  You develop a rash.  You have a fever. Get help right away if:  You have  trouble breathing. This information is not intended to replace advice given to you by your health care provider. Make sure you discuss any questions you have with your health care provider. Document Revised: 03/07/2017 Document Reviewed: 07/15/2015 Elsevier Patient Education  2020 [employer].

## 2019-06-16 ENCOUNTER — Other Ambulatory Visit: Payer: Self-pay

## 2019-06-16 ENCOUNTER — Inpatient Hospital Stay: Payer: Medicare HMO

## 2019-06-16 DIAGNOSIS — D649 Anemia, unspecified: Secondary | ICD-10-CM | POA: Diagnosis not present

## 2019-06-16 DIAGNOSIS — D696 Thrombocytopenia, unspecified: Secondary | ICD-10-CM | POA: Diagnosis not present

## 2019-06-16 DIAGNOSIS — R634 Abnormal weight loss: Secondary | ICD-10-CM | POA: Diagnosis not present

## 2019-06-16 DIAGNOSIS — E785 Hyperlipidemia, unspecified: Secondary | ICD-10-CM | POA: Diagnosis not present

## 2019-06-16 DIAGNOSIS — C221 Intrahepatic bile duct carcinoma: Secondary | ICD-10-CM | POA: Diagnosis not present

## 2019-06-16 DIAGNOSIS — I10 Essential (primary) hypertension: Secondary | ICD-10-CM | POA: Diagnosis not present

## 2019-06-16 DIAGNOSIS — R197 Diarrhea, unspecified: Secondary | ICD-10-CM | POA: Diagnosis not present

## 2019-06-16 DIAGNOSIS — Z5111 Encounter for antineoplastic chemotherapy: Secondary | ICD-10-CM | POA: Diagnosis not present

## 2019-06-16 DIAGNOSIS — K254 Chronic or unspecified gastric ulcer with hemorrhage: Secondary | ICD-10-CM | POA: Diagnosis not present

## 2019-06-16 LAB — GLUCOSE, CAPILLARY: Glucose-Capillary: 134 mg/dL — ABNORMAL HIGH (ref 70–99)

## 2019-06-16 LAB — CANCER ANTIGEN 19-9: CA 19-9: 12 U/mL (ref 0–35)

## 2019-06-16 LAB — OCCULT BLOOD X 1 CARD TO LAB, STOOL
Fecal Occult Bld: NEGATIVE
Fecal Occult Bld: NEGATIVE
Fecal Occult Bld: NEGATIVE

## 2019-06-17 ENCOUNTER — Other Ambulatory Visit: Payer: Self-pay

## 2019-06-17 ENCOUNTER — Encounter: Payer: Self-pay | Admitting: Family Medicine

## 2019-06-17 ENCOUNTER — Telehealth: Payer: Self-pay | Admitting: *Deleted

## 2019-06-17 ENCOUNTER — Ambulatory Visit (INDEPENDENT_AMBULATORY_CARE_PROVIDER_SITE_OTHER): Payer: Medicare HMO | Admitting: Family Medicine

## 2019-06-17 VITALS — BP 112/58 | HR 71 | Temp 98.0°F | Wt 181.0 lb

## 2019-06-17 DIAGNOSIS — E119 Type 2 diabetes mellitus without complications: Secondary | ICD-10-CM | POA: Diagnosis not present

## 2019-06-17 DIAGNOSIS — C221 Intrahepatic bile duct carcinoma: Secondary | ICD-10-CM | POA: Diagnosis not present

## 2019-06-17 LAB — POCT GLYCOSYLATED HEMOGLOBIN (HGB A1C): HbA1c, POC (controlled diabetic range): 7.2 % — AB (ref 0.0–7.0)

## 2019-06-17 MED ORDER — LIDOCAINE-PRILOCAINE 2.5-2.5 % EX CREA: 1.0000 "application " | TOPICAL_CREAM | CUTANEOUS | 2 refills | Status: DC

## 2019-06-17 NOTE — Progress Notes (Signed)
fax from [facility] requesting dosing frequency on rx for emol cream.  New rx completed and faxed to [facility] mail delivery

## 2019-06-17 NOTE — Patient Instructions (Signed)
Bonnie Peterson,  Thank you so much for coming to see me today.  I am glad that you updated me with the things that are going on with your cancer.  Please know that I am here for you every step of the way if you need me. You are strong and I am proud of you.  Your A1c is very good at 7.2.  I would leave it up to you to discontinue the Metformin if you are experiencing side effects during of chemotherapy.  You have an appointment booked with me on 9 April at 415pm.  I look forward to seeing you then. If you would like to change this appointment to call the clinic and let them know.  Best wishes,  Aerilyn Slee

## 2019-06-17 NOTE — Telephone Encounter (Signed)
Called patient to inform of results for stool cards and iron studies.  All negative.

## 2019-06-17 NOTE — Progress Notes (Signed)
    SUBJECTIVE:   CHIEF COMPLAINT / HPI:   Bonnie Peterson is a 74 year old female who presents today for a general follow-up.  Cholangiocarcinoma recurrence Patient was unfortunately diagnosed with recurrence of cholangiocarcinoma earlier this month when she had a follow-up with her oncologist at [facility] and starts chemotherapy on Monday, 15th March with 5-FU and leucovorin IV.  Patient noticed that she was starting to feel unwell with tiredness and anorexia in November 2020 during Thanksgiving time.  Was 199 pounds at last clinic visit and is now 181 pounds.  Also has vague right-sided abdominal pain 3/10 severity.   Type 2 diabetes Patient takes Metformin 1046m BID. A1c today 7.2 improved from at last visit. Tolerating well and denies side effects. Worried about continuing Metformin with her starting chemotherapy and worried about potential side effects.    PERTINENT  PMH / PSH: Cholangiocarcinoma, T2DM   OBJECTIVE:   BP (!) 112/58   Pulse 71   Temp 98 F (36.7 C) (Oral)   Wt 181 lb (82.1 kg)   SpO2 96%   BMI 33.11 kg/m    General: Alert, tearful at times, pleasant Cardio: Normal S1 and S2, RRR  Pulm: CTAB, normal WOB Abdomen: Bowel sounds normal. Abdomen soft and non-tender.  Extremities: No peripheral edema. Warm/ well perfused.  Strong radial pulse. Neuro: Cranial nerves grossly intact  PHQ9: 5  ASSESSMENT/PLAN:   Type 2 diabetes mellitus without complication, without long-term current use of insulin (HCC) At goal. Continue Metformin 10047mBID. Gave patient option to reduce dose or stop Metformin if she is experiencing side effects when on chemotherapy. Asked patient to contact me if she has any concerns at all about this.  Int[place] to [place] and asked her whether there is anything I could do to help her during this difficult time. She said "as I long God lets me live for 2 years I'll be okay". Offered patient therapy  resources but she declined. Says she has a good support system which included all her children who live locally. She will stay with her daughter when she is having chemotherapy. I said that I would like to be there for her even if it is checking in on a monthly basis. Patient agreed. Office visit follow up arranged for next month. Pt can change this telephone visit if she is feeling tired from chemotherapy.  Explained to patient how proud I am of her to open about this very difficult news with me and for having strength and courage.     PoLattie HawMD [place]

## 2019-06-20 NOTE — Assessment & Plan Note (Addendum)
Provided support to patient and asked her whether there is anything I could do to help her during this difficult time. She said "as I long God lets me live for 2 years I'll be okay". Offered patient therapy resources but she declined. Says she has a good support system which included all her children who live locally. She will stay with her daughter when she is having chemotherapy. I said that I would like to be there for her even if it is checking in on a monthly basis. Patient agreed. Office visit follow up arranged for next month. Pt can change this telephone visit if she is feeling tired from chemotherapy.  Explained to patient how proud I am of her to open about this very difficult news with me and for having strength and courage.

## 2019-06-20 NOTE — Assessment & Plan Note (Signed)
At goal. Continue Metformin 1075m BID. Gave patient option to reduce dose or stop Metformin if she is experiencing side effects when on chemotherapy. Asked patient to contact me if she has any concerns at all about this.

## 2019-06-21 ENCOUNTER — Inpatient Hospital Stay: Payer: Medicare HMO

## 2019-06-21 ENCOUNTER — Encounter: Payer: Self-pay | Admitting: Hematology

## 2019-06-21 ENCOUNTER — Other Ambulatory Visit: Payer: Self-pay

## 2019-06-21 ENCOUNTER — Inpatient Hospital Stay (HOSPITAL_BASED_OUTPATIENT_CLINIC_OR_DEPARTMENT_OTHER): Payer: Medicare HMO | Admitting: Hematology

## 2019-06-21 VITALS — BP 155/65 | HR 66 | Temp 97.9°F | Resp 18 | Ht 62.0 in | Wt 184.5 lb

## 2019-06-21 DIAGNOSIS — E785 Hyperlipidemia, unspecified: Secondary | ICD-10-CM | POA: Diagnosis not present

## 2019-06-21 DIAGNOSIS — I10 Essential (primary) hypertension: Secondary | ICD-10-CM | POA: Diagnosis not present

## 2019-06-21 DIAGNOSIS — R634 Abnormal weight loss: Secondary | ICD-10-CM | POA: Diagnosis not present

## 2019-06-21 DIAGNOSIS — R197 Diarrhea, unspecified: Secondary | ICD-10-CM | POA: Diagnosis not present

## 2019-06-21 DIAGNOSIS — C221 Intrahepatic bile duct carcinoma: Secondary | ICD-10-CM

## 2019-06-21 DIAGNOSIS — K254 Chronic or unspecified gastric ulcer with hemorrhage: Secondary | ICD-10-CM | POA: Diagnosis not present

## 2019-06-21 DIAGNOSIS — Z5111 Encounter for antineoplastic chemotherapy: Secondary | ICD-10-CM | POA: Diagnosis not present

## 2019-06-21 DIAGNOSIS — D649 Anemia, unspecified: Secondary | ICD-10-CM | POA: Diagnosis not present

## 2019-06-21 DIAGNOSIS — D696 Thrombocytopenia, unspecified: Secondary | ICD-10-CM | POA: Diagnosis not present

## 2019-06-21 DIAGNOSIS — Z95828 Presence of other vascular implants and grafts: Secondary | ICD-10-CM

## 2019-06-21 LAB — CMP (CANCER CENTER ONLY)
ALT: 16 U/L (ref 0–44)
AST: 24 U/L (ref 15–41)
Albumin: 3.1 g/dL — ABNORMAL LOW (ref 3.5–5.0)
Alkaline Phosphatase: 105 U/L (ref 38–126)
Anion gap: 9 (ref 5–15)
BUN: 15 mg/dL (ref 8–23)
CO2: 27 mmol/L (ref 22–32)
Calcium: 9.2 mg/dL (ref 8.9–10.3)
Chloride: 103 mmol/L (ref 98–111)
Creatinine: 0.84 mg/dL (ref 0.44–1.00)
GFR, Est AFR Am: >60 mL/min (ref >60)
GFR, Estimated: >60 mL/min (ref >60)
Glucose, Bld: 208 mg/dL — ABNORMAL HIGH (ref 70–99)
Potassium: 3.9 mmol/L (ref 3.5–5.1)
Sodium: 139 mmol/L (ref 135–145)
Total Bilirubin: 0.3 mg/dL (ref 0.3–1.2)
Total Protein: 7 g/dL (ref 6.5–8.1)

## 2019-06-21 LAB — CBC WITH DIFFERENTIAL (CANCER CENTER ONLY)
Abs Immature Granulocytes: 0.01 10*3/uL (ref 0.00–0.07)
Basophils Absolute: 0.1 10*3/uL (ref 0.0–0.1)
Basophils Relative: 1 %
Eosinophils Absolute: 0.1 10*3/uL (ref 0.0–0.5)
Eosinophils Relative: 1 %
HCT: 37.4 % (ref 36.0–46.0)
Hemoglobin: 11.6 g/dL — ABNORMAL LOW (ref 12.0–15.0)
Immature Granulocytes: 0 %
Lymphocytes Relative: 24 %
Lymphs Abs: 1.7 10*3/uL (ref 0.7–4.0)
MCH: 27.2 pg (ref 26.0–34.0)
MCHC: 31 g/dL (ref 30.0–36.0)
MCV: 87.8 fL (ref 80.0–100.0)
Monocytes Absolute: 0.6 10*3/uL (ref 0.1–1.0)
Monocytes Relative: 9 %
Neutro Abs: 4.5 10*3/uL (ref 1.7–7.7)
Neutrophils Relative %: 65 %
Platelet Count: 133 10*3/uL — ABNORMAL LOW (ref 150–400)
RBC: 4.26 MIL/uL (ref 3.87–5.11)
RDW: 15.2 % (ref 11.5–15.5)
WBC Count: 6.9 10*3/uL (ref 4.0–10.5)
nRBC: 0 % (ref 0.0–0.2)

## 2019-06-21 MED ORDER — SODIUM CHLORIDE 0.9% FLUSH
10.0000 mL | INTRAVENOUS | Status: DC | PRN
  Administered 2019-06-21: 10 mL
  Filled 2019-06-21: qty 10

## 2019-06-21 MED ORDER — SODIUM CHLORIDE 0.9 % IV SOLN
400.0000 mg/m2 | Freq: Once | INTRAVENOUS | Status: AC
  Administered 2019-06-21: 768 mg via INTRAVENOUS
  Filled 2019-06-21: qty 38.4

## 2019-06-21 MED ORDER — FLUOROURACIL CHEMO INJECTION 2.5 GM/50ML
400.0000 mg/m2 | Freq: Once | INTRAVENOUS | Status: AC
  Administered 2019-06-21: 750 mg via INTRAVENOUS
  Filled 2019-06-21: qty 15

## 2019-06-21 MED ORDER — PROCHLORPERAZINE MALEATE 10 MG PO TABS
ORAL_TABLET | ORAL | Status: AC
  Filled 2019-06-21: qty 1

## 2019-06-21 MED ORDER — PROCHLORPERAZINE MALEATE 10 MG PO TABS
10.0000 mg | ORAL_TABLET | Freq: Once | ORAL | Status: AC
  Administered 2019-06-21: 10 mg via ORAL

## 2019-06-21 MED ORDER — SODIUM CHLORIDE 0.9% FLUSH
10.0000 mL | INTRAVENOUS | Status: DC | PRN
  Filled 2019-06-21: qty 10

## 2019-06-21 MED ORDER — SODIUM CHLORIDE 0.9 % IV SOLN
2400.0000 mg/m2 | INTRAVENOUS | Status: DC
  Administered 2019-06-21: 4600 mg via INTRAVENOUS
  Filled 2019-06-21: qty 92

## 2019-06-21 MED ORDER — SODIUM CHLORIDE 0.9 % IV SOLN
Freq: Once | INTRAVENOUS | Status: AC
  Filled 2019-06-21: qty 250

## 2019-06-21 NOTE — Patient Instructions (Addendum)
[facility] [facility] Discharge Instructions for Patients Receiving Chemotherapy  Today you received the following chemotherapy agents: Leucovorin/Fluorouracil (5FU)  To help prevent nausea and vomiting after your treatment, we encourage you to take your nausea medication as directed.   If you develop nausea and vomiting that is not controlled by your nausea medication, call the clinic.   BELOW ARE SYMPTOMS THAT SHOULD BE REPORTED IMMEDIATELY:  *FEVER GREATER THAN 100.5 F  *CHILLS WITH OR WITHOUT FEVER  NAUSEA AND VOMITING THAT IS NOT CONTROLLED WITH YOUR NAUSEA MEDICATION  *UNUSUAL SHORTNESS OF BREATH  *UNUSUAL BRUISING OR BLEEDING  TENDERNESS IN MOUTH AND THROAT WITH OR WITHOUT PRESENCE OF ULCERS  *URINARY PROBLEMS  *BOWEL PROBLEMS  UNUSUAL RASH Items with * indicate a potential emergency and should be followed up as soon as possible.  Feel free to call the clinic should you have any questions or concerns. The clinic phone number is [phone].  Please show the [facility] at check-in to the Emergency Department and triage nurse.  Leucovorin injection What is this medicine? LEUCOVORIN (loo koe VOR in) is used to prevent or treat the harmful effects of some medicines. This medicine is used to treat anemia caused by a low amount of folic acid in the body. It is also used with 5-fluorouracil (5-FU) to treat colon cancer. This medicine may be used for other purposes; ask your health care provider or pharmacist if you have questions. What should I tell my health care provider before I take this medicine? They need to know if you have any of these conditions:  anemia from low levels of vitamin B-12 in the blood  an unusual or allergic reaction to leucovorin, folic acid, other medicines, foods, dyes, or preservatives  pregnant or trying to get pregnant  breast-feeding How should I use this medicine? This medicine is for injection into a muscle or into a vein.  It is given by a health care professional in a hospital or clinic setting. Talk to your pediatrician regarding the use of this medicine in children. Special care may be needed. Overdosage: If you think you have taken too much of this medicine contact a poison control center or emergency room at once. NOTE: This medicine is only for you. Do not share this medicine with others. What if I miss a dose? This does not apply. What may interact with this medicine?  capecitabine  fluorouracil  phenobarbital  phenytoin  primidone  trimethoprim-sulfamethoxazole This list may not describe all possible interactions. Give your health care provider a list of all the medicines, herbs, non-prescription drugs, or dietary supplements you use. Also tell them if you smoke, drink alcohol, or use illegal drugs. Some items may interact with your medicine. What should I watch for while using this medicine? Your condition will be monitored carefully while you are receiving this medicine. This medicine may increase the side effects of 5-fluorouracil, 5-FU. Tell your doctor or health care professional if you have diarrhea or mouth sores that do not get better or that get worse. What side effects may I notice from receiving this medicine? Side effects that you should report to your doctor or health care professional as soon as possible:  allergic reactions like skin rash, itching or hives, swelling of the face, lips, or tongue  breathing problems  fever, infection  mouth sores  unusual bleeding or bruising  unusually weak or tired Side effects that usually do not require medical attention (report to your doctor or health  care professional if they continue or are bothersome):  constipation or diarrhea  loss of appetite  nausea, vomiting This list may not describe all possible side effects. Call your doctor for medical advice about side effects. You may report side effects to FDA at  1-800-FDA-1088. Where should I keep my medicine? This drug is given in a hospital or clinic and will not be stored at home. NOTE: This sheet is a summary. It may not cover all possible information. If you have questions about this medicine, talk to your doctor, pharmacist, or health care provider.  2020 Elsevier/Gold Standard (2007-09-29 16:50:29) Fluorouracil, 5-FU injection What is this medicine? FLUOROURACIL, 5-FU (flure oh YOOR a sil) is a chemotherapy drug. It slows the growth of cancer cells. This medicine is used to treat many types of cancer like breast cancer, colon or rectal cancer, pancreatic cancer, and stomach cancer. This medicine may be used for other purposes; ask your health care provider or pharmacist if you have questions. COMMON BRAND NAME(S): Adrucil What should I tell my health care provider before I take this medicine? They need to know if you have any of these conditions:  blood disorders  dihydropyrimidine dehydrogenase (DPD) deficiency  infection (especially a virus infection such as chickenpox, cold sores, or herpes)  kidney disease  liver disease  malnourished, poor nutrition  recent or ongoing radiation therapy  an unusual or allergic reaction to fluorouracil, other chemotherapy, other medicines, foods, dyes, or preservatives  pregnant or trying to get pregnant  breast-feeding How should I use this medicine? This drug is given as an infusion or injection into a vein. It is administered in a hospital or clinic by a specially trained health care professional. Talk to your pediatrician regarding the use of this medicine in children. Special care may be needed. Overdosage: If you think you have taken too much of this medicine contact a poison control center or emergency room at once. NOTE: This medicine is only for you. Do not share this medicine with others. What if I miss a dose? It is important not to miss your dose. Call your doctor or health care  professional if you are unable to keep an appointment. What may interact with this medicine?  allopurinol  cimetidine  dapsone  digoxin  hydroxyurea  leucovorin  levamisole  medicines for seizures like ethotoin, fosphenytoin, phenytoin  medicines to increase blood counts like filgrastim, pegfilgrastim, sargramostim  medicines that treat or prevent blood clots like warfarin, enoxaparin, and dalteparin  methotrexate  metronidazole  pyrimethamine  some other chemotherapy drugs like busulfan, cisplatin, estramustine, vinblastine  trimethoprim  trimetrexate  vaccines Talk to your doctor or health care professional before taking any of these medicines:  acetaminophen  aspirin  ibuprofen  ketoprofen  naproxen This list may not describe all possible interactions. Give your health care provider a list of all the medicines, herbs, non-prescription drugs, or dietary supplements you use. Also tell them if you smoke, drink alcohol, or use illegal drugs. Some items may interact with your medicine. What should I watch for while using this medicine? Visit your doctor for checks on your progress. This drug may make you feel generally unwell. This is not uncommon, as chemotherapy can affect healthy cells as well as cancer cells. Report any side effects. Continue your course of treatment even though you feel ill unless your doctor tells you to stop. In some cases, you may be given additional medicines to help with side effects. Follow all directions for their use.  Call your doctor or health care professional for advice if you get a fever, chills or sore throat, or other symptoms of a cold or flu. Do not treat yourself. This drug decreases your body's ability to fight infections. Try to avoid being around people who are sick. This medicine may increase your risk to bruise or bleed. Call your doctor or health care professional if you notice any unusual bleeding. Be careful brushing  and flossing your teeth or using a toothpick because you may get an infection or bleed more easily. If you have any dental work done, tell your dentist you are receiving this medicine. Avoid taking products that contain aspirin, acetaminophen, ibuprofen, naproxen, or ketoprofen unless instructed by your doctor. These medicines may hide a fever. Do not become pregnant while taking this medicine. Women should inform their doctor if they wish to become pregnant or think they might be pregnant. There is a potential for serious side effects to an unborn child. Talk to your health care professional or pharmacist for more information. Do not breast-feed an infant while taking this medicine. Men should inform their doctor if they wish to father a child. This medicine may lower sperm counts. Do not treat diarrhea with over the counter products. Contact your doctor if you have diarrhea that lasts more than 2 days or if it is severe and watery. This medicine can make you more sensitive to the sun. Keep out of the sun. If you cannot avoid being in the sun, wear protective clothing and use sunscreen. Do not use sun lamps or tanning beds/booths. What side effects may I notice from receiving this medicine? Side effects that you should report to your doctor or health care professional as soon as possible:  allergic reactions like skin rash, itching or hives, swelling of the face, lips, or tongue  low blood counts - this medicine may decrease the number of white blood cells, red blood cells and platelets. You may be at increased risk for infections and bleeding.  signs of infection - fever or chills, cough, sore throat, pain or difficulty passing urine  signs of decreased platelets or bleeding - bruising, pinpoint red spots on the skin, black, tarry stools, blood in the urine  signs of decreased red blood cells - unusually weak or tired, fainting spells, lightheadedness  breathing problems  changes in  vision  chest pain  mouth sores  nausea and vomiting  pain, swelling, redness at site where injected  pain, tingling, numbness in the hands or feet  redness, swelling, or sores on hands or feet  stomach pain  unusual bleeding Side effects that usually do not require medical attention (report to your doctor or health care professional if they continue or are bothersome):  changes in finger or toe nails  diarrhea  dry or itchy skin  hair loss  headache  loss of appetite  sensitivity of eyes to the light  stomach upset  unusually teary eyes This list may not describe all possible side effects. Call your doctor for medical advice about side effects. You may report side effects to FDA at 1-800-FDA-1088. Where should I keep my medicine? This drug is given in a hospital or clinic and will not be stored at home. NOTE: This sheet is a summary. It may not cover all possible information. If you have questions about this medicine, talk to your doctor, pharmacist, or health care provider.  2020 Elsevier/Gold Standard (2007-07-29 13:53:16)

## 2019-06-22 ENCOUNTER — Telehealth: Payer: Self-pay | Admitting: Hematology

## 2019-06-22 ENCOUNTER — Telehealth: Payer: Self-pay | Admitting: *Deleted

## 2019-06-22 LAB — CANCER ANTIGEN 19-9: CA 19-9: 10 U/mL (ref 0–35)

## 2019-06-22 NOTE — Telephone Encounter (Signed)
Called pt to see how she is doing with her chemotherapy & she reports sleeping well & no n/v or diarrhea/constipation.  She states she feels comfortable with the pump & denies any questions/concerns.  She reports knowing how to reach [place] if she needs [place].

## 2019-06-22 NOTE — Telephone Encounter (Signed)
Scheduled appt per 3/15 los.  Spoke with pt and she is aware of the appt date and time.  Patient stated she will get a print out tomorrow (3/17) at her pump appt.

## 2019-06-22 NOTE — Telephone Encounter (Signed)
-----   Message from Wylene Men, RN sent at 06/21/2019 11:30 AM EDT ----- Regarding: FENG 1ST TIME LEUCO/5FU Patient received 1st time Leucovorin/5FU. Patient tolerated well. No s/s or c/o discomfort or distress.

## 2019-06-23 ENCOUNTER — Inpatient Hospital Stay: Payer: Medicare HMO

## 2019-06-23 ENCOUNTER — Other Ambulatory Visit: Payer: Self-pay

## 2019-06-23 VITALS — BP 147/58 | HR 70 | Temp 98.0°F | Resp 18

## 2019-06-23 DIAGNOSIS — C221 Intrahepatic bile duct carcinoma: Secondary | ICD-10-CM

## 2019-06-23 DIAGNOSIS — R197 Diarrhea, unspecified: Secondary | ICD-10-CM | POA: Diagnosis not present

## 2019-06-23 DIAGNOSIS — K254 Chronic or unspecified gastric ulcer with hemorrhage: Secondary | ICD-10-CM | POA: Diagnosis not present

## 2019-06-23 DIAGNOSIS — D649 Anemia, unspecified: Secondary | ICD-10-CM | POA: Diagnosis not present

## 2019-06-23 DIAGNOSIS — D696 Thrombocytopenia, unspecified: Secondary | ICD-10-CM | POA: Diagnosis not present

## 2019-06-23 DIAGNOSIS — I10 Essential (primary) hypertension: Secondary | ICD-10-CM | POA: Diagnosis not present

## 2019-06-23 DIAGNOSIS — Z5111 Encounter for antineoplastic chemotherapy: Secondary | ICD-10-CM | POA: Diagnosis not present

## 2019-06-23 DIAGNOSIS — R634 Abnormal weight loss: Secondary | ICD-10-CM | POA: Diagnosis not present

## 2019-06-23 DIAGNOSIS — E785 Hyperlipidemia, unspecified: Secondary | ICD-10-CM | POA: Diagnosis not present

## 2019-06-23 MED ORDER — SODIUM CHLORIDE 0.9% FLUSH
10.0000 mL | INTRAVENOUS | Status: DC | PRN
  Administered 2019-06-23: 10 mL
  Filled 2019-06-23: qty 10

## 2019-06-23 MED ORDER — HEPARIN SOD (PORK) LOCK FLUSH 100 UNIT/ML IV SOLN
500.0000 [IU] | Freq: Once | INTRAVENOUS | Status: AC | PRN
  Administered 2019-06-23: 500 [IU]
  Filled 2019-06-23: qty 5

## 2019-06-25 ENCOUNTER — Other Ambulatory Visit: Payer: Self-pay | Admitting: *Deleted

## 2019-06-25 DIAGNOSIS — K219 Gastro-esophageal reflux disease without esophagitis: Secondary | ICD-10-CM

## 2019-06-26 MED ORDER — FAMOTIDINE 20 MG PO TABS: 20.0000 mg | ORAL_TABLET | Freq: Two times a day (BID) | ORAL | 3 refills | Status: DC

## 2019-06-30 NOTE — Progress Notes (Signed)
Pharmacist Chemotherapy Monitoring - Follow Up Assessment    I verify that I have reviewed each item in the below checklist:  . Regimen for the patient is scheduled for the appropriate day and plan matches scheduled date. [name] Appropriate non-routine labs are ordered dependent on drug ordered. . If applicable, additional medications reviewed and ordered per protocol based on lifetime cumulative doses and/or treatment regimen.   Plan for follow-up and/or issues identified: No . I-vent associated with next due treatment: No . MD and/or nursing notified: No  Bonnie Peterson D 06/30/2019 11:30 AM

## 2019-07-04 NOTE — Progress Notes (Signed)
[facility]   [phone] [phone]   Clinic Follow up Note   Patient Care Team: Lattie Haw, MD as PCP - General Melan[place], York Ram, MD (Inactive) as Resident (Family Medicine) Jonnie Finner, RN as Oncology Nurse Navigator Jonnie Finner, RN as Oncology Nurse Navigator 07/06/2019  CHIEF COMPLAINT: F/u metastatic intrahepatic cholangiocarcinoma   SUMMARY OF ONCOLOGIC HISTORY: Oncology History  Intrahepatic cholangiocarcinoma ([place])  2014 Initial Diagnosis   Intrahepatic cholangiocarcinoma ([place])   2014 Initial Diagnosis   Per notes from [facility] by Dr. Mettu 1. 2014 Mass in the left side of the liver incidentally found during work up for acute pancreatitis.  The mass was thought to be a bile duct lesion and was followed up.  2. 02/2013 MRI hyperdense focus within the left hepatic lobe.  3. 02/21/13 CT scan 3.7 x 2.1 x 2.4 cm arterial enhancing lesion.  AFP and CEA were normal.  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 4. 05/18/12 Biopsy of something (surg path in Care Everywhere, but no report) 5. 07/02/13 Biopsy of something (surg path in Care Everywhere, but no report) 6. 09/02/13 MRI lesion in segment 4A with adjacent perfusional enhancement with some washout. Lesion is close to left portal vein. 7. 09/15/14 MRI segment 4A lesion slightly increased in size, minimal fat containing, peripheral enhancement and arterial enhancement. Left portal vein is clearly not involved.   02/19/2015 Surgery   Per notes from [facility] by Dr. Dennison Nancy L lobe of liver partial hepatectomy segments 2, 3, 4a, 4b invasive cholangiocarcinoma, moderately differentiated. Tumor focally extends to the parenchymal margin of resection. Uninvolved liver parenchyma with steatohepatitis and associated periportal and centrilobular pericellular fibrosis. Scattered von Meyenburg complexes. One hepatic artery LN with metastatic cholangiocarcinoma. Fibroadipose  tissue and nerve negative for malignancy. Gallbladder negative for malignancy. One LN negative for malignancy. T2aN1 invasive cholangiocarcinoma, moderately differentiated, with positive hepatic parenchymal margin, and 1/2 nodes positive for tumor involvement. -Post operative course was complicated by fevers felt to be related to atelectasis   04/17/2015 Concurrent Chemotherapy   Per notes from [facility] by Dr. Dennison Nancy -04/17/15-05/26/15 Chemoradiation with capecitabine (locally) 50.4 Gy in 28 fractions.  -Patient started getting sick, hospitalized, fluids twice, was on ondansetron, and propranolol, and sucralfate  -EGD found ulcers in stomach  -06/14/15 CTA (per Dr. Shayne Alken note) no evidence of disease recurrence.  Formation of the stomach and duodenum was seen.   08/2015 - 2017 Chemotherapy   08/2015 4 cycles of gemcitabine.  Patient intolerant to this treatment with weight loss, anorexia, and fatigue.    10/16/2015 Surgery   Per notes from [facility] by Dr. Dennison Nancy -CTA chest abdomen and pelvis status post left hepatectomy with no evidence of recurrent disease or metastatic disease.  Right lower lobe pulmonary nodules unchanged.  CA 19-9 7   08/11/2017 Pathology Results   Per notes from [place] by Dr. Dennison Nancy -07/29/17 CT abd -U/S biopsy of liver    08/27/2017 Imaging   Per note from [facility] by Dr. Dennison Nancy --08/25/17 CT chest, abdomen, and pelvis ill-defined hypoenhancing lesion in the inferior right liver concerning for m metastatic disease. Portocaval node measuring up to 1.2 cm in short axis   08/28/2017 Pathology Results   Per note from [facility] by Dr. Dennison Nancy -Invasive moderate [place]ward differentiated adenocarcinoma morphologically compatible with cholangio- -09/02/17 MDC consensus appears consistent with intrahepatic cholangiocarcinoma and surgical resection not appropriate.  If after systemic therapy she remains stable, no extrahepatic disease, could consider HAI  pump in future.  Ablation not an option.   09/24/2017 -  10/15/2017 Radiation Therapy   Per note from [facility] by Dr. Dennison Nancy -XRT x3 weeks   12/30/2017 Imaging   Per note from [facility] by Dr. Dennison Nancy CT chest, abdomen, and pelvis slight interval increase in size of metastatic lesion in the liver with increasing necrosis.  Stable right lower lobe pulmonary nodule and portacaval lymph nodes   10/14/2018 Imaging   Per note from [facility] by Dr. Dennison Nancy CT chest, abdomen, and pelvis decrease size of liver lesions, PVT new, 1 to centimeter lymph node   05/14/2019 Imaging   Per note from [facility] by Dr. Dennison Nancy -CT infiltrative mass within the porta hepatis encasing the portal triads measuring up to 4.1 cm, concerning for residual/recurrent cholangiocarcinoma.  There is worsening thrombosis/tumor in the vein involving the entire portal venous system now extending into the right portal vein and distal aspect of the splenic vein.  There are at least 6 new liver lesions concerning for multifocal hepatic metastases.  Stable retroperitoneal lymphadenopathy.  Similar appearing distal gastric wall thickening, likely post radiation changes.  Stable subcentimeter pulmonary nodules, the largest within the right lower lobe measuring 0.8 cm.  Recommend continued attention to follow-up.   05/14/2019 Cancer Staging   Staging form: Intrahepatic Bile Duct, AJCC 8th Edition - Clinical stage from 05/14/2019: Stage IV (cTX, cNX, cM1) - Signed by Truitt Merle, MD on 06/20/2019   06/21/2019 -  Chemotherapy   First-line 5-fluorouracil and leucovorin IV every 2 weeks starting 06/21/19     CURRENT THERAPY: First-line 5-fluorouracil and leucovorin IV every 2 weeks starting 06/21/19  INTERVAL HISTORY: Ms. Koerner returns for f/u and treatment as scheduled. She completed cycle 1 leucovorin/5FU on 06/21/19. She has low energy and appetite for 2 days after treatment. She is interested in food but "body tells me what I can eat." She has occasionally runny stools up to 3 per day. Denies n/v. Denies abdominal pain. She feels  anxious. Her left arm "broke out" last week from nerves. She denies panic attacks. She felt this way when her daughter died in 06/07/2016. She has been on anti-anxiety meds in the past and is requesting more now. Denies depression. No recent fever, chills, cough, chest pain, dyspnea, leg swelling, bleeding, mucositis, neuropathy.     MEDICAL HISTORY:  Past Medical History:  Diagnosis Date  . Anxiety   . Benign positional vertigo   . Cancer ([place]) 02/09/15   intrahepatic cholangiocarcinoma  . COPD (chronic obstructive pulmonary disease) ([place])   . Depression   . Diabetes mellitus without complication ([place])   . Early cataracts, bilateral 10/13   Optho, Dr Gershon Crane  . Echocardiogram findings abnormal, without diagnosis 10/10   10/10: mild pulm HTN, EF 60-65%, mild LVH, moderate aortic regurg  . GERD (gastroesophageal reflux disease)    I have "acid reflux"  . Gout   . HLD (hyperlipidemia)   . HTN, goal below 130/80   . Irritable bowel syndrome   . Obesity, Class III, BMI 40-49.9 (morbid obesity) ([place])   . Obstructive sleep apnea    wears cpap  . Osteoarthritis (arthritis due to wear and tear of joints)    also gout  . Restrictive lung disease     SURGICAL HISTORY: Past Surgical History:  Procedure Laterality Date  . ABDOMINAL HYSTERECTOMY    . ESOPHAGOGASTRODUODENOSCOPY N/A 06/15/2015   Procedure: ESOPHAGOGASTRODUODENOSCOPY (EGD);  Surgeon: Gatha Mayer, MD;  Location: [facility];  Service: Endoscopy;  Laterality: N/A;  .  IR IMAGING GUIDED PORT INSERTION  06/15/2019  . IR REMOVAL TUN ACCESS W/ PORT W/O FL MOD SED  11/11/2016  . LIVER BIOPSY    . OPEN PARTIAL HEPATECTOMY  Left 02/09/15  . PARTIAL HYSTERECTOMY    . ROTATOR CUFF REPAIR       L rotator cuff repair 11/07-[facility] - 12/7/200  . TONSILLECTOMY      I have reviewed the social history and family history with the patient and they are unchanged from previous note.  ALLERGIES:  is allergic to other.  MEDICATIONS:  Current  Outpatient Medications  Medication Sig Dispense Refill  . albuterol (PROAIR HFA) 108 (90 Base) MCG/ACT inhaler INHALE 2 PUFFS INTO THE LUNGS EVERY 4 HOURS AS NEEDED FOR WHEEZING OR SHORTNESS OF BREATH 1 Inhaler 0  . alendronate (FOSAMAX) 70 MG tablet TAKE 1 TABLET  ONCE A WEEK. 12 tablet 2  . amLODipine (NORVASC) 10 MG tablet TAKE 1 TABLET EVERY DAY 90 tablet 1  . atenolol (TENORMIN) 25 MG tablet Take 1 tablet (25 mg total) by mouth daily. 90 tablet 3  . cholecalciferol (VITAMIN D) 1000 units tablet Take 1 tablet (1,000 Units total) by mouth daily. 30 tablet 11  . diclofenac sodium (VOLTAREN) 1 % GEL Apply 2 g topically 4 (four) times daily as needed. 100 g 4  . famotidine (PEPCID) 20 MG tablet Take 1 tablet (20 mg total) by mouth 2 (two) times daily. 180 tablet 3  . fish oil-omega-3 fatty acids 1000 MG capsule Take 1 g by mouth every 30 (thirty) days.     . fluticasone (FLONASE) 50 MCG/ACT nasal spray Place 2 sprays into both nostrils daily as needed for rhinitis. 16 g 2  . hydrochlorothiazide (HYDRODIURIL) 25 MG tablet Take 1 tablet (25 mg total) by mouth daily. 90 tablet 3  . Hyprom-Naphaz-Polysorb-Zn Sulf (CLEAR EYES COMPLETE OP) Place 1 drop into both eyes daily as needed (dry eyes).     [name] lidocaine-prilocaine (EMLA) cream Apply 1 application topically every 14 (fourteen) days. Every 14 days as needed 30 g 2  . lisinopril (ZESTRIL) 40 MG tablet Take 1 tablet (40 mg total) by mouth daily. 90 tablet 3  . metFORMIN (GLUCOPHAGE) 500 MG tablet TAKE 2 TABLETS TWICE DAILY WITH MEALS 360 tablet 3  . Multiple Vitamin (MULTIVITAMIN WITH MINERALS) TABS tablet Take 1 tablet by mouth daily.    [name] VITAMIN E PO Take 1 tablet by mouth daily.     [name] allopurinol (ZYLOPRIM) 100 MG tablet Take 1 tablet (100 mg total) by mouth daily. (Patient not taking: Reported on 07/06/2019) 90 tablet 2  . loratadine (CLARITIN) 10 MG tablet Take 1 tablet (10 mg total) by mouth daily as needed for allergies. 30 tablet 3  .  LORazepam (ATIVAN) 0.5 MG tablet Take 0.5-1 tablets (0.25-0.5 mg total) by mouth at bedtime as needed for anxiety. 20 tablet 0  . ondansetron (ZOFRAN) 8 MG tablet Take 1 tablet (8 mg total) by mouth 2 (two) times daily as needed for refractory nausea / vomiting. Start on day 3 after chemotherapy. (Patient not taking: Reported on 07/06/2019) 30 tablet 2  . prochlorperazine (COMPAZINE) 10 MG tablet Take 1 tablet (10 mg total) by mouth every 6 (six) hours as needed (Nausea or vomiting). (Patient not taking: Reported on 07/06/2019) 30 tablet 1  . Spacer/Aero-Holding Chambers (AEROCHAMBER MV) inhaler Use as instructed (Patient not taking: Reported on 07/06/2019) 1 each 0   No current facility-administered medications for this visit.   Facility-Administered Medications  Ordered in Other Visits  Medication Dose Route Frequency Provider Last Rate Last Admin  . fluorouracil (ADRUCIL) 4,600 mg in sodium chloride 0.9 % 58 mL chemo infusion  2,400 mg/m2 (Treatment Plan Recorded) Intravenous 1 day or 1 dose Truitt Merle, MD   4,600 mg at 07/06/19 1405  . sodium chloride flush (NS) 0.9 % injection 10 mL  10 mL Intracatheter PRN Truitt Merle, MD   10 mL at 07/06/19 1402    PHYSICAL EXAMINATION: ECOG PERFORMANCE STATUS: 1 - Symptomatic but completely ambulatory  Vitals:   07/06/19 1119  BP: (!) 161/54  Pulse: 60  Resp: 18  Temp: 99.1 F (37.3 C)  SpO2: 100%   Filed Weights   07/06/19 1119  Weight: 181 lb 4.8 oz (82.2 kg)    GENERAL:alert, no distress and comfortable SKIN: no rash  EYES: sclera clear LUNGS: clear normal breathing effort HEART: regular rate & rhythm, no lower extremity edema ABDOMEN: abdomen soft, non-tender and normal bowel sounds. There is firmness in the RUQ near previous surgical scar  NEURO: alert & oriented x 3 with fluent speech PAC without erythema   LABORATORY DATA:  I have reviewed the data as listed CBC Latest Ref Rng & Units 07/06/2019 06/21/2019 06/15/2019  WBC 4.0 - 10.5 K/uL  4.9 6.9 8.2  Hemoglobin 12.0 - 15.0 g/dL 10.9(L) 11.6(L) 12.9  Hematocrit 36.0 - 46.0 % 34.6(L) 37.4 42.0  Platelets 150 - 400 K/uL 103(L) 133(L) 169     CMP Latest Ref Rng & Units 07/06/2019 06/21/2019 06/11/2019  Glucose 70 - 99 mg/dL 131(H) 208(H) 146(H)  BUN 8 - 23 mg/dL 11 15 10   Creatinine 0.44 - 1.00 mg/dL 0.80 0.84 0.79  Sodium 135 - 145 mmol/L 141 139 138  Potassium 3.5 - 5.1 mmol/L 3.9 3.9 4.4  Chloride 98 - 111 mmol/L 107 103 102  CO2 22 - 32 mmol/L 25 27 28   Calcium 8.9 - 10.3 mg/dL 8.9 9.2 9.5  Total Protein 6.5 - 8.1 g/dL 6.9 7.0 7.6  Total Bilirubin 0.3 - 1.2 mg/dL 0.4 0.3 0.5  Alkaline Phos 38 - 126 U/L 102 105 131(H)  AST 15 - 41 U/L 24 24 31   ALT 0 - 44 U/L 16 16 23       RADIOGRAPHIC STUDIES: I have personally reviewed the radiological images as listed and agreed with the findings in the report. No results found.   ASSESSMENT & PLAN: ANGELEEN HORNEY is a 74 y.o. female with COPD, Depression, DM, Gout, HLD, HTN, Osteoarthritis, sleep apnea.    1. 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 She was treated with adjuvant concurrent ChemoRT with Xeloda for about 6 weeks.  -Based on 08/2017 CT scan and Liver biopsy at [facility] she had cancer recurrence with metastasis. She was then treated with Radiation for about 3 weeks  -Her 05/14/19 CT from [facility] shows infiltrative mass within the porta hepatis encasing the portal triads measuring up to 4.1 cm, concerning for residual/recurrent cholangiocarcinoma. She is stage IV and her cancer is no longer curable at this point but still treatable.  -Given poor tolerance to more intensive treatment in the past, she was started on first-line 5-fluorouracil and leucovorin IV every 2 weeks starting 06/21/19. Goal of therapy is palliative, to control her disease and  prolong her life. -Plan to scan her after cycle 5-6 chemo to  monitor response.  -s/p cycle 1  2. DM, HTN -monitoring on chemo  3. Mild anemia and thrombocytopenia -worsened after cycle 1, denies bleeding  4. Goal of care discussion  -The patient understands the goal of care is palliative. -she is full code now   Disposition:  Ms. Grealish appears stable. She completed 1 cycle of 5FU/leucovorin. She tolerated treatment well with mild fatigue and low appetite for couple days. She recovered well. CBC and CMP reviewed. She has worsened thrombocytopenia, PLT 103K, no bleeding. Will observe carefully. CA 19-9 from today is pending. Will proceed with cycle 2 5FU/leuc without dosage adjustment today.   For anxiety, she was given a prescription for low dose ativan PRN. I advised her not to drive after taking this.   She will return for f/u and cycle 3 in 2 weeks.   All questions were answered. The patient knows to call the clinic with any problems, questions or concerns. No barriers to learning were detected.     Alla Feeling, NP 07/06/19

## 2019-07-06 ENCOUNTER — Inpatient Hospital Stay: Payer: Medicare HMO | Admitting: Nurse Practitioner

## 2019-07-06 ENCOUNTER — Other Ambulatory Visit: Payer: Self-pay

## 2019-07-06 ENCOUNTER — Encounter: Payer: Self-pay | Admitting: Nurse Practitioner

## 2019-07-06 ENCOUNTER — Inpatient Hospital Stay: Payer: Medicare HMO

## 2019-07-06 VITALS — BP 161/54 | HR 60 | Temp 99.1°F | Resp 18 | Ht 62.0 in | Wt 181.3 lb

## 2019-07-06 DIAGNOSIS — Z5111 Encounter for antineoplastic chemotherapy: Secondary | ICD-10-CM | POA: Diagnosis not present

## 2019-07-06 DIAGNOSIS — R634 Abnormal weight loss: Secondary | ICD-10-CM | POA: Diagnosis not present

## 2019-07-06 DIAGNOSIS — D696 Thrombocytopenia, unspecified: Secondary | ICD-10-CM | POA: Diagnosis not present

## 2019-07-06 DIAGNOSIS — I10 Essential (primary) hypertension: Secondary | ICD-10-CM | POA: Diagnosis not present

## 2019-07-06 DIAGNOSIS — C221 Intrahepatic bile duct carcinoma: Secondary | ICD-10-CM

## 2019-07-06 DIAGNOSIS — R197 Diarrhea, unspecified: Secondary | ICD-10-CM | POA: Diagnosis not present

## 2019-07-06 DIAGNOSIS — K254 Chronic or unspecified gastric ulcer with hemorrhage: Secondary | ICD-10-CM | POA: Diagnosis not present

## 2019-07-06 DIAGNOSIS — E785 Hyperlipidemia, unspecified: Secondary | ICD-10-CM | POA: Diagnosis not present

## 2019-07-06 DIAGNOSIS — Z95828 Presence of other vascular implants and grafts: Secondary | ICD-10-CM

## 2019-07-06 DIAGNOSIS — D649 Anemia, unspecified: Secondary | ICD-10-CM | POA: Diagnosis not present

## 2019-07-06 LAB — CMP (CANCER CENTER ONLY)
ALT: 16 U/L (ref 0–44)
AST: 24 U/L (ref 15–41)
Albumin: 3 g/dL — ABNORMAL LOW (ref 3.5–5.0)
Alkaline Phosphatase: 102 U/L (ref 38–126)
Anion gap: 9 (ref 5–15)
BUN: 11 mg/dL (ref 8–23)
CO2: 25 mmol/L (ref 22–32)
Calcium: 8.9 mg/dL (ref 8.9–10.3)
Chloride: 107 mmol/L (ref 98–111)
Creatinine: 0.8 mg/dL (ref 0.44–1.00)
GFR, Est AFR Am: >60 mL/min (ref >60)
GFR, Estimated: >60 mL/min (ref >60)
Glucose, Bld: 131 mg/dL — ABNORMAL HIGH (ref 70–99)
Potassium: 3.9 mmol/L (ref 3.5–5.1)
Sodium: 141 mmol/L (ref 135–145)
Total Bilirubin: 0.4 mg/dL (ref 0.3–1.2)
Total Protein: 6.9 g/dL (ref 6.5–8.1)

## 2019-07-06 LAB — CBC WITH DIFFERENTIAL (CANCER CENTER ONLY)
Abs Immature Granulocytes: 0 10*3/uL (ref 0.00–0.07)
Basophils Absolute: 0 10*3/uL (ref 0.0–0.1)
Basophils Relative: 0 %
Eosinophils Absolute: 0.1 10*3/uL (ref 0.0–0.5)
Eosinophils Relative: 2 %
HCT: 34.6 % — ABNORMAL LOW (ref 36.0–46.0)
Hemoglobin: 10.9 g/dL — ABNORMAL LOW (ref 12.0–15.0)
Immature Granulocytes: 0 %
Lymphocytes Relative: 30 %
Lymphs Abs: 1.5 10*3/uL (ref 0.7–4.0)
MCH: 27.9 pg (ref 26.0–34.0)
MCHC: 31.5 g/dL (ref 30.0–36.0)
MCV: 88.7 fL (ref 80.0–100.0)
Monocytes Absolute: 0.4 10*3/uL (ref 0.1–1.0)
Monocytes Relative: 9 %
Neutro Abs: 2.9 10*3/uL (ref 1.7–7.7)
Neutrophils Relative %: 59 %
Platelet Count: 103 10*3/uL — ABNORMAL LOW (ref 150–400)
RBC: 3.9 MIL/uL (ref 3.87–5.11)
RDW: 15.9 % — ABNORMAL HIGH (ref 11.5–15.5)
WBC Count: 4.9 10*3/uL (ref 4.0–10.5)
nRBC: 0 % (ref 0.0–0.2)

## 2019-07-06 MED ORDER — LORAZEPAM 0.5 MG PO TABS: 0.2500 mg | ORAL_TABLET | Freq: Every evening | ORAL | 0 refills | Status: DC | PRN

## 2019-07-06 MED ORDER — SODIUM CHLORIDE 0.9 % IV SOLN
400.0000 mg/m2 | Freq: Once | INTRAVENOUS | Status: AC
  Administered 2019-07-06: 768 mg via INTRAVENOUS
  Filled 2019-07-06: qty 38.4

## 2019-07-06 MED ORDER — PROCHLORPERAZINE MALEATE 10 MG PO TABS
ORAL_TABLET | ORAL | Status: AC
  Filled 2019-07-06: qty 1

## 2019-07-06 MED ORDER — FLUOROURACIL CHEMO INJECTION 2.5 GM/50ML
400.0000 mg/m2 | Freq: Once | INTRAVENOUS | Status: AC
  Administered 2019-07-06: 750 mg via INTRAVENOUS
  Filled 2019-07-06: qty 15

## 2019-07-06 MED ORDER — PROCHLORPERAZINE MALEATE 10 MG PO TABS
10.0000 mg | ORAL_TABLET | Freq: Once | ORAL | Status: AC
  Administered 2019-07-06: 10 mg via ORAL

## 2019-07-06 MED ORDER — SODIUM CHLORIDE 0.9% FLUSH
10.0000 mL | INTRAVENOUS | Status: DC | PRN
  Administered 2019-07-06: 10 mL
  Filled 2019-07-06: qty 10

## 2019-07-06 MED ORDER — SODIUM CHLORIDE 0.9 % IV SOLN
2400.0000 mg/m2 | INTRAVENOUS | Status: DC
  Administered 2019-07-06: 4600 mg via INTRAVENOUS
  Filled 2019-07-06: qty 92

## 2019-07-06 MED ORDER — SODIUM CHLORIDE 0.9 % IV SOLN
Freq: Once | INTRAVENOUS | Status: AC
  Filled 2019-07-06: qty 250

## 2019-07-06 MED ORDER — SODIUM CHLORIDE 0.9% FLUSH
10.0000 mL | INTRAVENOUS | Status: DC | PRN
  Filled 2019-07-06: qty 10

## 2019-07-06 MED FILL — LORazepam 0.5 MG TABS: 0.5 | 20 days supply | Qty: 20 | Fill #0

## 2019-07-06 NOTE — Patient Instructions (Signed)
[facility] Discharge Instructions for Patients Receiving Chemotherapy  Today you received the following chemotherapy agents: Leucovorin/Fluorouracil (5FU)  To help prevent nausea and vomiting after your treatment, we encourage you to take your nausea medication as directed.   If you develop nausea and vomiting that is not controlled by your nausea medication, call the clinic.   BELOW ARE SYMPTOMS THAT SHOULD BE REPORTED IMMEDIATELY:  *FEVER GREATER THAN 100.5 F  *CHILLS WITH OR WITHOUT FEVER  NAUSEA AND VOMITING THAT IS NOT CONTROLLED WITH YOUR NAUSEA MEDICATION  *UNUSUAL SHORTNESS OF BREATH  *UNUSUAL BRUISING OR BLEEDING  TENDERNESS IN MOUTH AND THROAT WITH OR WITHOUT PRESENCE OF ULCERS  *URINARY PROBLEMS  *BOWEL PROBLEMS  UNUSUAL RASH Items with * indicate a potential emergency and should be followed up as soon as possible.  Feel free to call the clinic should you have any questions or concerns. The clinic phone number is [phone].  Please show the [facility] at check-in to the Emergency Department and triage nurse.

## 2019-07-07 ENCOUNTER — Other Ambulatory Visit: Payer: Self-pay | Admitting: Hematology

## 2019-07-07 ENCOUNTER — Telehealth: Payer: Self-pay | Admitting: Nurse Practitioner

## 2019-07-07 DIAGNOSIS — C221 Intrahepatic bile duct carcinoma: Secondary | ICD-10-CM

## 2019-07-07 LAB — CANCER ANTIGEN 19-9: CA 19-9: 10 U/mL (ref 0–35)

## 2019-07-07 NOTE — Telephone Encounter (Signed)
Scheduled appt per 3/30 los.

## 2019-07-08 ENCOUNTER — Other Ambulatory Visit: Payer: Self-pay

## 2019-07-08 ENCOUNTER — Inpatient Hospital Stay: Payer: Medicare HMO | Attending: Hematology

## 2019-07-08 VITALS — BP 154/51 | HR 77 | Temp 98.9°F

## 2019-07-08 DIAGNOSIS — D6481 Anemia due to antineoplastic chemotherapy: Secondary | ICD-10-CM | POA: Insufficient documentation

## 2019-07-08 DIAGNOSIS — T451X5A Adverse effect of antineoplastic and immunosuppressive drugs, initial encounter: Secondary | ICD-10-CM | POA: Insufficient documentation

## 2019-07-08 DIAGNOSIS — E119 Type 2 diabetes mellitus without complications: Secondary | ICD-10-CM | POA: Insufficient documentation

## 2019-07-08 DIAGNOSIS — K219 Gastro-esophageal reflux disease without esophagitis: Secondary | ICD-10-CM | POA: Insufficient documentation

## 2019-07-08 DIAGNOSIS — R634 Abnormal weight loss: Secondary | ICD-10-CM | POA: Insufficient documentation

## 2019-07-08 DIAGNOSIS — Z79899 Other long term (current) drug therapy: Secondary | ICD-10-CM | POA: Diagnosis not present

## 2019-07-08 DIAGNOSIS — D6959 Other secondary thrombocytopenia: Secondary | ICD-10-CM | POA: Diagnosis not present

## 2019-07-08 DIAGNOSIS — C787 Secondary malignant neoplasm of liver and intrahepatic bile duct: Secondary | ICD-10-CM | POA: Diagnosis not present

## 2019-07-08 DIAGNOSIS — K580 Irritable bowel syndrome with diarrhea: Secondary | ICD-10-CM | POA: Diagnosis not present

## 2019-07-08 DIAGNOSIS — S0083XA Contusion of other part of head, initial encounter: Secondary | ICD-10-CM | POA: Insufficient documentation

## 2019-07-08 DIAGNOSIS — I10 Essential (primary) hypertension: Secondary | ICD-10-CM | POA: Diagnosis not present

## 2019-07-08 DIAGNOSIS — Z5111 Encounter for antineoplastic chemotherapy: Secondary | ICD-10-CM | POA: Diagnosis not present

## 2019-07-08 DIAGNOSIS — R630 Anorexia: Secondary | ICD-10-CM | POA: Diagnosis not present

## 2019-07-08 DIAGNOSIS — C221 Intrahepatic bile duct carcinoma: Secondary | ICD-10-CM | POA: Diagnosis not present

## 2019-07-08 DIAGNOSIS — Z923 Personal history of irradiation: Secondary | ICD-10-CM | POA: Diagnosis not present

## 2019-07-08 MED ORDER — HEPARIN SOD (PORK) LOCK FLUSH 100 UNIT/ML IV SOLN
500.0000 [IU] | Freq: Once | INTRAVENOUS | Status: AC | PRN
  Administered 2019-07-08: 500 [IU]
  Filled 2019-07-08: qty 5

## 2019-07-08 MED ORDER — SODIUM CHLORIDE 0.9% FLUSH
10.0000 mL | INTRAVENOUS | Status: DC | PRN
  Administered 2019-07-08: 10 mL
  Filled 2019-07-08: qty 10

## 2019-07-08 NOTE — Patient Instructions (Signed)
Tunneled Central Venous Catheter Flushing Guide  It is important to flush your tunneled central venous catheter each time you use it, both before and after you use it. Flushing your catheter will help prevent it from clogging. What are the risks? Risks may include:  Infection.  Air getting into the catheter and bloodstream. Supplies needed:  A clean pair of gloves.  A disinfecting wipe. Use an alcohol wipe, chlorhexidine wipe, or iodine wipe as told by your health care provider.  A 10 mL syringe that has been prefilled with saline solution.  An empty 10 mL syringe, if a substance called heparin was injected into your catheter. How to flush your catheter When you flush your catheter, make sure you follow any specific instructions from your health care provider or the manufacturer. These are general guidelines. Flushing your catheter before use If there is heparin in your catheter: 1. Wash your hands with soap and water. 2. Put on gloves. 3. Scrub the injection cap for a minimum of 15 seconds with a disinfecting wipe. 4. Unclamp the catheter. 5. Attach the empty syringe to the injection cap. 6. Pull the syringe plunger back and withdraw 10 mL of blood. 7. Place the syringe into an appropriate waste container. 8. Scrub the injection cap for 15 seconds with a disinfecting wipe. 9. Attach the prefilled syringe to the injection cap. 10. Flush the catheter by pushing the plunger forward until all the liquid from the syringe is in the catheter. 11. Remove the syringe from the injection cap. 12. Clamp the catheter. If there is no heparin in your catheter: 1. Wash your hands with soap and water. 2. Put on gloves. 3. Scrub the injection cap for 15 seconds with a disinfecting wipe. 4. Unclamp the catheter. 5. Attach the prefilled syringe to the injection cap. 6. Flush the catheter by pushing the plunger forward until 5 mL of the liquid from the syringe is in the catheter. 7. Pull back on  the syringe until you see blood in the catheter. 8. If you have been asked to collect any blood, follow your health care provider's instructions. Otherwise, flush the catheter with the rest of the solution from the syringe. 9. Remove the syringe from the injection cap. 10. Clamp the catheter.  Flushing your catheter after use 1. Wash your hands with soap and water. 2. Put on gloves. 3. Scrub the injection cap for 15 seconds with a disinfecting wipe. 4. Unclamp the catheter. 5. Attach the prefilled syringe to the injection cap. 6. Flush the catheter by pushing the plunger forward until all of the liquid from the syringe is in the catheter. 7. Remove the syringe from the injection cap. 8. Clamp the catheter. Problems and solutions  If blood cannot be completely cleared from the injection cap, you may need to have the injection cap replaced.  If the catheter is difficult to flush, use the pulsing method. The pulsing method involves pushing only a few milliliters of solution into the catheter at a time and pausing between pushes.  If you do not see blood in the catheter when you pull back on the syringe, change your body position, such as by raising your arms above your head. Take a deep breath and cough. Then, pull back on the syringe. If you still do not see blood, flush the catheter with a small amount of solution. Then, change positions again and take a breath or cough. Pull back on the syringe again. If you still do not see  blood, finish flushing the catheter and contact your health care provider. Do not use your catheter until your health care provider says it is okay. General tips  Have someone help you flush your catheter, if possible.  Do not force fluid through your catheter.  Do not use a syringe that is larger or smaller than 10 mL. Using a smaller syringe can make the catheter burst.  Do not use your catheter without flushing it first if it has heparin in it. Contact a health  care provider if:  You cannot see any blood in the catheter when you flush it before using it.  Your catheter is difficult to flush. Get help right away if:  You cannot flush the catheter.  The catheter leaks when you flush it or when there is fluid in it.  There are cracks or breaks in the catheter. Summary  It is important to flush your tunneled central venous catheter each time you use it, both before and after you use it.  Scrub the injection cap for 15 seconds with a disinfecting wipe before and after you flush it.  When you flush your catheter, make sure you follow any specific instructions from your health care provider or the manufacturer.  Get help right away if you cannot flush the catheter. This information is not intended to replace advice given to you by your health care provider. Make sure you discuss any questions you have with your health care provider. Document Revised: 12/18/2018 Document Reviewed: 06/10/2018 Elsevier Patient Education  [place].

## 2019-07-09 ENCOUNTER — Encounter (HOSPITAL_COMMUNITY): Payer: Self-pay | Admitting: Hematology

## 2019-07-09 ENCOUNTER — Telehealth: Payer: Self-pay | Admitting: *Deleted

## 2019-07-09 NOTE — Telephone Encounter (Signed)
Per Cira Rue, NP, called to make pt aware that her tumor marker remains normal and to f/u as scheduled. Pt verbalized understanding.

## 2019-07-09 NOTE — Telephone Encounter (Signed)
-----   Message from Alla Feeling, NP sent at 07/09/2019 10:01 AM EDT ----- Please let her know the tumor marker remains normal.  Thanks, Regan Rakers

## 2019-07-13 NOTE — Progress Notes (Signed)
Pharmacist Chemotherapy Monitoring - Follow Up Assessment    I verify that I have reviewed each item in the below checklist:  . Regimen for the patient is scheduled for the appropriate day and plan matches scheduled date. [name] Appropriate non-routine labs are ordered dependent on drug ordered. . If applicable, additional medications reviewed and ordered per protocol based on lifetime cumulative doses and/or treatment regimen.   Plan for follow-up and/or issues identified: No . I-vent associated with next due treatment: No . MD and/or nursing notified: No  .   Donnabelle Blanchard D 07/13/2019 4:21 PM

## 2019-07-15 NOTE — Progress Notes (Signed)
[place]   [phone] Fax:(336) [phone]   Clinic Follow up Note   Patient Care Team: Lattie Haw, MD as PCP - General Melan[place], York Ram, MD (Inactive) as Resident (Family Medicine) Jonnie Finner, RN as Oncology Nurse Navigator Jonnie Finner, RN as Oncology Nurse Navigator  Date of Service:  07/19/2019  CHIEF COMPLAINT: F/u for Metastatic Cholangiocarcinoma   SUMMARY OF ONCOLOGIC HISTORY: Oncology History  Intrahepatic cholangiocarcinoma ([place])  2014 Initial Diagnosis   Intrahepatic cholangiocarcinoma ([place])   2014 Initial Diagnosis   Per notes from [facility] by Dr. Mettu 1. 2014 Mass in the left side of the liver incidentally found during work up for acute pancreatitis.  The mass was thought to be a bile duct lesion and was followed up.  2. 02/2013 MRI hyperdense focus within the left hepatic lobe.  3. 02/21/13 CT scan 3.7 x 2.1 x 2.4 cm arterial enhancing lesion.  AFP and CEA were normal.  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 4. 05/18/12 Biopsy of something (surg path in Care Everywhere, but no report) 5. 07/02/13 Biopsy of something (surg path in Care Everywhere, but no report) 6. 09/02/13 MRI lesion in segment 4A with adjacent perfusional enhancement with some washout. Lesion is close to left portal vein. 7. 09/15/14 MRI segment 4A lesion slightly increased in size, minimal fat containing, peripheral enhancement and arterial enhancement. Left portal vein is clearly not involved.   02/19/2015 Surgery   Per notes from [facility] by Dr. Dennison Nancy L lobe of liver partial hepatectomy segments 2, 3, 4a, 4b invasive cholangiocarcinoma, moderately differentiated. Tumor focally extends to the parenchymal margin of resection. Uninvolved liver parenchyma with steatohepatitis and associated periportal and centrilobular pericellular fibrosis. Scattered von Meyenburg complexes. One hepatic artery LN with metastatic cholangiocarcinoma.  Fibroadipose tissue and nerve negative for malignancy. Gallbladder negative for malignancy. One LN negative for malignancy. T2aN1 invasive cholangiocarcinoma, moderately differentiated, with positive hepatic parenchymal margin, and 1/2 nodes positive for tumor involvement. -Post operative course was complicated by fevers felt to be related to atelectasis   04/17/2015 Concurrent Chemotherapy   Per notes from [place] by Dr. Dennison Nancy -04/17/15-05/26/15 Chemoradiation with capecitabine (locally) 50.4 Gy in 28 fractions.  -Patient started getting sick, hospitalized, fluids twice, was on ondansetron, and propranolol, and sucralfate  -EGD found ulcers in stomach  -06/14/15 CTA (per Dr. Shayne Alken note) no evidence of disease recurrence.  Formation of the stomach and duodenum was seen.   08/2015 - 2017 Chemotherapy   08/2015 4 cycles of gemcitabine.  Patient intolerant to this treatment with weight loss, anorexia, and fatigue.    10/16/2015 Surgery   Per notes from [place] by Dr. Dennison Nancy -CTA chest abdomen and pelvis status post left hepatectomy with no evidence of recurrent disease or metastatic disease.  Right lower lobe pulmonary nodules unchanged.  CA 19-9 7   08/11/2017 Pathology Results   Per notes from [place] by Dr. Dennison Nancy -07/29/17 CT abd -U/S biopsy of liver    08/27/2017 Imaging   Per note from [facility] by Dr. Dennison Nancy --08/25/17 CT chest, abdomen, and pelvis ill-defined hypoenhancing lesion in the inferior right liver concerning for m metastatic disease. Portocaval node measuring up to 1.2 cm in short axis   08/28/2017 Pathology Results   Per note from [facility] by Dr. Dennison Nancy -Invasive moderate [place] differentiated adenocarcinoma morphologically compatible with cholangio- -09/02/17 MDC consensus appears consistent with intrahepatic cholangiocarcinoma and surgical resection not appropriate.  If after systemic therapy she remains stable, no  extrahepatic disease, could consider HAI pump in future.  Ablation not an option.     09/24/2017 - 10/15/2017 Radiation Therapy   Per note from [facility] by Dr. Dennison Nancy -XRT x3 weeks   12/30/2017 Imaging   Per note from [facility] by Dr. Dennison Nancy CT chest, abdomen, and pelvis slight interval increase in size of metastatic lesion in the liver with increasing necrosis.  Stable right lower lobe pulmonary nodule and portacaval lymph nodes   10/14/2018 Imaging   Per note from [facility] by Dr. Dennison Nancy CT chest, abdomen, and pelvis decrease size of liver lesions, PVT new, 1 to centimeter lymph node   05/14/2019 Imaging   Per note from [facility] by Dr. Dennison Nancy -CT infiltrative mass within the porta hepatis encasing the portal triads measuring up to 4.1 cm, concerning for residual/recurrent cholangiocarcinoma.  There is worsening thrombosis/tumor in the vein involving the entire portal venous system now extending into the right portal vein and distal aspect of the splenic vein.  There are at least 6 new liver lesions concerning for multifocal hepatic metastases.  Stable retroperitoneal lymphadenopathy.  Similar appearing distal gastric wall thickening, likely post radiation changes.  Stable subcentimeter pulmonary nodules, the largest within the right lower lobe measuring 0.8 cm.  Recommend continued attention to follow-up.   05/14/2019 Cancer Staging   Staging form: Intrahepatic Bile Duct, AJCC 8th Edition - Clinical stage from 05/14/2019: Stage IV (cTX, cNX, cM1) - Signed by Truitt Merle, MD on 06/20/2019   06/21/2019 -  Chemotherapy   First-line 5-fluorouracil and leucovorin IV every 2 weeks starting 06/21/19      CURRENT THERAPY:  First-line 5-fluorouracil and leucovorin IV every 2 weeks starting 06/21/19  INTERVAL HISTORY:  Bonnie Peterson is here for a follow up and treatment. She presents to the clinic alone.  She is doing moderately well overall.  She has noticed some mild bruise on her face, and neck, mild nose bleeding after chemo, resolved now. She had low appetite after chemo, recovers in 3-4 days, lost 4 lbs  in a month. She has BM 3-4 times a day, she has IBS, so this is not new to her. NO pain, energy level is low, she has lost some hair. No other new symptoms. ROS otherwise is negative.   MEDICAL HISTORY:  Past Medical History:  Diagnosis Date  . Anxiety   . Benign positional vertigo   . Cancer ([place]) 02/09/15   intrahepatic cholangiocarcinoma  . COPD (chronic obstructive pulmonary disease) ([place])   . Depression   . Diabetes mellitus without complication ([place])   . Early cataracts, bilateral 10/13   Optho, Dr Gershon Crane  . Echocardiogram findings abnormal, without diagnosis 10/10   10/10: mild pulm HTN, EF 60-65%, mild LVH, moderate aortic regurg  . GERD (gastroesophageal reflux disease)    I have "acid reflux"  . Gout   . HLD (hyperlipidemia)   . HTN, goal below 130/80   . Irritable bowel syndrome   . Obesity, Class III, BMI 40-49.9 (morbid obesity) ([place])   . Obstructive sleep apnea    wears cpap  . Osteoarthritis (arthritis due to wear and tear of joints)    also gout  . Restrictive lung disease     SURGICAL HISTORY: Past Surgical History:  Procedure Laterality Date  . ABDOMINAL HYSTERECTOMY    . ESOPHAGOGASTRODUODENOSCOPY N/A 06/15/2015   Procedure: ESOPHAGOGASTRODUODENOSCOPY (EGD);  Surgeon: Gatha Mayer, MD;  Location: [facility];  Service: Endoscopy;  Laterality: N/A;  . IR IMAGING GUIDED PORT INSERTION  06/15/2019  . IR REMOVAL TUN ACCESS W/ PORT W/O FL MOD SED  11/11/2016  . LIVER BIOPSY    . OPEN PARTIAL HEPATECTOMY  Left 02/09/15  . PARTIAL HYSTERECTOMY    . ROTATOR CUFF REPAIR       L rotator cuff repair 11/07-[place] - 12/7/200  . TONSILLECTOMY      I have reviewed the social history and family history with the patient and they are unchanged from previous note.  ALLERGIES:  is allergic to other.  MEDICATIONS:  Current Outpatient Medications  Medication Sig Dispense Refill  . albuterol (PROAIR HFA) 108 (90 Base) MCG/ACT inhaler INHALE 2 PUFFS INTO THE LUNGS EVERY 4  HOURS AS NEEDED FOR WHEEZING OR SHORTNESS OF BREATH 1 Inhaler 0  . alendronate (FOSAMAX) 70 MG tablet TAKE 1 TABLET  ONCE A WEEK. 12 tablet 2  . allopurinol (ZYLOPRIM) 100 MG tablet Take 1 tablet (100 mg total) by mouth daily. (Patient not taking: Reported on 07/06/2019) 90 tablet 2  . amLODipine (NORVASC) 10 MG tablet TAKE 1 TABLET EVERY DAY 90 tablet 1  . atenolol (TENORMIN) 25 MG tablet Take 1 tablet (25 mg total) by mouth daily. 90 tablet 3  . cholecalciferol (VITAMIN D) 1000 units tablet Take 1 tablet (1,000 Units total) by mouth daily. 30 tablet 11  . diclofenac sodium (VOLTAREN) 1 % GEL Apply 2 g topically 4 (four) times daily as needed. 100 g 4  . famotidine (PEPCID) 20 MG tablet Take 1 tablet (20 mg total) by mouth 2 (two) times daily. 180 tablet 3  . fish oil-omega-3 fatty acids 1000 MG capsule Take 1 g by mouth every 30 (thirty) days.     . fluticasone (FLONASE) 50 MCG/ACT nasal spray Place 2 sprays into both nostrils daily as needed for rhinitis. 16 g 2  . hydrochlorothiazide (HYDRODIURIL) 25 MG tablet Take 1 tablet (25 mg total) by mouth daily. 90 tablet 3  . Hyprom-Naphaz-Polysorb-Zn Sulf (CLEAR EYES COMPLETE OP) Place 1 drop into both eyes daily as needed (dry eyes).     [name] lidocaine-prilocaine (EMLA) cream Apply 1 application topically every 14 (fourteen) days. Every 14 days as needed 30 g 2  . lisinopril (ZESTRIL) 40 MG tablet Take 1 tablet (40 mg total) by mouth daily. 90 tablet 3  . loratadine (CLARITIN) 10 MG tablet Take 1 tablet (10 mg total) by mouth daily as needed for allergies. 30 tablet 3  . LORazepam (ATIVAN) 0.5 MG tablet Take 0.5-1 tablets (0.25-0.5 mg total) by mouth at bedtime as needed for anxiety. 20 tablet 0  . metFORMIN (GLUCOPHAGE) 500 MG tablet TAKE 2 TABLETS TWICE DAILY WITH MEALS 360 tablet 3  . mirtazapine (REMERON SOL-TAB) 15 MG disintegrating tablet Take 1 tablet (15 mg total) by mouth at bedtime. 30 tablet 2  . Multiple Vitamin (MULTIVITAMIN WITH MINERALS)  TABS tablet Take 1 tablet by mouth daily.    . ondansetron (ZOFRAN) 8 MG tablet Take 1 tablet (8 mg total) by mouth 2 (two) times daily as needed for refractory nausea / vomiting. Start on day 3 after chemotherapy. (Patient not taking: Reported on 07/06/2019) 30 tablet 2  . prochlorperazine (COMPAZINE) 10 MG tablet Take 1 tablet (10 mg total) by mouth every 6 (six) hours as needed (Nausea or vomiting). (Patient not taking: Reported on 07/06/2019) 30 tablet 1  . Spacer/Aero-Holding Chambers (AEROCHAMBER MV) inhaler Use as instructed (Patient not taking: Reported on 07/06/2019) 1 each 0  . VITAMIN E PO Take 1 tablet by mouth daily.  No current facility-administered medications for this visit.    PHYSICAL EXAMINATION: ECOG PERFORMANCE STATUS: 2 - Symptomatic, <50% confined to bed  Vitals:   07/19/19 0807  BP: (!) 159/55  Pulse: 70  Resp: 18  Temp: 98.3 F (36.8 C)  SpO2: 100%   Filed Weights   07/19/19 0807  Weight: 177 lb 4.8 oz (80.4 kg)   GENERAL:alert, no distress and comfortable SKIN: skin color, texture, turgor are normal, no rashes or significant lesions EYES: normal, Conjunctiva are pink and non-injected, sclera clear NECK: supple, thyroid normal size, non-tender, without nodularity LYMPH:  no palpable lymphadenopathy in the cervical, axillary  LUNGS: clear to auscultation and percussion with normal breathing effort HEART: regular rate & rhythm and no murmurs and no lower extremity edema ABDOMEN:abdomen soft, non-tender and normal bowel sounds Musculoskeletal:no cyanosis of digits and no clubbing  NEURO: alert & oriented x 3 with fluent speech, no focal motor/sensory deficits  LABORATORY DATA:  I have reviewed the data as listed CBC Latest Ref Rng & Units 07/19/2019 07/06/2019 06/21/2019  WBC 4.0 - 10.5 K/uL 4.9 4.9 6.9  Hemoglobin 12.0 - 15.0 g/dL 11.0(L) 10.9(L) 11.6(L)  Hematocrit 36.0 - 46.0 % 34.8(L) 34.6(L) 37.4  Platelets 150 - 400 K/uL 114(L) 103(L) 133(L)      CMP Latest Ref Rng & Units 07/19/2019 07/06/2019 06/21/2019  Glucose 70 - 99 mg/dL 163(H) 131(H) 208(H)  BUN 8 - 23 mg/dL 11 11 15   Creatinine 0.44 - 1.00 mg/dL 0.82 0.80 0.84  Sodium 135 - 145 mmol/L 142 141 139  Potassium 3.5 - 5.1 mmol/L 3.7 3.9 3.9  Chloride 98 - 111 mmol/L 105 107 103  CO2 22 - 32 mmol/L 26 25 27   Calcium 8.9 - 10.3 mg/dL 9.3 8.9 9.2  Total Protein 6.5 - 8.1 g/dL 7.0 6.9 7.0  Total Bilirubin 0.3 - 1.2 mg/dL 0.3 0.4 0.3  Alkaline Phos 38 - 126 U/L 102 102 105  AST 15 - 41 U/L 20 24 24   ALT 0 - 44 U/L 13 16 16       RADIOGRAPHIC STUDIES: I have personally reviewed the radiological images as listed and agreed with the findings in the report. No results found.   ASSESSMENT & PLAN:  TRANG BOUSE is a 74 y.o. female with    1. 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 She was treated with adjuvant concurrent ChemoRT with Xeloda for about 6 weeks.  -Based on 08/2017 CT scan and Liver biopsy at [facility] she had cancer recurrence with metastasis. She was then treated with Radiation for about 3 weeks  -Her 05/14/19 CT from [place] shows infiltrative mass within the porta hepatis encasing the portal triads measuring up to 4.1 cm, concerning for residual/recurrent cholangiocarcinoma. She is stage IV and her cancer is no longer curable at this point but still treatable.  -Given poor tolerance to more intensive treatment in the past, I started her on first-line 5-fluorouracil and leucovorin IV every 2 weeks on 06/21/19. Goal of therapy is to control her disease and prolong her life.  -She has been tolerating chemotherapy moderately well, with moderate fatigue, anorexia after chemo, is able to recover.  He has lost some weight.  I will cancel the 5-FU bolus, and continue the pump infusion. -Lab reviewed,  mild thrombocytopenia, adequate for treatment. -Plan to repeat a CT scan after cycle  5   2. DM, HTN -Will watch on chemo. We discussed that chemo could affect her BP and BG. I recommend she watch her BP at home.  -She is apprehensive about using BG monitor given concerns of finger numbness.  -Her blood pressure and random sugar has been stable lately   3. Mild anemia and thrombocytopenia -Secondary to chemotherapy, overall stable  4.Goal of care discussion  -We again discussed the incurable nature of her cancer, and the overall poor prognosis, especially if she does not have good response to chemotherapy or progress on chemo -The patient understands the goal of care is palliative. -she is full code now   PLAN:  -Labs reviewed and adequate to proceed with C3 5FU and Leucovorin.  Will cancel her 5-FU bolus, due to her moderate fatigue and anorexia after chemo. -Lab, flush, f/u and chemo in 2, 4, 6 weeks  -Repeat CT scan after cycle 5    No problem-specific Assessment & Plan notes found for this encounter.   No orders of the defined types were placed in this encounter.  All questions were answered. The patient knows to call the clinic with any problems, questions or concerns. No barriers to learning was detected. The total time spent in the appointment was 30 minutes.     Truitt Merle, MD 07/19/2019   I, Joslyn Devon, am acting as scribe for Truitt Merle, MD.   I have reviewed the above documentation for accuracy and completeness, and I agree with the above.

## 2019-07-16 ENCOUNTER — Other Ambulatory Visit: Payer: Self-pay

## 2019-07-16 ENCOUNTER — Ambulatory Visit (INDEPENDENT_AMBULATORY_CARE_PROVIDER_SITE_OTHER): Payer: Medicare HMO | Admitting: Family Medicine

## 2019-07-16 DIAGNOSIS — F329 Major depressive disorder, single episode, unspecified: Secondary | ICD-10-CM

## 2019-07-16 DIAGNOSIS — F419 Anxiety disorder, unspecified: Secondary | ICD-10-CM

## 2019-07-16 MED ORDER — MIRTAZAPINE 15 MG PO TBDP: 15.0000 mg | ORAL_TABLET | Freq: Every day | ORAL | 2 refills | Status: DC

## 2019-07-16 MED FILL — MIRTAZAPINE 15 MG ODT: 15 | 30 days supply | Qty: 30 | Fill #0

## 2019-07-16 NOTE — Progress Notes (Signed)
PHQ given as instructed by [place] Poli.  Provider aware.  Bonnie Peterson, [place]

## 2019-07-16 NOTE — Patient Instructions (Addendum)
It was wonderful to see you today.  Please bring ALL of your medications with you to every visit.   Thank you for choosing [facility].   Please call [phone] with any questions about today's appointment.  Please be sure to schedule follow up at the front  desk before you leave today.   Lattie Haw, MD MBBS     Cancer Support Programs  The [facility] is partnering with local and national organizations to create innovative ways to support you online.  Our chaplain, Lorrin Jackson, is available by phone if you'd like to contact her for support, guidance, or a friendly conversation. She can be reached at [phone].  Licensed Clinical Social Workers are members of the health care team who assist patients and families with the stresses associated with sudden and chronic illness and its impact on everyday life.  Throughout your time at [facility], your clinical social worker is available to help you and your family cope with many issues that may arise throughout cancer treatment.  To connect with a Education officer, museum, you can call [phone].  Healing Arts at [facility] and [facility] are please to offer our patients in treatment and their caregivers an inspiring at-home art experience. Register below to receive a free Healing Arts kit mailed to your home.   Each individual video is accompanied by a special art kit.

## 2019-07-19 ENCOUNTER — Encounter: Payer: Self-pay | Admitting: Hematology

## 2019-07-19 ENCOUNTER — Inpatient Hospital Stay: Payer: Medicare HMO

## 2019-07-19 ENCOUNTER — Other Ambulatory Visit: Payer: Self-pay

## 2019-07-19 ENCOUNTER — Inpatient Hospital Stay: Payer: Medicare HMO | Admitting: Hematology

## 2019-07-19 VITALS — BP 159/55 | HR 70 | Temp 98.3°F | Resp 18 | Ht 62.0 in | Wt 177.3 lb

## 2019-07-19 DIAGNOSIS — Z5111 Encounter for antineoplastic chemotherapy: Secondary | ICD-10-CM | POA: Diagnosis not present

## 2019-07-19 DIAGNOSIS — D6959 Other secondary thrombocytopenia: Secondary | ICD-10-CM | POA: Diagnosis not present

## 2019-07-19 DIAGNOSIS — I10 Essential (primary) hypertension: Secondary | ICD-10-CM | POA: Diagnosis not present

## 2019-07-19 DIAGNOSIS — C221 Intrahepatic bile duct carcinoma: Secondary | ICD-10-CM

## 2019-07-19 DIAGNOSIS — T451X5A Adverse effect of antineoplastic and immunosuppressive drugs, initial encounter: Secondary | ICD-10-CM | POA: Diagnosis not present

## 2019-07-19 DIAGNOSIS — C787 Secondary malignant neoplasm of liver and intrahepatic bile duct: Secondary | ICD-10-CM | POA: Diagnosis not present

## 2019-07-19 DIAGNOSIS — E119 Type 2 diabetes mellitus without complications: Secondary | ICD-10-CM | POA: Diagnosis not present

## 2019-07-19 DIAGNOSIS — F329 Major depressive disorder, single episode, unspecified: Secondary | ICD-10-CM | POA: Insufficient documentation

## 2019-07-19 DIAGNOSIS — F32A Depression, unspecified: Secondary | ICD-10-CM | POA: Insufficient documentation

## 2019-07-19 DIAGNOSIS — J449 Chronic obstructive pulmonary disease, unspecified: Secondary | ICD-10-CM | POA: Diagnosis not present

## 2019-07-19 DIAGNOSIS — D6481 Anemia due to antineoplastic chemotherapy: Secondary | ICD-10-CM | POA: Diagnosis not present

## 2019-07-19 DIAGNOSIS — Z95828 Presence of other vascular implants and grafts: Secondary | ICD-10-CM

## 2019-07-19 DIAGNOSIS — S0083XA Contusion of other part of head, initial encounter: Secondary | ICD-10-CM | POA: Diagnosis not present

## 2019-07-19 LAB — CMP (CANCER CENTER ONLY)
ALT: 13 U/L (ref 0–44)
AST: 20 U/L (ref 15–41)
Albumin: 3 g/dL — ABNORMAL LOW (ref 3.5–5.0)
Alkaline Phosphatase: 102 U/L (ref 38–126)
Anion gap: 11 (ref 5–15)
BUN: 11 mg/dL (ref 8–23)
CO2: 26 mmol/L (ref 22–32)
Calcium: 9.3 mg/dL (ref 8.9–10.3)
Chloride: 105 mmol/L (ref 98–111)
Creatinine: 0.82 mg/dL (ref 0.44–1.00)
GFR, Est AFR Am: >60 mL/min (ref >60)
GFR, Estimated: >60 mL/min (ref >60)
Glucose, Bld: 163 mg/dL — ABNORMAL HIGH (ref 70–99)
Potassium: 3.7 mmol/L (ref 3.5–5.1)
Sodium: 142 mmol/L (ref 135–145)
Total Bilirubin: 0.3 mg/dL (ref 0.3–1.2)
Total Protein: 7 g/dL (ref 6.5–8.1)

## 2019-07-19 LAB — CBC WITH DIFFERENTIAL (CANCER CENTER ONLY)
Abs Immature Granulocytes: 0.01 10*3/uL (ref 0.00–0.07)
Basophils Absolute: 0 10*3/uL (ref 0.0–0.1)
Basophils Relative: 1 %
Eosinophils Absolute: 0.1 10*3/uL (ref 0.0–0.5)
Eosinophils Relative: 1 %
HCT: 34.8 % — ABNORMAL LOW (ref 36.0–46.0)
Hemoglobin: 11 g/dL — ABNORMAL LOW (ref 12.0–15.0)
Immature Granulocytes: 0 %
Lymphocytes Relative: 25 %
Lymphs Abs: 1.2 10*3/uL (ref 0.7–4.0)
MCH: 28.4 pg (ref 26.0–34.0)
MCHC: 31.6 g/dL (ref 30.0–36.0)
MCV: 89.7 fL (ref 80.0–100.0)
Monocytes Absolute: 0.6 10*3/uL (ref 0.1–1.0)
Monocytes Relative: 13 %
Neutro Abs: 3 10*3/uL (ref 1.7–7.7)
Neutrophils Relative %: 60 %
Platelet Count: 114 10*3/uL — ABNORMAL LOW (ref 150–400)
RBC: 3.88 MIL/uL (ref 3.87–5.11)
RDW: 16.3 % — ABNORMAL HIGH (ref 11.5–15.5)
WBC Count: 4.9 10*3/uL (ref 4.0–10.5)
nRBC: 0 % (ref 0.0–0.2)

## 2019-07-19 MED ORDER — SODIUM CHLORIDE 0.9 % IV SOLN
400.0000 mg/m2 | Freq: Once | INTRAVENOUS | Status: AC
  Administered 2019-07-19: 768 mg via INTRAVENOUS
  Filled 2019-07-19: qty 38.4

## 2019-07-19 MED ORDER — PROCHLORPERAZINE MALEATE 10 MG PO TABS
10.0000 mg | ORAL_TABLET | Freq: Once | ORAL | Status: AC
  Administered 2019-07-19: 09:00:00 10 mg via ORAL

## 2019-07-19 MED ORDER — PROCHLORPERAZINE MALEATE 10 MG PO TABS
ORAL_TABLET | ORAL | Status: AC
  Filled 2019-07-19: qty 1

## 2019-07-19 MED ORDER — SODIUM CHLORIDE 0.9 % IV SOLN
2400.0000 mg/m2 | INTRAVENOUS | Status: DC
  Administered 2019-07-19: 4600 mg via INTRAVENOUS
  Filled 2019-07-19: qty 92

## 2019-07-19 MED ORDER — SODIUM CHLORIDE 0.9 % IV SOLN
Freq: Once | INTRAVENOUS | Status: AC
  Filled 2019-07-19: qty 250

## 2019-07-19 MED ORDER — SODIUM CHLORIDE 0.9% FLUSH
10.0000 mL | INTRAVENOUS | Status: DC | PRN
  Administered 2019-07-19: 10 mL
  Filled 2019-07-19: qty 10

## 2019-07-19 NOTE — Progress Notes (Signed)
    SUBJECTIVE:   CHIEF COMPLAINT / HPI:   Bonnie Peterson is a 74 year old female who presents today for a follow-up  Metastatic cholangiocarcinoma Reports that she has started her chemotherapy and completed 2 cycles already, she is due for her next chemotherapy next week.  Endorses recent anorexia. Usually eats one meal a day either breakfast or lunch without a problem and by the time it gets to dinnertime has no appetite.  Is concerned about her low appetite and weight loss also endorses right upper quadrant pain from time to time, takes Tylenol and ibuprofen which helps ease the pain.  Patient was very tearful when talking about her cancer.  She does not like to speak to her children about it as they are in denial that she has cancer and that it is terminal.  She finds it difficult to speak to her siblings who " baby" her.  Initially was against the idea of receiving help during this difficult time but has agreed to have more office/virtual visit with me and speak to a therapist.  Anxiety and depression related to cancer Oncologist had prescribed her Ativan which is helping with her anxiety and keeps her calm.  She has around 10 tablets left.  Endorses feeling low but denies suicidal ideation, thoughts of self-harm or harm to others.  Is agreeable to taking mirtazapine today for depression and to improve appetite.  PERTINENT  PMH / PSH: Cholangiocarcinoma, diabetes  Back   BP 125/75   Pulse 75   Ht 5' 2"  (1.575 m)   Wt 81.3 kg   SpO2 99%   BMI 32.78 kg/m   General: Alert, well-appearing 74 year old female, tearful, pleasant Cardio: Normal S1 and S2, RRR  Pulm: Clear to auscultation bilaterally, no crackles, wheezing, or diminished breath sounds. Normal respiratory effort Abdomen: Bowel sounds normal. Abdomen soft, mildly tender RUQ. Extremities: No peripheral edema. Warm/ well perfused.  Strong radial. 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 [id number] today. No SI. Patient happy to start mirtazapine for low mood and anorexia.  Also provided patient with resources for therapy cancer support programs.  Follow-up with me in the next 2 weeks.  Anxiety Anxiety precipitated by terminal cancer diagnosis. Patient will continue on Ativan prescribed by Oncologist as it is helping, she has 10 tablets left. Provided cancer support resources and hopefully speaking to a therapist will reduce anxiety. Will reassess patient's anxiety with GAD at next visit.      Lattie Haw, MD [place]

## 2019-07-19 NOTE — Assessment & Plan Note (Signed)
Anxiety precipitated by terminal cancer diagnosis. Patient will continue on Ativan prescribed by Oncologist as it is helping, she has 10 tablets left. Provided cancer support resources and hopefully speaking to a therapist will reduce anxiety. Will reassess patient's anxiety with GAD at next visit.

## 2019-07-19 NOTE — Patient Instructions (Signed)
[facility] Discharge Instructions for Patients Receiving Chemotherapy  Today you received the following chemotherapy agents: leucovorin and fluorouracil.  To help prevent nausea and vomiting after your treatment, we encourage you to take your nausea medication as directed.   If you develop nausea and vomiting that is not controlled by your nausea medication, call the clinic.   BELOW ARE SYMPTOMS THAT SHOULD BE REPORTED IMMEDIATELY:  *FEVER GREATER THAN 100.5 F  *CHILLS WITH OR WITHOUT FEVER  NAUSEA AND VOMITING THAT IS NOT CONTROLLED WITH YOUR NAUSEA MEDICATION  *UNUSUAL SHORTNESS OF BREATH  *UNUSUAL BRUISING OR BLEEDING  TENDERNESS IN MOUTH AND THROAT WITH OR WITHOUT PRESENCE OF ULCERS  *URINARY PROBLEMS  *BOWEL PROBLEMS  UNUSUAL RASH Items with * indicate a potential emergency and should be followed up as soon as possible.  Feel free to call the clinic should you have any questions or concerns. The clinic phone number is [phone].  Please show the [place] at check-in to the Emergency Department and triage nurse.

## 2019-07-19 NOTE — Assessment & Plan Note (Signed)
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 [id number] today. No SI. Patient happy to start mirtazapine for low mood and anorexia.  Also provided patient with resources for therapy cancer support programs.  Follow-up with me in the next 2 weeks.

## 2019-07-21 ENCOUNTER — Ambulatory Visit: Payer: Medicare HMO | Admitting: Gastroenterology

## 2019-07-21 ENCOUNTER — Ambulatory Visit: Payer: Medicare HMO | Admitting: Internal Medicine

## 2019-07-21 ENCOUNTER — Inpatient Hospital Stay: Payer: Medicare HMO

## 2019-07-21 ENCOUNTER — Other Ambulatory Visit: Payer: Self-pay

## 2019-07-21 ENCOUNTER — Encounter: Payer: Self-pay | Admitting: Gastroenterology

## 2019-07-21 VITALS — BP 160/60 | HR 64 | Temp 98.1°F | Ht 62.0 in | Wt 193.0 lb

## 2019-07-21 VITALS — BP 132/72 | HR 72 | Temp 98.2°F | Resp 18

## 2019-07-21 DIAGNOSIS — D6481 Anemia due to antineoplastic chemotherapy: Secondary | ICD-10-CM | POA: Diagnosis not present

## 2019-07-21 DIAGNOSIS — S0083XA Contusion of other part of head, initial encounter: Secondary | ICD-10-CM | POA: Diagnosis not present

## 2019-07-21 DIAGNOSIS — E119 Type 2 diabetes mellitus without complications: Secondary | ICD-10-CM | POA: Diagnosis not present

## 2019-07-21 DIAGNOSIS — T451X5A Adverse effect of antineoplastic and immunosuppressive drugs, initial encounter: Secondary | ICD-10-CM | POA: Diagnosis not present

## 2019-07-21 DIAGNOSIS — C221 Intrahepatic bile duct carcinoma: Secondary | ICD-10-CM

## 2019-07-21 DIAGNOSIS — C787 Secondary malignant neoplasm of liver and intrahepatic bile duct: Secondary | ICD-10-CM | POA: Diagnosis not present

## 2019-07-21 DIAGNOSIS — K625 Hemorrhage of anus and rectum: Secondary | ICD-10-CM

## 2019-07-21 DIAGNOSIS — I10 Essential (primary) hypertension: Secondary | ICD-10-CM | POA: Diagnosis not present

## 2019-07-21 DIAGNOSIS — Z5111 Encounter for antineoplastic chemotherapy: Secondary | ICD-10-CM | POA: Diagnosis not present

## 2019-07-21 DIAGNOSIS — D6959 Other secondary thrombocytopenia: Secondary | ICD-10-CM | POA: Diagnosis not present

## 2019-07-21 MED ORDER — SUPREP BOWEL PREP KIT 17.5-3.13-1.6 GM/177ML PO SOLN: 1.0000 | ORAL | 0 refills | Status: DC

## 2019-07-21 MED ORDER — SODIUM CHLORIDE 0.9% FLUSH
10.0000 mL | INTRAVENOUS | Status: DC | PRN
  Administered 2019-07-21: 10 mL
  Filled 2019-07-21: qty 10

## 2019-07-21 MED ORDER — HEPARIN SOD (PORK) LOCK FLUSH 100 UNIT/ML IV SOLN
500.0000 [IU] | Freq: Once | INTRAVENOUS | Status: AC | PRN
  Administered 2019-07-21: 500 [IU]
  Filled 2019-07-21: qty 5

## 2019-07-21 MED FILL — SUPREP BOWEL PREP KIT: 17.5-3.13-1 | 1 days supply | Qty: 354 | Fill #0

## 2019-07-21 NOTE — Progress Notes (Signed)
Review of pertinent gastrointestinal problems: 1. 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 CT scan of the abdomen and pelvis which also shows a 3.7 x 2.1 x 2.4 cm arterial enhancing lesion. there were no additional hepatic masses identified hepatic and portal vein patent, spleen and pancreas grossly unremarkable, question possible mild cirrhotic changes  Hepatic panel was last done on 02/10/2013 and was normal ProTime 12.8 INR of 0.9 hepatitis serologies were negative with the exception of hepatitis B surface antibody positive. Alpha-fetoprotein and CEA were normal. 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 Slightly enlarged from 2.3 x 2.2 cm on the prior exam (when remeasured). Planning to repeat MRI in 1 year. Repeat MRI 2016 suggested a liver lesion had increased in size. Repeat biopsies showed atypical cells. Second opinion at [facility] felt that this was cholangiocarcinoma and she underwent hepatic resection.  02/09/15 L lobe of liver partial hepatectomy segments 2, 3, 4a, 4b invasive cholangiocarcinoma, moderately differentiated. Tumor focally extends to the parenchymal margin of resection. Uninvolved liver parenchyma with steatohepatitis and associated periportal and centrilobular pericellular fibrosis. Scattered von Meyenburg complexes. One hepatic artery LN with metastatic cholangiocarcinoma. Fibroadipose tissue and nerve negative for malignancy. Gallbladder negative for malignancy. One LN negative for malignancy. T2aN1 invasive cholangiocarcinoma, moderately differentiated, with positive hepatic parenchymal margin, and 1/2 nodes positive for tumor involvement.  Sees oncology at [facility]  as well as [name]  As of oncology notes March 2021 she was felt to have metastatic incurable intrahepatic cholangiocarcinoma.  She was on palliative chemotherapy. 2. Adenomatous colon polyps: Colonsocopy [name] 06/2013 for minor rectal bleeding: two small TAs removed (recall 5 years) 3. 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 4. 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 EGD Dr. Carlean Purl 06/2015 gastritis, pyloric stenosis felt from edema (incomplete), path c/w radiation damage.   HPI: This is a very pleasant 74 year old woman whom I last saw about 4 years ago.  Unfortunately her intrahepatic cholangiocarcinoma has proven to be a metastatic process and she has recently been started on palliative immunotherapy.  She gets chemotherapy through a port every other week.  She just had her third round.  She will be getting her next round of chemotherapy on April 26.  She has seen bright red blood per rectum 2 or 3 times over the past few months.  One of her caregivers checked her for microscopic blood and she tells me that it was also positive.  Otherwise she does not have any issues with her bowels that are new.  She is still bothered by IBS-like symptoms which have plagued her for years.  They are not changed.  She does not have any significant abdominal pains.  Her weight is overall stable.  Review of systems: Pertinent positive and negative review of systems were noted in the above HPI section. All other review negative.   Past Medical History:  Diagnosis Date  . Anxiety   . Benign positional vertigo   . Cancer ([place]) 02/09/15   intrahepatic cholangiocarcinoma  . COPD (chronic obstructive pulmonary disease) ([place])   . Depression   . Diabetes mellitus without complication ([place])   .  Early cataracts, bilateral 10/13   Optho, Dr Gershon Crane  . Echocardiogram  findings abnormal, without diagnosis 10/10   10/10: mild pulm HTN, EF 60-65%, mild LVH, moderate aortic regurg  . GERD (gastroesophageal reflux disease)    I have "acid reflux"  . Gout   . HLD (hyperlipidemia)   . HTN, goal below 130/80   . Irritable bowel syndrome   . Obesity, Class III, BMI 40-49.9 (morbid obesity) ([place])   . Obstructive sleep apnea    wears cpap  . Osteoarthritis (arthritis due to wear and tear of joints)    also gout  . Restrictive lung disease     Past Surgical History:  Procedure Laterality Date  . ABDOMINAL HYSTERECTOMY    . ESOPHAGOGASTRODUODENOSCOPY N/A 06/15/2015   Procedure: ESOPHAGOGASTRODUODENOSCOPY (EGD);  Surgeon: Gatha Mayer, MD;  Location: [facility];  Service: Endoscopy;  Laterality: N/A;  . IR IMAGING GUIDED PORT INSERTION  06/15/2019  . IR REMOVAL TUN ACCESS W/ PORT W/O FL MOD SED  11/11/2016  . LIVER BIOPSY    . OPEN PARTIAL HEPATECTOMY  Left 02/09/15  . PARTIAL HYSTERECTOMY    . ROTATOR CUFF REPAIR       L rotator cuff repair 11/07-[place] - 12/7/200  . TONSILLECTOMY      Current Outpatient Medications  Medication Sig Dispense Refill  . albuterol (PROAIR HFA) 108 (90 Base) MCG/ACT inhaler INHALE 2 PUFFS INTO THE LUNGS EVERY 4 HOURS AS NEEDED FOR WHEEZING OR SHORTNESS OF BREATH 1 Inhaler 0  . alendronate (FOSAMAX) 70 MG tablet TAKE 1 TABLET  ONCE A WEEK. 12 tablet 2  . amLODipine (NORVASC) 10 MG tablet TAKE 1 TABLET EVERY DAY 90 tablet 1  . atenolol (TENORMIN) 25 MG tablet Take 1 tablet (25 mg total) by mouth daily. 90 tablet 3  . cholecalciferol (VITAMIN D) 1000 units tablet Take 1 tablet (1,000 Units total) by mouth daily. 30 tablet 11  . diclofenac sodium (VOLTAREN) 1 % GEL Apply 2 g topically 4 (four) times daily as needed. 100 g 4  . famotidine (PEPCID) 20 MG tablet Take 1 tablet (20 mg total) by mouth 2 (two) times daily. 180 tablet 3  . fish oil-omega-3 fatty acids 1000 MG capsule Take 1 g by mouth every 30 (thirty) days.     .  fluticasone (FLONASE) 50 MCG/ACT nasal spray Place 2 sprays into both nostrils daily as needed for rhinitis. 16 g 2  . hydrochlorothiazide (HYDRODIURIL) 25 MG tablet Take 1 tablet (25 mg total) by mouth daily. 90 tablet 3  . Hyprom-Naphaz-Polysorb-Zn Sulf (CLEAR EYES COMPLETE OP) Place 1 drop into both eyes daily as needed (dry eyes).     [name] lidocaine-prilocaine (EMLA) cream Apply 1 application topically every 14 (fourteen) days. Every 14 days as needed 30 g 2  . lisinopril (ZESTRIL) 40 MG tablet Take 1 tablet (40 mg total) by mouth daily. 90 tablet 3  . loratadine (CLARITIN) 10 MG tablet Take 1 tablet (10 mg total) by mouth daily as needed for allergies. 30 tablet 3  . LORazepam (ATIVAN) 0.5 MG tablet Take 0.5-1 tablets (0.25-0.5 mg total) by mouth at bedtime as needed for anxiety. 20 tablet 0  . metFORMIN (GLUCOPHAGE) 500 MG tablet TAKE 2 TABLETS TWICE DAILY WITH MEALS 360 tablet 3  . mirtazapine (REMERON SOL-TAB) 15 MG disintegrating tablet Take 1 tablet (15 mg total) by mouth at bedtime. 30 tablet 2  . Multiple Vitamin (MULTIVITAMIN WITH MINERALS) TABS tablet Take 1 tablet by mouth daily.    [name]  VITAMIN E PO Take 1 tablet by mouth daily.      No current facility-administered medications for this visit.    Allergies as of 07/21/2019 - Review Complete 07/21/2019  Allergen Reaction Noted  . Other  09/29/2014    Family History  Problem Relation Age of Onset  . Breast cancer Mother   . Hypertension Mother   . Coronary artery disease Mother   . Diabetes type II Mother   . Rheum arthritis Mother   . Diabetes type II Sister   . Pancreatic cancer Sister     Social History   Socioeconomic History  . Marital status: Divorced    Spouse name: Not on file  . Number of children: 4  . Years of education: Not on file  . Highest education level: Not on file  Occupational History  . Occupation: unemployed  Tobacco Use  . Smoking status: Former Smoker    Packs/day: 0.50    Years: 49.00     Pack years: 24.50    Types: Cigarettes    Quit date: 04/13/2018    Years since quitting: 1.2  . Smokeless tobacco: Never Used  Substance and Sexual Activity  . Alcohol use: Yes    Alcohol/week: 0.0 standard drinks    Comment: occasionally  . Drug use: Yes    Types: Marijuana  . Sexual activity: Never  Other Topics Concern  . Not on file  Social History Narrative   Single, lives alone in apartment at [facility]   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 Difficulty of Paying Living Expenses:   Food Insecurity:   . Worried About Charity fundraiser in the Last Year:   . Arboriculturist in the Last Year:   Transportation Needs:   . Film/video editor (Medical):   [name] Lack of Transportation (Non-Medical):   Physical Activity:   . Days of Exercise per Week:   . Minutes of Exercise per Session:   Stress:   . Feeling of Stress :   Social Connections:   . Frequency of Communication with Friends and Family:   . Frequency of Social Gatherings with Friends and Family:   . Attends Religious Services:   . Active Member of Clubs or Organizations:   . Attends Archivist Meetings:   [name] Marital Status:   Intimate Partner Violence:   . Fear of Current or Ex-Partner:   . Emotionally Abused:   [name] Physically Abused:   . Sexually Abused:      Physical Exam: BP (!) 160/60   Pulse 64   Temp 98.1 F (36.7 C)   Ht 5' 2"  (1.575 m)   Wt 193 lb (87.5 kg)   BMI 35.30 kg/m  Constitutional: generally well-appearing Psychiatric: alert and oriented x3 Eyes: extraocular movements intact Mouth: oral pharynx moist, no lesions Neck: supple no lymphadenopathy Cardiovascular: heart regular rate and rhythm Lungs:  clear to auscultation bilaterally Abdomen: soft, nontender, nondistended, no obvious ascites, no peritoneal signs, normal bowel sounds Extremities: no lower extremity edema bilaterally Skin: no lesions on visible extremities   Assessment and plan: 74 y.o. female with Hemoccult positive stool, bright red blood per rectum, personal  history of adenomatous colon polyps, known metastatic intrahepatic cholangiocarcinoma  I told her that in order to evaluate the microscopic and intermittent overt pressure blood per rectum would require a colonoscopy.  She would like to go ahead with that as she is worried that there might be something serious going on.  We are going to try to time colonoscopy so that it is just 2 or 3 days prior to her every 2-week chemotherapy.  We will also have CBC checked 2 or 3 days before the scheduled colonoscopy to check her blood counts make sure it is safe to proceed.  I see no reason for any further blood tests or imaging studies prior to then.  Current plan is colonoscopy May 6 on a Thursday at [facility], CBC Monday may third  Please see the "Patient Instructions" section for addition details about the plan.   Owens Loffler, MD [facility] 07/21/2019, 3:57 PM  Cc: Lattie Haw, MD  Total time on date of encounter was 45  minutes (this included time spent preparing to see the patient reviewing records; obtaining and/or reviewing separately obtained history; performing a medically appropriate exam and/or evaluation; counseling and educating the patient and family if present; ordering medications, tests or procedures if applicable; and documenting clinical information in the health record).

## 2019-07-21 NOTE — Patient Instructions (Addendum)
If you are age 73 or older, your body mass index should be between 23-30. Your Body mass index is 35.3 kg/m. If this is out of the aforementioned range listed, please consider follow up with your Primary Care Provider.  If you are age 48 or younger, your body mass index should be between 19-25. Your Body mass index is 35.3 kg/m. If this is out of the aformentioned range listed, please consider follow up with your Primary Care Provider.   You have been scheduled for a colonoscopy. Please follow written instructions given to you at your visit today.  Please pick up your prep supplies at the pharmacy within the next 1-3 days. If you use inhalers (even only as needed), please bring them with you on the day of your procedure.  Your provider has requested that you go to the basement level for lab work on Monday Aug 09, 2019.  Press "B" on the elevator. The lab is located at the first door on the left as you exit the elevator.  Due to recent changes in healthcare laws, you may see the results of your imaging and laboratory studies on MyChart before your provider has had a chance to review them.  We understand that in some cases there may be results that are confusing or concerning to you. Not all laboratory results come back in the same time frame and the provider may be waiting for multiple results in order to interpret others.  Please give [place] 48 hours in order for your provider to thoroughly review all the results before contacting the office for clarification of your results.   Due to recent COVID-19 restrictions implemented by our local and state authorities and in an effort to keep both patients and staff as safe as possible, our hospital system now requires COVID-19 testing prior to any scheduled hospital procedure. Please go to our [facility] location drive thru testing site ([address] [place], [place] [address]) on 08-09-19 at  10:20am. There will be multiple testing areas, the first  checkpoint being for pre-procedure/surgery testing. Get into the right (yellow) lane that leads to the PAT testing team. You will not be billed at the time of testing but may receive a bill later depending on your insurance. The approximate cost of the test is $100. You must agree to quarantine from the time of your testing until the procedure date on 08-12-19 . This should include staying at home with ONLY the people you live with. Avoid take-out, grocery store shopping or leaving the house for any non-emergent reason. Failure to have your COVID-19 test done on the date and time you have been scheduled will result in cancellation of procedure. Please call our office at [phone] if you have any questions.   Thank you for entrusting me with your care and choosing [facility].  Dr [name]

## 2019-07-21 NOTE — H&P (View-Only) (Signed)
Review of pertinent gastrointestinal problems: 1. 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 CT scan of the abdomen and pelvis which also shows a 3.7 x 2.1 x 2.4 cm arterial enhancing lesion. there were no additional hepatic masses identified hepatic and portal vein patent, spleen and pancreas grossly unremarkable, question possible mild cirrhotic changes  Hepatic panel was last done on 02/10/2013 and was normal ProTime 12.8 INR of 0.9 hepatitis serologies were negative with the exception of hepatitis B surface antibody positive. Alpha-fetoprotein and CEA were normal. 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 Slightly enlarged from 2.3 x 2.2 cm on the prior exam (when remeasured). Planning to repeat MRI in 1 year. Repeat MRI 2016 suggested a liver lesion had increased in size. Repeat biopsies showed atypical cells. Second opinion at [facility] felt that this was cholangiocarcinoma and she underwent hepatic resection.  02/09/15 L lobe of liver partial hepatectomy segments 2, 3, 4a, 4b invasive cholangiocarcinoma, moderately differentiated. Tumor focally extends to the parenchymal margin of resection. Uninvolved liver parenchyma with steatohepatitis and associated periportal and centrilobular pericellular fibrosis. Scattered von Meyenburg complexes. One hepatic artery LN with metastatic cholangiocarcinoma. Fibroadipose tissue and nerve negative for malignancy. Gallbladder negative for malignancy. One LN negative for malignancy. T2aN1 invasive cholangiocarcinoma, moderately differentiated, with positive hepatic parenchymal margin, and 1/2 nodes positive for tumor involvement.  Sees oncology at [facility]  as well as [name]  As of oncology notes March 2021 she was felt to have metastatic incurable intrahepatic cholangiocarcinoma.  She was on palliative chemotherapy. 2. Adenomatous colon polyps: Colonsocopy [name] 06/2013 for minor rectal bleeding: two small TAs removed (recall 5 years) 3. 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 4. 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 EGD Dr. Carlean Purl 06/2015 gastritis, pyloric stenosis felt from edema (incomplete), path c/w radiation damage.   HPI: This is a very pleasant 74 year old woman whom I last saw about 4 years ago.  Unfortunately her intrahepatic cholangiocarcinoma has proven to be a metastatic process and she has recently been started on palliative immunotherapy.  She gets chemotherapy through a port every other week.  She just had her third round.  She will be getting her next round of chemotherapy on April 26.  She has seen bright red blood per rectum 2 or 3 times over the past few months.  One of her caregivers checked her for microscopic blood and she tells me that it was also positive.  Otherwise she does not have any issues with her bowels that are new.  She is still bothered by IBS-like symptoms which have plagued her for years.  They are not changed.  She does not have any significant abdominal pains.  Her weight is overall stable.  Review of systems: Pertinent positive and negative review of systems were noted in the above HPI section. All other review negative.   Past Medical History:  Diagnosis Date  . Anxiety   . Benign positional vertigo   . Cancer ([place]) 02/09/15   intrahepatic cholangiocarcinoma  . COPD (chronic obstructive pulmonary disease) ([place])   . Depression   . Diabetes mellitus without complication ([place])   .  Early cataracts, bilateral 10/13   Optho, Dr Gershon Crane  . Echocardiogram  findings abnormal, without diagnosis 10/10   10/10: mild pulm HTN, EF 60-65%, mild LVH, moderate aortic regurg  . GERD (gastroesophageal reflux disease)    I have "acid reflux"  . Gout   . HLD (hyperlipidemia)   . HTN, goal below 130/80   . Irritable bowel syndrome   . Obesity, Class III, BMI 40-49.9 (morbid obesity) ([place])   . Obstructive sleep apnea    wears cpap  . Osteoarthritis (arthritis due to wear and tear of joints)    also gout  . Restrictive lung disease     Past Surgical History:  Procedure Laterality Date  . ABDOMINAL HYSTERECTOMY    . ESOPHAGOGASTRODUODENOSCOPY N/A 06/15/2015   Procedure: ESOPHAGOGASTRODUODENOSCOPY (EGD);  Surgeon: Gatha Mayer, MD;  Location: [facility];  Service: Endoscopy;  Laterality: N/A;  . IR IMAGING GUIDED PORT INSERTION  06/15/2019  . IR REMOVAL TUN ACCESS W/ PORT W/O FL MOD SED  11/11/2016  . LIVER BIOPSY    . OPEN PARTIAL HEPATECTOMY  Left 02/09/15  . PARTIAL HYSTERECTOMY    . ROTATOR CUFF REPAIR       L rotator cuff repair 11/07-[place] - 12/7/200  . TONSILLECTOMY      Current Outpatient Medications  Medication Sig Dispense Refill  . albuterol (PROAIR HFA) 108 (90 Base) MCG/ACT inhaler INHALE 2 PUFFS INTO THE LUNGS EVERY 4 HOURS AS NEEDED FOR WHEEZING OR SHORTNESS OF BREATH 1 Inhaler 0  . alendronate (FOSAMAX) 70 MG tablet TAKE 1 TABLET  ONCE A WEEK. 12 tablet 2  . amLODipine (NORVASC) 10 MG tablet TAKE 1 TABLET EVERY DAY 90 tablet 1  . atenolol (TENORMIN) 25 MG tablet Take 1 tablet (25 mg total) by mouth daily. 90 tablet 3  . cholecalciferol (VITAMIN D) 1000 units tablet Take 1 tablet (1,000 Units total) by mouth daily. 30 tablet 11  . diclofenac sodium (VOLTAREN) 1 % GEL Apply 2 g topically 4 (four) times daily as needed. 100 g 4  . famotidine (PEPCID) 20 MG tablet Take 1 tablet (20 mg total) by mouth 2 (two) times daily. 180 tablet 3  . fish oil-omega-3 fatty acids 1000 MG capsule Take 1 g by mouth every 30 (thirty) days.     .  fluticasone (FLONASE) 50 MCG/ACT nasal spray Place 2 sprays into both nostrils daily as needed for rhinitis. 16 g 2  . hydrochlorothiazide (HYDRODIURIL) 25 MG tablet Take 1 tablet (25 mg total) by mouth daily. 90 tablet 3  . Hyprom-Naphaz-Polysorb-Zn Sulf (CLEAR EYES COMPLETE OP) Place 1 drop into both eyes daily as needed (dry eyes).     [name] lidocaine-prilocaine (EMLA) cream Apply 1 application topically every 14 (fourteen) days. Every 14 days as needed 30 g 2  . lisinopril (ZESTRIL) 40 MG tablet Take 1 tablet (40 mg total) by mouth daily. 90 tablet 3  . loratadine (CLARITIN) 10 MG tablet Take 1 tablet (10 mg total) by mouth daily as needed for allergies. 30 tablet 3  . LORazepam (ATIVAN) 0.5 MG tablet Take 0.5-1 tablets (0.25-0.5 mg total) by mouth at bedtime as needed for anxiety. 20 tablet 0  . metFORMIN (GLUCOPHAGE) 500 MG tablet TAKE 2 TABLETS TWICE DAILY WITH MEALS 360 tablet 3  . mirtazapine (REMERON SOL-TAB) 15 MG disintegrating tablet Take 1 tablet (15 mg total) by mouth at bedtime. 30 tablet 2  . Multiple Vitamin (MULTIVITAMIN WITH MINERALS) TABS tablet Take 1 tablet by mouth daily.    [name]  VITAMIN E PO Take 1 tablet by mouth daily.      No current facility-administered medications for this visit.    Allergies as of 07/21/2019 - Review Complete 07/21/2019  Allergen Reaction Noted  . Other  09/29/2014    Family History  Problem Relation Age of Onset  . Breast cancer Mother   . Hypertension Mother   . Coronary artery disease Mother   . Diabetes type II Mother   . Rheum arthritis Mother   . Diabetes type II Sister   . Pancreatic cancer Sister     Social History   Socioeconomic History  . Marital status: Divorced    Spouse name: Not on file  . Number of children: 4  . Years of education: Not on file  . Highest education level: Not on file  Occupational History  . Occupation: unemployed  Tobacco Use  . Smoking status: Former Smoker    Packs/day: 0.50    Years: 49.00     Pack years: 24.50    Types: Cigarettes    Quit date: 04/13/2018    Years since quitting: 1.2  . Smokeless tobacco: Never Used  Substance and Sexual Activity  . Alcohol use: Yes    Alcohol/week: 0.0 standard drinks    Comment: occasionally  . Drug use: Yes    Types: Marijuana  . Sexual activity: Never  Other Topics Concern  . 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 Difficulty of Paying Living Expenses:   Food Insecurity:   . Worried About Charity fundraiser in the Last Year:   . Arboriculturist in the Last Year:   Transportation Needs:   . Film/video editor (Medical):   [name] Lack of Transportation (Non-Medical):   Physical Activity:   . Days of Exercise per Week:   . Minutes of Exercise per Session:   Stress:   . Feeling of Stress :   Social Connections:   . Frequency of Communication with Friends and Family:   . Frequency of Social Gatherings with Friends and Family:   . Attends Religious Services:   . Active Member of Clubs or Organizations:   . Attends Archivist Meetings:   [name] Marital Status:   Intimate Partner Violence:   . Fear of Current or Ex-Partner:   . Emotionally Abused:   [name] Physically Abused:   . Sexually Abused:      Physical Exam: BP (!) 160/60   Pulse 64   Temp 98.1 F (36.7 C)   Ht 5' 2"  (1.575 m)   Wt 193 lb (87.5 kg)   BMI 35.30 kg/m  Constitutional: generally well-appearing Psychiatric: alert and oriented x3 Eyes: extraocular movements intact Mouth: oral pharynx moist, no lesions Neck: supple no lymphadenopathy Cardiovascular: heart regular rate and rhythm Lungs:  clear to auscultation bilaterally Abdomen: soft, nontender, nondistended, no obvious ascites, no peritoneal signs, normal bowel sounds Extremities: no lower extremity edema bilaterally Skin: no lesions on visible extremities   Assessment and plan: 74 y.o. female with Hemoccult positive stool, bright red blood per rectum, personal  history of adenomatous colon polyps, known metastatic intrahepatic cholangiocarcinoma  I told her that in order to evaluate the microscopic and intermittent overt pressure blood per rectum would require a colonoscopy.  She would like to go ahead with that as she is worried that there might be something serious going on.  We are going to try to time colonoscopy so that it is just 2 or 3 days prior to her every 2-week chemotherapy.  We will also have CBC checked 2 or 3 days before the scheduled colonoscopy to check her blood counts make sure it is safe to proceed.  I see no reason for any further blood tests or imaging studies prior to then.  Current plan is colonoscopy May 6 on a Thursday at [facility], CBC Monday may third  Please see the "Patient Instructions" section for addition details about the plan.   Owens Loffler, MD [facility] 07/21/2019, 3:57 PM  Cc: Lattie Haw, MD  Total time on date of encounter was 45  minutes (this included time spent preparing to see the patient reviewing records; obtaining and/or reviewing separately obtained history; performing a medically appropriate exam and/or evaluation; counseling and educating the patient and family if present; ordering medications, tests or procedures if applicable; and documenting clinical information in the health record).

## 2019-07-22 ENCOUNTER — Ambulatory Visit (INDEPENDENT_AMBULATORY_CARE_PROVIDER_SITE_OTHER): Payer: Medicare HMO | Admitting: Adult Health

## 2019-07-22 ENCOUNTER — Ambulatory Visit: Payer: Medicare HMO | Admitting: Adult Health

## 2019-07-22 DIAGNOSIS — J449 Chronic obstructive pulmonary disease, unspecified: Secondary | ICD-10-CM | POA: Diagnosis not present

## 2019-07-22 NOTE — Progress Notes (Signed)
Virtual Visit via Telephone Note  I connected with Bonnie Peterson on 07/22/19 at  3:30 PM EDT by telephone and verified that I am speaking with the correct person using two identifiers.  Location: Patient: Home  Provider: Home    I discussed the limitations, risks, security and privacy concerns of performing an evaluation and management service by telephone and the availability of in person appointments. I also discussed with the patient that there may be a patient responsible charge related to this service. The patient expressed understanding and agreed to proceed.   History of Present Illness: 74 year old female former smoker-quit January 2020 followed for moderate COPD and lung nodules. Medical history significant for liver cancer followed at [facility] status post resection, chemo/radiation-patient is currently on active treatment Previous diagnosis of obstructive sleep apnea on CPAP.  Discontinued in 2017 after weight loss Today's televisit is a 49-monthfollow-up for COPD.  Patient says overall she is doing okay with her breathing.  She is had no flare of cough or wheezing.  She is back on chemo for her underlying liver cancer.  She says her energy level is low.  She would like a refill of her ProAir.  She says she is had rare use but her inhaler is expired.  She does have some chronic rhinitis is using Flonase and Claritin.  She denies any chest pain orthopnea PND or increased leg swelling.  Observations/Objective: Speaks in full sentences with no audible wheezing or distress   Spirometry 2/5/2018FEV1 1.03 (58%) Ratio 70 mild curvature  NPSG 2010: AHI 15/hr with desat to 75% Pt d/c'd cpap 04/2015 with wt loss >[place] 05/13/2016= 6 off cpap  CT chest 04/2018 >stablepulmonary nodules,slight increase in right hilar lymph node 1.2 cm no other lymphadenopathy. Tree-in-bud nodularity in the right upper lobe with mucous plugging.  CT chest 4/2019Emphysema with bronchial  wall thickening noted bilaterally. New 5 mm subpleural/perifissural nodule noted anterior right lun 6 mm posterior right lower lobe pulmonary nodule identifiedpreviously is stable on image 79/11. 4 mm right lower lobe nodule is unchanged.  10/2017Emphysema as before. 7 x 7 mm posterior right lower lobe pulmonary nodule is now 7 x 6 mm (image 70 series 4). Linear scarring right middle lobe is unchanged  Assessment and Plan: COPD currently stable without flare.  Plan  Patient Instructions  Mucinex DM Twice daily  As needed  Cough/congestion .  ProAir inhaler 2 puffs every 4hrs as needed for wheezing .  Follow up in 6 months Dr. WMelvyn Novasand As needed   Please contact office for sooner follow up if symptoms do not improve or worsen or seek emergency care       Follow Up Instructions: Follow-up in 6 months and as needed   I discussed the assessment and treatment plan with the patient. The patient was provided an opportunity to ask questions and all were answered. The patient agreed with the plan and demonstrated an understanding of the instructions.   The patient was advised to call back or seek an in-person evaluation if the symptoms worsen or if the condition fails to improve as anticipated.  I provided 22 minutes of non-face-to-face time during this encounter.   TRexene Edison NP

## 2019-07-23 MED ORDER — ALBUTEROL SULFATE HFA 108 (90 BASE) MCG/ACT IN AERS: 1.0000 | INHALATION_SPRAY | Freq: Four times a day (QID) | RESPIRATORY_TRACT | 3 refills | Status: AC | PRN

## 2019-07-23 NOTE — Patient Instructions (Signed)
Mucinex DM Twice daily  As needed  Cough/congestion .  ProAir inhaler 2 puffs every 4hrs as needed for wheezing .  Follow up in 6 months Dr. Melvyn Novas and As needed   Please contact office for sooner follow up if symptoms do not improve or worsen or seek emergency care

## 2019-07-27 NOTE — Progress Notes (Signed)
Pharmacist Chemotherapy Monitoring - Follow Up Assessment    I verify that I have reviewed each item in the below checklist:  . Regimen for the patient is scheduled for the appropriate day and plan matches scheduled date. [name] Appropriate non-routine labs are ordered dependent on drug ordered. . If applicable, additional medications reviewed and ordered per protocol based on lifetime cumulative doses and/or treatment regimen.   Plan for follow-up and/or issues identified: No . I-vent associated with next due treatment: No . MD and/or nursing notified: No  Romualdo Bolk [facility] 07/27/2019 1:58 PM

## 2019-07-28 NOTE — Progress Notes (Signed)
[place]   [phone] [phone]   Clinic Follow up Note   Patient Care Team: Lattie Haw, MD as PCP - General Melancon, York Ram, MD (Inactive) as Resident (Family Medicine) Jonnie Finner, RN as Oncology Nurse Navigator Jonnie Finner, RN as Oncology Nurse Navigator  Date of Service:  08/02/2019  CHIEF COMPLAINT:  F/u forMetastaticCholangiocarcinoma  SUMMARY OF ONCOLOGIC HISTORY: Oncology History  Intrahepatic cholangiocarcinoma ([place])  2014 Initial Diagnosis   Intrahepatic cholangiocarcinoma ([place])   2014 Initial Diagnosis   Per notes from [facility] by Dr. Mettu 1. 2014 Mass in the left side of the liver incidentally found during work up for acute pancreatitis.  The mass was thought to be a bile duct lesion and was followed up.  2. 02/2013 MRI hyperdense focus within the left hepatic lobe.  3. 02/21/13 CT scan 3.7 x 2.1 x 2.4 cm arterial enhancing lesion.  AFP and CEA were normal.  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 4. 05/18/12 Biopsy of something (surg path in Care Everywhere, but no report) 5. 07/02/13 Biopsy of something (surg path in Care Everywhere, but no report) 6. 09/02/13 MRI lesion in segment 4A with adjacent perfusional enhancement with some washout. Lesion is close to left portal vein. 7. 09/15/14 MRI segment 4A lesion slightly increased in size, minimal fat containing, peripheral enhancement and arterial enhancement. Left portal vein is clearly not involved.   02/19/2015 Surgery   Per notes from [facility] by Dr. Dennison Nancy L lobe of liver partial hepatectomy segments 2, 3, 4a, 4b invasive cholangiocarcinoma, moderately differentiated. Tumor focally extends to the parenchymal margin of resection. Uninvolved liver parenchyma with steatohepatitis and associated periportal and centrilobular pericellular fibrosis. Scattered von Meyenburg complexes. One hepatic artery LN with metastatic cholangiocarcinoma.  Fibroadipose tissue and nerve negative for malignancy. Gallbladder negative for malignancy. One LN negative for malignancy. T2aN1 invasive cholangiocarcinoma, moderately differentiated, with positive hepatic parenchymal margin, and 1/2 nodes positive for tumor involvement. -Post operative course was complicated by fevers felt to be related to atelectasis   04/17/2015 Concurrent Chemotherapy   Per notes from [facility] by Dr. Dennison Nancy -04/17/15-05/26/15 Chemoradiation with capecitabine (locally) 50.4 Gy in 28 fractions.  -Patient started getting sick, hospitalized, fluids twice, was on ondansetron, and propranolol, and sucralfate  -EGD found ulcers in stomach  -06/14/15 CTA (per Dr. Shayne Alken note) no evidence of disease recurrence.  Formation of the stomach and duodenum was seen.   08/2015 - 2017 Chemotherapy   08/2015 4 cycles of gemcitabine.  Patient intolerant to this treatment with weight loss, anorexia, and fatigue.    10/16/2015 Surgery   Per notes from [facility] by Dr. Dennison Nancy -CTA chest abdomen and pelvis status post left hepatectomy with no evidence of recurrent disease or metastatic disease.  Right lower lobe pulmonary nodules unchanged.  CA 19-9 7   08/11/2017 Pathology Results   Per notes from [facility] by Dr. Dennison Nancy -07/29/17 CT abd -U/S biopsy of liver    08/27/2017 Imaging   Per note from [facility] by Dr. Dennison Nancy --08/25/17 CT chest, abdomen, and pelvis ill-defined hypoenhancing lesion in the inferior right liver concerning for m metastatic disease. Portocaval node measuring up to 1.2 cm in short axis   08/28/2017 Pathology Results   Per note from [facility] by Dr. Dennison Nancy -Invasive moderate Truman Hayward differentiated adenocarcinoma morphologically compatible with cholangio- -09/02/17 MDC consensus appears consistent with intrahepatic cholangiocarcinoma and surgical resection not appropriate.  If after systemic therapy she remains stable, no extrahepatic disease,  could consider HAI pump in future.  Ablation not an option.     09/24/2017 - 10/15/2017 Radiation Therapy   Per note from [facility] by Dr. Dennison Nancy -XRT x3 weeks   12/30/2017 Imaging   Per note from [facility] by Dr. Dennison Nancy CT chest, abdomen, and pelvis slight interval increase in size of metastatic lesion in the liver with increasing necrosis.  Stable right lower lobe pulmonary nodule and portacaval lymph nodes   10/14/2018 Imaging   Per note from [facility] by Dr. Dennison Nancy CT chest, abdomen, and pelvis decrease size of liver lesions, PVT new, 1 to centimeter lymph node   05/14/2019 Imaging   Per note from [facility] by Dr. Dennison Nancy -CT infiltrative mass within the porta hepatis encasing the portal triads measuring up to 4.1 cm, concerning for residual/recurrent cholangiocarcinoma.  There is worsening thrombosis/tumor in the vein involving the entire portal venous system now extending into the right portal vein and distal aspect of the splenic vein.  There are at least 6 new liver lesions concerning for multifocal hepatic metastases.  Stable retroperitoneal lymphadenopathy.  Similar appearing distal gastric wall thickening, likely post radiation changes.  Stable subcentimeter pulmonary nodules, the largest within the right lower lobe measuring 0.8 cm.  Recommend continued attention to follow-up.   05/14/2019 Cancer Staging   Staging form: Intrahepatic Bile Duct, AJCC 8th Edition - Clinical stage from 05/14/2019: Stage IV (cTX, cNX, cM1) - Signed by Truitt Merle, MD on 06/20/2019   06/21/2019 -  Chemotherapy   First-line 5-fluorouracil and leucovorin IV every 2 weeks starting 06/21/19      CURRENT THERAPY:  First-line5-fluorouracil and leucovorin IV every 2 weeksstarting 06/21/19   INTERVAL HISTORY:  Bonnie Peterson is here for a follow up and treatment. She presents to the clinic alone. She notes she has been getting more sick with chemo and would like to reduce chemo dose and frequency. She notes her hair is falling out and her skin is darkening and skin peeling. She notes she has not been  cooking and has not been eating well. She notes she took an anxiety medication last night to sleep. She denies nausea. She notes bone pain and uses Motrin cream. She notes she uses 6 boost in a week. She notes before cancer her weight was 218 pounds. She notes she has dry eyes of her right eyes. She also notes she has been fatigued. She notes she has diarrhea and may have stool incontinence. She notes she has 3 remaining children who live in [place] who offered to move her in if she needs more help. She does not want to move in with anyone of her family members and rather have help from social services. She notes her son is her POA and has not set up living will. She notes she does not want resuscitation or life support.     REVIEW OF SYSTEMS:   Constitutional: Denies fevers, chills (+) Lower appetite, weight loss (+) Right dry eye (+) Fatigue  Eyes: Denies blurriness of vision Ears, nose, mouth, throat, and face: Denies mucositis or sore throat Respiratory: Denies cough, dyspnea or wheezes Cardiovascular: Denies palpitation, chest discomfort or lower extremity swelling Gastrointestinal:  Denies nausea, heartburn (+) Diarrhea with occasional stool incontinence  Skin: (+) Skin darkening and peeling  Lymphatics: Denies new lymphadenopathy or easy bruising Neurological:Denies numbness, tingling or new weaknesses Behavioral/Psych: Mood is stable, no new changes  All other systems were reviewed with the patient and are negative.  MEDICAL HISTORY:  Past Medical History:  Diagnosis Date  . Anxiety   . Benign positional vertigo   . Cancer ([place]) 02/09/15   intrahepatic cholangiocarcinoma  . COPD (chronic obstructive pulmonary disease) ([place])   . Depression   . Diabetes mellitus without complication ([place])   . Early cataracts, bilateral 10/13   Optho, Dr Gershon Crane  . Echocardiogram findings abnormal, without diagnosis 10/10   10/10: mild pulm HTN, EF 60-65%, mild LVH, moderate aortic regurg  . GERD  (gastroesophageal reflux disease)    I have "acid reflux"  . Gout   . HLD (hyperlipidemia)   . HTN, goal below 130/80   . Irritable bowel syndrome   . Obesity, Class III, BMI 40-49.9 (morbid obesity) ([place])   . Obstructive sleep apnea    wears cpap  . Osteoarthritis (arthritis due to wear and tear of joints)    also gout  . Restrictive lung disease     SURGICAL HISTORY: Past Surgical History:  Procedure Laterality Date  . ABDOMINAL HYSTERECTOMY    . ESOPHAGOGASTRODUODENOSCOPY N/A 06/15/2015   Procedure: ESOPHAGOGASTRODUODENOSCOPY (EGD);  Surgeon: Gatha Mayer, MD;  Location: [facility];  Service: Endoscopy;  Laterality: N/A;  . IR IMAGING GUIDED PORT INSERTION  06/15/2019  . IR REMOVAL TUN ACCESS W/ PORT W/O FL MOD SED  11/11/2016  . LIVER BIOPSY    . OPEN PARTIAL HEPATECTOMY  Left 02/09/15  . PARTIAL HYSTERECTOMY    . ROTATOR CUFF REPAIR       L rotator cuff repair 11/07-[place] - 12/7/200  . TONSILLECTOMY      I have reviewed the social history and family history with the patient and they are unchanged from previous note.  ALLERGIES:  is allergic to other.  MEDICATIONS:  Current Outpatient Medications  Medication Sig Dispense Refill  . albuterol (PROAIR HFA) 108 (90 Base) MCG/ACT inhaler Inhale 1-2 puffs into the lungs every 6 (six) hours as needed for wheezing or shortness of breath. 1 g 3  . alendronate (FOSAMAX) 70 MG tablet TAKE 1 TABLET  ONCE A WEEK. (Patient taking differently: Take 70 mg by mouth every Sunday. TAKE 1 TABLET  ONCE A WEEK.) 12 tablet 2  . amLODipine (NORVASC) 10 MG tablet TAKE 1 TABLET EVERY DAY (Patient taking differently: Take 10 mg by mouth daily. ) 90 tablet 1  . atenolol (TENORMIN) 25 MG tablet Take 1 tablet (25 mg total) by mouth daily. 90 tablet 3  . cholecalciferol (VITAMIN D) 1000 units tablet Take 1 tablet (1,000 Units total) by mouth daily. 30 tablet 11  . diclofenac sodium (VOLTAREN) 1 % GEL Apply 2 g topically 4 (four) times daily as needed.  100 g 4  . famotidine (PEPCID) 20 MG tablet Take 1 tablet (20 mg total) by mouth 2 (two) times daily. 180 tablet 3  . fish oil-omega-3 fatty acids 1000 MG capsule Take 1 g by mouth every 30 (thirty) days.     . fluticasone (FLONASE) 50 MCG/ACT nasal spray Place 2 sprays into both nostrils daily as needed for rhinitis. 16 g 2  . hydrochlorothiazide (HYDRODIURIL) 25 MG tablet Take 1 tablet (25 mg total) by mouth daily. 90 tablet 3  . Hyprom-Naphaz-Polysorb-Zn Sulf (CLEAR EYES COMPLETE OP) Place 1 drop into both eyes daily as needed (dry eyes).     [name] lidocaine-prilocaine (EMLA) cream Apply 1 application topically every 14 (fourteen) days. Every 14 days as needed (Patient taking differently: Apply 1 application topically as needed (port access). ) 30 g 2  . lisinopril (ZESTRIL) 40 MG tablet  Take 1 tablet (40 mg total) by mouth daily. 90 tablet 3  . loratadine (CLARITIN) 10 MG tablet Take 1 tablet (10 mg total) by mouth daily as needed for allergies. 30 tablet 3  . LORazepam (ATIVAN) 0.5 MG tablet Take 0.5-1 tablets (0.25-0.5 mg total) by mouth at bedtime as needed for anxiety. (Patient taking differently: Take 0.5 mg by mouth at bedtime as needed for anxiety. ) 20 tablet 0  . metFORMIN (GLUCOPHAGE) 500 MG tablet TAKE 2 TABLETS TWICE DAILY WITH MEALS (Patient taking differently: Take 1,000 mg by mouth 2 (two) times daily with a meal. TAKE 2 TABLETS TWICE DAILY WITH MEALS) 360 tablet 3  . mirtazapine (REMERON SOL-TAB) 15 MG disintegrating tablet Take 1 tablet (15 mg total) by mouth at bedtime. (Patient not taking: Reported on 07/30/2019) 30 tablet 2  . Multiple Vitamin (MULTIVITAMIN WITH MINERALS) TABS tablet Take 1 tablet by mouth daily.    . Na Sulfate-K Sulfate-Mg Sulf (SUPREP BOWEL PREP KIT) 17.5-3.13-1.6 GM/177ML SOLN Take 1 kit by mouth as directed. 324 mL 0  . ondansetron (ZOFRAN) 4 MG tablet Take 4 mg by mouth every 8 (eight) hours as needed for nausea or vomiting.    . prochlorperazine (COMPAZINE)  10 MG tablet Take 10 mg by mouth every 6 (six) hours as needed for nausea or vomiting.    [name] VITAMIN E PO Take 1 tablet by mouth See admin instructions. 2-3 times a month     No current facility-administered medications for this visit.    PHYSICAL EXAMINATION: ECOG PERFORMANCE STATUS: 2 - Symptomatic, <50% confined to bed  Vitals:   08/02/19 0905  BP: (!) 158/61  Pulse: 68  Resp: 18  Temp: 98.5 F (36.9 C)  SpO2: 100%   Filed Weights   08/02/19 0905  Weight: 177 lb 4.8 oz (80.4 kg)    Due to COVID19 we will limit examination to appearance. Patient had no complaints.  GENERAL:alert, no distress and comfortable SKIN: skin color normal, no rashes or significant lesions (+) Skin darkening and peeling of hands  EYES: normal, Conjunctiva are pink and non-injected, sclera clear  NEURO: alert & oriented x 3 with fluent speech   LABORATORY DATA:  I have reviewed the data as listed CBC Latest Ref Rng & Units 08/02/2019 07/19/2019 07/06/2019  WBC 4.0 - 10.5 K/uL 8.3 4.9 4.9  Hemoglobin 12.0 - 15.0 g/dL 11.1(L) 11.0(L) 10.9(L)  Hematocrit 36.0 - 46.0 % 34.6(L) 34.8(L) 34.6(L)  Platelets 150 - 400 K/uL 122(L) 114(L) 103(L)     CMP Latest Ref Rng & Units 08/02/2019 07/19/2019 07/06/2019  Glucose 70 - 99 mg/dL 175(H) 163(H) 131(H)  BUN 8 - 23 mg/dL _0 Creatinine 0.44 - 1.00 mg/dL 0.81 0.82 0.80  Sodium 135 - 145 mmol/L 138 142 141  Potassium 3.5 - 5.1 mmol/L 4.2 3.7 3.9  Chloride 98 - 111 mmol/L 104 105 107  CO2 22 - 32 mmol/L _1 Calcium 8.9 - 10.3 mg/dL 9.3 9.3 8.9  Total Protein 6.5 - 8.1 g/dL 7.2 7.0 6.9  Total Bilirubin 0.3 - 1.2 mg/dL 0.4 0.3 0.4  Alkaline Phos 38 - 126 U/L 104 102 102  AST 15 - 41 U/L 33 20 24  ALT 0 - 44 U/L _2 RADIOGRAPHIC STUDIES: I have personally reviewed the radiological images as listed and agreed with the findings in the report. No results found.   ASSESSMENT & PLAN:  NALY SCHWANZ is a  74 y.o. 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 She was treated with adjuvant concurrent ChemoRT with Xeloda for about 6 weeks.  -Based on 08/2017 CT scan and Liver biopsy at [facility] she had cancer recurrence with metastasis. She was then treated with Radiation for about 3 weeks  -Her 05/14/19 CT from [facility] showsinfiltrative mass within the porta hepatis encasing the portal triads measuring up to 4.1 cm, concerning for residual/recurrent cholangiocarcinoma.She is stage IV and her cancer is no longer curable at this point but still treatable.  -Given poor tolerance to more intensive treatment in the past, I started her on first-line5-fluorouracil and leucovorin IV every 2 weekson 06/21/19. Goal of therapy is to control her disease and prolong her life.  -She reports she is tolerating chemo poorly, very concerned about her fatigye, low appetite, hair loss and her skin hyperpigmentation in her hands, she has lost about 16 lbs in the past 2 weeks. She is quite overwhelmed and would like to reduce her chemo.  -I discussed taking a chemo break to give her more time to recover. I discussed scanning her earlier to see if she is responding to this treatment then consider continuing at lower dose or moving changing treatment. She is agreeable.  -I discussed palliative care and hospice if she chooses to not proceed with anymore treatment. We reviewed the nature course of metastatic cholangiocarcinoma, and symptoms related to cancer progression.   -She plans to have colonoscopy on 08/12/19. -Phone visit the day after CT scan next week.    2. Lower appetite, weight loss  -She notes she has not been eating well on chemo and has mild weight loss.  -She is on Mirtazapine, will continue  -I recommend she increase boost to 2-3 times a day.     3. DM, HTN -Will watch on chemo.We discussed that chemo could affect her BP and BG.I recommend she watch her BP at home.  -She is apprehensive about using BG monitor given concerns of finger numbness.  -Her blood pressure and random sugar has been stable lately   4. Mild anemia and thrombocytopenia -Secondary to chemotherapy, overall stable  5.Goal of care discussion, DNR/DNI -We again discussed the incurable nature of her cancer, and the overall poor prognosis, especially if she does not have good response to chemotherapy or progress on chemo -The patient understands the goal of care is palliative. -I recommend DNR/DNI, she agrees  -She notes she has 3 remaining children who live in [place] who offered to move her in if she needs more help. She does not want to move in with anyone of her family members and rather have help from social services or home care service. I dicussed this may be out of pocket cost.  -She notes she does not have living will set up, I encouraged her to set up will and POA (her son) to direct her medical care.   PLAN:  -will hold her chemo today due to her poor tolerance and significant weight loss  -CT CAP w contrast next week  -Phone call the day after CT scan to determine next step  -dietician consult    No problem-specific Assessment & Plan notes found for this encounter.   Orders Placed This Encounter  Procedures  . CT Abdomen Pelvis W Contrast    Standing Status:  Future    Standing Expiration Date:   08/01/2020    Order Specific Question:   If indicated for the ordered procedure, I authorize the administration of contrast media per Radiology protocol    Answer:   Yes    Order Specific Question:   Preferred imaging location?    Answer:   [facility]    Order Specific Question:   Is Oral Contrast requested for this exam?    Answer:   Yes, Per Radiology protocol    Order Specific Question:   Radiology Contrast Protocol - do  NOT remove file path    Answer:   _0 [email or web address]   All questions were answered. The patient knows to call the clinic with any problems, questions or concerns. No barriers to learning was detected. The total time spent in the appointment was 30 minutes.     Truitt Merle, MD 08/02/2019   I, Joslyn Devon, am acting as scribe for Truitt Merle, MD.   I have reviewed the above documentation for accuracy and completeness, and I agree with the above.

## 2019-08-02 ENCOUNTER — Other Ambulatory Visit: Payer: Self-pay

## 2019-08-02 ENCOUNTER — Inpatient Hospital Stay: Payer: Medicare HMO

## 2019-08-02 ENCOUNTER — Inpatient Hospital Stay (HOSPITAL_BASED_OUTPATIENT_CLINIC_OR_DEPARTMENT_OTHER): Payer: Medicare HMO | Admitting: Hematology

## 2019-08-02 ENCOUNTER — Encounter: Payer: Self-pay | Admitting: Hematology

## 2019-08-02 VITALS — BP 158/61 | HR 68 | Temp 98.5°F | Resp 18 | Ht 62.0 in | Wt 177.3 lb

## 2019-08-02 DIAGNOSIS — D6959 Other secondary thrombocytopenia: Secondary | ICD-10-CM | POA: Diagnosis not present

## 2019-08-02 DIAGNOSIS — C221 Intrahepatic bile duct carcinoma: Secondary | ICD-10-CM

## 2019-08-02 DIAGNOSIS — T451X5A Adverse effect of antineoplastic and immunosuppressive drugs, initial encounter: Secondary | ICD-10-CM | POA: Diagnosis not present

## 2019-08-02 DIAGNOSIS — C787 Secondary malignant neoplasm of liver and intrahepatic bile duct: Secondary | ICD-10-CM | POA: Diagnosis not present

## 2019-08-02 DIAGNOSIS — D6481 Anemia due to antineoplastic chemotherapy: Secondary | ICD-10-CM | POA: Diagnosis not present

## 2019-08-02 DIAGNOSIS — Z95828 Presence of other vascular implants and grafts: Secondary | ICD-10-CM

## 2019-08-02 DIAGNOSIS — E119 Type 2 diabetes mellitus without complications: Secondary | ICD-10-CM | POA: Diagnosis not present

## 2019-08-02 DIAGNOSIS — S0083XA Contusion of other part of head, initial encounter: Secondary | ICD-10-CM | POA: Diagnosis not present

## 2019-08-02 DIAGNOSIS — I10 Essential (primary) hypertension: Secondary | ICD-10-CM | POA: Diagnosis not present

## 2019-08-02 DIAGNOSIS — Z5111 Encounter for antineoplastic chemotherapy: Secondary | ICD-10-CM | POA: Diagnosis not present

## 2019-08-02 LAB — CMP (CANCER CENTER ONLY)
ALT: 21 U/L (ref 0–44)
AST: 33 U/L (ref 15–41)
Albumin: 3 g/dL — ABNORMAL LOW (ref 3.5–5.0)
Alkaline Phosphatase: 104 U/L (ref 38–126)
Anion gap: 8 (ref 5–15)
BUN: 13 mg/dL (ref 8–23)
CO2: 26 mmol/L (ref 22–32)
Calcium: 9.3 mg/dL (ref 8.9–10.3)
Chloride: 104 mmol/L (ref 98–111)
Creatinine: 0.81 mg/dL (ref 0.44–1.00)
GFR, Est AFR Am: >60 mL/min (ref >60)
GFR, Estimated: >60 mL/min (ref >60)
Glucose, Bld: 175 mg/dL — ABNORMAL HIGH (ref 70–99)
Potassium: 4.2 mmol/L (ref 3.5–5.1)
Sodium: 138 mmol/L (ref 135–145)
Total Bilirubin: 0.4 mg/dL (ref 0.3–1.2)
Total Protein: 7.2 g/dL (ref 6.5–8.1)

## 2019-08-02 LAB — CBC WITH DIFFERENTIAL (CANCER CENTER ONLY)
Abs Immature Granulocytes: 0.03 10*3/uL (ref 0.00–0.07)
Basophils Absolute: 0 10*3/uL (ref 0.0–0.1)
Basophils Relative: 1 %
Eosinophils Absolute: 0.1 10*3/uL (ref 0.0–0.5)
Eosinophils Relative: 1 %
HCT: 34.6 % — ABNORMAL LOW (ref 36.0–46.0)
Hemoglobin: 11.1 g/dL — ABNORMAL LOW (ref 12.0–15.0)
Immature Granulocytes: 0 %
Lymphocytes Relative: 20 %
Lymphs Abs: 1.6 10*3/uL (ref 0.7–4.0)
MCH: 28.8 pg (ref 26.0–34.0)
MCHC: 32.1 g/dL (ref 30.0–36.0)
MCV: 89.6 fL (ref 80.0–100.0)
Monocytes Absolute: 1 10*3/uL (ref 0.1–1.0)
Monocytes Relative: 12 %
Neutro Abs: 5.6 10*3/uL (ref 1.7–7.7)
Neutrophils Relative %: 66 %
Platelet Count: 122 10*3/uL — ABNORMAL LOW (ref 150–400)
RBC: 3.86 MIL/uL — ABNORMAL LOW (ref 3.87–5.11)
RDW: 17.7 % — ABNORMAL HIGH (ref 11.5–15.5)
WBC Count: 8.3 10*3/uL (ref 4.0–10.5)
nRBC: 0 % (ref 0.0–0.2)

## 2019-08-02 MED ORDER — SODIUM CHLORIDE 0.9% FLUSH
10.0000 mL | INTRAVENOUS | Status: DC | PRN
  Administered 2019-08-02: 10 mL
  Filled 2019-08-02: qty 10

## 2019-08-03 ENCOUNTER — Telehealth: Payer: Self-pay | Admitting: Hematology

## 2019-08-03 ENCOUNTER — Telehealth: Payer: Self-pay

## 2019-08-03 NOTE — Telephone Encounter (Signed)
Ms [place] daughter, Reuben Likes, left vm today wanting to know what to expect if her mother doesn't get any further treatment.

## 2019-08-03 NOTE — Telephone Encounter (Signed)
Scheduled appt per 4/26 los.  Spoke with pt and she is aware of the appt date and time.

## 2019-08-04 NOTE — Telephone Encounter (Signed)
With pt's permission, I called her daughter Kennyth Lose back and spoke with her, and answered her questions. I suggested her to come in with her mother on her next office visit after her CT scan which is scheduled on 5/11, she appreciated the call.   Truitt Merle MD

## 2019-08-09 ENCOUNTER — Other Ambulatory Visit (HOSPITAL_COMMUNITY)
Admission: RE | Admit: 2019-08-09 | Discharge: 2019-08-09 | Disposition: A | Discharge status: Home / Self Care | Payer: Medicare HMO | Source: Ambulatory Visit | Attending: Gastroenterology | Admitting: Gastroenterology

## 2019-08-09 ENCOUNTER — Other Ambulatory Visit (INDEPENDENT_AMBULATORY_CARE_PROVIDER_SITE_OTHER): Payer: Medicare HMO

## 2019-08-09 DIAGNOSIS — K625 Hemorrhage of anus and rectum: Secondary | ICD-10-CM

## 2019-08-09 DIAGNOSIS — Z20822 Contact with and (suspected) exposure to covid-19: Secondary | ICD-10-CM | POA: Diagnosis not present

## 2019-08-09 DIAGNOSIS — Z01812 Encounter for preprocedural laboratory examination: Secondary | ICD-10-CM | POA: Insufficient documentation

## 2019-08-09 LAB — CBC
HCT: 33.8 % — ABNORMAL LOW (ref 36.0–46.0)
Hemoglobin: 10.9 g/dL — ABNORMAL LOW (ref 12.0–15.0)
MCHC: 32.3 g/dL (ref 30.0–36.0)
MCV: 90.7 fl (ref 78.0–100.0)
Platelets: 152 10*3/uL (ref 150.0–400.0)
RBC: 3.73 Mil/uL — ABNORMAL LOW (ref 3.87–5.11)
RDW: 19.1 % — ABNORMAL HIGH (ref 11.5–15.5)
WBC: 8.1 10*3/uL (ref 4.0–10.5)

## 2019-08-09 MED FILL — SUPREP BOWEL PREP KIT: 17.5-3.13-1 | 1 days supply | Qty: 354 | Fill #0

## 2019-08-10 LAB — SARS CORONAVIRUS 2 (TAT 6-24 HRS): SARS Coronavirus 2: NEGATIVE

## 2019-08-10 NOTE — Progress Notes (Signed)
Pharmacist Chemotherapy Monitoring - Follow Up Assessment    I verify that I have reviewed each item in the below checklist:  . Regimen for the patient is scheduled for the appropriate day and plan matches scheduled date. [name] Appropriate non-routine labs are ordered dependent on drug ordered. . If applicable, additional medications reviewed and ordered per protocol based on lifetime cumulative doses and/or treatment regimen.   Plan for follow-up and/or issues identified: No . I-vent associated with next due treatment: No . MD and/or nursing notified: No  Bonnie Peterson K 08/10/2019 11:35 AM

## 2019-08-12 ENCOUNTER — Ambulatory Visit (HOSPITAL_COMMUNITY): Payer: Medicare HMO | Admitting: Certified Registered Nurse Anesthetist

## 2019-08-12 ENCOUNTER — Encounter (HOSPITAL_COMMUNITY)
Admission: RE | Disposition: A | Discharge status: Home / Self Care | Payer: Self-pay | Source: Home / Self Care | Attending: Gastroenterology

## 2019-08-12 ENCOUNTER — Other Ambulatory Visit: Payer: Self-pay

## 2019-08-12 ENCOUNTER — Ambulatory Visit (HOSPITAL_COMMUNITY)
Admission: RE | Admit: 2019-08-12 | Discharge: 2019-08-12 | Disposition: A | Discharge status: Home / Self Care | Payer: Medicare HMO | Attending: Gastroenterology | Admitting: Gastroenterology

## 2019-08-12 ENCOUNTER — Encounter (HOSPITAL_COMMUNITY): Payer: Self-pay | Admitting: Gastroenterology

## 2019-08-12 DIAGNOSIS — M1990 Unspecified osteoarthritis, unspecified site: Secondary | ICD-10-CM | POA: Insufficient documentation

## 2019-08-12 DIAGNOSIS — C772 Secondary and unspecified malignant neoplasm of intra-abdominal lymph nodes: Secondary | ICD-10-CM | POA: Insufficient documentation

## 2019-08-12 DIAGNOSIS — K5730 Diverticulosis of large intestine without perforation or abscess without bleeding: Secondary | ICD-10-CM | POA: Diagnosis not present

## 2019-08-12 DIAGNOSIS — G4733 Obstructive sleep apnea (adult) (pediatric): Secondary | ICD-10-CM | POA: Insufficient documentation

## 2019-08-12 DIAGNOSIS — F329 Major depressive disorder, single episode, unspecified: Secondary | ICD-10-CM | POA: Insufficient documentation

## 2019-08-12 DIAGNOSIS — E785 Hyperlipidemia, unspecified: Secondary | ICD-10-CM | POA: Diagnosis not present

## 2019-08-12 DIAGNOSIS — Z90710 Acquired absence of both cervix and uterus: Secondary | ICD-10-CM | POA: Insufficient documentation

## 2019-08-12 DIAGNOSIS — I10 Essential (primary) hypertension: Secondary | ICD-10-CM | POA: Insufficient documentation

## 2019-08-12 DIAGNOSIS — H8110 Benign paroxysmal vertigo, unspecified ear: Secondary | ICD-10-CM | POA: Diagnosis not present

## 2019-08-12 DIAGNOSIS — D175 Benign lipomatous neoplasm of intra-abdominal organs: Secondary | ICD-10-CM | POA: Diagnosis not present

## 2019-08-12 DIAGNOSIS — Z87891 Personal history of nicotine dependence: Secondary | ICD-10-CM | POA: Insufficient documentation

## 2019-08-12 DIAGNOSIS — C221 Intrahepatic bile duct carcinoma: Secondary | ICD-10-CM | POA: Insufficient documentation

## 2019-08-12 DIAGNOSIS — F419 Anxiety disorder, unspecified: Secondary | ICD-10-CM | POA: Diagnosis not present

## 2019-08-12 DIAGNOSIS — Z6832 Body mass index (BMI) 32.0-32.9, adult: Secondary | ICD-10-CM | POA: Diagnosis not present

## 2019-08-12 DIAGNOSIS — Z800 Family history of malignant neoplasm of digestive organs: Secondary | ICD-10-CM | POA: Insufficient documentation

## 2019-08-12 DIAGNOSIS — Z79899 Other long term (current) drug therapy: Secondary | ICD-10-CM | POA: Diagnosis not present

## 2019-08-12 DIAGNOSIS — J449 Chronic obstructive pulmonary disease, unspecified: Secondary | ICD-10-CM | POA: Insufficient documentation

## 2019-08-12 DIAGNOSIS — Z8261 Family history of arthritis: Secondary | ICD-10-CM | POA: Insufficient documentation

## 2019-08-12 DIAGNOSIS — K219 Gastro-esophageal reflux disease without esophagitis: Secondary | ICD-10-CM | POA: Insufficient documentation

## 2019-08-12 DIAGNOSIS — Z791 Long term (current) use of non-steroidal anti-inflammatories (NSAID): Secondary | ICD-10-CM | POA: Insufficient documentation

## 2019-08-12 DIAGNOSIS — Z833 Family history of diabetes mellitus: Secondary | ICD-10-CM | POA: Insufficient documentation

## 2019-08-12 DIAGNOSIS — I272 Pulmonary hypertension, unspecified: Secondary | ICD-10-CM | POA: Diagnosis not present

## 2019-08-12 DIAGNOSIS — K589 Irritable bowel syndrome without diarrhea: Secondary | ICD-10-CM | POA: Diagnosis not present

## 2019-08-12 DIAGNOSIS — Z86010 Personal history of colonic polyps: Secondary | ICD-10-CM | POA: Insufficient documentation

## 2019-08-12 DIAGNOSIS — K625 Hemorrhage of anus and rectum: Secondary | ICD-10-CM

## 2019-08-12 DIAGNOSIS — Z923 Personal history of irradiation: Secondary | ICD-10-CM | POA: Insufficient documentation

## 2019-08-12 DIAGNOSIS — Z7984 Long term (current) use of oral hypoglycemic drugs: Secondary | ICD-10-CM | POA: Diagnosis not present

## 2019-08-12 DIAGNOSIS — Z803 Family history of malignant neoplasm of breast: Secondary | ICD-10-CM | POA: Insufficient documentation

## 2019-08-12 DIAGNOSIS — Z9889 Other specified postprocedural states: Secondary | ICD-10-CM | POA: Diagnosis not present

## 2019-08-12 DIAGNOSIS — Z8249 Family history of ischemic heart disease and other diseases of the circulatory system: Secondary | ICD-10-CM | POA: Insufficient documentation

## 2019-08-12 DIAGNOSIS — K648 Other hemorrhoids: Secondary | ICD-10-CM | POA: Diagnosis not present

## 2019-08-12 HISTORY — PX: COLONOSCOPY WITH PROPOFOL: SHX5780

## 2019-08-12 LAB — GLUCOSE, CAPILLARY: Glucose-Capillary: 144 mg/dL — ABNORMAL HIGH (ref 70–99)

## 2019-08-12 SURGERY — COLONOSCOPY WITH PROPOFOL
Anesthesia: Monitor Anesthesia Care

## 2019-08-12 MED ORDER — PROPOFOL 1000 MG/100ML IV EMUL
INTRAVENOUS | Status: AC
  Filled 2019-08-12: qty 200

## 2019-08-12 MED ORDER — PROPOFOL 500 MG/50ML IV EMUL
INTRAVENOUS | Status: DC | PRN
  Administered 2019-08-12: 70 mg via INTRAVENOUS
  Administered 2019-08-12: 50 ug/kg/min via INTRAVENOUS

## 2019-08-12 MED ORDER — LIDOCAINE 2% (20 MG/ML) 5 ML SYRINGE
INTRAMUSCULAR | Status: DC | PRN
  Administered 2019-08-12: 100 mg via INTRAVENOUS

## 2019-08-12 MED ORDER — EPHEDRINE SULFATE-NACL 50-0.9 MG/10ML-% IV SOSY
PREFILLED_SYRINGE | INTRAVENOUS | Status: DC | PRN
  Administered 2019-08-12: 10 mg via INTRAVENOUS

## 2019-08-12 MED ORDER — SODIUM CHLORIDE 0.9 % IV SOLN: INTRAVENOUS | Status: DC

## 2019-08-12 MED ORDER — LACTATED RINGERS IV SOLN
INTRAVENOUS | Status: DC
  Administered 2019-08-12: 1000 mL via INTRAVENOUS

## 2019-08-12 SURGICAL SUPPLY — 22 items

## 2019-08-12 NOTE — Transfer of Care (Signed)
Immediate Anesthesia Transfer of Care Note  [name]  Procedure(s) Performed: Procedure(s): COLONOSCOPY WITH PROPOFOL (N/A)  Patient Location: PACU and Endoscopy Unit  Anesthesia Type:MAC  Level of Consciousness: awake, alert  and oriented  Airway & Oxygen Therapy: Patient Spontanous Breathing and Patient connected to nasal cannula oxygen  Post-op Assessment: Report given to RN and Post -op Vital signs reviewed and stable  Post vital signs: Reviewed and stable  Last Vitals:  Vitals:   08/12/19 0913  BP: (!) 173/55  Pulse: 69  Resp: 19  Temp: 36.7 C  SpO2: 956%    Complications: No apparent anesthesia complications

## 2019-08-12 NOTE — Anesthesia Preprocedure Evaluation (Signed)
Anesthesia Evaluation  Patient identified by MRN, date of birth, ID band Patient awake    Reviewed: Allergy & Precautions, NPO status , Patient's Chart, lab work & pertinent test results  History of Anesthesia Complications Negative for: history of anesthetic complications  Airway Mallampati: II  TM Distance: >3 FB Neck ROM: Full    Dental   Pulmonary sleep apnea , COPD, former smoker,    Pulmonary exam normal        Cardiovascular hypertension, Normal cardiovascular exam     Neuro/Psych negative neurological ROS  negative psych ROS   GI/Hepatic Neg liver ROS, GERD  ,  Endo/Other  diabetes  Renal/GU negative Renal ROS  negative genitourinary   Musculoskeletal  (+) Arthritis ,   Abdominal   Peds  Hematology  (+) anemia ,   Anesthesia Other Findings   Reproductive/Obstetrics                            Anesthesia Physical Anesthesia Plan  ASA: III  Anesthesia Plan: MAC   Post-op Pain Management:    Induction: Intravenous  PONV Risk Score and Plan: 2 and Propofol infusion, TIVA and Treatment may vary due to age or medical condition  Airway Management Planned: Natural Airway, Nasal Cannula and Simple Face Mask  Additional Equipment: None  Intra-op Plan:   Post-operative Plan:   Informed Consent: I have reviewed the patients History and Physical, chart, labs and discussed the procedure including the risks, benefits and alternatives for the proposed anesthesia with the patient or authorized representative who has indicated his/her understanding and acceptance.       Plan Discussed with:   Anesthesia Plan Comments:         Anesthesia Quick Evaluation

## 2019-08-12 NOTE — Op Note (Signed)
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 Providers:                [name], MD, Clyde Lundborg, RN, Elmer Ramp.                            Tilden Dome, RN, Laverda Sorenson, Technician, Eliberto Ivory, CRNA Referring MD:              Medicines:                Monitored Anesthesia Care Complications:            No immediate complications. Estimated blood loss:                            None. Estimated Blood Loss:     Estimated blood loss: none. Procedure:                Pre-Anesthesia Assessment:                           - Prior to the procedure, a History and Physical                            was performed, and patient medications and                            allergies were reviewed. The patient's tolerance of                            previous anesthesia was also reviewed. The risks                            and benefits of the  procedure and the sedation                            options and risks were discussed with the patient.                            All questions were answered, and informed consent                            was obtained. Prior Anticoagulants: The patient has                            taken no previous anticoagulant or antiplatelet                            agents. ASA Grade Assessment: IV - A patient with                             severe systemic disease that is a constant threat                            to life. After reviewing the risks and benefits,                            the patient was deemed in satisfactory condition to                            undergo the procedure.                           After obtaining informed consent, the colonoscope                            was passed under direct vision. Throughout the                            procedure, the patient's blood pressure, pulse, and                            oxygen saturations were monitored continuously. The                            CF-HQ190L ([id number] Olympus colonoscope was                            introduced through the anus and advanced to the the                            cecum, identified by appendiceal orifice and                            ileocecal valve. The colonoscopy was performed  without difficulty. The patient tolerated the                            procedure well. The quality of the bowel                            preparation was good. The ileocecal valve,                            appendiceal orifice, and rectum were photographed. Scope In: 9:38:37 AM Scope Out: 9:50:27 AM Scope Withdrawal Time: 0 hours 8 minutes 11 seconds  Total Procedure Duration: 0 hours 11 minutes 50 seconds  Findings:      There were several lipomas spread throughout the colon. The largest was       in the sigmoid (pedunculated 2-3cm, slightly inflamed at the tip). This       was unchanged from 2015, the previous submucosal tattoo was still       evident.      Internal hemorrhoids were found. The hemorrhoids were small.      Multiple small and large-mouthed diverticula were found in the left       colon.      The exam was otherwise without abnormality on direct and retroflexion       views. Impression:               - There were several lipomas spread throughout the                             colon. The largest was in the sigmoid (pedunculated                            2-3cm, slightly inflamed at the tip). This was                            unchanged from 2015, the previous submucosal tattoo                            was still evident.                           - Internal hemorrhoids (this is likely the cause of                            your minor rectal bleeding).                           - Diverticulosis in the left colon.                           - The examination was otherwise normal on direct                            and retroflexion views.                           - No polyps or cancers. Moderate  Sedation:      Not Applicable - Patient had care per Anesthesia. Recommendation:           - Patient has a contact number available for                            emergencies. The signs and symptoms of potential                            delayed complications were discussed with the                            patient. Return to normal activities tomorrow.                            Written discharge instructions were provided to the                            patient.                           - Resume previous diet.                           - Continue present medications. 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 All rights reserved. The codes documented in this report are preliminary and upon coder review may  be revised to meet current compliance requirements. [name], MD 08/12/2019 10:00:02 AM This report  has been signed electronically. Number of Addenda: 0

## 2019-08-12 NOTE — Anesthesia Postprocedure Evaluation (Signed)
Anesthesia Post Note  Patient: SALIAH CRISP  Procedure(s) Performed: COLONOSCOPY WITH PROPOFOL (N/A )     Patient location during evaluation: Endoscopy Anesthesia Type: MAC Level of consciousness: awake and alert Pain management: pain level controlled Vital Signs Assessment: post-procedure vital signs reviewed and stable Respiratory status: spontaneous breathing, nonlabored ventilation and respiratory function stable Cardiovascular status: blood pressure returned to baseline and stable Postop Assessment: no apparent nausea or vomiting Anesthetic complications: no    Last Vitals:  Vitals:   08/12/19 1010 08/12/19 1020  BP: (!) 131/49 (!) 148/55  Pulse: 76 72  Resp: (!) 23 16  Temp:    SpO2: 98% 97%    Last Pain:  Vitals:   08/12/19 1020  TempSrc:   PainSc: 0-No pain                 Lidia Collum

## 2019-08-12 NOTE — Interval H&P Note (Signed)
History and Physical Interval Note:  08/12/2019 9:03 AM  Bonnie Peterson  has presented today for surgery, with the diagnosis of rectal bleeding.  The various methods of treatment have been discussed with the patient and family. After consideration of risks, benefits and other options for treatment, the patient has consented to  Procedure(s): COLONOSCOPY WITH PROPOFOL (N/A) as a surgical intervention.  The patient's history has been reviewed, patient examined, no change in status, stable for surgery.  I have reviewed the patient's chart and labs.  Questions were answered to the patient's satisfaction.     [name]

## 2019-08-13 ENCOUNTER — Inpatient Hospital Stay: Payer: Medicare HMO | Admitting: Hematology

## 2019-08-13 ENCOUNTER — Telehealth: Payer: Self-pay | Admitting: Hematology

## 2019-08-13 ENCOUNTER — Encounter: Payer: Self-pay | Admitting: *Deleted

## 2019-08-13 NOTE — Telephone Encounter (Signed)
Called pt to confirm appts 5/13 - pt aware.

## 2019-08-16 ENCOUNTER — Other Ambulatory Visit: Payer: Medicare HMO

## 2019-08-16 ENCOUNTER — Encounter: Payer: Medicare HMO | Admitting: Nutrition

## 2019-08-16 ENCOUNTER — Ambulatory Visit: Payer: Medicare HMO | Admitting: Nurse Practitioner

## 2019-08-16 ENCOUNTER — Inpatient Hospital Stay: Payer: Medicare HMO

## 2019-08-17 ENCOUNTER — Encounter (HOSPITAL_COMMUNITY): Payer: Self-pay

## 2019-08-17 ENCOUNTER — Other Ambulatory Visit: Payer: Self-pay

## 2019-08-17 ENCOUNTER — Ambulatory Visit (HOSPITAL_COMMUNITY)
Admission: RE | Admit: 2019-08-17 | Discharge: 2019-08-17 | Disposition: A | Discharge status: Home / Self Care | Payer: Medicare HMO | Source: Ambulatory Visit | Attending: Hematology | Admitting: Hematology

## 2019-08-17 ENCOUNTER — Inpatient Hospital Stay: Payer: Medicare HMO | Attending: Physician Assistant

## 2019-08-17 ENCOUNTER — Inpatient Hospital Stay: Payer: Medicare HMO

## 2019-08-17 DIAGNOSIS — F419 Anxiety disorder, unspecified: Secondary | ICD-10-CM | POA: Diagnosis not present

## 2019-08-17 DIAGNOSIS — Z79899 Other long term (current) drug therapy: Secondary | ICD-10-CM | POA: Insufficient documentation

## 2019-08-17 DIAGNOSIS — Z7984 Long term (current) use of oral hypoglycemic drugs: Secondary | ICD-10-CM | POA: Diagnosis not present

## 2019-08-17 DIAGNOSIS — Z791 Long term (current) use of non-steroidal anti-inflammatories (NSAID): Secondary | ICD-10-CM | POA: Insufficient documentation

## 2019-08-17 DIAGNOSIS — E119 Type 2 diabetes mellitus without complications: Secondary | ICD-10-CM | POA: Diagnosis not present

## 2019-08-17 DIAGNOSIS — C221 Intrahepatic bile duct carcinoma: Secondary | ICD-10-CM | POA: Diagnosis not present

## 2019-08-17 DIAGNOSIS — Z7901 Long term (current) use of anticoagulants: Secondary | ICD-10-CM | POA: Insufficient documentation

## 2019-08-17 DIAGNOSIS — E785 Hyperlipidemia, unspecified: Secondary | ICD-10-CM | POA: Diagnosis not present

## 2019-08-17 DIAGNOSIS — I10 Essential (primary) hypertension: Secondary | ICD-10-CM | POA: Insufficient documentation

## 2019-08-17 DIAGNOSIS — F329 Major depressive disorder, single episode, unspecified: Secondary | ICD-10-CM | POA: Diagnosis not present

## 2019-08-17 DIAGNOSIS — Z923 Personal history of irradiation: Secondary | ICD-10-CM | POA: Insufficient documentation

## 2019-08-17 DIAGNOSIS — J449 Chronic obstructive pulmonary disease, unspecified: Secondary | ICD-10-CM | POA: Insufficient documentation

## 2019-08-17 DIAGNOSIS — Z452 Encounter for adjustment and management of vascular access device: Secondary | ICD-10-CM | POA: Insufficient documentation

## 2019-08-17 DIAGNOSIS — Z9221 Personal history of antineoplastic chemotherapy: Secondary | ICD-10-CM | POA: Insufficient documentation

## 2019-08-17 DIAGNOSIS — G4733 Obstructive sleep apnea (adult) (pediatric): Secondary | ICD-10-CM | POA: Insufficient documentation

## 2019-08-17 DIAGNOSIS — D649 Anemia, unspecified: Secondary | ICD-10-CM | POA: Insufficient documentation

## 2019-08-17 LAB — CBC WITH DIFFERENTIAL (CANCER CENTER ONLY)
Abs Immature Granulocytes: 0.02 10*3/uL (ref 0.00–0.07)
Basophils Absolute: 0.1 10*3/uL (ref 0.0–0.1)
Basophils Relative: 1 %
Eosinophils Absolute: 0.1 10*3/uL (ref 0.0–0.5)
Eosinophils Relative: 2 %
HCT: 37.7 % (ref 36.0–46.0)
Hemoglobin: 11.6 g/dL — ABNORMAL LOW (ref 12.0–15.0)
Immature Granulocytes: 0 %
Lymphocytes Relative: 23 %
Lymphs Abs: 2 10*3/uL (ref 0.7–4.0)
MCH: 28.7 pg (ref 26.0–34.0)
MCHC: 30.8 g/dL (ref 30.0–36.0)
MCV: 93.3 fL (ref 80.0–100.0)
Monocytes Absolute: 0.8 10*3/uL (ref 0.1–1.0)
Monocytes Relative: 10 %
Neutro Abs: 5.5 10*3/uL (ref 1.7–7.7)
Neutrophils Relative %: 64 %
Platelet Count: 153 10*3/uL (ref 150–400)
RBC: 4.04 MIL/uL (ref 3.87–5.11)
RDW: 18.2 % — ABNORMAL HIGH (ref 11.5–15.5)
WBC Count: 8.5 10*3/uL (ref 4.0–10.5)
nRBC: 0 % (ref 0.0–0.2)

## 2019-08-17 LAB — CMP (CANCER CENTER ONLY)
ALT: 24 U/L (ref 0–44)
AST: 33 U/L (ref 15–41)
Albumin: 3 g/dL — ABNORMAL LOW (ref 3.5–5.0)
Alkaline Phosphatase: 122 U/L (ref 38–126)
Anion gap: 9 (ref 5–15)
BUN: 11 mg/dL (ref 8–23)
CO2: 24 mmol/L (ref 22–32)
Calcium: 8.9 mg/dL (ref 8.9–10.3)
Chloride: 104 mmol/L (ref 98–111)
Creatinine: 0.79 mg/dL (ref 0.44–1.00)
GFR, Est AFR Am: >60 mL/min (ref >60)
GFR, Estimated: >60 mL/min (ref >60)
Glucose, Bld: 135 mg/dL — ABNORMAL HIGH (ref 70–99)
Potassium: 4.6 mmol/L (ref 3.5–5.1)
Sodium: 137 mmol/L (ref 135–145)
Total Bilirubin: 0.4 mg/dL (ref 0.3–1.2)
Total Protein: 7.5 g/dL (ref 6.5–8.1)

## 2019-08-17 MED ORDER — SODIUM CHLORIDE (PF) 0.9 % IJ SOLN
INTRAMUSCULAR | Status: AC
  Filled 2019-08-17: qty 50

## 2019-08-17 MED ORDER — IOHEXOL 300 MG/ML  SOLN
100.0000 mL | Freq: Once | INTRAMUSCULAR | Status: AC | PRN
  Administered 2019-08-17: 100 mL via INTRAVENOUS

## 2019-08-19 ENCOUNTER — Encounter: Payer: Self-pay | Admitting: Nutrition

## 2019-08-19 ENCOUNTER — Inpatient Hospital Stay: Payer: Medicare HMO | Admitting: Hematology

## 2019-08-19 ENCOUNTER — Telehealth: Payer: Self-pay

## 2019-08-19 ENCOUNTER — Inpatient Hospital Stay: Payer: Medicare HMO | Admitting: Nutrition

## 2019-08-19 NOTE — Progress Notes (Signed)
Patient canceled nutrition appointment today.  She did not reschedule.

## 2019-08-19 NOTE — Telephone Encounter (Signed)
I called pt to check on her. She has been having some nausea, fatigue and low appetite since last night, no fever or other new symptoms, still able to eat and drink some, no dizziness. She cancelled her appointment today, but does not feel need to come in for IVF. She would like to reschedule her appointment to next week, will send a schedule message.  Truitt Merle  08/19/2019

## 2019-08-19 NOTE — Telephone Encounter (Signed)
Received message from Ms Evers via [employer] she is cancelling her appt for today.

## 2019-08-20 ENCOUNTER — Telehealth: Payer: Self-pay | Admitting: Hematology

## 2019-08-20 NOTE — Telephone Encounter (Signed)
Scheduled per 5/13. Pt aware of appts on 5/20.

## 2019-08-24 ENCOUNTER — Telehealth: Payer: Self-pay | Admitting: *Deleted

## 2019-08-24 NOTE — Telephone Encounter (Signed)
Received fax from [facility] stating that this pt is a good candidate for statin therapy.  Recommended medication is atorvastatin, rosuvastatin, pravastatin, lovastatin. Ellianah Cordy Zimmerman Rumple, CMA

## 2019-08-25 DIAGNOSIS — C221 Intrahepatic bile duct carcinoma: Secondary | ICD-10-CM | POA: Diagnosis not present

## 2019-08-25 NOTE — Progress Notes (Signed)
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 Mettu 1. 2014 Mass in the left side of the liver incidentally found during work up for acute pancreatitis.  The mass was thought to be a bile duct lesion and was followed up.  2. 02/2013 MRI hyperdense focus within the left hepatic lobe.  3. 02/21/13 CT scan 3.7 x 2.1 x 2.4 cm arterial enhancing lesion.  AFP and CEA were normal.  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 4. 05/18/12 Biopsy of something (surg path in Care Everywhere, but no report) 5. 07/02/13 Biopsy of something (surg path in Care Everywhere, but no report) 6. 09/02/13 MRI lesion in segment 4A with adjacent perfusional enhancement with some washout. Lesion is close to left portal vein. 7. 09/15/14 MRI segment 4A lesion slightly increased in size, minimal fat containing, peripheral enhancement and arterial enhancement. Left portal vein is clearly not involved.   02/19/2015 Surgery   Per notes from [facility] by Dr. Dennison Nancy L lobe of liver partial hepatectomy segments 2, 3, 4a, 4b invasive cholangiocarcinoma, moderately differentiated. Tumor focally extends to the parenchymal margin of resection. Uninvolved liver parenchyma with steatohepatitis and associated periportal and centrilobular  pericellular fibrosis. Scattered von Meyenburg complexes. One hepatic artery LN with metastatic cholangiocarcinoma. Fibroadipose tissue and nerve negative for malignancy. Gallbladder negative for malignancy. One LN negative for malignancy. T2aN1 invasive cholangiocarcinoma, moderately differentiated, with positive hepatic parenchymal margin, and 1/2 nodes positive for tumor involvement. -Post operative course was complicated by fevers felt to be related to atelectasis   04/17/2015 Concurrent Chemotherapy   Per notes from [place] by Dr. Dennison Nancy -04/17/15-05/26/15 Chemoradiation with capecitabine (locally) 50.4 Gy in 28 fractions.  -Patient started getting sick, hospitalized, fluids twice, was on ondansetron, and propranolol, and sucralfate  -EGD found ulcers in stomach  -06/14/15 CTA (per Dr. Shayne Alken note) no evidence of disease recurrence.  Formation of the stomach and duodenum was seen.   08/2015 - 2017 Chemotherapy   08/2015 4 cycles of gemcitabine.  Patient intolerant to this treatment with weight loss, anorexia, and fatigue.    10/16/2015 Surgery   Per notes from [place] by Dr. Dennison Nancy -CTA chest abdomen and pelvis status post left hepatectomy with no evidence of recurrent disease or metastatic disease.  Right lower lobe pulmonary nodules unchanged.  CA 19-9 7   08/11/2017 Pathology Results   Per notes from [place] by Dr. Dennison Nancy -07/29/17 CT abd -U/S biopsy of liver    08/27/2017 Imaging   Per note from [facility] by Dr. Dennison Nancy --08/25/17 CT chest, abdomen, and pelvis ill-defined hypoenhancing lesion in the inferior right liver concerning for m metastatic disease. Portocaval node measuring up to 1.2 cm in short axis   08/28/2017 Pathology Results   Per note from [facility] by Dr. Dennison Nancy -Invasive moderate Truman Hayward differentiated adenocarcinoma morphologically compatible with cholangio- -09/02/17 [facility]DC consensus appears consistent with intrahepatic cholangiocarcinoma and surgical  resection not appropriate.  If after systemic  therapy she remains stable, no extrahepatic disease, could consider HAI pump in future.  Ablation not an option.   09/24/2017 - 10/15/2017 Radiation Therapy   Per note from [facility] by Dr. Dennison Nancy -XRT x3 weeks   12/30/2017 Imaging   Per note from [facility] by Dr. Dennison Nancy CT chest, abdomen, and pelvis slight interval increase in size of metastatic lesion in the liver with increasing necrosis.  Stable right lower lobe pulmonary nodule and portacaval lymph nodes   10/14/2018 Imaging   Per note from [facility] by Dr. Dennison Nancy CT chest, abdomen, and pelvis decrease size of liver lesions, PVT new, 1 to centimeter lymph node   05/14/2019 Imaging   Per note from [facility] by Dr. Dennison Nancy -CT infiltrative mass within the porta hepatis encasing the portal triads measuring up to 4.1 cm, concerning for residual/recurrent cholangiocarcinoma.  There is worsening thrombosis/tumor in the vein involving the entire portal venous system now extending into the right portal vein and distal aspect of the splenic vein.  There are at least 6 new liver lesions concerning for multifocal hepatic metastases.  Stable retroperitoneal lymphadenopathy.  Similar appearing distal gastric wall thickening, likely post radiation changes.  Stable subcentimeter pulmonary nodules, the largest within the right lower lobe measuring 0.8 cm.  Recommend continued attention to follow-up.   05/14/2019 Cancer Staging   Staging form: Intrahepatic Bile Duct, AJCC 8th Edition - Clinical stage from 05/14/2019: Stage IV (cTX, cNX, cM1) - Signed by Truitt Merle, MD on 06/20/2019   06/21/2019 - 08/02/2019 Chemotherapy   First-line 5-fluorouracil and leucovorin IV every 2 weeks starting 06/21/19. Chemo break since 08/02/19 due to poor tolerance. She declined restarting    08/12/2019 Procedure   Colonosocpy by Dr [name]  - There were several lipomas spread throughout the colon. The largest was in the sigmoid (pedunculated 2-3cm, slightly inflamed at the tip). This was unchanged from 2015,  the previous submucosal tattoo was still evident. - Internal hemorrhoids (this is likely the cause of your minor rectal bleeding). - Diverticulosis in the left colon. - The examination was otherwise normal on direct and retroflexion views. - No polyps or cancers.   08/17/2019 Imaging   CT AP W contrast  IMPRESSION: 1. Multifocal hepatic malignancy with multiple new enhancing lesions in the RIGHT hepatic lobe compared to CT 07/29/2017 (interim CT not available). 2. Post LEFT hepatectomy. 3. Poorly defined tissue planes in the porta hepatis concerning for tumor infiltration of the porta hepatis. 4. 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 5. Probable periportal adenopathy but difficult to define tissue planes 6. No disease outside the liver/porta hepatis identified.        CURRENT THERAPY:  Continue treatment break   INTERVAL HISTORY:  Bonnie Peterson is here for a follow up. She presents to the clinic alone. Her daughter Kennyth Lose was called to be included in the visit. She notes she feels much better during her chemo break. She is not interested in restarting chemo. She notes with more rest and drinking more fluids this helped her improve. She notes rarely getting nausea now. She notes she has 3 BM a day. She feels she does not want to gain much more weight and does not want to lose more.  She notes when she was in [place] and treated with Radiation. She notes in 02/2019 she was seen to have blood clots from  GI bleeding. Her following colonoscopy showed her bleeding likely from her hemorrhoids.     REVIEW OF SYSTEMS:   Constitutional: Denies fevers, chills or abnormal weight loss Eyes: Denies blurriness of vision Ears, nose, mouth, throat, and face: Denies mucositis or sore throat Respiratory: Denies cough, dyspnea or wheezes Cardiovascular: Denies palpitation, chest discomfort  or lower extremity swelling Gastrointestinal:  Denies nausea, heartburn or change in bowel habits Skin: Denies abnormal skin rashes Lymphatics: Denies new lymphadenopathy or easy bruising Neurological:Denies numbness, tingling or new weaknesses Behavioral/Psych: Mood is stable, no new changes  All other systems were reviewed with the patient and are negative.  MEDICAL HISTORY:  Past Medical History:  Diagnosis Date  . Anxiety   . Benign positional vertigo   . Cancer ([place]) 02/09/15   intrahepatic cholangiocarcinoma  . COPD (chronic obstructive pulmonary disease) ([place])   . Depression   . Diabetes mellitus without complication ([place])   . Early cataracts, bilateral 10/13   Optho, Dr Gershon Crane  . Echocardiogram findings abnormal, without diagnosis 10/10   10/10: mild pulm HTN, EF 60-65%, mild LVH, moderate aortic regurg  . GERD (gastroesophageal reflux disease)    I have "acid reflux"  . Gout   . HLD (hyperlipidemia)   . HTN, goal below 130/80   . Irritable bowel syndrome   . Obesity, Class III, BMI 40-49.9 (morbid obesity) ([place])   . Obstructive sleep apnea    wears cpap  . Osteoarthritis (arthritis due to wear and tear of joints)    also gout  . Restrictive lung disease     SURGICAL HISTORY: Past Surgical History:  Procedure Laterality Date  . ABDOMINAL HYSTERECTOMY    . COLONOSCOPY WITH PROPOFOL N/A 08/12/2019   Procedure: COLONOSCOPY WITH PROPOFOL;  Surgeon: [name], MD;  Location: [facility];  Service: Endoscopy;  Laterality: N/A;  . ESOPHAGOGASTRODUODENOSCOPY N/A 06/15/2015   Procedure: ESOPHAGOGASTRODUODENOSCOPY (EGD);  Surgeon: Gatha Mayer, MD;  Location: [facility];  Service: Endoscopy;  Laterality: N/A;  . IR IMAGING GUIDED PORT INSERTION  06/15/2019  . IR REMOVAL TUN ACCESS W/ PORT W/O FL MOD SED  11/11/2016  . LIVER BIOPSY    . OPEN PARTIAL HEPATECTOMY  Left 02/09/15  . PARTIAL HYSTERECTOMY    . ROTATOR CUFF REPAIR       L rotator cuff repair 11/07-[place] -  12/7/200  . TONSILLECTOMY      I have reviewed the social history and family history with the patient and they are unchanged from previous note.  ALLERGIES:  is allergic to other.  MEDICATIONS:  Current Outpatient Medications  Medication Sig Dispense Refill  . albuterol (PROAIR HFA) 108 (90 Base) MCG/ACT inhaler Inhale 1-2 puffs into the lungs every 6 (six) hours as needed for wheezing or shortness of breath. 1 g 3  . alendronate (FOSAMAX) 70 MG tablet TAKE 1 TABLET  ONCE A WEEK. (Patient taking differently: Take 70 mg by mouth every Sunday. TAKE 1 TABLET  ONCE A WEEK.) 12 tablet 2  . amLODipine (NORVASC) 10 MG tablet TAKE 1 TABLET EVERY DAY (Patient taking differently: Take 10 mg by mouth daily. ) 90 tablet 1  . atenolol (TENORMIN) 25 MG tablet Take 1 tablet (25 mg total) by mouth daily. 90 tablet 3  . cholecalciferol (VITAMIN D) 1000 units tablet Take 1 tablet (1,000 Units total) by mouth daily. 30 tablet 11  . diclofenac sodium (VOLTAREN) 1 % GEL Apply 2 g topically 4 (four) times daily as needed. 100 g 4  .  famotidine (PEPCID) 20 MG tablet Take 1 tablet (20 mg total) by mouth 2 (two) times daily. 180 tablet 3  . fish oil-omega-3 fatty acids 1000 MG capsule Take 1 g by mouth every 30 (thirty) days.     . fluticasone (FLONASE) 50 MCG/ACT nasal spray Place 2 sprays into both nostrils daily as needed for rhinitis. 16 g 2  . hydrochlorothiazide (HYDRODIURIL) 25 MG tablet Take 1 tablet (25 mg total) by mouth daily. 90 tablet 3  . Hyprom-Naphaz-Polysorb-Zn Sulf (CLEAR EYES COMPLETE OP) Place 1 drop into both eyes daily as needed (dry eyes).     [name] lidocaine-prilocaine (EMLA) cream Apply 1 application topically every 14 (fourteen) days. Every 14 days as needed (Patient taking differently: Apply 1 application topically as needed (port access). ) 30 g 2  . lisinopril (ZESTRIL) 40 MG tablet Take 1 tablet (40 mg total) by mouth daily. 90 tablet 3  . loratadine (CLARITIN) 10 MG tablet Take 1 tablet (10  mg total) by mouth daily as needed for allergies. 30 tablet 3  . LORazepam (ATIVAN) 0.5 MG tablet Take 0.5-1 tablets (0.25-0.5 mg total) by mouth at bedtime as needed for anxiety. (Patient taking differently: Take 0.5 mg by mouth at bedtime as needed for anxiety. ) 20 tablet 0  . metFORMIN (GLUCOPHAGE) 500 MG tablet TAKE 2 TABLETS TWICE DAILY WITH MEALS (Patient taking differently: Take 1,000 mg by mouth 2 (two) times daily with a meal. TAKE 2 TABLETS TWICE DAILY WITH MEALS) 360 tablet 3  . mirtazapine (REMERON SOL-TAB) 15 MG disintegrating tablet Take 1 tablet (15 mg total) by mouth at bedtime. (Patient not taking: Reported on 07/30/2019) 30 tablet 2  . Multiple Vitamin (MULTIVITAMIN WITH MINERALS) TABS tablet Take 1 tablet by mouth daily.    . Na Sulfate-K Sulfate-Mg Sulf (SUPREP BOWEL PREP KIT) 17.5-3.13-1.6 GM/177ML SOLN Take 1 kit by mouth as directed. 324 mL 0  . ondansetron (ZOFRAN) 4 MG tablet Take 4 mg by mouth every 8 (eight) hours as needed for nausea or vomiting.    . prochlorperazine (COMPAZINE) 10 MG tablet Take 10 mg by mouth every 6 (six) hours as needed for nausea or vomiting.    [name] RIVAROXABAN (XARELTO) VTE STARTER PACK (15 & 20 MG TABLETS) Follow package directions: Take one 65m tablet by mouth twice a day. On day 22, switch to one 238mtablet once a day. Take with food. 51 each 0  . VITAMIN E PO Take 1 tablet by mouth See admin instructions. 2-3 times a month     No current facility-administered medications for this visit.    PHYSICAL EXAMINATION: ECOG PERFORMANCE STATUS: 1 - Symptomatic but completely ambulatory  Vitals:   08/26/19 1337  BP: (!) 157/67  Pulse: 72  Resp: 20  Temp: (!) 97.1 F (36.2 C)  SpO2: 100%   Filed Weights   08/26/19 1337  Weight: 172 lb 11.2 oz (78.3 kg)    Due to COVID19 we will limit examination to appearance. Patient had no complaints.  GENERAL:alert, no distress and comfortable SKIN: skin color normal, no rashes or significant  lesions EYES: normal, Conjunctiva are pink and non-injected, sclera clear  NEURO: alert & oriented x 3 with fluent speech   LABORATORY DATA:  I have reviewed the data as listed CBC Latest Ref Rng & Units 08/26/2019 08/17/2019 08/09/2019  WBC 4.0 - 10.5 K/uL 8.1 8.5 8.1  Hemoglobin 12.0 - 15.0 g/dL 12.0 11.6(L) 10.9(L)  Hematocrit 36.0 - 46.0 % 38.0 37.7 33.8(L)  Platelets  150 - 400 K/uL 168 153 152.0     CMP Latest Ref Rng & Units 08/26/2019 08/17/2019 08/02/2019  Glucose 70 - 99 mg/dL 183(H) 135(H) 175(H)  BUN 8 - 23 mg/dL _0 Creatinine 0.44 - 1.00 mg/dL 0.86 0.79 0.81  Sodium 135 - 145 mmol/L 137 137 138  Potassium 3.5 - 5.1 mmol/L 4.3 4.6 4.2  Chloride 98 - 111 mmol/L 104 104 104  CO2 22 - 32 mmol/L _1 Calcium 8.9 - 10.3 mg/dL 9.9 8.9 9.3  Total Protein 6.5 - 8.1 g/dL 7.7 7.5 7.2  Total Bilirubin 0.3 - 1.2 mg/dL 0.5 0.4 0.4  Alkaline Phos 38 - 126 U/L 108 122 104  AST 15 - 41 U/L 32 33 33  ALT 0 - 44 U/L _2 RADIOGRAPHIC STUDIES: I have personally reviewed the radiological images as listed and agreed with the findings in the report. No results found.   ASSESSMENT & PLAN:  MAIGEN MOZINGO is a 73 y.o. 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 She was treated with adjuvant concurrent ChemoRT with Xeloda for about 6 weeks.  -Based on 08/2017 CT scan and Liver biopsy at [facility] she had cancer recurrence with metastasis. She was then treated with Radiation for about 3 weeks  -Her 05/14/19 CT from [facility] showsinfiltrative mass within the porta hepatis encasing the portal triads measuring up to 4.1 cm, concerning for residual/recurrent cholangiocarcinoma.She is stage IV and her cancer is no longer curable at this point but still treatable.  -Given poor tolerance to more  intensive treatment in the past,Istartedher on first-line5-fluorouracil and leucovorin IV every 2 weekson3/15/21. Goal of therapy is to control her disease and prolong her life. -She reports she is tolerating chemo poorly, very concerned about her fatigue, low appetite, hair loss and her skin hyperpigmentation in her hands, she has lost about 16 lbs in the past 2 weeks. She is quite overwhelmed and would like to reduce her chemo. We stopped for chemo break on 08/02/19.  -I personally reviewed and discussed her CT AP from 08/17/19 which shows overall cancer progression in liver and possible surrounding LNs compared to her 2019 CT. Will compare current scan to 2020 Scan at [facility] for better evaluation. I have requested outside scan to be pushed over to our system  -She was able to recover well from her last chemo treatment with the chemo break. She is not interested in restarting chemo given her poor tolerance. This is understandable.  -I reviewed her [facility] results which show she would be eligible for target therapy including HER2 antibodies and IDH inhibitor. She is not interested in oral medications. I discussed Herceptin is much more tolerable than chemo. I discussed side effects including very mild skin rash and reversible heart damage.   -I also discussed the option of Y90 radio embolization which is targeted. I discussed this has limited benefit but still a likely option.  -I discussed if she does not wish to proceed with anymore treatment she can proceed with palliative or hospice care to manage any symptoms.   -She and her daughter would like to continue to think about this with her family until she makes a decision about her next steps in treatment. I discussed deciding on treatment before her liver  function decreases and effects her eligibility for Y90 treatment. She voiced a good understanding.  -F/u in 4 weeks    2. Lower appetite, weight loss  -She notes she has not been eating  well on chemo and has mild weight loss.  -She is on Mirtazapine, will continue  -I recommend she increase boost to 2-3 times a day.    3. DM, HTN -Will watch on chemo.We discussed that chemo could affect her BP and BG.I recommend she watch her BP at home.  -She is apprehensive about using BG monitor given concerns of finger numbness. -Her blood pressure and random sugar has been stable lately   4. Mild anemia and thrombocytopenia -Secondary to chemotherapy, overall stable -Resolved off chemo today (08/26/19)  5.Goal of care discussion, DNR/DNI -We again discussed the incurable nature of her cancer, and the overall poor prognosis, especially if she does not have good response to chemotherapy or progress on chemo -The patient understands the goal of care is palliative. -I recommend DNR/DNI, she agrees  -She notes she has 3 remaining children who live in [place] who offered to move her in if she needs more help. She does not want to move in with anyone of her family members and rather have help from social services or home care service. I dicussed this may be out of pocket cost.  -She notes she does not have living will set up, I encouraged her to set up will and POA (her son) to direct her medical care.   6. Portal Vein Thrombosis  -Seen on 08/17/19 she has thrombosis involving the main portal vein and right portal vein extending retrograde into the most distal aspect the splenic vein. -I discussed thrombosis hear is common for those with gallbladder cancer.  -To manage this I discussed treatment with blood thinner. We reviewed Xarelto, Eliquis, Lovenox or Coumadin which would require blood test. She was on Xarelto before and willing to try again if affordable to her. I will call in today (08/26/19).  -She did have h/o hematochezia, will monitor.    PLAN:  -CT AP reviewed -I called in Xarelto today  -Lab, flush, F/u in 4 weeks  -I spoke with her daughter on the phone today   -tumor board discussion   No problem-specific Assessment & Plan notes found for this encounter.   No orders of the defined types were placed in this encounter.  All questions were answered. The patient knows to call the clinic with any problems, questions or concerns. No barriers to learning was detected. The total time spent in the appointment was 45 minutes.     Truitt Merle, MD 08/26/2019   I, Joslyn Devon, am acting as scribe for Truitt Merle, MD.   I have reviewed the above documentation for accuracy and completeness, and I agree with the above.

## 2019-08-25 NOTE — Telephone Encounter (Signed)
This patient has terminal cancer so not good candidate for statin. Thank you.

## 2019-08-26 ENCOUNTER — Inpatient Hospital Stay: Payer: Medicare HMO

## 2019-08-26 ENCOUNTER — Inpatient Hospital Stay: Payer: Medicare HMO | Admitting: Hematology

## 2019-08-26 ENCOUNTER — Encounter: Payer: Self-pay | Admitting: Hematology

## 2019-08-26 ENCOUNTER — Other Ambulatory Visit: Payer: Self-pay

## 2019-08-26 VITALS — BP 157/67 | HR 72 | Temp 97.1°F | Resp 20 | Ht 62.0 in | Wt 172.7 lb

## 2019-08-26 DIAGNOSIS — Z452 Encounter for adjustment and management of vascular access device: Secondary | ICD-10-CM | POA: Diagnosis not present

## 2019-08-26 DIAGNOSIS — C221 Intrahepatic bile duct carcinoma: Secondary | ICD-10-CM

## 2019-08-26 DIAGNOSIS — D649 Anemia, unspecified: Secondary | ICD-10-CM | POA: Diagnosis not present

## 2019-08-26 DIAGNOSIS — F419 Anxiety disorder, unspecified: Secondary | ICD-10-CM | POA: Diagnosis not present

## 2019-08-26 DIAGNOSIS — F329 Major depressive disorder, single episode, unspecified: Secondary | ICD-10-CM | POA: Diagnosis not present

## 2019-08-26 DIAGNOSIS — G4733 Obstructive sleep apnea (adult) (pediatric): Secondary | ICD-10-CM | POA: Diagnosis not present

## 2019-08-26 DIAGNOSIS — E785 Hyperlipidemia, unspecified: Secondary | ICD-10-CM | POA: Diagnosis not present

## 2019-08-26 DIAGNOSIS — Z95828 Presence of other vascular implants and grafts: Secondary | ICD-10-CM

## 2019-08-26 DIAGNOSIS — E119 Type 2 diabetes mellitus without complications: Secondary | ICD-10-CM | POA: Diagnosis not present

## 2019-08-26 LAB — CBC WITH DIFFERENTIAL (CANCER CENTER ONLY)
Abs Immature Granulocytes: 0.02 10*3/uL (ref 0.00–0.07)
Basophils Absolute: 0 10*3/uL (ref 0.0–0.1)
Basophils Relative: 1 %
Eosinophils Absolute: 0.1 10*3/uL (ref 0.0–0.5)
Eosinophils Relative: 1 %
HCT: 38 % (ref 36.0–46.0)
Hemoglobin: 12 g/dL (ref 12.0–15.0)
Immature Granulocytes: 0 %
Lymphocytes Relative: 17 %
Lymphs Abs: 1.4 10*3/uL (ref 0.7–4.0)
MCH: 29.2 pg (ref 26.0–34.0)
MCHC: 31.6 g/dL (ref 30.0–36.0)
MCV: 92.5 fL (ref 80.0–100.0)
Monocytes Absolute: 0.8 10*3/uL (ref 0.1–1.0)
Monocytes Relative: 10 %
Neutro Abs: 5.8 10*3/uL (ref 1.7–7.7)
Neutrophils Relative %: 71 %
Platelet Count: 168 10*3/uL (ref 150–400)
RBC: 4.11 MIL/uL (ref 3.87–5.11)
RDW: 18.2 % — ABNORMAL HIGH (ref 11.5–15.5)
WBC Count: 8.1 10*3/uL (ref 4.0–10.5)
nRBC: 0 % (ref 0.0–0.2)

## 2019-08-26 LAB — CMP (CANCER CENTER ONLY)
ALT: 19 U/L (ref 0–44)
AST: 32 U/L (ref 15–41)
Albumin: 3.2 g/dL — ABNORMAL LOW (ref 3.5–5.0)
Alkaline Phosphatase: 108 U/L (ref 38–126)
Anion gap: 8 (ref 5–15)
BUN: 10 mg/dL (ref 8–23)
CO2: 25 mmol/L (ref 22–32)
Calcium: 9.9 mg/dL (ref 8.9–10.3)
Chloride: 104 mmol/L (ref 98–111)
Creatinine: 0.86 mg/dL (ref 0.44–1.00)
GFR, Est AFR Am: >60 mL/min (ref >60)
GFR, Estimated: >60 mL/min (ref >60)
Glucose, Bld: 183 mg/dL — ABNORMAL HIGH (ref 70–99)
Potassium: 4.3 mmol/L (ref 3.5–5.1)
Sodium: 137 mmol/L (ref 135–145)
Total Bilirubin: 0.5 mg/dL (ref 0.3–1.2)
Total Protein: 7.7 g/dL (ref 6.5–8.1)

## 2019-08-26 MED ORDER — RIVAROXABAN (XARELTO) VTE STARTER PACK (15 & 20 MG)
ORAL_TABLET | ORAL | 0 refills | Status: DC
Start: 2019-08-26 — End: 2020-02-23

## 2019-08-26 MED ORDER — SODIUM CHLORIDE 0.9% FLUSH
10.0000 mL | INTRAVENOUS | Status: DC | PRN
  Administered 2019-08-26: 10 mL
  Filled 2019-08-26: qty 10

## 2019-08-26 MED ORDER — HEPARIN SOD (PORK) LOCK FLUSH 100 UNIT/ML IV SOLN
500.0000 [IU] | Freq: Once | INTRAVENOUS | Status: AC | PRN
  Administered 2019-08-26: 500 [IU]
  Filled 2019-08-26: qty 5

## 2019-08-26 MED FILL — XARELTO STARTER PACK: 15 & 20 | 30 days supply | Qty: 51 | Fill #0

## 2019-08-26 NOTE — Patient Instructions (Signed)

## 2019-08-27 ENCOUNTER — Telehealth: Payer: Self-pay | Admitting: Hematology

## 2019-08-27 NOTE — Telephone Encounter (Signed)
Scheduled appt per 5/20 los.  Spoke with pt and they are aware of the appt date and time.

## 2019-08-30 ENCOUNTER — Encounter: Payer: Self-pay | Admitting: Hematology

## 2019-08-31 ENCOUNTER — Telehealth: Payer: Self-pay

## 2019-08-31 NOTE — Telephone Encounter (Signed)
Faxed request to [facility] for images to be pushed over from Ct CAP done on 05/14/2019.

## 2019-09-07 ENCOUNTER — Telehealth: Payer: Self-pay | Admitting: *Deleted

## 2019-09-07 NOTE — Telephone Encounter (Signed)
Received fax from [facility] that states pt may be a good candidate for statin therapy. The recommended medications are atorvastatin, rosuvastatin, pravastatin or lovastatin.  It said to please send Rx if you feel your pt should be on one of these.  Devyne Hauger Zimmerman Rumple, CMA

## 2019-09-08 NOTE — Telephone Encounter (Signed)
Thank you, Bonnie Peterson. I think I received a similar request recently. She has terminal metastatic cancer so I do not feel it would be appropriate to start her on this medication.

## 2019-09-10 ENCOUNTER — Ambulatory Visit: Payer: Medicare HMO | Admitting: Gastroenterology

## 2019-09-21 NOTE — Progress Notes (Signed)
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 Mettu 1. 2014 Mass in the left side of the liver incidentally found during work up for acute pancreatitis.  The mass was thought to be a bile duct lesion and was followed up.  2. 02/2013 MRI hyperdense focus within the left hepatic lobe.  3. 02/21/13 CT scan 3.7 x 2.1 x 2.4 cm arterial enhancing lesion.  AFP and CEA were normal.  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 4. 05/18/12 Biopsy of something (surg path in Care Everywhere, but no report) 5. 07/02/13 Biopsy of something (surg path in Care Everywhere, but no report) 6. 09/02/13 MRI lesion in segment 4A with adjacent perfusional enhancement with some washout. Lesion is close to left portal vein. 7. 09/15/14 MRI segment 4A lesion slightly increased in size, minimal fat containing, peripheral enhancement and arterial enhancement. Left portal vein is clearly not involved.   02/19/2015 Surgery   Per notes from [facility] by Dr. Dennison Nancy L lobe of liver partial hepatectomy segments 2, 3, 4a, 4b invasive cholangiocarcinoma, moderately differentiated. Tumor focally extends to the parenchymal margin of resection. Uninvolved liver parenchyma with steatohepatitis and associated periportal and centrilobular  pericellular fibrosis. Scattered von Meyenburg complexes. One hepatic artery LN with metastatic cholangiocarcinoma. Fibroadipose tissue and nerve negative for malignancy. Gallbladder negative for malignancy. One LN negative for malignancy. T2aN1 invasive cholangiocarcinoma, moderately differentiated, with positive hepatic parenchymal margin, and 1/2 nodes positive for tumor involvement. -Post operative course was complicated by fevers felt to be related to atelectasis   04/17/2015 Concurrent Chemotherapy   Per notes from [place] by Dr. Dennison Nancy -04/17/15-05/26/15 Chemoradiation with capecitabine (locally) 50.4 Gy in 28 fractions.  -Patient started getting sick, hospitalized, fluids twice, was on ondansetron, and propranolol, and sucralfate  -EGD found ulcers in stomach  -06/14/15 CTA (per Dr. Shayne Alken note) no evidence of disease recurrence.  Formation of the stomach and duodenum was seen.   08/2015 - 2017 Chemotherapy   08/2015 4 cycles of gemcitabine.  Patient intolerant to this treatment with weight loss, anorexia, and fatigue.    10/16/2015 Surgery   Per notes from [place] by Dr. Dennison Nancy -CTA chest abdomen and pelvis status post left hepatectomy with no evidence of recurrent disease or metastatic disease.  Right lower lobe pulmonary nodules unchanged.  CA 19-9 7   08/11/2017 Pathology Results   Per notes from [place] by Dr. Dennison Nancy -07/29/17 CT abd -U/S biopsy of liver    08/27/2017 Imaging   Per note from [facility] by Dr. Dennison Nancy --08/25/17 CT chest, abdomen, and pelvis ill-defined hypoenhancing lesion in the inferior right liver concerning for m metastatic disease. Portocaval node measuring up to 1.2 cm in short axis   08/28/2017 Pathology Results   Per note from [facility] by Dr. Dennison Nancy -Invasive moderate Truman Hayward differentiated adenocarcinoma morphologically compatible with cholangio- -09/02/17 MDC consensus appears consistent with intrahepatic cholangiocarcinoma and surgical  resection not appropriate.  If after systemic  therapy she remains stable, no extrahepatic disease, could consider HAI pump in future.  Ablation not an option.   09/24/2017 - 10/15/2017 Radiation Therapy   Per note from [facility] by Dr. Dennison Nancy -XRT x3 weeks   12/30/2017 Imaging   Per note from [facility] by Dr. Dennison Nancy CT chest, abdomen, and pelvis slight interval increase in size of metastatic lesion in the liver with increasing necrosis.  Stable right lower lobe pulmonary nodule and portacaval lymph nodes   10/14/2018 Imaging   Per note from [facility] by Dr. Dennison Nancy CT chest, abdomen, and pelvis decrease size of liver lesions, PVT new, 1 to centimeter lymph node   05/14/2019 Imaging   Per note from [facility] by Dr. Dennison Nancy -CT infiltrative mass within the porta hepatis encasing the portal triads measuring up to 4.1 cm, concerning for residual/recurrent cholangiocarcinoma.  There is worsening thrombosis/tumor in the vein involving the entire portal venous system now extending into the right portal vein and distal aspect of the splenic vein.  There are at least 6 new liver lesions concerning for multifocal hepatic metastases.  Stable retroperitoneal lymphadenopathy.  Similar appearing distal gastric wall thickening, likely post radiation changes.  Stable subcentimeter pulmonary nodules, the largest within the right lower lobe measuring 0.8 cm.  Recommend continued attention to follow-up.   05/14/2019 Cancer Staging   Staging form: Intrahepatic Bile Duct, AJCC 8th Edition - Clinical stage from 05/14/2019: Stage IV (cTX, cNX, cM1) - Signed by Truitt Merle, MD on 06/20/2019   06/21/2019 - 08/02/2019 Chemotherapy   First-line 5-fluorouracil and leucovorin IV every 2 weeks starting 06/21/19. Chemo break since 08/02/19 due to poor tolerance. She declined restarting    08/12/2019 Procedure   Colonosocpy by Dr [name]  - There were several lipomas spread throughout the colon. The largest was in the sigmoid (pedunculated 2-3cm, slightly inflamed at the tip). This was unchanged from 2015,  the previous submucosal tattoo was still evident. - Internal hemorrhoids (this is likely the cause of your minor rectal bleeding). - Diverticulosis in the left colon. - The examination was otherwise normal on direct and retroflexion views. - No polyps or cancers.   08/17/2019 Imaging   CT AP W contrast  IMPRESSION: 1. Multifocal hepatic malignancy with multiple new enhancing lesions in the RIGHT hepatic lobe compared to CT 07/29/2017 (interim CT not available). 2. Post LEFT hepatectomy. 3. Poorly defined tissue planes in the porta hepatis concerning for tumor infiltration of the porta hepatis. 4. 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 5. Probable periportal adenopathy but difficult to define tissue planes 6. No disease outside the liver/porta hepatis identified.        CURRENT THERAPY:  Observation   INTERVAL HISTORY:  Bonnie Peterson is here for a follow up. She presents to the clinic alone. She notes she is feeling better lately. She notes she has more energy. She will eat 1/3 of the normal amount of food. Her appetite is still low. She notes she feels full easily and burping more often. She notes she is still taking Mirtazapine. She notes when she does not eat she will take Boost once daily. She notes she is adamant about continuing observation without treatment. She feels better off treatment currently.    REVIEW OF SYSTEMS:   Constitutional: Denies fevers, chills (+) Low appetite and mild weight loss Eyes: Denies blurriness of vision Ears,  nose, mouth, throat, and face: Denies mucositis or sore throat Respiratory: Denies cough, dyspnea or wheezes Cardiovascular: Denies palpitation, chest discomfort or lower extremity swelling Gastrointestinal:  Denies nausea, heartburn or change in bowel habits (+) early satiety, burping  Skin: Denies abnormal skin  rashes Lymphatics: Denies new lymphadenopathy or easy bruising Neurological:Denies numbness, tingling or new weaknesses Behavioral/Psych: Mood is stable, no new changes  All other systems were reviewed with the patient and are negative.  MEDICAL HISTORY:  Past Medical History:  Diagnosis Date  . Anxiety   . Benign positional vertigo   . Cancer ([place]) 02/09/15   intrahepatic cholangiocarcinoma  . COPD (chronic obstructive pulmonary disease) ([place])   . Depression   . Diabetes mellitus without complication ([place])   . Early cataracts, bilateral 10/13   Optho, Dr Gershon Crane  . Echocardiogram findings abnormal, without diagnosis 10/10   10/10: mild pulm HTN, EF 60-65%, mild LVH, moderate aortic regurg  . GERD (gastroesophageal reflux disease)    I have "acid reflux"  . Gout   . HLD (hyperlipidemia)   . HTN, goal below 130/80   . Irritable bowel syndrome   . Obesity, Class III, BMI 40-49.9 (morbid obesity) ([place])   . Obstructive sleep apnea    wears cpap  . Osteoarthritis (arthritis due to wear and tear of joints)    also gout  . Restrictive lung disease     SURGICAL HISTORY: Past Surgical History:  Procedure Laterality Date  . ABDOMINAL HYSTERECTOMY    . COLONOSCOPY WITH PROPOFOL N/A 08/12/2019   Procedure: COLONOSCOPY WITH PROPOFOL;  Surgeon: [name], MD;  Location: [facility];  Service: Endoscopy;  Laterality: N/A;  . ESOPHAGOGASTRODUODENOSCOPY N/A 06/15/2015   Procedure: ESOPHAGOGASTRODUODENOSCOPY (EGD);  Surgeon: Gatha Mayer, MD;  Location: [facility];  Service: Endoscopy;  Laterality: N/A;  . IR IMAGING GUIDED PORT INSERTION  06/15/2019  . IR REMOVAL TUN ACCESS W/ PORT W/O FL MOD SED  11/11/2016  . LIVER BIOPSY    . OPEN PARTIAL HEPATECTOMY  Left 02/09/15  . PARTIAL HYSTERECTOMY    . ROTATOR CUFF REPAIR       L rotator cuff repair 11/07-[place] - 12/7/200  . TONSILLECTOMY      I have reviewed the social history and family history with the patient and they are  unchanged from previous note.  ALLERGIES:  is allergic to other.  MEDICATIONS:  Current Outpatient Medications  Medication Sig Dispense Refill  . albuterol (PROAIR HFA) 108 (90 Base) MCG/ACT inhaler Inhale 1-2 puffs into the lungs every 6 (six) hours as needed for wheezing or shortness of breath. 1 g 3  . alendronate (FOSAMAX) 70 MG tablet TAKE 1 TABLET  ONCE A WEEK. (Patient taking differently: Take 70 mg by mouth every Sunday. TAKE 1 TABLET  ONCE A WEEK.) 12 tablet 2  . amLODipine (NORVASC) 10 MG tablet TAKE 1 TABLET EVERY DAY (Patient taking differently: Take 10 mg by mouth daily. ) 90 tablet 1  . atenolol (TENORMIN) 25 MG tablet Take 1 tablet (25 mg total) by mouth daily. 90 tablet 3  . cholecalciferol (VITAMIN D) 1000 units tablet Take 1 tablet (1,000 Units total) by mouth daily. 30 tablet 11  . diclofenac sodium (VOLTAREN) 1 % GEL Apply 2 g topically 4 (four) times daily as needed. 100 g 4  . famotidine (PEPCID) 20 MG tablet Take 1 tablet (20 mg total) by mouth 2 (two) times daily. 180 tablet 3  . fish oil-omega-3 fatty acids 1000 MG capsule Take  1 g by mouth every 30 (thirty) days.     . fluticasone (FLONASE) 50 MCG/ACT nasal spray Place 2 sprays into both nostrils daily as needed for rhinitis. 16 g 2  . hydrochlorothiazide (HYDRODIURIL) 25 MG tablet Take 1 tablet (25 mg total) by mouth daily. 90 tablet 3  . Hyprom-Naphaz-Polysorb-Zn Sulf (CLEAR EYES COMPLETE OP) Place 1 drop into both eyes daily as needed (dry eyes).     [name] lidocaine-prilocaine (EMLA) cream Apply 1 application topically every 14 (fourteen) days. Every 14 days as needed (Patient taking differently: Apply 1 application topically as needed (port access). ) 30 g 2  . lisinopril (ZESTRIL) 40 MG tablet Take 1 tablet (40 mg total) by mouth daily. 90 tablet 3  . loratadine (CLARITIN) 10 MG tablet Take 1 tablet (10 mg total) by mouth daily as needed for allergies. 30 tablet 3  . LORazepam (ATIVAN) 0.5 MG tablet Take 0.5-1 tablets  (0.25-0.5 mg total) by mouth at bedtime as needed for anxiety. (Patient taking differently: Take 0.5 mg by mouth at bedtime as needed for anxiety. ) 20 tablet 0  . metFORMIN (GLUCOPHAGE) 500 MG tablet TAKE 2 TABLETS TWICE DAILY WITH MEALS (Patient taking differently: Take 1,000 mg by mouth 2 (two) times daily with a meal. TAKE 2 TABLETS TWICE DAILY WITH MEALS) 360 tablet 3  . mirtazapine (REMERON SOL-TAB) 15 MG disintegrating tablet Take 1 tablet (15 mg total) by mouth at bedtime. (Patient not taking: Reported on 07/30/2019) 30 tablet 2  . Multiple Vitamin (MULTIVITAMIN WITH MINERALS) TABS tablet Take 1 tablet by mouth daily.    . Na Sulfate-K Sulfate-Mg Sulf (SUPREP BOWEL PREP KIT) 17.5-3.13-1.6 GM/177ML SOLN Take 1 kit by mouth as directed. 324 mL 0  . ondansetron (ZOFRAN) 4 MG tablet Take 4 mg by mouth every 8 (eight) hours as needed for nausea or vomiting.    . prochlorperazine (COMPAZINE) 10 MG tablet Take 10 mg by mouth every 6 (six) hours as needed for nausea or vomiting.    [name] RIVAROXABAN (XARELTO) VTE STARTER PACK (15 & 20 MG TABLETS) Follow package directions: Take one 45m tablet by mouth twice a day. On day 22, switch to one 246mtablet once a day. Take with food. 51 each 0  . VITAMIN E PO Take 1 tablet by mouth See admin instructions. 2-3 times a month     No current facility-administered medications for this visit.    PHYSICAL EXAMINATION: ECOG PERFORMANCE STATUS: 2 - Symptomatic, <50% confined to bed  Vitals:   09/23/19 1323  BP: (!) 165/58  Pulse: 65  Resp: 17  Temp: 97.9 F (36.6 C)  SpO2: 98%   Filed Weights   09/23/19 1323  Weight: 170 lb 3.2 oz (77.2 kg)    Due to COVID19 we will limit examination to appearance. Patient had no complaints.  GENERAL:alert, no distress and comfortable SKIN: skin color normal, no rashes or significant lesions EYES: normal, Conjunctiva are pink and non-injected, sclera clear  NEURO: alert & oriented x 3 with fluent  speech   LABORATORY DATA:  I have reviewed the data as listed CBC Latest Ref Rng & Units 09/23/2019 08/26/2019 08/17/2019  WBC 4.0 - 10.5 K/uL 6.8 8.1 8.5  Hemoglobin 12.0 - 15.0 g/dL 11.5(L) 12.0 11.6(L)  Hematocrit 36 - 46 % 36.4 38.0 37.7  Platelets 150 - 400 K/uL 132(L) 168 153     CMP Latest Ref Rng & Units 08/26/2019 08/17/2019 08/02/2019  Glucose 70 - 99 mg/dL 183(H) 135(H) 175(H)  BUN 8 - 23 mg/dL _0 Creatinine 0.44 - 1.00 mg/dL 0.86 0.79 0.81  Sodium 135 - 145 mmol/L 137 137 138  Potassium 3.5 - 5.1 mmol/L 4.3 4.6 4.2  Chloride 98 - 111 mmol/L 104 104 104  CO2 22 - 32 mmol/L _1 Calcium 8.9 - 10.3 mg/dL 9.9 8.9 9.3  Total Protein 6.5 - 8.1 g/dL 7.7 7.5 7.2  Total Bilirubin 0.3 - 1.2 mg/dL 0.5 0.4 0.4  Alkaline Phos 38 - 126 U/L 108 122 104  AST 15 - 41 U/L 32 33 33  ALT 0 - 44 U/L _2 RADIOGRAPHIC STUDIES: I have personally reviewed the radiological images as listed and agreed with the findings in the report. No results found.   ASSESSMENT & PLAN:  CEANNA WAREING is a 74 y.o. 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 She was treated with adjuvant concurrent ChemoRT with Xeloda for about 6 weeks.  -Based on 08/2017 CT scan and Liver biopsy at [facility] she had cancer recurrence with metastasis. She was then treated with Radiation for about 3 weeks  -Her 05/14/19 CT from [facility] showsinfiltrative mass within the porta hepatis encasing the portal triads measuring up to 4.1 cm, concerning for residual/recurrent cholangiocarcinoma.She is stage IV and her cancer is no longer curable at this point but still treatable.  -Given poor tolerance to more intensive treatment in the past,Istartedher on first-line5-fluorouracil and leucovorin IV every 2 weekson3/15/21.  Due to poor toleration, we stopped for chemo break on 08/02/19. Will proceed with observation.  -I previously reviewed her [facility] results which show she would be eligible for target therapy including HER2 antibodies and IDH inhibitor. I also discussed the option of Y90 radio embolization which is targeted. I discussed these have limited benefit but still a likely option. She declined further treatment and would like to continue with observation.  -I discussed if she is not on treatment I do not plan to repeat scan unless she develops concerning symptoms or if she changes her mind on treatment.  -She continues to manage off chemo well with no significant cancer related symptoms except some fatigue and low appetite. Her weight is trending down. I reviewed diet and supplement use. Labs reviewed with her.  -I discussed watching for symptoms that will occur with cancer progression. I discussed referring her to palliaitve care to help her at home. She declined -F/u in 2 months and she will f/u with [facility] in interim.   2. Lower appetite, weight loss  -She notes she has not been eating well on chemo and has mild weight loss.  -Her appetite has not improved off chemo. She still eats 1/3 of usual meals along with early satiety and burping. Her weight is trending down  -She will continue Mirtazapine and I recommend she increase boost to 2-3 times a day.   3. DM, HTN -Will watch on chemo.We discussed that chemo could affect her BP and BG.I recommend she watch her BP at home.  -She is apprehensive about using BG monitor given concerns of finger numbness. -Her blood pressure and random sugar has been stable lately  4. Mild anemia and thrombocytopenia -Secondary to chemotherapy -Hg 11.5, thrombocytopenia resolved off chemo (09/22/19)   5.Goal of care discussion, DNR/DNI -We previously  discussed the incurable nature of her cancer, and the overall poor prognosis, especially if she does not have  good response to chemotherapy or progress on chemo -The patient understands the goal of care is palliative. -I recommend DNR/DNI, she agrees  -She notes she has 3 remaining children who live in [place] who offered to move her in if she needs more help. She does not want to move in with anyone of her family members and rather have help from social services or home care service. I dicussed this may be out of pocket cost.  -She notes she does not have living will set up, I encouraged her to set up will and POA (her son) to direct her medical care.   6. Portal Vein Thrombosis  -Seen on 08/17/19 she has thrombosis involving the main portal vein and right portal vein extending retrograde into the most distal aspect the splenic vein. -I discussed thrombosis hear is common for those with gallbladder cancer.  -To manage this I restarted her on Xarelto (08/26/19).  -She did have h/o hematochezia, will monitor.    PLAN:  -continue supportive care, off cancer treatment  -Lab and F/u in 2 months    No problem-specific Assessment & Plan notes found for this encounter.   No orders of the defined types were placed in this encounter.  All questions were answered. The patient knows to call the clinic with any problems, questions or concerns. No barriers to learning was detected. The total time spent in the appointment was 20 minutes.     Truitt Merle, MD 09/23/2019   I, Joslyn Devon, am acting as scribe for Truitt Merle, MD.   I have reviewed the above documentation for accuracy and completeness, and I agree with the above.

## 2019-09-22 ENCOUNTER — Other Ambulatory Visit: Payer: Self-pay

## 2019-09-22 DIAGNOSIS — C221 Intrahepatic bile duct carcinoma: Secondary | ICD-10-CM

## 2019-09-23 ENCOUNTER — Encounter: Payer: Self-pay | Admitting: Hematology

## 2019-09-23 ENCOUNTER — Inpatient Hospital Stay: Payer: Medicare HMO | Attending: Physician Assistant | Admitting: Hematology

## 2019-09-23 ENCOUNTER — Other Ambulatory Visit: Payer: Self-pay

## 2019-09-23 ENCOUNTER — Inpatient Hospital Stay: Payer: Medicare HMO

## 2019-09-23 VITALS — BP 165/58 | HR 65 | Temp 97.9°F | Resp 17 | Ht 62.0 in | Wt 170.2 lb

## 2019-09-23 DIAGNOSIS — Z452 Encounter for adjustment and management of vascular access device: Secondary | ICD-10-CM | POA: Insufficient documentation

## 2019-09-23 DIAGNOSIS — Z79899 Other long term (current) drug therapy: Secondary | ICD-10-CM | POA: Insufficient documentation

## 2019-09-23 DIAGNOSIS — Z7984 Long term (current) use of oral hypoglycemic drugs: Secondary | ICD-10-CM | POA: Insufficient documentation

## 2019-09-23 DIAGNOSIS — C221 Intrahepatic bile duct carcinoma: Secondary | ICD-10-CM

## 2019-09-23 DIAGNOSIS — Z9221 Personal history of antineoplastic chemotherapy: Secondary | ICD-10-CM | POA: Insufficient documentation

## 2019-09-23 DIAGNOSIS — Z923 Personal history of irradiation: Secondary | ICD-10-CM | POA: Insufficient documentation

## 2019-09-23 DIAGNOSIS — Z95828 Presence of other vascular implants and grafts: Secondary | ICD-10-CM

## 2019-09-23 DIAGNOSIS — Z7901 Long term (current) use of anticoagulants: Secondary | ICD-10-CM | POA: Insufficient documentation

## 2019-09-23 DIAGNOSIS — R634 Abnormal weight loss: Secondary | ICD-10-CM | POA: Diagnosis not present

## 2019-09-23 DIAGNOSIS — M1990 Unspecified osteoarthritis, unspecified site: Secondary | ICD-10-CM | POA: Insufficient documentation

## 2019-09-23 DIAGNOSIS — J449 Chronic obstructive pulmonary disease, unspecified: Secondary | ICD-10-CM | POA: Insufficient documentation

## 2019-09-23 DIAGNOSIS — Z791 Long term (current) use of non-steroidal anti-inflammatories (NSAID): Secondary | ICD-10-CM | POA: Insufficient documentation

## 2019-09-23 DIAGNOSIS — D649 Anemia, unspecified: Secondary | ICD-10-CM | POA: Insufficient documentation

## 2019-09-23 DIAGNOSIS — I81 Portal vein thrombosis: Secondary | ICD-10-CM | POA: Insufficient documentation

## 2019-09-23 DIAGNOSIS — E1136 Type 2 diabetes mellitus with diabetic cataract: Secondary | ICD-10-CM | POA: Insufficient documentation

## 2019-09-23 DIAGNOSIS — G4733 Obstructive sleep apnea (adult) (pediatric): Secondary | ICD-10-CM | POA: Insufficient documentation

## 2019-09-23 DIAGNOSIS — I10 Essential (primary) hypertension: Secondary | ICD-10-CM | POA: Diagnosis not present

## 2019-09-23 DIAGNOSIS — E785 Hyperlipidemia, unspecified: Secondary | ICD-10-CM | POA: Diagnosis not present

## 2019-09-23 DIAGNOSIS — F418 Other specified anxiety disorders: Secondary | ICD-10-CM | POA: Diagnosis not present

## 2019-09-23 DIAGNOSIS — T451X5A Adverse effect of antineoplastic and immunosuppressive drugs, initial encounter: Secondary | ICD-10-CM | POA: Insufficient documentation

## 2019-09-23 LAB — CBC WITH DIFFERENTIAL (CANCER CENTER ONLY)
Abs Immature Granulocytes: 0.01 10*3/uL (ref 0.00–0.07)
Basophils Absolute: 0 10*3/uL (ref 0.0–0.1)
Basophils Relative: 0 %
Eosinophils Absolute: 0.1 10*3/uL (ref 0.0–0.5)
Eosinophils Relative: 1 %
HCT: 36.4 % (ref 36.0–46.0)
Hemoglobin: 11.5 g/dL — ABNORMAL LOW (ref 12.0–15.0)
Immature Granulocytes: 0 %
Lymphocytes Relative: 23 %
Lymphs Abs: 1.6 10*3/uL (ref 0.7–4.0)
MCH: 29.3 pg (ref 26.0–34.0)
MCHC: 31.6 g/dL (ref 30.0–36.0)
MCV: 92.6 fL (ref 80.0–100.0)
Monocytes Absolute: 0.6 10*3/uL (ref 0.1–1.0)
Monocytes Relative: 9 %
Neutro Abs: 4.5 10*3/uL (ref 1.7–7.7)
Neutrophils Relative %: 67 %
Platelet Count: 132 10*3/uL — ABNORMAL LOW (ref 150–400)
RBC: 3.93 MIL/uL (ref 3.87–5.11)
RDW: 15.9 % — ABNORMAL HIGH (ref 11.5–15.5)
WBC Count: 6.8 10*3/uL (ref 4.0–10.5)
nRBC: 0 % (ref 0.0–0.2)

## 2019-09-23 MED ORDER — SODIUM CHLORIDE 0.9% FLUSH
10.0000 mL | INTRAVENOUS | Status: DC | PRN
  Administered 2019-09-23: 10 mL
  Filled 2019-09-23: qty 10

## 2019-09-23 MED ORDER — HEPARIN SOD (PORK) LOCK FLUSH 100 UNIT/ML IV SOLN
500.0000 [IU] | Freq: Once | INTRAVENOUS | Status: AC | PRN
  Administered 2019-09-23: 500 [IU]
  Filled 2019-09-23: qty 5

## 2019-09-24 ENCOUNTER — Telehealth: Payer: Self-pay | Admitting: Hematology

## 2019-09-24 NOTE — Telephone Encounter (Signed)
Scheduled appt per 6/17 los.  Spoke with pt and she is aware of the appt date and time,

## 2019-09-28 DIAGNOSIS — E1151 Type 2 diabetes mellitus with diabetic peripheral angiopathy without gangrene: Secondary | ICD-10-CM | POA: Diagnosis not present

## 2019-09-28 DIAGNOSIS — Z7984 Long term (current) use of oral hypoglycemic drugs: Secondary | ICD-10-CM | POA: Diagnosis not present

## 2019-09-28 DIAGNOSIS — D692 Other nonthrombocytopenic purpura: Secondary | ICD-10-CM | POA: Diagnosis not present

## 2019-09-28 DIAGNOSIS — C221 Intrahepatic bile duct carcinoma: Secondary | ICD-10-CM | POA: Diagnosis not present

## 2019-09-28 DIAGNOSIS — M19049 Primary osteoarthritis, unspecified hand: Secondary | ICD-10-CM | POA: Diagnosis not present

## 2019-09-28 DIAGNOSIS — F17200 Nicotine dependence, unspecified, uncomplicated: Secondary | ICD-10-CM | POA: Diagnosis not present

## 2019-09-28 DIAGNOSIS — Z6830 Body mass index (BMI) 30.0-30.9, adult: Secondary | ICD-10-CM | POA: Diagnosis not present

## 2019-09-28 DIAGNOSIS — J449 Chronic obstructive pulmonary disease, unspecified: Secondary | ICD-10-CM | POA: Diagnosis not present

## 2019-09-28 DIAGNOSIS — Z7982 Long term (current) use of aspirin: Secondary | ICD-10-CM | POA: Diagnosis not present

## 2019-09-30 ENCOUNTER — Emergency Department (HOSPITAL_COMMUNITY): Payer: Medicare HMO

## 2019-09-30 ENCOUNTER — Other Ambulatory Visit: Payer: Self-pay

## 2019-09-30 ENCOUNTER — Emergency Department (HOSPITAL_COMMUNITY)
Admission: EM | Admit: 2019-09-30 | Discharge: 2019-10-01 | Disposition: A | Discharge status: Home / Self Care | Payer: Medicare HMO | Attending: Emergency Medicine | Admitting: Emergency Medicine

## 2019-09-30 DIAGNOSIS — I10 Essential (primary) hypertension: Secondary | ICD-10-CM | POA: Diagnosis not present

## 2019-09-30 DIAGNOSIS — Y999 Unspecified external cause status: Secondary | ICD-10-CM | POA: Diagnosis not present

## 2019-09-30 DIAGNOSIS — S098XXA Other specified injuries of head, initial encounter: Secondary | ICD-10-CM | POA: Diagnosis not present

## 2019-09-30 DIAGNOSIS — W1812XA Fall from or off toilet with subsequent striking against object, initial encounter: Secondary | ICD-10-CM | POA: Insufficient documentation

## 2019-09-30 DIAGNOSIS — Y93E8 Activity, other personal hygiene: Secondary | ICD-10-CM | POA: Insufficient documentation

## 2019-09-30 DIAGNOSIS — G319 Degenerative disease of nervous system, unspecified: Secondary | ICD-10-CM | POA: Diagnosis not present

## 2019-09-30 DIAGNOSIS — G9389 Other specified disorders of brain: Secondary | ICD-10-CM | POA: Diagnosis not present

## 2019-09-30 DIAGNOSIS — Z87891 Personal history of nicotine dependence: Secondary | ICD-10-CM | POA: Insufficient documentation

## 2019-09-30 DIAGNOSIS — S0990XA Unspecified injury of head, initial encounter: Secondary | ICD-10-CM

## 2019-09-30 DIAGNOSIS — Y92012 Bathroom of single-family (private) house as the place of occurrence of the external cause: Secondary | ICD-10-CM | POA: Insufficient documentation

## 2019-09-30 DIAGNOSIS — R52 Pain, unspecified: Secondary | ICD-10-CM | POA: Diagnosis not present

## 2019-09-30 DIAGNOSIS — S161XXA Strain of muscle, fascia and tendon at neck level, initial encounter: Secondary | ICD-10-CM

## 2019-09-30 DIAGNOSIS — R42 Dizziness and giddiness: Secondary | ICD-10-CM | POA: Diagnosis not present

## 2019-09-30 DIAGNOSIS — M542 Cervicalgia: Secondary | ICD-10-CM | POA: Diagnosis not present

## 2019-09-30 DIAGNOSIS — Z7901 Long term (current) use of anticoagulants: Secondary | ICD-10-CM | POA: Diagnosis not present

## 2019-09-30 DIAGNOSIS — Z79899 Other long term (current) drug therapy: Secondary | ICD-10-CM | POA: Insufficient documentation

## 2019-09-30 DIAGNOSIS — C228 Malignant neoplasm of liver, primary, unspecified as to type: Secondary | ICD-10-CM | POA: Diagnosis not present

## 2019-09-30 DIAGNOSIS — R55 Syncope and collapse: Secondary | ICD-10-CM | POA: Insufficient documentation

## 2019-09-30 DIAGNOSIS — S199XXA Unspecified injury of neck, initial encounter: Secondary | ICD-10-CM | POA: Diagnosis not present

## 2019-09-30 LAB — BASIC METABOLIC PANEL
Anion gap: 16 — ABNORMAL HIGH (ref 5–15)
BUN: 7 mg/dL — ABNORMAL LOW (ref 8–23)
CO2: 16 mmol/L — ABNORMAL LOW (ref 22–32)
Calcium: 9 mg/dL (ref 8.9–10.3)
Chloride: 106 mmol/L (ref 98–111)
Creatinine, Ser: 0.91 mg/dL (ref 0.44–1.00)
GFR calc Af Amer: >60 mL/min (ref >60)
GFR calc non Af Amer: >60 mL/min (ref >60)
Glucose, Bld: 124 mg/dL — ABNORMAL HIGH (ref 70–99)
Potassium: 4.9 mmol/L (ref 3.5–5.1)
Sodium: 138 mmol/L (ref 135–145)

## 2019-09-30 MED ORDER — SODIUM CHLORIDE 0.9 % IV BOLUS
500.0000 mL | Freq: Once | INTRAVENOUS | Status: AC
  Administered 2019-09-30: 500 mL via INTRAVENOUS

## 2019-09-30 NOTE — Progress Notes (Signed)
Chaplain engaged in initial visit with Bonnie Peterson and her granddaughter.  Chaplain offered support and will follow-up as needed.

## 2019-09-30 NOTE — Discharge Instructions (Signed)
You were seen in the emergency department for evaluation of injuries after a fainting spell at home.  You had a CAT scan of your head and cervical spine that showed some arthritis but no sign of fracture or bleeding.  It will be important for you to stay well-hydrated.  Call your primary care doctor for close follow-up.  Return to the emergency department if any worsening or concerning symptoms.

## 2019-09-30 NOTE — ED Triage Notes (Signed)
Pt coming by EMS after having a BM, and then passing out after standing up. Pt hit head on rail and then assisted to the floor by family. Pt having some neck pain. Pt feels weak but she is alert and oriented. On Xarelto, has c-collar placed.

## 2019-09-30 NOTE — ED Provider Notes (Signed)
[facility] EMERGENCY DEPARTMENT Provider Note   CSN: [id number] Arrival date & time: 09/30/19  2130     History No chief complaint on file.   Bonnie Peterson is a 74 y.o. female.  She has a history of liver cancer and is on blood thinners.  She was at home having a bowel movement that was hard and then when she stood up she passed out striking her head on a metal shelf.  Came to quickly after being laid on the ground.  She denies any headache neck pain chest pain abdominal pain.  She states she still feels like she is going in and out.  EMS that her blood pressures been high the entire time.  She denies any recent illness.  She said she is not on chemotherapy because it took too much out of her.  The history is provided by the patient and the EMS personnel.  Loss of Consciousness Episode history:  Single Most recent episode:  Today Progression:  Improving Chronicity:  Recurrent Context: bowel movement   Witnessed: yes   Relieved by:  Lying down Worsened by:  Nothing Ineffective treatments:  None tried Associated symptoms: no chest pain, no difficulty breathing, no fever, no focal sensory loss, no focal weakness, no headaches, no nausea, no shortness of breath and no vomiting        No past medical history on file.  There are no problems to display for this patient.   Past Surgical History:  Procedure Laterality Date  . ABDOMINAL HYSTERECTOMY    . COLONOSCOPY WITH PROPOFOL N/A 08/12/2019   Procedure: COLONOSCOPY WITH PROPOFOL;  Surgeon: [name], MD;  Location: [facility] ENDOSCOPY;  Service: Endoscopy;  Laterality: N/A;  . ESOPHAGOGASTRODUODENOSCOPY N/A 06/15/2015   Procedure: ESOPHAGOGASTRODUODENOSCOPY (EGD);  Surgeon: Gatha Mayer, MD;  Location: [facility];  Service: Endoscopy;  Laterality: N/A;  . IR IMAGING GUIDED PORT INSERTION  06/15/2019  . IR REMOVAL TUN ACCESS W/ PORT W/O FL MOD SED  11/11/2016  . LIVER BIOPSY    . OPEN PARTIAL HEPATECTOMY  Left  02/09/15  . PARTIAL HYSTERECTOMY    . ROTATOR CUFF REPAIR       L rotator cuff repair 11/07-[facility] - 12/7/200  . TONSILLECTOMY       OB History   No obstetric history on file.     Family History  Problem Relation Age of Onset  . Breast cancer Mother   . Hypertension Mother   . Coronary artery disease Mother   . Diabetes type II Mother   . Rheum arthritis Mother   . Diabetes type II Sister   . Pancreatic cancer Sister     Social History   Tobacco Use  . Smoking status: Former Smoker    Packs/day: 0.50    Years: 49.00    Pack years: 24.50    Types: Cigarettes    Quit date: 04/13/2018    Years since quitting: 1.4  . Smokeless tobacco: Never Used  Vaping Use  . Vaping Use: Never used  Substance Use Topics  . Alcohol use: Yes    Alcohol/week: 0.0 standard drinks    Comment: occasionally  . Drug use: Yes    Types: Marijuana    Home Medications Prior to Admission medications   Medication Sig Start Date End Date Taking? Authorizing Provider  albuterol (PROAIR HFA) 108 (90 Base) MCG/ACT inhaler Inhale 1-2 puffs into the lungs every 6 (six) hours as needed for wheezing or shortness of breath. 07/23/19  Parrett, Tammy S, NP  alendronate (FOSAMAX) 70 MG tablet TAKE 1 TABLET  ONCE A WEEK. Patient taking differently: Take 70 mg by mouth every Sunday. TAKE 1 TABLET  ONCE A WEEK. 03/02/19   Lattie Haw, MD  amLODipine (NORVASC) 10 MG tablet TAKE 1 TABLET EVERY DAY Patient taking differently: Take 10 mg by mouth daily.  06/03/19   Lattie Haw, MD  atenolol (TENORMIN) 25 MG tablet Take 1 tablet (25 mg total) by mouth daily. 01/08/19   Lattie Haw, MD  cholecalciferol (VITAMIN D) 1000 units tablet Take 1 tablet (1,000 Units total) by mouth daily. 01/03/17   Bufford Lope, DO  diclofenac sodium (VOLTAREN) 1 % GEL Apply 2 g topically 4 (four) times daily as needed. 01/26/18   Bufford Lope, DO  famotidine (PEPCID) 20 MG tablet Take 1 tablet (20 mg total) by mouth 2 (two) times daily.  06/26/19   Lattie Haw, MD  fish oil-omega-3 fatty acids 1000 MG capsule Take 1 g by mouth every 30 (thirty) days.     [provider]  fluticasone (FLONASE) 50 MCG/ACT nasal spray Place 2 sprays into both nostrils daily as needed for rhinitis. 04/17/16   Haney, Yetta Flock A, MD  hydrochlorothiazide (HYDRODIURIL) 25 MG tablet Take 1 tablet (25 mg total) by mouth daily. 03/02/19   Lattie Haw, MD  Hyprom-Naphaz-Polysorb-Zn Sulf (CLEAR EYES COMPLETE OP) Place 1 drop into both eyes daily as needed (dry eyes).     [provider]  lidocaine-prilocaine (EMLA) cream Apply 1 application topically every 14 (fourteen) days. Every 14 days as needed Patient taking differently: Apply 1 application topically as needed (port access).  06/17/19   Truitt Merle, MD  lisinopril (ZESTRIL) 40 MG tablet Take 1 tablet (40 mg total) by mouth daily. 03/02/19   Lattie Haw, MD  loratadine (CLARITIN) 10 MG tablet Take 1 tablet (10 mg total) by mouth daily as needed for allergies. 09/23/12   Waldemar Dickens, MD  LORazepam (ATIVAN) 0.5 MG tablet Take 0.5-1 tablets (0.25-0.5 mg total) by mouth at bedtime as needed for anxiety. Patient taking differently: Take 0.5 mg by mouth at bedtime as needed for anxiety.  07/06/19   Alla Feeling, NP  metFORMIN (GLUCOPHAGE) 500 MG tablet TAKE 2 TABLETS TWICE DAILY WITH MEALS Patient taking differently: Take 1,000 mg by mouth 2 (two) times daily with a meal. TAKE 2 TABLETS TWICE DAILY WITH MEALS 03/02/19   Lattie Haw, MD  mirtazapine (REMERON SOL-TAB) 15 MG disintegrating tablet Take 1 tablet (15 mg total) by mouth at bedtime. Patient not taking: Reported on 07/30/2019 07/16/19 07/15/20  Lattie Haw, MD  Multiple Vitamin (MULTIVITAMIN WITH MINERALS) TABS tablet Take 1 tablet by mouth daily.    [provider]  Na Sulfate-K Sulfate-Mg Sulf (SUPREP BOWEL PREP KIT) 17.5-3.13-1.6 GM/177ML SOLN Take 1 kit by mouth as directed. 07/21/19   [name], MD  ondansetron  (ZOFRAN) 4 MG tablet Take 4 mg by mouth every 8 (eight) hours as needed for nausea or vomiting.    [provider]  prochlorperazine (COMPAZINE) 10 MG tablet Take 10 mg by mouth every 6 (six) hours as needed for nausea or vomiting.    [provider]  RIVAROXABAN Alveda Reasons) VTE STARTER PACK (15 & 20 MG TABLETS) Follow package directions: Take one 75m tablet by mouth twice a day. On day 22, switch to one 216mtablet once a day. Take with food. 08/26/19   FeTruitt MerleMD  VITAMIN E PO Take 1  tablet by mouth See admin instructions. 2-3 times a month    [provider]    Allergies    Other and Propofol  Review of Systems   Review of Systems  Constitutional: Negative for fever.  HENT: Negative for sore throat.   Eyes: Negative for visual disturbance.  Respiratory: Negative for shortness of breath.   Cardiovascular: Positive for syncope. Negative for chest pain.  Gastrointestinal: Negative for abdominal pain, nausea and vomiting.  Genitourinary: Negative for dysuria.  Musculoskeletal: Negative for neck pain.  Skin: Negative for rash.  Neurological: Positive for syncope. Negative for focal weakness and headaches.    Physical Exam Updated Vital Signs BP (!) 175/69   Pulse 72   Temp 98.2 F (36.8 C) (Oral)   Resp 15   Ht 5' 2"  (1.575 m)   Wt 77.1 kg   SpO2 99%   BMI 31.09 kg/m   Physical Exam Vitals and nursing note reviewed.  Constitutional:      General: She is not in acute distress.    Appearance: She is well-developed.  HENT:     Head: Normocephalic and atraumatic.  Eyes:     Conjunctiva/sclera: Conjunctivae normal.  Neck:     Comments: Cervical collar in place trach midline no posterior neck pain. Cardiovascular:     Rate and Rhythm: Normal rate and regular rhythm.     Heart sounds: No murmur heard.   Pulmonary:     Effort: Pulmonary effort is normal. No respiratory distress.     Breath sounds: Normal breath sounds.  Abdominal:      Palpations: Abdomen is soft.     Tenderness: There is no abdominal tenderness.  Skin:    General: Skin is warm and dry.  Neurological:     General: No focal deficit present.     Mental Status: She is alert and oriented to person, place, and time.     Sensory: No sensory deficit.     Motor: No weakness.     ED Results / Procedures / Treatments   Labs (all labs ordered are listed, but only abnormal results are displayed) Labs Reviewed  BASIC METABOLIC PANEL - Abnormal; Notable for the following components:      Result Value   CO2 16 (*)    Glucose, Bld 124 (*)    BUN 7 (*)    Anion gap 16 (*)    All other components within normal limits  CBC WITH DIFFERENTIAL/PLATELET - Abnormal; Notable for the following components:   Platelets 143 (*)    All other components within normal limits  CBC WITH DIFFERENTIAL/PLATELET    EKG EKG Interpretation  Date/Time:  Thursday September 30 2019 21:38:43 EDT Ventricular Rate:  73 PR Interval:    QRS Duration: 87 QT Interval:  402 QTC Calculation: 443 R Axis:   10 Text Interpretation: Sinus rhythm Consider anterior infarct No old tracing to compare Confirmed by Aletta Edouard [phone]) on 09/30/2019 9:43:44 PM   Radiology CT Head Wo Contrast  Result Date: 09/30/2019 CLINICAL DATA:  Status post trauma. EXAM: CT HEAD WITHOUT CONTRAST TECHNIQUE: Contiguous axial images were obtained from the base of the skull through the vertex without intravenous contrast. COMPARISON:  None. FINDINGS: Brain: There is mild cerebral atrophy with widening of the extra-axial spaces and ventricular dilatation. There are areas of decreased attenuation within the white matter tracts of the supratentorial brain, consistent with microvascular disease changes. Vascular: No hyperdense vessel or unexpected calcification. Skull: Negative for fracture or focal lesion.  An enlarged, empty sella is seen. Sinuses/Orbits: No acute finding. Other: None. IMPRESSION: 1. Generalized cerebral  atrophy. 2. No acute intracranial abnormality. Electronically Signed   By: Virgina Norfolk M.D.   On: 09/30/2019 22:29   CT Cervical Spine Wo Contrast  Result Date: 09/30/2019 CLINICAL DATA:  Status post trauma. EXAM: CT CERVICAL SPINE WITHOUT CONTRAST TECHNIQUE: Multidetector CT imaging of the cervical spine was performed without intravenous contrast. Multiplanar CT image reconstructions were also generated. COMPARISON:  None. FINDINGS: Alignment: Normal. Skull base and vertebrae: No acute fracture. No primary bone lesion or focal pathologic process. Soft tissues and spinal canal: No prevertebral fluid or swelling. No visible canal hematoma. Disc levels: There is marked severity multilevel endplate sclerosis and marked severity anterior osteophyte formation seen throughout all levels of the cervical spine. Moderate severity multilevel intervertebral disc space narrowing is seen. Marked severity bilateral multilevel facet joint hypertrophy is seen. Upper chest: Negative. Other: None. IMPRESSION: 1. No acute fracture within the cervical spine. 2. Marked severity multilevel degenerative disc disease and facet joint hypertrophy. Electronically Signed   By: Virgina Norfolk M.D.   On: 09/30/2019 22:33    Procedures Procedures (including critical care time)  Medications Ordered in ED Medications  sodium chloride 0.9 % bolus 500 mL (0 mLs Intravenous Stopped 10/01/19 0206)    ED Course  I have reviewed the triage vital signs and the nursing notes.  Pertinent labs & imaging results that were available during my care of the patient were reviewed by me and considered in my medical decision making (see chart for details).    MDM Rules/Calculators/A&P                         This patient complains of syncope; this involves an extensive number of treatment Options and is a complaint that carries with it a high risk of complications and Morbidity. The differential includes vasovagal syncope,  arrhythmia, hypovolemia, metabolic derangement, infection  I ordered, reviewed and interpreted labs, which included CBC with normal white count, stable hemoglobin, mildly low platelets, chemistries with low bicarb but otherwise unremarkable likely reflecting some acidosis I ordered medication IV fluids I ordered imaging studies which included CT head and cervical spine and I independently    visualized and interpreted imaging which showed degenerative changes no acute findings Additional history obtained from patient's granddaughter Previous records obtained and reviewed in epic  After the interventions stated above, I reevaluated the patient and found patient to be hemodynamic stable.  She is asking to go home and I think this is reasonable with her current work-up.  Return instructions discussed.   Final Clinical Impression(s) / ED Diagnoses Final diagnoses:  Syncope, unspecified syncope type  Injury of head, initial encounter  Acute strain of neck muscle, initial encounter    Rx / DC Orders ED Discharge Orders    None       Hayden Rasmussen, MD 10/01/19 [phone]

## 2019-09-30 NOTE — Progress Notes (Signed)
Orthopedic Tech Progress Note Patient Details:  Bonnie Peterson 09-05-1945 [id number] Level 2 Trauma  Patient ID: Bonnie Peterson, female   DOB: 1946-01-13, 74 y.o.   MRN: [id number]   Bonnie Peterson 09/30/2019, 9:51 PM

## 2019-10-01 ENCOUNTER — Encounter (HOSPITAL_COMMUNITY): Payer: Self-pay | Admitting: Emergency Medicine

## 2019-10-01 LAB — CBC WITH DIFFERENTIAL/PLATELET
Abs Immature Granulocytes: 0.03 10*3/uL (ref 0.00–0.07)
Basophils Absolute: 0 10*3/uL (ref 0.0–0.1)
Basophils Relative: 0 %
Eosinophils Absolute: 0 10*3/uL (ref 0.0–0.5)
Eosinophils Relative: 0 %
HCT: 38.4 % (ref 36.0–46.0)
Hemoglobin: 12 g/dL (ref 12.0–15.0)
Immature Granulocytes: 0 %
Lymphocytes Relative: 16 %
Lymphs Abs: 1.3 10*3/uL (ref 0.7–4.0)
MCH: 29.4 pg (ref 26.0–34.0)
MCHC: 31.3 g/dL (ref 30.0–36.0)
MCV: 94.1 fL (ref 80.0–100.0)
Monocytes Absolute: 0.7 10*3/uL (ref 0.1–1.0)
Monocytes Relative: 8 %
Neutro Abs: 6.4 10*3/uL (ref 1.7–7.7)
Neutrophils Relative %: 76 %
Platelets: 143 10*3/uL — ABNORMAL LOW (ref 150–400)
RBC: 4.08 MIL/uL (ref 3.87–5.11)
RDW: 15 % (ref 11.5–15.5)
WBC: 8.4 10*3/uL (ref 4.0–10.5)
nRBC: 0 % (ref 0.0–0.2)

## 2019-10-05 ENCOUNTER — Telehealth: Payer: Self-pay | Admitting: *Deleted

## 2019-10-05 NOTE — Telephone Encounter (Signed)
Received fax from [facility] stating that pt may be a good candidate for statin therapy if appropriate.April Zimmerman Rumple, CMA

## 2019-10-06 NOTE — Telephone Encounter (Signed)
She will need to book an appointment with PCP to discuss  Carollee Leitz, MD [facility] Medicine [facility]

## 2019-11-09 ENCOUNTER — Telehealth: Payer: Self-pay | Admitting: Family Medicine

## 2019-11-09 NOTE — Telephone Encounter (Signed)
Called patient to see how she is doing as she has metastatic cholangiocarcinoma and has not been seen since Feb. She has also been admitted to the ED since then for a fall. Left VM explaining to book clinic visit to check in with me.

## 2019-11-24 ENCOUNTER — Inpatient Hospital Stay: Payer: Medicare HMO | Attending: Physician Assistant | Admitting: Hematology

## 2019-11-24 ENCOUNTER — Other Ambulatory Visit: Payer: Self-pay

## 2019-11-24 ENCOUNTER — Inpatient Hospital Stay: Payer: Medicare HMO

## 2019-11-24 ENCOUNTER — Encounter: Payer: Self-pay | Admitting: Hematology

## 2019-11-24 VITALS — BP 144/62 | HR 58 | Temp 97.5°F | Resp 17 | Ht 62.0 in | Wt 155.5 lb

## 2019-11-24 DIAGNOSIS — C221 Intrahepatic bile duct carcinoma: Secondary | ICD-10-CM

## 2019-11-24 DIAGNOSIS — E119 Type 2 diabetes mellitus without complications: Secondary | ICD-10-CM | POA: Diagnosis not present

## 2019-11-24 DIAGNOSIS — K219 Gastro-esophageal reflux disease without esophagitis: Secondary | ICD-10-CM | POA: Insufficient documentation

## 2019-11-24 DIAGNOSIS — I10 Essential (primary) hypertension: Secondary | ICD-10-CM | POA: Insufficient documentation

## 2019-11-24 DIAGNOSIS — R634 Abnormal weight loss: Secondary | ICD-10-CM | POA: Diagnosis not present

## 2019-11-24 DIAGNOSIS — I81 Portal vein thrombosis: Secondary | ICD-10-CM | POA: Insufficient documentation

## 2019-11-24 DIAGNOSIS — R109 Unspecified abdominal pain: Secondary | ICD-10-CM | POA: Insufficient documentation

## 2019-11-24 DIAGNOSIS — Z90710 Acquired absence of both cervix and uterus: Secondary | ICD-10-CM | POA: Insufficient documentation

## 2019-11-24 DIAGNOSIS — D696 Thrombocytopenia, unspecified: Secondary | ICD-10-CM | POA: Diagnosis not present

## 2019-11-24 DIAGNOSIS — Z7901 Long term (current) use of anticoagulants: Secondary | ICD-10-CM | POA: Insufficient documentation

## 2019-11-24 DIAGNOSIS — Z95828 Presence of other vascular implants and grafts: Secondary | ICD-10-CM

## 2019-11-24 DIAGNOSIS — R630 Anorexia: Secondary | ICD-10-CM | POA: Insufficient documentation

## 2019-11-24 LAB — CBC WITH DIFFERENTIAL (CANCER CENTER ONLY)
Abs Immature Granulocytes: 0.01 10*3/uL (ref 0.00–0.07)
Basophils Absolute: 0 10*3/uL (ref 0.0–0.1)
Basophils Relative: 1 %
Eosinophils Absolute: 0.1 10*3/uL (ref 0.0–0.5)
Eosinophils Relative: 1 %
HCT: 38.4 % (ref 36.0–46.0)
Hemoglobin: 12.2 g/dL (ref 12.0–15.0)
Immature Granulocytes: 0 %
Lymphocytes Relative: 23 %
Lymphs Abs: 1.5 10*3/uL (ref 0.7–4.0)
MCH: 28.6 pg (ref 26.0–34.0)
MCHC: 31.8 g/dL (ref 30.0–36.0)
MCV: 90.1 fL (ref 80.0–100.0)
Monocytes Absolute: 0.6 10*3/uL (ref 0.1–1.0)
Monocytes Relative: 9 %
Neutro Abs: 4.3 10*3/uL (ref 1.7–7.7)
Neutrophils Relative %: 66 %
Platelet Count: 132 10*3/uL — ABNORMAL LOW (ref 150–400)
RBC: 4.26 MIL/uL (ref 3.87–5.11)
RDW: 14.3 % (ref 11.5–15.5)
WBC Count: 6.5 10*3/uL (ref 4.0–10.5)
nRBC: 0 % (ref 0.0–0.2)

## 2019-11-24 LAB — CMP (CANCER CENTER ONLY)
ALT: 15 U/L (ref 0–44)
AST: 30 U/L (ref 15–41)
Albumin: 3.1 g/dL — ABNORMAL LOW (ref 3.5–5.0)
Alkaline Phosphatase: 97 U/L (ref 38–126)
Anion gap: 8 (ref 5–15)
BUN: 10 mg/dL (ref 8–23)
CO2: 25 mmol/L (ref 22–32)
Calcium: 9.8 mg/dL (ref 8.9–10.3)
Chloride: 106 mmol/L (ref 98–111)
Creatinine: 0.75 mg/dL (ref 0.44–1.00)
GFR, Est AFR Am: >60 mL/min (ref >60)
GFR, Estimated: >60 mL/min (ref >60)
Glucose, Bld: 138 mg/dL — ABNORMAL HIGH (ref 70–99)
Potassium: 3.7 mmol/L (ref 3.5–5.1)
Sodium: 139 mmol/L (ref 135–145)
Total Bilirubin: 0.5 mg/dL (ref 0.3–1.2)
Total Protein: 7 g/dL (ref 6.5–8.1)

## 2019-11-24 MED ORDER — SODIUM CHLORIDE 0.9% FLUSH
10.0000 mL | INTRAVENOUS | Status: DC | PRN
  Administered 2019-11-24: 10 mL
  Filled 2019-11-24: qty 10

## 2019-11-24 MED ORDER — HEPARIN SOD (PORK) LOCK FLUSH 100 UNIT/ML IV SOLN
500.0000 [IU] | Freq: Once | INTRAVENOUS | Status: AC | PRN
  Administered 2019-11-24: 500 [IU]
  Filled 2019-11-24: qty 5

## 2019-11-24 NOTE — Progress Notes (Signed)
[place]   [phone] [phone]   Clinic Follow up Note   Patient Care Team: Lattie Haw, MD as PCP - General (Family Medicine) Melancon, York Ram, MD (Inactive) as Resident (Family Medicine) Jonnie Finner, RN as Oncology Nurse Navigator Jonnie Finner, RN as Oncology Nurse Navigator Truitt Merle, MD as Consulting Physician (Hematology) [name], MD as Attending Physician (Gastroenterology) Lattie Haw, MD  Date of Service:  11/24/2019  CHIEF COMPLAINT: F/u forMetastaticCholangiocarcinoma  SUMMARY OF ONCOLOGIC HISTORY: Oncology History  Intrahepatic cholangiocarcinoma ([place])  2014 Initial Diagnosis   Intrahepatic cholangiocarcinoma ([place])   2014 Initial Diagnosis   Per notes from [facility] by Dr. Mettu 1. 2014 Mass in the left side of the liver incidentally found during work up for acute pancreatitis.  The mass was thought to be a bile duct lesion and was followed up.  2. 02/2013 MRI hyperdense focus within the left hepatic lobe.  3. 02/21/13 CT scan 3.7 x 2.1 x 2.4 cm arterial enhancing lesion.  AFP and CEA were normal.  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 4. 05/18/12 Biopsy of something (surg path in Care Everywhere, but no report) 5. 07/02/13 Biopsy of something (surg path in Care Everywhere, but no report) 6. 09/02/13 MRI lesion in segment 4A with adjacent perfusional enhancement with some washout. Lesion is close to left portal vein. 7. 09/15/14 MRI segment 4A lesion slightly increased in size, minimal fat containing, peripheral enhancement and arterial enhancement. Left portal vein is clearly not involved.   02/19/2015 Surgery   Per notes from [facility] by Dr. Dennison Nancy L lobe of liver partial hepatectomy segments 2, 3, 4a, 4b invasive cholangiocarcinoma, moderately differentiated. Tumor focally extends to the parenchymal margin of resection. Uninvolved liver parenchyma with steatohepatitis and  associated periportal and centrilobular pericellular fibrosis. Scattered von Meyenburg complexes. One hepatic artery LN with metastatic cholangiocarcinoma. Fibroadipose tissue and nerve negative for malignancy. Gallbladder negative for malignancy. One LN negative for malignancy. T2aN1 invasive cholangiocarcinoma, moderately differentiated, with positive hepatic parenchymal margin, and 1/2 nodes positive for tumor involvement. -Post operative course was complicated by fevers felt to be related to atelectasis   04/17/2015 Concurrent Chemotherapy   Per notes from [place] by Dr. Dennison Nancy -04/17/15-05/26/15 Chemoradiation with capecitabine (locally) 50.4 Gy in 28 fractions.  -Patient started getting sick, hospitalized, fluids twice, was on ondansetron, and propranolol, and sucralfate  -EGD found ulcers in stomach  -06/14/15 CTA (per Dr. Shayne Alken note) no evidence of disease recurrence.  Formation of the stomach and duodenum was seen.   08/2015 - 2017 Chemotherapy   08/2015 4 cycles of gemcitabine.  Patient intolerant to this treatment with weight loss, anorexia, and fatigue.    10/16/2015 Surgery   Per notes from [facility] by Dr. Dennison Nancy -CTA chest abdomen and pelvis status post left hepatectomy with no evidence of recurrent disease or metastatic disease.  Right lower lobe pulmonary nodules unchanged.  CA 19-9 7   08/11/2017 Pathology Results   Per notes from [place] by Dr. Dennison Nancy -07/29/17 CT abd -U/S biopsy of liver    08/27/2017 Imaging   Per note from [facility] by Dr. Dennison Nancy --08/25/17 CT chest, abdomen, and pelvis ill-defined hypoenhancing lesion in the inferior right liver concerning for m metastatic disease. Portocaval node measuring up to 1.2 cm in short axis   08/28/2017 Pathology Results   Per note from [facility] by Dr. Dennison Nancy -Invasive moderate Truman Hayward differentiated adenocarcinoma morphologically compatible with cholangio- -09/02/17 [facility] consensus appears consistent  with intrahepatic cholangiocarcinoma and surgical resection  not appropriate.  If after systemic therapy she remains stable, no extrahepatic disease, could consider HAI pump in future.  Ablation not an option.   09/24/2017 - 10/15/2017 Radiation Therapy   Per note from [facility] by Dr. Dennison Nancy -XRT x3 weeks   12/30/2017 Imaging   Per note from [facility] by Dr. Dennison Nancy CT chest, abdomen, and pelvis slight interval increase in size of metastatic lesion in the liver with increasing necrosis.  Stable right lower lobe pulmonary nodule and portacaval lymph nodes   10/14/2018 Imaging   Per note from [facility] by Dr. Dennison Nancy CT chest, abdomen, and pelvis decrease size of liver lesions, PVT new, 1 to centimeter lymph node   05/14/2019 Imaging   Per note from [facility] by Dr. Dennison Nancy -CT infiltrative mass within the porta hepatis encasing the portal triads measuring up to 4.1 cm, concerning for residual/recurrent cholangiocarcinoma.  There is worsening thrombosis/tumor in the vein involving the entire portal venous system now extending into the right portal vein and distal aspect of the splenic vein.  There are at least 6 new liver lesions concerning for multifocal hepatic metastases.  Stable retroperitoneal lymphadenopathy.  Similar appearing distal gastric wall thickening, likely post radiation changes.  Stable subcentimeter pulmonary nodules, the largest within the right lower lobe measuring 0.8 cm.  Recommend continued attention to follow-up.   05/14/2019 Cancer Staging   Staging form: Intrahepatic Bile Duct, AJCC 8th Edition - Clinical stage from 05/14/2019: Stage IV (cTX, cNX, cM1) - Signed by Truitt Merle, MD on 06/20/2019   06/21/2019 - 08/02/2019 Chemotherapy   First-line 5-fluorouracil and leucovorin IV every 2 weeks starting 06/21/19. Chemo break since 08/02/19 due to poor tolerance. She declined restarting    08/12/2019 Procedure   Colonosocpy by Dr [name]  - There were several lipomas spread throughout the colon. The largest was in the sigmoid (pedunculated 2-3cm, slightly inflamed at the  tip). This was unchanged from 2015, the previous submucosal tattoo was still evident. - Internal hemorrhoids (this is likely the cause of your minor rectal bleeding). - Diverticulosis in the left colon. - The examination was otherwise normal on direct and retroflexion views. - No polyps or cancers.   08/17/2019 Imaging   CT AP W contrast  IMPRESSION: 1. Multifocal hepatic malignancy with multiple new enhancing lesions in the RIGHT hepatic lobe compared to CT 07/29/2017 (interim CT not available). 2. Post LEFT hepatectomy. 3. Poorly defined tissue planes in the porta hepatis concerning for tumor infiltration of the porta hepatis. 4. 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 5. Probable periportal adenopathy but difficult to define tissue planes 6. No disease outside the liver/porta hepatis identified.        CURRENT THERAPY:  Observation   INTERVAL HISTORY:  Bonnie Peterson is here for a follow up. She presents to the clinic alone. She notes she is doing well. She notes in October 08, 2019 she had passed out in the bathroom and woke up on the floor. She did vomit prior to that. She is not sure what the cause was. She denies dizziness. She was told by ED this was from straining a BM and dehydration. She was given IV Fluids.  She notes occasional mid abdominal pain based on what she eats. When she hs right abdominal pain she uses cold pack. This is manageable. She takes Pepcid twice a day. She does continues to  lose weight. She notes her energy level is adequate to take care of herself, but decreased. She is often up and down during the day. She feels she can live by herself. She notes she will get medical alert necklace.     REVIEW OF SYSTEMS:   Constitutional: Denies fevers, chills (+) Fair energy (+) Low appetite, weight loss Eyes: Denies blurriness of vision Ears, nose, mouth,  throat, and face: Denies mucositis or sore throat Respiratory: Denies cough, dyspnea or wheezes Cardiovascular: Denies palpitation, chest discomfort or lower extremity swelling Gastrointestinal:  Denies nausea or change in bowel habits (+) Occasional abdominal pain. (+) Acid reflux Skin: Denies abnormal skin rashes Lymphatics: Denies new lymphadenopathy or easy bruising Neurological:Denies numbness, tingling or new weaknesses Behavioral/Psych: Mood is stable, no new changes  All other systems were reviewed with the patient and are negative.  MEDICAL HISTORY:  Past Medical History:  Diagnosis Date  . Anxiety   . Benign positional vertigo   . Cancer ([place]) 02/09/15   intrahepatic cholangiocarcinoma  . COPD (chronic obstructive pulmonary disease) ([place])   . Depression   . Diabetes mellitus without complication ([place])   . Early cataracts, bilateral 10/13   Optho, Dr Gershon Crane  . Echocardiogram findings abnormal, without diagnosis 10/10   10/10: mild pulm HTN, EF 60-65%, mild LVH, moderate aortic regurg  . GERD (gastroesophageal reflux disease)    I have "acid reflux"  . Gout   . HLD (hyperlipidemia)   . HTN, goal below 130/80   . Irritable bowel syndrome   . Obesity, Class III, BMI 40-49.9 (morbid obesity) ([place])   . Obstructive sleep apnea    wears cpap  . Osteoarthritis (arthritis due to wear and tear of joints)    also gout  . Restrictive lung disease     SURGICAL HISTORY: Past Surgical History:  Procedure Laterality Date  . ABDOMINAL HYSTERECTOMY    . COLONOSCOPY WITH PROPOFOL N/A 08/12/2019   Procedure: COLONOSCOPY WITH PROPOFOL;  Surgeon: [name], MD;  Location: [facility];  Service: Endoscopy;  Laterality: N/A;  . ESOPHAGOGASTRODUODENOSCOPY N/A 06/15/2015   Procedure: ESOPHAGOGASTRODUODENOSCOPY (EGD);  Surgeon: Gatha Mayer, MD;  Location: [facility];  Service: Endoscopy;  Laterality: N/A;  . IR IMAGING GUIDED PORT INSERTION  06/15/2019  . IR REMOVAL TUN ACCESS W/  PORT W/O FL MOD SED  11/11/2016  . LIVER BIOPSY    . OPEN PARTIAL HEPATECTOMY  Left 02/09/15  . PARTIAL HYSTERECTOMY    . ROTATOR CUFF REPAIR       L rotator cuff repair 11/07-[place] - 12/7/200  . TONSILLECTOMY      I have reviewed the social history and family history with the patient and they are unchanged from previous note.  ALLERGIES:  is allergic to other and propofol.  MEDICATIONS:  Current Outpatient Medications  Medication Sig Dispense Refill  . albuterol (PROAIR HFA) 108 (90 Base) MCG/ACT inhaler Inhale 1-2 puffs into the lungs every 6 (six) hours as needed for wheezing or shortness of breath. 1 g 3  . alendronate (FOSAMAX) 70 MG tablet TAKE 1 TABLET  ONCE A WEEK. (Patient taking differently: Take 70 mg by mouth every Sunday. TAKE 1 TABLET  ONCE A WEEK.) 12 tablet 2  . amLODipine (NORVASC) 10 MG tablet TAKE 1 TABLET EVERY DAY (Patient taking differently: Take 10 mg by mouth daily. ) 90 tablet 1  . atenolol (TENORMIN) 25 MG tablet Take 1 tablet (25 mg total) by mouth daily. 90 tablet 3  . cholecalciferol (  VITAMIN D) 1000 units tablet Take 1 tablet (1,000 Units total) by mouth daily. 30 tablet 11  . diclofenac sodium (VOLTAREN) 1 % GEL Apply 2 g topically 4 (four) times daily as needed. 100 g 4  . famotidine (PEPCID) 20 MG tablet Take 1 tablet (20 mg total) by mouth 2 (two) times daily. 180 tablet 3  . fish oil-omega-3 fatty acids 1000 MG capsule Take 1 g by mouth every 30 (thirty) days.     . fluticasone (FLONASE) 50 MCG/ACT nasal spray Place 2 sprays into both nostrils daily as needed for rhinitis. 16 g 2  . hydrochlorothiazide (HYDRODIURIL) 25 MG tablet Take 1 tablet (25 mg total) by mouth daily. 90 tablet 3  . Hyprom-Naphaz-Polysorb-Zn Sulf (CLEAR EYES COMPLETE OP) Place 1 drop into both eyes daily as needed (dry eyes).     [name] lidocaine-prilocaine (EMLA) cream Apply 1 application topically every 14 (fourteen) days. Every 14 days as needed (Patient taking differently: Apply 1  application topically as needed (port access). ) 30 g 2  . lisinopril (ZESTRIL) 40 MG tablet Take 1 tablet (40 mg total) by mouth daily. 90 tablet 3  . loratadine (CLARITIN) 10 MG tablet Take 1 tablet (10 mg total) by mouth daily as needed for allergies. 30 tablet 3  . LORazepam (ATIVAN) 0.5 MG tablet Take 0.5-1 tablets (0.25-0.5 mg total) by mouth at bedtime as needed for anxiety. (Patient taking differently: Take 0.5 mg by mouth at bedtime as needed for anxiety. ) 20 tablet 0  . metFORMIN (GLUCOPHAGE) 500 MG tablet TAKE 2 TABLETS TWICE DAILY WITH MEALS (Patient taking differently: Take 1,000 mg by mouth 2 (two) times daily with a meal. TAKE 2 TABLETS TWICE DAILY WITH MEALS) 360 tablet 3  . mirtazapine (REMERON SOL-TAB) 15 MG disintegrating tablet Take 1 tablet (15 mg total) by mouth at bedtime. (Patient not taking: Reported on 07/30/2019) 30 tablet 2  . Multiple Vitamin (MULTIVITAMIN WITH MINERALS) TABS tablet Take 1 tablet by mouth daily.    . Na Sulfate-K Sulfate-Mg Sulf (SUPREP BOWEL PREP KIT) 17.5-3.13-1.6 GM/177ML SOLN Take 1 kit by mouth as directed. 324 mL 0  . ondansetron (ZOFRAN) 4 MG tablet Take 4 mg by mouth every 8 (eight) hours as needed for nausea or vomiting.    . prochlorperazine (COMPAZINE) 10 MG tablet Take 10 mg by mouth every 6 (six) hours as needed for nausea or vomiting.    [name] RIVAROXABAN (XARELTO) VTE STARTER PACK (15 & 20 MG TABLETS) Follow package directions: Take one 100m tablet by mouth twice a day. On day 22, switch to one 226mtablet once a day. Take with food. 51 each 0  . VITAMIN E PO Take 1 tablet by mouth See admin instructions. 2-3 times a month     No current facility-administered medications for this visit.    PHYSICAL EXAMINATION: ECOG PERFORMANCE STATUS: 2 - Symptomatic, <50% confined to bed  Vitals:   11/24/19 1310  BP: (!) 144/62  Pulse: (!) 58  Resp: 17  Temp: (!) 97.5 F (36.4 C)  SpO2: 99%   Filed Weights   11/24/19 1310  Weight: 155 lb 8 oz  (70.5 kg)    GENERAL:alert, no distress and comfortable SKIN: skin color, texture, turgor are normal, no rashes or significant lesions EYES: normal, Conjunctiva are pink and non-injected, sclera clear  NECK: supple, thyroid normal size, non-tender, without nodularity LYMPH:  no palpable lymphadenopathy in the cervical, axillary  LUNGS: clear to auscultation and percussion with normal breathing effort  HEART: regular rate & rhythm and no murmurs and no lower extremity edema ABDOMEN:abdomen soft, non-tender and normal bowel sounds (+) Surgical incisions healed well with scar tissue (+) RUQ Tenderness with mild hepatomegaly  Musculoskeletal:no cyanosis of digits and no clubbing  NEURO: alert & oriented x 3 with fluent speech, no focal motor/sensory deficits  LABORATORY DATA:  I have reviewed the data as listed CBC Latest Ref Rng & Units 11/24/2019 09/30/2019 09/23/2019  WBC 4.0 - 10.5 K/uL 6.5 8.4 6.8  Hemoglobin 12.0 - 15.0 g/dL 12.2 12.0 11.5(L)  Hematocrit 36 - 46 % 38.4 38.4 36.4  Platelets 150 - 400 K/uL 132(L) 143(L) 132(L)     CMP Latest Ref Rng & Units 11/24/2019 09/30/2019 08/26/2019  Glucose 70 - 99 mg/dL 138(H) 124(H) 183(H)  BUN 8 - 23 mg/dL 10 7(L) 10  Creatinine 0.44 - 1.00 mg/dL 0.75 0.91 0.86  Sodium 135 - 145 mmol/L 139 138 137  Potassium 3.5 - 5.1 mmol/L 3.7 4.9 4.3  Chloride 98 - 111 mmol/L 106 106 104  CO2 22 - 32 mmol/L 25 16(L) 25  Calcium 8.9 - 10.3 mg/dL 9.8 9.0 9.9  Total Protein 6.5 - 8.1 g/dL 7.0 - 7.7  Total Bilirubin 0.3 - 1.2 mg/dL 0.5 - 0.5  Alkaline Phos 38 - 126 U/L 97 - 108  AST 15 - 41 U/L 30 - 32  ALT 0 - 44 U/L 15 - 19      RADIOGRAPHIC STUDIES: I have personally reviewed the radiological images as listed and agreed with the findings in the report. No results found.   ASSESSMENT & PLAN:  Bonnie Peterson is a 74 y.o. 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 She was treated with adjuvant concurrent ChemoRT with Xeloda for about 6 weeks.  -Based on 08/2017 CT scan and Liver biopsy at [facility] she had cancer recurrence with metastasis. She was then treated with Radiation for about 3 weeks  -Her 05/14/19 CT from [facility] showsinfiltrative mass within the porta hepatis encasing the portal triads measuring up to 4.1 cm, concerning for residual/recurrent cholangiocarcinoma.She is stage IV and her cancer is no longer curable at this point but still treatable.  -Given poor tolerance to more intensive treatment in the past,Istartedher on first-line5-fluorouracil and leucovorin IV every 2 weekson3/15/21. Due to poor toleration, we stopped for chemo break on 08/02/19.Will proceed with observation.  -Her [facility] results show she would be eligible for target therapy including HER2 antibodies and IDH inhibitor. I also discussed the option of Y90 radio embolization which is targeted. I discussed these have limited benefit but still a likely option. She declined further treatment and would like to continue with observation.  -She is managing overall well off chemo. She continues to have low appetite and acid reflux with weight loss. She only has occasional abdominal pain. I reviewed management with her. She is still able to take care of herself with adequate performance status so far.  -Labs reviewed, CBC and CMP WNL except plt 132K, BG 138, albumin 3.1. Physical exam shows RUQ pain with mild hepatomegaly.  --I discussed if she does not plan to have more cancer treatment then I do not plan to repeat scan unless she develops concerning symptoms or if she changes her mind on treatment.  However patient is eager to know how  her cancer status, and would like to have skin in 2 months.  -F/u in 2 months with scan. I encouraged her to continue to watch for concerning  symptoms.    2. Symptom Management: Lower appetite, weight loss, Acid Reflux, Right abdominal pain -She notes she has not been eating well on chemo and has weight loss.  -Her appetite has not improved off chemo. She still eats 1/3 of usual meals along with early satiety and burping. Her weight continues to trending down.  -I discussed her recent fatigue is related to her weight loss. She will continue Mirtazapine and I recommend she increase Glucerna in between meals and increase protein and calorie intake.   -She notes she will have abdominal pain based on what she eats. She does have hepatomegaly on exam today from her caner which also contributes to her pain (11/24/19) -She also has acid reflux with a lot of burping. She is on Pepcid BID. She notes so far this is manageable.   3. DM, HTN -Will watch on chemo.We discussed that chemo could affect her BP and BG.I recommend she watch her BP at home.  -She is apprehensive about using BG monitor given concerns of finger numbness. -Her blood pressure and random sugar has been stable lately  4. Mild anemia and thrombocytopenia -Secondary to chemotherapy, now off chemo and treatments -Anemia has resolved. plt at 132K today (11/24/19)  5.Goal of care discussion, DNR/DNI -We previously discussed the incurable nature of her cancer, and the overall poor prognosis, especially if she does not have good response to chemotherapy or progress on chemo -The patient understands the goal of care is palliative. -I recommend DNR/DNI, she agrees  -She notes she has 3 remaining children who live in [place] who offered to move her in if she needs more help. She does not want to move in with anyone of her family members and rather have help from social services or home care service. I dicussed this may be out of pocket cost.  -She notes she does not have living will set up, I encouraged her to set up will and POA (her son) to direct her medical care.  6.  Portal Vein Thrombosis  -Seen on 08/17/19 she has thrombosisinvolving the main portal vein andright portal vein extending retrograde into the most distal aspect the splenic vein. -To manage this I restarted her on Xarelto (08/26/19).  -She did have h/o hematochezia, will monitor.   PLAN: -lab reviewed, continue observation -F/u in 2 months with lab and CT CAP w contrast a few days before   No problem-specific Assessment & Plan notes found for this encounter.   Orders Placed This Encounter  Procedures  . CT Abdomen Pelvis W Contrast    Standing Status:   Future    Standing Expiration Date:   11/23/2020    Order Specific Question:   If indicated for the ordered procedure, I authorize the administration of contrast media per Radiology protocol    Answer:   Yes    Order Specific Question:   Preferred imaging location?    Answer:   [facility]    Order Specific Question:   Release to patient    Answer:   Immediate    Order Specific Question:   Is Oral Contrast requested for this exam?    Answer:   Yes, Per Radiology protocol    Order Specific Question:   Radiology Contrast Protocol - do NOT remove file path    Answer:   \\  [email or web address]  . CT Chest W Contrast    Standing Status:   Future    Standing Expiration Date:   11/23/2020    Order Specific Question:   If indicated for the ordered procedure, I authorize the administration of contrast media per Radiology protocol    Answer:   Yes    Order Specific Question:   Preferred imaging location?    Answer:   [facility]    Order Specific Question:   Radiology Contrast Protocol - do NOT remove file path    Answer:   \\[email or web address]   All questions were answered. The patient knows to call the clinic with any problems, questions or concerns. No barriers to learning was detected. The total time spent in the appointment was 30 minutes.     Truitt Merle,  MD 11/24/2019   I, Joslyn Devon, am acting as scribe for Truitt Merle, MD.   I have reviewed the above documentation for accuracy and completeness, and I agree with the above.

## 2019-11-25 ENCOUNTER — Telehealth: Payer: Self-pay | Admitting: Hematology

## 2019-11-25 NOTE — Telephone Encounter (Signed)
Scheduled per 8/18 los. Pt requested afternoons and is aware of appt times and dates.

## 2019-12-20 ENCOUNTER — Telehealth: Payer: Self-pay

## 2019-12-20 ENCOUNTER — Other Ambulatory Visit: Payer: Self-pay | Admitting: Hematology

## 2019-12-20 MED ORDER — TRAMADOL HCL 50 MG PO TABS: 50.0000 mg | ORAL_TABLET | Freq: Four times a day (QID) | ORAL | 0 refills | Status: DC | PRN

## 2019-12-20 MED FILL — traMADol HCL 50 MG TABS: 50 | 7 days supply | Qty: 30 | Fill #0

## 2019-12-20 NOTE — Telephone Encounter (Signed)
Bonnie Peterson called stating she is having increased right sided back pain and inflammation.  She has been taking tylenol and ibuprofen without relief.  She is requesting medication for the pain. She would like the rx sent to [facility] long outpatient pharmacy. Forwarded to Dr. Burr Medico

## 2020-01-03 ENCOUNTER — Telehealth: Payer: Self-pay

## 2020-01-03 NOTE — Telephone Encounter (Signed)
Ms Hayworth daughter Bonnie Peterson left vm requesting call back.  I returned [name]'s call.   She states her mother is not eating, only drinking 1 prt supplement daily, and is c/o abd pain.  She would like her evaluated.  Appt made.  Bonnie Peterson will accompany her mother to the appt.

## 2020-01-05 ENCOUNTER — Ambulatory Visit: Payer: Medicare HMO | Admitting: Family Medicine

## 2020-01-05 NOTE — Progress Notes (Signed)
[place]   [phone] [phone]   Clinic Follow up Note   Patient Care Team: Lattie Haw, MD as PCP - General (Family Medicine) Melancon, York Ram, MD (Inactive) as Resident (Family Medicine) Jonnie Finner, RN as Oncology Nurse Navigator Jonnie Finner, RN as Oncology Nurse Navigator Truitt Merle, MD as Consulting Physician (Hematology) [name], MD as Attending Physician (Gastroenterology) Lattie Haw, MD  Date of Service:  01/06/2020  CHIEF COMPLAINT: F/u forMetastaticCholangiocarcinoma  SUMMARY OF ONCOLOGIC HISTORY: Oncology History  Intrahepatic cholangiocarcinoma ([place])  2014 Initial Diagnosis   Intrahepatic cholangiocarcinoma ([place])   2014 Initial Diagnosis   Per notes from [facility] by Dr. Mettu 1. 2014 Mass in the left side of the liver incidentally found during work up for acute pancreatitis.  The mass was thought to be a bile duct lesion and was followed up.  2. 02/2013 MRI hyperdense focus within the left hepatic lobe.  3. 02/21/13 CT scan 3.7 x 2.1 x 2.4 cm arterial enhancing lesion.  AFP and CEA were normal.  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 4. 05/18/12 Biopsy of something (surg path in Care Everywhere, but no report) 5. 07/02/13 Biopsy of something (surg path in Care Everywhere, but no report) 6. 09/02/13 MRI lesion in segment 4A with adjacent perfusional enhancement with some washout. Lesion is close to left portal vein. 7. 09/15/14 MRI segment 4A lesion slightly increased in size, minimal fat containing, peripheral enhancement and arterial enhancement. Left portal vein is clearly not involved.   02/19/2015 Surgery   Per notes from [facility] by Dr. Dennison Nancy L lobe of liver partial hepatectomy segments 2, 3, 4a, 4b invasive cholangiocarcinoma, moderately differentiated. Tumor focally extends to the parenchymal margin of resection. Uninvolved liver parenchyma with steatohepatitis and  associated periportal and centrilobular pericellular fibrosis. Scattered von Meyenburg complexes. One hepatic artery LN with metastatic cholangiocarcinoma. Fibroadipose tissue and nerve negative for malignancy. Gallbladder negative for malignancy. One LN negative for malignancy. T2aN1 invasive cholangiocarcinoma, moderately differentiated, with positive hepatic parenchymal margin, and 1/2 nodes positive for tumor involvement. -Post operative course was complicated by fevers felt to be related to atelectasis   04/17/2015 Concurrent Chemotherapy   Per notes from [place] by Dr. Dennison Nancy -04/17/15-05/26/15 Chemoradiation with capecitabine (locally) 50.4 Gy in 28 fractions.  -Patient started getting sick, hospitalized, fluids twice, was on ondansetron, and propranolol, and sucralfate  -EGD found ulcers in stomach  -06/14/15 CTA (per Dr. Shayne Alken note) no evidence of disease recurrence.  Formation of the stomach and duodenum was seen.   08/2015 - 2017 Chemotherapy   08/2015 4 cycles of gemcitabine.  Patient intolerant to this treatment with weight loss, anorexia, and fatigue.    10/16/2015 Surgery   Per notes from [facility] by Dr. Dennison Nancy -CTA chest abdomen and pelvis status post left hepatectomy with no evidence of recurrent disease or metastatic disease.  Right lower lobe pulmonary nodules unchanged.  CA 19-9 7   08/11/2017 Pathology Results   Per notes from [place] [facility] by Dr. Dennison Nancy -07/29/17 CT abd -U/S biopsy of liver    08/27/2017 Imaging   Per note from [facility] by Dr. Dennison Nancy --08/25/17 CT chest, abdomen, and pelvis ill-defined hypoenhancing lesion in the inferior right liver concerning for m metastatic disease. Portocaval node measuring up to 1.2 cm in short axis   08/28/2017 Pathology Results   Per note from [facility] by Dr. Dennison Nancy -Invasive moderate Truman Hayward differentiated adenocarcinoma morphologically compatible with cholangio- -09/02/17 [facility] consensus appears consistent  with intrahepatic cholangiocarcinoma and surgical resection  not appropriate.  If after systemic therapy she remains stable, no extrahepatic disease, could consider HAI pump in future.  Ablation not an option.   09/24/2017 - 10/15/2017 Radiation Therapy   Per note from [facility] by Dr. Dennison Nancy -XRT x3 weeks   12/30/2017 Imaging   Per note from [facility] by Dr. Dennison Nancy CT chest, abdomen, and pelvis slight interval increase in size of metastatic lesion in the liver with increasing necrosis.  Stable right lower lobe pulmonary nodule and portacaval lymph nodes   10/14/2018 Imaging   Per note from [facility] by Dr. Dennison Nancy CT chest, abdomen, and pelvis decrease size of liver lesions, PVT new, 1 to centimeter lymph node   05/14/2019 Imaging   Per note from [facility] by Dr. Dennison Nancy -CT infiltrative mass within the porta hepatis encasing the portal triads measuring up to 4.1 cm, concerning for residual/recurrent cholangiocarcinoma.  There is worsening thrombosis/tumor in the vein involving the entire portal venous system now extending into the right portal vein and distal aspect of the splenic vein.  There are at least 6 new liver lesions concerning for multifocal hepatic metastases.  Stable retroperitoneal lymphadenopathy.  Similar appearing distal gastric wall thickening, likely post radiation changes.  Stable subcentimeter pulmonary nodules, the largest within the right lower lobe measuring 0.8 cm.  Recommend continued attention to follow-up.   05/14/2019 Cancer Staging   Staging form: Intrahepatic Bile Duct, AJCC 8th Edition - Clinical stage from 05/14/2019: Stage IV (cTX, cNX, cM1) - Signed by Truitt Merle, MD on 06/20/2019   06/21/2019 - 08/02/2019 Chemotherapy   First-line 5-fluorouracil and leucovorin IV every 2 weeks starting 06/21/19. Chemo break since 08/02/19 due to poor tolerance. She declined restarting    08/12/2019 Procedure   Colonosocpy by Dr [name]  - There were several lipomas spread throughout the colon. The largest was in the sigmoid (pedunculated 2-3cm, slightly inflamed at the  tip). This was unchanged from 2015, the previous submucosal tattoo was still evident. - Internal hemorrhoids (this is likely the cause of your minor rectal bleeding). - Diverticulosis in the left colon. - The examination was otherwise normal on direct and retroflexion views. - No polyps or cancers.   08/17/2019 Imaging   CT AP W contrast  IMPRESSION: 1. Multifocal hepatic malignancy with multiple new enhancing lesions in the RIGHT hepatic lobe compared to CT 07/29/2017 (interim CT not available). 2. Post LEFT hepatectomy. 3. Poorly defined tissue planes in the porta hepatis concerning for tumor infiltration of the porta hepatis. 4. 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 5. Probable periportal adenopathy but difficult to define tissue planes 6. No disease outside the liver/porta hepatis identified.        CURRENT THERAPY:  Observation  INTERVAL HISTORY:  Bonnie Peterson is here for a follow up of [place]. She presents to the clinic with her daughter.  Patient reports worsening right upper abdominal pain, for the past few months.  She lost about 10 pounds in the past 6 weeks.  She has very little appetite, early satiety, eats small meals.  She did try booster but did not like it.  No fever, chills.  Bowel movements normal.  She still able to function at home, her daughter is very concerned about   All other systems were reviewed with the patient and are negative.  MEDICAL HISTORY:  Past Medical History:  Diagnosis Date  .  Anxiety   . Benign positional vertigo   . Cancer ([place]) 02/09/15   intrahepatic cholangiocarcinoma  . COPD (chronic obstructive pulmonary disease) ([place])   . Depression   . Diabetes mellitus without complication ([place])   . Early cataracts, bilateral 10/13   Optho, Dr Gershon Crane  . Echocardiogram findings abnormal, without diagnosis  10/10   10/10: mild pulm HTN, EF 60-65%, mild LVH, moderate aortic regurg  . GERD (gastroesophageal reflux disease)    I have "acid reflux"  . Gout   . HLD (hyperlipidemia)   . HTN, goal below 130/80   . Irritable bowel syndrome   . Obesity, Class III, BMI 40-49.9 (morbid obesity) ([place])   . Obstructive sleep apnea    wears cpap  . Osteoarthritis (arthritis due to wear and tear of joints)    also gout  . Restrictive lung disease     SURGICAL HISTORY: Past Surgical History:  Procedure Laterality Date  . ABDOMINAL HYSTERECTOMY    . COLONOSCOPY WITH PROPOFOL N/A 08/12/2019   Procedure: COLONOSCOPY WITH PROPOFOL;  Surgeon: [name], MD;  Location: [facility] ENDOSCOPY;  Service: Endoscopy;  Laterality: N/A;  . ESOPHAGOGASTRODUODENOSCOPY N/A 06/15/2015   Procedure: ESOPHAGOGASTRODUODENOSCOPY (EGD);  Surgeon: Gatha Mayer, MD;  Location: [facility];  Service: Endoscopy;  Laterality: N/A;  . IR IMAGING GUIDED PORT INSERTION  06/15/2019  . IR REMOVAL TUN ACCESS W/ PORT W/O FL MOD SED  11/11/2016  . LIVER BIOPSY    . OPEN PARTIAL HEPATECTOMY  Left 02/09/15  . PARTIAL HYSTERECTOMY    . ROTATOR CUFF REPAIR       L rotator cuff repair 11/07-[place] - 12/7/200  . TONSILLECTOMY      I have reviewed the social history and family history with the patient and they are unchanged from previous note.  ALLERGIES:  is allergic to other and propofol.  MEDICATIONS:  Current Outpatient Medications  Medication Sig Dispense Refill  . albuterol (PROAIR HFA) 108 (90 Base) MCG/ACT inhaler Inhale 1-2 puffs into the lungs every 6 (six) hours as needed for wheezing or shortness of breath. 1 g 3  . alendronate (FOSAMAX) 70 MG tablet TAKE 1 TABLET  ONCE A WEEK. (Patient taking differently: Take 70 mg by mouth every Sunday. TAKE 1 TABLET  ONCE A WEEK.) 12 tablet 2  . amLODipine (NORVASC) 10 MG tablet TAKE 1 TABLET EVERY DAY (Patient taking differently: Take 10 mg by mouth daily. ) 90 tablet 1  . atenolol (TENORMIN)  25 MG tablet Take 1 tablet (25 mg total) by mouth daily. 90 tablet 3  . cholecalciferol (VITAMIN D) 1000 units tablet Take 1 tablet (1,000 Units total) by mouth daily. 30 tablet 11  . diclofenac sodium (VOLTAREN) 1 % GEL Apply 2 g topically 4 (four) times daily as needed. 100 g 4  . famotidine (PEPCID) 20 MG tablet Take 1 tablet (20 mg total) by mouth 2 (two) times daily. 180 tablet 3  . fish oil-omega-3 fatty acids 1000 MG capsule Take 1 g by mouth every 30 (thirty) days.     . fluticasone (FLONASE) 50 MCG/ACT nasal spray Place 2 sprays into both nostrils daily as needed for rhinitis. 16 g 2  . hydrochlorothiazide (HYDRODIURIL) 25 MG tablet Take 1 tablet (25 mg total) by mouth daily. 90 tablet 3  . Hyprom-Naphaz-Polysorb-Zn Sulf (CLEAR EYES COMPLETE OP) Place 1 drop into both eyes daily as needed (dry eyes).     [name] lidocaine-prilocaine (EMLA) cream Apply 1 application topically every 14 (fourteen) days.  Every 14 days as needed (Patient taking differently: Apply 1 application topically as needed (port access). ) 30 g 2  . lisinopril (ZESTRIL) 40 MG tablet Take 1 tablet (40 mg total) by mouth daily. 90 tablet 3  . loratadine (CLARITIN) 10 MG tablet Take 1 tablet (10 mg total) by mouth daily as needed for allergies. 30 tablet 3  . LORazepam (ATIVAN) 0.5 MG tablet Take 0.5-1 tablets (0.25-0.5 mg total) by mouth at bedtime as needed for anxiety. (Patient taking differently: Take 0.5 mg by mouth at bedtime as needed for anxiety. ) 20 tablet 0  . metFORMIN (GLUCOPHAGE) 500 MG tablet TAKE 2 TABLETS TWICE DAILY WITH MEALS (Patient taking differently: Take 1,000 mg by mouth 2 (two) times daily with a meal. TAKE 2 TABLETS TWICE DAILY WITH MEALS) 360 tablet 3  . Na Sulfate-K Sulfate-Mg Sulf (SUPREP BOWEL PREP KIT) 17.5-3.13-1.6 GM/177ML SOLN Take 1 kit by mouth as directed. 324 mL 0  . ondansetron (ZOFRAN) 4 MG tablet Take 4 mg by mouth every 8 (eight) hours as needed for nausea or vomiting.    . prochlorperazine  (COMPAZINE) 10 MG tablet Take 10 mg by mouth every 6 (six) hours as needed for nausea or vomiting.    [name] RIVAROXABAN (XARELTO) VTE STARTER PACK (15 & 20 MG TABLETS) Follow package directions: Take one 37m tablet by mouth twice a day. On day 22, switch to one 28mtablet once a day. Take with food. 51 each 0  . traMADol (ULTRAM) 50 MG tablet Take 1 tablet (50 mg total) by mouth every 6 (six) hours as needed. 30 tablet 0  . VITAMIN E PO Take 1 tablet by mouth See admin instructions. 2-3 times a month    . mirtazapine (REMERON SOL-TAB) 15 MG disintegrating tablet Take 1 tablet (15 mg total) by mouth at bedtime. 30 tablet 2  . Multiple Vitamin (MULTIVITAMIN WITH MINERALS) TABS tablet Take 1 tablet by mouth daily.    . Marland KitchenxyCODONE (OXY IR/ROXICODONE) 5 MG immediate release tablet Take 0.5-1 tablets (2.5-5 mg total) by mouth every 8 (eight) hours as needed for severe pain. 15 tablet 0   No current facility-administered medications for this visit.    PHYSICAL EXAMINATION: ECOG PERFORMANCE STATUS: 2 - Symptomatic, <50% confined to bed  Vitals:   01/06/20 1339  BP: (!) 190/64  Pulse: 60  Resp: 18  Temp: 98.2 F (36.8 C)  SpO2: 100%   Filed Weights   01/06/20 1339  Weight: 145 lb 12.8 oz (66.1 kg)    GENERAL:alert, no distress and comfortable SKIN: skin color, texture, turgor are normal, no rashes or significant lesions EYES: normal, Conjunctiva are pink and non-injected, sclera clear NECK: supple, thyroid normal size, non-tender, without nodularity LYMPH:  no palpable lymphadenopathy in the cervical, axillary  LUNGS: clear to auscultation and percussion with normal breathing effort HEART: regular rate & rhythm and no murmurs and no lower extremity edema ABDOMEN:abdomen soft, (+) hepatomegaly with tenderness, a few palpable subcutaneous nodules in epigastric and RUQ along the liver edge.  Musculoskeletal:no cyanosis of digits and no clubbing  NEURO: alert & oriented x 3 with fluent speech,  no focal motor/sensory deficits  LABORATORY DATA:  I have reviewed the data as listed CBC Latest Ref Rng & Units 01/06/2020 11/24/2019 09/30/2019  WBC 4.0 - 10.5 K/uL 5.0 6.5 8.4  Hemoglobin 12.0 - 15.0 g/dL 12.3 12.2 12.0  Hematocrit 36 - 46 % 37.9 38.4 38.4  Platelets 150 - 400 K/uL 107(L) 132(L) 143(L)  CMP Latest Ref Rng & Units 01/06/2020 11/24/2019 09/30/2019  Glucose 70 - 99 mg/dL 108(H) 138(H) 124(H)  BUN 8 - 23 mg/dL 7(L) 10 7(L)  Creatinine 0.44 - 1.00 mg/dL 0.65 0.75 0.91  Sodium 135 - 145 mmol/L 138 139 138  Potassium 3.5 - 5.1 mmol/L 3.3(L) 3.7 4.9  Chloride 98 - 111 mmol/L 100 106 106  CO2 22 - 32 mmol/L 29 25 16(L)  Calcium 8.9 - 10.3 mg/dL 9.0 9.8 9.0  Total Protein 6.5 - 8.1 g/dL 7.0 7.0 -  Total Bilirubin 0.3 - 1.2 mg/dL 0.7 0.5 -  Alkaline Phos 38 - 126 U/L 109 97 -  AST 15 - 41 U/L 39 30 -  ALT 0 - 44 U/L 22 15 -      RADIOGRAPHIC STUDIES: I have personally reviewed the radiological images as listed and agreed with the findings in the report. No results found.   ASSESSMENT & PLAN:  Bonnie Peterson is a 74 y.o. 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 She was treated with adjuvant concurrent ChemoRT with Xeloda for about 6 weeks.  -Based on 08/2017 CT scan and Liver biopsy at [facility] she had cancer recurrence with metastasis. She was then treated with Radiation for about 3 weeks  -Her 05/14/19 CT from [facility] showsinfiltrative mass within the porta hepatis encasing the portal triads measuring up to 4.1 cm, concerning for residual/recurrent cholangiocarcinoma.She is stage IV and her cancer is no longer curable at this point but still treatable.  -Given poor tolerance to more intensive treatment in the past,Istartedher on first-line5-fluorouracil and leucovorin IV  every 2 weekson3/15/21. Due to poor toleration, we stopped for chemo break on 08/02/19.Current on -Her [facility] results show HER amplification (equivocal ), and IDH 2 mutation, she maybe be eligible for targettherapy including HER2 antibodies and IDH inhibitor. -She now has developed more symptoms, including abdominal pain, anorexia, weight loss, likely related to the progression. I will get a CT scan next week for further evaluation. -We discussed treatment options, she is still very reluctant to take chemotherapy, but may be open to targeted therapy if it is available. -Follow-up after CT scan.   2. Symptom Management: Lower appetite, weight loss, Acid Reflux, Right abdominal pain -She notes she has not been eating well on chemo and has weight loss. -Her appetite has not improved off chemo. She still eats 1/3 of usual meals along with earlysatietyand burping. Her weight continues to trending down.  -I encouraged her to consider nutritional supplement, and watch her weight -She has tramadol, but does not control her pain well. I will call you oxycodone for her today. She can start half tablets 2.5 mg. We discussed side effects especially constipation.   3. DM, HTN -Continue medications and f/u with PCP for management.   4.Goal of care discussion, DNR/DNI, Social Support  -The patient understands the goal of care is palliative. -I recommend DNR/DNI, she agrees  -She does not want to move in with anyone of her family members and rather have help from social services or home care service. I dicussed this may be out of pocket cost.  -I encouraged her to set up will and POA (her son) to direct her medical care.  5. Portal Vein Thrombosis  -Seen on 08/17/19 she has thrombosisinvolving the main portal vein andright portal  vein extending retrograde into the most distal aspect the splenic vein. -Continue Xarelto -She did have h/o hematochezia, will monitor.   PLAN: -CT  abdomen pelvis with contrast next week -Follow-up after CT scan, and I will discuss treatment options.   No problem-specific Assessment & Plan notes found for this encounter.   Orders Placed This Encounter  Procedures  . CT Abdomen Pelvis W Contrast    Standing Status:   Future    Standing Expiration Date:   01/05/2021    Order Specific Question:   If indicated for the ordered procedure, I authorize the administration of contrast media per Radiology protocol    Answer:   Yes    Order Specific Question:   Preferred imaging location?    Answer:   [facility]    Order Specific Question:   Release to patient    Answer:   Immediate    Order Specific Question:   Is Oral Contrast requested for this exam?    Answer:   Yes, Per Radiology protocol  . CT Abdomen Pelvis W Contrast    Standing Status:   Future    Standing Expiration Date:   01/05/2021    Order Specific Question:   If indicated for the ordered procedure, I authorize the administration of contrast media per Radiology protocol    Answer:   Yes    Order Specific Question:   Preferred imaging location?    Answer:   [facility]    Order Specific Question:   Release to patient    Answer:   Immediate    Order Specific Question:   Is Oral Contrast requested for this exam?    Answer:   Yes, Per Radiology protocol   All questions were answered. The patient knows to call the clinic with any problems, questions or concerns. No barriers to learning was detected. The total time spent in the appointment was 30 minutes.     Truitt Merle, MD 01/06/2020   I, Joslyn Devon, am acting as scribe for Truitt Merle, MD.   I have reviewed the above documentation for accuracy and completeness, and I agree with the above.

## 2020-01-06 ENCOUNTER — Inpatient Hospital Stay: Payer: Medicare HMO | Attending: Physician Assistant | Admitting: Hematology

## 2020-01-06 ENCOUNTER — Inpatient Hospital Stay: Payer: Medicare HMO

## 2020-01-06 ENCOUNTER — Other Ambulatory Visit: Payer: Self-pay

## 2020-01-06 ENCOUNTER — Encounter: Payer: Self-pay | Admitting: Hematology

## 2020-01-06 VITALS — BP 190/64 | HR 60 | Temp 98.2°F | Resp 18 | Ht 62.0 in | Wt 145.8 lb

## 2020-01-06 DIAGNOSIS — Z923 Personal history of irradiation: Secondary | ICD-10-CM | POA: Diagnosis not present

## 2020-01-06 DIAGNOSIS — C221 Intrahepatic bile duct carcinoma: Secondary | ICD-10-CM

## 2020-01-06 DIAGNOSIS — J449 Chronic obstructive pulmonary disease, unspecified: Secondary | ICD-10-CM | POA: Insufficient documentation

## 2020-01-06 DIAGNOSIS — M1990 Unspecified osteoarthritis, unspecified site: Secondary | ICD-10-CM | POA: Insufficient documentation

## 2020-01-06 DIAGNOSIS — R6881 Early satiety: Secondary | ICD-10-CM | POA: Insufficient documentation

## 2020-01-06 DIAGNOSIS — Z7901 Long term (current) use of anticoagulants: Secondary | ICD-10-CM | POA: Diagnosis not present

## 2020-01-06 DIAGNOSIS — E1136 Type 2 diabetes mellitus with diabetic cataract: Secondary | ICD-10-CM | POA: Diagnosis not present

## 2020-01-06 DIAGNOSIS — I10 Essential (primary) hypertension: Secondary | ICD-10-CM | POA: Insufficient documentation

## 2020-01-06 DIAGNOSIS — Z791 Long term (current) use of non-steroidal anti-inflammatories (NSAID): Secondary | ICD-10-CM | POA: Diagnosis not present

## 2020-01-06 DIAGNOSIS — Z452 Encounter for adjustment and management of vascular access device: Secondary | ICD-10-CM | POA: Diagnosis not present

## 2020-01-06 DIAGNOSIS — F418 Other specified anxiety disorders: Secondary | ICD-10-CM | POA: Diagnosis not present

## 2020-01-06 DIAGNOSIS — Z86718 Personal history of other venous thrombosis and embolism: Secondary | ICD-10-CM | POA: Diagnosis not present

## 2020-01-06 DIAGNOSIS — Z95828 Presence of other vascular implants and grafts: Secondary | ICD-10-CM

## 2020-01-06 DIAGNOSIS — Z79899 Other long term (current) drug therapy: Secondary | ICD-10-CM | POA: Insufficient documentation

## 2020-01-06 DIAGNOSIS — R634 Abnormal weight loss: Secondary | ICD-10-CM | POA: Diagnosis not present

## 2020-01-06 DIAGNOSIS — Z9221 Personal history of antineoplastic chemotherapy: Secondary | ICD-10-CM | POA: Insufficient documentation

## 2020-01-06 DIAGNOSIS — K219 Gastro-esophageal reflux disease without esophagitis: Secondary | ICD-10-CM | POA: Diagnosis not present

## 2020-01-06 DIAGNOSIS — E785 Hyperlipidemia, unspecified: Secondary | ICD-10-CM | POA: Insufficient documentation

## 2020-01-06 DIAGNOSIS — Z7984 Long term (current) use of oral hypoglycemic drugs: Secondary | ICD-10-CM | POA: Diagnosis not present

## 2020-01-06 LAB — CBC WITH DIFFERENTIAL (CANCER CENTER ONLY)
Abs Immature Granulocytes: 0.01 10*3/uL (ref 0.00–0.07)
Basophils Absolute: 0 10*3/uL (ref 0.0–0.1)
Basophils Relative: 0 %
Eosinophils Absolute: 0 10*3/uL (ref 0.0–0.5)
Eosinophils Relative: 1 %
HCT: 37.9 % (ref 36.0–46.0)
Hemoglobin: 12.3 g/dL (ref 12.0–15.0)
Immature Granulocytes: 0 %
Lymphocytes Relative: 30 %
Lymphs Abs: 1.5 10*3/uL (ref 0.7–4.0)
MCH: 28.5 pg (ref 26.0–34.0)
MCHC: 32.5 g/dL (ref 30.0–36.0)
MCV: 87.9 fL (ref 80.0–100.0)
Monocytes Absolute: 0.5 10*3/uL (ref 0.1–1.0)
Monocytes Relative: 10 %
Neutro Abs: 3 10*3/uL (ref 1.7–7.7)
Neutrophils Relative %: 59 %
Platelet Count: 107 10*3/uL — ABNORMAL LOW (ref 150–400)
RBC: 4.31 MIL/uL (ref 3.87–5.11)
RDW: 15.2 % (ref 11.5–15.5)
WBC Count: 5 10*3/uL (ref 4.0–10.5)
nRBC: 0 % (ref 0.0–0.2)

## 2020-01-06 LAB — CMP (CANCER CENTER ONLY)
ALT: 22 U/L (ref 0–44)
AST: 39 U/L (ref 15–41)
Albumin: 3 g/dL — ABNORMAL LOW (ref 3.5–5.0)
Alkaline Phosphatase: 109 U/L (ref 38–126)
Anion gap: 9 (ref 5–15)
BUN: 7 mg/dL — ABNORMAL LOW (ref 8–23)
CO2: 29 mmol/L (ref 22–32)
Calcium: 9 mg/dL (ref 8.9–10.3)
Chloride: 100 mmol/L (ref 98–111)
Creatinine: 0.65 mg/dL (ref 0.44–1.00)
GFR, Est AFR Am: >60 mL/min (ref >60)
GFR, Estimated: >60 mL/min (ref >60)
Glucose, Bld: 108 mg/dL — ABNORMAL HIGH (ref 70–99)
Potassium: 3.3 mmol/L — ABNORMAL LOW (ref 3.5–5.1)
Sodium: 138 mmol/L (ref 135–145)
Total Bilirubin: 0.7 mg/dL (ref 0.3–1.2)
Total Protein: 7 g/dL (ref 6.5–8.1)

## 2020-01-06 MED ORDER — HEPARIN SOD (PORK) LOCK FLUSH 100 UNIT/ML IV SOLN
500.0000 [IU] | Freq: Once | INTRAVENOUS | Status: AC | PRN
  Administered 2020-01-06: 500 [IU]
  Filled 2020-01-06: qty 5

## 2020-01-06 MED ORDER — OXYCODONE HCL 5 MG PO TABS: 2.5000 mg | ORAL_TABLET | Freq: Three times a day (TID) | ORAL | 0 refills | Status: DC | PRN

## 2020-01-06 MED ORDER — MIRTAZAPINE 15 MG PO TBDP: 15.0000 mg | ORAL_TABLET | Freq: Every day | ORAL | 2 refills | Status: DC

## 2020-01-06 MED ORDER — SODIUM CHLORIDE 0.9% FLUSH
10.0000 mL | INTRAVENOUS | Status: DC | PRN
  Administered 2020-01-06: 10 mL
  Filled 2020-01-06: qty 10

## 2020-01-06 MED FILL — oxyCODONE HCL 5 MG TABS: 5 | 5 days supply | Qty: 15 | Fill #0

## 2020-01-06 MED FILL — MIRTAZAPINE 15 MG ODT: 15 | 30 days supply | Qty: 30 | Fill #0

## 2020-01-06 NOTE — Patient Instructions (Signed)

## 2020-01-07 LAB — URINE CULTURE

## 2020-01-10 ENCOUNTER — Other Ambulatory Visit: Payer: Self-pay

## 2020-01-10 ENCOUNTER — Telehealth: Payer: Self-pay

## 2020-01-10 ENCOUNTER — Other Ambulatory Visit: Payer: Self-pay | Admitting: Hematology

## 2020-01-10 MED ORDER — POTASSIUM CHLORIDE ER 10 MEQ PO TBCR: 10.0000 meq | EXTENDED_RELEASE_TABLET | Freq: Every day | ORAL | 0 refills | Status: DC

## 2020-01-10 MED FILL — POTASSIUM CL ER 10 MEQ TAB: 10 | 10 days supply | Qty: 10 | Fill #0

## 2020-01-10 NOTE — Telephone Encounter (Signed)
Spoke with patient about UA results pt states she would like another appt to submit urine she will speak with scheduling to schedule her appt so she can followup with her calendar pt asks to have scheduling call her   Scheduling message sent

## 2020-01-10 NOTE — Progress Notes (Signed)
Reorder urine collection

## 2020-01-10 NOTE — Telephone Encounter (Signed)
-----   Message from Truitt Merle, MD sent at 01/10/2020  8:18 AM EDT ----- Please let pt know her urine culture result, it was probably not collected appropriately. If she still has UTI symptoms, please schedule her to come in for UA and repeated urine culture (please place orders) , thanks   Truitt Merle  01/10/2020

## 2020-01-11 ENCOUNTER — Emergency Department (HOSPITAL_COMMUNITY)
Admission: EM | Admit: 2020-01-11 | Discharge: 2020-01-11 | Disposition: A | Discharge status: Home / Self Care | Source: Home / Self Care | Attending: Emergency Medicine | Admitting: Emergency Medicine

## 2020-01-11 ENCOUNTER — Telehealth: Payer: Self-pay

## 2020-01-11 ENCOUNTER — Other Ambulatory Visit (HOSPITAL_COMMUNITY): Payer: Self-pay | Admitting: Emergency Medicine

## 2020-01-11 ENCOUNTER — Emergency Department (HOSPITAL_COMMUNITY)

## 2020-01-11 ENCOUNTER — Other Ambulatory Visit: Payer: Self-pay

## 2020-01-11 ENCOUNTER — Encounter (HOSPITAL_COMMUNITY): Payer: Self-pay

## 2020-01-11 DIAGNOSIS — J449 Chronic obstructive pulmonary disease, unspecified: Secondary | ICD-10-CM | POA: Insufficient documentation

## 2020-01-11 DIAGNOSIS — K219 Gastro-esophageal reflux disease without esophagitis: Secondary | ICD-10-CM | POA: Diagnosis present

## 2020-01-11 DIAGNOSIS — Z8505 Personal history of malignant neoplasm of liver: Secondary | ICD-10-CM | POA: Insufficient documentation

## 2020-01-11 DIAGNOSIS — Z87891 Personal history of nicotine dependence: Secondary | ICD-10-CM | POA: Insufficient documentation

## 2020-01-11 DIAGNOSIS — Z7901 Long term (current) use of anticoagulants: Secondary | ICD-10-CM | POA: Diagnosis not present

## 2020-01-11 DIAGNOSIS — E119 Type 2 diabetes mellitus without complications: Secondary | ICD-10-CM | POA: Insufficient documentation

## 2020-01-11 DIAGNOSIS — Z95828 Presence of other vascular implants and grafts: Secondary | ICD-10-CM | POA: Diagnosis not present

## 2020-01-11 DIAGNOSIS — R1031 Right lower quadrant pain: Secondary | ICD-10-CM | POA: Diagnosis not present

## 2020-01-11 DIAGNOSIS — Z7189 Other specified counseling: Secondary | ICD-10-CM | POA: Diagnosis not present

## 2020-01-11 DIAGNOSIS — E559 Vitamin D deficiency, unspecified: Secondary | ICD-10-CM | POA: Diagnosis present

## 2020-01-11 DIAGNOSIS — G4733 Obstructive sleep apnea (adult) (pediatric): Secondary | ICD-10-CM | POA: Diagnosis present

## 2020-01-11 DIAGNOSIS — Z833 Family history of diabetes mellitus: Secondary | ICD-10-CM | POA: Diagnosis not present

## 2020-01-11 DIAGNOSIS — Z7984 Long term (current) use of oral hypoglycemic drugs: Secondary | ICD-10-CM | POA: Insufficient documentation

## 2020-01-11 DIAGNOSIS — R1084 Generalized abdominal pain: Secondary | ICD-10-CM | POA: Insufficient documentation

## 2020-01-11 DIAGNOSIS — Z8249 Family history of ischemic heart disease and other diseases of the circulatory system: Secondary | ICD-10-CM | POA: Diagnosis not present

## 2020-01-11 DIAGNOSIS — F32A Depression, unspecified: Secondary | ICD-10-CM | POA: Diagnosis present

## 2020-01-11 DIAGNOSIS — K7689 Other specified diseases of liver: Secondary | ICD-10-CM | POA: Diagnosis not present

## 2020-01-11 DIAGNOSIS — E876 Hypokalemia: Secondary | ICD-10-CM | POA: Diagnosis not present

## 2020-01-11 DIAGNOSIS — I272 Pulmonary hypertension, unspecified: Secondary | ICD-10-CM | POA: Diagnosis present

## 2020-01-11 DIAGNOSIS — M1990 Unspecified osteoarthritis, unspecified site: Secondary | ICD-10-CM | POA: Diagnosis present

## 2020-01-11 DIAGNOSIS — Z79899 Other long term (current) drug therapy: Secondary | ICD-10-CM | POA: Insufficient documentation

## 2020-01-11 DIAGNOSIS — M109 Gout, unspecified: Secondary | ICD-10-CM | POA: Diagnosis present

## 2020-01-11 DIAGNOSIS — Z515 Encounter for palliative care: Secondary | ICD-10-CM | POA: Diagnosis not present

## 2020-01-11 DIAGNOSIS — E860 Dehydration: Secondary | ICD-10-CM | POA: Diagnosis not present

## 2020-01-11 DIAGNOSIS — E785 Hyperlipidemia, unspecified: Secondary | ICD-10-CM | POA: Diagnosis present

## 2020-01-11 DIAGNOSIS — E1136 Type 2 diabetes mellitus with diabetic cataract: Secondary | ICD-10-CM | POA: Diagnosis not present

## 2020-01-11 DIAGNOSIS — R112 Nausea with vomiting, unspecified: Secondary | ICD-10-CM | POA: Diagnosis not present

## 2020-01-11 DIAGNOSIS — F411 Generalized anxiety disorder: Secondary | ICD-10-CM | POA: Diagnosis present

## 2020-01-11 DIAGNOSIS — K589 Irritable bowel syndrome without diarrhea: Secondary | ICD-10-CM | POA: Diagnosis present

## 2020-01-11 DIAGNOSIS — I10 Essential (primary) hypertension: Secondary | ICD-10-CM | POA: Insufficient documentation

## 2020-01-11 DIAGNOSIS — Z884 Allergy status to anesthetic agent status: Secondary | ICD-10-CM | POA: Diagnosis not present

## 2020-01-11 DIAGNOSIS — R109 Unspecified abdominal pain: Secondary | ICD-10-CM | POA: Diagnosis not present

## 2020-01-11 DIAGNOSIS — Z20822 Contact with and (suspected) exposure to covid-19: Secondary | ICD-10-CM | POA: Diagnosis not present

## 2020-01-11 DIAGNOSIS — C799 Secondary malignant neoplasm of unspecified site: Secondary | ICD-10-CM | POA: Diagnosis not present

## 2020-01-11 DIAGNOSIS — G893 Neoplasm related pain (acute) (chronic): Secondary | ICD-10-CM | POA: Diagnosis not present

## 2020-01-11 DIAGNOSIS — C221 Intrahepatic bile duct carcinoma: Secondary | ICD-10-CM | POA: Diagnosis not present

## 2020-01-11 LAB — BASIC METABOLIC PANEL
Anion gap: 18 — ABNORMAL HIGH (ref 5–15)
BUN: 11 mg/dL (ref 8–23)
CO2: 21 mmol/L — ABNORMAL LOW (ref 22–32)
Calcium: 9.5 mg/dL (ref 8.9–10.3)
Chloride: 100 mmol/L (ref 98–111)
Creatinine, Ser: 0.7 mg/dL (ref 0.44–1.00)
GFR calc Af Amer: >60 mL/min (ref >60)
GFR calc non Af Amer: >60 mL/min (ref >60)
Glucose, Bld: 165 mg/dL — ABNORMAL HIGH (ref 70–99)
Potassium: 3.7 mmol/L (ref 3.5–5.1)
Sodium: 139 mmol/L (ref 135–145)

## 2020-01-11 LAB — PROTIME-INR
INR: 1.1 (ref 0.8–1.2)
Prothrombin Time: 13.4 seconds (ref 11.4–15.2)

## 2020-01-11 LAB — CBC WITH DIFFERENTIAL/PLATELET
Abs Immature Granulocytes: 0.01 10*3/uL (ref 0.00–0.07)
Basophils Absolute: 0 10*3/uL (ref 0.0–0.1)
Basophils Relative: 0 %
Eosinophils Absolute: 0 10*3/uL (ref 0.0–0.5)
Eosinophils Relative: 0 %
HCT: 41.7 % (ref 36.0–46.0)
Hemoglobin: 13.4 g/dL (ref 12.0–15.0)
Immature Granulocytes: 0 %
Lymphocytes Relative: 16 %
Lymphs Abs: 1 10*3/uL (ref 0.7–4.0)
MCH: 28.3 pg (ref 26.0–34.0)
MCHC: 32.1 g/dL (ref 30.0–36.0)
MCV: 88 fL (ref 80.0–100.0)
Monocytes Absolute: 0.4 10*3/uL (ref 0.1–1.0)
Monocytes Relative: 6 %
Neutro Abs: 4.8 10*3/uL (ref 1.7–7.7)
Neutrophils Relative %: 78 %
Platelets: 133 10*3/uL — ABNORMAL LOW (ref 150–400)
RBC: 4.74 MIL/uL (ref 3.87–5.11)
RDW: 15.3 % (ref 11.5–15.5)
WBC: 6.2 10*3/uL (ref 4.0–10.5)
nRBC: 0 % (ref 0.0–0.2)

## 2020-01-11 LAB — TROPONIN I (HIGH SENSITIVITY)
Troponin I (High Sensitivity): 9 ng/L (ref <18)
Troponin I (High Sensitivity): 13 ng/L (ref <18)

## 2020-01-11 LAB — URINALYSIS, ROUTINE W REFLEX MICROSCOPIC
Bilirubin Urine: NEGATIVE
Glucose, UA: NEGATIVE mg/dL
Hgb urine dipstick: NEGATIVE
Ketones, ur: 80 mg/dL — AB
Leukocytes,Ua: NEGATIVE
Nitrite: NEGATIVE
Protein, ur: 30 mg/dL — AB
Specific Gravity, Urine: >1.046 — ABNORMAL HIGH (ref 1.005–1.030)
pH: 6 (ref 5.0–8.0)

## 2020-01-11 LAB — HEPATIC FUNCTION PANEL
ALT: 25 U/L (ref 0–44)
AST: 41 U/L (ref 15–41)
Albumin: 3.6 g/dL (ref 3.5–5.0)
Alkaline Phosphatase: 97 U/L (ref 38–126)
Bilirubin, Direct: 0.2 mg/dL (ref 0.0–0.2)
Indirect Bilirubin: 1.2 mg/dL — ABNORMAL HIGH (ref 0.3–0.9)
Total Bilirubin: 1.4 mg/dL — ABNORMAL HIGH (ref 0.3–1.2)
Total Protein: 7.4 g/dL (ref 6.5–8.1)

## 2020-01-11 LAB — LIPASE, BLOOD: Lipase: 21 U/L (ref 11–51)

## 2020-01-11 MED ORDER — SODIUM CHLORIDE (PF) 0.9 % IJ SOLN
INTRAMUSCULAR | Status: AC
  Filled 2020-01-11: qty 50

## 2020-01-11 MED ORDER — DICYCLOMINE HCL 20 MG PO TABS: 20.0000 mg | ORAL_TABLET | Freq: Two times a day (BID) | ORAL | 0 refills | Status: DC | PRN

## 2020-01-11 MED ORDER — ONDANSETRON HCL 4 MG/2ML IJ SOLN
4.0000 mg | Freq: Once | INTRAMUSCULAR | Status: AC
  Administered 2020-01-11: 4 mg via INTRAVENOUS
  Filled 2020-01-11: qty 2

## 2020-01-11 MED ORDER — METOCLOPRAMIDE HCL 10 MG PO TABS: 10.0000 mg | ORAL_TABLET | Freq: Four times a day (QID) | ORAL | 0 refills | Status: DC | PRN

## 2020-01-11 MED ORDER — HYDROMORPHONE HCL 1 MG/ML IJ SOLN
1.0000 mg | Freq: Once | INTRAMUSCULAR | Status: AC
  Administered 2020-01-11: 1 mg via INTRAVENOUS
  Filled 2020-01-11: qty 1

## 2020-01-11 MED ORDER — IOHEXOL 300 MG/ML  SOLN
100.0000 mL | Freq: Once | INTRAMUSCULAR | Status: AC | PRN
  Administered 2020-01-11: 100 mL via INTRAVENOUS

## 2020-01-11 MED FILL — DICYCLOMINE 20 MG TABLET: 20 | 10 days supply | Qty: 20 | Fill #0

## 2020-01-11 MED FILL — METOCLOPRAMIDE 10 MG TABLET: 10 | 8 days supply | Qty: 30 | Fill #0

## 2020-01-11 NOTE — ED Triage Notes (Signed)
Pt from home c/o severe abd pain  With N/V denies diarrhea. Pt states she is a DNR

## 2020-01-11 NOTE — ED Notes (Signed)
Patient back from CT.

## 2020-01-11 NOTE — ED Notes (Signed)
Patient transported to CT 

## 2020-01-11 NOTE — Telephone Encounter (Signed)
-----   Message from Truitt Merle, MD sent at 01/10/2020  8:18 AM EDT ----- Please let pt know her urine culture result, it was probably not collected appropriately. If she still has UTI symptoms, please schedule her to come in for UA and repeated urine culture (please place orders) , thanks   Truitt Merle  01/10/2020

## 2020-01-11 NOTE — ED Provider Notes (Signed)
[facility] DEPT Provider Note   CSN: [id number] Arrival date & time: 01/11/20  7902     History Chief Complaint  Patient presents with  . Abdominal Pain    Bonnie Peterson is a 74 y.o. female.  Patient with previous history of intrahepatic cholangiocarcinoma presents to the emergency department for evaluation of abdominal pain.  Patient reports that she thought she had acid reflux because she had some discomfort in the central portion of her upper abdomen which has now progressed to severe constant pain.  Pain radiates around into the back.  She has had nausea and vomiting.  She has not had a bowel movement in at least 1 day.  No rectal bleeding, hematemesis.        Past Medical History:  Diagnosis Date  . Anxiety   . Benign positional vertigo   . Cancer ([place]) 02/09/15   intrahepatic cholangiocarcinoma  . COPD (chronic obstructive pulmonary disease) ([place])   . Depression   . Diabetes mellitus without complication ([place])   . Early cataracts, bilateral 10/13   Optho, Dr Gershon Crane  . Echocardiogram findings abnormal, without diagnosis 10/10   10/10: mild pulm HTN, EF 60-65%, mild LVH, moderate aortic regurg  . GERD (gastroesophageal reflux disease)    I have "acid reflux"  . Gout   . HLD (hyperlipidemia)   . HTN, goal below 130/80   . Irritable bowel syndrome   . Obesity, Class III, BMI 40-49.9 (morbid obesity) ([place])   . Obstructive sleep apnea    wears cpap  . Osteoarthritis (arthritis due to wear and tear of joints)    also gout  . Restrictive lung disease     Patient Active Problem List   Diagnosis Date Noted  . Rectal bleeding   . Depression 07/19/2019  . Goals of care, counseling/discussion 06/11/2019  . Blood in stool 06/11/2019  . Acute pain of left lower extremity 01/05/2019  . Gastroesophageal reflux disease without esophagitis 06/03/2018  . Lung nodules 05/04/2018  . Type 2 diabetes mellitus without complication, without  long-term current use of insulin ([place]) 01/05/2017  . Healthcare maintenance 01/24/2016  . Back pain 11/23/2015  . Port catheter in place 11/10/2015  . 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 Intrahepatic cholangiocarcinoma ([place]) 03/27/2015  . History of resection of liver 02/10/2015  . 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 Inadequate sleep hygiene 08/22/2014  . 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 IBS (irritable bowel syndrome) 03/22/2013  . Liver mass, left lobe 02/24/2013  . Osteoporosis 10/15/2012  . Vitamin D deficiency 12/23/2011  . Cigarette smoker 10/12/2010  . Chronic obstructive pulmonary disease ([place]) 06/14/2010  . Obstructive sleep apnea 04/27/2009  . OBESITY, UNSPECIFIED 04/26/2009  . Hyperlipidemia 11/25/2007  . GOUT NOS 12/24/2006  . Recurrent major depressive disorder ([place]) 06/05/2006  . Anxiety 06/05/2006  . Tobacco use 06/05/2006  . HYPERTENSION, BENIGN SYSTEMIC 06/05/2006  . Osteoarthritis 06/05/2006  . Generalized anxiety disorder 06/05/2006    Past Surgical History:  Procedure Laterality Date  . ABDOMINAL HYSTERECTOMY    . COLONOSCOPY WITH PROPOFOL N/A 08/12/2019   Procedure: COLONOSCOPY WITH PROPOFOL;  Surgeon: [name], MD;  Location: [facility] ENDOSCOPY;  Service: Endoscopy;  Laterality: N/A;  . ESOPHAGOGASTRODUODENOSCOPY N/A 06/15/2015   Procedure: ESOPHAGOGASTRODUODENOSCOPY (EGD);  Surgeon: Gatha Mayer, MD;  Location: [facility];  Service: Endoscopy;  Laterality: N/A;  . IR IMAGING GUIDED PORT INSERTION  06/15/2019  . IR REMOVAL TUN ACCESS W/ PORT W/O FL  MOD SED  11/11/2016  . LIVER BIOPSY    . OPEN PARTIAL HEPATECTOMY  Left 02/09/15  . PARTIAL HYSTERECTOMY    . ROTATOR CUFF REPAIR       L rotator cuff repair 11/07-[place] - 12/7/200  . TONSILLECTOMY       OB History   No obstetric history on file.     Family History  Problem Relation Age of Onset  . Breast cancer Mother   . Hypertension Mother   . Coronary artery  disease Mother   . Diabetes type II Mother   . Rheum arthritis Mother   . Diabetes type II Sister   . Pancreatic cancer Sister     Social History   Tobacco Use  . Smoking status: Former Smoker    Packs/day: 0.50    Years: 49.00    Pack years: 24.50    Types: Cigarettes    Quit date: 04/13/2018    Years since quitting: 1.7  . Smokeless tobacco: Never Used  Vaping Use  . Vaping Use: Never used  Substance Use Topics  . Alcohol use: Yes    Alcohol/week: 0.0 standard drinks    Comment: occasionally  . Drug use: Yes    Types: Marijuana    Home Medications Prior to Admission medications   Medication Sig Start Date End Date Taking? Authorizing Provider  albuterol (PROAIR HFA) 108 (90 Base) MCG/ACT inhaler Inhale 1-2 puffs into the lungs every 6 (six) hours as needed for wheezing or shortness of breath. 07/23/19   Parrett, Fonnie Mu, NP  alendronate (FOSAMAX) 70 MG tablet TAKE 1 TABLET  ONCE A WEEK. Patient taking differently: Take 70 mg by mouth every Sunday. TAKE 1 TABLET  ONCE A WEEK. 03/02/19   Lattie Haw, MD  amLODipine (NORVASC) 10 MG tablet TAKE 1 TABLET EVERY DAY Patient taking differently: Take 10 mg by mouth daily.  06/03/19   Lattie Haw, MD  atenolol (TENORMIN) 25 MG tablet Take 1 tablet (25 mg total) by mouth daily. 01/08/19   Lattie Haw, MD  cholecalciferol (VITAMIN D) 1000 units tablet Take 1 tablet (1,000 Units total) by mouth daily. 01/03/17   Bufford Lope, DO  diclofenac sodium (VOLTAREN) 1 % GEL Apply 2 g topically 4 (four) times daily as needed. 01/26/18   Bufford Lope, DO  famotidine (PEPCID) 20 MG tablet Take 1 tablet (20 mg total) by mouth 2 (two) times daily. 06/26/19   Lattie Haw, MD  fish oil-omega-3 fatty acids 1000 MG capsule Take 1 g by mouth every 30 (thirty) days.     [provider]  fluticasone (FLONASE) 50 MCG/ACT nasal spray Place 2 sprays into both nostrils daily as needed for rhinitis. 04/17/16   Haney, Yetta Flock A, MD  hydrochlorothiazide  (HYDRODIURIL) 25 MG tablet Take 1 tablet (25 mg total) by mouth daily. 03/02/19   Lattie Haw, MD  Hyprom-Naphaz-Polysorb-Zn Sulf (CLEAR EYES COMPLETE OP) Place 1 drop into both eyes daily as needed (dry eyes).     [provider]  lidocaine-prilocaine (EMLA) cream Apply 1 application topically every 14 (fourteen) days. Every 14 days as needed Patient taking differently: Apply 1 application topically as needed (port access).  06/17/19   Truitt Merle, MD  lisinopril (ZESTRIL) 40 MG tablet Take 1 tablet (40 mg total) by mouth daily. 03/02/19   Lattie Haw, MD  loratadine (CLARITIN) 10 MG tablet Take 1 tablet (10 mg total) by mouth daily as needed for allergies. 09/23/12   Waldemar Dickens, MD  LORazepam (ATIVAN) 0.5 MG tablet Take 0.5-1 tablets (0.25-0.5 mg total) by mouth at bedtime as needed for anxiety. Patient taking differently: Take 0.5 mg by mouth at bedtime as needed for anxiety.  07/06/19   Alla Feeling, NP  metFORMIN (GLUCOPHAGE) 500 MG tablet TAKE 2 TABLETS TWICE DAILY WITH MEALS Patient taking differently: Take 1,000 mg by mouth 2 (two) times daily with a meal. TAKE 2 TABLETS TWICE DAILY WITH MEALS 03/02/19   Lattie Haw, MD  mirtazapine (REMERON SOL-TAB) 15 MG disintegrating tablet Take 1 tablet (15 mg total) by mouth at bedtime. 01/06/20 01/05/21  Truitt Merle, MD  Multiple Vitamin (MULTIVITAMIN WITH MINERALS) TABS tablet Take 1 tablet by mouth daily.    [provider]  Na Sulfate-K Sulfate-Mg Sulf (SUPREP BOWEL PREP KIT) 17.5-3.13-1.6 GM/177ML SOLN Take 1 kit by mouth as directed. 07/21/19   [name], MD  ondansetron (ZOFRAN) 4 MG tablet Take 4 mg by mouth every 8 (eight) hours as needed for nausea or vomiting.    [provider]  oxyCODONE (OXY IR/ROXICODONE) 5 MG immediate release tablet Take 0.5-1 tablets (2.5-5 mg total) by mouth every 8 (eight) hours as needed for severe pain. 01/06/20   Truitt Merle, MD  potassium chloride (KLOR-CON) 10 MEQ tablet Take 1  tablet (10 mEq total) by mouth daily. 01/10/20   Truitt Merle, MD  prochlorperazine (COMPAZINE) 10 MG tablet Take 10 mg by mouth every 6 (six) hours as needed for nausea or vomiting.    [provider]  RIVAROXABAN Alveda Reasons) VTE STARTER PACK (15 & 20 MG TABLETS) Follow package directions: Take one 63m tablet by mouth twice a day. On day 22, switch to one 252mtablet once a day. Take with food. 08/26/19   FeTruitt MerleMD  traMADol (ULTRAM) 50 MG tablet Take 1 tablet (50 mg total) by mouth every 6 (six) hours as needed. 12/20/19   FeTruitt MerleMD  VITAMIN E PO Take 1 tablet by mouth See admin instructions. 2-3 times a month    [provider]    Allergies    Other and Propofol  Review of Systems   Review of Systems  Gastrointestinal: Positive for abdominal pain, nausea and vomiting.  All other systems reviewed and are negative.   Physical Exam Updated Vital Signs BP (!) 142/116 (BP Location: Left Arm)   Pulse 76   Temp (!) 97.5 F (36.4 C) (Oral)   Resp 17   Ht 5' 2"  (1.575 m)   Wt 67.1 kg   SpO2 97%   BMI 27.07 kg/m   Physical Exam Vitals and nursing note reviewed.  Constitutional:      General: She is not in acute distress.    Appearance: Normal appearance. She is well-developed.  HENT:     Head: Normocephalic and atraumatic.     Right Ear: Hearing normal.     Left Ear: Hearing normal.     Nose: Nose normal.  Eyes:     Conjunctiva/sclera: Conjunctivae normal.     Pupils: Pupils are equal, round, and reactive to light.  Cardiovascular:     Rate and Rhythm: Regular rhythm.     Heart sounds: S1 normal and S2 normal. No murmur heard.  No friction rub. No gallop.   Pulmonary:     Effort: Pulmonary effort is normal. No respiratory distress.     Breath sounds: Normal breath sounds.  Chest:     Chest wall: No tenderness.  Abdominal:     General: Bowel  sounds are decreased. There is distension.     Palpations: Abdomen is soft.     Tenderness: There is generalized  abdominal tenderness. There is no guarding or rebound. Negative signs include Murphy's sign and McBurney's sign.     Hernia: No hernia is present.  Musculoskeletal:        General: Normal range of motion.     Cervical back: Normal range of motion and neck supple.  Skin:    General: Skin is warm and dry.     Findings: No rash.  Neurological:     Mental Status: She is alert and oriented to person, place, and time.     GCS: GCS eye subscore is 4. GCS verbal subscore is 5. GCS motor subscore is 6.     Cranial Nerves: No cranial nerve deficit.     Sensory: No sensory deficit.     Coordination: Coordination normal.  Psychiatric:        Speech: Speech normal.        Behavior: Behavior normal.        Thought Content: Thought content normal.     ED Results / Procedures / Treatments   Labs (all labs ordered are listed, but only abnormal results are displayed) Labs Reviewed  CBC WITH DIFFERENTIAL/PLATELET - Abnormal; Notable for the following components:      Result Value   Platelets 133 (*)    All other components within normal limits  BASIC METABOLIC PANEL  HEPATIC FUNCTION PANEL  LIPASE, BLOOD  PROTIME-INR  TROPONIN I (HIGH SENSITIVITY)    EKG EKG Interpretation  Date/Time:  Tuesday January 11 2020 06:25:54 EDT Ventricular Rate:  61 PR Interval:  162 QRS Duration: 76 QT Interval:  490 QTC Calculation: 493 R Axis:   15 Text Interpretation: Normal sinus rhythm Possible Inferior infarct , age undetermined Cannot rule out Anterior infarct , age undetermined Abnormal ECG No significant change since last tracing Confirmed by Orpah Greek [phone]) on 01/11/2020 6:31:16 AM   Radiology No results found.  Procedures Procedures (including critical care time)  Medications Ordered in ED Medications  HYDROmorphone (DILAUDID) injection 1 mg (1 mg Intravenous Given 01/11/20 0608)  ondansetron (ZOFRAN) injection 4 mg (4 mg Intravenous Given 01/11/20 0608)    ED Course  I  have reviewed the triage vital signs and the nursing notes.  Pertinent labs & imaging results that were available during my care of the patient were reviewed by me and considered in my medical decision making (see chart for details).    MDM Rules/Calculators/A&P                          Patient presents to the emergency department for evaluation of abdominal pain.  Patient has a history of intrahepatic cholangiocarcinoma.  She was previously treated at [facility] but has chosen to stop all cancer treatments.  Patient with mild abdominal distention and diffuse tenderness but no obvious peritonitis.  We will perform basic labs and CT scan.  Patient will be signed out to oncoming ER physician to follow-up on results.  Final Clinical Impression(s) / ED Diagnoses Final diagnoses:  Generalized abdominal pain    Rx / DC Orders ED Discharge Orders    None       Orpah Greek, MD 01/11/20 [phone]

## 2020-01-11 NOTE — ED Notes (Signed)
Patient can have something to eat/drink per Billy Fischer, [place].

## 2020-01-11 NOTE — Telephone Encounter (Signed)
Spoke with pt about results and provider request to retest scheduling message sent for repeat UA

## 2020-01-11 NOTE — ED Triage Notes (Signed)
Pt c/o abd pain hx acid reflux, today pain started 1 day ago and slowly progressed to unbearable

## 2020-01-12 ENCOUNTER — Other Ambulatory Visit: Payer: Self-pay

## 2020-01-12 ENCOUNTER — Telehealth: Payer: Self-pay | Admitting: Emergency Medicine

## 2020-01-12 ENCOUNTER — Other Ambulatory Visit: Payer: Self-pay | Admitting: Medical

## 2020-01-12 ENCOUNTER — Telehealth: Payer: Self-pay | Admitting: Hematology

## 2020-01-12 ENCOUNTER — Inpatient Hospital Stay: Payer: Medicare HMO | Attending: Physician Assistant | Admitting: Medical

## 2020-01-12 ENCOUNTER — Telehealth: Payer: Self-pay

## 2020-01-12 ENCOUNTER — Encounter: Payer: Self-pay | Admitting: Medical

## 2020-01-12 VITALS — BP 195/58 | HR 64 | Temp 97.8°F | Resp 18 | Ht 62.0 in

## 2020-01-12 DIAGNOSIS — Z95828 Presence of other vascular implants and grafts: Secondary | ICD-10-CM

## 2020-01-12 DIAGNOSIS — G893 Neoplasm related pain (acute) (chronic): Secondary | ICD-10-CM | POA: Diagnosis not present

## 2020-01-12 DIAGNOSIS — R112 Nausea with vomiting, unspecified: Secondary | ICD-10-CM

## 2020-01-12 DIAGNOSIS — C221 Intrahepatic bile duct carcinoma: Secondary | ICD-10-CM | POA: Diagnosis not present

## 2020-01-12 DIAGNOSIS — E860 Dehydration: Secondary | ICD-10-CM

## 2020-01-12 MED ORDER — ONDANSETRON HCL 4 MG/2ML IJ SOLN
8.0000 mg | Freq: Once | INTRAMUSCULAR | Status: AC
  Administered 2020-01-12: 8 mg via INTRAVENOUS

## 2020-01-12 MED ORDER — MORPHINE SULFATE (PF) 2 MG/ML IV SOLN
1.0000 mg | Freq: Once | INTRAVENOUS | Status: AC
  Administered 2020-01-12: 1 mg via INTRAVENOUS

## 2020-01-12 MED ORDER — SODIUM CHLORIDE 0.9% FLUSH
10.0000 mL | INTRAVENOUS | Status: DC | PRN
  Administered 2020-01-12: 10 mL
  Filled 2020-01-12: qty 10

## 2020-01-12 MED ORDER — SODIUM CHLORIDE 0.9 % IV SOLN
10.0000 mg | Freq: Once | INTRAVENOUS | Status: AC
  Administered 2020-01-12: 10 mg via INTRAVENOUS
  Filled 2020-01-12: qty 10

## 2020-01-12 MED ORDER — SODIUM CHLORIDE 0.9 % IV SOLN
Freq: Once | INTRAVENOUS | Status: AC
  Filled 2020-01-12: qty 250

## 2020-01-12 MED ORDER — MORPHINE SULFATE (PF) 2 MG/ML IV SOLN
INTRAVENOUS | Status: AC
Start: 2020-01-12 — End: ?
  Filled 2020-01-12: qty 1

## 2020-01-12 MED ORDER — ONDANSETRON 8 MG PO TBDP: 8.0000 mg | ORAL_TABLET | Freq: Three times a day (TID) | ORAL | 0 refills | Status: DC | PRN

## 2020-01-12 MED ORDER — HEPARIN SOD (PORK) LOCK FLUSH 100 UNIT/ML IV SOLN
500.0000 [IU] | Freq: Once | INTRAVENOUS | Status: AC | PRN
  Administered 2020-01-12: 500 [IU]
  Filled 2020-01-12: qty 5

## 2020-01-12 MED ORDER — ONDANSETRON HCL 4 MG/2ML IJ SOLN
INTRAMUSCULAR | Status: AC
  Filled 2020-01-12: qty 4

## 2020-01-12 MED FILL — ONDANSETRON ODT 8 MG TABLET: 8 | 7 days supply | Qty: 20 | Fill #0

## 2020-01-12 NOTE — Telephone Encounter (Signed)
Ms Shelby Mattocks left vm, Ms Steffensen was in the Ed yesterday and she wanted to know what the next steps would be.  I returned her call I left vm reviewing ms Cristiano's appts for tomorrow.  I encouraged her to come to the appt with Ms Kannan.

## 2020-01-12 NOTE — Patient Instructions (Signed)

## 2020-01-12 NOTE — Telephone Encounter (Signed)
Pt's daughter [name] calling to request additional advisement on pt's continued abd pain and N/V and weakness/fatigue after being seen in ED yesterday.  Desk RN Santiago Glad B made aware, states she will speak with MD Burr Medico and call pt/[name] back with more information.

## 2020-01-12 NOTE — Telephone Encounter (Signed)
Ms Bonnie Peterson called stating that [name] has been nauseated and vomiting since yesterday, not relieved by antiemetics.  [name] is having increased abdominal pain.  Appt made with Sandi Mealy for evaluation.

## 2020-01-12 NOTE — Progress Notes (Addendum)
[facility]pt[facility] Management [facility] Progress Note   Bonnie Peterson [id number] 28-Nov-1945 74 y.o.  Bonnie Peterson is managed by Dr. Truitt Merle  Actively treated with chemotherapy/immunotherapy/hormonal therapy: Manage most recently with observation  Next scheduled appointment with provider: [name] is being referred to hospice.  Assessment: Plan:    Non-intractable vomiting with nausea, unspecified vomiting type - Plan: ondansetron (ZOFRAN) injection 8 mg, dexamethasone (DECADRON) 10 mg in sodium chloride 0.9 % 50 mL IVPB, Ambulatory referral to Hospice, ondansetron (ZOFRAN ODT) 8 MG disintegrating tablet  Neoplasm related pain - Plan: morphine 2 MG/ML injection 1 mg, Ambulatory referral to Hospice  Dehydration - Plan: 0.9 %  sodium chloride infusion  Port catheter in place - Plan: heparin lock flush 100 unit/mL, sodium chloride flush (NS) 0.9 % injection 10 mL  Intrahepatic cholangiocarcinoma (HCC) - Plan: heparin lock flush 100 unit/mL, sodium chloride flush (NS) 0.9 % injection 10 mL, Ambulatory referral to Hospice   Nausea and vomiting: The patient was given Zofran 8 mg IV x1 and Decadron 10 mg IV x1.  She was additionally given a prescription for Zofran ODT 8 mg.  Dehydration: The patient was given 1 L of normal saline IV.  Metastatic progressive intrahepatic cholangiocarcinoma: Bonnie Peterson was seen with Dr. Morey Hummingbird today.  She has elected to be referred to hospice.  A referral has been made.  Abdominal pain: The patient was given morphine 1 mg IV x1 with improvement in her pain.  Please see After Visit Summary for patient specific instructions.  Future Appointments  Date Time Provider [place]  02/15/2020  1:30 PM Lattie Haw, MD [facility]    Orders Placed This Encounter  Procedures  . Ambulatory referral to Hospice       Subjective:   Patient ID:  Bonnie Peterson is a 74 y.o. (DOB 02/24/1946) female.  Chief Complaint:  Chief Complaint  Patient presents  with  . Nausea  . Emesis    HPI Bonnie Peterson  is a 74 y.o. female with a diagnosis of a metastatic progressive intra hepatic cholangiocarcinoma.  Bonnie Peterson has been followed by Dr. Truitt Merle and is most recently been managed with observation.  She was seen in the emergency room yesterday for nausea, vomiting, anorexia, and abdominal pain.  She is not having constipation and reports having a small bowel movement this morning.  A CT scan of the abdomen and pelvis returned showing:  IMPRESSION: 1. Today's study demonstrates progression of disease with increased number and size of numerous metastatic lesions within the liver, as well as progressive lymphadenopathy in the upper abdomen and retroperitoneum. 2. Mass-like thickening throughout the distal stomach and proximal duodenum, favored to be sequela of prior radiation therapy, although an underlying mass is difficult to entirely exclude. 3. Lipomatous polyps noted in the colon, largest of which is a pedunculated lesion which extends over a significant length of the lumen in the sigmoid colon, without frank intussusception at this time. 4. Additional incidental findings, as above.  Dr. Truitt Merle I discussed her case with physicians at [facility] who suggested that there was only one additional oral therapy that was available for the patient however there would be a significant risk of nausea and vomiting associated with this and would have limited chance of a response.  Based on this the patient was elected to proceed with a referral to hospice care.  Medications: I have reviewed the patient's current medications.  Allergies:  Allergies  Allergen  Reactions  . Other     Propofol - Anesthesia for MRI--made her "breathe funny"  . Propofol Other (See Comments)    "It makes me feel like I'm on fire."    Past Medical History:  Diagnosis Date  . Anxiety   . Benign positional vertigo   . Cancer ([place]) 02/09/15   intrahepatic  cholangiocarcinoma  . COPD (chronic obstructive pulmonary disease) ([place])   . Depression   . Diabetes mellitus without complication ([place])   . Early cataracts, bilateral 10/13   Optho, Dr Gershon Crane  . Echocardiogram findings abnormal, without diagnosis 10/10   10/10: mild pulm HTN, EF 60-65%, mild LVH, moderate aortic regurg  . GERD (gastroesophageal reflux disease)    I have "acid reflux"  . Gout   . HLD (hyperlipidemia)   . HTN, goal below 130/80   . Irritable bowel syndrome   . Obesity, Class III, BMI 40-49.9 (morbid obesity) ([place])   . Obstructive sleep apnea    wears cpap  . Osteoarthritis (arthritis due to wear and tear of joints)    also gout  . Restrictive lung disease     Past Surgical History:  Procedure Laterality Date  . ABDOMINAL HYSTERECTOMY    . COLONOSCOPY WITH PROPOFOL N/A 08/12/2019   Procedure: COLONOSCOPY WITH PROPOFOL;  Surgeon: [name], MD;  Location: [facility] ENDOSCOPY;  Service: Endoscopy;  Laterality: N/A;  . ESOPHAGOGASTRODUODENOSCOPY N/A 06/15/2015   Procedure: ESOPHAGOGASTRODUODENOSCOPY (EGD);  Surgeon: Gatha Mayer, MD;  Location: [facility];  Service: Endoscopy;  Laterality: N/A;  . IR IMAGING GUIDED PORT INSERTION  06/15/2019  . IR REMOVAL TUN ACCESS W/ PORT W/O FL MOD SED  11/11/2016  . LIVER BIOPSY    . OPEN PARTIAL HEPATECTOMY  Left 02/09/15  . PARTIAL HYSTERECTOMY    . ROTATOR CUFF REPAIR       L rotator cuff repair 11/07-[place] - 12/7/200  . TONSILLECTOMY      Family History  Problem Relation Age of Onset  . Breast cancer Mother   . Hypertension Mother   . Coronary artery disease Mother   . Diabetes type II Mother   . Rheum arthritis Mother   . Diabetes type II Sister   . Pancreatic cancer Sister     Social History   Socioeconomic History  . Marital status: Divorced    Spouse name: Not on file  . Number of children: 4  . Years of education: Not on file  . Highest education level: Not on file  Occupational History  . Occupation:  unemployed  Tobacco Use  . Smoking status: Former Smoker    Packs/day: 0.50    Years: 49.00    Pack years: 24.50    Types: Cigarettes    Quit date: 04/13/2018    Years since quitting: 1.7  . Smokeless tobacco: Never Used  Vaping Use  . Vaping Use: Never used  Substance and Sexual Activity  . Alcohol use: Yes    Alcohol/week: 0.0 standard drinks    Comment: occasionally  . Drug use: Yes    Types: Marijuana  . Sexual activity: Never  Other Topics Concern  . 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 Difficulty of Paying Living Expenses: Not on file  Food Insecurity:   . Worried About Charity fundraiser in the Last Year: Not on file  . Ran Out of Food in the Last Year: Not on file  Transportation Needs:   . Lack of Transportation (Medical): Not on file  . Lack of Transportation (Non-Medical): Not on file  Physical Activity:   . Days of Exercise per Week: Not on file  . Minutes of Exercise per Session: Not on file  Stress:   . Feeling of Stress : Not on file  Social Connections:   . Frequency of Communication with Friends and Family: Not on file  . Frequency of Social Gatherings with Friends and Family: Not on file  . Attends Religious Services: Not on file  . Active Member of Clubs or Organizations: Not on file  . Attends Archivist Meetings: Not on file  . Marital Status: Not on file  Intimate Partner Violence:   . Fear of Current or Ex-Partner: Not on file  . Emotionally Abused: Not on file  . Physically Abused: Not on file  . Sexually Abused: Not on file    Past Medical History, Surgical history,  Social history, and Family history were reviewed and updated as appropriate.   Please see review of systems for further details on the patient's review from today.   Review of Systems:  Review of Systems  Constitutional: Negative for activity change, appetite change, chills, diaphoresis and fever.  HENT: Negative for trouble swallowing.   Respiratory: Negative for cough, choking, chest tightness, shortness of breath and wheezing.   Cardiovascular: Negative for chest pain, palpitations and leg swelling.  Gastrointestinal: Positive for abdominal pain, nausea and vomiting. Negative for abdominal distention, constipation and diarrhea.  Genitourinary: Negative for decreased urine volume.  Neurological: Negative for headaches.    Objective:   Physical Exam:  BP (!) 195/58 (BP Location: Left Arm, Patient Position: Sitting)   Pulse 64   Temp 97.8 F (36.6 C) (Tympanic)   Resp 18   Ht 5' 2"  (1.575 m)   SpO2 100%   BMI 27.07 kg/m  ECOG: 2  Physical Exam Constitutional:      General: She is not in acute distress.    Appearance: She is not toxic-appearing or diaphoretic.  HENT:     Head: Normocephalic and atraumatic.  Eyes:     General: No scleral icterus.       Right eye: No discharge.        Left eye: No discharge.     Conjunctiva/sclera: Conjunctivae normal.  Neurological:     Gait: Gait abnormal (The patient is ambulating with a wheelchair.).  Psychiatric:        Mood and Affect: Mood normal.        Behavior: Behavior normal.        Thought Content: Thought content normal.        Judgment: Judgment normal.     Lab Review:     Component Value Date/Time   NA 139 01/11/2020 0549   NA 138 12/03/2018 1604   NA 140 01/29/2017 1106   K 3.7 01/11/2020 0549   K 4.1 01/29/2017 1106   CL 100 01/11/2020 0549   CO2 21 (L) 01/11/2020 0549   CO2 29 01/29/2017 1106   GLUCOSE 165 (H) 01/11/2020 0549   GLUCOSE 144 (H) 01/29/2017 1106  BUN 11 01/11/2020 0549   BUN 18 12/03/2018  1604   BUN 15.4 01/29/2017 1106   CREATININE 0.70 01/11/2020 0549   CREATININE 0.65 01/06/2020 1328   CREATININE 0.8 01/29/2017 1106   CALCIUM 9.5 01/11/2020 0549   CALCIUM 9.8 01/29/2017 1106   PROT 7.4 01/11/2020 0549   PROT 6.8 07/22/2017 1055   PROT 7.5 01/29/2017 1106   ALBUMIN 3.6 01/11/2020 0549   ALBUMIN 3.7 07/22/2017 1055   ALBUMIN 3.3 (L) 01/29/2017 1106   AST 41 01/11/2020 0549   AST 39 01/06/2020 1328   AST 22 01/29/2017 1106   ALT 25 01/11/2020 0549   ALT 22 01/06/2020 1328   ALT 19 01/29/2017 1106   ALKPHOS 97 01/11/2020 0549   ALKPHOS 109 01/29/2017 1106   BILITOT 1.4 (H) 01/11/2020 0549   BILITOT 0.7 01/06/2020 1328   BILITOT 0.28 01/29/2017 1106   GFRNONAA >60 01/11/2020 0549   GFRNONAA >60 01/06/2020 1328   GFRNONAA >89 06/19/2016 1432   GFRAA >60 01/11/2020 0549   GFRAA >60 01/06/2020 1328   GFRAA >89 06/19/2016 1432       Component Value Date/Time   WBC 6.2 01/11/2020 0549   RBC 4.74 01/11/2020 0549   HGB 13.4 01/11/2020 0549   HGB 12.3 01/06/2020 1328   HGB 12.4 07/22/2017 1055   HGB 12.0 01/29/2017 1106   HCT 41.7 01/11/2020 0549   HCT 38.5 07/22/2017 1055   HCT 38.6 01/29/2017 1106   PLT 133 (L) 01/11/2020 0549   PLT 107 (L) 01/06/2020 1328   PLT 195 07/22/2017 1055   MCV 88.0 01/11/2020 0549   MCV 88 07/22/2017 1055   MCV 93.0 01/29/2017 1106   MCH 28.3 01/11/2020 0549   MCHC 32.1 01/11/2020 0549   RDW 15.3 01/11/2020 0549   RDW 14.9 07/22/2017 1055   RDW 14.0 01/29/2017 1106   LYMPHSABS 1.0 01/11/2020 0549   LYMPHSABS 2.2 07/22/2017 1055   LYMPHSABS 2.2 01/29/2017 1106   MONOABS 0.4 01/11/2020 0549   MONOABS 0.7 01/29/2017 1106   EOSABS 0.0 01/11/2020 0549   EOSABS 0.1 07/22/2017 1055   BASOSABS 0.0 01/11/2020 0549   BASOSABS 0.0 07/22/2017 1055   BASOSABS 0.0 01/29/2017 1106   -------------------------------  Imaging from last 24 hours (if applicable):  Radiology interpretation: CT ABDOMEN PELVIS W CONTRAST  Result  Date: 01/11/2020 CLINICAL DATA:  74 year old female with history of abdominal distension and abdominal pain. History of cholangiocarcinoma treated with prior surgical resection, chemotherapy and radiation therapy. EXAM: CT ABDOMEN AND PELVIS WITH CONTRAST TECHNIQUE: Multidetector CT imaging of the abdomen and pelvis was performed using the standard protocol following bolus administration of intravenous contrast. CONTRAST:  131m OMNIPAQUE IOHEXOL 300 MG/ML  SOLN COMPARISON:  CT the abdomen and pelvis 08/17/2019. FINDINGS: Lower chest: Lipomatous hypertrophy of the interatrial septum incidentally noted. Mild linear scarring or atelectasis in the right middle lobe and inferior segment of the lingula. Hepatobiliary: Status post partial hepatectomy with resection of the left lobe of the liver. Within the remaining right lobe of the liver there are numerous lesions range from predominantly hypovascular to predominantly hypervascular in appearance. These appear increased in number and size compared to the prior study from 08/17/2019. The largest hypervascular lesion or conglomeration of lesions is in the inferior aspect of the right lobe of the liver (axial image 34 of series 2) measuring 7.4 x 4.3 cm. The largest hypovascular lesion or conglomeration of hypovascular lesions is in the superior aspect of the right lobe of the liver (  axial image 12 of series 2) measuring approximately 7.6 x 6.0 cm. Gallbladder is surgically absent. Pancreas: No pancreatic mass. No pancreatic ductal dilatation. No pancreatic or peripancreatic fluid collections or inflammatory changes. Spleen: Unremarkable. Adrenals/Urinary Tract: Small cortical calcification in the interpolar region of the right kidney. Left kidney and bilateral adrenal glands are normal in appearance. No hydroureteronephrosis. Urinary bladder is normal in appearance. Stomach/Bowel: Profound mural thickening in the distal stomach, predominantly involving the antrum, somewhat  mass-like in appearance, best appreciated on axial image 16 of series 2. This thickening extends to the pyloric region as well as the duodenal bulb. These findings may simply be sequela of prior radiation therapy, however, underlying mass is difficult to exclude given the appearance. No pathologic dilatation of small bowel or colon. There are 2 fatty attenuation lesions noted within the colon. The smaller of these lesions is in the region of the splenic flexure measuring 1.3 x 0.8 cm (axial image 27 of series 2). The larger lesion is in the region of the sigmoid colon measuring approximately 4.6 x 2.4 x 1.9 cm, where the lesion appears pedunculated and extending into the sigmoid colon without frank intussusception at this time. Normal appendix. Vascular/Lymphatic: Aortic atherosclerosis, without evidence of aneurysm or dissection in the abdominal or pelvic vasculature. There is extensive amorphous soft tissue in the upper abdomen which is intimately associated with the suprarenal abdominal aorta, celiac axis, proximal common hepatic artery and superior mesenteric arteries, likely to represent malignant nodal tissue, markedly increased compared to the prior study. The bulkiest portion of this soft tissues well demonstrated on axial image 14 of series 2 where this is estimated to measure approximately 6.2 x 2.7 cm. Multiple other borderline enlarged and enlarged retroperitoneal lymph nodes are also noted measuring up to 1.1 cm in short axis in the left para-aortic nodal station (axial image 29 of series 2). No definite pelvic lymphadenopathy. Reproductive: Status post hysterectomy. Ovaries are not confidently identified may be surgically absent or atrophic. Other: Trace volume of ascites.  No pneumoperitoneum. Musculoskeletal: There are no aggressive appearing lytic or blastic lesions noted in the visualized portions of the skeleton. IMPRESSION: 1. Today's study demonstrates progression of disease with increased  number and size of numerous metastatic lesions within the liver, as well as progressive lymphadenopathy in the upper abdomen and retroperitoneum. 2. Mass-like thickening throughout the distal stomach and proximal duodenum, favored to be sequela of prior radiation therapy, although an underlying mass is difficult to entirely exclude. 3. Lipomatous polyps noted in the colon, largest of which is a pedunculated lesion which extends over a significant length of the lumen in the sigmoid colon, without frank intussusception at this time. 4. Additional incidental findings, as above. Electronically Signed   By: Vinnie Langton M.D.   On: 01/11/2020 08:22        This patient was seen with Dr. Burr Medico with my treatment plan reviewed with her. She expressed agreement with my medical management of this patient.  Addendum  I have seen the patient, examined her. I agree with the assessment and and plan and have edited the notes.   Unfortunately Bonnie Peterson has became very symptomatic from her rapid cancer progression.  I personally reviewed her recent CT images which was obtained during her ED visit from yesterday, and I discussed with patient and her family members.  She is not a candidate for further cancer treatment.  Her anticipated life expectancy will be likely a few weeks to a few months.  We discussed  symptom management in details.  I recommend home hospice care, both patient and her family members (I met 8 of her children and grandchildren) are in agreement.  We discussed the hospice service in detail.  Patient and her family members have agreed to DNR.  Referral will be made today.  I will remain to be her provider when she under hospice.  I encouraged patient and her family to call me if they have any concerns.  They expressed good understanding and appreciated the care from [place]. Pt received IV fluids, antiemetics and pain medication today in our clinic.  Truitt Merle  01/12/2020

## 2020-01-12 NOTE — Telephone Encounter (Signed)
R/s appt per 10/5 sch msg - pt aware of appt date and time

## 2020-01-13 ENCOUNTER — Inpatient Hospital Stay: Payer: Medicare HMO

## 2020-01-13 ENCOUNTER — Inpatient Hospital Stay: Payer: Medicare HMO | Admitting: Hematology

## 2020-01-14 ENCOUNTER — Emergency Department (HOSPITAL_COMMUNITY)

## 2020-01-14 ENCOUNTER — Ambulatory Visit (HOSPITAL_COMMUNITY): Payer: Medicare HMO

## 2020-01-14 ENCOUNTER — Other Ambulatory Visit: Payer: Self-pay

## 2020-01-14 ENCOUNTER — Inpatient Hospital Stay (HOSPITAL_COMMUNITY)
Admission: EM | Admit: 2020-01-14 | Discharge: 2020-01-15 | DRG: 951 | Disposition: A | Discharge status: Home / Self Care | Attending: Internal Medicine | Admitting: Internal Medicine

## 2020-01-14 ENCOUNTER — Encounter (HOSPITAL_COMMUNITY): Payer: Self-pay

## 2020-01-14 DIAGNOSIS — E785 Hyperlipidemia, unspecified: Secondary | ICD-10-CM | POA: Diagnosis present

## 2020-01-14 DIAGNOSIS — Z515 Encounter for palliative care: Secondary | ICD-10-CM | POA: Diagnosis present

## 2020-01-14 DIAGNOSIS — F411 Generalized anxiety disorder: Secondary | ICD-10-CM | POA: Diagnosis present

## 2020-01-14 DIAGNOSIS — J449 Chronic obstructive pulmonary disease, unspecified: Secondary | ICD-10-CM | POA: Diagnosis present

## 2020-01-14 DIAGNOSIS — Z8249 Family history of ischemic heart disease and other diseases of the circulatory system: Secondary | ICD-10-CM | POA: Diagnosis not present

## 2020-01-14 DIAGNOSIS — E876 Hypokalemia: Secondary | ICD-10-CM | POA: Diagnosis present

## 2020-01-14 DIAGNOSIS — M109 Gout, unspecified: Secondary | ICD-10-CM | POA: Diagnosis present

## 2020-01-14 DIAGNOSIS — Z20822 Contact with and (suspected) exposure to covid-19: Secondary | ICD-10-CM | POA: Diagnosis present

## 2020-01-14 DIAGNOSIS — R112 Nausea with vomiting, unspecified: Secondary | ICD-10-CM | POA: Diagnosis present

## 2020-01-14 DIAGNOSIS — E1136 Type 2 diabetes mellitus with diabetic cataract: Secondary | ICD-10-CM | POA: Diagnosis present

## 2020-01-14 DIAGNOSIS — Z7984 Long term (current) use of oral hypoglycemic drugs: Secondary | ICD-10-CM

## 2020-01-14 DIAGNOSIS — Z7901 Long term (current) use of anticoagulants: Secondary | ICD-10-CM

## 2020-01-14 DIAGNOSIS — E559 Vitamin D deficiency, unspecified: Secondary | ICD-10-CM | POA: Diagnosis present

## 2020-01-14 DIAGNOSIS — F32A Depression, unspecified: Secondary | ICD-10-CM | POA: Diagnosis present

## 2020-01-14 DIAGNOSIS — C799 Secondary malignant neoplasm of unspecified site: Secondary | ICD-10-CM | POA: Diagnosis present

## 2020-01-14 DIAGNOSIS — I272 Pulmonary hypertension, unspecified: Secondary | ICD-10-CM | POA: Diagnosis present

## 2020-01-14 DIAGNOSIS — I10 Essential (primary) hypertension: Secondary | ICD-10-CM | POA: Diagnosis present

## 2020-01-14 DIAGNOSIS — G4733 Obstructive sleep apnea (adult) (pediatric): Secondary | ICD-10-CM | POA: Diagnosis present

## 2020-01-14 DIAGNOSIS — Z833 Family history of diabetes mellitus: Secondary | ICD-10-CM | POA: Diagnosis not present

## 2020-01-14 DIAGNOSIS — K219 Gastro-esophageal reflux disease without esophagitis: Secondary | ICD-10-CM | POA: Diagnosis present

## 2020-01-14 DIAGNOSIS — K589 Irritable bowel syndrome without diarrhea: Secondary | ICD-10-CM | POA: Diagnosis present

## 2020-01-14 DIAGNOSIS — E860 Dehydration: Secondary | ICD-10-CM | POA: Diagnosis not present

## 2020-01-14 DIAGNOSIS — Z884 Allergy status to anesthetic agent status: Secondary | ICD-10-CM

## 2020-01-14 DIAGNOSIS — M1990 Unspecified osteoarthritis, unspecified site: Secondary | ICD-10-CM | POA: Diagnosis present

## 2020-01-14 DIAGNOSIS — Z87891 Personal history of nicotine dependence: Secondary | ICD-10-CM | POA: Diagnosis not present

## 2020-01-14 DIAGNOSIS — Z7189 Other specified counseling: Secondary | ICD-10-CM | POA: Diagnosis not present

## 2020-01-14 DIAGNOSIS — Z79899 Other long term (current) drug therapy: Secondary | ICD-10-CM

## 2020-01-14 DIAGNOSIS — C221 Intrahepatic bile duct carcinoma: Secondary | ICD-10-CM | POA: Diagnosis present

## 2020-01-14 LAB — CBC WITH DIFFERENTIAL/PLATELET
Abs Immature Granulocytes: 0.01 10*3/uL (ref 0.00–0.07)
Basophils Absolute: 0 10*3/uL (ref 0.0–0.1)
Basophils Relative: 0 %
Eosinophils Absolute: 0 10*3/uL (ref 0.0–0.5)
Eosinophils Relative: 1 %
HCT: 41.1 % (ref 36.0–46.0)
Hemoglobin: 13.1 g/dL (ref 12.0–15.0)
Immature Granulocytes: 0 %
Lymphocytes Relative: 22 %
Lymphs Abs: 1.5 10*3/uL (ref 0.7–4.0)
MCH: 28.5 pg (ref 26.0–34.0)
MCHC: 31.9 g/dL (ref 30.0–36.0)
MCV: 89.5 fL (ref 80.0–100.0)
Monocytes Absolute: 0.6 10*3/uL (ref 0.1–1.0)
Monocytes Relative: 9 %
Neutro Abs: 4.6 10*3/uL (ref 1.7–7.7)
Neutrophils Relative %: 68 %
Platelets: 155 10*3/uL (ref 150–400)
RBC: 4.59 MIL/uL (ref 3.87–5.11)
RDW: 15.7 % — ABNORMAL HIGH (ref 11.5–15.5)
WBC: 6.7 10*3/uL (ref 4.0–10.5)
nRBC: 0 % (ref 0.0–0.2)

## 2020-01-14 LAB — COMPREHENSIVE METABOLIC PANEL
ALT: 30 U/L (ref 0–44)
AST: 46 U/L — ABNORMAL HIGH (ref 15–41)
Albumin: 3.5 g/dL (ref 3.5–5.0)
Alkaline Phosphatase: 97 U/L (ref 38–126)
Anion gap: 17 — ABNORMAL HIGH (ref 5–15)
BUN: 21 mg/dL (ref 8–23)
CO2: 26 mmol/L (ref 22–32)
Calcium: 9.7 mg/dL (ref 8.9–10.3)
Chloride: 97 mmol/L — ABNORMAL LOW (ref 98–111)
Creatinine, Ser: 0.77 mg/dL (ref 0.44–1.00)
GFR calc non Af Amer: >60 mL/min (ref >60)
Glucose, Bld: 129 mg/dL — ABNORMAL HIGH (ref 70–99)
Potassium: 3.1 mmol/L — ABNORMAL LOW (ref 3.5–5.1)
Sodium: 140 mmol/L (ref 135–145)
Total Bilirubin: 1.4 mg/dL — ABNORMAL HIGH (ref 0.3–1.2)
Total Protein: 7.3 g/dL (ref 6.5–8.1)

## 2020-01-14 LAB — RESPIRATORY PANEL BY RT PCR (FLU A&B, COVID)
Influenza A by PCR: NEGATIVE
Influenza B by PCR: NEGATIVE
SARS Coronavirus 2 by RT PCR: NEGATIVE

## 2020-01-14 LAB — GLUCOSE, CAPILLARY
Glucose-Capillary: 61 mg/dL — ABNORMAL LOW (ref 70–99)
Glucose-Capillary: 74 mg/dL (ref 70–99)

## 2020-01-14 MED ORDER — ONDANSETRON HCL 4 MG/2ML IJ SOLN
4.0000 mg | Freq: Four times a day (QID) | INTRAMUSCULAR | Status: DC | PRN
  Administered 2020-01-15: 4 mg via INTRAVENOUS
  Filled 2020-01-14: qty 2

## 2020-01-14 MED ORDER — SODIUM CHLORIDE 0.9 % IV SOLN: INTRAVENOUS | Status: DC

## 2020-01-14 MED ORDER — POTASSIUM CHLORIDE 10 MEQ/100ML IV SOLN
10.0000 meq | Freq: Once | INTRAVENOUS | Status: AC
  Administered 2020-01-14: 10 meq via INTRAVENOUS
  Filled 2020-01-14: qty 100

## 2020-01-14 MED ORDER — LORAZEPAM 2 MG/ML IJ SOLN
0.5000 mg | Freq: Once | INTRAMUSCULAR | Status: AC
  Administered 2020-01-14: 0.5 mg via INTRAVENOUS
  Filled 2020-01-14: qty 1

## 2020-01-14 MED ORDER — MORPHINE SULFATE (PF) 2 MG/ML IV SOLN
2.0000 mg | INTRAVENOUS | Status: DC | PRN
  Administered 2020-01-15: 2 mg via INTRAVENOUS
  Filled 2020-01-14: qty 1

## 2020-01-14 MED ORDER — METOCLOPRAMIDE HCL 5 MG/ML IJ SOLN
5.0000 mg | Freq: Once | INTRAMUSCULAR | Status: AC
  Administered 2020-01-14: 5 mg via INTRAVENOUS
  Filled 2020-01-14: qty 2

## 2020-01-14 MED ORDER — POTASSIUM CHLORIDE 10 MEQ/100ML IV SOLN
10.0000 meq | INTRAVENOUS | Status: AC
  Administered 2020-01-14 (×3): 10 meq via INTRAVENOUS
  Filled 2020-01-14 (×3): qty 100

## 2020-01-14 MED ORDER — SODIUM CHLORIDE 0.9 % IV BOLUS
1000.0000 mL | Freq: Once | INTRAVENOUS | Status: AC
  Administered 2020-01-14: 1000 mL via INTRAVENOUS

## 2020-01-14 MED ORDER — ACETAMINOPHEN 650 MG RE SUPP: 650.0000 mg | Freq: Four times a day (QID) | RECTAL | Status: DC | PRN

## 2020-01-14 MED ORDER — ENOXAPARIN SODIUM 40 MG/0.4ML ~~LOC~~ SOLN
40.0000 mg | SUBCUTANEOUS | Status: DC
  Administered 2020-01-14: 40 mg via SUBCUTANEOUS
  Filled 2020-01-14: qty 0.4

## 2020-01-14 MED ORDER — ONDANSETRON HCL 4 MG PO TABS
4.0000 mg | ORAL_TABLET | Freq: Four times a day (QID) | ORAL | Status: DC | PRN
  Administered 2020-01-15: 4 mg via ORAL
  Filled 2020-01-14: qty 1

## 2020-01-14 MED ORDER — INSULIN ASPART 100 UNIT/ML ~~LOC~~ SOLN: 0.0000 [IU] | Freq: Three times a day (TID) | SUBCUTANEOUS | Status: DC

## 2020-01-14 MED ORDER — ACETAMINOPHEN 325 MG PO TABS: 650.0000 mg | ORAL_TABLET | Freq: Four times a day (QID) | ORAL | Status: DC | PRN

## 2020-01-14 NOTE — ED Notes (Signed)
Called Chief Strategy Officer care [facility])

## 2020-01-14 NOTE — ED Provider Notes (Signed)
[facility] DEPT Provider Note   CSN: [id number] Arrival date & time: 01/14/20  1021     History Chief Complaint  Patient presents with  . Back Pain  . Nausea  . Dizziness  . Emesis    Bonnie Peterson is a 74 y.o. female.  74 year old female with history of end-stage liver cancer who has just had a hospice presents with persistent nausea vomiting times several days. Seen at cancer center for similar symptoms a few days ago was medicated with Decadron as well as antiemetics. Given IV fluids at the time as well 2. States that she was prescribed Zofran and that she continues to vomit and have abdominal discomfort. No fever or chills. No diarrhea.        Past Medical History:  Diagnosis Date  . Anxiety   . Benign positional vertigo   . Cancer ([place]) 02/09/15   intrahepatic cholangiocarcinoma  . COPD (chronic obstructive pulmonary disease) ([place])   . Depression   . Diabetes mellitus without complication ([place])   . Early cataracts, bilateral 10/13   Optho, Dr Gershon Crane  . Echocardiogram findings abnormal, without diagnosis 10/10   10/10: mild pulm HTN, EF 60-65%, mild LVH, moderate aortic regurg  . GERD (gastroesophageal reflux disease)    I have "acid reflux"  . Gout   . HLD (hyperlipidemia)   . HTN, goal below 130/80   . Irritable bowel syndrome   . Obesity, Class III, BMI 40-49.9 (morbid obesity) ([place])   . Obstructive sleep apnea    wears cpap  . Osteoarthritis (arthritis due to wear and tear of joints)    also gout  . Restrictive lung disease     Patient Active Problem List   Diagnosis Date Noted  . Rectal bleeding   . Depression 07/19/2019  . Goals of care, counseling/discussion 06/11/2019  . Blood in stool 06/11/2019  . Acute pain of left lower extremity 01/05/2019  . Gastroesophageal reflux disease without esophagitis 06/03/2018  . Lung nodules 05/04/2018  . Type 2 diabetes mellitus without complication, without long-term current  use of insulin ([place]) 01/05/2017  . Healthcare maintenance 01/24/2016  . Back pain 11/23/2015  . Port catheter in place 11/10/2015  . 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 History of resection of liver 02/10/2015  . 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 Inadequate sleep hygiene 08/22/2014  . 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 IBS (irritable bowel syndrome) 03/22/2013  . Liver mass, left lobe 02/24/2013  . Osteoporosis 10/15/2012  . Vitamin D deficiency 12/23/2011  . Cigarette smoker 10/12/2010  . Chronic obstructive pulmonary disease ([place]) 06/14/2010  . Obstructive sleep apnea 04/27/2009  . OBESITY, UNSPECIFIED 04/26/2009  . Hyperlipidemia 11/25/2007  . GOUT NOS 12/24/2006  . Recurrent major depressive disorder ([place]) 06/05/2006  . Anxiety 06/05/2006  . Tobacco use 06/05/2006  . HYPERTENSION, BENIGN SYSTEMIC 06/05/2006  . Osteoarthritis 06/05/2006  . Generalized anxiety disorder 06/05/2006    Past Surgical History:  Procedure Laterality Date  . ABDOMINAL HYSTERECTOMY    . COLONOSCOPY WITH PROPOFOL N/A 08/12/2019   Procedure: COLONOSCOPY WITH PROPOFOL;  Surgeon: [name], MD;  Location: [facility] ENDOSCOPY;  Service: Endoscopy;  Laterality: N/A;  . ESOPHAGOGASTRODUODENOSCOPY N/A 06/15/2015   Procedure: ESOPHAGOGASTRODUODENOSCOPY (EGD);  Surgeon: Gatha Mayer, MD;  Location: [facility];  Service: Endoscopy;  Laterality: N/A;  . IR IMAGING GUIDED PORT INSERTION  06/15/2019  . IR REMOVAL TUN ACCESS W/ PORT W/O  FL MOD SED  11/11/2016  . LIVER BIOPSY    . OPEN PARTIAL HEPATECTOMY  Left 02/09/15  . PARTIAL HYSTERECTOMY    . ROTATOR CUFF REPAIR       L rotator cuff repair 11/07-[place] - 12/7/200  . TONSILLECTOMY       OB History   No obstetric history on file.     Family History  Problem Relation Age of Onset  . Breast cancer Mother   . Hypertension Mother   . Coronary artery disease Mother     . Diabetes type II Mother   . Rheum arthritis Mother   . Diabetes type II Sister   . Pancreatic cancer Sister     Social History   Tobacco Use  . Smoking status: Former Smoker    Packs/day: 0.50    Years: 49.00    Pack years: 24.50    Types: Cigarettes    Quit date: 04/13/2018    Years since quitting: 1.7  . Smokeless tobacco: Never Used  Vaping Use  . Vaping Use: Never used  Substance Use Topics  . Alcohol use: Yes    Alcohol/week: 0.0 standard drinks    Comment: occasionally  . Drug use: Yes    Types: Marijuana    Home Medications Prior to Admission medications   Medication Sig Start Date End Date Taking? Authorizing Provider  albuterol (PROAIR HFA) 108 (90 Base) MCG/ACT inhaler Inhale 1-2 puffs into the lungs every 6 (six) hours as needed for wheezing or shortness of breath. 07/23/19   Parrett, Fonnie Mu, NP  alendronate (FOSAMAX) 70 MG tablet TAKE 1 TABLET  ONCE A WEEK. Patient taking differently: Take 70 mg by mouth every Sunday. TAKE 1 TABLET  ONCE A WEEK. 03/02/19   Lattie Haw, MD  allopurinol (ZYLOPRIM) 100 MG tablet Take 100 mg by mouth daily. 08/04/19   [provider]  amLODipine (NORVASC) 10 MG tablet TAKE 1 TABLET EVERY DAY Patient taking differently: Take 10 mg by mouth daily.  06/03/19   Lattie Haw, MD  atenolol (TENORMIN) 25 MG tablet Take 1 tablet (25 mg total) by mouth daily. 01/08/19   Lattie Haw, MD  cholecalciferol (VITAMIN D) 1000 units tablet Take 1 tablet (1,000 Units total) by mouth daily. 01/03/17   Bufford Lope, DO  diclofenac sodium (VOLTAREN) 1 % GEL Apply 2 g topically 4 (four) times daily as needed. 01/26/18   Bufford Lope, DO  dicyclomine (BENTYL) 20 MG tablet Take 1 tablet (20 mg total) by mouth 2 (two) times daily as needed for spasms (abdominal pain). 01/11/20   Gareth Morgan, MD  famotidine (PEPCID) 20 MG tablet Take 1 tablet (20 mg total) by mouth 2 (two) times daily. 06/26/19   Lattie Haw, MD  fish oil-omega-3 fatty acids  1000 MG capsule Take 1 g by mouth every 30 (thirty) days.     [provider]  fluticasone (FLONASE) 50 MCG/ACT nasal spray Place 2 sprays into both nostrils daily as needed for rhinitis. 04/17/16   Haney, Yetta Flock A, MD  hydrochlorothiazide (HYDRODIURIL) 25 MG tablet Take 1 tablet (25 mg total) by mouth daily. 03/02/19   Lattie Haw, MD  Hyprom-Naphaz-Polysorb-Zn Sulf (CLEAR EYES COMPLETE OP) Place 1 drop into both eyes daily as needed (dry eyes).     [provider]  lidocaine-prilocaine (EMLA) cream Apply 1 application topically every 14 (fourteen) days. Every 14 days as needed Patient taking differently: Apply 1 application topically as needed (port access).  06/17/19   Burr Medico,  Krista Blue, MD  lisinopril (ZESTRIL) 40 MG tablet Take 1 tablet (40 mg total) by mouth daily. 03/02/19   Lattie Haw, MD  loratadine (CLARITIN) 10 MG tablet Take 1 tablet (10 mg total) by mouth daily as needed for allergies. 09/23/12   Waldemar Dickens, MD  LORazepam (ATIVAN) 0.5 MG tablet Take 0.5-1 tablets (0.25-0.5 mg total) by mouth at bedtime as needed for anxiety. Patient taking differently: Take 0.5 mg by mouth at bedtime as needed for anxiety.  07/06/19   Alla Feeling, NP  metFORMIN (GLUCOPHAGE) 500 MG tablet TAKE 2 TABLETS TWICE DAILY WITH MEALS Patient taking differently: Take 1,000 mg by mouth 2 (two) times daily with a meal. TAKE 2 TABLETS TWICE DAILY WITH MEALS 03/02/19   Lattie Haw, MD  metoCLOPramide (REGLAN) 10 MG tablet Take 1 tablet (10 mg total) by mouth every 6 (six) hours as needed for nausea or vomiting. 01/11/20   Gareth Morgan, MD  mirtazapine (REMERON SOL-TAB) 15 MG disintegrating tablet Take 1 tablet (15 mg total) by mouth at bedtime. 01/06/20 01/05/21  Truitt Merle, MD  Multiple Vitamin (MULTIVITAMIN WITH MINERALS) TABS tablet Take 1 tablet by mouth daily.    [provider]  Na Sulfate-K Sulfate-Mg Sulf (SUPREP BOWEL PREP KIT) 17.5-3.13-1.6 GM/177ML SOLN Take 1 kit by mouth as  directed. 07/21/19   [name], MD  ondansetron (ZOFRAN ODT) 8 MG disintegrating tablet Take 1 tablet (8 mg total) by mouth every 8 (eight) hours as needed for nausea or vomiting. 01/12/20   Tanner, Lyndon Code., PA-C  ondansetron (ZOFRAN) 4 MG tablet Take 4 mg by mouth every 8 (eight) hours as needed for nausea or vomiting.    [provider]  oxyCODONE (OXY IR/ROXICODONE) 5 MG immediate release tablet Take 0.5-1 tablets (2.5-5 mg total) by mouth every 8 (eight) hours as needed for severe pain. 01/06/20   Truitt Merle, MD  polyvinyl alcohol (LIQUIFILM TEARS) 1.4 % ophthalmic solution Place 1 drop into both eyes as needed for dry eyes.    [provider]  potassium chloride (KLOR-CON) 10 MEQ tablet Take 1 tablet (10 mEq total) by mouth daily. 01/10/20   Truitt Merle, MD  Probiotic Product (PROBIOTIC PO) Take 1 capsule by mouth daily.    [provider]  prochlorperazine (COMPAZINE) 10 MG tablet Take 10 mg by mouth every 6 (six) hours as needed for nausea or vomiting.    [provider]  RIVAROXABAN Alveda Reasons) VTE STARTER PACK (15 & 20 MG TABLETS) Follow package directions: Take one 33m tablet by mouth twice a day. On day 22, switch to one 231mtablet once a day. Take with food. 08/26/19   FeTruitt MerleMD  traMADol (ULTRAM) 50 MG tablet Take 1 tablet (50 mg total) by mouth every 6 (six) hours as needed. 12/20/19   FeTruitt MerleMD  VITAMIN E PO Take 1 tablet by mouth See admin instructions. 2-3 times a month    [provider]    Allergies    Other and Propofol  Review of Systems   Review of Systems  All other systems reviewed and are negative.   Physical Exam Updated Vital Signs BP (!) 152/123 (BP Location: Left Arm)   Pulse 73   Temp 97.7 F (36.5 C) (Oral)   Resp (!) 26   Ht 1.6 m (5' 3" )   Wt 63.5 kg   SpO2 99%   BMI 24.80 kg/m   Physical Exam Vitals and nursing note reviewed.  Constitutional:  General: She is not in acute distress.     Appearance: Normal appearance. She is well-developed. She is not toxic-appearing.  HENT:     Head: Normocephalic and atraumatic.  Eyes:     General: Lids are normal.     Conjunctiva/sclera: Conjunctivae normal.     Pupils: Pupils are equal, round, and reactive to light.  Neck:     Thyroid: No thyroid mass.     Trachea: No tracheal deviation.  Cardiovascular:     Rate and Rhythm: Normal rate and regular rhythm.     Heart sounds: Normal heart sounds. No murmur heard.  No gallop.   Pulmonary:     Effort: Pulmonary effort is normal. No respiratory distress.     Breath sounds: Normal breath sounds. No stridor. No decreased breath sounds, wheezing, rhonchi or rales.  Abdominal:     General: Bowel sounds are normal. There is no distension.     Palpations: Abdomen is soft.     Tenderness: There is generalized abdominal tenderness. There is guarding. There is no rebound.  Musculoskeletal:        General: No tenderness. Normal range of motion.     Cervical back: Normal range of motion and neck supple.  Skin:    General: Skin is warm and dry.     Findings: No abrasion or rash.  Neurological:     Mental Status: She is alert and oriented to person, place, and time.     GCS: GCS eye subscore is 4. GCS verbal subscore is 5. GCS motor subscore is 6.     Cranial Nerves: No cranial nerve deficit.     Sensory: No sensory deficit.  Psychiatric:        Attention and Perception: Attention normal.        Mood and Affect: Affect is flat.     ED Results / Procedures / Treatments   Labs (all labs ordered are listed, but only abnormal results are displayed) Labs Reviewed  RESPIRATORY PANEL BY RT PCR (FLU A&B, COVID)  CBC WITH DIFFERENTIAL/PLATELET  COMPREHENSIVE METABOLIC PANEL    EKG None  Radiology No results found.  Procedures Procedures (including critical care time)  Medications Ordered in ED Medications  sodium chloride 0.9 % bolus 1,000 mL (has no administration in time range)    0.9 %  sodium chloride infusion (has no administration in time range)  metoCLOPramide (REGLAN) injection 5 mg (has no administration in time range)  LORazepam (ATIVAN) injection 0.5 mg (has no administration in time range)    ED Course  I have reviewed the triage vital signs and the nursing notes.  Pertinent labs & imaging results that were available during my care of the patient were reviewed by me and considered in my medical decision making (see chart for details).    MDM Rules/Calculators/A&P                          Patient with mild hypokalemia will treat with IV potassium.  She was in severe pain here and having emesis.  Treated with antiemetics and pain medication is resting company at this time.  Acute abdominal series and abdominal CT without evidence of obstruction.  Long discussion with family as well as with hospice nurse.  Hospice has just established care with this patient and family is very frustrated that they are not able to take care of the patient at home.  Patient has been able to keep down liquids  for several days.  Will consult hospitalist for admission Final Clinical Impression(s) / ED Diagnoses Final diagnoses:  None    Rx / DC Orders ED Discharge Orders    None       Lacretia Leigh, MD 01/14/20 1422

## 2020-01-14 NOTE — H&P (Signed)
History and Physical    [name] FYB:[id number] DOB: [birth year] DOA: 01/14/2020  PCP: Lattie Haw, MD  Patient coming from: Home  Chief Complaint: Intractable N/V  HPI: Bonnie Peterson is a 74 y.o. female with medical history significant of metastatic cholangiocarcinoma. Presenting from home with N/V over the last several days. Recently seen in ED for ab pain and oncology clinic for N/V. Recent imaging showed progression of disease to stage 4 and patient/family made decision to pursue home hospice. Patient was given anti-emetics for home, but has not been able to tolerate taking the medications. Thus, her nausea has persisted and she has been unable to keep any solids or liquids down. Her family became concerned and talked with their hospice nurse today who recommended return to the ED.   ED Course: She was given reglan and fluids. Her nausea improved. [facility] was called for admission.   Review of Systems:  She reports RUQ abdominal pain, N, V. Denies CP, palpitations, F, D. Review of systems is otherwise negative for all not mentioned in HPI.   PMHx Past Medical History:  Diagnosis Date  . Anxiety   . Benign positional vertigo   . Cancer ([place]) 02/09/15   intrahepatic cholangiocarcinoma  . COPD (chronic obstructive pulmonary disease) ([place])   . Depression   . Diabetes mellitus without complication ([place])   . Early cataracts, bilateral 10/13   Optho, Dr Gershon Crane  . Echocardiogram findings abnormal, without diagnosis 10/10   10/10: mild pulm HTN, EF 60-65%, mild LVH, moderate aortic regurg  . GERD (gastroesophageal reflux disease)    I have "acid reflux"  . Gout   . HLD (hyperlipidemia)   . HTN, goal below 130/80   . Irritable bowel syndrome   . Obesity, Class III, BMI 40-49.9 (morbid obesity) ([place])   . Obstructive sleep apnea    wears cpap  . Osteoarthritis (arthritis due to wear and tear of joints)    also gout  . Restrictive lung disease     PSHx Past Surgical History:    Procedure Laterality Date  . ABDOMINAL HYSTERECTOMY    . COLONOSCOPY WITH PROPOFOL N/A 08/12/2019   Procedure: COLONOSCOPY WITH PROPOFOL;  Surgeon: [name], MD;  Location: [facility];  Service: Endoscopy;  Laterality: N/A;  . ESOPHAGOGASTRODUODENOSCOPY N/A 06/15/2015   Procedure: ESOPHAGOGASTRODUODENOSCOPY (EGD);  Surgeon: Gatha Mayer, MD;  Location: [facility];  Service: Endoscopy;  Laterality: N/A;  . IR IMAGING GUIDED PORT INSERTION  06/15/2019  . IR REMOVAL TUN ACCESS W/ PORT W/O FL MOD SED  11/11/2016  . LIVER BIOPSY    . OPEN PARTIAL HEPATECTOMY  Left 02/09/15  . PARTIAL HYSTERECTOMY    . ROTATOR CUFF REPAIR       L rotator cuff repair 11/07-[place] - 12/7/200  . TONSILLECTOMY      Soc[facility]  reports that she quit smoking about 21 months ago. Her smoking use included cigarettes. She has a 24.50 pack-year smoking history. She has never used smokeless tobacco. She reports current alcohol use. She reports current drug use. Drug: Marijuana.  Allergies  Allergen Reactions  . Other     Propofol - Anesthesia for MRI--made her "breathe funny"  . Propofol Other (See Comments)    "It makes me feel like I'm on fire."    FamHx Family History  Problem Relation Age of Onset  . Breast cancer Mother   . Hypertension Mother   . Coronary artery disease Mother   . Diabetes type II Mother   .  Rheum arthritis Mother   . Diabetes type II Sister   . Pancreatic cancer Sister     Prior to Admission medications   Medication Sig Start Date End Date Taking? Authorizing Provider  albuterol (PROAIR HFA) 108 (90 Base) MCG/ACT inhaler Inhale 1-2 puffs into the lungs every 6 (six) hours as needed for wheezing or shortness of breath. 07/23/19   Parrett, Fonnie Mu, NP  alendronate (FOSAMAX) 70 MG tablet TAKE 1 TABLET  ONCE A WEEK. Patient taking differently: Take 70 mg by mouth every Sunday. TAKE 1 TABLET  ONCE A WEEK. 03/02/19   Lattie Haw, MD  allopurinol (ZYLOPRIM) 100 MG tablet Take 100 mg by  mouth daily. 08/04/19   [provider]  amLODipine (NORVASC) 10 MG tablet TAKE 1 TABLET EVERY DAY Patient taking differently: Take 10 mg by mouth daily.  06/03/19   Lattie Haw, MD  atenolol (TENORMIN) 25 MG tablet Take 1 tablet (25 mg total) by mouth daily. 01/08/19   Lattie Haw, MD  cholecalciferol (VITAMIN D) 1000 units tablet Take 1 tablet (1,000 Units total) by mouth daily. 01/03/17   Bufford Lope, DO  diclofenac sodium (VOLTAREN) 1 % GEL Apply 2 g topically 4 (four) times daily as needed. 01/26/18   Bufford Lope, DO  dicyclomine (BENTYL) 20 MG tablet Take 1 tablet (20 mg total) by mouth 2 (two) times daily as needed for spasms (abdominal pain). 01/11/20   Gareth Morgan, MD  famotidine (PEPCID) 20 MG tablet Take 1 tablet (20 mg total) by mouth 2 (two) times daily. 06/26/19   Lattie Haw, MD  fish oil-omega-3 fatty acids 1000 MG capsule Take 1 g by mouth every 30 (thirty) days.     [provider]  fluticasone (FLONASE) 50 MCG/ACT nasal spray Place 2 sprays into both nostrils daily as needed for rhinitis. 04/17/16   Haney, Yetta Flock A, MD  hydrochlorothiazide (HYDRODIURIL) 25 MG tablet Take 1 tablet (25 mg total) by mouth daily. 03/02/19   Lattie Haw, MD  Hyprom-Naphaz-Polysorb-Zn Sulf (CLEAR EYES COMPLETE OP) Place 1 drop into both eyes daily as needed (dry eyes).     [provider]  lidocaine-prilocaine (EMLA) cream Apply 1 application topically every 14 (fourteen) days. Every 14 days as needed Patient taking differently: Apply 1 application topically as needed (port access).  06/17/19   Truitt Merle, MD  lisinopril (ZESTRIL) 40 MG tablet Take 1 tablet (40 mg total) by mouth daily. 03/02/19   Lattie Haw, MD  loratadine (CLARITIN) 10 MG tablet Take 1 tablet (10 mg total) by mouth daily as needed for allergies. 09/23/12   Waldemar Dickens, MD  LORazepam (ATIVAN) 0.5 MG tablet Take 0.5-1 tablets (0.25-0.5 mg total) by mouth at bedtime as needed for anxiety. Patient  taking differently: Take 0.5 mg by mouth at bedtime as needed for anxiety.  07/06/19   Alla Feeling, NP  metFORMIN (GLUCOPHAGE) 500 MG tablet TAKE 2 TABLETS TWICE DAILY WITH MEALS Patient taking differently: Take 1,000 mg by mouth 2 (two) times daily with a meal. TAKE 2 TABLETS TWICE DAILY WITH MEALS 03/02/19   Lattie Haw, MD  metoCLOPramide (REGLAN) 10 MG tablet Take 1 tablet (10 mg total) by mouth every 6 (six) hours as needed for nausea or vomiting. 01/11/20   Gareth Morgan, MD  mirtazapine (REMERON SOL-TAB) 15 MG disintegrating tablet Take 1 tablet (15 mg total) by mouth at bedtime. 01/06/20 01/05/21  Truitt Merle, MD  Multiple Vitamin (MULTIVITAMIN WITH MINERALS) TABS tablet Take 1 tablet by  mouth daily.    [provider]  Na Sulfate-K Sulfate-Mg Sulf (SUPREP BOWEL PREP KIT) 17.5-3.13-1.6 GM/177ML SOLN Take 1 kit by mouth as directed. 07/21/19   [name], MD  ondansetron (ZOFRAN ODT) 8 MG disintegrating tablet Take 1 tablet (8 mg total) by mouth every 8 (eight) hours as needed for nausea or vomiting. 01/12/20   Tanner, Lyndon Code., PA-C  ondansetron (ZOFRAN) 4 MG tablet Take 4 mg by mouth every 8 (eight) hours as needed for nausea or vomiting.    [provider]  oxyCODONE (OXY IR/ROXICODONE) 5 MG immediate release tablet Take 0.5-1 tablets (2.5-5 mg total) by mouth every 8 (eight) hours as needed for severe pain. 01/06/20   Truitt Merle, MD  polyvinyl alcohol (LIQUIFILM TEARS) 1.4 % ophthalmic solution Place 1 drop into both eyes as needed for dry eyes.    [provider]  potassium chloride (KLOR-CON) 10 MEQ tablet Take 1 tablet (10 mEq total) by mouth daily. 01/10/20   Truitt Merle, MD  Probiotic Product (PROBIOTIC PO) Take 1 capsule by mouth daily.    [provider]  prochlorperazine (COMPAZINE) 10 MG tablet Take 10 mg by mouth every 6 (six) hours as needed for nausea or vomiting.    [provider]  RIVAROXABAN Alveda Reasons) VTE STARTER PACK (15 & 20 MG  TABLETS) Follow package directions: Take one 61m tablet by mouth twice a day. On day 22, switch to one 231mtablet once a day. Take with food. 08/26/19   FeTruitt MerleMD  traMADol (ULTRAM) 50 MG tablet Take 1 tablet (50 mg total) by mouth every 6 (six) hours as needed. 12/20/19   FeTruitt MerleMD  VITAMIN E PO Take 1 tablet by mouth See admin instructions. 2-3 times a month    [provider]    Physical Exam: Vitals:   01/14/20 1338 01/14/20 1541 01/14/20 1703 01/14/20 1703  BP: (!) 143/61 (!) 141/58  (!) 151/62  Pulse: 69 72  66  Resp: (!) 22 18  19   Temp:    98.1 F (36.7 C)  TempSrc:   Oral Oral  SpO2: 100% 98%  96%  Weight:   63.5 kg   Height:   5' 3"  (1.6 m)     General: [age].o. ill appearing female resting in bed Eyes: PERRL, normal sclera ENMT: Nares patent w/o discharge, orophaynx clear, dentition normal, ears w/o discharge/lesions/ulcers Neck: Supple, trachea midline Cardiovascular: RRR, +S1, S2, no m/g/r, equal pulses throughout Respiratory: CTABL, no w/r/r, normal WOB GI: BS+, ND, RUQ TTP MSK: No e/c/c Skin: No rashes, bruises, ulcerations noted Neuro: A&O x 3, no focal deficits Psyc: drowsy, but cooperative  Labs on Admission: I have personally reviewed following labs and imaging studies  CBC: Recent Labs  Lab 01/11/20 0549 01/14/20 1100  WBC 6.2 6.7  NEUTROABS 4.8 4.6  HGB 13.4 13.1  HCT 41.7 41.1  MCV 88.0 89.5  PLT 133* 15299 Basic Metabolic Panel: Recent Labs  Lab 01/11/20 0549 01/14/20 1100  NA 139 140  K 3.7 3.1*  CL 100 97*  CO2 21* 26  GLUCOSE 165* 129*  BUN 11 21  CREATININE 0.70 0.77  CALCIUM 9.5 9.7   GFR: Estimated Creatinine Clearance: 56.2 mL/min (by C-G formula based on SCr of 0.77 mg/dL). Liver Function Tests: Recent Labs  Lab 01/11/20 0549 01/14/20 1100  AST 41 46*  ALT 25 30  ALKPHOS 97 97  BILITOT 1.4* 1.4*  PROT 7.4 7.3  ALBUMIN 3.6  3.5   Recent Labs  Lab 01/11/20 0549  LIPASE 21   No results for  input(s): AMMONIA in the last 168 hours. Coagulation Profile: Recent Labs  Lab 01/11/20 0605  INR 1.1   Cardiac Enzymes: No results for input(s): CKTOTAL, CKMB, CKMBINDEX, TROPONINI in the last 168 hours. BNP (last 3 results) No results for input(s): PROBNP in the last 8760 hours. HbA1C: No results for input(s): HGBA1C in the last 72 hours. CBG: No results for input(s): GLUCAP in the last 168 hours. Lipid Profile: No results for input(s): CHOL, HDL, LDLCALC, TRIG, CHOLHDL, LDLDIRECT in the last 72 hours. Thyroid Function Tests: No results for input(s): TSH, T4TOTAL, FREET4, T3FREE, THYROIDAB in the last 72 hours. Anemia Panel: No results for input(s): VITAMINB12, FOLATE, FERRITIN, TIBC, IRON, RETICCTPCT in the last 72 hours. Urine analysis:    Component Value Date/Time   COLORURINE YELLOW 01/11/2020 0956   APPEARANCEUR CLEAR 01/11/2020 0956   LABSPEC >1.046 (H) 01/11/2020 0956   PHURINE 6.0 01/11/2020 0956   GLUCOSEU NEGATIVE 01/11/2020 0956   HGBUR NEGATIVE 01/11/2020 0956   BILIRUBINUR NEGATIVE 01/11/2020 0956   BILIRUBINUR negative 01/04/2013 1530   KETONESUR 80 (A) 01/11/2020 0956   PROTEINUR 30 (A) 01/11/2020 0956   UROBILINOGEN 0.2 10/09/2013 2037   NITRITE NEGATIVE 01/11/2020 0956   LEUKOCYTESUR NEGATIVE 01/11/2020 0956    Radiological Exams on Admission: CT Abdomen Pelvis Wo Contrast  Result Date: 01/14/2020 CLINICAL DATA:  Abdominal distension with nausea and vomiting. History of cholangiocarcinoma. EXAM: CT ABDOMEN AND PELVIS WITHOUT CONTRAST TECHNIQUE: Multidetector CT imaging of the abdomen and pelvis was performed following the standard protocol without IV contrast. COMPARISON:  01/11/2020 FINDINGS: Lower chest: Subpleural 5 mm nodule over the right middle lobe without significant change from 2019. Subcentimeter nodule over the posterior right lower lobe unchanged 2019. No new nodules identified. Minimal bibasilar scarring. No effusion. Mild cardiomegaly.  Calcified plaque over the left anterior descending and right coronary arteries. Minimal calcified plaque over the descending thoracic aorta and calcified right infrahilar lymph node. Hepatobiliary: Postsurgical changes over the right liver. Multiple liver masses and heterogeneity compatible with known diffuse metastatic disease not well evaluated on this noncontrast study. Prior cholecystectomy. Biliary tree unremarkable. Pancreas: Unremarkable. Spleen: Normal. Adrenals/Urinary Tract: Adrenal glands are normal. Kidneys are normal in size without hydronephrosis. Subcentimeter cortical calcification over the lower pole right kidney unchanged. 11 mm cortical hyperdensity over the mid pole right kidney likely hyperdense cyst. Ureters and bladder are normal. Stomach/Bowel: Continued wall thickening of the distal stomach. Visualized small bowel is unremarkable. Appendix is normal. Stable oval fat density structure measuring approximately 3.4 cm over the sigmoid colon possibly lipomatous polyp. Vascular/Lymphatic: Calcified plaque over the abdominal aorta which is normal caliber. Minimal periaortic adenopathy unchanged. Reproductive: Previous hysterectomy. Other: No significant free peritoneal fluid or free peritoneal air. Musculoskeletal: Moderate degenerative change of the spine. IMPRESSION: 1. No acute findings in the abdomen/pelvis. 2. Continued indeterminate wall thickening of the distal stomach unchanged which may be due to post treatment change and less likely metastatic disease. Multiple liver masses and heterogeneity compatible with known diffuse metastatic disease not well evaluated on this noncontrast study. Minimal periaortic adenopathy unchanged. 3. 11 mm cortical hyperdensity over the mid pole right kidney likely hyperdense cyst. Subcentimeter cortical calcification over the lower pole right kidney unchanged. 4. Stable 3.4 cm oval fat density structure over the sigmoid colon likely lipomatous polyp. 5. Aortic  atherosclerosis. Atherosclerotic coronary artery disease. 6. Two subcentimeter nodules over the right mid and  lower lung unchanged from 2019 and therefore considered benign. Aortic Atherosclerosis (ICD10-I70.0). Electronically Signed   By: Marin Olp M.D.   On: 01/14/2020 14:11   DG ABD ACUTE 2+V W 1V CHEST  Result Date: 01/14/2020 CLINICAL DATA:  Abdominal pain EXAM: X-RAY ABDOMEN 2 VIEW COMPARISON:  01/11/2020 FINDINGS: Relative paucity of bowel gas within the upper abdomen. There is no evidence of dilated bowel loops or free intraperitoneal air. No radiopaque calculi or other significant radiographic abnormality is seen. Multiple surgical clips within the right upper quadrant Right-sided Port-A-Cath terminates at the level of the distal SVC. Heart size is upper limits of normal. Atherosclerotic calcification of the aortic knob. No focal airspace consolidation, pleural effusion, or pneumothorax. IMPRESSION: 1. Negative for bowel obstruction or free air. 2. No acute cardiopulmonary findings. Electronically Signed   By: Davina Poke D.O.   On: 01/14/2020 11:48   Assessment/Plan Intractable N/V     - admit to inpatient, med-surg     - zofran PRN     - CLD as tolerated     - IVF  Intrahepatic invasive cholangiocarcinoma     - followed by Dr Burr Medico. Recent imaging led to patient/family decision for home hospice     - Dr Burr Medico has seen today; rec no aggressive tx     - rec palliative care consult; they have been consulted     - PRN pain, N meds  Hospice patient     - palliative care consulted     - will likely need a residential hospice setting  Hypokalemia     - K+ ordered for replacement  DM2     - hold home metformin     - follow glucose  HTN     - PRNs ordered  DVT prophylaxis: lovenox  Code Status: DNR  Family Communication: With dtr by phone  Consults called: Palliative care consulted  Admission status: Inpatient  Status is: Inpatient  Remains inpatient appropriate  because:IV treatments appropriate due to intensity of illness or inability to take PO   Dispo: The patient is from: Home              Anticipated d/c is to: Residential hospice              Anticipated d/c date is: 2 days              Patient currently is not medically stable to d/c.  Jonnie Finner DO [facility]  If 7PM-7AM, please contact night-coverage [email or web address]  01/14/2020, 6:00 PM

## 2020-01-14 NOTE — Progress Notes (Addendum)
Oncology follow-up note  I was informed that patient was brought to [facility] long ED today.  I saw her in ED, she was sleeping comfortably, and I did not wake her up. chart reviewed.  I also spoke with hospice nurse, patient's symptoms are unable to be able to manage adequately at home, and the [facility] does not have a bed open at this point.  Patient will be admitted to [facility] for symptom management and comfort care.  Patient and her family has agreed comfort care based on my last visit 2 days ago.  I do not recommend any aggressive measurements, she has previous agreed to DNR. May consult palliative care if needed.   I called her daughter [name] and left her a VM about the above.   Truitt Merle  01/14/2020

## 2020-01-14 NOTE — Progress Notes (Signed)
Manufacturing engineer [facility]) Hospital Liaison note.    Ms Bralley was admitted to hospice services yesterday.  Per daughter she is having n/v unmanaged with PO meds.  [facility] MD concerned for high obstruction given last CT on Tuesday.   Hospital Liaison team to follow. Please do not hesitate to call with questions.    Thank you for the opportunity to participate in this patient's care.  Domenic Moras, BSN, RN [facility] Liaison (listed on AMION under Hospice/[facility])   [phone]  [phone] (24h on call)

## 2020-01-14 NOTE — ED Notes (Signed)
Called report to Amy.

## 2020-01-15 DIAGNOSIS — Z7189 Other specified counseling: Secondary | ICD-10-CM

## 2020-01-15 DIAGNOSIS — E860 Dehydration: Secondary | ICD-10-CM

## 2020-01-15 DIAGNOSIS — R112 Nausea with vomiting, unspecified: Secondary | ICD-10-CM

## 2020-01-15 DIAGNOSIS — Z515 Encounter for palliative care: Principal | ICD-10-CM

## 2020-01-15 DIAGNOSIS — C221 Intrahepatic bile duct carcinoma: Secondary | ICD-10-CM

## 2020-01-15 LAB — COMPREHENSIVE METABOLIC PANEL
ALT: 29 U/L (ref 0–44)
AST: 54 U/L — ABNORMAL HIGH (ref 15–41)
Albumin: 2.6 g/dL — ABNORMAL LOW (ref 3.5–5.0)
Alkaline Phosphatase: 79 U/L (ref 38–126)
Anion gap: 9 (ref 5–15)
BUN: 16 mg/dL (ref 8–23)
CO2: 25 mmol/L (ref 22–32)
Calcium: 8.6 mg/dL — ABNORMAL LOW (ref 8.9–10.3)
Chloride: 105 mmol/L (ref 98–111)
Creatinine, Ser: 0.65 mg/dL (ref 0.44–1.00)
GFR, Estimated: >60 mL/min (ref >60)
Glucose, Bld: 79 mg/dL (ref 70–99)
Potassium: 3.5 mmol/L (ref 3.5–5.1)
Sodium: 139 mmol/L (ref 135–145)
Total Bilirubin: 1 mg/dL (ref 0.3–1.2)
Total Protein: 5.5 g/dL — ABNORMAL LOW (ref 6.5–8.1)

## 2020-01-15 LAB — GLUCOSE, CAPILLARY
Glucose-Capillary: 75 mg/dL (ref 70–99)
Glucose-Capillary: 92 mg/dL (ref 70–99)

## 2020-01-15 LAB — CBC
HCT: 35.2 % — ABNORMAL LOW (ref 36.0–46.0)
Hemoglobin: 11.1 g/dL — ABNORMAL LOW (ref 12.0–15.0)
MCH: 28.7 pg (ref 26.0–34.0)
MCHC: 31.5 g/dL (ref 30.0–36.0)
MCV: 91 fL (ref 80.0–100.0)
Platelets: 114 10*3/uL — ABNORMAL LOW (ref 150–400)
RBC: 3.87 MIL/uL (ref 3.87–5.11)
RDW: 15.7 % — ABNORMAL HIGH (ref 11.5–15.5)
WBC: 4.3 10*3/uL (ref 4.0–10.5)
nRBC: 0 % (ref 0.0–0.2)

## 2020-01-15 MED ORDER — LORAZEPAM 0.5 MG PO TABS: 0.5000 mg | ORAL_TABLET | ORAL | 0 refills | Status: DC | PRN

## 2020-01-15 MED ORDER — METOCLOPRAMIDE HCL 5 MG PO TABS
5.0000 mg | ORAL_TABLET | Freq: Three times a day (TID) | ORAL | 0 refills | Status: DC
Start: 2020-01-15 — End: 2020-02-23

## 2020-01-15 MED ORDER — ENOXAPARIN SODIUM 40 MG/0.4ML ~~LOC~~ SOLN: 40.0000 mg | SUBCUTANEOUS | Status: DC

## 2020-01-15 MED ORDER — METOCLOPRAMIDE HCL 5 MG PO TABS
5.0000 mg | ORAL_TABLET | Freq: Three times a day (TID) | ORAL | Status: DC
  Administered 2020-01-15: 5 mg via ORAL
  Filled 2020-01-15: qty 1

## 2020-01-15 MED ORDER — HALOPERIDOL LACTATE 2 MG/ML PO CONC
0.5000 mg | Freq: Four times a day (QID) | ORAL | Status: DC | PRN
  Filled 2020-01-15: qty 0.3

## 2020-01-15 MED ORDER — HALOPERIDOL 0.5 MG PO TABS: 0.5000 mg | ORAL_TABLET | Freq: Four times a day (QID) | ORAL | 0 refills | Status: DC | PRN

## 2020-01-15 MED ORDER — LORAZEPAM 2 MG/ML PO CONC: 0.5000 mg | ORAL | Status: DC | PRN

## 2020-01-15 MED ORDER — HEPARIN SOD (PORK) LOCK FLUSH 100 UNIT/ML IV SOLN
500.0000 [IU] | Freq: Once | INTRAVENOUS | Status: AC
  Administered 2020-01-15: 500 [IU] via INTRAVENOUS
  Filled 2020-01-15: qty 5

## 2020-01-15 MED ORDER — HALOPERIDOL 0.5 MG PO TABS: 0.5000 mg | ORAL_TABLET | Freq: Four times a day (QID) | ORAL | Status: DC | PRN

## 2020-01-15 MED ORDER — LORAZEPAM 0.5 MG PO TABS: 0.5000 mg | ORAL_TABLET | ORAL | Status: DC | PRN

## 2020-01-15 NOTE — Discharge Summary (Signed)
Physician Discharge Summary  Bonnie Peterson QIH:[id number] DOB: January 28, 1946 DOA: 01/14/2020  PCP: Lattie Haw, MD  Admit date: 01/14/2020 Discharge date: 01/15/2020  Admitted From: home hospice Disposition:  home with hospice  Recommendations for Outpatient Follow-up:  1. Follow up with PCP in 1-2 weeks 2. Please obtain BMP/CBC in one week 3. Please follow up on the following pending results:  Home Health:no  Equipment/Devices: none  Discharge Condition: Stable Code Status:   Code Status: DNR Diet recommendation:  Diet Order            Diet clear liquid Room service appropriate? Yes; Fluid consistency: Thin  Diet effective now                  Brief/Interim Summary: Brief Narrative: As per admitting:74 yof w/ history significant of metastatic cholangiocarcinoma presenting from home with intractable nausea vomiting for past several days.  She was recently seen in the ED for abdominal pain and in oncology clinic for nausea vomiting, recent imaging showed progression of disease to stage IV and patient family made decision to pursue home hospice, and has been on antiemetics, supportive care at home.  She has not been able to hold down solids or liquid. ED Course: She was given reglan and fluids. Her nausea improved.  She had CT abdomen pelvis no acute finding, indeterminate wall thickening of the distal stomach unchanged. Patient was admitted for symptomatic management.  Seen by palliative care today and had extensive discussion with hospice team patient and patient's family and they are in agreement with trying oral medication and if patient tolerates diet plan for discharge home with hospice. Plan of care discussed with patient's daughter and patient and they are in agreement.  Discharge Diagnoses:  Assessment & Plan:  Intractable nausea and vomiting: Seems to have resolved.  CT abdomen pelvis did not show any acute finding or obstruction. "Continued indeterminate wall thickening  of the distal stomach unchanged which may be due to post treatment change and less likely metastatic disease. Multiple liver masses and heterogeneity compatible with known diffuse metastatic disease not well evaluated on this noncontrast study. Minimal periaortic adenopathy unchanged. 3. 11 mm cortical hyperdensity over the mid pole right kidney likely hyperdense cyst. Subcentimeter cortical calcification over the lower pole right kidney unchanged" She will go home with Reglan 4 times a day, in addition to Haldol and Ativan which is not prescribed by the palliative care to her pharmacy already.  Hepatic invasive cholangiocarcinoma:stage IV,followed by Dr.Feng-recommendations no aggressive management but comfort measures.  Hypokalemia resolved  T2DM hold oral medication, monitor CBG  Hypertension bl stable  At this time pt is stable for d/c to home with hospice Tolerated diet, no vomiting Family in agreement with care plan.  Consults: Palliative care,oncology  Subjective: Resting well no nausea vomiting.  Daughter at the bedside.   Discharge Exam: Vitals:   01/15/20 0505 01/15/20 1428  BP: 128/67 (!) 143/53  Pulse: 85 70  Resp: 14 17  Temp: 98.5 F (36.9 C) 98.2 F (36.8 C)  SpO2: 95% 98%   General: Pt is alert, awake, not in acute distress Cardiovascular: RRR, S1/S2 +, no rubs, no gallops Respiratory: CTA bilaterally, no wheezing, no rhonchi Abdominal: Soft, NT, ND, bowel sounds + Extremities: no edema, no cyanosis  Discharge Instructions  Discharge Instructions    Increase activity slowly   Complete by: As directed      Allergies as of 01/15/2020      Reactions   Other  Propofol - Anesthesia for MRI--made her "breathe funny"   Propofol Other (See Comments)   "It makes me feel like I'm on fire."      Medication List    TAKE these medications   albuterol 108 (90 Base) MCG/ACT inhaler Commonly known as: ProAir HFA Inhale 1-2 puffs into the lungs every 6  (six) hours as needed for wheezing or shortness of breath.   alendronate 70 MG tablet Commonly known as: FOSAMAX TAKE 1 TABLET  ONCE A WEEK. What changed:   how much to take  how to take this  when to take this   amLODipine 10 MG tablet Commonly known as: NORVASC TAKE 1 TABLET EVERY DAY   atenolol 25 MG tablet Commonly known as: TENORMIN Take 1 tablet (25 mg total) by mouth daily.   cholecalciferol 25 MCG (1000 UNIT) tablet Commonly known as: VITAMIN D Take 1 tablet (1,000 Units total) by mouth daily.   CLEAR EYES COMPLETE OP Place 1 drop into both eyes daily as needed (dry eyes).   diclofenac sodium 1 % Gel Commonly known as: VOLTAREN Apply 2 g topically 4 (four) times daily as needed.   dicyclomine 20 MG tablet Commonly known as: BENTYL Take 1 tablet (20 mg total) by mouth 2 (two) times daily as needed for spasms (abdominal pain).   famotidine 20 MG tablet Commonly known as: PEPCID Take 1 tablet (20 mg total) by mouth 2 (two) times daily.   fish oil-omega-3 fatty acids 1000 MG capsule Take 1 g by mouth every 30 (thirty) days.   fluticasone 50 MCG/ACT nasal spray Commonly known as: FLONASE Place 2 sprays into both nostrils daily as needed for rhinitis.   haloperidol 0.5 MG tablet Commonly known as: HALDOL Take 1 tablet (0.5 mg total) by mouth every 6 (six) hours as needed for agitation (or nausea).   hydrochlorothiazide 25 MG tablet Commonly known as: HYDRODIURIL Take 1 tablet (25 mg total) by mouth daily.   lidocaine-prilocaine cream Commonly known as: EMLA Apply 1 application topically every 14 (fourteen) days. Every 14 days as needed What changed:   when to take this  reasons to take this  additional instructions   lisinopril 40 MG tablet Commonly known as: ZESTRIL Take 1 tablet (40 mg total) by mouth daily.   loratadine 10 MG tablet Commonly known as: CLARITIN Take 1 tablet (10 mg total) by mouth daily as needed for allergies.   LORazepam  0.5 MG tablet Commonly known as: ATIVAN Take 1 tablet (0.5 mg total) by mouth every 4 (four) hours as needed for anxiety (or nausea that is not controlled by other medications). What changed:   how much to take  when to take this  reasons to take this   metFORMIN 500 MG tablet Commonly known as: GLUCOPHAGE TAKE 2 TABLETS TWICE DAILY WITH MEALS What changed:   how much to take  how to take this  when to take this   metoCLOPramide 5 MG tablet Commonly known as: REGLAN Take 1 tablet (5 mg total) by mouth 4 (four) times daily -  before meals and at bedtime. What changed:   medication strength  how much to take  when to take this  reasons to take this   mirtazapine 15 MG disintegrating tablet Commonly known as: REMERON SOL-TAB Take 1 tablet (15 mg total) by mouth at bedtime.   multivitamin with minerals Tabs tablet Take 1 tablet by mouth daily.   ondansetron 4 MG tablet Commonly known as: ZOFRAN Take 4  mg by mouth every 8 (eight) hours as needed for nausea or vomiting.   ondansetron 8 MG disintegrating tablet Commonly known as: Zofran ODT Take 1 tablet (8 mg total) by mouth every 8 (eight) hours as needed for nausea or vomiting.   oxyCODONE 5 MG immediate release tablet Commonly known as: Oxy IR/ROXICODONE Take 0.5-1 tablets (2.5-5 mg total) by mouth every 8 (eight) hours as needed for severe pain.   potassium chloride 10 MEQ tablet Commonly known as: KLOR-CON Take 1 tablet (10 mEq total) by mouth daily.   PROBIOTIC PO Take 1 capsule by mouth daily.   Rivaroxaban Stater Pack (15 mg and 20 mg) Commonly known as: XARELTO STARTER PACK Follow package directions: Take one 23m tablet by mouth twice a day. On day 22, switch to one 241mtablet once a day. Take with food.   Suprep Bowel Prep Kit 17.5-3.13-1.6 GM/177ML Soln Generic drug: Na Sulfate-K Sulfate-Mg Sulf Take 1 kit by mouth as directed.   traMADol 50 MG tablet Commonly known as: ULTRAM Take 1  tablet (50 mg total) by mouth every 6 (six) hours as needed.   VITAMIN E PO Take 1 tablet by mouth See admin instructions. 2-3 times a month       Follow-up Information    PaLattie HawMD Follow up in 1 day(s).   Specialty: Family Medicine Contact information: [phone]. [phone]            Allergies  Allergen Reactions  . Other     Propofol - Anesthesia for MRI--made her "breathe funny"  . Propofol Other (See Comments)    "It makes me feel like I'm on fire."    The results of significant diagnostics from this hospitalization (including imaging, microbiology, ancillary and laboratory) are listed below for reference.    Microbiology: Recent Results (from the past 240 hour(s))  Urine culture     Status: Abnormal   Collection Time: 01/06/20  1:00 PM   Specimen: Urine, Clean Catch  Result Value Ref Range Status   Specimen Description   Final    URINE, CLEAN CATCH Performed at [facility], [address] [place] [id number]  Special Requests   Final    NONE [facility]formed at [facility], [address] [place] [address]  Culture MULTIPLE SPECIES PRESENT, SUGGEST RECOLLECTION (A)  Final   Report Status 01/07/2020 FINAL  Final  Respiratory Panel by RT PCR (Flu A&B, Covid) - Nasopharyngeal Swab     Status: None   Collection Time: 01/14/20 11:00 AM   Specimen: Nasopharyngeal Swab  Result Value Ref Range Status   SARS Coronavirus 2 by RT PCR NEGATIVE NEGATIVE Final    Comment: (NOTE) SARS-CoV-2 target nucleic acids are NOT DETECTED.  The SARS-CoV-2 RNA is generally detectable in upper respiratoy specimens during the acute phase of infection. The lowest concentration of SARS-CoV-2 viral copies this assay can detect is 131 copies/mL. A negative result does not preclude SARS-Cov-2 infection and should not be used as the sole basis for treatment or other patient  management decisions. A negative result may occur with  improper specimen collection/handling, submission of specimen other than nasopharyngeal swab, presence of viral mutation(s) within the areas targeted by this assay, and inadequate number of viral copies (<131 copies/mL). A negative result must be combined with clinical observations, patient history, and epidemiological information. The expected result is Negative.  Fact Sheet  for Patients:  PinkCheek.be  Fact Sheet for Healthcare Providers:  GravelBags.it  This test is no t yet approved or cleared by the [place] FDA and  has been authorized for detection and/or diagnosis of SARS-CoV-2 by FDA under an Emergency Use Authorization (EUA). This EUA will remain  in effect (meaning this test can be used) for the duration of the COVID-19 declaration under Section 564(b)(1) of the Act, 21 U.S.C. section 360bbb-3(b)(1), unless the authorization is terminated or revoked sooner.     Influenza A by PCR NEGATIVE NEGATIVE Final   Influenza B by PCR NEGATIVE NEGATIVE Final    Comment: (NOTE) The Xpert Xpress SARS-CoV-2/FLU/RSV assay is intended as an aid in  the diagnosis of influenza from Nasopharyngeal swab specimens and  should not be used as a sole basis for treatment. Nasal washings and  aspirates are unacceptable for Xpert Xpress SARS-CoV-2/FLU/RSV  testing.  Fact Sheet for Patients: PinkCheek.be  Fact Sheet for Healthcare Providers: GravelBags.it  This test is not yet approved or cleared by the [place] FDA and  has been authorized for detection and/or diagnosis of SARS-CoV-2 by  FDA under an Emergency Use Authorization (EUA). This EUA will remain  in effect (meaning this test can be used) for the duration of the  Covid-19 declaration under Section 564(b)(1) of the Act, 21  U.S.C. section 360bbb-3(b)(1),  unless the authorization is  terminated or revoked. Performed at [facility], [place] [address], [place], [place] [address]     Procedures/Studies: CT Abdomen Pelvis Wo Contrast  Result Date: 01/14/2020 CLINICAL DATA:  Abdominal distension with nausea and vomiting. History of cholangiocarcinoma. EXAM: CT ABDOMEN AND PELVIS WITHOUT CONTRAST TECHNIQUE: Multidetector CT imaging of the abdomen and pelvis was performed following the standard protocol without IV contrast. COMPARISON:  01/11/2020 FINDINGS: Lower chest: Subpleural 5 mm nodule over the right middle lobe without significant change from 2019. Subcentimeter nodule over the posterior right lower lobe unchanged 2019. No new nodules identified. Minimal bibasilar scarring. No effusion. Mild cardiomegaly. Calcified plaque over the left anterior descending and right coronary arteries. Minimal calcified plaque over the descending thoracic aorta and calcified right infrahilar lymph node. Hepatobiliary: Postsurgical changes over the right liver. Multiple liver masses and heterogeneity compatible with known diffuse metastatic disease not well evaluated on this noncontrast study. Prior cholecystectomy. Biliary tree unremarkable. Pancreas: Unremarkable. Spleen: Normal. Adrenals/Urinary Tract: Adrenal glands are normal. Kidneys are normal in size without hydronephrosis. Subcentimeter cortical calcification over the lower pole right kidney unchanged. 11 mm cortical hyperdensity over the mid pole right kidney likely hyperdense cyst. Ureters and bladder are normal. Stomach/Bowel: Continued wall thickening of the distal stomach. Visualized small bowel is unremarkable. Appendix is normal. Stable oval fat density structure measuring approximately 3.4 cm over the sigmoid colon possibly lipomatous polyp. Vascular/Lymphatic: Calcified plaque over the abdominal aorta which is normal caliber. Minimal periaortic adenopathy unchanged. Reproductive: Previous  hysterectomy. Other: No significant free peritoneal fluid or free peritoneal air. Musculoskeletal: Moderate degenerative change of the spine. IMPRESSION: 1. No acute findings in the abdomen/pelvis. 2. Continued indeterminate wall thickening of the distal stomach unchanged which may be due to post treatment change and less likely metastatic disease. Multiple liver masses and heterogeneity compatible with known diffuse metastatic disease not well evaluated on this noncontrast study. Minimal periaortic adenopathy unchanged. 3. 11 mm cortical hyperdensity over the mid pole right kidney likely hyperdense cyst. Subcentimeter cortical calcification over the lower pole right kidney unchanged. 4. Stable 3.4 cm oval fat density structure  over the sigmoid colon likely lipomatous polyp. 5. Aortic atherosclerosis. Atherosclerotic coronary artery disease. 6. Two subcentimeter nodules over the right mid and lower lung unchanged from 2019 and therefore considered benign. Aortic Atherosclerosis (ICD10-I70.0). Electronically Signed   By: Marin Olp M.D.   On: 01/14/2020 14:11   CT ABDOMEN PELVIS W CONTRAST  Result Date: 01/11/2020 CLINICAL DATA:  74 year old female with history of abdominal distension and abdominal pain. History of cholangiocarcinoma treated with prior surgical resection, chemotherapy and radiation therapy. EXAM: CT ABDOMEN AND PELVIS WITH CONTRAST TECHNIQUE: Multidetector CT imaging of the abdomen and pelvis was performed using the standard protocol following bolus administration of intravenous contrast. CONTRAST:  148m OMNIPAQUE IOHEXOL 300 MG/ML  SOLN COMPARISON:  CT the abdomen and pelvis 08/17/2019. FINDINGS: Lower chest: Lipomatous hypertrophy of the interatrial septum incidentally noted. Mild linear scarring or atelectasis in the right middle lobe and inferior segment of the lingula. Hepatobiliary: Status post partial hepatectomy with resection of the left lobe of the liver. Within the remaining right  lobe of the liver there are numerous lesions range from predominantly hypovascular to predominantly hypervascular in appearance. These appear increased in number and size compared to the prior study from 08/17/2019. The largest hypervascular lesion or conglomeration of lesions is in the inferior aspect of the right lobe of the liver (axial image 34 of series 2) measuring 7.4 x 4.3 cm. The largest hypovascular lesion or conglomeration of hypovascular lesions is in the superior aspect of the right lobe of the liver (axial image 12 of series 2) measuring approximately 7.6 x 6.0 cm. Gallbladder is surgically absent. Pancreas: No pancreatic mass. No pancreatic ductal dilatation. No pancreatic or peripancreatic fluid collections or inflammatory changes. Spleen: Unremarkable. Adrenals/Urinary Tract: Small cortical calcification in the interpolar region of the right kidney. Left kidney and bilateral adrenal glands are normal in appearance. No hydroureteronephrosis. Urinary bladder is normal in appearance. Stomach/Bowel: Profound mural thickening in the distal stomach, predominantly involving the antrum, somewhat mass-like in appearance, best appreciated on axial image 16 of series 2. This thickening extends to the pyloric region as well as the duodenal bulb. These findings may simply be sequela of prior radiation therapy, however, underlying mass is difficult to exclude given the appearance. No pathologic dilatation of small bowel or colon. There are 2 fatty attenuation lesions noted within the colon. The smaller of these lesions is in the region of the splenic flexure measuring 1.3 x 0.8 cm (axial image 27 of series 2). The larger lesion is in the region of the sigmoid colon measuring approximately 4.6 x 2.4 x 1.9 cm, where the lesion appears pedunculated and extending into the sigmoid colon without frank intussusception at this time. Normal appendix. Vascular/Lymphatic: Aortic atherosclerosis, without evidence of aneurysm  or dissection in the abdominal or pelvic vasculature. There is extensive amorphous soft tissue in the upper abdomen which is intimately associated with the suprarenal abdominal aorta, celiac axis, proximal common hepatic artery and superior mesenteric arteries, likely to represent malignant nodal tissue, markedly increased compared to the prior study. The bulkiest portion of this soft tissues well demonstrated on axial image 14 of series 2 where this is estimated to measure approximately 6.2 x 2.7 cm. Multiple other borderline enlarged and enlarged retroperitoneal lymph nodes are also noted measuring up to 1.1 cm in short axis in the left para-aortic nodal station (axial image 29 of series 2). No definite pelvic lymphadenopathy. Reproductive: Status post hysterectomy. Ovaries are not confidently identified may be surgically absent or atrophic. Other:  Trace volume of ascites.  No pneumoperitoneum. Musculoskeletal: There are no aggressive appearing lytic or blastic lesions noted in the visualized portions of the skeleton. IMPRESSION: 1. Today's study demonstrates progression of disease with increased number and size of numerous metastatic lesions within the liver, as well as progressive lymphadenopathy in the upper abdomen and retroperitoneum. 2. Mass-like thickening throughout the distal stomach and proximal duodenum, favored to be sequela of prior radiation therapy, although an underlying mass is difficult to entirely exclude. 3. Lipomatous polyps noted in the colon, largest of which is a pedunculated lesion which extends over a significant length of the lumen in the sigmoid colon, without frank intussusception at this time. 4. Additional incidental findings, as above. Electronically Signed   By: Vinnie Langton M.D.   On: 01/11/2020 08:22   DG ABD ACUTE 2+V W 1V CHEST  Result Date: 01/14/2020 CLINICAL DATA:  Abdominal pain EXAM: X-RAY ABDOMEN 2 VIEW COMPARISON:  01/11/2020 FINDINGS: Relative paucity of bowel  gas within the upper abdomen. There is no evidence of dilated bowel loops or free intraperitoneal air. No radiopaque calculi or other significant radiographic abnormality is seen. Multiple surgical clips within the right upper quadrant Right-sided Port-A-Cath terminates at the level of the distal SVC. Heart size is upper limits of normal. Atherosclerotic calcification of the aortic knob. No focal airspace consolidation, pleural effusion, or pneumothorax. IMPRESSION: 1. Negative for bowel obstruction or free air. 2. No acute cardiopulmonary findings. Electronically Signed   By: Davina Poke D.O.   On: 01/14/2020 11:48    Labs: BNP (last 3 results) No results for input(s): BNP in the last 8760 hours. Basic Metabolic Panel: Recent Labs  Lab 01/11/20 0549 01/14/20 1100 01/15/20 0500  NA 139 140 139  K 3.7 3.1* 3.5  CL 100 97* 105  CO2 21* 26 25  GLUCOSE 165* 129* 79  BUN 11 21 16   CREATININE 0.70 0.77 0.65  CALCIUM 9.5 9.7 8.6*   Liver Function Tests: Recent Labs  Lab 01/11/20 0549 01/14/20 1100 01/15/20 0500  AST 41 46* 54*  ALT 25 30 29   ALKPHOS 97 97 79  BILITOT 1.4* 1.4* 1.0  PROT 7.4 7.3 5.5*  ALBUMIN 3.6 3.5 2.6*   Recent Labs  Lab 01/11/20 0549  LIPASE 21   No results for input(s): AMMONIA in the last 168 hours. CBC: Recent Labs  Lab 01/11/20 0549 01/14/20 1100 01/15/20 0500  WBC 6.2 6.7 4.3  NEUTROABS 4.8 4.6  --   HGB 13.4 13.1 11.1*  HCT 41.7 41.1 35.2*  MCV 88.0 89.5 91.0  PLT 133* 155 114*   Cardiac Enzymes: No results for input(s): CKTOTAL, CKMB, CKMBINDEX, TROPONINI in the last 168 hours. BNP: Invalid input(s): POCBNP CBG: Recent Labs  Lab 01/14/20 2248 01/14/20 2316 01/15/20 0747 01/15/20 1156  GLUCAP 61* 74 75 92   D-Dimer No results for input(s): DDIMER in the last 72 hours. Hgb A1c No results for input(s): HGBA1C in the last 72 hours. Lipid Profile No results for input(s): CHOL, HDL, LDLCALC, TRIG, CHOLHDL, LDLDIRECT in the last  72 hours. Thyroid function studies No results for input(s): TSH, T4TOTAL, T3FREE, THYROIDAB in the last 72 hours.  Invalid input(s): FREET3 Anemia work up No results for input(s): VITAMINB12, FOLATE, FERRITIN, TIBC, IRON, RETICCTPCT in the last 72 hours. Urinalysis    Component Value Date/Time   COLORURINE YELLOW 01/11/2020 0956   APPEARANCEUR CLEAR 01/11/2020 0956   LABSPEC >1.046 (H) 01/11/2020 0956   PHURINE 6.0 01/11/2020 0956   GLUCOSEU  NEGATIVE 01/11/2020 0956   HGBUR NEGATIVE 01/11/2020 [place] 01/11/2020 0956   BILIRUBINUR negative 01/04/2013 1530   KETONESUR 80 (A) 01/11/2020 0956   PROTEINUR 30 (A) 01/11/2020 0956   UROBILINOGEN 0.2 10/09/2013 2037   NITRITE NEGATIVE 01/11/2020 0956   LEUKOCYTESUR NEGATIVE 01/11/2020 0956   Sepsis Labs Invalid input(s): PROCALCITONIN,  WBC,  LACTICIDVEN Microbiology Recent Results (from the past 240 hour(s))  Urine culture     Status: Abnormal   Collection Time: 01/06/20  1:00 PM   Specimen: Urine, Clean Catch  Result Value Ref Range Status   Specimen Description   Final    URINE, CLEAN CATCH Performed at [facility], [place] [address] [place], [place] [address]    Special Requests   Final    NONE Performed at [facility], [place] [address], [place], [place] [address]    Culture MULTIPLE SPECIES PRESENT, SUGGEST RECOLLECTION (A)  Final   Report Status 01/07/2020 FINAL  Final  Respiratory Panel by RT PCR (Flu A&B, Covid) - Nasopharyngeal Swab     Status: None   Collection Time: 01/14/20 11:00 AM   Specimen: Nasopharyngeal Swab  Result Value Ref Range Status   SARS Coronavirus 2 by RT PCR NEGATIVE NEGATIVE Final    Comment: (NOTE) SARS-CoV-2 target nucleic acids are NOT DETECTED.  The SARS-CoV-2 RNA is generally detectable in upper respiratoy specimens during the acute phase of infection. The lowest concentration of SARS-CoV-2 viral copies this assay can  detect is 131 copies/mL. A negative result does not preclude SARS-Cov-2 infection and should not be used as the sole basis for treatment or other patient management decisions. A negative result may occur with  improper specimen collection/handling, submission of specimen other than nasopharyngeal swab, presence of viral mutation(s) within the areas targeted by this assay, and inadequate number of viral copies (<131 copies/mL). A negative result must be combined with clinical observations, patient history, and epidemiological information. The expected result is Negative.  Fact Sheet for Patients:  PinkCheek.be  Fact Sheet for Healthcare Providers:  GravelBags.it  This test is no t yet approved or cleared by the [place] FDA and  has been authorized for detection and/or diagnosis of SARS-CoV-2 by FDA under an Emergency Use Authorization (EUA). This EUA will remain  in effect (meaning this test can be used) for the duration of the COVID-19 declaration under Section 564(b)(1) of the Act, 21 U.S.C. section 360bbb-3(b)(1), unless the authorization is terminated or revoked sooner.     Influenza A by PCR NEGATIVE NEGATIVE Final   Influenza B by PCR NEGATIVE NEGATIVE Final    Comment: (NOTE) The Xpert Xpress SARS-CoV-2/FLU/RSV assay is intended as an aid in  the diagnosis of influenza from Nasopharyngeal swab specimens and  should not be used as a sole basis for treatment. Nasal washings and  aspirates are unacceptable for Xpert Xpress SARS-CoV-2/FLU/RSV  testing.  Fact Sheet for Patients: PinkCheek.be  Fact Sheet for Healthcare Providers: GravelBags.it  This test is not yet approved or cleared by the [place] FDA and  has been authorized for detection and/or diagnosis of SARS-CoV-2 by  FDA under an Emergency Use Authorization (EUA). This EUA will remain  in  effect (meaning this test can be used) for the duration of the  Covid-19 declaration under Section 564(b)(1) of the Act, 21  U.S.C. section 360bbb-3(b)(1), unless the authorization is  terminated or revoked. Performed at [facility], [address] [place],  [place] [address]      Time coordinating discharge: 25  minutes  SIGNED: Antonieta Pert, MD  [facility] 01/15/2020, 3:51 PM  If 7PM-7AM, please contact night-coverage [email or web address]

## 2020-01-15 NOTE — Consult Note (Signed)
Palliative care consult note  Reason for consult: Goals of care/symptom management  Palliative care consult received. Chart reviewed including personal review of pertinent labs and imaging. Discussed patient with attending, Dr. Lupita Leash, as well as liaison from [employer].  Briefly, Bonnie Peterson is a 74 year old female with past medical history of metastatic cholangiocarcinoma who recently enrolled in hospice support but presented from home with increased nausea and vomiting last several days. Imaging that was done recently showed stage IV disease with progression and decision was made for pursuing home hospice. Over the past several days, however, she has not been able to tolerate antiemetics or much oral intake. There was concern from hospice team that she had bowel obstruction and she was brought to the hospital for evaluation. CT negative for bowel obstruction. Palliative consulted for goals/symptom management, particularly nausea.  I met today with Bonnie Peterson. She is a very pleasant female who is sitting in bed in no distress at my time of my encounter. She reports feeling much better than she did on arrival to the ED after getting fluids, pain medication, and antiemetics.  We discussed symptoms that brought her into the hospital. She reports that at home she has been unable to eat or drink much as she will have some intake but then develop a dull pain in the right upper quadrant that then progresses to a sharp pain and is followed by vomiting. The pain gets worse with intake. She has not found any relieving factors for it at home. In talking with her, it does not appear she has been taking antiemetics regularly at home. Reports that family has been working to minimize medication at home due to concern that it will make her too sleepy. I discussed with her regarding antiemetics and what appears to be early satiety from her description. We discussed using scheduled Reglan 4 times a day and waiting  until after she has the Reglan to try to eat a meal. We also discussed addition of other medications (Haldol and Ativan) for when symptoms became severe. Discussed that these would, side effect of sleepiness, however taking between sleepiness and nausea, there will be time for sleepiness would be preferable.   Shortly after I finished my conversation with her, her daughter, Bonnie Peterson, arrived to visit her.  I then met with Bonnie Peterson and we also added her other daughter, Bonnie Peterson (whom she lives with), the conversation via phone as well.  We again discussed that she has terminal illness that will continue to progress and the goal moving forward is to give her the best quality of life as possible.  Discussed continued changes in her nutrition and functional status.  I spent a long time with family reviewing the importance of using antiemetics on a regular basis rather than waiting until symptoms get out of control.  I stressed with her early satiety and vomiting after meals, she really needs to have Reglan on a scheduled basis and would probably be able to tolerate the most intake if she tries 30 minutes after taking Reglan to have her meals.  We also discussed utilization of other agents (Haldol and Ativan) for refractory symptoms.  At end of conversation, Bonnie Peterson was clear that she wants to transition home today.  I will reach out discussed with hospice liaison again and I will send prescription for new medications to [facility] on [name] per family request.  -DNR/DNI -Plan is to transition back home with hospice support. -I spent a long time discussing antiemetics and  symptom management with family. -On discharge plan for scheduled Reglan 4 times daily and concentrating her times of intake 30 minutes after taking Reglan. -Also made addition of Haldol and Ativan to be used as needed for refractory symptoms.  Discussed side effects of these with family including sleepiness. -Please call if there are other specific  areas with which we can be of assistance in the care of Bonnie Peterson -Prescription for Reglan, Haldol, and Ativan were sent to requested pharmacy.  Start time: 0940 End time: 1100 Total time: 80 minutes  Greater than 50%  of this time was spent counseling and coordinating care related to the above assessment and plan.  Micheline Rough, MD [facility] [phone]

## 2020-01-15 NOTE — Progress Notes (Signed)
Palliative care to see pt and also spoke with family about plan of care. Hospitalist also saw pt. Pt overall doing well with no c/o at this time. Family remains at bedside. Pt to hopefully d/c home today.

## 2020-01-15 NOTE — Progress Notes (Signed)
Pt stable at time of discharge instructions and education given. No needs or change in overall condition. Pt port a cath deaccessed prior to pt d/c. Pt awaiting ride home.

## 2020-01-15 NOTE — Plan of Care (Signed)
  Problem: Clinical Measurements: Goal: Respiratory complications will improve Outcome: Progressing   Problem: Coping: Goal: Level of anxiety will decrease Outcome: Progressing   Problem: Pain Managment: Goal: General experience of comfort will improve Outcome: Progressing   Problem: Safety: Goal: Ability to remain free from injury will improve Outcome: Progressing   Problem: Coping: Goal: Level of anxiety will decrease Outcome: Progressing

## 2020-01-15 NOTE — Plan of Care (Signed)
Pt stable at this time. Pt d/c home with family.

## 2020-01-15 NOTE — Progress Notes (Signed)
Pt tolerating ice cream and jello well. No c/o nausea and pt in good spirits overall. No needs at this time. Pt hoping to go home today. Hospice to see pt prior to d/c.

## 2020-01-15 NOTE — Progress Notes (Signed)
Manufacturing [employer] [facility]) liaison note  Bonnie Peterson is a current pt with [facility] hospice care at home.  Report has been exchanged with Dr. Domingo Cocking and chart reviewed.  Bonnie Peterson can be discharged without a visit in the hospital by [facility] liaison.   A hospice RN will visit at home within 24 hours of discharge to ensure good symptom management and continuation of care.  Please reach out with any concerns.  Domenic Moras, BSN, RN [facility] liaison [phone] (24h on call)

## 2020-01-15 NOTE — Progress Notes (Signed)
   01/15/20 0110  Vitals  Temp 98.5 F (36.9 C)  Temp Source Oral  BP (!) 189/70  MAP (mmHg) 105  BP Location Left Arm  BP Method Automatic  Patient Position (if appropriate) Lying  Pulse Rate 71  Resp 14  MEWS COLOR  MEWS Score Color Green  Oxygen Therapy  SpO2 97 %  O2 Device Room Air  MEWS Score  MEWS Temp 0  MEWS Systolic 0  MEWS Pulse 0  MEWS RR 0  MEWS LOC 0  MEWS Score 0  BP is 189/70, pt is asymptomatic and is resting well, MD notified. Will recheck the blood pressure in 1 hour, continue to monitor.

## 2020-01-20 ENCOUNTER — Other Ambulatory Visit: Payer: Medicare HMO

## 2020-01-26 ENCOUNTER — Ambulatory Visit: Payer: Medicare HMO | Admitting: Hematology

## 2020-02-14 ENCOUNTER — Other Ambulatory Visit: Payer: Self-pay

## 2020-02-14 ENCOUNTER — Encounter (HOSPITAL_COMMUNITY): Payer: Self-pay | Admitting: Internal Medicine

## 2020-02-14 ENCOUNTER — Inpatient Hospital Stay (HOSPITAL_COMMUNITY)
Admission: EM | Admit: 2020-02-14 | Discharge: 2020-02-23 | DRG: 380 | Disposition: A | Discharge status: Hospice | Attending: Internal Medicine | Admitting: Internal Medicine

## 2020-02-14 DIAGNOSIS — Z66 Do not resuscitate: Secondary | ICD-10-CM | POA: Diagnosis present

## 2020-02-14 DIAGNOSIS — Z8261 Family history of arthritis: Secondary | ICD-10-CM

## 2020-02-14 DIAGNOSIS — Z79899 Other long term (current) drug therapy: Secondary | ICD-10-CM

## 2020-02-14 DIAGNOSIS — E43 Unspecified severe protein-calorie malnutrition: Secondary | ICD-10-CM | POA: Diagnosis present

## 2020-02-14 DIAGNOSIS — Z4659 Encounter for fitting and adjustment of other gastrointestinal appliance and device: Secondary | ICD-10-CM | POA: Diagnosis not present

## 2020-02-14 DIAGNOSIS — K56609 Unspecified intestinal obstruction, unspecified as to partial versus complete obstruction: Secondary | ICD-10-CM | POA: Diagnosis not present

## 2020-02-14 DIAGNOSIS — E785 Hyperlipidemia, unspecified: Secondary | ICD-10-CM | POA: Diagnosis present

## 2020-02-14 DIAGNOSIS — I10 Essential (primary) hypertension: Secondary | ICD-10-CM | POA: Diagnosis present

## 2020-02-14 DIAGNOSIS — R112 Nausea with vomiting, unspecified: Secondary | ICD-10-CM | POA: Diagnosis present

## 2020-02-14 DIAGNOSIS — E119 Type 2 diabetes mellitus without complications: Secondary | ICD-10-CM | POA: Diagnosis present

## 2020-02-14 DIAGNOSIS — C221 Intrahepatic bile duct carcinoma: Secondary | ICD-10-CM | POA: Diagnosis present

## 2020-02-14 DIAGNOSIS — K2100 Gastro-esophageal reflux disease with esophagitis, without bleeding: Secondary | ICD-10-CM | POA: Diagnosis present

## 2020-02-14 DIAGNOSIS — Z9089 Acquired absence of other organs: Secondary | ICD-10-CM | POA: Diagnosis not present

## 2020-02-14 DIAGNOSIS — M5136 Other intervertebral disc degeneration, lumbar region: Secondary | ICD-10-CM | POA: Diagnosis not present

## 2020-02-14 DIAGNOSIS — K2970 Gastritis, unspecified, without bleeding: Secondary | ICD-10-CM | POA: Diagnosis present

## 2020-02-14 DIAGNOSIS — Z8505 Personal history of malignant neoplasm of liver: Secondary | ICD-10-CM | POA: Diagnosis not present

## 2020-02-14 DIAGNOSIS — E876 Hypokalemia: Secondary | ICD-10-CM | POA: Diagnosis present

## 2020-02-14 DIAGNOSIS — Z7901 Long term (current) use of anticoagulants: Secondary | ICD-10-CM

## 2020-02-14 DIAGNOSIS — K209 Esophagitis, unspecified without bleeding: Secondary | ICD-10-CM

## 2020-02-14 DIAGNOSIS — M810 Age-related osteoporosis without current pathological fracture: Secondary | ICD-10-CM | POA: Diagnosis present

## 2020-02-14 DIAGNOSIS — Z8249 Family history of ischemic heart disease and other diseases of the circulatory system: Secondary | ICD-10-CM

## 2020-02-14 DIAGNOSIS — J439 Emphysema, unspecified: Secondary | ICD-10-CM | POA: Diagnosis not present

## 2020-02-14 DIAGNOSIS — K219 Gastro-esophageal reflux disease without esophagitis: Secondary | ICD-10-CM | POA: Diagnosis not present

## 2020-02-14 DIAGNOSIS — R1110 Vomiting, unspecified: Secondary | ICD-10-CM

## 2020-02-14 DIAGNOSIS — Z515 Encounter for palliative care: Secondary | ICD-10-CM

## 2020-02-14 DIAGNOSIS — Z87891 Personal history of nicotine dependence: Secondary | ICD-10-CM | POA: Diagnosis not present

## 2020-02-14 DIAGNOSIS — K2210 Ulcer of esophagus without bleeding: Secondary | ICD-10-CM | POA: Diagnosis not present

## 2020-02-14 DIAGNOSIS — Z7189 Other specified counseling: Secondary | ICD-10-CM | POA: Diagnosis not present

## 2020-02-14 DIAGNOSIS — K589 Irritable bowel syndrome without diarrhea: Secondary | ICD-10-CM | POA: Diagnosis present

## 2020-02-14 DIAGNOSIS — K311 Adult hypertrophic pyloric stenosis: Principal | ICD-10-CM | POA: Diagnosis present

## 2020-02-14 DIAGNOSIS — Z800 Family history of malignant neoplasm of digestive organs: Secondary | ICD-10-CM | POA: Diagnosis not present

## 2020-02-14 DIAGNOSIS — K3189 Other diseases of stomach and duodenum: Secondary | ICD-10-CM | POA: Diagnosis not present

## 2020-02-14 DIAGNOSIS — Z7983 Long term (current) use of bisphosphonates: Secondary | ICD-10-CM | POA: Diagnosis not present

## 2020-02-14 DIAGNOSIS — Z803 Family history of malignant neoplasm of breast: Secondary | ICD-10-CM

## 2020-02-14 DIAGNOSIS — I959 Hypotension, unspecified: Secondary | ICD-10-CM | POA: Diagnosis not present

## 2020-02-14 DIAGNOSIS — I712 Thoracic aortic aneurysm, without rupture: Secondary | ICD-10-CM | POA: Diagnosis not present

## 2020-02-14 DIAGNOSIS — J449 Chronic obstructive pulmonary disease, unspecified: Secondary | ICD-10-CM | POA: Diagnosis present

## 2020-02-14 DIAGNOSIS — R64 Cachexia: Secondary | ICD-10-CM

## 2020-02-14 DIAGNOSIS — Z923 Personal history of irradiation: Secondary | ICD-10-CM

## 2020-02-14 DIAGNOSIS — C787 Secondary malignant neoplasm of liver and intrahepatic bile duct: Secondary | ICD-10-CM | POA: Diagnosis present

## 2020-02-14 DIAGNOSIS — D696 Thrombocytopenia, unspecified: Secondary | ICD-10-CM

## 2020-02-14 DIAGNOSIS — C240 Malignant neoplasm of extrahepatic bile duct: Secondary | ICD-10-CM | POA: Diagnosis not present

## 2020-02-14 DIAGNOSIS — Z6823 Body mass index (BMI) 23.0-23.9, adult: Secondary | ICD-10-CM | POA: Diagnosis not present

## 2020-02-14 DIAGNOSIS — D171 Benign lipomatous neoplasm of skin and subcutaneous tissue of trunk: Secondary | ICD-10-CM | POA: Diagnosis not present

## 2020-02-14 DIAGNOSIS — I2510 Atherosclerotic heart disease of native coronary artery without angina pectoris: Secondary | ICD-10-CM | POA: Diagnosis not present

## 2020-02-14 DIAGNOSIS — Z833 Family history of diabetes mellitus: Secondary | ICD-10-CM

## 2020-02-14 DIAGNOSIS — Z20822 Contact with and (suspected) exposure to covid-19: Secondary | ICD-10-CM | POA: Diagnosis present

## 2020-02-14 DIAGNOSIS — R531 Weakness: Secondary | ICD-10-CM | POA: Diagnosis not present

## 2020-02-14 DIAGNOSIS — R52 Pain, unspecified: Secondary | ICD-10-CM | POA: Diagnosis not present

## 2020-02-14 DIAGNOSIS — K7689 Other specified diseases of liver: Secondary | ICD-10-CM | POA: Diagnosis not present

## 2020-02-14 DIAGNOSIS — F419 Anxiety disorder, unspecified: Secondary | ICD-10-CM | POA: Diagnosis present

## 2020-02-14 DIAGNOSIS — Z7984 Long term (current) use of oral hypoglycemic drugs: Secondary | ICD-10-CM

## 2020-02-14 DIAGNOSIS — D6959 Other secondary thrombocytopenia: Secondary | ICD-10-CM | POA: Diagnosis present

## 2020-02-14 DIAGNOSIS — I81 Portal vein thrombosis: Secondary | ICD-10-CM | POA: Diagnosis not present

## 2020-02-14 DIAGNOSIS — Z9049 Acquired absence of other specified parts of digestive tract: Secondary | ICD-10-CM

## 2020-02-14 LAB — CBC
HCT: 39.5 % (ref 36.0–46.0)
Hemoglobin: 12.2 g/dL (ref 12.0–15.0)
MCH: 29.1 pg (ref 26.0–34.0)
MCHC: 30.9 g/dL (ref 30.0–36.0)
MCV: 94.3 fL (ref 80.0–100.0)
Platelets: 136 10*3/uL — ABNORMAL LOW (ref 150–400)
RBC: 4.19 MIL/uL (ref 3.87–5.11)
RDW: 15 % (ref 11.5–15.5)
WBC: 5.7 10*3/uL (ref 4.0–10.5)
nRBC: 0 % (ref 0.0–0.2)

## 2020-02-14 LAB — COMPREHENSIVE METABOLIC PANEL
ALT: 17 U/L (ref 0–44)
AST: 34 U/L (ref 15–41)
Albumin: 3.1 g/dL — ABNORMAL LOW (ref 3.5–5.0)
Alkaline Phosphatase: 78 U/L (ref 38–126)
Anion gap: 14 (ref 5–15)
BUN: 20 mg/dL (ref 8–23)
CO2: 33 mmol/L — ABNORMAL HIGH (ref 22–32)
Calcium: 8.8 mg/dL — ABNORMAL LOW (ref 8.9–10.3)
Chloride: 98 mmol/L (ref 98–111)
Creatinine, Ser: 0.73 mg/dL (ref 0.44–1.00)
GFR, Estimated: >60 mL/min (ref >60)
Glucose, Bld: 103 mg/dL — ABNORMAL HIGH (ref 70–99)
Potassium: 3 mmol/L — ABNORMAL LOW (ref 3.5–5.1)
Sodium: 145 mmol/L (ref 135–145)
Total Bilirubin: 1.2 mg/dL (ref 0.3–1.2)
Total Protein: 6.9 g/dL (ref 6.5–8.1)

## 2020-02-14 LAB — LIPASE, BLOOD: Lipase: 20 U/L (ref 11–51)

## 2020-02-14 LAB — MAGNESIUM: Magnesium: 2.2 mg/dL (ref 1.7–2.4)

## 2020-02-14 LAB — RESPIRATORY PANEL BY RT PCR (FLU A&B, COVID)
Influenza A by PCR: NEGATIVE
Influenza B by PCR: NEGATIVE
SARS Coronavirus 2 by RT PCR: NEGATIVE

## 2020-02-14 MED ORDER — SODIUM CHLORIDE 0.9 % IV SOLN: INTRAVENOUS | Status: DC

## 2020-02-14 MED ORDER — METOCLOPRAMIDE HCL 5 MG/ML IJ SOLN: 5.0000 mg | Freq: Three times a day (TID) | INTRAMUSCULAR | Status: DC | PRN

## 2020-02-14 MED ORDER — MORPHINE SULFATE (PF) 4 MG/ML IV SOLN
4.0000 mg | Freq: Once | INTRAVENOUS | Status: AC
  Administered 2020-02-14: 4 mg via INTRAVENOUS
  Filled 2020-02-14: qty 1

## 2020-02-14 MED ORDER — METOCLOPRAMIDE HCL 5 MG/ML IJ SOLN
10.0000 mg | Freq: Once | INTRAMUSCULAR | Status: AC
  Administered 2020-02-14: 10 mg via INTRAVENOUS
  Filled 2020-02-14: qty 2

## 2020-02-14 MED ORDER — SODIUM CHLORIDE 0.9 % IV BOLUS
1000.0000 mL | Freq: Once | INTRAVENOUS | Status: AC
  Administered 2020-02-14: 1000 mL via INTRAVENOUS

## 2020-02-14 MED ORDER — POTASSIUM CHLORIDE 10 MEQ/100ML IV SOLN
10.0000 meq | INTRAVENOUS | Status: AC
  Administered 2020-02-14 (×4): 10 meq via INTRAVENOUS
  Filled 2020-02-14 (×4): qty 100

## 2020-02-14 NOTE — H&P (Signed)
History and Physical    Bonnie Peterson:[id number] DOB: 1945-12-18 DOA: 02/14/2020  PCP: Lattie Haw, MD  Patient coming from: Home  Chief Complaint: Intractable N/V  HPI: Bonnie Peterson is a 74 y.o. female with medical history significant of intrahepatic cholangiocarcinoma w/ mets. Presenting with intractable N/V. Family reports that patient has had 3 weeks of N/V that has not been controlled by her home meds (reglan, scopolamine, phenergan). She has been unable to keep any food or liquids down. They became concerned and came back to the ED.   She has had multiple recent ED visits for the same. She is on home hospice, but not for comfort care. She has hospice for services.   ED Course: Patient/family seen by oncology. Reinforced that there is no curative option. Recommended ON obs for hydration and home with hospice.   Review of Systems:  Review of systems is otherwise negative for all not mentioned in HPI.   PMHx Past Medical History:  Diagnosis Date  . Anxiety   . Benign positional vertigo   . Cancer ([place]) 02/09/15   intrahepatic cholangiocarcinoma  . COPD (chronic obstructive pulmonary disease) ([place])   . Depression   . Diabetes mellitus without complication ([place])   . Early cataracts, bilateral 10/13   Optho, Dr Gershon Crane  . Echocardiogram findings abnormal, without diagnosis 10/10   10/10: mild pulm HTN, EF 60-65%, mild LVH, moderate aortic regurg  . GERD (gastroesophageal reflux disease)    I have "acid reflux"  . Gout   . HLD (hyperlipidemia)   . HTN, goal below 130/80   . Irritable bowel syndrome   . Obesity, Class III, BMI 40-49.9 (morbid obesity) ([place])   . Obstructive sleep apnea    wears cpap  . Osteoarthritis (arthritis due to wear and tear of joints)    also gout  . Restrictive lung disease     PSHx Past Surgical History:  Procedure Laterality Date  . ABDOMINAL HYSTERECTOMY    . COLONOSCOPY WITH PROPOFOL N/A 08/12/2019   Procedure: COLONOSCOPY WITH  PROPOFOL;  Surgeon: [name], MD;  Location: [facility] ENDOSCOPY;  Service: Endoscopy;  Laterality: N/A;  . ESOPHAGOGASTRODUODENOSCOPY N/A 06/15/2015   Procedure: ESOPHAGOGASTRODUODENOSCOPY (EGD);  Surgeon: Gatha Mayer, MD;  Location: [facility];  Service: Endoscopy;  Laterality: N/A;  . IR IMAGING GUIDED PORT INSERTION  06/15/2019  . IR REMOVAL TUN ACCESS W/ PORT W/O FL MOD SED  11/11/2016  . LIVER BIOPSY    . OPEN PARTIAL HEPATECTOMY  Left 02/09/15  . PARTIAL HYSTERECTOMY    . ROTATOR CUFF REPAIR       L rotator cuff repair 11/07-[place] - 12/7/200  . TONSILLECTOMY      SocHx  reports that she quit smoking about 22 months ago. Her smoking use included cigarettes. She has a 24.50 pack-year smoking history. She has never used smokeless tobacco. She reports current alcohol use. She reports current drug use. Drug: Marijuana.  Allergies  Allergen Reactions  . Other     Propofol - Anesthesia for MRI--made her "breathe funny"  . Propofol Other (See Comments)    "It makes me feel like I'm on fire."    FamHx Family History  Problem Relation Age of Onset  . Breast cancer Mother   . Hypertension Mother   . Coronary artery disease Mother   . Diabetes type II Mother   . Rheum arthritis Mother   . Diabetes type II Sister   . Pancreatic cancer Sister  Prior to Admission medications   Medication Sig Start Date End Date Taking? Authorizing Provider  albuterol (PROAIR HFA) 108 (90 Base) MCG/ACT inhaler Inhale 1-2 puffs into the lungs every 6 (six) hours as needed for wheezing or shortness of breath. 07/23/19  Yes Parrett, Tammy S, NP  alendronate (FOSAMAX) 70 MG tablet TAKE 1 TABLET  ONCE A WEEK. Patient taking differently: Take 70 mg by mouth every Sunday. TAKE 1 TABLET  ONCE A WEEK. 03/02/19  Yes Lattie Haw, MD  amLODipine (NORVASC) 10 MG tablet TAKE 1 TABLET EVERY DAY Patient taking differently: Take 10 mg by mouth daily.  06/03/19  Yes Lattie Haw, MD  atenolol (TENORMIN) 25 MG  tablet Take 1 tablet (25 mg total) by mouth daily. 01/08/19  Yes Lattie Haw, MD  cholecalciferol (VITAMIN D) 1000 units tablet Take 1 tablet (1,000 Units total) by mouth daily. 01/03/17  Yes Bufford Lope, DO  dicyclomine (BENTYL) 20 MG tablet Take 1 tablet (20 mg total) by mouth 2 (two) times daily as needed for spasms (abdominal pain). 01/11/20  Yes Gareth Morgan, MD  famotidine (PEPCID) 20 MG tablet Take 1 tablet (20 mg total) by mouth 2 (two) times daily. Patient taking differently: Take 20 mg by mouth 2 (two) times daily as needed for heartburn.  06/26/19  Yes Lattie Haw, MD  fish oil-omega-3 fatty acids 1000 MG capsule Take 1 g by mouth once a week.    Yes [provider]  fluticasone (FLONASE) 50 MCG/ACT nasal spray Place 2 sprays into both nostrils daily as needed for rhinitis. 04/17/16  Yes Haney, Alyssa A, MD  diclofenac sodium (VOLTAREN) 1 % GEL Apply 2 g topically 4 (four) times daily as needed. Patient not taking: Reported on 02/14/2020 01/26/18   Bufford Lope, DO  haloperidol (HALDOL) 0.5 MG tablet Take 1 tablet (0.5 mg total) by mouth every 6 (six) hours as needed for agitation (or nausea). 01/15/20   Micheline Rough, MD  hydrochlorothiazide (HYDRODIURIL) 25 MG tablet Take 1 tablet (25 mg total) by mouth daily. 03/02/19   Lattie Haw, MD  Hyprom-Naphaz-Polysorb-Zn Sulf (CLEAR EYES COMPLETE OP) Place 1 drop into both eyes daily as needed (dry eyes).     [provider]  lidocaine-prilocaine (EMLA) cream Apply 1 application topically every 14 (fourteen) days. Every 14 days as needed Patient taking differently: Apply 1 application topically as needed (port access).  06/17/19   Truitt Merle, MD  lisinopril (ZESTRIL) 40 MG tablet Take 1 tablet (40 mg total) by mouth daily. 03/02/19   Lattie Haw, MD  loratadine (CLARITIN) 10 MG tablet Take 1 tablet (10 mg total) by mouth daily as needed for allergies. 09/23/12   Waldemar Dickens, MD  LORazepam (ATIVAN) 0.5 MG tablet Take 1  tablet (0.5 mg total) by mouth every 4 (four) hours as needed for anxiety (or nausea that is not controlled by other medications). 01/15/20   Micheline Rough, MD  LORazepam (ATIVAN) 1 MG tablet Take 1 mg by mouth daily as needed. 01/26/20   [provider]  metFORMIN (GLUCOPHAGE) 500 MG tablet TAKE 2 TABLETS TWICE DAILY WITH MEALS Patient taking differently: Take 1,000 mg by mouth 2 (two) times daily with a meal. TAKE 2 TABLETS TWICE DAILY WITH MEALS 03/02/19   Lattie Haw, MD  metoCLOPramide (REGLAN) 10 MG tablet Take 10 mg by mouth 4 (four) times daily. 01/25/20   [provider]  metoCLOPramide (REGLAN) 5 MG tablet Take 1 tablet (5 mg total) by mouth 4 (four)  times daily -  before meals and at bedtime. 01/15/20   Micheline Rough, MD  mirtazapine (REMERON SOL-TAB) 15 MG disintegrating tablet Take 1 tablet (15 mg total) by mouth at bedtime. 01/06/20 01/05/21  Truitt Merle, MD  Multiple Vitamin (MULTIVITAMIN WITH MINERALS) TABS tablet Take 1 tablet by mouth daily.    [provider]  Na Sulfate-K Sulfate-Mg Sulf (SUPREP BOWEL PREP KIT) 17.5-3.13-1.6 GM/177ML SOLN Take 1 kit by mouth as directed. Patient not taking: Reported on 01/15/2020 07/21/19   [name], MD  ondansetron (ZOFRAN ODT) 8 MG disintegrating tablet Take 1 tablet (8 mg total) by mouth every 8 (eight) hours as needed for nausea or vomiting. 01/12/20   Tanner, Lyndon Code., PA-C  ondansetron (ZOFRAN) 4 MG tablet Take 4 mg by mouth every 8 (eight) hours as needed for nausea or vomiting.    [provider]  oxyCODONE (OXY IR/ROXICODONE) 5 MG immediate release tablet Take 0.5-1 tablets (2.5-5 mg total) by mouth every 8 (eight) hours as needed for severe pain. 01/06/20   Truitt Merle, MD  Oxycodone HCl 10 MG TABS Take 10 mg by mouth 4 (four) times daily as needed. 01/26/20   [provider]  potassium chloride (KLOR-CON) 10 MEQ tablet Take 1 tablet (10 mEq total) by mouth daily. 01/10/20   Truitt Merle, MD  Probiotic  Product (PROBIOTIC PO) Take 1 capsule by mouth daily.    [provider]  promethazine (PHENERGAN) 25 MG tablet Take 25 mg by mouth every 6 (six) hours as needed. 02/03/20   [provider]  RIVAROXABAN Alveda Reasons) VTE STARTER PACK (15 & 20 MG TABLETS) Follow package directions: Take one 18m tablet by mouth twice a day. On day 22, switch to one 23mtablet once a day. Take with food. 08/26/19   FeTruitt MerleMD  scopolamine (TRANSDERM-SCOP) 1 MG/3DAYS 1 patch every 3 (three) days. 02/06/20   [provider]  traMADol (ULTRAM) 50 MG tablet Take 1 tablet (50 mg total) by mouth every 6 (six) hours as needed. Patient not taking: Reported on 01/15/2020 12/20/19   FeTruitt MerleMD  VITAMIN E PO Take 1 tablet by mouth See admin instructions. 2-3 times a month    [provider]    Physical Exam: Vitals:   02/14/20 1500 02/14/20 1530 02/14/20 1600 02/14/20 1630  BP: (!) 163/74 (!) 196/67 (!) 191/68 (!) 152/79  Pulse: 63 63 64 63  Resp: 14 18 19 16   Temp:      TempSrc:      SpO2: 100% 100% 100% 99%    General: [age].o. ill appearing female resting in bed Eyes: PERRL, normal sclera ENMT: Nares patent w/o discharge, orophaynx clear, dentition normal, ears w/o discharge/lesions/ulcers Neck: Supple, trachea midline Cardiovascular: RRR, +S1, S2, no m/g/r, equal pulses throughout Respiratory: CTABL, no w/r/r, normal WOB GI: BS+, NDNT, no masses noted, no organomegaly noted MSK: No e/c/c Skin: No rashes, bruises, ulcerations noted Neuro: A&O x 3, no focal deficits Psyc: Appropriate interaction and affect, calm/cooperative  Labs on Admission: I have personally reviewed following labs and imaging studies  CBC: Recent Labs  Lab 02/14/20 1112  WBC 5.7  HGB 12.2  HCT 39.5  MCV 94.3  PLT 13492  Basic Metabolic Panel: Recent Labs  Lab 02/14/20 1112  NA 145  K 3.0*  CL 98  CO2 33*  GLUCOSE 103*  BUN 20  CREATININE 0.73  CALCIUM 8.8*   GFR: CrCl cannot be  calculated (Unknown ideal weight.). Liver  Function Tests: Recent Labs  Lab 02/14/20 1112  AST 34  ALT 17  ALKPHOS 78  BILITOT 1.2  PROT 6.9  ALBUMIN 3.1*   Recent Labs  Lab 02/14/20 1112  LIPASE 20   No results for input(s): AMMONIA in the last 168 hours. Coagulation Profile: No results for input(s): INR, PROTIME in the last 168 hours. Cardiac Enzymes: No results for input(s): CKTOTAL, CKMB, CKMBINDEX, TROPONINI in the last 168 hours. BNP (last 3 results) No results for input(s): PROBNP in the last 8760 hours. HbA1C: No results for input(s): HGBA1C in the last 72 hours. CBG: No results for input(s): GLUCAP in the last 168 hours. Lipid Profile: No results for input(s): CHOL, HDL, LDLCALC, TRIG, CHOLHDL, LDLDIRECT in the last 72 hours. Thyroid Function Tests: No results for input(s): TSH, T4TOTAL, FREET4, T3FREE, THYROIDAB in the last 72 hours. Anemia Panel: No results for input(s): VITAMINB12, FOLATE, FERRITIN, TIBC, IRON, RETICCTPCT in the last 72 hours. Urine analysis:    Component Value Date/Time   COLORURINE YELLOW 01/11/2020 0956   APPEARANCEUR CLEAR 01/11/2020 0956   LABSPEC >1.046 (H) 01/11/2020 0956   PHURINE 6.0 01/11/2020 0956   GLUCOSEU NEGATIVE 01/11/2020 0956   HGBUR NEGATIVE 01/11/2020 0956   BILIRUBINUR NEGATIVE 01/11/2020 0956   BILIRUBINUR negative 01/04/2013 1530   KETONESUR 80 (A) 01/11/2020 0956   PROTEINUR 30 (A) 01/11/2020 0956   UROBILINOGEN 0.2 10/09/2013 2037   NITRITE NEGATIVE 01/11/2020 0956   LEUKOCYTESUR NEGATIVE 01/11/2020 0956    Radiological Exams on Admission: No results found.  Assessment/Plan Intractable N/V secondary to widely metastatic cholangiocarcinoma     - admit to obs, med-surg     - no curative option     - will get fluids for hydration ON and antiemetics     - have again discussed at length the prognosis of this patient's condition with family; family does not appear to understand or accept her reality     - ED  and onco have also had this conversation     - can ask PC for help, but may need Ethics     - in any case, patient wants to eat; allow her to pleasure feed  Hypokalemia     - replace K+, check Mg2+  HTN     - PRN meds  Thrombocytopenia     - no obvious bleed, follow  DVT prophylaxis: SCDs  Code Status: DNR, confirmed with dtr on phone  Family Communication: With dtr by phone  Consults called: EDP spoke with Onco   Status is: Observation  The patient remains OBS appropriate and will d/c before 2 midnights.  Dispo: The patient is from: Home              Anticipated d/c is to: Home              Anticipated d/c date is: 1 day              Patient currently is not medically stable to d/c.  Jonnie Finner DO [facility]  If 7PM-7AM, please contact night-coverage [email or web address]  02/14/2020, 5:12 PM

## 2020-02-14 NOTE — ED Provider Notes (Signed)
[facility] EMERGENCY DEPARTMENT Provider Note  CSN: [id number] Arrival date & time: 02/14/20 1043    History Chief Complaint  Patient presents with  . Emesis    HPI  Bonnie Peterson is a 74 y.o. female with history of intrahepatic cholangiocarcinoma with recent admission for abdominal pain and vomiting, found to have mass-like thickening of stomach and proximal duodenum likely from prior radiation treatment. She was seen by Palliative care and local Oncology, no further aggressive treatments or interventions were recommended and she was discharged home with Hospice. She has been in frequent contact with Hospice in the Home and has been on numerous different antiemetics without much improvement. Daughter called her oncologist's office at [facility] today and per their note, the daughter mentioned that the patient 'still has life in her' and does not want hospice.  Past Medical History:  Diagnosis Date  . Anxiety   . Benign positional vertigo   . Cancer ([place]) 02/09/15   intrahepatic cholangiocarcinoma  . COPD (chronic obstructive pulmonary disease) ([place])   . Depression   . Diabetes mellitus without complication ([place])   . Early cataracts, bilateral 10/13   Optho, Dr Gershon Crane  . Echocardiogram findings abnormal, without diagnosis 10/10   10/10: mild pulm HTN, EF 60-65%, mild LVH, moderate aortic regurg  . GERD (gastroesophageal reflux disease)    I have "acid reflux"  . Gout   . HLD (hyperlipidemia)   . HTN, goal below 130/80   . Irritable bowel syndrome   . Obesity, Class III, BMI 40-49.9 (morbid obesity) ([place])   . Obstructive sleep apnea    wears cpap  . Osteoarthritis (arthritis due to wear and tear of joints)    also gout  . Restrictive lung disease     Past Surgical History:  Procedure Laterality Date  . ABDOMINAL HYSTERECTOMY    . COLONOSCOPY WITH PROPOFOL N/A 08/12/2019   Procedure: COLONOSCOPY WITH PROPOFOL;  Surgeon: [name], MD;  Location: [facility];   Service: Endoscopy;  Laterality: N/A;  . ESOPHAGOGASTRODUODENOSCOPY N/A 06/15/2015   Procedure: ESOPHAGOGASTRODUODENOSCOPY (EGD);  Surgeon: Gatha Mayer, MD;  Location: [facility];  Service: Endoscopy;  Laterality: N/A;  . IR IMAGING GUIDED PORT INSERTION  06/15/2019  . IR REMOVAL TUN ACCESS W/ PORT W/O FL MOD SED  11/11/2016  . LIVER BIOPSY    . OPEN PARTIAL HEPATECTOMY  Left 02/09/15  . PARTIAL HYSTERECTOMY    . ROTATOR CUFF REPAIR       L rotator cuff repair 11/07-[place] - 12/7/200  . TONSILLECTOMY      Family History  Problem Relation Age of Onset  . Breast cancer Mother   . Hypertension Mother   . Coronary artery disease Mother   . Diabetes type II Mother   . Rheum arthritis Mother   . Diabetes type II Sister   . Pancreatic cancer Sister     Social History   Tobacco Use  . Smoking status: Former Smoker    Packs/day: 0.50    Years: 49.00    Pack years: 24.50    Types: Cigarettes    Quit date: 04/13/2018    Years since quitting: 1.8  . Smokeless tobacco: Never Used  Vaping Use  . Vaping Use: Never used  Substance Use Topics  . Alcohol use: Yes    Alcohol/week: 0.0 standard drinks    Comment: occasionally  . Drug use: Yes    Types: Marijuana     Home Medications Prior to Admission medications  Medication Sig Start Date End Date Taking? Authorizing Provider  albuterol (PROAIR HFA) 108 (90 Base) MCG/ACT inhaler Inhale 1-2 puffs into the lungs every 6 (six) hours as needed for wheezing or shortness of breath. 07/23/19  Yes Parrett, Tammy S, NP  alendronate (FOSAMAX) 70 MG tablet TAKE 1 TABLET  ONCE A WEEK. Patient taking differently: Take 70 mg by mouth every Sunday. TAKE 1 TABLET  ONCE A WEEK. 03/02/19  Yes Lattie Haw, MD  amLODipine (NORVASC) 10 MG tablet TAKE 1 TABLET EVERY DAY Patient taking differently: Take 10 mg by mouth daily.  06/03/19  Yes Lattie Haw, MD  atenolol (TENORMIN) 25 MG tablet Take 1 tablet (25 mg total) by mouth daily. 01/08/19  Yes Lattie Haw, MD  cholecalciferol (VITAMIN D) 1000 units tablet Take 1 tablet (1,000 Units total) by mouth daily. 01/03/17  Yes Bufford Lope, DO  dicyclomine (BENTYL) 20 MG tablet Take 1 tablet (20 mg total) by mouth 2 (two) times daily as needed for spasms (abdominal pain). 01/11/20  Yes Gareth Morgan, MD  famotidine (PEPCID) 20 MG tablet Take 1 tablet (20 mg total) by mouth 2 (two) times daily. Patient taking differently: Take 20 mg by mouth 2 (two) times daily as needed for heartburn.  06/26/19  Yes Lattie Haw, MD  fish oil-omega-3 fatty acids 1000 MG capsule Take 1 g by mouth once a week.    Yes [provider]  fluticasone (FLONASE) 50 MCG/ACT nasal spray Place 2 sprays into both nostrils daily as needed for rhinitis. 04/17/16  Yes Haney, Alyssa A, MD  diclofenac sodium (VOLTAREN) 1 % GEL Apply 2 g topically 4 (four) times daily as needed. Patient not taking: Reported on 02/14/2020 01/26/18   Bufford Lope, DO  haloperidol (HALDOL) 0.5 MG tablet Take 1 tablet (0.5 mg total) by mouth every 6 (six) hours as needed for agitation (or nausea). 01/15/20   Micheline Rough, MD  hydrochlorothiazide (HYDRODIURIL) 25 MG tablet Take 1 tablet (25 mg total) by mouth daily. 03/02/19   Lattie Haw, MD  Hyprom-Naphaz-Polysorb-Zn Sulf (CLEAR EYES COMPLETE OP) Place 1 drop into both eyes daily as needed (dry eyes).     [provider]  lidocaine-prilocaine (EMLA) cream Apply 1 application topically every 14 (fourteen) days. Every 14 days as needed Patient taking differently: Apply 1 application topically as needed (port access).  06/17/19   Truitt Merle, MD  lisinopril (ZESTRIL) 40 MG tablet Take 1 tablet (40 mg total) by mouth daily. 03/02/19   Lattie Haw, MD  loratadine (CLARITIN) 10 MG tablet Take 1 tablet (10 mg total) by mouth daily as needed for allergies. 09/23/12   Waldemar Dickens, MD  LORazepam (ATIVAN) 0.5 MG tablet Take 1 tablet (0.5 mg total) by mouth every 4 (four) hours as needed for  anxiety (or nausea that is not controlled by other medications). 01/15/20   Micheline Rough, MD  LORazepam (ATIVAN) 1 MG tablet Take 1 mg by mouth daily as needed. 01/26/20   [provider]  metFORMIN (GLUCOPHAGE) 500 MG tablet TAKE 2 TABLETS TWICE DAILY WITH MEALS Patient taking differently: Take 1,000 mg by mouth 2 (two) times daily with a meal. TAKE 2 TABLETS TWICE DAILY WITH MEALS 03/02/19   Lattie Haw, MD  metoCLOPramide (REGLAN) 10 MG tablet Take 10 mg by mouth 4 (four) times daily. 01/25/20   [provider]  metoCLOPramide (REGLAN) 5 MG tablet Take 1 tablet (5 mg total) by mouth 4 (four) times daily -  before meals  and at bedtime. 01/15/20   Micheline Rough, MD  mirtazapine (REMERON SOL-TAB) 15 MG disintegrating tablet Take 1 tablet (15 mg total) by mouth at bedtime. 01/06/20 01/05/21  Truitt Merle, MD  Multiple Vitamin (MULTIVITAMIN WITH MINERALS) TABS tablet Take 1 tablet by mouth daily.    [provider]  Na Sulfate-K Sulfate-Mg Sulf (SUPREP BOWEL PREP KIT) 17.5-3.13-1.6 GM/177ML SOLN Take 1 kit by mouth as directed. Patient not taking: Reported on 01/15/2020 07/21/19   [name], MD  ondansetron (ZOFRAN ODT) 8 MG disintegrating tablet Take 1 tablet (8 mg total) by mouth every 8 (eight) hours as needed for nausea or vomiting. 01/12/20   Tanner, Lyndon Code., PA-C  ondansetron (ZOFRAN) 4 MG tablet Take 4 mg by mouth every 8 (eight) hours as needed for nausea or vomiting.    [provider]  oxyCODONE (OXY IR/ROXICODONE) 5 MG immediate release tablet Take 0.5-1 tablets (2.5-5 mg total) by mouth every 8 (eight) hours as needed for severe pain. 01/06/20   Truitt Merle, MD  Oxycodone HCl 10 MG TABS Take 10 mg by mouth 4 (four) times daily as needed. 01/26/20   [provider]  potassium chloride (KLOR-CON) 10 MEQ tablet Take 1 tablet (10 mEq total) by mouth daily. 01/10/20   Truitt Merle, MD  Probiotic Product (PROBIOTIC PO) Take 1 capsule by mouth daily.     [provider]  promethazine (PHENERGAN) 25 MG tablet Take 25 mg by mouth every 6 (six) hours as needed. 02/03/20   [provider]  RIVAROXABAN Alveda Reasons) VTE STARTER PACK (15 & 20 MG TABLETS) Follow package directions: Take one 64m tablet by mouth twice a day. On day 22, switch to one 232mtablet once a day. Take with food. 08/26/19   FeTruitt MerleMD  scopolamine (TRANSDERM-SCOP) 1 MG/3DAYS 1 patch every 3 (three) days. 02/06/20   [provider]  traMADol (ULTRAM) 50 MG tablet Take 1 tablet (50 mg total) by mouth every 6 (six) hours as needed. Patient not taking: Reported on 01/15/2020 12/20/19   FeTruitt MerleMD  VITAMIN E PO Take 1 tablet by mouth See admin instructions. 2-3 times a month    [provider]     Allergies    Other and Propofol   Review of Systems   Review of Systems A comprehensive review of systems was completed and negative except as noted in HPI.    Physical Exam BP 138/63   Pulse 68   Temp 98.6 F (37 C) (Oral)   Resp 14   SpO2 100%   Physical Exam Vitals and nursing note reviewed.  Constitutional:      Appearance: Normal appearance.     Comments: thin  HENT:     Head: Normocephalic and atraumatic.     Nose: Nose normal.     Mouth/Throat:     Mouth: Mucous membranes are moist.  Eyes:     Extraocular Movements: Extraocular movements intact.     Conjunctiva/sclera: Conjunctivae normal.  Cardiovascular:     Rate and Rhythm: Normal rate.  Pulmonary:     Effort: Pulmonary effort is normal.     Breath sounds: Normal breath sounds.     Comments: Port in R upper chest Abdominal:     General: Abdomen is flat.     Palpations: Abdomen is soft.     Tenderness: There is abdominal tenderness (moderate tenderness in RUQ). There is no guarding.  Musculoskeletal:        General: No swelling.  Normal range of motion.     Cervical back: Neck supple.  Skin:    General: Skin is warm and dry.  Neurological:     General: No focal  deficit present.     Mental Status: She is alert.  Psychiatric:        Mood and Affect: Mood normal.      ED Results / Procedures / Treatments   Labs (all labs ordered are listed, but only abnormal results are displayed) Labs Reviewed  COMPREHENSIVE METABOLIC PANEL - Abnormal; Notable for the following components:      Result Value   Potassium 3.0 (*)    CO2 33 (*)    Glucose, Bld 103 (*)    Calcium 8.8 (*)    Albumin 3.1 (*)    All other components within normal limits  CBC - Abnormal; Notable for the following components:   Platelets 136 (*)    All other components within normal limits  RESPIRATORY PANEL BY RT PCR (FLU A&B, COVID)  LIPASE, BLOOD  URINALYSIS, ROUTINE W REFLEX MICROSCOPIC    EKG None  Radiology No results found.  Procedures Procedures  Medications Ordered in the ED Medications  potassium chloride 10 mEq in 100 mL IVPB (10 mEq Intravenous New Bag/Given 02/14/20 1417)  sodium chloride 0.9 % bolus 1,000 mL (1,000 mLs Intravenous New Bag/Given 02/14/20 1311)  metoCLOPramide (REGLAN) injection 10 mg (10 mg Intravenous Given 02/14/20 1311)  morphine 4 MG/ML injection 4 mg (4 mg Intravenous Given 02/14/20 1311)     MDM Rules/Calculators/A&P MDM I had a long discussion with the patient and the daughter regarding the patient's symptoms, previous imaging results and that there is not a curative treatment for her. I stressed that there is no surgery or procedure which will be both safe and life-prolonging. I explained that we can give her fluids and medications to help her be comfortable but that is the extent of the treatments that we have available to help. I also discussed with Hospice who confirm this conversation has had with the patient and family in home several times in recent weeks.   ED Course  I have reviewed the triage vital signs and the nursing notes.  Pertinent labs & imaging results that were available during my care of the patient were reviewed  by me and considered in my medical decision making (see chart for details).  Clinical Course as of Feb 13 1438  Mon Feb 14, 2020  1304 Dr. Burr Medico, patient's oncologist has also come by to see the patient, reiterated there is no surgical option for this patient but recommends a short observation admission for hydration and symptom control. Will discuss with hospitalist after labs resulted.    [CS]  2426 CBC is normal.    [CS]  8341 CMP shows mild hypokalemia, will give IV repletion. Otherwise unremarkable.    [CS]  9622 Spoke with Dr. Marylyn Ishihara, Hospitalist, who will evaluate for admission.    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

## 2020-02-14 NOTE — ED Notes (Signed)
Pt provided chicken broth and crackers per her request

## 2020-02-14 NOTE — Progress Notes (Signed)
Patient arrives to room 1534 at this time via stretcher from ED

## 2020-02-14 NOTE — Progress Notes (Signed)
[name] [address] [facility]) Hospitalized Hospice Patient   Bonnie Peterson is a current hospice patient with a terminal diagnosis of intrahepatic cholangiocarcinoma per Dr. Cherie Ouch, [facility] MD.  Patient reports more than a week of intractable nausea and vomiting (N/V) despite multiple [facility] case manager visits and adjustments to medications.  EMS was activated by family after making hospice triage RN aware. She under observation for intractable nausea and vomiting.   Visited patient at the bedside; daughter Bonnie Peterson present at bedside. Pt vomiting on this RN's arrival and reported having tried a little chicken broth without success.  During visit pt rested, participated a little In discussion, some confusion evident.  Respirations even/unlabored; NAD noted when not retching.  This RN reviewed pt and Jackie's understanding of current situation.  Education offered about small bowel obstruction vs. narrowing due to radiation.  Bonnie Peterson verbalized understanding that no procedure would be curative but stated repeatedly, "I have to keep trying."  This RN discussed that sometimes the "fight" for a cure is transformed into a "fight" for comfort and an end to symptoms; Bonnie Peterson verbalized understanding.  Bonnie Peterson shares that a family member who is deceased has "visited" her mother, shares a sense of comfort in this.  Bonnie Peterson shares belief that timing and outcomes are in God's hands.  This RN provided active listening and validated caregiving.   V/S:  98.6 oral, 191/68, HR 64, RR 19, 100% RA I&O:  1000/- recorded Labs:  K 3.0, Albumin 3.1, Plt 136 IVs/PRNs: 1L NS bolus, now NS continuous infusion @75mL /hr; Potassium chloride 60mq x 4 doses PIV; 459mmorphine PIV x 1 dose; reglan 1055mIV x 1 dose, now 5mg48mglan q8h PRN n/v   Problem List: 1. Intractable N/V a. IV Fluids for rehydration b. IV antiemetics 2. Hypokalemia a. IV K replacement 3. Hypertension a. PRN meds  GOC: DNR, although family is not  reconciled to lack of treatment options D/C planning: home once optimized Family: updated at bedside IDT: Updated hospice team  Thank you for the opportunity to participate in this patient's care.  ChriDomenic MorasN, RN [facility] [phone] on call)

## 2020-02-14 NOTE — ED Notes (Signed)
Pt provided [place] ice per request

## 2020-02-14 NOTE — Progress Notes (Signed)
Bonnie Peterson   DOB:16-May-1945   WO#:[id number]   NOI#:[id number]  Oncology follow up   Subjective: Patient was brought to the emergency room today by her daughter due to persistent nausea vomiting for the past few weeks.  She is on liquid and soft keep much down due to nausea and vomiting.  She does have bowel movement.  No fever, chills.   Objective:  Vitals:   02/14/20 1600 02/14/20 1630  BP: (!) 191/68 (!) 152/79  Pulse: 64 63  Resp: 19 16  Temp:    SpO2: 100% 99%    There is no height or weight on file to calculate BMI.  Intake/Output Summary (Last 24 hours) at 02/14/2020 1728 Last data filed at 02/14/2020 1514 Gross per 24 hour  Intake 1000 ml  Output --  Net 1000 ml     Sclerae unicteric  Oropharynx clear  No peripheral adenopathy  Lungs clear -- no rales or rhonchi  Heart regular rate and rhythm  Abdomen soft, not distended   MSK no focal spinal tenderness, no peripheral edema  Neuro nonfocal   CBG (last 3)  No results for input(s): GLUCAP in the last 72 hours.   Labs:  Urine Studies No results for input(s): UHGB, CRYS in the last 72 hours.  Invalid input(s): UACOL, UAPR, USPG, UPH, UTP, UGL, UKET, UBIL, UNIT, UROB, ULEU, UEPI, UWBC, URBC, UBAC, CAST, UCOM, BILUA  Basic Metabolic Panel: Recent Labs  Lab 02/14/20 1112  NA 145  K 3.0*  CL 98  CO2 33*  GLUCOSE 103*  BUN 20  CREATININE 0.73  CALCIUM 8.8*   GFR CrCl cannot be calculated (Unknown ideal weight.). Liver Function Tests: Recent Labs  Lab 02/14/20 1112  AST 34  ALT 17  ALKPHOS 78  BILITOT 1.2  PROT 6.9  ALBUMIN 3.1*   Recent Labs  Lab 02/14/20 1112  LIPASE 20   No results for input(s): AMMONIA in the last 168 hours. Coagulation profile No results for input(s): INR, PROTIME in the last 168 hours.  CBC: Recent Labs  Lab 02/14/20 1112  WBC 5.7  HGB 12.2  HCT 39.5  MCV 94.3  PLT 136*   Cardiac Enzymes: No results for input(s): CKTOTAL, CKMB, CKMBINDEX, TROPONINI in the  last 168 hours. BNP: Invalid input(s): POCBNP CBG: No results for input(s): GLUCAP in the last 168 hours. D-Dimer No results for input(s): DDIMER in the last 72 hours. Hgb A1c No results for input(s): HGBA1C in the last 72 hours. Lipid Profile No results for input(s): CHOL, HDL, LDLCALC, TRIG, CHOLHDL, LDLDIRECT in the last 72 hours. Thyroid function studies No results for input(s): TSH, T4TOTAL, T3FREE, THYROIDAB in the last 72 hours.  Invalid input(s): FREET3 Anemia work up No results for input(s): VITAMINB12, FOLATE, FERRITIN, TIBC, IRON, RETICCTPCT in the last 72 hours. Microbiology No results found for this or any previous visit (from the past 240 hour(s)).    Studies:  No results found.  Assessment: 74 y.o. with metastatic cholangiocarcinoma, currently under hospice care, persistent nausea and vomiting.  1.  Refractory nausea and vomiting, probably secondary to gastric outlet obstruction from her metastatic cancer 2. Metastatic cholangiocarcinoma 3. Severe protein and calori malnutrition  4. HTN  5. Hypokalemia   Plan:  -I had a long discussion with pt and her daughter Bonnie Peterson in ED about the etiology of symptoms and incurable nature of her cancer. She is not a candidate for chemo (she previously stopped chemo per her request) and has been under hospice care  lately. Unfortunately pt and her daughter are still in denial. -I recommend clear liquid by mouth only. Please get a KUB also -after a lengthy discussion, pt wants to be admitted for supportive care, and think about what to do next. I recommend her to consider residential hospice,this could be evaluate by her hospice team while she is here  -if she continue to refuse hospice and still wants to eat food, we can consider venting G-tube placement.  -I will f/u on Wednesday    Truitt Merle, MD 02/14/2020  5:28 PM

## 2020-02-14 NOTE — ED Triage Notes (Signed)
Patient reports she has cancer and has not been able to keep food down and she was told she has a "bowel blockage" but there was nothing they could do because of her cancer. Patient is followed by hospice

## 2020-02-15 ENCOUNTER — Observation Stay (HOSPITAL_COMMUNITY)

## 2020-02-15 ENCOUNTER — Ambulatory Visit: Payer: Medicare HMO | Admitting: Family Medicine

## 2020-02-15 DIAGNOSIS — K2100 Gastro-esophageal reflux disease with esophagitis, without bleeding: Secondary | ICD-10-CM | POA: Diagnosis present

## 2020-02-15 DIAGNOSIS — Z66 Do not resuscitate: Secondary | ICD-10-CM | POA: Diagnosis present

## 2020-02-15 DIAGNOSIS — C787 Secondary malignant neoplasm of liver and intrahepatic bile duct: Secondary | ICD-10-CM | POA: Diagnosis present

## 2020-02-15 DIAGNOSIS — Z87891 Personal history of nicotine dependence: Secondary | ICD-10-CM | POA: Diagnosis not present

## 2020-02-15 DIAGNOSIS — Z515 Encounter for palliative care: Secondary | ICD-10-CM | POA: Diagnosis present

## 2020-02-15 DIAGNOSIS — E876 Hypokalemia: Secondary | ICD-10-CM | POA: Diagnosis present

## 2020-02-15 DIAGNOSIS — Z7901 Long term (current) use of anticoagulants: Secondary | ICD-10-CM | POA: Diagnosis not present

## 2020-02-15 DIAGNOSIS — C221 Intrahepatic bile duct carcinoma: Secondary | ICD-10-CM | POA: Diagnosis present

## 2020-02-15 DIAGNOSIS — Z7983 Long term (current) use of bisphosphonates: Secondary | ICD-10-CM | POA: Diagnosis not present

## 2020-02-15 DIAGNOSIS — K56609 Unspecified intestinal obstruction, unspecified as to partial versus complete obstruction: Secondary | ICD-10-CM | POA: Diagnosis not present

## 2020-02-15 DIAGNOSIS — Z20822 Contact with and (suspected) exposure to covid-19: Secondary | ICD-10-CM | POA: Diagnosis present

## 2020-02-15 DIAGNOSIS — K3189 Other diseases of stomach and duodenum: Secondary | ICD-10-CM | POA: Diagnosis not present

## 2020-02-15 DIAGNOSIS — R531 Weakness: Secondary | ICD-10-CM | POA: Diagnosis not present

## 2020-02-15 DIAGNOSIS — R52 Pain, unspecified: Secondary | ICD-10-CM | POA: Diagnosis not present

## 2020-02-15 DIAGNOSIS — I959 Hypotension, unspecified: Secondary | ICD-10-CM | POA: Diagnosis not present

## 2020-02-15 DIAGNOSIS — E43 Unspecified severe protein-calorie malnutrition: Secondary | ICD-10-CM | POA: Diagnosis present

## 2020-02-15 DIAGNOSIS — Z923 Personal history of irradiation: Secondary | ICD-10-CM | POA: Diagnosis not present

## 2020-02-15 DIAGNOSIS — D6959 Other secondary thrombocytopenia: Secondary | ICD-10-CM | POA: Diagnosis present

## 2020-02-15 DIAGNOSIS — E119 Type 2 diabetes mellitus without complications: Secondary | ICD-10-CM | POA: Diagnosis present

## 2020-02-15 DIAGNOSIS — R112 Nausea with vomiting, unspecified: Secondary | ICD-10-CM | POA: Diagnosis not present

## 2020-02-15 DIAGNOSIS — Z6823 Body mass index (BMI) 23.0-23.9, adult: Secondary | ICD-10-CM | POA: Diagnosis not present

## 2020-02-15 DIAGNOSIS — Z79899 Other long term (current) drug therapy: Secondary | ICD-10-CM | POA: Diagnosis not present

## 2020-02-15 DIAGNOSIS — J449 Chronic obstructive pulmonary disease, unspecified: Secondary | ICD-10-CM | POA: Diagnosis present

## 2020-02-15 DIAGNOSIS — E785 Hyperlipidemia, unspecified: Secondary | ICD-10-CM | POA: Diagnosis present

## 2020-02-15 DIAGNOSIS — Z7189 Other specified counseling: Secondary | ICD-10-CM | POA: Diagnosis not present

## 2020-02-15 DIAGNOSIS — K311 Adult hypertrophic pyloric stenosis: Secondary | ICD-10-CM | POA: Diagnosis present

## 2020-02-15 DIAGNOSIS — I10 Essential (primary) hypertension: Secondary | ICD-10-CM | POA: Diagnosis present

## 2020-02-15 DIAGNOSIS — Z7984 Long term (current) use of oral hypoglycemic drugs: Secondary | ICD-10-CM | POA: Diagnosis not present

## 2020-02-15 DIAGNOSIS — K2970 Gastritis, unspecified, without bleeding: Secondary | ICD-10-CM | POA: Diagnosis present

## 2020-02-15 DIAGNOSIS — Z800 Family history of malignant neoplasm of digestive organs: Secondary | ICD-10-CM | POA: Diagnosis not present

## 2020-02-15 DIAGNOSIS — M5136 Other intervertebral disc degeneration, lumbar region: Secondary | ICD-10-CM | POA: Diagnosis not present

## 2020-02-15 LAB — URINALYSIS, ROUTINE W REFLEX MICROSCOPIC
Bilirubin Urine: NEGATIVE
Glucose, UA: NEGATIVE mg/dL
Hgb urine dipstick: NEGATIVE
Ketones, ur: 5 mg/dL — AB
Leukocytes,Ua: NEGATIVE
Nitrite: NEGATIVE
Protein, ur: 100 mg/dL — AB
Specific Gravity, Urine: 1.027 (ref 1.005–1.030)
pH: 5 (ref 5.0–8.0)

## 2020-02-15 LAB — CBC
HCT: 38.1 % (ref 36.0–46.0)
Hemoglobin: 11.6 g/dL — ABNORMAL LOW (ref 12.0–15.0)
MCH: 28.9 pg (ref 26.0–34.0)
MCHC: 30.4 g/dL (ref 30.0–36.0)
MCV: 94.8 fL (ref 80.0–100.0)
Platelets: 110 10*3/uL — ABNORMAL LOW (ref 150–400)
RBC: 4.02 MIL/uL (ref 3.87–5.11)
RDW: 14.9 % (ref 11.5–15.5)
WBC: 4.7 10*3/uL (ref 4.0–10.5)
nRBC: 0 % (ref 0.0–0.2)

## 2020-02-15 LAB — COMPREHENSIVE METABOLIC PANEL
ALT: 17 U/L (ref 0–44)
AST: 33 U/L (ref 15–41)
Albumin: 2.8 g/dL — ABNORMAL LOW (ref 3.5–5.0)
Alkaline Phosphatase: 76 U/L (ref 38–126)
Anion gap: 10 (ref 5–15)
BUN: 16 mg/dL (ref 8–23)
CO2: 30 mmol/L (ref 22–32)
Calcium: 8.3 mg/dL — ABNORMAL LOW (ref 8.9–10.3)
Chloride: 99 mmol/L (ref 98–111)
Creatinine, Ser: 0.68 mg/dL (ref 0.44–1.00)
GFR, Estimated: >60 mL/min (ref >60)
Glucose, Bld: 121 mg/dL — ABNORMAL HIGH (ref 70–99)
Potassium: 3 mmol/L — ABNORMAL LOW (ref 3.5–5.1)
Sodium: 139 mmol/L (ref 135–145)
Total Bilirubin: 0.7 mg/dL (ref 0.3–1.2)
Total Protein: 6.1 g/dL — ABNORMAL LOW (ref 6.5–8.1)

## 2020-02-15 MED ORDER — POTASSIUM CHLORIDE 20 MEQ PO PACK
40.0000 meq | PACK | Freq: Two times a day (BID) | ORAL | Status: DC
  Administered 2020-02-15: 40 meq via ORAL
  Filled 2020-02-15: qty 2

## 2020-02-15 MED ORDER — PROCHLORPERAZINE EDISYLATE 10 MG/2ML IJ SOLN
10.0000 mg | Freq: Four times a day (QID) | INTRAMUSCULAR | Status: DC | PRN
  Administered 2020-02-16: 10 mg via INTRAVENOUS
  Filled 2020-02-15: qty 2

## 2020-02-15 MED ORDER — CHLORHEXIDINE GLUCONATE CLOTH 2 % EX PADS
6.0000 | MEDICATED_PAD | Freq: Every day | CUTANEOUS | Status: DC
  Administered 2020-02-15 – 2020-02-23 (×9): 6 via TOPICAL

## 2020-02-15 MED ORDER — HALOPERIDOL 0.5 MG PO TABS
0.5000 mg | ORAL_TABLET | Freq: Four times a day (QID) | ORAL | Status: DC | PRN
  Filled 2020-02-15: qty 1

## 2020-02-15 MED ORDER — POTASSIUM CHLORIDE 10 MEQ/100ML IV SOLN
10.0000 meq | INTRAVENOUS | Status: AC
  Administered 2020-02-15 (×3): 10 meq via INTRAVENOUS
  Filled 2020-02-15 (×3): qty 100

## 2020-02-15 MED ORDER — MIRTAZAPINE 15 MG PO TBDP
15.0000 mg | ORAL_TABLET | Freq: Every day | ORAL | Status: DC
  Administered 2020-02-15 – 2020-02-17 (×2): 15 mg via ORAL
  Filled 2020-02-15 (×3): qty 1

## 2020-02-15 MED ORDER — METOCLOPRAMIDE HCL 5 MG/ML IJ SOLN
5.0000 mg | Freq: Four times a day (QID) | INTRAMUSCULAR | Status: DC
  Administered 2020-02-15 – 2020-02-22 (×27): 5 mg via INTRAVENOUS
  Filled 2020-02-15 (×27): qty 2

## 2020-02-15 MED ORDER — CLEAR EYES COMPLETE OP SOLN: Freq: Every day | OPHTHALMIC | Status: DC | PRN

## 2020-02-15 MED ORDER — KCL IN DEXTROSE-NACL 40-5-0.9 MEQ/L-%-% IV SOLN
INTRAVENOUS | Status: DC
  Filled 2020-02-15 (×9): qty 1000

## 2020-02-15 MED ORDER — MORPHINE SULFATE (PF) 2 MG/ML IV SOLN
2.0000 mg | INTRAVENOUS | Status: DC | PRN
  Administered 2020-02-15: 4 mg via INTRAVENOUS
  Filled 2020-02-15: qty 2

## 2020-02-15 MED ORDER — LORAZEPAM 0.5 MG PO TABS: 0.5000 mg | ORAL_TABLET | ORAL | Status: DC | PRN

## 2020-02-15 NOTE — Progress Notes (Signed)
Bonnie Peterson [address] [facility]) Hospitalized Hospice Patient   Bonnie Peterson is a current hospice patient with a terminal diagnosis of intrahepatic cholangiocarcinoma per Dr. Cherie Ouch, [facility] MD.  Patient reports more than a week of intractable nausea and vomiting (N/V) despite multiple [facility] case manager visits and adjustments to medications.  EMS was activated by family after making hospice triage RN aware. She under observation for intractable nausea and vomiting.  Per Dr. Karie Georges this is a related admission. Visited patient at the bedside; no family present at bedside. Patient resting, respirations even and unlabored; NAD noted.  Pt opened eyes to voice, remembered this RN from previous visits.  Pt shared that she has a "better" understanding of her situation and states, "I want to go out quietly."  This RN offered active listening.  Untouched clear diet lunch tray at bedside; 4-6 bites of jello from earlier had been eaten.  Pt denies hunger, reports food makes her stomach feel "too acidic."  Pt denies pain/nausea at this time but endorses anxiety.  Med order reviewed; no anxiolytic ordered.  MD made aware.  Reports exchanged with bedside RN who reports N/V well-managed with IV reglan.  Voicemail left for daughter [name].  ACC chart shows Bonnie Forest SW had phone visit with daughter [name] today to address goals of care and plans for care at end of life.   V/S:  98.3 oral, 139/69, HR 79, RR 14, 96%RA I&O: not charted Labs:  K 3.0, Ca 8.3, Albumin 2.8, total prot 6.1, Hgb 11.6,  Plt 110 IVs/PRNs: D5-0.9% NS with 40 mEq K@ 94m/hr continuous infusion;  Potassium chloride 147m x 3 doses PIV; 102m38morphine PIV x 1 dose; reglan 5 mg PIV x 2 dose; Ativan 0.5 mg q4h PRN anxiety ordered (no dodes yet given); Compazine 76m83mV PRN N/V (0 doses given)   Problem List: 1. Intractable N/V a. IV Fluids for rehydration b. IV antiemetics 2. Hypokalemia a. IV K replacement 3. Hypertension a. PRN meds  GOC:  DNR, although family is not reconciled to lack of treatment options D/C planning: home once optimized Family: message left for RobiShirlean MylarC [facility] family IDT: Updated hospice team  Chrislyn KingEdison PaceN, RN [facility] [phone] on call)

## 2020-02-15 NOTE — Progress Notes (Addendum)
Bonnie Peterson  GYK:[id number] DOB: 1946/02/14 DOA: 02/14/2020 PCP: Lattie Haw, MD    Brief Narrative:  74yo with a history of intrahepatic cholangiocarcinoma with metastatic disease who presented to the ED with intractable nausea and vomiting.  Family reported a 3-week history of nausea with intermittent vomiting that have been reasonably controlled using Reglan, scopolamine, and Phenergan at home.  Symptoms worsened and she was unable to keep down food or liquids.  The patient has had multiple recent ED visits for these same symptoms.  She has home hospice assistance but is not comfort care at present.  The patient was seen by her oncologist in the ED who confirmed that there is no active treatment that is appropriate for her cancer.  Significant Events:  11/8 admit via ED  Antimicrobials:  None  DVT prophylaxis: SCDs  Subjective: The patient was complaining of significant abdominal pain earlier but this improved with use of IV morphine.  Her intake remains quite limited but she feels that her nausea may be improving.  She denies chest pain or shortness of breath.  Assessment & Plan:  Intractable nausea and vomiting Obtain KUB to more clearly defined exact etiology -small bowel obstruction/stricture suspected -limit to clear liquids until symptoms improved -oncology suggest considering a percutaneous gastrostomy tube for venting purposes if symptoms persist  Widely metastatic cholangiocarcinoma Not a candidate for treatment per Oncology  Hypokalemia Due to poor intake -supplement and follow -magnesium is normal  HTN No indication for strict blood pressure control -blood pressure stable  DM2 Strict control of CBG not indicated - follow to assure pt not experiencing hypoglycemia   Thrombocytopenia No evidence of spontaneous bleeding at present -avoiding pharmacologic DVT prophylaxis  Code Status: NO CODE BLUE Family Communication: No family present at time of exam Status  is: Inpatient  Remains inpatient appropriate because:Inpatient level of care appropriate due to severity of illness   Dispo: The patient is from: Home              Anticipated d/c is to: Home              Anticipated d/c date is: 2 days              Patient currently is not medically stable to d/c.  Consultants:  Oncology  Objective: Blood pressure (!) 154/77, pulse 70, temperature 98.1 F (36.7 C), resp. rate 16, SpO2 95 %.  Intake/Output Summary (Last 24 hours) at 02/15/2020 1108 Last data filed at 02/15/2020 7939 Gross per 24 hour  Intake 1724.43 ml  Output 200 ml  Net 1524.43 ml   There were no vitals filed for this visit.  Examination: General: No acute respiratory distress  Lungs: Clear B Cardiovascular: Regular rate and rhythm  Abdomen: Nondistended, soft, no rebound Extremities: No significant edema bilateral lower extremities  CBC: Recent Labs  Lab 02/14/20 1112 02/15/20 0417  WBC 5.7 4.7  HGB 12.2 11.6*  HCT 39.5 38.1  MCV 94.3 94.8  PLT 136* 030*   Basic Metabolic Panel: Recent Labs  Lab 02/14/20 1112 02/14/20 1307 02/15/20 0417  NA 145  --  139  K 3.0*  --  3.0*  CL 98  --  99  CO2 33*  --  30  GLUCOSE 103*  --  121*  BUN 20  --  16  CREATININE 0.73  --  0.68  CALCIUM 8.8*  --  8.3*  MG  --  2.2  --    GFR: CrCl cannot be  calculated (Unknown ideal weight.).  Liver Function Tests: Recent Labs  Lab 02/14/20 1112 02/15/20 0417  AST 34 33  ALT 17 17  ALKPHOS 78 76  BILITOT 1.2 0.7  PROT 6.9 6.1*  ALBUMIN 3.1* 2.8*   Recent Labs  Lab 02/14/20 1112  LIPASE 20    HbA1C: HbA1c, POC (controlled diabetic range)  Date/Time Value Ref Range Status  06/17/2019 02:00 PM 7.2 (A) 0.0 - 7.0 % Final  11/05/2018 03:22 PM 7.8 (A) 0.0 - 7.0 % Final     Recent Results (from the past 240 hour(s))  Respiratory Panel by RT PCR (Flu A&B, Covid) - Nasopharyngeal Swab     Status: None   Collection Time: 02/14/20  3:45 PM   Specimen:  Nasopharyngeal Swab  Result Value Ref Range Status   SARS Coronavirus 2 by RT PCR NEGATIVE NEGATIVE Final    Comment: (NOTE) SARS-CoV-2 target nucleic acids are NOT DETECTED.  The SARS-CoV-2 RNA is generally detectable in upper respiratoy specimens during the acute phase of infection. The lowest concentration of SARS-CoV-2 viral copies this assay can detect is 131 copies/mL. A negative result does not preclude SARS-Cov-2 infection and should not be used as the sole basis for treatment or other patient management decisions. A negative result may occur with  improper specimen collection/handling, submission of specimen other than nasopharyngeal swab, presence of viral mutation(s) within the areas targeted by this assay, and inadequate number of viral copies (<131 copies/mL). A negative result must be combined with clinical observations, patient history, and epidemiological information. The expected result is Negative.  Fact Sheet for Patients:  PinkCheek.be  Fact Sheet for Healthcare Providers:  GravelBags.it  This test is no t yet approved or cleared by the [place] FDA and  has been authorized for detection and/or diagnosis of SARS-CoV-2 by FDA under an Emergency Use Authorization (EUA). This EUA will remain  in effect (meaning this test can be used) for the duration of the COVID-19 declaration under Section 564(b)(1) of the Act, 21 U.S.C. section 360bbb-3(b)(1), unless the authorization is terminated or revoked sooner.     Influenza A by PCR NEGATIVE NEGATIVE Final   Influenza B by PCR NEGATIVE NEGATIVE Final    Comment: (NOTE) The Xpert Xpress SARS-CoV-2/FLU/RSV assay is intended as an aid in  the diagnosis of influenza from Nasopharyngeal swab specimens and  should not be used as a sole basis for treatment. Nasal washings and  aspirates are unacceptable for Xpert Xpress SARS-CoV-2/FLU/RSV  testing.  Fact Sheet  for Patients: PinkCheek.be  Fact Sheet for Healthcare Providers: GravelBags.it  This test is not yet approved or cleared by the [place] FDA and  has been authorized for detection and/or diagnosis of SARS-CoV-2 by  FDA under an Emergency Use Authorization (EUA). This EUA will remain  in effect (meaning this test can be used) for the duration of the  Covid-19 declaration under Section 564(b)(1) of the Act, 21  U.S.C. section 360bbb-3(b)(1), unless the authorization is  terminated or revoked. Performed at [facility], [place] [address], [place], [place] [address]      Scheduled Meds: . Chlorhexidine Gluconate Cloth  6 each Topical Daily  . potassium chloride  40 mEq Oral BID   Continuous Infusions: . sodium chloride 75 mL/hr at 02/14/20 1941     LOS: 0 days   Cherene Altes, MD [facility] Office  [phone] Pager - Text Page per Shea Evans  If 7PM-7AM, please contact night-coverage per Amion 02/15/2020, 11:08 AM

## 2020-02-15 NOTE — Progress Notes (Signed)
Palliative Medicine RN Note: Palliative consult order noted from [facility], as family is not accepting that patient is at end of life.  Ms Linnen is admitted as a GIP patient for [facility] who has seen her last night. Due to high consult volumes, there will be a delay in PMT availability to see her. I have reached out to their liaison Chrislyn to let them know that the family needs extra support.   Marjie Skiff Raven Furnas, RN, BSN, [facility] Palliative Medicine Team 02/15/2020 3:05 PM Office [phone]

## 2020-02-16 ENCOUNTER — Other Ambulatory Visit: Payer: Self-pay

## 2020-02-16 DIAGNOSIS — Z515 Encounter for palliative care: Secondary | ICD-10-CM

## 2020-02-16 DIAGNOSIS — R531 Weakness: Secondary | ICD-10-CM

## 2020-02-16 DIAGNOSIS — Z7189 Other specified counseling: Secondary | ICD-10-CM | POA: Diagnosis not present

## 2020-02-16 DIAGNOSIS — K56609 Unspecified intestinal obstruction, unspecified as to partial versus complete obstruction: Secondary | ICD-10-CM | POA: Diagnosis not present

## 2020-02-16 LAB — GLUCOSE, CAPILLARY
Glucose-Capillary: 148 mg/dL — ABNORMAL HIGH (ref 70–99)
Glucose-Capillary: 122 mg/dL — ABNORMAL HIGH (ref 70–99)
Glucose-Capillary: 114 mg/dL — ABNORMAL HIGH (ref 70–99)

## 2020-02-16 LAB — BASIC METABOLIC PANEL
Anion gap: 8 (ref 5–15)
BUN: 10 mg/dL (ref 8–23)
CO2: 28 mmol/L (ref 22–32)
Calcium: 8.2 mg/dL — ABNORMAL LOW (ref 8.9–10.3)
Chloride: 101 mmol/L (ref 98–111)
Creatinine, Ser: 0.54 mg/dL (ref 0.44–1.00)
GFR, Estimated: >60 mL/min (ref >60)
Glucose, Bld: 120 mg/dL — ABNORMAL HIGH (ref 70–99)
Potassium: 3.4 mmol/L — ABNORMAL LOW (ref 3.5–5.1)
Sodium: 137 mmol/L (ref 135–145)

## 2020-02-16 LAB — CBC
HCT: 38 % (ref 36.0–46.0)
Hemoglobin: 12.1 g/dL (ref 12.0–15.0)
MCH: 29.7 pg (ref 26.0–34.0)
MCHC: 31.8 g/dL (ref 30.0–36.0)
MCV: 93.1 fL (ref 80.0–100.0)
Platelets: 111 10*3/uL — ABNORMAL LOW (ref 150–400)
RBC: 4.08 MIL/uL (ref 3.87–5.11)
RDW: 14.6 % (ref 11.5–15.5)
WBC: 5.2 10*3/uL (ref 4.0–10.5)
nRBC: 0 % (ref 0.0–0.2)

## 2020-02-16 LAB — MAGNESIUM: Magnesium: 2 mg/dL (ref 1.7–2.4)

## 2020-02-16 MED ORDER — PROMETHAZINE HCL 25 MG/ML IJ SOLN
6.2500 mg | Freq: Four times a day (QID) | INTRAMUSCULAR | Status: DC | PRN
  Administered 2020-02-16: 6.25 mg via INTRAVENOUS
  Filled 2020-02-16: qty 1

## 2020-02-16 MED ORDER — HYDROMORPHONE HCL 1 MG/ML IJ SOLN
0.5000 mg | INTRAMUSCULAR | Status: DC | PRN
  Administered 2020-02-16 – 2020-02-22 (×18): 0.5 mg via INTRAVENOUS
  Filled 2020-02-16 (×18): qty 0.5

## 2020-02-16 MED ORDER — PROMETHAZINE HCL 25 MG/ML IJ SOLN
6.2500 mg | INTRAMUSCULAR | Status: DC | PRN
  Administered 2020-02-16 – 2020-02-20 (×13): 6.25 mg via INTRAVENOUS
  Filled 2020-02-16 (×13): qty 1

## 2020-02-16 NOTE — Consult Note (Signed)
Consultation Note Date: 02/16/2020   Patient Name: Bonnie Peterson  DOB: Sep 16, 1945  MRN: [id number]  Age / Sex: 74 y.o., female  PCP: Lattie Haw, MD Referring Physician: Caren Griffins, MD  Reason for Consultation: Establishing goals of care  HPI/Patient Profile: 74 y.o. female    admitted on 02/14/2020    Clinical Assessment and Goals of Care: Bonnie Peterson is currently enrolled in hospice, she has life limiting illness of metastatic cholangiocarcinoma and she was under the care of Dr. Burr Medico from medical oncology.  Patient has been admitted with intractable nausea vomiting and uncontrolled pain.  Hospice liaison's are also following.  Palliative consultation for goals of care discussions and additional support and symptom management has been requested.  Bonnie Peterson appears weak and chronically ill.  She is resting in bed.  She awakens and is able to answer questions appropriately.  She states she was hurting earlier today.  She felt good relief after IV Dilaudid.  She has recently been opioid rotated from morphine to Dilaudid with good results.  She remains on Reglan and Compazine for antiemetic regimen.  She states that she did have some vomiting last night as well as early this morning.  Patient son is in the room.  Patient's daughter [name] was also on the phone.  Palliative medicine is specialized medical care for people living with serious illness. It focuses on providing relief from the symptoms and stress of a serious illness. The goal is to improve quality of life for both the patient and the family.  Goals of care: Broad aims of medical therapy in relation to the patient's values and preferences. Our aim is to provide medical care aimed at enabling patients to achieve the goals that matter most to them, given the circumstances of their particular medical situation and their constraints.   Discussed  about symptom management.  Discussed about goals wishes and values important to the patient and family.  See below.  Thank you for the consult.  HCPOA 2 daughters Kennyth Lose and Bailey Mech  SUMMARY OF RECOMMENDATIONS   Limited code as per family request.  No CPR, no defibrillation but desire all other interventions. Continue current pain and nonpain symptom management.  Patient on Dilaudid IV as needed, antiemetic regimen also noted. Patient is being followed by hospice.  Overall patient and family's goals are not necessarily for transition to residential hospice at this time.  Continue efforts at symptom management, trust building and ongoing goals of care discussions and appropriate disposition options.  Could consider back to home with hospice once symptom management is better. Thank you for the consult. Code Status/Advance Care Planning:  Limited code    Symptom Management:    med history noted, continue current pain and non pain symptom management.    Palliative Prophylaxis:   Delirium Protocol   Psycho-social/Spiritual:   Desire for further Chaplaincy support:yes  Additional Recommendations: Caregiving  Support/Resources  Prognosis:   Guarded.   Discharge Planning: To Be Determined      Primary Diagnoses: Present  on Admission: . Intractable nausea and vomiting . Cholangiocarcinoma ([place])   I have reviewed the medical record, interviewed the patient and family, and examined the patient. The following aspects are pertinent.  Past Medical History:  Diagnosis Date  . Anxiety   . Benign positional vertigo   . Cancer ([place]) 02/09/15   intrahepatic cholangiocarcinoma  . COPD (chronic obstructive pulmonary disease) ([place])   . Depression   . Diabetes mellitus without complication ([place])   . Early cataracts, bilateral 10/13   Optho, Dr Gershon Crane  . Echocardiogram findings abnormal, without diagnosis 10/10   10/10: mild pulm HTN, EF 60-65%, mild LVH, moderate aortic regurg  .  GERD (gastroesophageal reflux disease)    I have "acid reflux"  . Gout   . HLD (hyperlipidemia)   . HTN, goal below 130/80   . Irritable bowel syndrome   . Obesity, Class III, BMI 40-49.9 (morbid obesity) ([place])   . Obstructive sleep apnea    wears cpap  . Osteoarthritis (arthritis due to wear and tear of joints)    also gout  . Restrictive lung disease    Social History   Socioeconomic History  . Marital status: Divorced    Spouse name: Not on file  . Number of children: 4  . Years of education: Not on file  . Highest education level: Not on file  Occupational History  . Occupation: unemployed  Tobacco Use  . Smoking status: Former Smoker    Packs/day: 0.50    Years: 49.00    Pack years: 24.50    Types: Cigarettes    Quit date: 04/13/2018    Years since quitting: 1.8  . Smokeless tobacco: Never Used  Vaping Use  . Vaping Use: Never used  Substance and Sexual Activity  . Alcohol use: Yes    Alcohol/week: 0.0 standard drinks    Comment: occasionally  . Drug use: Yes    Types: Marijuana  . Sexual activity: Never  Other Topics Concern  . 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 Difficulty of Paying Living Expenses: Not on file  Food Insecurity:   . Worried About Charity fundraiser in the Last Year: Not on file  . Ran Out of Food in the Last Year: Not on file  Transportation Needs:   . Lack of Transportation (Medical): Not on file  . Lack of Transportation (Non-Medical): Not on file  Physical Activity:   . Days of Exercise per Week: Not on file  . Minutes of Exercise per Session: Not on file  Stress:   .  Feeling of Stress : Not on file  Social Connections:   . Frequency of Communication with Friends and Family: Not on file  . Frequency of Social Gatherings with Friends and Family: Not on file  . Attends Religious Services: Not on file  . Active Member of Clubs or Organizations: Not on file  . Attends Archivist Meetings: Not on file  . Marital Status: Not on file   Family History  Problem Relation Age of Onset  . Breast cancer Mother   .  Hypertension Mother   . Coronary artery disease Mother   . Diabetes type II Mother   . Rheum arthritis Mother   . Diabetes type II Sister   . Pancreatic cancer Sister    Scheduled Meds: . Chlorhexidine Gluconate Cloth  6 each Topical Daily  . metoCLOPramide (REGLAN) injection  5 mg Intravenous Q6H  . mirtazapine  15 mg Oral QHS   Continuous Infusions: . dextrose 5 % and 0.9 % NaCl with KCl 40 mEq/L 50 mL/hr at 02/15/20 2032   PRN Meds:.haloperidol, HYDROmorphone (DILAUDID) injection, LORazepam, promethazine Medications Prior to Admission:  Prior to Admission medications   Medication Sig Start Date End Date Taking? Authorizing Provider  albuterol (PROAIR HFA) 108 (90 Base) MCG/ACT inhaler Inhale 1-2 puffs into the lungs every 6 (six) hours as needed for wheezing or shortness of breath. 07/23/19  Yes Parrett, Tammy S, NP  alendronate (FOSAMAX) 70 MG tablet TAKE 1 TABLET  ONCE A WEEK. Patient taking differently: Take 70 mg by mouth every Sunday. TAKE 1 TABLET  ONCE A WEEK. 03/02/19  Yes Lattie Haw, MD  amLODipine (NORVASC) 10 MG tablet TAKE 1 TABLET EVERY DAY Patient taking differently: Take 10 mg by mouth daily.  06/03/19  Yes Lattie Haw, MD  cetirizine (ZYRTEC) 10 MG tablet Take 10 mg by mouth daily as needed for allergies.   Yes [provider]  cholecalciferol (VITAMIN D) 1000 units tablet Take 1 tablet (1,000 Units total) by mouth daily. 01/03/17  Yes Bufford Lope, DO  dicyclomine (BENTYL) 20 MG tablet Take 1 tablet (20  mg total) by mouth 2 (two) times daily as needed for spasms (abdominal pain). 01/11/20  Yes Gareth Morgan, MD  famotidine (PEPCID) 20 MG tablet Take 1 tablet (20 mg total) by mouth 2 (two) times daily. Patient taking differently: Take 20 mg by mouth 2 (two) times daily as needed for heartburn.  06/26/19  Yes Lattie Haw, MD  fish oil-omega-3 fatty acids 1000 MG capsule Take 1 g by mouth once a week.    Yes [provider]  fluticasone (FLONASE) 50 MCG/ACT nasal spray Place 2 sprays into both nostrils daily as needed for rhinitis. 04/17/16  Yes Haney, Alyssa A, MD  haloperidol (HALDOL) 0.5 MG tablet Take 1 tablet (0.5 mg total) by mouth every 6 (six) hours as needed for agitation (or nausea). 01/15/20  Yes Micheline Rough, MD  Hyprom-Naphaz-Polysorb-Zn Sulf (CLEAR EYES COMPLETE OP) Place 1 drop into both eyes daily as needed (dry eyes).    Yes [provider]  lidocaine-prilocaine (EMLA) cream Apply 1 application topically every 14 (fourteen) days. Every 14 days as needed Patient taking differently: Apply 1 application topically as needed (port access).  06/17/19  Yes Truitt Merle, MD  lisinopril (ZESTRIL) 40 MG tablet Take 1 tablet (40 mg total) by mouth daily. 03/02/19  Yes Lattie Haw, MD  LORazepam (ATIVAN) 1 MG tablet Take 1 mg by mouth daily as needed for anxiety.  01/26/20  Yes [provider]  metoCLOPramide (REGLAN) 10 MG tablet Take 10 mg by mouth 4 (four) times daily. 01/25/20  Yes [provider]  mirtazapine (REMERON SOL-TAB) 15 MG disintegrating tablet Take 1 tablet (15 mg total) by mouth at bedtime. 01/06/20 01/05/21 Yes Truitt Merle, MD  Multiple Vitamin (MULTIVITAMIN WITH MINERALS) TABS tablet Take 1 tablet by mouth daily.   Yes [provider]  ondansetron (ZOFRAN ODT) 8 MG disintegrating tablet Take 1 tablet (8 mg total) by mouth every 8 (eight) hours as needed  for nausea or vomiting. 01/12/20  Yes Tanner, Lyndon Code., PA-C  Oxycodone HCl 10 MG TABS  Take 10 mg by mouth 4 (four) times daily as needed (pain).  01/26/20  Yes [provider]  Probiotic Product (PROBIOTIC PO) Take 1 capsule by mouth daily.   Yes [provider]  promethazine (PHENERGAN) 25 MG tablet Take 25 mg by mouth every 6 (six) hours as needed for nausea or vomiting.  02/03/20  Yes [provider]  scopolamine (TRANSDERM-SCOP) 1 MG/3DAYS Place 1 patch onto the skin every 3 (three) days.  02/06/20  Yes [provider]  VITAMIN E PO Take 1 tablet by mouth See admin instructions. 2-3 times a month   Yes [provider]  atenolol (TENORMIN) 25 MG tablet Take 1 tablet (25 mg total) by mouth daily. Patient not taking: Reported on 02/14/2020 01/08/19   Lattie Haw, MD  diclofenac sodium (VOLTAREN) 1 % GEL Apply 2 g topically 4 (four) times daily as needed. Patient not taking: Reported on 02/14/2020 01/26/18   Bufford Lope, DO  hydrochlorothiazide (HYDRODIURIL) 25 MG tablet Take 1 tablet (25 mg total) by mouth daily. Patient not taking: Reported on 02/14/2020 03/02/19   Lattie Haw, MD  loratadine (CLARITIN) 10 MG tablet Take 1 tablet (10 mg total) by mouth daily as needed for allergies. Patient not taking: Reported on 02/14/2020 09/23/12   Waldemar Dickens, MD  LORazepam (ATIVAN) 0.5 MG tablet Take 1 tablet (0.5 mg total) by mouth every 4 (four) hours as needed for anxiety (or nausea that is not controlled by other medications). Patient not taking: Reported on 02/14/2020 01/15/20   Micheline Rough, MD  metFORMIN (GLUCOPHAGE) 500 MG tablet TAKE 2 TABLETS TWICE DAILY WITH MEALS Patient not taking: Reported on 02/14/2020 03/02/19   Lattie Haw, MD  metoCLOPramide (REGLAN) 5 MG tablet Take 1 tablet (5 mg total) by mouth 4 (four) times daily -  before meals and at bedtime. Patient not taking: Reported on 02/14/2020 01/15/20   Micheline Rough, MD  Na Sulfate-K Sulfate-Mg Sulf (SUPREP BOWEL PREP KIT) 17.5-3.13-1.6 GM/177ML SOLN Take 1 kit by mouth as  directed. Patient not taking: Reported on 01/15/2020 07/21/19   [name], MD  oxyCODONE (OXY IR/ROXICODONE) 5 MG immediate release tablet Take 0.5-1 tablets (2.5-5 mg total) by mouth every 8 (eight) hours as needed for severe pain. Patient not taking: Reported on 02/14/2020 01/06/20   Truitt Merle, MD  potassium chloride (KLOR-CON) 10 MEQ tablet Take 1 tablet (10 mEq total) by mouth daily. Patient not taking: Reported on 02/14/2020 01/10/20   Truitt Merle, MD  RIVAROXABAN Alveda Reasons) VTE STARTER PACK (15 & 20 MG TABLETS) Follow package directions: Take one 35m tablet by mouth twice a day. On day 22, switch to one 245mtablet once a day. Take with food. Patient not taking: Reported on 02/14/2020 08/26/19   FeTruitt MerleMD  traMADol (ULTRAM) 50 MG tablet Take 1 tablet (50 mg total) by mouth every 6 (six) hours as needed. Patient not taking: Reported on 01/15/2020 12/20/19   FeTruitt MerleMD   Allergies  Allergen Reactions  . Other     Propofol - Anesthesia for MRI--made her "breathe funny"  . Propofol Other (See Comments)    "It makes me feel like I'm on fire."   Review of Systems Complains of vomiting, complains of abdominal pain Physical Exam Frail, weak appearing lady Chronically ill-appearing Patient appears with generalized weakness Trace edema S1-S2 Regular work of breathing Awake, answers questions  appropriately Abdomen not distended  Vital Signs: BP (!) 186/77 (BP Location: Left Arm)   Pulse 77   Temp 98.2 F (36.8 C) (Axillary)   Resp 12   SpO2 98%  Pain Scale: 0-10   Pain Score: Asleep   SpO2: SpO2: 98 % O2 Device:SpO2: 98 % O2 Flow Rate: .   IO: Intake/output summary:   Intake/Output Summary (Last 24 hours) at 02/16/2020 1056 Last data filed at 02/16/2020 1751 Gross per 24 hour  Intake 1513.1 ml  Output --  Net 1513.1 ml    LBM:   Baseline Weight:   Most recent weight:       Palliative Assessment/Data:   Palliative performance scale 40%  Time In:  10 Time  Out:  11 Time Total:  60 Greater than 50%  of this time was spent counseling and coordinating care related to the above assessment and plan.  Signed by: Loistine Chance, MD   Please contact Palliative Medicine Team phone at [phone] for questions and concerns.  For individual provider: See Shea Evans

## 2020-02-16 NOTE — Progress Notes (Signed)
Bonnie Peterson   DOB:Aug 30, 1945   OF#:[id number]   GPQ#:[id number]  Oncology follow up   Subjective: Patient overall feels better, still has intermittent nausea.  Feels fatigued.  Her son was at the bedside.  I spoke with her daughter [name] on the phone.   Objective:  Vitals:   02/15/20 1958 02/16/20 0358  BP: 132/66 (!) 186/77  Pulse: 81 77  Resp: 15 12  Temp: 98.8 F (37.1 C) 98.2 F (36.8 C)  SpO2: 96% 98%    There is no height or weight on file to calculate BMI.  Intake/Output Summary (Last 24 hours) at 02/16/2020 1655 Last data filed at 02/16/2020 1548 Gross per 24 hour  Intake 1853.48 ml  Output --  Net 1853.48 ml     Sclerae unicteric  Oropharynx clear  No peripheral adenopathy  Lungs clear -- no rales or rhonchi  Heart regular rate and rhythm  Abdomen soft, not distended   MSK no focal spinal tenderness, no peripheral edema  Neuro nonfocal   CBG (last 3)  Recent Labs    02/16/20 0745 02/16/20 1059  GLUCAP 148* 122*     Labs:  Urine Studies No results for input(s): UHGB, CRYS in the last 72 hours.  Invalid input(s): UACOL, UAPR, USPG, UPH, UTP, UGL, UKET, UBIL, UNIT, UROB, ULEU, UEPI, UWBC, URBC, UBAC, CAST, [place], [place]  Basic Metabolic Panel: Recent Labs  Lab 02/14/20 1112 02/14/20 1112 02/14/20 1307 02/15/20 0417 02/16/20 0413  NA 145  --   --  139 137  K 3.0*   < >  --  3.0* 3.4*  CL 98  --   --  99 101  CO2 33*  --   --  30 28  GLUCOSE 103*  --   --  121* 120*  BUN 20  --   --  16 10  CREATININE 0.73  --   --  0.68 0.54  CALCIUM 8.8*  --   --  8.3* 8.2*  MG  --   --  2.2  --  2.0   < > = values in this interval not displayed.   GFR CrCl cannot be calculated (Unknown ideal weight.). Liver Function Tests: Recent Labs  Lab 02/14/20 1112 02/15/20 0417  AST 34 33  ALT 17 17  ALKPHOS 78 76  BILITOT 1.2 0.7  PROT 6.9 6.1*  ALBUMIN 3.1* 2.8*   Recent Labs  Lab 02/14/20 1112  LIPASE 20   No results for input(s): AMMONIA in the  last 168 hours. Coagulation profile No results for input(s): INR, PROTIME in the last 168 hours.  CBC: Recent Labs  Lab 02/14/20 1112 02/15/20 0417 02/16/20 0413  WBC 5.7 4.7 5.2  HGB 12.2 11.6* 12.1  HCT 39.5 38.1 38.0  MCV 94.3 94.8 93.1  PLT 136* 110* 111*   Cardiac Enzymes: No results for input(s): CKTOTAL, CKMB, CKMBINDEX, TROPONINI in the last 168 hours. BNP: Invalid input(s): POCBNP CBG: Recent Labs  Lab 02/16/20 0745 02/16/20 1059  GLUCAP 148* 122*   D-Dimer No results for input(s): DDIMER in the last 72 hours. Hgb A1c No results for input(s): HGBA1C in the last 72 hours. Lipid Profile No results for input(s): CHOL, HDL, LDLCALC, TRIG, CHOLHDL, LDLDIRECT in the last 72 hours. Thyroid function studies No results for input(s): TSH, T4TOTAL, T3FREE, THYROIDAB in the last 72 hours.  Invalid input(s): FREET3 Anemia work up No results for input(s): VITAMINB12, FOLATE, FERRITIN, TIBC, IRON, RETICCTPCT in the last 72 hours. Microbiology Recent Results (  from the past 240 hour(s))  Respiratory Panel by RT PCR (Flu A&B, Covid) - Nasopharyngeal Swab     Status: None   Collection Time: 02/14/20  3:45 PM   Specimen: Nasopharyngeal Swab  Result Value Ref Range Status   SARS Coronavirus 2 by RT PCR NEGATIVE NEGATIVE Final    Comment: (NOTE) SARS-CoV-2 target nucleic acids are NOT DETECTED.  The SARS-CoV-2 RNA is generally detectable in upper respiratoy specimens during the acute phase of infection. The lowest concentration of SARS-CoV-2 viral copies this assay can detect is 131 copies/mL. A negative result does not preclude SARS-Cov-2 infection and should not be used as the sole basis for treatment or other patient management decisions. A negative result may occur with  improper specimen collection/handling, submission of specimen other than nasopharyngeal swab, presence of viral mutation(s) within the areas targeted by this assay, and inadequate number of viral  copies (<131 copies/mL). A negative result must be combined with clinical observations, patient history, and epidemiological information. The expected result is Negative.  Fact Sheet for Patients:  PinkCheek.be  Fact Sheet for Healthcare Providers:  GravelBags.it  This test is no t yet approved or cleared by the [place] FDA and  has been authorized for detection and/or diagnosis of SARS-CoV-2 by FDA under an Emergency Use Authorization (EUA). This EUA will remain  in effect (meaning this test can be used) for the duration of the COVID-19 declaration under Section 564(b)(1) of the Act, 21 U.S.C. section 360bbb-3(b)(1), unless the authorization is terminated or revoked sooner.     Influenza A by PCR NEGATIVE NEGATIVE Final   Influenza B by PCR NEGATIVE NEGATIVE Final    Comment: (NOTE) The Xpert Xpress SARS-CoV-2/FLU/RSV assay is intended as an aid in  the diagnosis of influenza from Nasopharyngeal swab specimens and  should not be used as a sole basis for treatment. Nasal washings and  aspirates are unacceptable for Xpert Xpress SARS-CoV-2/FLU/RSV  testing.  Fact Sheet for Patients: PinkCheek.be  Fact Sheet for Healthcare Providers: GravelBags.it  This test is not yet approved or cleared by the [place] FDA and  has been authorized for detection and/or diagnosis of SARS-CoV-2 by  FDA under an Emergency Use Authorization (EUA). This EUA will remain  in effect (meaning this test can be used) for the duration of the  Covid-19 declaration under Section 564(b)(1) of the Act, 21  U.S.C. section 360bbb-3(b)(1), unless the authorization is  terminated or revoked. Performed at [facility], [place] [address] [place], [place] [address]       Studies:  DG Abd Portable 1V  Result Date: 02/15/2020 CLINICAL DATA:  Cholangiocarcinoma,  nausea, vomiting EXAM: PORTABLE ABDOMEN - 1 VIEW COMPARISON:  Portable exam 1138 hours compared to 01/14/2020 FINDINGS: Nonobstructive bowel gas pattern. Paucity of bowel gas in the upper abdomen. No bowel wall thickening or dilatation. Degenerative disc disease changes lumbar spine. Surgical clips in upper to mid abdomen. IMPRESSION: No acute abnormalities. Electronically Signed   By: Lavonia Dana M.D.   On: 02/15/2020 12:19    Assessment: 74 y.o. with metastatic cholangiocarcinoma, currently under hospice care, persistent nausea and vomiting.  1.  Refractory nausea and vomiting, probably secondary to gastric outlet obstruction from her metastatic cancer 2. Metastatic cholangiocarcinoma 3. Severe protein and calori malnutrition  4. HTN  5. Hypokalemia   Plan:  -appreciate palliative care input.  -she is overall better. I reviewed her lab and x-ray, she probably does not need venting G-tube for now  -I  talked to her son in person and her daughter [name] on the phone. [name] still wants [place] to fix her symptoms but unfortunately it is not fixable but manageable. I recommend clear liquid diet only, pt still wants to eat food. -pt plans to go home with hospice.  I am afraid she will back to ED if she does not follow diet recommendation and antiemetics.    Truitt Merle, MD 02/16/2020  4:55 PM

## 2020-02-16 NOTE — Progress Notes (Signed)
PROGRESS NOTE  Bonnie Peterson YJE:[id number] DOB: 1945-09-29 DOA: 02/14/2020 PCP: Lattie Haw, MD   LOS: 1 day   Brief Narrative / Interim history: 74 year old female with intrahepatic adenocarcinoma with metastatic disease, came into the ED with intractable nausea and vomiting along with abdominal pain.  This has been going on for at least 3 weeks, progressively getting worse.  She has used Reglan, scopolamine, Phenergan at home but symptoms have worsened and came to the hospital.  She has hospice services at home but not comfort care at this point.  Oncology has been evaluated patient also, and it seems like her refractory nausea and vomiting are probably secondary to gastric outlet obstruction from her metastatic cancer, she is not a candidate for chemotherapy anymore.  Palliative care also consulted.  Subjective / 24h Interval events: Complains of persistent symptoms this morning  Assessment & Plan: Principal Problem Intractable nausea and vomiting -Possibly gastric outlet obstruction per oncology notes, however CT scan few days prior to admission showed mural thickening of the distal stomach, predominantly involving the antrum with a somewhat masslike appearance.  Thickening extends into the pyloric region as well as the duodenal bulb, possibly sequela of prior radiation therapy versus underlying mass.  CT scan showed in addition metastatic disease in her liver as well as progressive lymphadenopathy in the upper abdomen and retroperitoneum, with metastatic burden getting worse.  Active Problems Widely metastatic cholangiocarcinoma -Not a candidate for further treatment.  Palliative consulted  Hypokalemia -Due to poor p.o. intake, supplement and follow  Essential hypertension -Blood pressure currently acceptable  Type 2 diabetes mellitus -With poor p.o. intake avoid strict CBG control  Thrombocytopenia -Possibly related to malignancy, platelets overall stable, no  bleeding  Goals of care -Went back around lunchtime and talked to the patient again as RN reported that she wishes to [place]. She no longer agrees to DNR, and wishes for all interventions to be done short of chest compressions or shocks. She also wants to be intubated if that is needed.  Scheduled Meds: . Chlorhexidine Gluconate Cloth  6 each Topical Daily  . metoCLOPramide (REGLAN) injection  5 mg Intravenous Q6H  . mirtazapine  15 mg Oral QHS   Continuous Infusions: . dextrose 5 % and 0.9 % NaCl with KCl 40 mEq/L 50 mL/hr at 02/15/20 2032   PRN Meds:.haloperidol, HYDROmorphone (DILAUDID) injection, LORazepam, promethazine  Diet Orders (From admission, onward)    Start     Ordered   02/14/20 1844  Diet clear liquid Room service appropriate? Yes; Fluid consistency: Thin  Diet effective now       Question Answer Comment  Room service appropriate? Yes   Fluid consistency: Thin      02/14/20 1843          DVT prophylaxis: SCDs Start: 02/14/20 1844     Code Status: DNR  Family Communication: son at bedside   Status is: Inpatient  Remains inpatient appropriate because:Ongoing active pain requiring inpatient pain management, IV treatments appropriate due to intensity of illness or inability to take PO and Inpatient level of care appropriate due to severity of illness   Dispo: The patient is from: Home              Anticipated d/c is to: Home              Anticipated d/c date is: 3 days              Patient currently is not medically stable to  d/c.   Consultants:  Palliative care   Procedures:  None   Microbiology  None   Antimicrobials: None     Objective: Vitals:   02/15/20 0614 02/15/20 1439 02/15/20 1958 02/16/20 0358  BP: (!) 154/77 139/69 132/66 (!) 186/77  Pulse: 70 79 81 77  Resp: 16 14 15 12   Temp: 98.1 F (36.7 C) 98.3 F (36.8 C) 98.8 F (37.1 C) 98.2 F (36.8 C)  TempSrc:  Oral Oral Axillary  SpO2: 95% 96% 96% 98%     Intake/Output Summary (Last 24 hours) at 02/16/2020 1057 Last data filed at 02/16/2020 7014 Gross per 24 hour  Intake 1513.1 ml  Output --  Net 1513.1 ml   There were no vitals filed for this visit.  Examination:  Constitutional: NAD Eyes: no scleral icterus ENMT: Mucous membranes are moist.  Neck: normal, supple Respiratory: clear to auscultation bilaterally, no wheezing, no crackles.  Cardiovascular: Regular rate and rhythm, no murmurs / rubs / gallops. No LE edema. Good peripheral pulses Abdomen: non distended, no tenderness. Bowel sounds positive.  Musculoskeletal: no clubbing / cyanosis.  Skin: no rashes Neurologic: non focal   Data Reviewed: I have independently reviewed following labs and imaging studies   CBC: Recent Labs  Lab 02/14/20 1112 02/15/20 0417 02/16/20 0413  WBC 5.7 4.7 5.2  HGB 12.2 11.6* 12.1  HCT 39.5 38.1 38.0  MCV 94.3 94.8 93.1  PLT 136* 110* 103*   Basic Metabolic Panel: Recent Labs  Lab 02/14/20 1112 02/14/20 1307 02/15/20 0417 02/16/20 0413  NA 145  --  139 137  K 3.0*  --  3.0* 3.4*  CL 98  --  99 101  CO2 33*  --  30 28  GLUCOSE 103*  --  121* 120*  BUN 20  --  16 10  CREATININE 0.73  --  0.68 0.54  CALCIUM 8.8*  --  8.3* 8.2*  MG  --  2.2  --  2.0   Liver Function Tests: Recent Labs  Lab 02/14/20 1112 02/15/20 0417  AST 34 33  ALT 17 17  ALKPHOS 78 76  BILITOT 1.2 0.7  PROT 6.9 6.1*  ALBUMIN 3.1* 2.8*   Coagulation Profile: No results for input(s): INR, PROTIME in the last 168 hours. HbA1C: No results for input(s): HGBA1C in the last 72 hours. CBG: Recent Labs  Lab 02/16/20 0745  GLUCAP 148*    Recent Results (from the past 240 hour(s))  Respiratory Panel by RT PCR (Flu A&B, Covid) - Nasopharyngeal Swab     Status: None   Collection Time: 02/14/20  3:45 PM   Specimen: Nasopharyngeal Swab  Result Value Ref Range Status   SARS Coronavirus 2 by RT PCR NEGATIVE NEGATIVE Final    Comment:  (NOTE) SARS-CoV-2 target nucleic acids are NOT DETECTED.  The SARS-CoV-2 RNA is generally detectable in upper respiratoy specimens during the acute phase of infection. The lowest concentration of SARS-CoV-2 viral copies this assay can detect is 131 copies/mL. A negative result does not preclude SARS-Cov-2 infection and should not be used as the sole basis for treatment or other patient management decisions. A negative result may occur with  improper specimen collection/handling, submission of specimen other than nasopharyngeal swab, presence of viral mutation(s) within the areas targeted by this assay, and inadequate number of viral copies (<131 copies/mL). A negative result must be combined with clinical observations, patient history, and epidemiological information. The expected result is Negative.  Fact Sheet for Patients:  PinkCheek.be  Fact Sheet for Healthcare Providers:  GravelBags.it  This test is no t yet approved or cleared by the [place] FDA and  has been authorized for detection and/or diagnosis of SARS-CoV-2 by FDA under an Emergency Use Authorization (EUA). This EUA will remain  in effect (meaning this test can be used) for the duration of the COVID-19 declaration under Section 564(b)(1) of the Act, 21 U.S.C. section 360bbb-3(b)(1), unless the authorization is terminated or revoked sooner.     Influenza A by PCR NEGATIVE NEGATIVE Final   Influenza B by PCR NEGATIVE NEGATIVE Final    Comment: (NOTE) The Xpert Xpress SARS-CoV-2/FLU/RSV assay is intended as an aid in  the diagnosis of influenza from Nasopharyngeal swab specimens and  should not be used as a sole basis for treatment. Nasal washings and  aspirates are unacceptable for Xpert Xpress SARS-CoV-2/FLU/RSV  testing.  Fact Sheet for Patients: PinkCheek.be  Fact Sheet for Healthcare  Providers: GravelBags.it  This test is not yet approved or cleared by the [place] FDA and  has been authorized for detection and/or diagnosis of SARS-CoV-2 by  FDA under an Emergency Use Authorization (EUA). This EUA will remain  in effect (meaning this test can be used) for the duration of the  Covid-19 declaration under Section 564(b)(1) of the Act, 21  U.S.C. section 360bbb-3(b)(1), unless the authorization is  terminated or revoked. Performed at [facility], [place] [address], [place], [place] [address]      Radiology Studies: DG Abd Portable 1V  Result Date: 02/15/2020 CLINICAL DATA:  Cholangiocarcinoma, nausea, vomiting EXAM: PORTABLE ABDOMEN - 1 VIEW COMPARISON:  Portable exam 1138 hours compared to 01/14/2020 FINDINGS: Nonobstructive bowel gas pattern. Paucity of bowel gas in the upper abdomen. No bowel wall thickening or dilatation. Degenerative disc disease changes lumbar spine. Surgical clips in upper to mid abdomen. IMPRESSION: No acute abnormalities. Electronically Signed   By: Lavonia Dana M.D.   On: 02/15/2020 12:19     Time spent: 35 minutes more than 50% at bedside, 2 separate visits   Marzetta Board, MD, PhD [facility]  Between 7 am - 7 pm I am available, please contact me via Amion or Securechat  Between 7 pm - 7 am I am not available, please contact night coverage MD/APP via Amion

## 2020-02-16 NOTE — Progress Notes (Signed)
Bonnie Peterson [address] [facility] Hospitalized Hospice Patient  Bonnie Peterson is a current hospice patient with a terminal diagnosis of intrahepatic cholangiocarcinoma per Dr. Cherie Peterson, [facility] MD. Patient reports more than a week of intractable nausea and vomiting (N/V) despite multiple [facility] case manager visits and adjustments to medications.  EMS was activated by family after making hospice triage RN aware. She under observation for intractable nausea and vomiting. Per Dr. Karie Peterson this is a related admission.  Visited patient at beside. She was sleeping and did not wake to my voice. No visitors at bedside. Respirations even and unlabored; NAD noted.   VS: 98.2, 186/77, 77, 12, 98% RA Abnormal Labs:  02/16/2020 04:13 Potassium: 3.4 (L) Glucose: 120 (H) Calcium: 8.2 (L) Platelets: 111 (L) Glucose: 120 (H)  IVs/PRNs: D5-0.9% NS with 40 mEq K@ 29m/hr continuous infusion;   Ativan 0.5 mg q4h PRN anxiety ordered (no dodes yet given); Compazine 170mPIV PRN N/V @0332 , Phenergan 6.2527mV @1542   Problem List: Intractable N/V IV Fluids for rehydration IV antiemetics Hypokalemia IV K replacement Hypertension  GOC: DNR, although family is not reconciled to lack of treatment options D/C planning: home once optimized Family: Spoke with daughter Bonnie Peterson phone. She has lots of concerns regarding managing her mothers symptoms when she returns home.  IDT: Updated hospice team  Bonnie GordonN, CCM       [facility]     [phone]

## 2020-02-17 ENCOUNTER — Encounter (HOSPITAL_COMMUNITY): Payer: Self-pay | Admitting: Internal Medicine

## 2020-02-17 ENCOUNTER — Inpatient Hospital Stay (HOSPITAL_COMMUNITY)

## 2020-02-17 DIAGNOSIS — C787 Secondary malignant neoplasm of liver and intrahepatic bile duct: Secondary | ICD-10-CM | POA: Diagnosis not present

## 2020-02-17 DIAGNOSIS — Z7189 Other specified counseling: Secondary | ICD-10-CM | POA: Diagnosis not present

## 2020-02-17 DIAGNOSIS — R112 Nausea with vomiting, unspecified: Secondary | ICD-10-CM | POA: Diagnosis not present

## 2020-02-17 DIAGNOSIS — C221 Intrahepatic bile duct carcinoma: Secondary | ICD-10-CM | POA: Diagnosis not present

## 2020-02-17 DIAGNOSIS — Z515 Encounter for palliative care: Secondary | ICD-10-CM | POA: Diagnosis not present

## 2020-02-17 LAB — COMPREHENSIVE METABOLIC PANEL
ALT: 21 U/L (ref 0–44)
AST: 38 U/L (ref 15–41)
Albumin: 2.5 g/dL — ABNORMAL LOW (ref 3.5–5.0)
Alkaline Phosphatase: 75 U/L (ref 38–126)
Anion gap: 6 (ref 5–15)
BUN: 8 mg/dL (ref 8–23)
CO2: 28 mmol/L (ref 22–32)
Calcium: 8.5 mg/dL — ABNORMAL LOW (ref 8.9–10.3)
Chloride: 104 mmol/L (ref 98–111)
Creatinine, Ser: 0.6 mg/dL (ref 0.44–1.00)
GFR, Estimated: >60 mL/min (ref >60)
Glucose, Bld: 170 mg/dL — ABNORMAL HIGH (ref 70–99)
Potassium: 4.3 mmol/L (ref 3.5–5.1)
Sodium: 138 mmol/L (ref 135–145)
Total Bilirubin: 1 mg/dL (ref 0.3–1.2)
Total Protein: 6.2 g/dL — ABNORMAL LOW (ref 6.5–8.1)

## 2020-02-17 LAB — CBC
HCT: 36 % (ref 36.0–46.0)
Hemoglobin: 11.3 g/dL — ABNORMAL LOW (ref 12.0–15.0)
MCH: 29.6 pg (ref 26.0–34.0)
MCHC: 31.4 g/dL (ref 30.0–36.0)
MCV: 94.2 fL (ref 80.0–100.0)
Platelets: 102 10*3/uL — ABNORMAL LOW (ref 150–400)
RBC: 3.82 MIL/uL — ABNORMAL LOW (ref 3.87–5.11)
RDW: 15 % (ref 11.5–15.5)
WBC: 5.5 10*3/uL (ref 4.0–10.5)
nRBC: 0 % (ref 0.0–0.2)

## 2020-02-17 LAB — PHOSPHORUS: Phosphorus: 2.2 mg/dL — ABNORMAL LOW (ref 2.5–4.6)

## 2020-02-17 LAB — GLUCOSE, CAPILLARY
Glucose-Capillary: 146 mg/dL — ABNORMAL HIGH (ref 70–99)
Glucose-Capillary: 130 mg/dL — ABNORMAL HIGH (ref 70–99)
Glucose-Capillary: 144 mg/dL — ABNORMAL HIGH (ref 70–99)

## 2020-02-17 LAB — MAGNESIUM: Magnesium: 1.8 mg/dL (ref 1.7–2.4)

## 2020-02-17 MED ORDER — CLONIDINE HCL 0.2 MG/24HR TD PTWK
0.2000 mg | MEDICATED_PATCH | TRANSDERMAL | Status: DC
  Administered 2020-02-17: 0.2 mg via TRANSDERMAL
  Filled 2020-02-17 (×2): qty 1

## 2020-02-17 MED ORDER — SENNOSIDES-DOCUSATE SODIUM 8.6-50 MG PO TABS
2.0000 | ORAL_TABLET | Freq: Two times a day (BID) | ORAL | Status: DC
  Administered 2020-02-17 (×2): 2 via ORAL
  Filled 2020-02-17 (×6): qty 2

## 2020-02-17 MED ORDER — LISINOPRIL 20 MG PO TABS
40.0000 mg | ORAL_TABLET | Freq: Every day | ORAL | Status: DC
  Administered 2020-02-17: 40 mg via ORAL
  Filled 2020-02-17 (×2): qty 2

## 2020-02-17 MED ORDER — AMLODIPINE BESYLATE 10 MG PO TABS
10.0000 mg | ORAL_TABLET | Freq: Every day | ORAL | Status: DC
  Administered 2020-02-17: 10 mg via ORAL
  Filled 2020-02-17: qty 1

## 2020-02-17 MED ORDER — LORAZEPAM 2 MG/ML IJ SOLN
0.5000 mg | INTRAMUSCULAR | Status: DC | PRN
  Administered 2020-02-17: 0.5 mg via INTRAVENOUS
  Filled 2020-02-17: qty 1

## 2020-02-17 MED ORDER — POLYETHYLENE GLYCOL 3350 17 G PO PACK
17.0000 g | PACK | Freq: Every day | ORAL | Status: DC
  Administered 2020-02-17: 17 g via ORAL
  Filled 2020-02-17 (×3): qty 1

## 2020-02-17 MED ORDER — IOHEXOL 300 MG/ML  SOLN
100.0000 mL | Freq: Once | INTRAMUSCULAR | Status: AC | PRN
  Administered 2020-02-17: 100 mL via INTRAVENOUS

## 2020-02-17 MED ORDER — HYDRALAZINE HCL 25 MG PO TABS
25.0000 mg | ORAL_TABLET | Freq: Four times a day (QID) | ORAL | Status: DC | PRN
  Administered 2020-02-17: 25 mg via ORAL
  Filled 2020-02-17: qty 1

## 2020-02-17 MED ORDER — ATENOLOL 25 MG PO TABS: 25.0000 mg | ORAL_TABLET | Freq: Every day | ORAL | Status: DC

## 2020-02-17 MED ORDER — SODIUM CHLORIDE 0.9% FLUSH: 10.0000 mL | INTRAVENOUS | Status: DC | PRN

## 2020-02-17 MED ORDER — HYDRALAZINE HCL 20 MG/ML IJ SOLN
10.0000 mg | INTRAMUSCULAR | Status: DC | PRN
  Administered 2020-02-17 – 2020-02-21 (×6): 10 mg via INTRAVENOUS
  Filled 2020-02-17 (×6): qty 1

## 2020-02-17 NOTE — Progress Notes (Signed)
Daily Progress Note   Patient Name: Bonnie Peterson       Date: 02/17/2020 DOB: 02-21-1946  Age: 74 y.o. MRN#: [id number] Attending Physician: Bonnie Griffins, MD Primary Care Physician: Bonnie Haw, MD Admit Date: 02/14/2020  Reason for Consultation/Follow-up: Establishing goals of care  Subjective: Patient asks," do I have a choice?"  My choice is to go home.  Length of Stay: 2  Current Medications: Scheduled Meds:  . amLODipine  10 mg Oral Daily  . Chlorhexidine Gluconate Cloth  6 each Topical Daily  . lisinopril  40 mg Oral Daily  . metoCLOPramide (REGLAN) injection  5 mg Intravenous Q6H  . mirtazapine  15 mg Oral QHS  . polyethylene glycol  17 g Oral Daily  . senna-docusate  2 tablet Oral BID    Continuous Infusions: . dextrose 5 % and 0.9 % NaCl with KCl 40 mEq/L 50 mL/hr at 02/17/20 0200    PRN Meds: haloperidol, HYDROmorphone (DILAUDID) injection, LORazepam, promethazine, sodium chloride flush  Physical Exam         Awake alert resting in bed Regular work of breathing S1-S2 No significant peripheral edema Nonfocal Abdomen soft nondistended  Vital Signs: BP (!) 200/82 (BP Location: Left Arm)   Pulse 93   Temp 98 F (36.7 C) (Oral)   Resp 18   Ht 5' 3"  (1.6 m)   Wt 59 kg   SpO2 98%   BMI 23.04 kg/m  SpO2: SpO2: 98 % O2 Device: O2 Device: Room Air O2 Flow Rate:    Intake/output summary:   Intake/Output Summary (Last 24 hours) at 02/17/2020 1121 Last data filed at 02/17/2020 0200 Gross per 24 hour  Intake 1444.32 ml  Output --  Net 1444.32 ml   LBM: Last BM Date:  ("two weeks ago") Baseline Weight: Weight: 59 kg Most recent weight: Weight: 59 kg       Palliative Assessment/Data:   Palliative performance scale 40%  Patient Active Problem  List   Diagnosis Date Noted  . Cholangiocarcinoma ([place]) 02/15/2020  . Intractable nausea and vomiting 01/14/2020  . Rectal bleeding   . Depression 07/19/2019  . Goals of care, counseling/discussion 06/11/2019  . Blood in stool 06/11/2019  . Acute pain of left lower extremity 01/05/2019  . Gastroesophageal reflux disease without esophagitis 06/03/2018  . Lung nodules  05/04/2018  . Type 2 diabetes mellitus without complication, without long-term current use of insulin ([place]) 01/05/2017  . Healthcare maintenance 01/24/2016  . Back pain 11/23/2015  . Port catheter in place 11/10/2015  . 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 History of resection of liver 02/10/2015  . 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 Inadequate sleep hygiene 08/22/2014  . 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 IBS (irritable bowel syndrome) 03/22/2013  . Liver mass, left lobe 02/24/2013  . Osteoporosis 10/15/2012  . Vitamin D deficiency 12/23/2011  . Cigarette smoker 10/12/2010  . Chronic obstructive pulmonary disease ([place]) 06/14/2010  . Obstructive sleep apnea 04/27/2009  . OBESITY, UNSPECIFIED 04/26/2009  . Hyperlipidemia 11/25/2007  . GOUT NOS 12/24/2006  . Recurrent major depressive disorder ([place]) 06/05/2006  . Anxiety 06/05/2006  . Tobacco use 06/05/2006  . HYPERTENSION, BENIGN SYSTEMIC 06/05/2006  . Osteoarthritis 06/05/2006  . Generalized anxiety disorder 06/05/2006    Palliative Care Assessment & Plan   Patient Profile:    Assessment: Bonnie Peterson is 74 years old.  She has life limiting illness of metastatic cholangiocarcinoma.  She is currently under hospice care.  She has been admitted to the hospital for intractable symptoms of persistent nausea vomiting.  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  She has hypertension and severe protein calorie malnutrition due to her  life limiting illness of malignancy. Goals of care discussions: Discussed with the patient face-to-face in detail Call placed and discussed with patient's daughter Bonnie Peterson.  Goals wishes and values important to the patient and family attempted to be explored.  Patient states that she wants to go home.  Bonnie Peterson states that the patient is not able to tolerate by mouth medications medications.   Patient and Bonnie Peterson state that rectal route is uncomfortable, for pain and nausea/vomiting medications. As such, discharge back home with hospice support will not meet her symptom management needs.   We then discussed about residential hospice, for symptom management and for using IV medications for pain and nausea/vomiting. Bonnie Peterson states she will discuss further with patient and with her siblings.   Bonnie Peterson states that the patient is not actively dying, she states that the patient still has a will to live more, that she is awake alert, taking in POs and that she is not somnolent. She states that she doesn't want the patient "druggged up and laying there until she dies, coz that's not going to happen to my mama."  I discussed with her about the scope of care inside a residential hospice setting and encouraged her to consider. PMT to follow.   Recommendations/Plan:    Goals of Care and Additional Recommendations: Limitations on Scope of Treatment: Avoid Hospitalization  Code Status:    Code Status Orders  (From admission, onward)         Start     Ordered   02/16/20 1058  Limited resuscitation (code)  Continuous       Question Answer Comment  In the event of cardiac or respiratory ARREST: Initiate Code Blue, Call Rapid Response Yes   In the event of cardiac or respiratory ARREST: Perform CPR No   In the event of cardiac or respiratory ARREST: Perform Intubation/Mechanical Ventilation Yes   In the event of cardiac or respiratory ARREST: Use NIPPV/BiPAp only if indicated Yes   In the event of cardiac or  respiratory ARREST: Administer ACLS medications if indicated Yes  In the event of cardiac or respiratory ARREST: Perform Defibrillation or Cardioversion if indicated No   Comments as per family request.      02/16/20 1057        Code Status History    Date Active Date Inactive Code Status Order ID Comments User Context   02/14/2020 1844 02/16/2020 1057 DNR [id number]  Jonnie Finner, DO Inpatient   01/14/2020 1759 01/15/2020 2117 DNR [id number]  Jonnie Finner, DO Inpatient   09/07/2015 1122 09/08/2015 0321 Full Code [id number]  Arne Cleveland, [place]   06/14/2015 1754 06/16/2015 1611 Full Code [id number]  Modena Jansky, MD Inpatient   10/04/2014 1412 10/05/2014 0321 Full Code [id number]  Markus Daft, MD [facility]   Advance Care Planning Activity    Advance Directive Documentation     Most Recent Value  Type of Advance Directive Healthcare Power of Attorney  Pre-existing out of facility DNR order (yellow form or pink MOST form) --  "MOST" Form in Place? --      Prognosis:  guarded  Discharge Planning: To Be Determined  Care plan was discussed with  Patient, daughter Bonnie Peterson.   Thank you for allowing the Palliative Medicine Team to assist in the care of this patient.   Time In:  10 Time Out: 10.35 Total Time 35 Prolonged Time Billed  no       Greater than 50%  of this time was spent counseling and coordinating care related to the above assessment and plan.  Loistine Chance, MD  Please contact Palliative Medicine Team phone at [phone] for questions and concerns.

## 2020-02-17 NOTE — Progress Notes (Signed)
PROGRESS NOTE  Bonnie Peterson AQT:[id number] DOB: [birth year] DOA: 02/14/2020 PCP: Lattie Haw, MD   LOS: 2 days   Brief Narrative / Interim history: 74 year old female with intrahepatic adenocarcinoma with metastatic disease, came into the ED with intractable nausea and vomiting along with abdominal pain.  This has been going on for at least 3 weeks, progressively getting worse.  She has used Reglan, scopolamine, Phenergan at home but symptoms have worsened and came to the hospital.  She has hospice services at home but not comfort care at this point.  Oncology has been evaluated patient also, and it seems like her refractory nausea and vomiting are probably secondary to gastric outlet obstruction from her metastatic cancer, she is not a candidate for chemotherapy anymore.  Palliative care also consulted.  Subjective / 24h Interval events: Feels like her symptoms are better controlled but still unable to take much p.o. intake.  Assessment & Plan: Principal Problem Intractable nausea and vomiting -Possibly gastric outlet obstruction, CT scan few days prior to admission showed mural thickening of the distal stomach, predominantly involving the antrum with a somewhat masslike appearance.  Thickening extends into the pyloric region as well as the duodenal bulb, possibly sequela of prior radiation therapy versus underlying mass.  CT scan showed in addition metastatic disease in her liver as well as progressive lymphadenopathy in the upper abdomen and retroperitoneum, with metastatic burden getting worse.  Not a candidate for chemotherapy per oncology  Active Problems Widely metastatic cholangiocarcinoma -Not a candidate for further treatment.  Palliative consulted.  Patient and family have difficulties understanding gravity of her condition  Hypokalemia -Due to poor p.o. intake, supplement and follow  Essential hypertension -Blood pressure currently acceptable  Type 2 diabetes mellitus -With  poor p.o. intake avoid strict CBG control  Thrombocytopenia -Possibly related to malignancy, platelets overall stable, no bleeding  Goals of care -Remains partial code, appreciate palliative follow-up  Scheduled Meds: . amLODipine  10 mg Oral Daily  . Chlorhexidine Gluconate Cloth  6 each Topical Daily  . lisinopril  40 mg Oral Daily  . metoCLOPramide (REGLAN) injection  5 mg Intravenous Q6H  . mirtazapine  15 mg Oral QHS  . polyethylene glycol  17 g Oral Daily  . senna-docusate  2 tablet Oral BID   Continuous Infusions: . dextrose 5 % and 0.9 % NaCl with KCl 40 mEq/L 50 mL/hr at 02/17/20 0200   PRN Meds:.haloperidol, HYDROmorphone (DILAUDID) injection, LORazepam, promethazine, sodium chloride flush  Diet Orders (From admission, onward)    Start     Ordered   02/14/20 1844  Diet clear liquid Room service appropriate? Yes; Fluid consistency: Thin  Diet effective now       Question Answer Comment  Room service appropriate? Yes   Fluid consistency: Thin      02/14/20 1843          DVT prophylaxis: SCDs Start: 02/14/20 1844     Code Status: Partial Code  Family Communication: son at bedside   Status is: Inpatient  Remains inpatient appropriate because:Ongoing active pain requiring inpatient pain management, IV treatments appropriate due to intensity of illness or inability to take PO and Inpatient level of care appropriate due to severity of illness   Dispo: The patient is from: Home              Anticipated d/c is to: Home              Anticipated d/c date is: 3 days  Patient currently is not medically stable to d/c.   Consultants:  Palliative care   Procedures:  None   Microbiology  None   Antimicrobials: None     Objective: Vitals:   02/16/20 0358 02/16/20 1952 02/16/20 2200 02/17/20 0341  BP: (!) 186/77 (!) 178/83  (!) 200/82  Pulse: 77 76  93  Resp: 12 (!) 21  18  Temp: 98.2 F (36.8 C) 98.7 F (37.1 C)  98 F (36.7 C)  TempSrc:  Axillary Oral  Oral  SpO2: 98% 98%  98%  Weight:   59 kg   Height:   5' 3"  (1.6 m)     Intake/Output Summary (Last 24 hours) at 02/17/2020 1238 Last data filed at 02/17/2020 0200 Gross per 24 hour  Intake 1444.32 ml  Output --  Net 1444.32 ml   Filed Weights   02/16/20 2200  Weight: 59 kg    Examination:  Constitutional: No distress Eyes: No icterus ENMT: Moist mucous membranes Neck: normal, supple Respiratory: Lungs are clear, no wheezing, no crackles Cardiovascular: Regular rate and rhythm, no murmurs, no peripheral edema Abdomen: Soft, nontender, nondistended, bowel sounds positive Musculoskeletal: no clubbing / cyanosis.  Skin: No rashes seen Neurologic: No focal deficits  Data Reviewed: I have independently reviewed following labs and imaging studies   CBC: Recent Labs  Lab 02/14/20 1112 02/15/20 0417 02/16/20 0413 02/17/20 0612  WBC 5.7 4.7 5.2 5.5  HGB 12.2 11.6* 12.1 11.3*  HCT 39.5 38.1 38.0 36.0  MCV 94.3 94.8 93.1 94.2  PLT 136* 110* 111* 035*   Basic Metabolic Panel: Recent Labs  Lab 02/14/20 1112 02/14/20 1307 02/15/20 0417 02/16/20 0413 02/17/20 0612  NA 145  --  139 137 138  K 3.0*  --  3.0* 3.4* 4.3  CL 98  --  99 101 104  CO2 33*  --  30 28 28   GLUCOSE 103*  --  121* 120* 170*  BUN 20  --  16 10 8   CREATININE 0.73  --  0.68 0.54 0.60  CALCIUM 8.8*  --  8.3* 8.2* 8.5*  MG  --  2.2  --  2.0 1.8  PHOS  --   --   --   --  2.2*   Liver Function Tests: Recent Labs  Lab 02/14/20 1112 02/15/20 0417 02/17/20 0612  AST 34 33 38  ALT 17 17 21   ALKPHOS 78 76 75  BILITOT 1.2 0.7 1.0  PROT 6.9 6.1* 6.2*  ALBUMIN 3.1* 2.8* 2.5*   Coagulation Profile: No results for input(s): INR, PROTIME in the last 168 hours. HbA1C: No results for input(s): HGBA1C in the last 72 hours. CBG: Recent Labs  Lab 02/16/20 0745 02/16/20 1059 02/16/20 1740 02/17/20 0806 02/17/20 1122  GLUCAP 148* 122* 114* 146* 130*    Recent Results (from the  past 240 hour(s))  Respiratory Panel by RT PCR (Flu A&B, Covid) - Nasopharyngeal Swab     Status: None   Collection Time: 02/14/20  3:45 PM   Specimen: Nasopharyngeal Swab  Result Value Ref Range Status   SARS Coronavirus 2 by RT PCR NEGATIVE NEGATIVE Final    Comment: (NOTE) SARS-CoV-2 target nucleic acids are NOT DETECTED.  The SARS-CoV-2 RNA is generally detectable in upper respiratoy specimens during the acute phase of infection. The lowest concentration of SARS-CoV-2 viral copies this assay can detect is 131 copies/mL. A negative result does not preclude SARS-Cov-2 infection and should not be used as the sole basis for treatment or  other patient management decisions. A negative result may occur with  improper specimen collection/handling, submission of specimen other than nasopharyngeal swab, presence of viral mutation(s) within the areas targeted by this assay, and inadequate number of viral copies (<131 copies/mL). A negative result must be combined with clinical observations, patient history, and epidemiological information. The expected result is Negative.  Fact Sheet for Patients:  PinkCheek.be  Fact Sheet for Healthcare Providers:  GravelBags.it  This test is no t yet approved or cleared by the [place] FDA and  has been authorized for detection and/or diagnosis of SARS-CoV-2 by FDA under an Emergency Use Authorization (EUA). This EUA will remain  in effect (meaning this test can be used) for the duration of the COVID-19 declaration under Section 564(b)(1) of the Act, 21 U.S.C. section 360bbb-3(b)(1), unless the authorization is terminated or revoked sooner.     Influenza A by PCR NEGATIVE NEGATIVE Final   Influenza B by PCR NEGATIVE NEGATIVE Final    Comment: (NOTE) The Xpert Xpress SARS-CoV-2/FLU/RSV assay is intended as an aid in  the diagnosis of influenza from Nasopharyngeal swab specimens and    should not be used as a sole basis for treatment. Nasal washings and  aspirates are unacceptable for Xpert Xpress SARS-CoV-2/FLU/RSV  testing.  Fact Sheet for Patients: PinkCheek.be  Fact Sheet for Healthcare Providers: GravelBags.it  This test is not yet approved or cleared by the [place] FDA and  has been authorized for detection and/or diagnosis of SARS-CoV-2 by  FDA under an Emergency Use Authorization (EUA). This EUA will remain  in effect (meaning this test can be used) for the duration of the  Covid-19 declaration under Section 564(b)(1) of the Act, 21  U.S.C. section 360bbb-3(b)(1), unless the authorization is  terminated or revoked. Performed at [facility], [place] [address] [place], [place] [address]      Radiology Studies: No results found.  Marzetta Board, MD, PhD [facility]  Between 7 am - 7 pm I am available, please contact me via Amion or Securechat  Between 7 pm - 7 am I am not available, please contact night coverage MD/APP via Amion

## 2020-02-17 NOTE — Progress Notes (Signed)
[name] [address] [facility]) Hospitalized Hospice Patient   Ms. Fulco is a current hospice patient with a terminal diagnosis of intrahepatic cholangiocarcinoma per Dr. Cherie Ouch, [facility] MD. Patient reports more than a week of intractable nausea and vomiting (N/V) despite multiple [facility] case manager visits and adjustments to medications.  EMS was activated by family after making hospice triage RN aware. She under observation for intractable nausea and vomiting.  Per Dr. Karie Georges this is a related admission.  Visited patient at beside.  Daughter [name] present for visit.  Pt experienced multiple bouts of emesis during visit, c/o mild pain.  Blood pressure (BP) noted to be >372 systolic for past three readings. Pt reports unable to tolerate PO meds, report taking the meds for BP had caused the last bout of vomiting.  Dr, Cruzita Lederer made aware; new orders for transdermal clonidine patch and PRN hydralazine PIV obtained.  Dr. Ula Lingo came to bedside to answer [name]'s questions about the nature of the blockage in her mother's stomach and possible treatment options.  Dr, Ula Lingo ordered new CT (previous dated 01/14/20) and discussed possibility of a venting PEG.  Pt's initial reaction to suggest of tube placement was strongly against; [name] verbalized interest.  Plan made to reassess after CT results.  Per Dr. Karie Georges PEG placed for palliative venting and N/V management would be consistent with hospice plan of care.  Dr. Danae Chen aware of poor PO tolerance, will consult to order CADD for IV meds as appropriate on discharge given pt's stated goal to return home. VS: 98.9 oral, 199/81, 86 HR, 16 RR, 99%RA Abnormal Labs: Ca 8.5, Albumin 2.5, total prot 6.2, Hgb 11.3, platelets 102 IVs/PRNs: D5-0.9% NS with 40 mEq K@ 43m/hr continuous infusion;   Ativan 0.5 mg q4h PRN anxiety ordered (1 dose); Dilaudid 0.5 mg PIV q2h (2 doses), Phenergan 6.250mIV q6h (2 doses)  Problem List: 1. Intractable N/V a. IV  Fluids for rehydration b. IV antiemetics c. Discussion of possible venting PEG 2. Hypokalemia a. IV K replacement 3. Hypertension a. Dc'ed PO meds b. Started clonidine patch and IV meds PRN  GOC: DNR, although family is not reconciled to lack of treatment options D/C planning: home once optimized, may need to establish pt with CADD for meds Family: Daughter RoShirlean Mylart bedside for visit IDT: Updated hospice team   ChDomenic MorasBSN, RN ACThe Pavilion At Williamsburg Placeiaison [phone] on call)

## 2020-02-18 ENCOUNTER — Inpatient Hospital Stay (HOSPITAL_COMMUNITY): Admitting: Anesthesiology

## 2020-02-18 ENCOUNTER — Encounter (HOSPITAL_COMMUNITY)
Admission: EM | Disposition: A | Discharge status: Hospice | Payer: Self-pay | Source: Home / Self Care | Attending: Internal Medicine

## 2020-02-18 ENCOUNTER — Encounter (HOSPITAL_COMMUNITY): Payer: Self-pay | Admitting: Internal Medicine

## 2020-02-18 DIAGNOSIS — R112 Nausea with vomiting, unspecified: Secondary | ICD-10-CM | POA: Diagnosis not present

## 2020-02-18 DIAGNOSIS — C221 Intrahepatic bile duct carcinoma: Secondary | ICD-10-CM | POA: Diagnosis not present

## 2020-02-18 DIAGNOSIS — R1110 Vomiting, unspecified: Secondary | ICD-10-CM

## 2020-02-18 DIAGNOSIS — K209 Esophagitis, unspecified without bleeding: Secondary | ICD-10-CM

## 2020-02-18 DIAGNOSIS — Z515 Encounter for palliative care: Secondary | ICD-10-CM | POA: Diagnosis not present

## 2020-02-18 DIAGNOSIS — C787 Secondary malignant neoplasm of liver and intrahepatic bile duct: Secondary | ICD-10-CM | POA: Diagnosis not present

## 2020-02-18 DIAGNOSIS — K2100 Gastro-esophageal reflux disease with esophagitis, without bleeding: Secondary | ICD-10-CM

## 2020-02-18 DIAGNOSIS — K3189 Other diseases of stomach and duodenum: Secondary | ICD-10-CM

## 2020-02-18 HISTORY — PX: BIOPSY: SHX5522

## 2020-02-18 HISTORY — PX: ESOPHAGOGASTRODUODENOSCOPY (EGD) WITH PROPOFOL: SHX5813

## 2020-02-18 LAB — COMPREHENSIVE METABOLIC PANEL
ALT: 21 U/L (ref 0–44)
AST: 31 U/L (ref 15–41)
Albumin: 2.5 g/dL — ABNORMAL LOW (ref 3.5–5.0)
Alkaline Phosphatase: 77 U/L (ref 38–126)
Anion gap: 8 (ref 5–15)
BUN: 6 mg/dL — ABNORMAL LOW (ref 8–23)
CO2: 26 mmol/L (ref 22–32)
Calcium: 8.8 mg/dL — ABNORMAL LOW (ref 8.9–10.3)
Chloride: 106 mmol/L (ref 98–111)
Creatinine, Ser: 0.5 mg/dL (ref 0.44–1.00)
GFR, Estimated: >60 mL/min (ref >60)
Glucose, Bld: 165 mg/dL — ABNORMAL HIGH (ref 70–99)
Potassium: 4 mmol/L (ref 3.5–5.1)
Sodium: 140 mmol/L (ref 135–145)
Total Bilirubin: 0.6 mg/dL (ref 0.3–1.2)
Total Protein: 6.3 g/dL — ABNORMAL LOW (ref 6.5–8.1)

## 2020-02-18 LAB — CBC
HCT: 36.5 % (ref 36.0–46.0)
Hemoglobin: 11.5 g/dL — ABNORMAL LOW (ref 12.0–15.0)
MCH: 29 pg (ref 26.0–34.0)
MCHC: 31.5 g/dL (ref 30.0–36.0)
MCV: 92.2 fL (ref 80.0–100.0)
Platelets: 114 10*3/uL — ABNORMAL LOW (ref 150–400)
RBC: 3.96 MIL/uL (ref 3.87–5.11)
RDW: 14.9 % (ref 11.5–15.5)
WBC: 7.2 10*3/uL (ref 4.0–10.5)
nRBC: 0 % (ref 0.0–0.2)

## 2020-02-18 LAB — GLUCOSE, CAPILLARY
Glucose-Capillary: 150 mg/dL — ABNORMAL HIGH (ref 70–99)
Glucose-Capillary: 146 mg/dL — ABNORMAL HIGH (ref 70–99)
Glucose-Capillary: 132 mg/dL — ABNORMAL HIGH (ref 70–99)

## 2020-02-18 LAB — TROPONIN I (HIGH SENSITIVITY): Troponin I (High Sensitivity): 39 ng/L — ABNORMAL HIGH (ref <18)

## 2020-02-18 SURGERY — ESOPHAGOGASTRODUODENOSCOPY (EGD) WITH PROPOFOL
Anesthesia: Monitor Anesthesia Care

## 2020-02-18 MED ORDER — PROPOFOL 10 MG/ML IV BOLUS
INTRAVENOUS | Status: DC | PRN
  Administered 2020-02-18: 20 mg via INTRAVENOUS
  Administered 2020-02-18: 50 mg via INTRAVENOUS
  Administered 2020-02-18: 20 mg via INTRAVENOUS

## 2020-02-18 MED ORDER — SODIUM CHLORIDE 0.9 % IV SOLN: INTRAVENOUS | Status: DC

## 2020-02-18 MED ORDER — LACTATED RINGERS IV SOLN: INTRAVENOUS | Status: DC | PRN

## 2020-02-18 MED ORDER — PHENYLEPHRINE 40 MCG/ML (10ML) SYRINGE FOR IV PUSH (FOR BLOOD PRESSURE SUPPORT)
PREFILLED_SYRINGE | INTRAVENOUS | Status: DC | PRN
  Administered 2020-02-18: 120 ug via INTRAVENOUS

## 2020-02-18 SURGICAL SUPPLY — 15 items

## 2020-02-18 NOTE — Progress Notes (Signed)
Daily Progress Note   Patient Name: Bonnie Peterson       Date: 02/17/2020 DOB: Oct 28, 1945  Age: 74 y.o. MRN#: [id number] Attending Physician: Bonnie Griffins, MD Primary Care Physician: Bonnie Haw, MD Admit Date: 02/14/2020  Reason for Consultation/Follow-up: Establishing goals of care  Subjective: Patient resting in bed, daughter [name] at bedside, patient to go for GI procedure EGD today.   Length of Stay: 2  Current Medications: Scheduled Meds:  . amLODipine  10 mg Oral Daily  . Chlorhexidine Gluconate Cloth  6 each Topical Daily  . lisinopril  40 mg Oral Daily  . metoCLOPramide (REGLAN) injection  5 mg Intravenous Q6H  . mirtazapine  15 mg Oral QHS  . polyethylene glycol  17 g Oral Daily  . senna-docusate  2 tablet Oral BID    Continuous Infusions: . dextrose 5 % and 0.9 % NaCl with KCl 40 mEq/L 50 mL/hr at 02/17/20 0200    PRN Meds: haloperidol, HYDROmorphone (DILAUDID) injection, LORazepam, promethazine, sodium chloride flush  Physical Exam         Awake alert resting in bed Regular work of breathing S1-S2 No significant peripheral edema Nonfocal Abdomen soft nondistended  Vital Signs: BP (!) 200/82 (BP Location: Left Arm)   Pulse 93   Temp 98 F (36.7 C) (Oral)   Resp 18   Ht 5' 3"  (1.6 m)   Wt 59 kg   SpO2 98%   BMI 23.04 kg/m  SpO2: SpO2: 98 % O2 Device: O2 Device: Room Air O2 Flow Rate:    Intake/output summary:   Intake/Output Summary (Last 24 hours) at 02/17/2020 1121 Last data filed at 02/17/2020 0200 Gross per 24 hour  Intake 1444.32 ml  Output --  Net 1444.32 ml   LBM: Last BM Date:  ("two weeks ago") Baseline Weight: Weight: 59 kg Most recent weight: Weight: 59 kg       Palliative Assessment/Data:   Palliative performance  scale 40%  Patient Active Problem List   Diagnosis Date Noted  . Cholangiocarcinoma ([place]) 02/15/2020  . Intractable nausea and vomiting 01/14/2020  . Rectal bleeding   . Depression 07/19/2019  . Goals of care, counseling/discussion 06/11/2019  . Blood in stool 06/11/2019  . Acute pain of left lower extremity 01/05/2019  . Gastroesophageal reflux disease without esophagitis 06/03/2018  .  Lung nodules 05/04/2018  . Type 2 diabetes mellitus without complication, without long-term current use of insulin ([place]) 01/05/2017  . Healthcare maintenance 01/24/2016  . Back pain 11/23/2015  . Port catheter in place 11/10/2015  . 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 History of resection of liver 02/10/2015  . 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 Inadequate sleep hygiene 08/22/2014  . 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 IBS (irritable bowel syndrome) 03/22/2013  . Liver mass, left lobe 02/24/2013  . Osteoporosis 10/15/2012  . Vitamin D deficiency 12/23/2011  . Cigarette smoker 10/12/2010  . Chronic obstructive pulmonary disease ([place]) 06/14/2010  . Obstructive sleep apnea 04/27/2009  . OBESITY, UNSPECIFIED 04/26/2009  . Hyperlipidemia 11/25/2007  . GOUT NOS 12/24/2006  . Recurrent major depressive disorder ([place]) 06/05/2006  . Anxiety 06/05/2006  . Tobacco use 06/05/2006  . HYPERTENSION, BENIGN SYSTEMIC 06/05/2006  . Osteoarthritis 06/05/2006  . Generalized anxiety disorder 06/05/2006    Palliative Care Assessment & Plan   Patient Profile:    Assessment: Bonnie Peterson is 74 years old.  She has life limiting illness of metastatic cholangiocarcinoma.  She is currently under hospice care.  She has been admitted to the hospital for intractable symptoms of persistent nausea vomiting.  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  She has hypertension and severe  protein calorie malnutrition due to her life limiting illness of malignancy.    Recommendations/Plan: EGD today Continue current pain and non pain symptom management.    Goals of Care and Additional Recommendations: Limitations on Scope of Treatment: Avoid Hospitalization  Code Status:    Code Status Orders  (From admission, onward)         Start     Ordered   02/16/20 1058  Limited resuscitation (code)  Continuous       Question Answer Comment  In the event of cardiac or respiratory ARREST: Initiate Code Blue, Call Rapid Response Yes   In the event of cardiac or respiratory ARREST: Perform CPR No   In the event of cardiac or respiratory ARREST: Perform Intubation/Mechanical Ventilation Yes   In the event of cardiac or respiratory ARREST: Use NIPPV/BiPAp only if indicated Yes   In the event of cardiac or respiratory ARREST: Administer ACLS medications if indicated Yes   In the event of cardiac or respiratory ARREST: Perform Defibrillation or Cardioversion if indicated No   Comments as per family request.      02/16/20 1057        Code Status History    Date Active Date Inactive Code Status Order ID Comments User Context   02/14/2020 1844 02/16/2020 1057 DNR [id number]  Jonnie Finner, DO Inpatient   01/14/2020 1759 01/15/2020 2117 DNR [id number]  Jonnie Finner, DO Inpatient   09/07/2015 1122 09/08/2015 0321 Full Code [id number]  Arne Cleveland, [employer]   06/14/2015 1754 06/16/2015 1611 Full Code [id number]  Modena Jansky, MD Inpatient   10/04/2014 1412 10/05/2014 0321 Full Code [id number]  Markus Daft, MD [place] Planning Activity    Advance Directive Documentation     Most Recent Value  Type of Advance Directive Healthcare Power of Attorney  Pre-existing out of facility DNR order (yellow form or pink MOST form) --  "MOST" Form in Place? --      Prognosis:  guarded  Discharge Planning: To Be Determined  Care plan was discussed with  Patient, daughter [name].    Thank you for allowing the Palliative Medicine Team to assist in the care of this patient.   Time In:  10 Time Out: 10.25 Total Time 25 Prolonged Time Billed  no       Greater than 50%  of this time was spent counseling and coordinating care related to the above assessment and plan.  Loistine Chance, MD  Please contact Palliative Medicine Team phone at [phone] for questions and concerns.

## 2020-02-18 NOTE — Transfer of Care (Signed)
Immediate Anesthesia Transfer of Care Note  [name]  Procedure(s) Performed: ESOPHAGOGASTRODUODENOSCOPY (EGD) WITH PROPOFOL (N/A ) BIOPSY  Patient Location: PACU and Endoscopy Unit  Anesthesia Type:MAC  Level of Consciousness: sedated  Airway & Oxygen Therapy: Patient Spontanous Breathing and Patient connected to face mask oxygen  Post-op Assessment: Report given to RN and Post -op Vital signs reviewed and stable  Post vital signs: Reviewed and stable  Last Vitals:  Vitals Value Taken Time  BP    Temp    Pulse 88 02/18/20 1422  Resp 25 02/18/20 1422  SpO2 100 % 02/18/20 1422  Vitals shown include unvalidated device data.  Last Pain:  Vitals:   02/18/20 1335  TempSrc: Oral  PainSc: 0-No pain      Patients Stated Pain Goal: 2 (33/83/29 1916)  Complications: No complications documented.

## 2020-02-18 NOTE — Anesthesia Preprocedure Evaluation (Addendum)
Anesthesia Evaluation  Patient identified by MRN, date of birth, ID band Patient awake    Reviewed: Allergy & Precautions, NPO status , Patient's Chart, lab work & pertinent test results  Airway Mallampati: II  TM Distance: >3 FB Neck ROM: Full    Dental no notable dental hx. (+) Teeth Intact, Dental Advisory Given   Pulmonary sleep apnea , COPD, former smoker,    Pulmonary exam normal breath sounds clear to auscultation       Cardiovascular hypertension, Normal cardiovascular exam Rhythm:Regular Rate:Normal     Neuro/Psych Anxiety    GI/Hepatic GERD  Medicated,intrahepatic cholangiocarcinoma W mETS   Endo/Other  diabetes  Renal/GU      Musculoskeletal  (+) Arthritis ,   Abdominal   Peds  Hematology   Anesthesia Other Findings   Reproductive/Obstetrics                            Anesthesia Physical Anesthesia Plan  ASA: III  Anesthesia Plan: MAC   Post-op Pain Management:    Induction:   PONV Risk Score and Plan: Treatment may vary due to age or medical condition  Airway Management Planned: Nasal Cannula and Natural Airway  Additional Equipment: None  Intra-op Plan:   Post-operative Plan:   Informed Consent:     Dental advisory given  Plan Discussed with: CRNA and Anesthesiologist  Anesthesia Plan Comments: (Had long discussion with patient about propafol. Patient States" propafol has burned her veins in the past  When it was used so she tells people she is allergic so she does not receive propafol". Although she has received it in past. I told her that propafol can irritate the veins upon injection. It is a known side effect. Often we weigh that side effect with its efficacy profile.    It is the ideal medication for her current procedure as it is short acting, we are currently using her port for IV fluids so the medication should not burn upon injection. With patients  concerns addressed she agrees to proceed.)       Anesthesia Quick Evaluation

## 2020-02-18 NOTE — Progress Notes (Signed)
PROGRESS NOTE  Bonnie Peterson SHF:[id number] DOB: 09-14-1945 DOA: 02/14/2020 PCP: Lattie Haw, MD   LOS: 3 days   Brief Narrative / Interim history: 74 year old female with intrahepatic adenocarcinoma with metastatic disease, came into the ED with intractable nausea and vomiting along with abdominal pain.  This has been going on for at least 3 weeks, progressively getting worse.  She has used Reglan, scopolamine, Phenergan at home but symptoms have worsened and came to the hospital.  She has hospice services at home but not comfort care at this point.  Oncology has been evaluated patient also, and it seems like her refractory nausea and vomiting are probably secondary to gastric outlet obstruction from her metastatic cancer, she is not a candidate for chemotherapy anymore.  Palliative care also consulted.  Subjective / 24h Interval events: She is not doing well this morning, had several episodes of emesis overnight.  She is unable to keep even her medications down.  She ate total of few cups of Jell-O in the past 24 hours  Assessment & Plan: Principal Problem Intractable nausea and vomiting -Possibly gastric outlet obstruction, CT scan in October showed mural thickening of the distal stomach, predominantly involving the antrum with a somewhat masslike appearance.  Thickening extends into the pyloric region as well as the duodenal bulb, possibly sequela of prior radiation therapy versus underlying mass. Not a candidate for chemotherapy per oncology -Due to persistent and progressive symptoms she underwent a repeat CT scan of the abdomen and pelvis yesterday which showed again circumferential mural thickening and edema on the distal body of the stomach, antrum.  I have consulted gastroenterology, appreciate input.  I wonder whether she would benefit from an EGD for diagnostic purposes versus whether a palliative stent is feasible in a case like this  Active Problems Widely metastatic  cholangiocarcinoma -Not a candidate for further treatment.  Palliative consulted.  Patient and family have difficulties understanding gravity of her condition   Hypokalemia -Due to poor p.o. intake, continue to follow daily and supplement as needed  Essential hypertension -Blood pressure slightly better after the clonidine patch, continue.  She is unable to take her oral medications  Type 2 diabetes mellitus -With poor p.o. intake avoid strict CBG control  Thrombocytopenia -Possibly related to malignancy, platelets overall stable, no bleeding  Goals of care -Remains partial code, appreciate palliative follow-up  Scheduled Meds: . Chlorhexidine Gluconate Cloth  6 each Topical Daily  . cloNIDine  0.2 mg Transdermal Weekly  . metoCLOPramide (REGLAN) injection  5 mg Intravenous Q6H  . polyethylene glycol  17 g Oral Daily  . senna-docusate  2 tablet Oral BID   Continuous Infusions: . dextrose 5 % and 0.9 % NaCl with KCl 40 mEq/L 50 mL/hr at 02/18/20 0744   PRN Meds:.haloperidol, hydrALAZINE, HYDROmorphone (DILAUDID) injection, LORazepam, promethazine, sodium chloride flush  Diet Orders (From admission, onward)    Start     Ordered   02/14/20 1844  Diet clear liquid Room service appropriate? Yes; Fluid consistency: Thin  Diet effective now       Question Answer Comment  Room service appropriate? Yes   Fluid consistency: Thin      02/14/20 1843          DVT prophylaxis: SCDs Start: 02/14/20 1844     Code Status: Partial Code  Family Communication: Daughter at bedside  Status is: Inpatient  Remains inpatient appropriate because:Ongoing active pain requiring inpatient pain management, IV treatments appropriate due to intensity of illness or inability to  take PO and Inpatient level of care appropriate due to severity of illness   Dispo: The patient is from: Home              Anticipated d/c is to: Home              Anticipated d/c date is: 3 days              Patient  currently is not medically stable to d/c.   Consultants:  Palliative care   Procedures:  None   Microbiology  None   Antimicrobials: None     Objective: Vitals:   02/18/20 0004 02/18/20 0148 02/18/20 0502 02/18/20 0752  BP: (!) 189/78 (!) 183/85 (!) 141/65 (!) 182/84  Pulse: 99 (!) 114 93 95  Resp:  18 18 19   Temp:  97.9 F (36.6 C) 98.1 F (36.7 C) 98.1 F (36.7 C)  TempSrc:  Oral Oral   SpO2:  97% 96% 99%  Weight:      Height:        Intake/Output Summary (Last 24 hours) at 02/18/2020 1015 Last data filed at 02/18/2020 0900 Gross per 24 hour  Intake 118 ml  Output 1 ml  Net 117 ml   Filed Weights   02/16/20 2200  Weight: 59 kg    Examination:  Constitutional: No distress, in bed Eyes: No scleral icterus ENMT: mmm Neck: normal, supple Respiratory: Lungs are clear, no wheezing, no crackles Cardiovascular: Regular rate and rhythm, no murmurs, no edema Abdomen: Abdomen is soft, mildly tender to palpation in the epigastric area, nondistended, bowel sounds positive Musculoskeletal: no clubbing / cyanosis.  Skin: No rashes appreciated Neurologic: Nonfocal, equal strength  Data Reviewed: I have independently reviewed following labs and imaging studies   CBC: Recent Labs  Lab 02/14/20 1112 02/15/20 0417 02/16/20 0413 02/17/20 0612 02/18/20 0405  WBC 5.7 4.7 5.2 5.5 7.2  HGB 12.2 11.6* 12.1 11.3* 11.5*  HCT 39.5 38.1 38.0 36.0 36.5  MCV 94.3 94.8 93.1 94.2 92.2  PLT 136* 110* 111* 102* 175*   Basic Metabolic Panel: Recent Labs  Lab 02/14/20 1112 02/14/20 1307 02/15/20 0417 02/16/20 0413 02/17/20 0612 02/18/20 0405  NA 145  --  139 137 138 140  K 3.0*  --  3.0* 3.4* 4.3 4.0  CL 98  --  99 101 104 106  CO2 33*  --  30 28 28 26   GLUCOSE 103*  --  121* 120* 170* 165*  BUN 20  --  16 10 8  6*  CREATININE 0.73  --  0.68 0.54 0.60 0.50  CALCIUM 8.8*  --  8.3* 8.2* 8.5* 8.8*  MG  --  2.2  --  2.0 1.8  --   PHOS  --   --   --   --  2.2*  --     Liver Function Tests: Recent Labs  Lab 02/14/20 1112 02/15/20 0417 02/17/20 0612 02/18/20 0405  AST 34 33 38 31  ALT 17 17 21 21   ALKPHOS 78 76 75 77  BILITOT 1.2 0.7 1.0 0.6  PROT 6.9 6.1* 6.2* 6.3*  ALBUMIN 3.1* 2.8* 2.5* 2.5*   Coagulation Profile: No results for input(s): INR, PROTIME in the last 168 hours. HbA1C: No results for input(s): HGBA1C in the last 72 hours. CBG: Recent Labs  Lab 02/16/20 1740 02/17/20 0806 02/17/20 1122 02/17/20 1721 02/18/20 0715  GLUCAP 114* 146* 130* 144* 150*    Recent Results (from the past 240 hour(s))  Respiratory  Panel by RT PCR (Flu A&B, Covid) - Nasopharyngeal Swab     Status: None   Collection Time: 02/14/20  3:45 PM   Specimen: Nasopharyngeal Swab  Result Value Ref Range Status   SARS Coronavirus 2 by RT PCR NEGATIVE NEGATIVE Final    Comment: (NOTE) SARS-CoV-2 target nucleic acids are NOT DETECTED.  The SARS-CoV-2 RNA is generally detectable in upper respiratoy specimens during the acute phase of infection. The lowest concentration of SARS-CoV-2 viral copies this assay can detect is 131 copies/mL. A negative result does not preclude SARS-Cov-2 infection and should not be used as the sole basis for treatment or other patient management decisions. A negative result may occur with  improper specimen collection/handling, submission of specimen other than nasopharyngeal swab, presence of viral mutation(s) within the areas targeted by this assay, and inadequate number of viral copies (<131 copies/mL). A negative result must be combined with clinical observations, patient history, and epidemiological information. The expected result is Negative.  Fact Sheet for Patients:  PinkCheek.be  Fact Sheet for Healthcare Providers:  GravelBags.it  This test is no t yet approved or cleared by the [place] FDA and  has been authorized for detection and/or diagnosis of  SARS-CoV-2 by FDA under an Emergency Use Authorization (EUA). This EUA will remain  in effect (meaning this test can be used) for the duration of the COVID-19 declaration under Section 564(b)(1) of the Act, 21 U.S.C. section 360bbb-3(b)(1), unless the authorization is terminated or revoked sooner.     Influenza A by PCR NEGATIVE NEGATIVE Final   Influenza B by PCR NEGATIVE NEGATIVE Final    Comment: (NOTE) The Xpert Xpress SARS-CoV-2/FLU/RSV assay is intended as an aid in  the diagnosis of influenza from Nasopharyngeal swab specimens and  should not be used as a sole basis for treatment. Nasal washings and  aspirates are unacceptable for Xpert Xpress SARS-CoV-2/FLU/RSV  testing.  Fact Sheet for Patients: PinkCheek.be  Fact Sheet for Healthcare Providers: GravelBags.it  This test is not yet approved or cleared by the [place] FDA and  has been authorized for detection and/or diagnosis of SARS-CoV-2 by  FDA under an Emergency Use Authorization (EUA). This EUA will remain  in effect (meaning this test can be used) for the duration of the  Covid-19 declaration under Section 564(b)(1) of the Act, 21  U.S.C. section 360bbb-3(b)(1), unless the authorization is  terminated or revoked. Performed at [facility], [place] [address], [place], [place] [address]      Radiology Studies: CT CHEST W CONTRAST  Result Date: 02/17/2020 CLINICAL DATA:  Intrahepatic cholangiocarcinoma. Restaging examination. EXAM: CT CHEST, ABDOMEN, AND PELVIS WITH CONTRAST TECHNIQUE: Multidetector CT imaging of the chest, abdomen and pelvis was performed following the standard protocol during bolus administration of intravenous contrast. CONTRAST:  142m OMNIPAQUE IOHEXOL 300 MG/ML  SOLN COMPARISON:  CT abdomen pelvis 01/14/2020 and 01/11/2020, CT chest 07/29/2017 FINDINGS: CT CHEST FINDINGS Cardiovascular: Moderate multi-vessel coronary  artery calcification. Global cardiac size within normal limits. Lipomatous hypertrophy of the intra-atrial septum noted. Mild thinning of the left ventricular apex suggests remote myocardial infarction or chronic ischemia in this location. No pericardial effusion. The central pulmonary arteries are enlarged suggesting changes of pulmonary arterial hypertension. Extensive atherosclerotic calcification noted within the thoracic aorta. The thoracic aorta is mildly dilated measuring 4.0 cm in greatest dimension within the ascending segment and 3.2 cm in greatest dimension within the arch. The thoracic aorta demonstrates a more normal caliber at the level of the  left atrium measuring 2.5 cm in dimension. Right internal jugular chest port catheter tip noted within the superior cavoatrial junction. Mediastinum/Nodes: Thyroid unremarkable. No pathologic thoracic adenopathy. Mild circumferential thickening of the distal esophageal wall may reflect changes of mild esophagitis, as can be seen with reflux esophagitis. The esophagus is otherwise unremarkable. Lungs/Pleura: Mild emphysema again noted. Bibasilar atelectasis is noted. 5 mm subpleural pulmonary nodule within the right middle lobe, axial image # 90/4, is stable since remote prior examination and is safely considered benign given its stability over time. No new focal pulmonary nodule or infiltrate. No pneumothorax or pleural effusion. Central airways are widely patent. Musculoskeletal: Osseous structures are diffusely osteopenic. Advanced degenerative changes are seen within the thoracic spine. Flowing osteophytes throughout the thoracic spine are in keeping with changes of diffuse idiopathic skeletal hyperostosis. No focal lytic or blastic bone lesions are identified. CT ABDOMEN PELVIS FINDINGS Hepatobiliary: Surgical changes of left hepatectomy are again identified. Multiple enhancing masses are again seen scattered throughout the residual right hepatic lobe in  keeping with recurrent cholangiocarcinoma. Exact measurement is difficult as many of these variably enhancing masses form irregular conglomerates, however, these appear grossly stable since prior examination. By example: Conglomerate within the superior right hepatic lobe, axial image # 55/2, measures 6.1 x 6.5 cm (previously measuring 6.0 x 7.6 cm). Conglomerate within the inferior right hepatic lobe, axial image # 74/2, measures 4.5 x 7.0 cm (previously measuring 4.3 x 7.4 cm). Focal enhancing mass within the right hepatic lobe, axial image # 70/2, measures 2.6 x 3.2 cm (previously measuring 2.5 x 2.8 cm). No intrahepatic biliary ductal dilation. Status post cholecystectomy. The extrahepatic bile duct is not dilated. Thrombosis of the portal vein is again identified Pancreas: Unremarkable Spleen: Unremarkable Adrenals/Urinary Tract: Adrenal glands are unremarkable. Kidneys are normal, without renal calculi, focal lesion, or hydronephrosis. Bladder is unremarkable. Stomach/Bowel: There is circumferential mural thickening and edema involving the distal body of the stomach and antrum, similar to that noted a on prior examination, possibly reflecting changes of portal gastropathy given chronic occlusion of the portal vein. Alternatively, this can be seen in caustic or infectious gastritis. No evidence of obstruction or perforation. Multiple pedunculated lipomas are again seen within the colon measuring 14 mm within the distal transverse colon and 3.0 cm within the sigmoid colon. The small and large bowel are otherwise unremarkable. The appendix is unremarkable. Trace ascites is present, similar to prior examination. No free intraperitoneal gas. Vascular/Lymphatic: Extensive aortoiliac atherosclerotic calcification. No aortic aneurysm. Shotty, enhancing left periaortic lymphadenopathy is stable, measuring 11 mm on axial image # 69/2. No frankly pathologic adenopathy within the abdomen and pelvis. Reproductive: Status  post hysterectomy. No adnexal masses. Other: Rectum unremarkable Musculoskeletal: Advanced degenerative changes are seen within the lumbar spine. No lytic or blastic bone lesions are seen. IMPRESSION: Stable disease. Status post left hepatectomy with multiple recurrent conglomerate enhancing masses within the residual right hepatic lobe. No intrahepatic biliary ductal dilation. Malignant masses replace roughly 50% of the liver parenchyma. Chronic thrombosis of the portal vein. Stable thickening and edema of the distal body of the stomach and gastric antrum possibly representing changes of portal gastropathy. Note that caustic or infectious gastritis could appear similarly and correlation with endoscopy may be helpful for definitive evaluation. No evidence of pulmonary metastatic disease. Moderate multi-vessel coronary artery calcification. Mild dilation of the thoracic aorta. Further follow-up is dependent upon the patient's prognosis related to their underlying malignancy. This can be reassessed on subsequent surveillance imaging for the patient's  primary malignancy. Aortic Atherosclerosis (ICD10-I70.0) and Emphysema (ICD10-J43.9). Aortic aneurysm NOS (ICD10-I71.9). Electronically Signed   By: Fidela Salisbury MD   On: 02/17/2020 20:41   CT ABDOMEN PELVIS W CONTRAST  Result Date: 02/17/2020 CLINICAL DATA:  Intrahepatic cholangiocarcinoma. Restaging examination. EXAM: CT CHEST, ABDOMEN, AND PELVIS WITH CONTRAST TECHNIQUE: Multidetector CT imaging of the chest, abdomen and pelvis was performed following the standard protocol during bolus administration of intravenous contrast. CONTRAST:  135m OMNIPAQUE IOHEXOL 300 MG/ML  SOLN COMPARISON:  CT abdomen pelvis 01/14/2020 and 01/11/2020, CT chest 07/29/2017 FINDINGS: CT CHEST FINDINGS Cardiovascular: Moderate multi-vessel coronary artery calcification. Global cardiac size within normal limits. Lipomatous hypertrophy of the intra-atrial septum noted. Mild thinning of  the left ventricular apex suggests remote myocardial infarction or chronic ischemia in this location. No pericardial effusion. The central pulmonary arteries are enlarged suggesting changes of pulmonary arterial hypertension. Extensive atherosclerotic calcification noted within the thoracic aorta. The thoracic aorta is mildly dilated measuring 4.0 cm in greatest dimension within the ascending segment and 3.2 cm in greatest dimension within the arch. The thoracic aorta demonstrates a more normal caliber at the level of the left atrium measuring 2.5 cm in dimension. Right internal jugular chest port catheter tip noted within the superior cavoatrial junction. Mediastinum/Nodes: Thyroid unremarkable. No pathologic thoracic adenopathy. Mild circumferential thickening of the distal esophageal wall may reflect changes of mild esophagitis, as can be seen with reflux esophagitis. The esophagus is otherwise unremarkable. Lungs/Pleura: Mild emphysema again noted. Bibasilar atelectasis is noted. 5 mm subpleural pulmonary nodule within the right middle lobe, axial image # 90/4, is stable since remote prior examination and is safely considered benign given its stability over time. No new focal pulmonary nodule or infiltrate. No pneumothorax or pleural effusion. Central airways are widely patent. Musculoskeletal: Osseous structures are diffusely osteopenic. Advanced degenerative changes are seen within the thoracic spine. Flowing osteophytes throughout the thoracic spine are in keeping with changes of diffuse idiopathic skeletal hyperostosis. No focal lytic or blastic bone lesions are identified. CT ABDOMEN PELVIS FINDINGS Hepatobiliary: Surgical changes of left hepatectomy are again identified. Multiple enhancing masses are again seen scattered throughout the residual right hepatic lobe in keeping with recurrent cholangiocarcinoma. Exact measurement is difficult as many of these variably enhancing masses form irregular  conglomerates, however, these appear grossly stable since prior examination. By example: Conglomerate within the superior right hepatic lobe, axial image # 55/2, measures 6.1 x 6.5 cm (previously measuring 6.0 x 7.6 cm). Conglomerate within the inferior right hepatic lobe, axial image # 74/2, measures 4.5 x 7.0 cm (previously measuring 4.3 x 7.4 cm). Focal enhancing mass within the right hepatic lobe, axial image # 70/2, measures 2.6 x 3.2 cm (previously measuring 2.5 x 2.8 cm). No intrahepatic biliary ductal dilation. Status post cholecystectomy. The extrahepatic bile duct is not dilated. Thrombosis of the portal vein is again identified Pancreas: Unremarkable Spleen: Unremarkable Adrenals/Urinary Tract: Adrenal glands are unremarkable. Kidneys are normal, without renal calculi, focal lesion, or hydronephrosis. Bladder is unremarkable. Stomach/Bowel: There is circumferential mural thickening and edema involving the distal body of the stomach and antrum, similar to that noted a on prior examination, possibly reflecting changes of portal gastropathy given chronic occlusion of the portal vein. Alternatively, this can be seen in caustic or infectious gastritis. No evidence of obstruction or perforation. Multiple pedunculated lipomas are again seen within the colon measuring 14 mm within the distal transverse colon and 3.0 cm within the sigmoid colon. The small and large bowel are otherwise unremarkable.  The appendix is unremarkable. Trace ascites is present, similar to prior examination. No free intraperitoneal gas. Vascular/Lymphatic: Extensive aortoiliac atherosclerotic calcification. No aortic aneurysm. Shotty, enhancing left periaortic lymphadenopathy is stable, measuring 11 mm on axial image # 69/2. No frankly pathologic adenopathy within the abdomen and pelvis. Reproductive: Status post hysterectomy. No adnexal masses. Other: Rectum unremarkable Musculoskeletal: Advanced degenerative changes are seen within the  lumbar spine. No lytic or blastic bone lesions are seen. IMPRESSION: Stable disease. Status post left hepatectomy with multiple recurrent conglomerate enhancing masses within the residual right hepatic lobe. No intrahepatic biliary ductal dilation. Malignant masses replace roughly 50% of the liver parenchyma. Chronic thrombosis of the portal vein. Stable thickening and edema of the distal body of the stomach and gastric antrum possibly representing changes of portal gastropathy. Note that caustic or infectious gastritis could appear similarly and correlation with endoscopy may be helpful for definitive evaluation. No evidence of pulmonary metastatic disease. Moderate multi-vessel coronary artery calcification. Mild dilation of the thoracic aorta. Further follow-up is dependent upon the patient's prognosis related to their underlying malignancy. This can be reassessed on subsequent surveillance imaging for the patient's primary malignancy. Aortic Atherosclerosis (ICD10-I70.0) and Emphysema (ICD10-J43.9). Aortic aneurysm NOS (ICD10-I71.9). Electronically Signed   By: Fidela Salisbury MD   On: 02/17/2020 20:41    Marzetta Board, MD, PhD [facility]  Between 7 am - 7 pm I am available, please contact me via Amion or Securechat  Between 7 pm - 7 am I am not available, please contact night coverage MD/APP via Amion

## 2020-02-18 NOTE — Anesthesia Procedure Notes (Signed)
Procedure Name: MAC Date/Time: 02/18/2020 1:55 PM Performed by: Cynda Familia, CRNA Pre-anesthesia Checklist: Patient identified, Emergency Drugs available, Suction available, Patient being monitored and Timeout performed Patient Re-evaluated:Patient Re-evaluated prior to induction Oxygen Delivery Method: Simple face mask Placement Confirmation: positive ETCO2 and breath sounds checked- equal and bilateral Dental Injury: Teeth and Oropharynx as per pre-operative assessment

## 2020-02-18 NOTE — Consult Note (Signed)
Consultation  Referring Provider: Dr. Cruzita Lederer Primary Care Physician:  Lattie Haw, MD Primary Gastroenterologist: Dr. [name]       Reason for Consultation: Nausea and vomiting            HPI:   Bonnie Peterson is a 74 y.o. female with a past medical history significant for intrahepatic cholangiocarcinoma with mets, who presented to the ER on 02/14/2020 with intractable nausea and vomiting.    At time of presentation family reported the patient had 3 weeks of nausea and vomiting that had not been controlled on her home meds of Reglan, scopolamine and Phenergan.  She had been unable to keep any food or liquids down.  Patient is on home hospice but not on comfort care.    Today, patient is seen with her daughter by her bedside.  They tell me that she is really not been able to keep in anything other than small sips which even does sometimes come out within minutes after she swallows over the past 3 weeks.  Her daughter is tearful and tells me that she still has "life left", they want [place] to take a further look to see what is going on and try and help her.  Patient does have some epigastric discomfort.  Apparently still having daily bowel movements.    Denies fever or chills.  GI history: 08/12/2019 colonoscopy done for rectal bleeding and history of polyps: Findings: - There were several lipomas spread throughout the colon. The largest was in the sigmoid (pedunculated 2-3cm, slightly inflamed at the tip). This was unchanged from 2015, the previous submucosal tattoo was still evident. - Internal hemorrhoids (this is likely the cause of your minor rectal bleeding). - Diverticulosis in the left colon. - The examination was otherwise normal on direct and retroflexion views. - No polyps or cancers.   Past Medical History:  Diagnosis Date  . Anxiety   . Benign positional vertigo   . Cancer ([place]) 02/09/15   intrahepatic cholangiocarcinoma  . COPD (chronic obstructive pulmonary disease)  ([place])   . Depression   . Diabetes mellitus without complication ([place])   . Early cataracts, bilateral 10/13   Optho, Dr Gershon Crane  . Echocardiogram findings abnormal, without diagnosis 10/10   10/10: mild pulm HTN, EF 60-65%, mild LVH, moderate aortic regurg  . GERD (gastroesophageal reflux disease)    I have "acid reflux"  . Gout   . HLD (hyperlipidemia)   . HTN, goal below 130/80   . Irritable bowel syndrome   . Obesity, Class III, BMI 40-49.9 (morbid obesity) ([place])   . Obstructive sleep apnea    wears cpap  . Osteoarthritis (arthritis due to wear and tear of joints)    also gout  . Restrictive lung disease     Past Surgical History:  Procedure Laterality Date  . ABDOMINAL HYSTERECTOMY    . COLONOSCOPY WITH PROPOFOL N/A 08/12/2019   Procedure: COLONOSCOPY WITH PROPOFOL;  Surgeon: [name], MD;  Location: [facility];  Service: Endoscopy;  Laterality: N/A;  . ESOPHAGOGASTRODUODENOSCOPY N/A 06/15/2015   Procedure: ESOPHAGOGASTRODUODENOSCOPY (EGD);  Surgeon: Gatha Mayer, MD;  Location: [facility];  Service: Endoscopy;  Laterality: N/A;  . IR IMAGING GUIDED PORT INSERTION  06/15/2019  . IR REMOVAL TUN ACCESS W/ PORT W/O FL MOD SED  11/11/2016  . LIVER BIOPSY    . OPEN PARTIAL HEPATECTOMY  Left 02/09/15  . PARTIAL HYSTERECTOMY    . ROTATOR CUFF REPAIR  L rotator cuff repair 11/07-[place] - 12/7/200  . TONSILLECTOMY      Family History  Problem Relation Age of Onset  . Breast cancer Mother   . Hypertension Mother   . Coronary artery disease Mother   . Diabetes type II Mother   . Rheum arthritis Mother   . Diabetes type II Sister   . Pancreatic cancer Sister     Social History   Tobacco Use  . Smoking status: Former Smoker    Packs/day: 0.50    Years: 49.00    Pack years: 24.50    Types: Cigarettes    Quit date: 04/13/2018    Years since quitting: 1.8  . Smokeless tobacco: Never Used  Vaping Use  . Vaping Use: Never used  Substance Use Topics  . Alcohol  use: Yes    Alcohol/week: 0.0 standard drinks    Comment: occasionally  . Drug use: Yes    Types: Marijuana    Prior to Admission medications   Medication Sig Start Date End Date Taking? Authorizing Provider  albuterol (PROAIR HFA) 108 (90 Base) MCG/ACT inhaler Inhale 1-2 puffs into the lungs every 6 (six) hours as needed for wheezing or shortness of breath. 07/23/19  Yes Parrett, Tammy S, NP  alendronate (FOSAMAX) 70 MG tablet TAKE 1 TABLET  ONCE A WEEK. Patient taking differently: Take 70 mg by mouth every Sunday. TAKE 1 TABLET  ONCE A WEEK. 03/02/19  Yes Lattie Haw, MD  amLODipine (NORVASC) 10 MG tablet TAKE 1 TABLET EVERY DAY Patient taking differently: Take 10 mg by mouth daily.  06/03/19  Yes Lattie Haw, MD  cetirizine (ZYRTEC) 10 MG tablet Take 10 mg by mouth daily as needed for allergies.   Yes [provider]  cholecalciferol (VITAMIN D) 1000 units tablet Take 1 tablet (1,000 Units total) by mouth daily. 01/03/17  Yes Bufford Lope, DO  dicyclomine (BENTYL) 20 MG tablet Take 1 tablet (20 mg total) by mouth 2 (two) times daily as needed for spasms (abdominal pain). 01/11/20  Yes Gareth Morgan, MD  famotidine (PEPCID) 20 MG tablet Take 1 tablet (20 mg total) by mouth 2 (two) times daily. Patient taking differently: Take 20 mg by mouth 2 (two) times daily as needed for heartburn.  06/26/19  Yes Lattie Haw, MD  fish oil-omega-3 fatty acids 1000 MG capsule Take 1 g by mouth once a week.    Yes [provider]  fluticasone (FLONASE) 50 MCG/ACT nasal spray Place 2 sprays into both nostrils daily as needed for rhinitis. 04/17/16  Yes Haney, Alyssa A, MD  haloperidol (HALDOL) 0.5 MG tablet Take 1 tablet (0.5 mg total) by mouth every 6 (six) hours as needed for agitation (or nausea). 01/15/20  Yes Micheline Rough, MD  Hyprom-Naphaz-Polysorb-Zn Sulf (CLEAR EYES COMPLETE OP) Place 1 drop into both eyes daily as needed (dry eyes).    Yes [provider]    lidocaine-prilocaine (EMLA) cream Apply 1 application topically every 14 (fourteen) days. Every 14 days as needed Patient taking differently: Apply 1 application topically as needed (port access).  06/17/19  Yes Truitt Merle, MD  lisinopril (ZESTRIL) 40 MG tablet Take 1 tablet (40 mg total) by mouth daily. 03/02/19  Yes Lattie Haw, MD  LORazepam (ATIVAN) 1 MG tablet Take 1 mg by mouth daily as needed for anxiety.  01/26/20  Yes [provider]  metoCLOPramide (REGLAN) 10 MG tablet Take 10 mg by mouth 4 (four) times daily. 01/25/20  Yes [provider]  mirtazapine (REMERON SOL-TAB) 15 MG disintegrating tablet Take 1 tablet (15 mg total) by mouth at bedtime. 01/06/20 01/05/21 Yes Truitt Merle, MD  Multiple Vitamin (MULTIVITAMIN WITH MINERALS) TABS tablet Take 1 tablet by mouth daily.   Yes [provider]  ondansetron (ZOFRAN ODT) 8 MG disintegrating tablet Take 1 tablet (8 mg total) by mouth every 8 (eight) hours as needed for nausea or vomiting. 01/12/20  Yes Tanner, Lyndon Code., PA-C  Oxycodone HCl 10 MG TABS Take 10 mg by mouth 4 (four) times daily as needed (pain).  01/26/20  Yes [provider]  Probiotic Product (PROBIOTIC PO) Take 1 capsule by mouth daily.   Yes [provider]  promethazine (PHENERGAN) 25 MG tablet Take 25 mg by mouth every 6 (six) hours as needed for nausea or vomiting.  02/03/20  Yes [provider]  scopolamine (TRANSDERM-SCOP) 1 MG/3DAYS Place 1 patch onto the skin every 3 (three) days.  02/06/20  Yes [provider]  VITAMIN E PO Take 1 tablet by mouth See admin instructions. 2-3 times a month   Yes [provider]  atenolol (TENORMIN) 25 MG tablet Take 1 tablet (25 mg total) by mouth daily. Patient not taking: Reported on 02/14/2020 01/08/19   Lattie Haw, MD  diclofenac sodium (VOLTAREN) 1 % GEL Apply 2 g topically 4 (four) times daily as needed. Patient not taking: Reported on 02/14/2020 01/26/18   Bufford Lope, DO  hydrochlorothiazide (HYDRODIURIL) 25 MG tablet Take 1 tablet (25 mg total) by mouth daily. Patient not taking: Reported on 02/14/2020 03/02/19   Lattie Haw, MD  loratadine (CLARITIN) 10 MG tablet Take 1 tablet (10 mg total) by mouth daily as needed for allergies. Patient not taking: Reported on 02/14/2020 09/23/12   Waldemar Dickens, MD  LORazepam (ATIVAN) 0.5 MG tablet Take 1 tablet (0.5 mg total) by mouth every 4 (four) hours as needed for anxiety (or nausea that is not controlled by other medications). Patient not taking: Reported on 02/14/2020 01/15/20   Micheline Rough, MD  metFORMIN (GLUCOPHAGE) 500 MG tablet TAKE 2 TABLETS TWICE DAILY WITH MEALS Patient not taking: Reported on 02/14/2020 03/02/19   Lattie Haw, MD  metoCLOPramide (REGLAN) 5 MG tablet Take 1 tablet (5 mg total) by mouth 4 (four) times daily -  before meals and at bedtime. Patient not taking: Reported on 02/14/2020 01/15/20   Micheline Rough, MD  Na Sulfate-K Sulfate-Mg Sulf (SUPREP BOWEL PREP KIT) 17.5-3.13-1.6 GM/177ML SOLN Take 1 kit by mouth as directed. Patient not taking: Reported on 01/15/2020 07/21/19   [name], MD  oxyCODONE (OXY IR/ROXICODONE) 5 MG immediate release tablet Take 0.5-1 tablets (2.5-5 mg total) by mouth every 8 (eight) hours as needed for severe pain. Patient not taking: Reported on 02/14/2020 01/06/20   Truitt Merle, MD  potassium chloride (KLOR-CON) 10 MEQ tablet Take 1 tablet (10 mEq total) by mouth daily. Patient not taking: Reported on 02/14/2020 01/10/20   Truitt Merle, MD  RIVAROXABAN Alveda Reasons) VTE STARTER PACK (15 & 20 MG TABLETS) Follow package directions: Take one 42m tablet by mouth twice a day. On day 22, switch to one 228mtablet once a day. Take with food. Patient not taking: Reported on 02/14/2020 08/26/19   FeTruitt MerleMD  traMADol (ULTRAM) 50 MG tablet Take 1 tablet (50 mg total) by mouth every 6 (six) hours as needed. Patient not taking: Reported on 01/15/2020 12/20/19   FeTruitt MerleMD     Current Facility-Administered Medications  Medication  Dose Route Frequency Provider Last Rate Last Admin  . Chlorhexidine Gluconate Cloth 2 % PADS 6 each  6 each Topical Daily Kyle, Tyrone A, DO   6 each at 02/18/20 1056  . cloNIDine (CATAPRES - Dosed in mg/24 hr) patch 0.2 mg  0.2 mg Transdermal Weekly Caren Griffins, MD   0.2 mg at 02/17/20 1802  . dextrose 5 % and 0.9 % NaCl with KCl 40 mEq/L infusion   Intravenous Continuous Cherene Altes, MD 50 mL/hr at 02/18/20 0744 New Bag at 02/18/20 0744  . haloperidol (HALDOL) tablet 0.5 mg  0.5 mg Oral Q6H PRN Joette Catching T, MD      . hydrALAZINE (APRESOLINE) injection 10 mg  10 mg Intravenous Q4H PRN Caren Griffins, MD   10 mg at 02/18/20 1036  . HYDROmorphone (DILAUDID) injection 0.5 mg  0.5 mg Intravenous Q2H PRN Caren Griffins, MD   0.5 mg at 02/18/20 1036  . LORazepam (ATIVAN) injection 0.5 mg  0.5 mg Intravenous Q4H PRN Lang Snow, NP   0.5 mg at 02/17/20 0535  . metoCLOPramide (REGLAN) injection 5 mg  5 mg Intravenous Q6H Cherene Altes, MD   5 mg at 02/18/20 0543  . polyethylene glycol (MIRALAX / GLYCOLAX) packet 17 g  17 g Oral Daily Caren Griffins, MD   17 g at 02/17/20 1058  . promethazine (PHENERGAN) injection 6.25 mg  6.25 mg Intravenous Q4H PRN Caren Griffins, MD   6.25 mg at 02/18/20 1037  . senna-docusate (Senokot-S) tablet 2 tablet  2 tablet Oral BID Caren Griffins, MD   2 tablet at 02/17/20 2213  . sodium chloride flush (NS) 0.9 % injection 10-40 mL  10-40 mL Intracatheter PRN Caren Griffins, MD        Allergies as of 02/14/2020 - Review Complete 02/14/2020  Allergen Reaction Noted  . Other  09/29/2014  . Propofol Other (See Comments) 09/30/2019     Review of Systems:    Constitutional: No fever or chills Skin: No rash Cardiovascular: No chest pain Respiratory: No SOB  Gastrointestinal: See HPI and otherwise negative Genitourinary: No dysuria  Neurological: No headache,  dizziness or syncope Musculoskeletal: No new muscle or joint pain Hematologic: No bleeding  Psychiatric: No history of depression or anxiety    Physical Exam:  Vital signs in last 24 hours: Temp:  [97.9 F (36.6 C)-99.6 F (37.6 C)] 98.7 F (37.1 C) (11/12 1035) Pulse Rate:  [82-114] 91 (11/12 1035) Resp:  [14-19] 18 (11/12 1035) BP: (141-209)/(65-89) 208/87 (11/12 1035) SpO2:  [96 %-100 %] 99 % (11/12 1035) Last BM Date: 02/17/20 General:   Pleasant ill-appearing, African-American female appears to be in NAD, Well developed, Well nourished, alert and cooperative Head:  Normocephalic and atraumatic. Eyes:   PEERL, EOMI. No icterus. Conjunctiva pink. Ears:  Normal auditory acuity. Neck:  Supple Throat: Oral cavity and pharynx without inflammation, swelling or lesion.  Lungs: Respirations even and unlabored. Lungs clear to auscultation bilaterally.   No wheezes, crackles, or rhonchi.  Heart: Normal S1, S2. No MRG. Regular rate and rhythm. No peripheral edema, cyanosis or pallor.  Abdomen:  Soft, nondistended, mild epigastric TTP. No rebound or guarding. Normal bowel sounds. No appreciable masses or hepatomegaly. Rectal:  Not performed.  Msk:  Symmetrical without gross deformities. Peripheral pulses intact.  Extremities:  Without edema, no deformity or joint abnormality.  Neurologic:  Alert and  oriented x4;  grossly normal neurologically.   Skin:  Dry and intact without significant lesions or rashes. Psychiatric: Demonstrates good judgement and reason without abnormal affect or behaviors.   LAB RESULTS: Recent Labs    02/16/20 0413 02/17/20 0612 02/18/20 0405  WBC 5.2 5.5 7.2  HGB 12.1 11.3* 11.5*  HCT 38.0 36.0 36.5  PLT 111* 102* 114*   BMET Recent Labs    02/16/20 0413 02/17/20 0612 02/18/20 0405  NA 137 138 140  K 3.4* 4.3 4.0  CL 101 104 106  CO2 _0 GLUCOSE 120* 170* 165*  BUN 10 8 6*  CREATININE 0.54 0.60 0.50  CALCIUM 8.2* 8.5* 8.8*    LFT Recent Labs    02/18/20 0405  PROT 6.3*  ALBUMIN 2.5*  AST 31  ALT 21  ALKPHOS 77  BILITOT 0.6   STUDIES: CT CHEST W CONTRAST  Result Date: 02/17/2020 CLINICAL DATA:  Intrahepatic cholangiocarcinoma. Restaging examination. EXAM: CT CHEST, ABDOMEN, AND PELVIS WITH CONTRAST TECHNIQUE: Multidetector CT imaging of the chest, abdomen and pelvis was performed following the standard protocol during bolus administration of intravenous contrast. CONTRAST:  166m OMNIPAQUE IOHEXOL 300 MG/ML  SOLN COMPARISON:  CT abdomen pelvis 01/14/2020 and 01/11/2020, CT chest 07/29/2017 FINDINGS: CT CHEST FINDINGS Cardiovascular: Moderate multi-vessel coronary artery calcification. Global cardiac size within normal limits. Lipomatous hypertrophy of the intra-atrial septum noted. Mild thinning of the left ventricular apex suggests remote myocardial infarction or chronic ischemia in this location. No pericardial effusion. The central pulmonary arteries are enlarged suggesting changes of pulmonary arterial hypertension. Extensive atherosclerotic calcification noted within the thoracic aorta. The thoracic aorta is mildly dilated measuring 4.0 cm in greatest dimension within the ascending segment and 3.2 cm in greatest dimension within the arch. The thoracic aorta demonstrates a more normal caliber at the level of the left atrium measuring 2.5 cm in dimension. Right internal jugular chest port catheter tip noted within the superior cavoatrial junction. Mediastinum/Nodes: Thyroid unremarkable. No pathologic thoracic adenopathy. Mild circumferential thickening of the distal esophageal wall may reflect changes of mild esophagitis, as can be seen with reflux esophagitis. The esophagus is otherwise unremarkable. Lungs/Pleura: Mild emphysema again noted. Bibasilar atelectasis is noted. 5 mm subpleural pulmonary nodule within the right middle lobe, axial image # 90/4, is stable since remote prior examination and is safely  considered benign given its stability over time. No new focal pulmonary nodule or infiltrate. No pneumothorax or pleural effusion. Central airways are widely patent. Musculoskeletal: Osseous structures are diffusely osteopenic. Advanced degenerative changes are seen within the thoracic spine. Flowing osteophytes throughout the thoracic spine are in keeping with changes of diffuse idiopathic skeletal hyperostosis. No focal lytic or blastic bone lesions are identified. CT ABDOMEN PELVIS FINDINGS Hepatobiliary: Surgical changes of left hepatectomy are again identified. Multiple enhancing masses are again seen scattered throughout the residual right hepatic lobe in keeping with recurrent cholangiocarcinoma. Exact measurement is difficult as many of these variably enhancing masses form irregular conglomerates, however, these appear grossly stable since prior examination. By example: Conglomerate within the superior right hepatic lobe, axial image # 55/2, measures 6.1 x 6.5 cm (previously measuring 6.0 x 7.6 cm). Conglomerate within the inferior right hepatic lobe, axial image # 74/2, measures 4.5 x 7.0 cm (previously measuring 4.3 x 7.4 cm). Focal enhancing mass within the right hepatic lobe, axial image # 70/2, measures 2.6 x 3.2 cm (previously measuring 2.5 x 2.8 cm). No intrahepatic biliary ductal dilation. Status post cholecystectomy. The extrahepatic bile duct is not dilated. Thrombosis of the portal vein is again  identified Pancreas: Unremarkable Spleen: Unremarkable Adrenals/Urinary Tract: Adrenal glands are unremarkable. Kidneys are normal, without renal calculi, focal lesion, or hydronephrosis. Bladder is unremarkable. Stomach/Bowel: There is circumferential mural thickening and edema involving the distal body of the stomach and antrum, similar to that noted a on prior examination, possibly reflecting changes of portal gastropathy given chronic occlusion of the portal vein. Alternatively, this can be seen in  caustic or infectious gastritis. No evidence of obstruction or perforation. Multiple pedunculated lipomas are again seen within the colon measuring 14 mm within the distal transverse colon and 3.0 cm within the sigmoid colon. The small and large bowel are otherwise unremarkable. The appendix is unremarkable. Trace ascites is present, similar to prior examination. No free intraperitoneal gas. Vascular/Lymphatic: Extensive aortoiliac atherosclerotic calcification. No aortic aneurysm. Shotty, enhancing left periaortic lymphadenopathy is stable, measuring 11 mm on axial image # 69/2. No frankly pathologic adenopathy within the abdomen and pelvis. Reproductive: Status post hysterectomy. No adnexal masses. Other: Rectum unremarkable Musculoskeletal: Advanced degenerative changes are seen within the lumbar spine. No lytic or blastic bone lesions are seen. IMPRESSION: Stable disease. Status post left hepatectomy with multiple recurrent conglomerate enhancing masses within the residual right hepatic lobe. No intrahepatic biliary ductal dilation. Malignant masses replace roughly 50% of the liver parenchyma. Chronic thrombosis of the portal vein. Stable thickening and edema of the distal body of the stomach and gastric antrum possibly representing changes of portal gastropathy. Note that caustic or infectious gastritis could appear similarly and correlation with endoscopy may be helpful for definitive evaluation. No evidence of pulmonary metastatic disease. Moderate multi-vessel coronary artery calcification. Mild dilation of the thoracic aorta. Further follow-up is dependent upon the patient's prognosis related to their underlying malignancy. This can be reassessed on subsequent surveillance imaging for the patient's primary malignancy. Aortic Atherosclerosis (ICD10-I70.0) and Emphysema (ICD10-J43.9). Aortic aneurysm NOS (ICD10-I71.9). Electronically Signed   By: Fidela Salisbury MD   On: 02/17/2020 20:41   CT ABDOMEN PELVIS  W CONTRAST  Result Date: 02/17/2020 CLINICAL DATA:  Intrahepatic cholangiocarcinoma. Restaging examination. EXAM: CT CHEST, ABDOMEN, AND PELVIS WITH CONTRAST TECHNIQUE: Multidetector CT imaging of the chest, abdomen and pelvis was performed following the standard protocol during bolus administration of intravenous contrast. CONTRAST:  162m OMNIPAQUE IOHEXOL 300 MG/ML  SOLN COMPARISON:  CT abdomen pelvis 01/14/2020 and 01/11/2020, CT chest 07/29/2017 FINDINGS: CT CHEST FINDINGS Cardiovascular: Moderate multi-vessel coronary artery calcification. Global cardiac size within normal limits. Lipomatous hypertrophy of the intra-atrial septum noted. Mild thinning of the left ventricular apex suggests remote myocardial infarction or chronic ischemia in this location. No pericardial effusion. The central pulmonary arteries are enlarged suggesting changes of pulmonary arterial hypertension. Extensive atherosclerotic calcification noted within the thoracic aorta. The thoracic aorta is mildly dilated measuring 4.0 cm in greatest dimension within the ascending segment and 3.2 cm in greatest dimension within the arch. The thoracic aorta demonstrates a more normal caliber at the level of the left atrium measuring 2.5 cm in dimension. Right internal jugular chest port catheter tip noted within the superior cavoatrial junction. Mediastinum/Nodes: Thyroid unremarkable. No pathologic thoracic adenopathy. Mild circumferential thickening of the distal esophageal wall may reflect changes of mild esophagitis, as can be seen with reflux esophagitis. The esophagus is otherwise unremarkable. Lungs/Pleura: Mild emphysema again noted. Bibasilar atelectasis is noted. 5 mm subpleural pulmonary nodule within the right middle lobe, axial image # 90/4, is stable since remote prior examination and is safely considered benign given its stability over time. No new focal pulmonary nodule or  infiltrate. No pneumothorax or pleural effusion. Central  airways are widely patent. Musculoskeletal: Osseous structures are diffusely osteopenic. Advanced degenerative changes are seen within the thoracic spine. Flowing osteophytes throughout the thoracic spine are in keeping with changes of diffuse idiopathic skeletal hyperostosis. No focal lytic or blastic bone lesions are identified. CT ABDOMEN PELVIS FINDINGS Hepatobiliary: Surgical changes of left hepatectomy are again identified. Multiple enhancing masses are again seen scattered throughout the residual right hepatic lobe in keeping with recurrent cholangiocarcinoma. Exact measurement is difficult as many of these variably enhancing masses form irregular conglomerates, however, these appear grossly stable since prior examination. By example: Conglomerate within the superior right hepatic lobe, axial image # 55/2, measures 6.1 x 6.5 cm (previously measuring 6.0 x 7.6 cm). Conglomerate within the inferior right hepatic lobe, axial image # 74/2, measures 4.5 x 7.0 cm (previously measuring 4.3 x 7.4 cm). Focal enhancing mass within the right hepatic lobe, axial image # 70/2, measures 2.6 x 3.2 cm (previously measuring 2.5 x 2.8 cm). No intrahepatic biliary ductal dilation. Status post cholecystectomy. The extrahepatic bile duct is not dilated. Thrombosis of the portal vein is again identified Pancreas: Unremarkable Spleen: Unremarkable Adrenals/Urinary Tract: Adrenal glands are unremarkable. Kidneys are normal, without renal calculi, focal lesion, or hydronephrosis. Bladder is unremarkable. Stomach/Bowel: There is circumferential mural thickening and edema involving the distal body of the stomach and antrum, similar to that noted a on prior examination, possibly reflecting changes of portal gastropathy given chronic occlusion of the portal vein. Alternatively, this can be seen in caustic or infectious gastritis. No evidence of obstruction or perforation. Multiple pedunculated lipomas are again seen within the colon  measuring 14 mm within the distal transverse colon and 3.0 cm within the sigmoid colon. The small and large bowel are otherwise unremarkable. The appendix is unremarkable. Trace ascites is present, similar to prior examination. No free intraperitoneal gas. Vascular/Lymphatic: Extensive aortoiliac atherosclerotic calcification. No aortic aneurysm. Shotty, enhancing left periaortic lymphadenopathy is stable, measuring 11 mm on axial image # 69/2. No frankly pathologic adenopathy within the abdomen and pelvis. Reproductive: Status post hysterectomy. No adnexal masses. Other: Rectum unremarkable Musculoskeletal: Advanced degenerative changes are seen within the lumbar spine. No lytic or blastic bone lesions are seen. IMPRESSION: Stable disease. Status post left hepatectomy with multiple recurrent conglomerate enhancing masses within the residual right hepatic lobe. No intrahepatic biliary ductal dilation. Malignant masses replace roughly 50% of the liver parenchyma. Chronic thrombosis of the portal vein. Stable thickening and edema of the distal body of the stomach and gastric antrum possibly representing changes of portal gastropathy. Note that caustic or infectious gastritis could appear similarly and correlation with endoscopy may be helpful for definitive evaluation. No evidence of pulmonary metastatic disease. Moderate multi-vessel coronary artery calcification. Mild dilation of the thoracic aorta. Further follow-up is dependent upon the patient's prognosis related to their underlying malignancy. This can be reassessed on subsequent surveillance imaging for the patient's primary malignancy. Aortic Atherosclerosis (ICD10-I70.0) and Emphysema (ICD10-J43.9). Aortic aneurysm NOS (ICD10-I71.9). Electronically Signed   By: Fidela Salisbury MD   On: 02/17/2020 20:41     Impression / Plan:   Impression: 1.  Intractable nausea and vomiting: For the past 3 weeks for patient and family, regardless of home medications;  consider obstruction versus neoplastic process versus other 2.  Thrombocytopenia: Platelets 114 3.  Intrahepatic cholangiocarcinoma with mets  Plan: 1.  Discussed with daughter at bedside.  She wants [place] to do a further evaluation. 2.  Scheduled EGD with Dr.  Beavers this afternoon at 215.  Did discuss risks, benefits, limitations and alternatives and patient agrees to proceed. 3.  Patient will be strict n.p.o. until after time of procedure 4.  Please await any further recommendations from Dr. Tarri Glenn after time of procedure this afternoon.  Thank you for your kind consultation, we will continue to follow.  Lavone Nian Eiman Maret  02/18/2020, 11:18 AM

## 2020-02-18 NOTE — Op Note (Signed)
[facility] Patient Name: Bonnie Peterson Procedure Date: 02/18/2020 MRN: [id number] Attending MD: Thornton Park MD, MD Date of Birth: 1946/01/07 CSN: [id number] Age: 74 Admit Type: Inpatient Procedure:                Upper GI endoscopy Indications:              Nausea with vomiting Providers:                Thornton Park MD, MD, Erenest Rasher, RN, Ladona Ridgel, Technician, Lesia Sago, Technician,                            Glenis Smoker, CRNA Referring MD:              Medicines:                Monitored Anesthesia Care Complications:            No immediate complications. Estimated blood loss:                            Minimal. Estimated Blood Loss:     Estimated blood loss was minimal. Procedure:                Pre-Anesthesia Assessment:                           - Prior to the procedure, a History and Physical                            was performed, and patient medications and                            allergies were reviewed. The patient's tolerance of                            previous anesthesia was also reviewed. The risks                            and benefits of the procedure and the sedation                            options and risks were discussed with the patient.                            All questions were answered, and informed consent                            was obtained. Prior Anticoagulants: The patient has                            taken no previous anticoagulant or antiplatelet                            agents. ASA Grade Assessment:  III - A patient with                            severe systemic disease. After reviewing the risks                            and benefits, the patient was deemed in                            satisfactory condition to undergo the procedure.                           After obtaining informed consent, the endoscope was                            passed under direct  vision. Throughout the                            procedure, the patient's blood pressure, pulse, and                            oxygen saturations were monitored continuously. The                            GIF-H190 ([id number]) Olympus gastroscope was                            introduced through the mouth, and advanced to the                            second part of duodenum. The upper GI endoscopy was                            performed with difficulty due to abnormal anatomy.                            The patient tolerated the procedure well. Scope In: Scope Out: Findings:      LA Grade D (one or more mucosal breaks involving at least 75% of       esophageal circumference) esophagitis with no bleeding was found from 28       to 34 cm from the incisors. Biopsies were taken with a cold forceps for       histology. Estimated blood loss was minimal.      Diffuse mildly erythematous mucosa without bleeding was found in the       gastric fundus, in the gastric body and in the gastric antrum. In the       antrum, the mucosa has a verrucous appearance. Biopsies were taken with       a cold forceps for histology. Estimated blood loss was minimal.      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       The gastroscope can advance through the pylorus without resistance, but,       the path is circuitous.      The examined duodenum was normal.  Impression:               - LA Grade D reflux esophagitis with no bleeding.                            Biopsied to exclude infection. This may be a result                            of her ongoing vomiting as opposed to the cause of                            symptoms.                           - Erythematous mucosa in the gastric fundus,                            gastric body and antrum. Biopsied for H pylori.                           - Deformity in the prepyloric region of the stomach                            that likely  causes delayed gastric emptying but not                            complete gastric obstruction.                           - Given these findings, management of her symptoms                            is challenging. No endoscopic interventions                            available to minimize her symptoms. Moderate Sedation:      Not Applicable - Patient had care per Anesthesia. Recommendation:           - Return patient to hospital ward for ongoing care.                           - Clear liquid diet. Could consider trial of a very                            low residue diet.                           - Continue present medications. IV PPI BID. Add                            Carafate slurry QID.                           - Keep the head of the bed elevated as much as  possible.                           - Await pathology results to exclude concurrent                            infectious processes.                           - Anatomy may limit candidacy for IR placed GJ                            tube. Could consider J tube - although this may not                            control her nausea or vomiting and it seems her                            highest priority is eating.                           Discussed the findings and recommendations with the                            patient in the endoscopy recovery room, the                            patient's daughter by phone, and with Dr. Cruzita Lederer. Procedure Code(s):        --- Professional ---                           [id number], Esophagogastroduodenoscopy, flexible,                            transoral; with biopsy, single or multiple Diagnosis Code(s):        --- Professional ---                           K21.00, Gastro-esophageal reflux disease with                            esophagitis, without bleeding                           K31.89, Other diseases of stomach and duodenum                           R11.2,  Nausea with vomiting, unspecified CPT copyright 2019 [employer]. All rights reserved. The codes documented in this report are preliminary and upon coder review may  be revised to meet current compliance requirements. Thornton Park MD, MD 02/18/2020 3:01:20 PM This report has been signed electronically. Number of Addenda: 0

## 2020-02-18 NOTE — Anesthesia Postprocedure Evaluation (Signed)
Anesthesia Post Note  [name]  Procedure(s) Performed: ESOPHAGOGASTRODUODENOSCOPY (EGD) WITH PROPOFOL (N/A ) BIOPSY     Patient location during evaluation: Endoscopy Anesthesia Type: MAC Level of consciousness: awake and alert Pain management: pain level controlled Vital Signs Assessment: post-procedure vital signs reviewed and stable Respiratory status: spontaneous breathing, nonlabored ventilation, respiratory function stable and patient connected to nasal cannula oxygen Cardiovascular status: blood pressure returned to baseline and stable Postop Assessment: no apparent nausea or vomiting Anesthetic complications: no   No complications documented.  Last Vitals:  Vitals:   02/18/20 1451 02/18/20 1505  BP: (!) 172/56 (!) 192/77  Pulse:  91  Resp:  19  Temp:  36.8 C  SpO2:  97%    Last Pain:  Vitals:   02/18/20 1440  TempSrc:   PainSc: 4                  Barnet Glasgow

## 2020-02-18 NOTE — Progress Notes (Signed)
   02/18/20 1035  Assess: MEWS Score  Temp 98.7 F (37.1 C)  BP (!) 208/87  Pulse Rate 91  Resp 18  Level of Consciousness Alert  SpO2 99 %  O2 Device Room Air  Assess: MEWS Score  MEWS Temp 0  MEWS Systolic 2  MEWS Pulse 0  MEWS RR 0  MEWS LOC 0  MEWS Score 2  MEWS Score Color Yellow  Assess: if the MEWS score is Yellow or Red  Were vital signs taken at a resting state? Yes  Focused Assessment No change from prior assessment  Early Detection of Sepsis Score *See Row Information* Low  MEWS guidelines implemented *See Row Information* Yes  Treat  MEWS Interventions Administered prn meds/treatments;Other (Comment) (EKG, troponin)  Take Vital Signs  Increase Vital Sign Frequency  Yellow: Q 2hr X 2 then Q 4hr X 2, if remains yellow, continue Q 4hrs  Escalate  MEWS: Escalate Yellow: discuss with charge nurse/RN and consider discussing with provider and RRT  Notify: Charge Nurse/RN  Name of Charge Nurse/RN Notified Jarrett Soho, RN  Date Charge Nurse/RN Notified 02/18/20  Time Charge Nurse/RN Notified [id number]  Notify: Provider  Provider Name/Title Gherghe  Date Provider Notified 02/18/20  Time Provider Notified 1040  Notification Type Page  Notification Reason Change in status  Response See new orders  Date of Provider Response 02/18/20  Time of Provider Response 1040  Document  Progress note created (see row info) Yes   Pt in Yellow MEWS d/t BP: 208/87. Pt is A&Ox4. Pt has complaints of nausea and abdominal pain rated 5/10. Pt informed RN upon entering room of sharp chest pain rated 7/10. RN informed MD. New orders for EKG and troponin placed. RN administered prn dilaudid, phenerghan, and hydralazine. Will continue to monitor closely.

## 2020-02-18 NOTE — Progress Notes (Addendum)
Bonnie Peterson   DOB:Jun 27, 1945   VP#:[id number]   NIO#:[id number]  Oncology follow up   Subjective: Feels better this morning.  Still with right upper quadrant abdominal pain but denies nausea vomiting this morning.  She was able to drink some cranberry juice and apple juice.  Reported bowel movement yesterday.  Objective:  Vitals:   02/18/20 0752 02/18/20 1035  BP: (!) 182/84 (!) 208/87  Pulse: 95 91  Resp: 19 18  Temp: 98.1 F (36.7 C) 98.7 F (37.1 C)  SpO2: 99% 99%    Body mass index is 23.04 kg/m.  Intake/Output Summary (Last 24 hours) at 02/18/2020 1048 Last data filed at 02/18/2020 0900 Gross per 24 hour  Intake 118 ml  Output 1 ml  Net 117 ml     Sclerae unicteric  Oropharynx clear  No peripheral adenopathy  Lungs clear -- no rales or rhonchi  Heart regular rate and rhythm  Abdomen soft, not distended, mild tenderness with light palpation to the right upper quadrant  MSK no focal spinal tenderness, no peripheral edema  Neuro nonfocal   CBG (last 3)  Recent Labs    02/17/20 1122 02/17/20 1721 02/18/20 0715  GLUCAP 130* 144* 150*     Labs:  Urine Studies No results for input(s): UHGB, CRYS in the last 72 hours.  Invalid input(s): UACOL, UAPR, USPG, UPH, UTP, UGL, UKET, UBIL, UNIT, UROB, ULEU, UEPI, UWBC, URBC, UBAC, CAST, UCOM, [place]  Basic Metabolic Panel: Recent Labs  Lab 02/14/20 1112 02/14/20 1112 02/14/20 1307 02/15/20 0417 02/15/20 0417 02/16/20 0413 02/16/20 0413 02/17/20 0612 02/18/20 0405  NA 145  --   --  139  --  137  --  138 140  K 3.0*   < >  --  3.0*   < > 3.4*   < > 4.3 4.0  CL 98  --   --  99  --  101  --  104 106  CO2 33*  --   --  30  --  28  --  28 26  GLUCOSE 103*  --   --  121*  --  120*  --  170* 165*  BUN 20  --   --  16  --  10  --  8 6*  CREATININE 0.73  --   --  0.68  --  0.54  --  0.60 0.50  CALCIUM 8.8*  --   --  8.3*  --  8.2*  --  8.5* 8.8*  MG  --   --  2.2  --   --  2.0  --  1.8  --   PHOS  --   --   --   --    --   --   --  2.2*  --    < > = values in this interval not displayed.   GFR Estimated Creatinine Clearance: 51.8 mL/min (by C-G formula based on SCr of 0.5 mg/dL). Liver Function Tests: Recent Labs  Lab 02/14/20 1112 02/15/20 0417 02/17/20 0612 02/18/20 0405  AST 34 33 38 31  ALT 17 17 21 21   ALKPHOS 78 76 75 77  BILITOT 1.2 0.7 1.0 0.6  PROT 6.9 6.1* 6.2* 6.3*  ALBUMIN 3.1* 2.8* 2.5* 2.5*   Recent Labs  Lab 02/14/20 1112  LIPASE 20   No results for input(s): AMMONIA in the last 168 hours. Coagulation profile No results for input(s): INR, PROTIME in the last 168 hours.  CBC: Recent Labs  Lab 02/14/20 1112 02/15/20 0417 02/16/20 0413 02/17/20 0612 02/18/20 0405  WBC 5.7 4.7 5.2 5.5 7.2  HGB 12.2 11.6* 12.1 11.3* 11.5*  HCT 39.5 38.1 38.0 36.0 36.5  MCV 94.3 94.8 93.1 94.2 92.2  PLT 136* 110* 111* 102* 114*   Cardiac Enzymes: No results for input(s): CKTOTAL, CKMB, CKMBINDEX, TROPONINI in the last 168 hours. BNP: Invalid input(s): POCBNP CBG: Recent Labs  Lab 02/16/20 1740 02/17/20 0806 02/17/20 1122 02/17/20 1721 02/18/20 0715  GLUCAP 114* 146* 130* 144* 150*   D-Dimer No results for input(s): DDIMER in the last 72 hours. Hgb A1c No results for input(s): HGBA1C in the last 72 hours. Lipid Profile No results for input(s): CHOL, HDL, LDLCALC, TRIG, CHOLHDL, LDLDIRECT in the last 72 hours. Thyroid function studies No results for input(s): TSH, T4TOTAL, T3FREE, THYROIDAB in the last 72 hours.  Invalid input(s): FREET3 Anemia work up No results for input(s): VITAMINB12, FOLATE, FERRITIN, TIBC, IRON, RETICCTPCT in the last 72 hours. Microbiology Recent Results (from the past 240 hour(s))  Respiratory Panel by RT PCR (Flu A&B, Covid) - Nasopharyngeal Swab     Status: None   Collection Time: 02/14/20  3:45 PM   Specimen: Nasopharyngeal Swab  Result Value Ref Range Status   SARS Coronavirus 2 by RT PCR NEGATIVE NEGATIVE Final    Comment:  (NOTE) SARS-CoV-2 target nucleic acids are NOT DETECTED.  The SARS-CoV-2 RNA is generally detectable in upper respiratoy specimens during the acute phase of infection. The lowest concentration of SARS-CoV-2 viral copies this assay can detect is 131 copies/mL. A negative result does not preclude SARS-Cov-2 infection and should not be used as the sole basis for treatment or other patient management decisions. A negative result may occur with  improper specimen collection/handling, submission of specimen other than nasopharyngeal swab, presence of viral mutation(s) within the areas targeted by this assay, and inadequate number of viral copies (<131 copies/mL). A negative result must be combined with clinical observations, patient history, and epidemiological information. The expected result is Negative.  Fact Sheet for Patients:  PinkCheek.be  Fact Sheet for Healthcare Providers:  GravelBags.it  This test is no t yet approved or cleared by the [place] FDA and  has been authorized for detection and/or diagnosis of SARS-CoV-2 by FDA under an Emergency Use Authorization (EUA). This EUA will remain  in effect (meaning this test can be used) for the duration of the COVID-19 declaration under Section 564(b)(1) of the Act, 21 U.S.C. section 360bbb-3(b)(1), unless the authorization is terminated or revoked sooner.     Influenza A by PCR NEGATIVE NEGATIVE Final   Influenza B by PCR NEGATIVE NEGATIVE Final    Comment: (NOTE) The Xpert Xpress SARS-CoV-2/FLU/RSV assay is intended as an aid in  the diagnosis of influenza from Nasopharyngeal swab specimens and  should not be used as a sole basis for treatment. Nasal washings and  aspirates are unacceptable for Xpert Xpress SARS-CoV-2/FLU/RSV  testing.  Fact Sheet for Patients: PinkCheek.be  Fact Sheet for Healthcare  Providers: GravelBags.it  This test is not yet approved or cleared by the [place] FDA and  has been authorized for detection and/or diagnosis of SARS-CoV-2 by  FDA under an Emergency Use Authorization (EUA). This EUA will remain  in effect (meaning this test can be used) for the duration of the  Covid-19 declaration under Section 564(b)(1) of the Act, 21  U.S.C. section 360bbb-3(b)(1), unless the authorization is  terminated or revoked. Performed at [facility], [address] [place], [place] [address]       Studies:  CT CHEST W CONTRAST  Result Date: 02/17/2020 CLINICAL DATA:  Intrahepatic cholangiocarcinoma. Restaging examination. EXAM: CT CHEST, ABDOMEN, AND PELVIS WITH CONTRAST TECHNIQUE: Multidetector CT imaging of the chest, abdomen and pelvis was performed following the standard protocol during bolus administration of intravenous contrast. CONTRAST:  139m OMNIPAQUE IOHEXOL 300 MG/ML  SOLN COMPARISON:  CT abdomen pelvis 01/14/2020 and 01/11/2020, CT chest 07/29/2017 FINDINGS: CT CHEST FINDINGS Cardiovascular: Moderate multi-vessel coronary artery calcification. Global cardiac size within normal limits. Lipomatous hypertrophy of the intra-atrial septum noted. Mild thinning of the left ventricular apex suggests remote myocardial infarction or chronic ischemia in this location. No pericardial effusion. The central pulmonary arteries are enlarged suggesting changes of pulmonary arterial hypertension. Extensive atherosclerotic calcification noted within the thoracic aorta. The thoracic aorta is mildly dilated measuring 4.0 cm in greatest dimension within the ascending segment and 3.2 cm in greatest dimension within the arch. The thoracic aorta demonstrates a more normal caliber at the level of the left atrium measuring 2.5 cm in dimension. Right internal jugular chest port catheter tip noted within the superior cavoatrial junction.  Mediastinum/Nodes: Thyroid unremarkable. No pathologic thoracic adenopathy. Mild circumferential thickening of the distal esophageal wall may reflect changes of mild esophagitis, as can be seen with reflux esophagitis. The esophagus is otherwise unremarkable. Lungs/Pleura: Mild emphysema again noted. Bibasilar atelectasis is noted. 5 mm subpleural pulmonary nodule within the right middle lobe, axial image # 90/4, is stable since remote prior examination and is safely considered benign given its stability over time. No new focal pulmonary nodule or infiltrate. No pneumothorax or pleural effusion. Central airways are widely patent. Musculoskeletal: Osseous structures are diffusely osteopenic. Advanced degenerative changes are seen within the thoracic spine. Flowing osteophytes throughout the thoracic spine are in keeping with changes of diffuse idiopathic skeletal hyperostosis. No focal lytic or blastic bone lesions are identified. CT ABDOMEN PELVIS FINDINGS Hepatobiliary: Surgical changes of left hepatectomy are again identified. Multiple enhancing masses are again seen scattered throughout the residual right hepatic lobe in keeping with recurrent cholangiocarcinoma. Exact measurement is difficult as many of these variably enhancing masses form irregular conglomerates, however, these appear grossly stable since prior examination. By example: Conglomerate within the superior right hepatic lobe, axial image # 55/2, measures 6.1 x 6.5 cm (previously measuring 6.0 x 7.6 cm). Conglomerate within the inferior right hepatic lobe, axial image # 74/2, measures 4.5 x 7.0 cm (previously measuring 4.3 x 7.4 cm). Focal enhancing mass within the right hepatic lobe, axial image # 70/2, measures 2.6 x 3.2 cm (previously measuring 2.5 x 2.8 cm). No intrahepatic biliary ductal dilation. Status post cholecystectomy. The extrahepatic bile duct is not dilated. Thrombosis of the portal vein is again identified Pancreas: Unremarkable  Spleen: Unremarkable Adrenals/Urinary Tract: Adrenal glands are unremarkable. Kidneys are normal, without renal calculi, focal lesion, or hydronephrosis. Bladder is unremarkable. Stomach/Bowel: There is circumferential mural thickening and edema involving the distal body of the stomach and antrum, similar to that noted a on prior examination, possibly reflecting changes of portal gastropathy given chronic occlusion of the portal vein. Alternatively, this can be seen in caustic or infectious gastritis. No evidence of obstruction or perforation. Multiple pedunculated lipomas are again seen within the colon measuring 14 mm within the distal transverse colon and 3.0 cm within the sigmoid colon. The small and large bowel are otherwise unremarkable. The appendix is unremarkable. Trace ascites is present, similar to prior examination. No free intraperitoneal  gas. Vascular/Lymphatic: Extensive aortoiliac atherosclerotic calcification. No aortic aneurysm. Shotty, enhancing left periaortic lymphadenopathy is stable, measuring 11 mm on axial image # 69/2. No frankly pathologic adenopathy within the abdomen and pelvis. Reproductive: Status post hysterectomy. No adnexal masses. Other: Rectum unremarkable Musculoskeletal: Advanced degenerative changes are seen within the lumbar spine. No lytic or blastic bone lesions are seen. IMPRESSION: Stable disease. Status post left hepatectomy with multiple recurrent conglomerate enhancing masses within the residual right hepatic lobe. No intrahepatic biliary ductal dilation. Malignant masses replace roughly 50% of the liver parenchyma. Chronic thrombosis of the portal vein. Stable thickening and edema of the distal body of the stomach and gastric antrum possibly representing changes of portal gastropathy. Note that caustic or infectious gastritis could appear similarly and correlation with endoscopy may be helpful for definitive evaluation. No evidence of pulmonary metastatic disease.  Moderate multi-vessel coronary artery calcification. Mild dilation of the thoracic aorta. Further follow-up is dependent upon the patient's prognosis related to their underlying malignancy. This can be reassessed on subsequent surveillance imaging for the patient's primary malignancy. Aortic Atherosclerosis (ICD10-I70.0) and Emphysema (ICD10-J43.9). Aortic aneurysm NOS (ICD10-I71.9). Electronically Signed   By: Fidela Salisbury MD   On: 02/17/2020 20:41   CT ABDOMEN PELVIS W CONTRAST  Result Date: 02/17/2020 CLINICAL DATA:  Intrahepatic cholangiocarcinoma. Restaging examination. EXAM: CT CHEST, ABDOMEN, AND PELVIS WITH CONTRAST TECHNIQUE: Multidetector CT imaging of the chest, abdomen and pelvis was performed following the standard protocol during bolus administration of intravenous contrast. CONTRAST:  164m OMNIPAQUE IOHEXOL 300 MG/ML  SOLN COMPARISON:  CT abdomen pelvis 01/14/2020 and 01/11/2020, CT chest 07/29/2017 FINDINGS: CT CHEST FINDINGS Cardiovascular: Moderate multi-vessel coronary artery calcification. Global cardiac size within normal limits. Lipomatous hypertrophy of the intra-atrial septum noted. Mild thinning of the left ventricular apex suggests remote myocardial infarction or chronic ischemia in this location. No pericardial effusion. The central pulmonary arteries are enlarged suggesting changes of pulmonary arterial hypertension. Extensive atherosclerotic calcification noted within the thoracic aorta. The thoracic aorta is mildly dilated measuring 4.0 cm in greatest dimension within the ascending segment and 3.2 cm in greatest dimension within the arch. The thoracic aorta demonstrates a more normal caliber at the level of the left atrium measuring 2.5 cm in dimension. Right internal jugular chest port catheter tip noted within the superior cavoatrial junction. Mediastinum/Nodes: Thyroid unremarkable. No pathologic thoracic adenopathy. Mild circumferential thickening of the distal esophageal  wall may reflect changes of mild esophagitis, as can be seen with reflux esophagitis. The esophagus is otherwise unremarkable. Lungs/Pleura: Mild emphysema again noted. Bibasilar atelectasis is noted. 5 mm subpleural pulmonary nodule within the right middle lobe, axial image # 90/4, is stable since remote prior examination and is safely considered benign given its stability over time. No new focal pulmonary nodule or infiltrate. No pneumothorax or pleural effusion. Central airways are widely patent. Musculoskeletal: Osseous structures are diffusely osteopenic. Advanced degenerative changes are seen within the thoracic spine. Flowing osteophytes throughout the thoracic spine are in keeping with changes of diffuse idiopathic skeletal hyperostosis. No focal lytic or blastic bone lesions are identified. CT ABDOMEN PELVIS FINDINGS Hepatobiliary: Surgical changes of left hepatectomy are again identified. Multiple enhancing masses are again seen scattered throughout the residual right hepatic lobe in keeping with recurrent cholangiocarcinoma. Exact measurement is difficult as many of these variably enhancing masses form irregular conglomerates, however, these appear grossly stable since prior examination. By example: Conglomerate within the superior right hepatic lobe, axial image # 55/2, measures 6.1 x 6.5 cm (previously measuring 6.0  x 7.6 cm). Conglomerate within the inferior right hepatic lobe, axial image # 74/2, measures 4.5 x 7.0 cm (previously measuring 4.3 x 7.4 cm). Focal enhancing mass within the right hepatic lobe, axial image # 70/2, measures 2.6 x 3.2 cm (previously measuring 2.5 x 2.8 cm). No intrahepatic biliary ductal dilation. Status post cholecystectomy. The extrahepatic bile duct is not dilated. Thrombosis of the portal vein is again identified Pancreas: Unremarkable Spleen: Unremarkable Adrenals/Urinary Tract: Adrenal glands are unremarkable. Kidneys are normal, without renal calculi, focal lesion, or  hydronephrosis. Bladder is unremarkable. Stomach/Bowel: There is circumferential mural thickening and edema involving the distal body of the stomach and antrum, similar to that noted a on prior examination, possibly reflecting changes of portal gastropathy given chronic occlusion of the portal vein. Alternatively, this can be seen in caustic or infectious gastritis. No evidence of obstruction or perforation. Multiple pedunculated lipomas are again seen within the colon measuring 14 mm within the distal transverse colon and 3.0 cm within the sigmoid colon. The small and large bowel are otherwise unremarkable. The appendix is unremarkable. Trace ascites is present, similar to prior examination. No free intraperitoneal gas. Vascular/Lymphatic: Extensive aortoiliac atherosclerotic calcification. No aortic aneurysm. Shotty, enhancing left periaortic lymphadenopathy is stable, measuring 11 mm on axial image # 69/2. No frankly pathologic adenopathy within the abdomen and pelvis. Reproductive: Status post hysterectomy. No adnexal masses. Other: Rectum unremarkable Musculoskeletal: Advanced degenerative changes are seen within the lumbar spine. No lytic or blastic bone lesions are seen. IMPRESSION: Stable disease. Status post left hepatectomy with multiple recurrent conglomerate enhancing masses within the residual right hepatic lobe. No intrahepatic biliary ductal dilation. Malignant masses replace roughly 50% of the liver parenchyma. Chronic thrombosis of the portal vein. Stable thickening and edema of the distal body of the stomach and gastric antrum possibly representing changes of portal gastropathy. Note that caustic or infectious gastritis could appear similarly and correlation with endoscopy may be helpful for definitive evaluation. No evidence of pulmonary metastatic disease. Moderate multi-vessel coronary artery calcification. Mild dilation of the thoracic aorta. Further follow-up is dependent upon the patient's  prognosis related to their underlying malignancy. This can be reassessed on subsequent surveillance imaging for the patient's primary malignancy. Aortic Atherosclerosis (ICD10-I70.0) and Emphysema (ICD10-J43.9). Aortic aneurysm NOS (ICD10-I71.9). Electronically Signed   By: Fidela Salisbury MD   On: 02/17/2020 20:41    Assessment: 74 y.o. with metastatic cholangiocarcinoma, currently under hospice care, persistent nausea and vomiting.  1.  Refractory nausea and vomiting, probably secondary to gastric outlet obstruction from her metastatic cancer 2. Metastatic cholangiocarcinoma 3. Severe protein and calori malnutrition  4. HTN  5. Hypokalemia   Plan:  -appreciate palliative care input.  -She tells me that she feels better this morning and did not have any nausea or vomiting.  Was able to keep down some clear liquids. -CT from yesterday shows some thickening and edema in the distal body of the stomach antrum.  Hospitalist has consulted GI. -Recommend clear liquid diet only for now. -Continue as needed antiemetics. -pt plans to go home with hospice.   Mikey Bussing, NP 02/18/2020  10:48 AM  Addendum  I have seen the patient, examined her. I agree with the assessment and and plan and have edited the notes.   Pt underwent EGD by Dr. Tarri Glenn today, which showed esophagitis, deformity and prepyloric region of the stomach which likely caused delayed gastric emptying but is not complete gastric obstruction.  No endoscopic interventions recommended.  I discussed with patient and  her daughter [name] at the bedside, her symptoms likely related to her diffuse liver metastasis. I do not think GJ tube is much beneficial, and I recommend clear liquid and medical management. Palliative medicine Dr. Rowe Pavy is leading this. I will f/u as needed.   Truitt Merle  02/18/2020

## 2020-02-18 NOTE — Progress Notes (Signed)
Bonnie Peterson [address] [facility]) Hospitalized Hospice Patient   Bonnie Peterson is a current hospice patient with a terminal diagnosis of intrahepatic cholangiocarcinoma per Dr. Cherie Ouch, [facility] MD. Patient reports more than a week of intractable nausea and vomiting (N/V) despite multiple [facility] case manager visits and adjustments to medications.  EMS was activated by family after making hospice triage RN aware. She under observation for intractable nausea and vomiting.  Per Dr. Karie Georges this is a related admission. Visited patient at beside.  Daughter Bonnie Peterson present for visit.  Pt denied N/V, respirations even/unlabored.  Pt endorsed fatigue after EGD procedure today.  When asked by this RN if she would be interested in tube placement she deferred, stating, Id have to see.  Bonnie Peterson denied new questions or unmet needs at this time. VS: 98 axillary, 157/78, 98HR, 16 RR, 99%RA Abnormal Labs: Ca 8.8, Albumin 2.5, Hgb 11.5, platelets 114 Diagnostics:  -CT Abdomen and Chest:  IMPRESSION: Stable disease. Status post left hepatectomy with multiple recurrent conglomerate enhancing masses within the residual right hepatic lobe. No intrahepatic biliary ductal dilation. Malignant masses replace roughly 50% of the liver parenchyma.   Chronic thrombosis of the portal vein. Stable thickening and edema of the distal body of the stomach and gastric antrum possibly representing changes of portal gastropathy. Note that caustic or infectious gastritis could appear similarly and correlation with endoscopy may be helpful for definitive evaluation.   No evidence of pulmonary metastatic disease.   Moderate multi-vessel coronary artery calcification.   Mild dilation of the thoracic aorta. Further follow-up is dependent upon the patient's prognosis related to their underlying malignancy. This can be reassessed on subsequent surveillance imaging for the patient's primary malignancy.   Aortic Atherosclerosis  (ICD10-I70.0) and Emphysema (ICD10-J43.9). Aortic aneurysm NOS (ICD10-I71.9).    -EGD:   Impression:  1. LA Grade D reflux esophagitis, biopsied  2.  Erythematous mucosa in gastric fundus, gastric body, and antrum, biopsied for H. pylori   3.  Deformity in the prepyloric region that likely causes delayed emptying but not obstruction  4 Given these findings symptom management is challenging. No endoscopic interventions available to minimize symptoms  IVs/PRNs: D5-0.9% NS with 40 mEq K@ 94m/hr continuous infusion;   Ativan 0.5 mg q4h PRN anxiety ordered (0 dose); Dilaudid 0.5 mg PIV q2h (2 doses), Phenergan 6.218mIV q6h (3 doses), hydralazine 10 mg PIV q4O2VRN systolic>170 x 3 doses  Problem List: 1. Intractable N/V a. IV Fluids for rehydration, IV antiemetics b. Discussion of possible venting PEG c. EGD done  2. Hypokalemia a. IV K replacement 3. Hypertension a. Dced PO meds b. Started clonidine patch and IV meds PRN  GOC: DNR, family advocating for something to manage n/v at home D/C planning: home once optimized, may need to establish pt with C[facility] for meds Family: Daughter RoShirlean Mylart bedside for visit IDT: Updated hospice team  ChDomenic MorasBSN, RN ACC[facility] [phone] (24h on call)

## 2020-02-19 DIAGNOSIS — R112 Nausea with vomiting, unspecified: Secondary | ICD-10-CM | POA: Diagnosis not present

## 2020-02-19 DIAGNOSIS — Z7189 Other specified counseling: Secondary | ICD-10-CM | POA: Diagnosis not present

## 2020-02-19 DIAGNOSIS — R52 Pain, unspecified: Secondary | ICD-10-CM

## 2020-02-19 DIAGNOSIS — C221 Intrahepatic bile duct carcinoma: Secondary | ICD-10-CM | POA: Diagnosis not present

## 2020-02-19 DIAGNOSIS — Z515 Encounter for palliative care: Secondary | ICD-10-CM | POA: Diagnosis not present

## 2020-02-19 LAB — GLUCOSE, CAPILLARY
Glucose-Capillary: 145 mg/dL — ABNORMAL HIGH (ref 70–99)
Glucose-Capillary: 144 mg/dL — ABNORMAL HIGH (ref 70–99)
Glucose-Capillary: 128 mg/dL — ABNORMAL HIGH (ref 70–99)
Glucose-Capillary: 119 mg/dL — ABNORMAL HIGH (ref 70–99)

## 2020-02-19 LAB — COMPREHENSIVE METABOLIC PANEL
ALT: 20 U/L (ref 0–44)
AST: 31 U/L (ref 15–41)
Albumin: 2.9 g/dL — ABNORMAL LOW (ref 3.5–5.0)
Alkaline Phosphatase: 77 U/L (ref 38–126)
Anion gap: 11 (ref 5–15)
BUN: 7 mg/dL — ABNORMAL LOW (ref 8–23)
CO2: 24 mmol/L (ref 22–32)
Calcium: 9.1 mg/dL (ref 8.9–10.3)
Chloride: 105 mmol/L (ref 98–111)
Creatinine, Ser: 0.66 mg/dL (ref 0.44–1.00)
GFR, Estimated: >60 mL/min (ref >60)
Glucose, Bld: 139 mg/dL — ABNORMAL HIGH (ref 70–99)
Potassium: 4.3 mmol/L (ref 3.5–5.1)
Sodium: 140 mmol/L (ref 135–145)
Total Bilirubin: 0.8 mg/dL (ref 0.3–1.2)
Total Protein: 6.5 g/dL (ref 6.5–8.1)

## 2020-02-19 LAB — CBC
HCT: 37.1 % (ref 36.0–46.0)
Hemoglobin: 11.9 g/dL — ABNORMAL LOW (ref 12.0–15.0)
MCH: 29.8 pg (ref 26.0–34.0)
MCHC: 32.1 g/dL (ref 30.0–36.0)
MCV: 92.8 fL (ref 80.0–100.0)
Platelets: 139 10*3/uL — ABNORMAL LOW (ref 150–400)
RBC: 4 MIL/uL (ref 3.87–5.11)
RDW: 15.5 % (ref 11.5–15.5)
WBC: 9.9 10*3/uL (ref 4.0–10.5)
nRBC: 0 % (ref 0.0–0.2)

## 2020-02-19 MED ORDER — CLONIDINE HCL 0.3 MG/24HR TD PTWK
0.3000 mg | MEDICATED_PATCH | TRANSDERMAL | Status: DC
  Administered 2020-02-19: 0.3 mg via TRANSDERMAL
  Filled 2020-02-19: qty 1

## 2020-02-19 MED ORDER — SODIUM CHLORIDE 0.9% FLUSH
10.0000 mL | INTRAVENOUS | Status: DC | PRN
  Administered 2020-02-23: 10 mL

## 2020-02-19 MED ORDER — SODIUM CHLORIDE 0.9% FLUSH
10.0000 mL | Freq: Two times a day (BID) | INTRAVENOUS | Status: DC
  Administered 2020-02-19 (×2): 10 mL
  Administered 2020-02-20 (×2): 20 mL
  Administered 2020-02-21 – 2020-02-22 (×2): 10 mL
  Administered 2020-02-22: 20 mL

## 2020-02-19 NOTE — Progress Notes (Signed)
Bonnie Peterson [address] [facility]) Hospitalized Hospice Patient   Bonnie Peterson is a current hospice patient with a terminal diagnosis of intrahepatic cholangiocarcinoma per Dr. Cherie Ouch, [facility] MD. Patient reports more than a week of intractable nausea and vomiting (N/V) despite multiple [facility] case manager visits and adjustments to medications.  EMS was activated by family after making hospice triage RN aware. She under observation for intractable nausea and vomiting.  Per Dr. Karie Georges this is a related admission. Visited patient at beside.  Pt appeared to be much more comfortable, smiling and cracking jokes with this RN.  Pt verbalizes understanding of pending tube placement and shares hopes that she will be able to eat again without vomiting.  Pt shares that she had two BM's yesterday, one quite large, and verbalizes belief that this has helped tremendously.  No family at bedside. VS: 98 axillary, 157/78, 98HR, 16 RR, 99%RA Abnormal Labs:  Albumin 2.9, Hgb 11.9, platelets 139 Diagnostics: None new today IVs/PRNs: D5-0.9% NS with 40 mEq K@ 18m/hr continuous infusion;   Ativan 0.5 mg q4h PRN anxiety ordered (0 dose); Dilaudid 0.5 mg PIV q2h (2 doses), Phenergan 6.275mIV q6h (3 doses), hydralazine 10 mg PIV q4X7ORN systolic>170 x 3 doses  Problem List: 1. Intractable N/V a. IV Fluids for rehydration, IV antiemetics b. Discussion of possible venting PEG c. EGD done  2. Hypokalemia a. IV K replacement 3. Hypertension a. Dc'ed PO meds b. Started clonidine patch and IV meds PRN  GOC: DNR, family advocating for something to manage n/v at home D/C planning: home once optimized, may need to establish pt with CADD for meds Family: no family at bedside, pt AOx4 IDT: Updated hospice team  Thank you for the opportunity to participate in this patient's care.     Chrislyn KiEdison PaceBSN, RN ACFoleylisted on [facility])    [phone]

## 2020-02-19 NOTE — Progress Notes (Signed)
Daily Progress Note   Patient Name: Bonnie Peterson       Date: 02/17/2020 DOB: 1945/12/22  Age: 74 y.o. MRN#: [id number] Attending Physician: Bonnie Griffins, MD Primary Care Physician: Bonnie Haw, MD Admit Date: 02/14/2020  Reason for Consultation/Follow-up: Establishing goals of care  Subjective: Patient resting in bed, daughter Bonnie Peterson at bedside.  Patient had recurrent episodes of nausea vomiting within the last 24 hours.  Currently resting. Length of Stay: 2  Current Medications: Scheduled Meds:  . amLODipine  10 mg Oral Daily  . Chlorhexidine Gluconate Cloth  6 each Topical Daily  . lisinopril  40 mg Oral Daily  . metoCLOPramide (REGLAN) injection  5 mg Intravenous Q6H  . mirtazapine  15 mg Oral QHS  . polyethylene glycol  17 g Oral Daily  . senna-docusate  2 tablet Oral BID    Continuous Infusions: . dextrose 5 % and 0.9 % NaCl with KCl 40 mEq/L 50 mL/hr at 02/17/20 0200    PRN Meds: haloperidol, HYDROmorphone (DILAUDID) injection, LORazepam, promethazine, sodium chloride flush  Physical Exam         Asleep today  resting in bed Regular work of breathing S1-S2 No significant peripheral edema Nonfocal Abdomen soft nondistended  Vital Signs: BP (!) 200/82 (BP Location: Left Arm)   Pulse 93   Temp 98 F (36.7 C) (Oral)   Resp 18   Ht 5' 3"  (1.6 m)   Wt 59 kg   SpO2 98%   BMI 23.04 kg/m  SpO2: SpO2: 98 % O2 Device: O2 Device: Room Air O2 Flow Rate:    Intake/output summary:   Intake/Output Summary (Last 24 hours) at 02/17/2020 1121 Last data filed at 02/17/2020 0200 Gross per 24 hour  Intake 1444.32 ml  Output --  Net 1444.32 ml   LBM: Last BM Date:  ("two weeks ago") Baseline Weight: Weight: 59 kg Most recent weight: Weight: 59 kg        Palliative Assessment/Data:   Palliative performance scale 40%  Patient Active Problem List   Diagnosis Date Noted  . Cholangiocarcinoma ([place]) 02/15/2020  . Intractable nausea and vomiting 01/14/2020  . Rectal bleeding   . Depression 07/19/2019  . Goals of care, counseling/discussion 06/11/2019  . Blood in stool 06/11/2019  . Acute pain of left lower extremity 01/05/2019  .  Gastroesophageal reflux disease without esophagitis 06/03/2018  . Lung nodules 05/04/2018  . Type 2 diabetes mellitus without complication, without long-term current use of insulin ([place]) 01/05/2017  . Healthcare maintenance 01/24/2016  . Back pain 11/23/2015  . Port catheter in place 11/10/2015  . 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 History of resection of liver 02/10/2015  . 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 Inadequate sleep hygiene 08/22/2014  . 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 IBS (irritable bowel syndrome) 03/22/2013  . Liver mass, left lobe 02/24/2013  . Osteoporosis 10/15/2012  . Vitamin D deficiency 12/23/2011  . Cigarette smoker 10/12/2010  . Chronic obstructive pulmonary disease ([place]) 06/14/2010  . Obstructive sleep apnea 04/27/2009  . OBESITY, UNSPECIFIED 04/26/2009  . Hyperlipidemia 11/25/2007  . GOUT NOS 12/24/2006  . Recurrent major depressive disorder ([place]) 06/05/2006  . Anxiety 06/05/2006  . Tobacco use 06/05/2006  . HYPERTENSION, BENIGN SYSTEMIC 06/05/2006  . Osteoarthritis 06/05/2006  . Generalized anxiety disorder 06/05/2006    Palliative Care Assessment & Plan   Patient Profile:    Assessment: Bonnie Peterson is 74 years old.  She has life limiting illness of metastatic cholangiocarcinoma.  She is currently under hospice care.  She has been admitted to the hospital for intractable symptoms of persistent nausea vomiting.  This is deemed secondary to gastric outlet obstruction from her  metastatic cancer.  She has hypertension and severe protein calorie malnutrition due to her life limiting illness of malignancy.    Recommendations/Plan: EGD on 02-18-20 showed severe esophagitis and gastritis, deformity in the prepyloric region of the stomach, interventional radiology input is being sought, a venting gastrostomy tube for symptom management is being considered.  I discussed with the patient's daughter Bonnie Peterson who was at the bedside, patient's family is all for any reasonable procedures and interventions that can alleviate symptoms and improve quality of life and some degree of life prolongation, they are typically aware of the life limiting serious irreversible illness of widely metastatic cholangiocarcinoma.  Continue current pain and non pain symptom management.    Goals of Care and Additional Recommendations: Limitations on Scope of Treatment: Avoid Hospitalization  Code Status:    Code Status Orders  (From admission, onward)         Start     Ordered   02/16/20 1058  Limited resuscitation (code)  Continuous       Question Answer Comment  In the event of cardiac or respiratory ARREST: Initiate Code Blue, Call Rapid Response Yes   In the event of cardiac or respiratory ARREST: Perform CPR No   In the event of cardiac or respiratory ARREST: Perform Intubation/Mechanical Ventilation Yes   In the event of cardiac or respiratory ARREST: Use NIPPV/BiPAp only if indicated Yes   In the event of cardiac or respiratory ARREST: Administer ACLS medications if indicated Yes   In the event of cardiac or respiratory ARREST: Perform Defibrillation or Cardioversion if indicated No   Comments as per family request.      02/16/20 1057        Code Status History    Date Active Date Inactive Code Status Order ID Comments User Context   02/14/2020 1844 02/16/2020 1057 DNR [id number]  Jonnie Finner, DO Inpatient   01/14/2020 1759 01/15/2020 2117 DNR [id number]  Jonnie Finner, DO  Inpatient   09/07/2015 1122 09/08/2015 0321 Full Code [id number]  Arne Cleveland, [place]   06/14/2015 1754 06/16/2015  1611 Full Code [id number]  Modena Jansky, MD Inpatient   10/04/2014 1412 10/05/2014 0321 Full Code [id number]  Markus Daft, MD [place] Planning Activity    Advance Directive Documentation     Most Recent Value  Type of Advance Directive Healthcare Power of Attorney  Pre-existing out of facility DNR order (yellow form or pink MOST form) --  "MOST" Form in Place? --      Prognosis:  guarded  Discharge Planning: To Be Determined  Care plan was discussed with  Patient, daughter Bonnie Peterson.   Thank you for allowing the Palliative Medicine Team to assist in the care of this patient.   Time In:  10 Time Out: 10.25 Total Time 25 Prolonged Time Billed  no       Greater than 50%  of this time was spent counseling and coordinating care related to the above assessment and plan.  Loistine Chance, MD  Please contact Palliative Medicine Team phone at [phone] for questions and concerns.

## 2020-02-19 NOTE — Progress Notes (Signed)
I returned a phone call to pts daughter, Kennyth Lose. Let her know pt had just had pain & nausea meds and was comfortable

## 2020-02-19 NOTE — Progress Notes (Signed)
PROGRESS NOTE  Bonnie Peterson HGD:[id number] DOB: 30-May-1945 DOA: 02/14/2020 PCP: Lattie Haw, MD   LOS: 4 days   Brief Narrative / Interim history: 74 year old female with intrahepatic adenocarcinoma with metastatic disease, came into the ED with intractable nausea and vomiting along with abdominal pain.  This has been going on for at least 3 weeks, progressively getting worse.  She has used Reglan, scopolamine, Phenergan at home but symptoms have worsened and came to the hospital.  She has hospice services at home but not comfort care at this point.  Oncology has been evaluated patient also, and it seems like her refractory nausea and vomiting are probably secondary to gastric outlet obstruction from her metastatic cancer, she is not a candidate for chemotherapy anymore.  Palliative care also consulted.  Subjective / 24h Interval events: Continues to have recurrent episodes of nausea and vomiting.  She feels quite sick to her stomach this morning  Assessment & Plan: Principal Problem Intractable nausea and vomiting -Possibly gastric outlet obstruction, CT scan in October showed mural thickening of the distal stomach, predominantly involving the antrum with a somewhat masslike appearance.  Thickening extends into the pyloric region as well as the duodenal bulb, possibly sequela of prior radiation therapy versus underlying mass. Not a candidate for chemotherapy per oncology -Due to persistent and progressive symptoms she underwent a repeat CT scan of the abdomen and pelvis 11/11 which showed again circumferential mural thickening and edema on the distal body of the stomach, antrum.  I have consulted gastroenterology, patient underwent an EGD on 11/12 which showed severe esophagitis, gastritis as well as a deformity in the prepyloric region of the stomach that likely caused delayed gastric emptying but there is no complete gastric obstruction.  There is no endoscopic intervention available to  minimize her symptoms per GI. -I discussed case with IR as well, it appears that her stomach is accessible for G tube placement however a J arm may be difficult to place.  This would be a two-step process.  Discussed G-tube placement with the patient this morning, she is not sure how and wants to talk more with the family.  I will call the daughter a little bit later today  Active Problems Widely metastatic cholangiocarcinoma -Not a candidate for further treatment.  Palliative consulted.  Patient and family have difficulties understanding gravity of her condition   Hypokalemia -Due to poor p.o. intake, continue to follow daily and supplement as needed  Essential hypertension -Still pretty hypotensive especially when she is actively having nausea and vomiting, will increase the clonidine  Type 2 diabetes mellitus -With poor p.o. intake avoid strict CBG control  Thrombocytopenia -Possibly related to malignancy, platelets overall stable, no bleeding  Goals of care -Remains partial code, appreciate palliative follow-up  Scheduled Meds: . Chlorhexidine Gluconate Cloth  6 each Topical Daily  . cloNIDine  0.3 mg Transdermal Weekly  . metoCLOPramide (REGLAN) injection  5 mg Intravenous Q6H  . polyethylene glycol  17 g Oral Daily  . senna-docusate  2 tablet Oral BID  . sodium chloride flush  10-40 mL Intracatheter Q12H   Continuous Infusions: . dextrose 5 % and 0.9 % NaCl with KCl 40 mEq/L 50 mL/hr at 02/18/20 0744   PRN Meds:.haloperidol, hydrALAZINE, HYDROmorphone (DILAUDID) injection, LORazepam, promethazine, sodium chloride flush, sodium chloride flush  Diet Orders (From admission, onward)    Start     Ordered   02/19/20 0811  Diet clear liquid Room service appropriate? Yes; Fluid consistency: Thin  Diet effective  now       Question Answer Comment  Room service appropriate? Yes   Fluid consistency: Thin      02/19/20 0810          DVT prophylaxis: SCDs Start: 02/14/20  1844     Code Status: Partial Code  Family Communication: We will call daughter later  Status is: Inpatient  Remains inpatient appropriate because:Ongoing active pain requiring inpatient pain management, IV treatments appropriate due to intensity of illness or inability to take PO and Inpatient level of care appropriate due to severity of illness   Dispo: The patient is from: Home              Anticipated d/c is to: Home              Anticipated d/c date is: 3 days              Patient currently is not medically stable to d/c.   Consultants:  Palliative care   Procedures:  EGD 11/12 Impression:               - LA Grade D reflux esophagitis with no bleeding.                            Biopsied to exclude infection. This may be a result                            of her ongoing vomiting as opposed to the cause of                            symptoms.                           - Erythematous mucosa in the gastric fundus,                            gastric body and antrum. Biopsied for H pylori.                           - Deformity in the prepyloric region of the stomach                            that likely causes delayed gastric emptying but not                            complete gastric obstruction.                           - Given these findings, management of her symptoms                            is challenging. No endoscopic interventions                            available to minimize her symptoms. Moderate Sedation:      Not Applicable - Patient had care per Anesthesia. Recommendation:           - Return patient to hospital ward for  ongoing care.                           - Clear liquid diet. Could consider trial of a very                            low residue diet.                           - Continue present medications. IV PPI BID. Add                            Carafate slurry QID.                           - Keep the head of the bed elevated as much as                             possible.                           - Await pathology results to exclude concurrent                            infectious processes.  Microbiology  None   Antimicrobials: None     Objective: Vitals:   02/18/20 2219 02/19/20 0218 02/19/20 0611 02/19/20 1026  BP: (!) 151/69 138/79 (!) 198/89 (!) 170/70  Pulse: 91 87 90 96  Resp: 19 14 19 17   Temp: 98.9 F (37.2 C) 98.9 F (37.2 C) 98 F (36.7 C) 98.5 F (36.9 C)  TempSrc: Oral Oral  Oral  SpO2: 96% 96% 98% 98%  Weight:      Height:        Intake/Output Summary (Last 24 hours) at 02/19/2020 1125 Last data filed at 02/19/2020 1093 Gross per 24 hour  Intake 1080 ml  Output 0 ml  Net 1080 ml   Filed Weights   02/16/20 2200  Weight: 59 kg    Examination:  Constitutional: No significant distress but appears quite uncomfortable, in bed Eyes: No scleral icterus ENMT: Moist mucous membranes Neck: normal, supple Respiratory: Lungs are clear, no wheezing or crackles heard Cardiovascular: Regular rate and rhythm, no murmurs, no peripheral edema Abdomen: Mild tenderness to palpation in the epigastric area, bowel sounds positive, no guarding or rebound Musculoskeletal: no clubbing / cyanosis.  Skin: No rashes seen Neurologic: Nonfocal, equal strength  Data Reviewed: I have independently reviewed following labs and imaging studies   CBC: Recent Labs  Lab 02/15/20 0417 02/16/20 0413 02/17/20 0612 02/18/20 0405 02/19/20 0529  WBC 4.7 5.2 5.5 7.2 9.9  HGB 11.6* 12.1 11.3* 11.5* 11.9*  HCT 38.1 38.0 36.0 36.5 37.1  MCV 94.8 93.1 94.2 92.2 92.8  PLT 110* 111* 102* 114* 235*   Basic Metabolic Panel: Recent Labs  Lab 02/14/20 1112 02/14/20 1307 02/15/20 0417 02/16/20 0413 02/17/20 0612 02/18/20 0405 02/19/20 0529  NA   < >  --  139 137 138 140 140  K   < >  --  3.0* 3.4* 4.3 4.0 4.3  CL   < >  --  99 101 104 106 105  CO2   < >  --  30 28 28 26 24   GLUCOSE   < >  --  121* 120* 170* 165*  139*  BUN   < >  --  16 10 8  6* 7*  CREATININE   < >  --  0.68 0.54 0.60 0.50 0.66  CALCIUM   < >  --  8.3* 8.2* 8.5* 8.8* 9.1  MG  --  2.2  --  2.0 1.8  --   --   PHOS  --   --   --   --  2.2*  --   --    < > = values in this interval not displayed.   Liver Function Tests: Recent Labs  Lab 02/14/20 1112 02/15/20 0417 02/17/20 0612 02/18/20 0405 02/19/20 0529  AST 34 33 38 31 31  ALT 17 17 21 21 20   ALKPHOS 78 76 75 77 77  BILITOT 1.2 0.7 1.0 0.6 0.8  PROT 6.9 6.1* 6.2* 6.3* 6.5  ALBUMIN 3.1* 2.8* 2.5* 2.5* 2.9*   Coagulation Profile: No results for input(s): INR, PROTIME in the last 168 hours. HbA1C: No results for input(s): HGBA1C in the last 72 hours. CBG: Recent Labs  Lab 02/18/20 1116 02/18/20 1704 02/19/20 0628 02/19/20 0755 02/19/20 1100  GLUCAP 146* 132* 145* 144* 128*    Recent Results (from the past 240 hour(s))  Respiratory Panel by RT PCR (Flu A&B, Covid) - Nasopharyngeal Swab     Status: None   Collection Time: 02/14/20  3:45 PM   Specimen: Nasopharyngeal Swab  Result Value Ref Range Status   SARS Coronavirus 2 by RT PCR NEGATIVE NEGATIVE Final    Comment: (NOTE) SARS-CoV-2 target nucleic acids are NOT DETECTED.  The SARS-CoV-2 RNA is generally detectable in upper respiratoy specimens during the acute phase of infection. The lowest concentration of SARS-CoV-2 viral copies this assay can detect is 131 copies/mL. A negative result does not preclude SARS-Cov-2 infection and should not be used as the sole basis for treatment or other patient management decisions. A negative result may occur with  improper specimen collection/handling, submission of specimen other than nasopharyngeal swab, presence of viral mutation(s) within the areas targeted by this assay, and inadequate number of viral copies (<131 copies/mL). A negative result must be combined with clinical observations, patient history, and epidemiological information. The expected result is  Negative.  Fact Sheet for Patients:  PinkCheek.be  Fact Sheet for Healthcare Providers:  GravelBags.it  This test is no t yet approved or cleared by the [place] FDA and  has been authorized for detection and/or diagnosis of SARS-CoV-2 by FDA under an Emergency Use Authorization (EUA). This EUA will remain  in effect (meaning this test can be used) for the duration of the COVID-19 declaration under Section 564(b)(1) of the Act, 21 U.S.C. section 360bbb-3(b)(1), unless the authorization is terminated or revoked sooner.     Influenza A by PCR NEGATIVE NEGATIVE Final   Influenza B by PCR NEGATIVE NEGATIVE Final    Comment: (NOTE) The Xpert Xpress SARS-CoV-2/FLU/RSV assay is intended as an aid in  the diagnosis of influenza from Nasopharyngeal swab specimens and  should not be used as a sole basis for treatment. Nasal washings and  aspirates are unacceptable for Xpert Xpress SARS-CoV-2/FLU/RSV  testing.  Fact Sheet for Patients: PinkCheek.be  Fact Sheet for Healthcare Providers: GravelBags.it  This test is not yet approved or cleared by the [place] FDA and  has been authorized for detection and/or diagnosis of SARS-CoV-2 by  FDA under  an Emergency Use Authorization (EUA). This EUA will remain  in effect (meaning this test can be used) for the duration of the  Covid-19 declaration under Section 564(b)(1) of the Act, 21  U.S.C. section 360bbb-3(b)(1), unless the authorization is  terminated or revoked. Performed at [facility], [place] [address] [place], [place] [address]     Time spent: 40 minutes, more than 50% involved in counseling/discussion with the patient as well as patient's family, discussing with IR  Radiology Studies: No results found.  Marzetta Board, MD, PhD [facility]  Between 7 am - 7 pm I am available,  please contact me via Amion or Securechat  Between 7 pm - 7 am I am not available, please contact night coverage MD/APP via Amion

## 2020-02-20 DIAGNOSIS — C221 Intrahepatic bile duct carcinoma: Secondary | ICD-10-CM | POA: Diagnosis not present

## 2020-02-20 DIAGNOSIS — R52 Pain, unspecified: Secondary | ICD-10-CM | POA: Diagnosis not present

## 2020-02-20 DIAGNOSIS — R112 Nausea with vomiting, unspecified: Secondary | ICD-10-CM | POA: Diagnosis not present

## 2020-02-20 DIAGNOSIS — Z515 Encounter for palliative care: Secondary | ICD-10-CM | POA: Diagnosis not present

## 2020-02-20 DIAGNOSIS — Z7189 Other specified counseling: Secondary | ICD-10-CM | POA: Diagnosis not present

## 2020-02-20 LAB — GLUCOSE, CAPILLARY
Glucose-Capillary: 125 mg/dL — ABNORMAL HIGH (ref 70–99)
Glucose-Capillary: 115 mg/dL — ABNORMAL HIGH (ref 70–99)
Glucose-Capillary: 122 mg/dL — ABNORMAL HIGH (ref 70–99)

## 2020-02-20 LAB — CBC
HCT: 34.4 % — ABNORMAL LOW (ref 36.0–46.0)
Hemoglobin: 10.5 g/dL — ABNORMAL LOW (ref 12.0–15.0)
MCH: 28.8 pg (ref 26.0–34.0)
MCHC: 30.5 g/dL (ref 30.0–36.0)
MCV: 94.5 fL (ref 80.0–100.0)
Platelets: 111 10*3/uL — ABNORMAL LOW (ref 150–400)
RBC: 3.64 MIL/uL — ABNORMAL LOW (ref 3.87–5.11)
RDW: 15.8 % — ABNORMAL HIGH (ref 11.5–15.5)
WBC: 5.2 10*3/uL (ref 4.0–10.5)
nRBC: 0 % (ref 0.0–0.2)

## 2020-02-20 LAB — COMPREHENSIVE METABOLIC PANEL
ALT: 17 U/L (ref 0–44)
AST: 26 U/L (ref 15–41)
Albumin: 2.4 g/dL — ABNORMAL LOW (ref 3.5–5.0)
Alkaline Phosphatase: 68 U/L (ref 38–126)
Anion gap: 7 (ref 5–15)
BUN: 9 mg/dL (ref 8–23)
CO2: 26 mmol/L (ref 22–32)
Calcium: 9.1 mg/dL (ref 8.9–10.3)
Chloride: 110 mmol/L (ref 98–111)
Creatinine, Ser: 0.63 mg/dL (ref 0.44–1.00)
GFR, Estimated: >60 mL/min (ref >60)
Glucose, Bld: 120 mg/dL — ABNORMAL HIGH (ref 70–99)
Potassium: 4.4 mmol/L (ref 3.5–5.1)
Sodium: 143 mmol/L (ref 135–145)
Total Bilirubin: 0.5 mg/dL (ref 0.3–1.2)
Total Protein: 5.7 g/dL — ABNORMAL LOW (ref 6.5–8.1)

## 2020-02-20 NOTE — Progress Notes (Signed)
Daily Progress Note   Patient Name: Bonnie Peterson       Date: 02/17/2020 DOB: 1945-11-06  Age: 74 y.o. MRN#: [id number] Attending Physician: Caren Griffins, MD Primary Care Physician: Lattie Haw, MD Admit Date: 02/14/2020  Reason for Consultation/Follow-up: Establishing goals of care  Subjective: Patient awake alert, complains of pain in back today, nausea vomiting better this morning.   Length of Stay: 2  Current Medications: Scheduled Meds:  . amLODipine  10 mg Oral Daily  . Chlorhexidine Gluconate Cloth  6 each Topical Daily  . lisinopril  40 mg Oral Daily  . metoCLOPramide (REGLAN) injection  5 mg Intravenous Q6H  . mirtazapine  15 mg Oral QHS  . polyethylene glycol  17 g Oral Daily  . senna-docusate  2 tablet Oral BID    Continuous Infusions: . dextrose 5 % and 0.9 % NaCl with KCl 40 mEq/L 50 mL/hr at 02/17/20 0200    PRN Meds: haloperidol, HYDROmorphone (DILAUDID) injection, LORazepam, promethazine, sodium chloride flush  Physical Exam         Awake alert  resting in bed Regular work of breathing S1-S2 No significant peripheral edema Nonfocal Abdomen soft nondistended  Vital Signs: BP (!) 200/82 (BP Location: Left Arm)   Pulse 93   Temp 98 F (36.7 C) (Oral)   Resp 18   Ht 5' 3"  (1.6 m)   Wt 59 kg   SpO2 98%   BMI 23.04 kg/m  SpO2: SpO2: 98 % O2 Device: O2 Device: Room Air O2 Flow Rate:    Intake/output summary:   Intake/Output Summary (Last 24 hours) at 02/17/2020 1121 Last data filed at 02/17/2020 0200 Gross per 24 hour  Intake 1444.32 ml  Output --  Net 1444.32 ml   LBM: Last BM Date:  ("two weeks ago") Baseline Weight: Weight: 59 kg Most recent weight: Weight: 59 kg       Palliative Assessment/Data:   Palliative performance scale  40%  Patient Active Problem List   Diagnosis Date Noted  . Cholangiocarcinoma ([place]) 02/15/2020  . Intractable nausea and vomiting 01/14/2020  . Rectal bleeding   . Depression 07/19/2019  . Goals of care, counseling/discussion 06/11/2019  . Blood in stool 06/11/2019  . Acute pain of left lower extremity 01/05/2019  . Gastroesophageal reflux disease without esophagitis 06/03/2018  .  Lung nodules 05/04/2018  . Type 2 diabetes mellitus without complication, without long-term current use of insulin ([place]) 01/05/2017  . Healthcare maintenance 01/24/2016  . Back pain 11/23/2015  . Port catheter in place 11/10/2015  . 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 History of resection of liver 02/10/2015  . 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 Inadequate sleep hygiene 08/22/2014  . 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 IBS (irritable bowel syndrome) 03/22/2013  . Liver mass, left lobe 02/24/2013  . Osteoporosis 10/15/2012  . Vitamin D deficiency 12/23/2011  . Cigarette smoker 10/12/2010  . Chronic obstructive pulmonary disease ([place]) 06/14/2010  . Obstructive sleep apnea 04/27/2009  . OBESITY, UNSPECIFIED 04/26/2009  . Hyperlipidemia 11/25/2007  . GOUT NOS 12/24/2006  . Recurrent major depressive disorder ([place]) 06/05/2006  . Anxiety 06/05/2006  . Tobacco use 06/05/2006  . HYPERTENSION, BENIGN SYSTEMIC 06/05/2006  . Osteoarthritis 06/05/2006  . Generalized anxiety disorder 06/05/2006    Palliative Care Assessment & Plan   Patient Profile:    Assessment: Ms. Vanderheiden is 74 years old.  She has life limiting illness of metastatic cholangiocarcinoma.  She is currently under hospice care.  She has been admitted to the hospital for intractable symptoms of persistent nausea vomiting.  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  She has hypertension and severe protein  calorie malnutrition due to her life limiting illness of malignancy.    Recommendations/Plan: EGD on 02-18-20 showed severe esophagitis and gastritis, deformity in the prepyloric region of the stomach, interventional radiology input is being sought, a venting gastrostomy tube for symptom management is being considered.  I discussed with the patient's daughter Kennyth Lose who was at the bedside, patient's family is all for any reasonable procedures and interventions that can alleviate symptoms and improve quality of life and some degree of life prolongation, they are typically aware of the life limiting serious irreversible illness of widely metastatic cholangiocarcinoma.  Continue current pain and non pain symptom management.  Add KI pad.   Goals of Care and Additional Recommendations: Limitations on Scope of Treatment: Avoid Hospitalization  Code Status:    Code Status Orders  (From admission, onward)         Start     Ordered   02/16/20 1058  Limited resuscitation (code)  Continuous       Question Answer Comment  In the event of cardiac or respiratory ARREST: Initiate Code Blue, Call Rapid Response Yes   In the event of cardiac or respiratory ARREST: Perform CPR No   In the event of cardiac or respiratory ARREST: Perform Intubation/Mechanical Ventilation Yes   In the event of cardiac or respiratory ARREST: Use NIPPV/BiPAp only if indicated Yes   In the event of cardiac or respiratory ARREST: Administer ACLS medications if indicated Yes   In the event of cardiac or respiratory ARREST: Perform Defibrillation or Cardioversion if indicated No   Comments as per family request.      02/16/20 1057        Code Status History    Date Active Date Inactive Code Status Order ID Comments User Context   02/14/2020 1844 02/16/2020 1057 DNR [id number]  Jonnie Finner, DO Inpatient   01/14/2020 1759 01/15/2020 2117 DNR [id number]  Jonnie Finner, DO Inpatient   09/07/2015 1122 09/08/2015 0321 Full Code  [id number]  Arne Cleveland, [place]   06/14/2015 1754 06/16/2015 1611 Full Code [id number]  Modena Jansky, MD Inpatient   10/04/2014 1412 10/05/2014 0321 Full Code [id number]  Markus Daft, MD [place] Planning Activity    Advance Directive Documentation     Most Recent Value  Type of Advance Directive Healthcare Power of Attorney  Pre-existing out of facility DNR order (yellow form or pink MOST form) --  "MOST" Form in Place? --      Prognosis:  guarded  Discharge Planning: To Be Determined  Care plan was discussed with  Patient,RN.    Thank you for allowing the Palliative Medicine Team to assist in the care of this patient.   Time In:  10 Time Out: 10.25 Total Time 25 Prolonged Time Billed  no       Greater than 50%  of this time was spent counseling and coordinating care related to the above assessment and plan.  Loistine Chance, MD  Please contact Palliative Medicine Team phone at [phone] for questions and concerns.

## 2020-02-20 NOTE — Progress Notes (Signed)
[name] [address] [facility]) Hospitalized Hospice Patient  [name] is a current hospice patient with a terminal diagnosis of intrahepatic cholangiocarcinoma per Dr. Cherie Ouch of  [facility]. Patient reports more than a week of intractable nausea and vomiting despite multiple [facility] case manager visits and adjustments to medications.  EMS was activated by family after making hospice triage RN aware. She is admitted for intractable nausea and vomiting. Per Dr. Karie Georges this is a related admission.   Visited pt at the bedside. She is still nauseated and frequently dry heaving. Per pt, plan is to go to IR to have venting PEG which will hopefully alleviate some of her discomfort.   V/S: 98.3 oral, 176/152, HR 76, RR 17, SPO2 100 % on RA Labs:  Albumin 2.4, RBC 3.64, hgb 10.5, plt 111 Diagnostics: no new IVs/PRNs: reglan 5 mg IV q6h, D5NS 40 mEq K @ 70m/hr, hydralazine 10 mg IV x 1, dilaudid 0.5 mg IV x 4, phenergan 6.25 mg IV x 3  Problem List: - Intractable n/v - likely to have venting PEG for palliative purposes, antiemetics as above - metastatic cholangiocarcinoma - no further treatment, symptom management   GOC: Pt is a partial code, planning to go to IR for venting PEG D/C planning: ongoing, likely return home with hospice Family: updated daughter RShirlean Mylaron conference call IDT: Hospice team updated  Thank you, JVenia CarbonRN, BSN, [facility] Hospital[facility]iaison

## 2020-02-20 NOTE — Progress Notes (Signed)
PROGRESS NOTE  Bonnie Peterson NAT:[id number] DOB: December 10, 1945 DOA: 02/14/2020 PCP: Lattie Haw, MD   LOS: 5 days   Brief Narrative / Interim history: 74 year old female with intrahepatic adenocarcinoma with metastatic disease, came into the ED with intractable nausea and vomiting along with abdominal pain.  This has been going on for at least 3 weeks, progressively getting worse.  She has used Reglan, scopolamine, Phenergan at home but symptoms have worsened and came to the hospital.  She has hospice services at home but not comfort care at this point.  Oncology has been evaluated patient also, and it seems like her refractory nausea and vomiting are probably secondary to gastric outlet obstruction from her metastatic cancer, she is not a candidate for chemotherapy anymore.  Palliative care also consulted.  Subjective / 24h Interval events: Frustrated that she is unable to drink anything, still nauseous at times  Assessment & Plan: Principal Problem Intractable nausea and vomiting -Possibly gastric outlet obstruction, CT scan in October showed mural thickening of the distal stomach, predominantly involving the antrum with a somewhat masslike appearance.  Thickening extends into the pyloric region as well as the duodenal bulb, possibly sequela of prior radiation therapy versus underlying mass. Not a candidate for chemotherapy per oncology -Due to persistent and progressive symptoms she underwent a repeat CT scan of the abdomen and pelvis 11/11 which showed again circumferential mural thickening and edema on the distal body of the stomach, antrum.  I have consulted gastroenterology, patient underwent an EGD on 11/12 which showed severe esophagitis, gastritis as well as a deformity in the prepyloric region of the stomach that likely caused delayed gastric emptying but there is no complete gastric obstruction.  There is no endoscopic intervention available to minimize her symptoms per GI. -I discussed  case with IR as well, it appears that her stomach is accessible for G tube placement however a J arm may be difficult to place.  This would be a two-step process.  Discussed G-tube placement again with the patient this morning and went back to the room and spent additional time and discussed with the son at bedside, she is now agreeable to placement if you would give her some relief and there is a chance that she may get some nutrition  Active Problems Widely metastatic cholangiocarcinoma -Not a candidate for further treatment.  Palliative consulted.  Patient and family have difficulties understanding gravity of her condition   Hypokalemia -Due to poor p.o. intake, continue to follow daily and supplement as needed  Essential hypertension -Still pretty hypotensive especially when she is actively having nausea and vomiting, will increase the clonidine  Type 2 diabetes mellitus -With poor p.o. intake avoid strict CBG control  Thrombocytopenia -Possibly related to malignancy, platelets overall stable, no bleeding  Goals of care -Remains partial code, appreciate palliative follow-up  Scheduled Meds: . Chlorhexidine Gluconate Cloth  6 each Topical Daily  . cloNIDine  0.3 mg Transdermal Weekly  . metoCLOPramide (REGLAN) injection  5 mg Intravenous Q6H  . polyethylene glycol  17 g Oral Daily  . senna-docusate  2 tablet Oral BID  . sodium chloride flush  10-40 mL Intracatheter Q12H   Continuous Infusions: . dextrose 5 % and 0.9 % NaCl with KCl 40 mEq/L 50 mL/hr at 02/18/20 0744   PRN Meds:.haloperidol, hydrALAZINE, HYDROmorphone (DILAUDID) injection, LORazepam, promethazine, sodium chloride flush  Diet Orders (From admission, onward)    Start     Ordered   02/19/20 0811  Diet clear liquid Room service  appropriate? Yes; Fluid consistency: Thin  Diet effective now       Question Answer Comment  Room service appropriate? Yes   Fluid consistency: Thin      02/19/20 0810          DVT  prophylaxis: SCDs Start: 02/14/20 1844     Code Status: Partial Code  Family Communication: We will call daughter later  Status is: Inpatient  Remains inpatient appropriate because:Ongoing active pain requiring inpatient pain management, IV treatments appropriate due to intensity of illness or inability to take PO and Inpatient level of care appropriate due to severity of illness   Dispo: The patient is from: Home              Anticipated d/c is to: Home              Anticipated d/c date is: 3 days              Patient currently is not medically stable to d/c.   Consultants:  Palliative care   Procedures:  EGD 11/12 Impression:               - LA Grade D reflux esophagitis with no bleeding.                            Biopsied to exclude infection. This may be a result                            of her ongoing vomiting as opposed to the cause of                            symptoms.                           - Erythematous mucosa in the gastric fundus,                            gastric body and antrum. Biopsied for H pylori.                           - Deformity in the prepyloric region of the stomach                            that likely causes delayed gastric emptying but not                            complete gastric obstruction.                           - Given these findings, management of her symptoms                            is challenging. No endoscopic interventions                            available to minimize her symptoms. Moderate Sedation:      Not Applicable - Patient had care per Anesthesia. Recommendation:           -  Return patient to hospital ward for ongoing care.                           - Clear liquid diet. Could consider trial of a very                            low residue diet.                           - Continue present medications. IV PPI BID. Add                            Carafate slurry QID.                           - Keep the head of  the bed elevated as much as                            possible.                           - Await pathology results to exclude concurrent                            infectious processes.  Microbiology  None   Antimicrobials: None     Objective: Vitals:   02/19/20 2046 02/20/20 0433 02/20/20 0755 02/20/20 1331  BP: (!) 148/74 (!) 188/78  (!) 176/152  Pulse: 78 72  76  Resp: 18 20 20 17   Temp: 98.6 F (37 C) 98.3 F (36.8 C)  98.3 F (36.8 C)  TempSrc: Oral Oral    SpO2: 100% 100%  100%  Weight:      Height:        Intake/Output Summary (Last 24 hours) at 02/20/2020 1537 Last data filed at 02/20/2020 1144 Gross per 24 hour  Intake 20 ml  Output --  Net 20 ml   Filed Weights   02/16/20 2200  Weight: 59 kg    Examination:  Constitutional: NAD Eyes: No icterus ENMT: mmm Neck: normal, supple Respiratory: Clear to auscultation without wheezing or crackles Cardiovascular: Regular rate and rhythm, no murmurs, no edema Abdomen: Mild discomfort in the epigastric area, bowel sounds positive Musculoskeletal: no clubbing / cyanosis.  Skin: No rashes Neurologic: No focal deficits  Data Reviewed: I have independently reviewed following labs and imaging studies   CBC: Recent Labs  Lab 02/16/20 0413 02/17/20 0612 02/18/20 0405 02/19/20 0529 02/20/20 0522  WBC 5.2 5.5 7.2 9.9 5.2  HGB 12.1 11.3* 11.5* 11.9* 10.5*  HCT 38.0 36.0 36.5 37.1 34.4*  MCV 93.1 94.2 92.2 92.8 94.5  PLT 111* 102* 114* 139* 762*   Basic Metabolic Panel: Recent Labs  Lab 02/14/20 1307 02/15/20 0417 02/16/20 0413 02/17/20 0612 02/18/20 0405 02/19/20 0529 02/20/20 0522  NA  --    < > 137 138 140 140 143  K  --    < > 3.4* 4.3 4.0 4.3 4.4  CL  --    < > 101 104 106 105 110  CO2  --    < > 28 28 26 24 26   GLUCOSE  --    < >  120* 170* 165* 139* 120*  BUN  --    < > 10 8 6* 7* 9  CREATININE  --    < > 0.54 0.60 0.50 0.66 0.63  CALCIUM  --    < > 8.2* 8.5* 8.8* 9.1 9.1  MG 2.2  --   2.0 1.8  --   --   --   PHOS  --   --   --  2.2*  --   --   --    < > = values in this interval not displayed.   Liver Function Tests: Recent Labs  Lab 02/15/20 0417 02/17/20 0612 02/18/20 0405 02/19/20 0529 02/20/20 0522  AST 33 38 31 31 26   ALT 17 21 21 20 17   ALKPHOS 76 75 77 77 68  BILITOT 0.7 1.0 0.6 0.8 0.5  PROT 6.1* 6.2* 6.3* 6.5 5.7*  ALBUMIN 2.8* 2.5* 2.5* 2.9* 2.4*   Coagulation Profile: No results for input(s): INR, PROTIME in the last 168 hours. HbA1C: No results for input(s): HGBA1C in the last 72 hours. CBG: Recent Labs  Lab 02/19/20 0755 02/19/20 1100 02/19/20 1654 02/20/20 0719 02/20/20 1102  GLUCAP 144* 128* 119* 125* 115*    Recent Results (from the past 240 hour(s))  Respiratory Panel by RT PCR (Flu A&B, Covid) - Nasopharyngeal Swab     Status: None   Collection Time: 02/14/20  3:45 PM   Specimen: Nasopharyngeal Swab  Result Value Ref Range Status   SARS Coronavirus 2 by RT PCR NEGATIVE NEGATIVE Final    Comment: (NOTE) SARS-CoV-2 target nucleic acids are NOT DETECTED.  The SARS-CoV-2 RNA is generally detectable in upper respiratoy specimens during the acute phase of infection. The lowest concentration of SARS-CoV-2 viral copies this assay can detect is 131 copies/mL. A negative result does not preclude SARS-Cov-2 infection and should not be used as the sole basis for treatment or other patient management decisions. A negative result may occur with  improper specimen collection/handling, submission of specimen other than nasopharyngeal swab, presence of viral mutation(s) within the areas targeted by this assay, and inadequate number of viral copies (<131 copies/mL). A negative result must be combined with clinical observations, patient history, and epidemiological information. The expected result is Negative.  Fact Sheet for Patients:  PinkCheek.be  Fact Sheet for Healthcare Providers:    GravelBags.it  This test is no t yet approved or cleared by the [place] FDA and  has been authorized for detection and/or diagnosis of SARS-CoV-2 by FDA under an Emergency Use Authorization (EUA). This EUA will remain  in effect (meaning this test can be used) for the duration of the COVID-19 declaration under Section 564(b)(1) of the Act, 21 U.S.C. section 360bbb-3(b)(1), unless the authorization is terminated or revoked sooner.     Influenza A by PCR NEGATIVE NEGATIVE Final   Influenza B by PCR NEGATIVE NEGATIVE Final    Comment: (NOTE) The Xpert Xpress SARS-CoV-2/FLU/RSV assay is intended as an aid in  the diagnosis of influenza from Nasopharyngeal swab specimens and  should not be used as a sole basis for treatment. Nasal washings and  aspirates are unacceptable for Xpert Xpress SARS-CoV-2/FLU/RSV  testing.  Fact Sheet for Patients: PinkCheek.be  Fact Sheet for Healthcare Providers: GravelBags.it  This test is not yet approved or cleared by the [place] FDA and  has been authorized for detection and/or diagnosis of SARS-CoV-2 by  FDA under an Emergency Use Authorization (EUA). This EUA will remain  in effect (meaning  this test can be used) for the duration of the  Covid-19 declaration under Section 564(b)(1) of the Act, 21  U.S.C. section 360bbb-3(b)(1), unless the authorization is  terminated or revoked. Performed at [facility], [place] [address], [place], [place] [address]     Time spent: 40 minutes, more than 50% involved in counseling/discussion with the patient as well as patient's family, discussing with IR  Radiology Studies: No results found.  Time spent: 35 minutes in 2 separate visit, more than 50% at bedside to explain in detail to the best of my abilities procedure, next steps moving forward and following IR consult.  Marzetta Board, MD,  PhD [facility]  Between 7 am - 7 pm I am available, please contact me via Amion or Securechat  Between 7 pm - 7 am I am not available, please contact night coverage MD/APP via Amion

## 2020-02-21 ENCOUNTER — Other Ambulatory Visit: Payer: Self-pay

## 2020-02-21 ENCOUNTER — Encounter (HOSPITAL_COMMUNITY): Payer: Self-pay | Admitting: Gastroenterology

## 2020-02-21 ENCOUNTER — Other Ambulatory Visit: Payer: Self-pay | Admitting: Radiology

## 2020-02-21 ENCOUNTER — Inpatient Hospital Stay (HOSPITAL_COMMUNITY)

## 2020-02-21 DIAGNOSIS — Z515 Encounter for palliative care: Secondary | ICD-10-CM

## 2020-02-21 DIAGNOSIS — Z7189 Other specified counseling: Secondary | ICD-10-CM | POA: Diagnosis not present

## 2020-02-21 DIAGNOSIS — R112 Nausea with vomiting, unspecified: Secondary | ICD-10-CM | POA: Diagnosis not present

## 2020-02-21 DIAGNOSIS — C221 Intrahepatic bile duct carcinoma: Secondary | ICD-10-CM | POA: Diagnosis not present

## 2020-02-21 DIAGNOSIS — R52 Pain, unspecified: Secondary | ICD-10-CM | POA: Diagnosis not present

## 2020-02-21 DIAGNOSIS — R531 Weakness: Secondary | ICD-10-CM

## 2020-02-21 HISTORY — PX: IR GASTR TUBE CONVERT GASTR-JEJ PER W/FL MOD SED: IMG2332

## 2020-02-21 HISTORY — PX: IR GASTROSTOMY TUBE MOD SED: IMG625

## 2020-02-21 LAB — GLUCOSE, CAPILLARY
Glucose-Capillary: 119 mg/dL — ABNORMAL HIGH (ref 70–99)
Glucose-Capillary: 121 mg/dL — ABNORMAL HIGH (ref 70–99)
Glucose-Capillary: 93 mg/dL (ref 70–99)
Glucose-Capillary: 105 mg/dL — ABNORMAL HIGH (ref 70–99)

## 2020-02-21 MED ORDER — IOHEXOL 300 MG/ML  SOLN
50.0000 mL | Freq: Once | INTRAMUSCULAR | Status: AC | PRN
  Administered 2020-02-21: 50 mL

## 2020-02-21 MED ORDER — LIDOCAINE HCL 1 % IJ SOLN
INTRAMUSCULAR | Status: AC
  Filled 2020-02-21: qty 20

## 2020-02-21 MED ORDER — MIDAZOLAM HCL 2 MG/2ML IJ SOLN
INTRAMUSCULAR | Status: AC | PRN
  Administered 2020-02-21 (×2): 1 mg via INTRAVENOUS

## 2020-02-21 MED ORDER — FENTANYL CITRATE (PF) 100 MCG/2ML IJ SOLN
INTRAMUSCULAR | Status: AC
  Filled 2020-02-21: qty 2

## 2020-02-21 MED ORDER — IOHEXOL 300 MG/ML  SOLN
50.0000 mL | Freq: Once | INTRAMUSCULAR | Status: AC | PRN
  Administered 2020-02-21: 20 mL

## 2020-02-21 MED ORDER — CEFAZOLIN SODIUM-DEXTROSE 2-4 GM/100ML-% IV SOLN: 2.0000 g | Freq: Once | INTRAVENOUS | Status: DC

## 2020-02-21 MED ORDER — LIDOCAINE HCL (PF) 1 % IJ SOLN
INTRAMUSCULAR | Status: AC | PRN
  Administered 2020-02-21: 10 mL via INTRADERMAL

## 2020-02-21 MED ORDER — CEFAZOLIN SODIUM-DEXTROSE 2-4 GM/100ML-% IV SOLN
INTRAVENOUS | Status: AC
  Filled 2020-02-21: qty 100

## 2020-02-21 MED ORDER — PROMETHAZINE HCL 25 MG/ML IJ SOLN
INTRAMUSCULAR | Status: AC
  Administered 2020-02-21: 6.25 mg via INTRAVENOUS
  Filled 2020-02-21: qty 1

## 2020-02-21 MED ORDER — FENTANYL CITRATE (PF) 100 MCG/2ML IJ SOLN
INTRAMUSCULAR | Status: AC | PRN
Start: 2020-02-21 — End: 2020-02-21
  Administered 2020-02-21: 50 ug via INTRAVENOUS

## 2020-02-21 MED ORDER — MIDAZOLAM HCL 2 MG/2ML IJ SOLN
INTRAMUSCULAR | Status: AC
  Filled 2020-02-21: qty 2

## 2020-02-21 NOTE — Consult Note (Addendum)
Chief Complaint: Intractable nausea and vomiting. Request is for GJ tube placement.   Referring Physician(s): Dr. Wardell Heath  Supervising Physician: Sandi Mariscal  Patient Status: [facility] - In-pt  History of Present Illness: Bonnie Peterson is a 74 y.o. 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 Admitted for persistent and worsening intractable nausea and vomiting X 3 weeks. Team is concerned for an gastric outlet obstruction. Team is looking for a gastrostomy tube for decompression and jejunostomy for feeds.   Past Medical History:  Diagnosis Date  . Anxiety   . Benign positional vertigo   . Cancer ([place]) 02/09/15   intrahepatic cholangiocarcinoma  . COPD (chronic obstructive pulmonary disease) ([place])   . Depression   . Diabetes mellitus without complication ([place])   . Early cataracts, bilateral 10/13   Optho, Dr Gershon Crane  . Echocardiogram findings abnormal, without diagnosis 10/10   10/10: mild pulm HTN, EF 60-65%, mild LVH, moderate aortic regurg  . GERD (gastroesophageal reflux disease)    I have "acid reflux"  . Gout   . HLD (hyperlipidemia)   . HTN, goal below 130/80   . Irritable bowel syndrome   . Obesity, Class III, BMI 40-49.9 (morbid obesity) ([place])   . Obstructive sleep apnea    wears cpap  . Osteoarthritis (arthritis due to wear and tear of joints)    also gout  . Restrictive lung disease     Past Surgical History:  Procedure Laterality Date  . ABDOMINAL HYSTERECTOMY    . COLONOSCOPY WITH PROPOFOL N/A 08/12/2019   Procedure: COLONOSCOPY WITH PROPOFOL;  Surgeon: [name], MD;  Location: [facility] ENDOSCOPY;  Service: Endoscopy;  Laterality: N/A;  . ESOPHAGOGASTRODUODENOSCOPY N/A 06/15/2015   Procedure: ESOPHAGOGASTRODUODENOSCOPY (EGD);  Surgeon: Gatha Mayer, MD;  Location: [facility];  Service: Endoscopy;  Laterality: N/A;  . IR IMAGING GUIDED PORT INSERTION  06/15/2019  . IR REMOVAL TUN  ACCESS W/ PORT W/O FL MOD SED  11/11/2016  . LIVER BIOPSY    . OPEN PARTIAL HEPATECTOMY  Left 02/09/15  . PARTIAL HYSTERECTOMY    . ROTATOR CUFF REPAIR       L rotator cuff repair 11/07-[place] - 12/7/200  . TONSILLECTOMY      Allergies: Other and Propofol  Medications: Prior to Admission medications   Medication Sig Start Date End Date Taking? Authorizing Provider  albuterol (PROAIR HFA) 108 (90 Base) MCG/ACT inhaler Inhale 1-2 puffs into the lungs every 6 (six) hours as needed for wheezing or shortness of breath. 07/23/19  Yes Parrett, Tammy S, NP  alendronate (FOSAMAX) 70 MG tablet TAKE 1 TABLET  ONCE A WEEK. Patient taking differently: Take 70 mg by mouth every Sunday. TAKE 1 TABLET  ONCE A WEEK. 03/02/19  Yes Lattie Haw, MD  amLODipine (NORVASC) 10 MG tablet TAKE 1 TABLET EVERY DAY Patient taking differently: Take 10 mg by mouth daily.  06/03/19  Yes Lattie Haw, MD  cetirizine (ZYRTEC) 10 MG tablet Take 10 mg by mouth daily as needed for allergies.   Yes [provider]  cholecalciferol (VITAMIN D) 1000 units tablet Take 1 tablet (1,000 Units total) by mouth daily. 01/03/17  Yes Bufford Lope, DO  dicyclomine (BENTYL) 20 MG tablet Take 1 tablet (20 mg total) by mouth 2 (two) times daily as needed for spasms (abdominal pain). 01/11/20  Yes Gareth Morgan, MD  famotidine (PEPCID) 20 MG tablet Take 1 tablet (20 mg total) by mouth 2 (two)  times daily. Patient taking differently: Take 20 mg by mouth 2 (two) times daily as needed for heartburn.  06/26/19  Yes Lattie Haw, MD  fish oil-omega-3 fatty acids 1000 MG capsule Take 1 g by mouth once a week.    Yes [provider]  fluticasone (FLONASE) 50 MCG/ACT nasal spray Place 2 sprays into both nostrils daily as needed for rhinitis. 04/17/16  Yes Haney, Alyssa A, MD  haloperidol (HALDOL) 0.5 MG tablet Take 1 tablet (0.5 mg total) by mouth every 6 (six) hours as needed for agitation (or nausea). 01/15/20  Yes Micheline Rough, MD    Hyprom-Naphaz-Polysorb-Zn Sulf (CLEAR EYES COMPLETE OP) Place 1 drop into both eyes daily as needed (dry eyes).    Yes [provider]  lidocaine-prilocaine (EMLA) cream Apply 1 application topically every 14 (fourteen) days. Every 14 days as needed Patient taking differently: Apply 1 application topically as needed (port access).  06/17/19  Yes Truitt Merle, MD  lisinopril (ZESTRIL) 40 MG tablet Take 1 tablet (40 mg total) by mouth daily. 03/02/19  Yes Lattie Haw, MD  LORazepam (ATIVAN) 1 MG tablet Take 1 mg by mouth daily as needed for anxiety.  01/26/20  Yes [provider]  metoCLOPramide (REGLAN) 10 MG tablet Take 10 mg by mouth 4 (four) times daily. 01/25/20  Yes [provider]  mirtazapine (REMERON SOL-TAB) 15 MG disintegrating tablet Take 1 tablet (15 mg total) by mouth at bedtime. 01/06/20 01/05/21 Yes Truitt Merle, MD  Multiple Vitamin (MULTIVITAMIN WITH MINERALS) TABS tablet Take 1 tablet by mouth daily.   Yes [provider]  ondansetron (ZOFRAN ODT) 8 MG disintegrating tablet Take 1 tablet (8 mg total) by mouth every 8 (eight) hours as needed for nausea or vomiting. 01/12/20  Yes Tanner, Lyndon Code., PA-C  Oxycodone HCl 10 MG TABS Take 10 mg by mouth 4 (four) times daily as needed (pain).  01/26/20  Yes [provider]  Probiotic Product (PROBIOTIC PO) Take 1 capsule by mouth daily.   Yes [provider]  promethazine (PHENERGAN) 25 MG tablet Take 25 mg by mouth every 6 (six) hours as needed for nausea or vomiting.  02/03/20  Yes [provider]  scopolamine (TRANSDERM-SCOP) 1 MG/3DAYS Place 1 patch onto the skin every 3 (three) days.  02/06/20  Yes [provider]  VITAMIN E PO Take 1 tablet by mouth See admin instructions. 2-3 times a month   Yes [provider]  atenolol (TENORMIN) 25 MG tablet Take 1 tablet (25 mg total) by mouth daily. Patient not taking: Reported on 02/14/2020 01/08/19   Lattie Haw, MD   diclofenac sodium (VOLTAREN) 1 % GEL Apply 2 g topically 4 (four) times daily as needed. Patient not taking: Reported on 02/14/2020 01/26/18   Bufford Lope, DO  hydrochlorothiazide (HYDRODIURIL) 25 MG tablet Take 1 tablet (25 mg total) by mouth daily. Patient not taking: Reported on 02/14/2020 03/02/19   Lattie Haw, MD  loratadine (CLARITIN) 10 MG tablet Take 1 tablet (10 mg total) by mouth daily as needed for allergies. Patient not taking: Reported on 02/14/2020 09/23/12   Waldemar Dickens, MD  LORazepam (ATIVAN) 0.5 MG tablet Take 1 tablet (0.5 mg total) by mouth every 4 (four) hours as needed for anxiety (or nausea that is not controlled by other medications). Patient not taking: Reported on 02/14/2020 01/15/20   Micheline Rough, MD  metFORMIN (GLUCOPHAGE) 500 MG tablet TAKE 2 TABLETS TWICE DAILY WITH MEALS Patient not taking: Reported on 02/14/2020  03/02/19   Lattie Haw, MD  metoCLOPramide (REGLAN) 5 MG tablet Take 1 tablet (5 mg total) by mouth 4 (four) times daily -  before meals and at bedtime. Patient not taking: Reported on 02/14/2020 01/15/20   Micheline Rough, MD  Na Sulfate-K Sulfate-Mg Sulf (SUPREP BOWEL PREP KIT) 17.5-3.13-1.6 GM/177ML SOLN Take 1 kit by mouth as directed. Patient not taking: Reported on 01/15/2020 07/21/19   [name], MD  oxyCODONE (OXY IR/ROXICODONE) 5 MG immediate release tablet Take 0.5-1 tablets (2.5-5 mg total) by mouth every 8 (eight) hours as needed for severe pain. Patient not taking: Reported on 02/14/2020 01/06/20   Truitt Merle, MD  potassium chloride (KLOR-CON) 10 MEQ tablet Take 1 tablet (10 mEq total) by mouth daily. Patient not taking: Reported on 02/14/2020 01/10/20   Truitt Merle, MD  RIVAROXABAN Alveda Reasons) VTE STARTER PACK (15 & 20 MG TABLETS) Follow package directions: Take one 47m tablet by mouth twice a day. On day 22, switch to one 236mtablet once a day. Take with food. Patient not taking: Reported on 02/14/2020 08/26/19   FeTruitt MerleMD  traMADol  (ULTRAM) 50 MG tablet Take 1 tablet (50 mg total) by mouth every 6 (six) hours as needed. Patient not taking: Reported on 01/15/2020 12/20/19   FeTruitt MerleMD     Family History  Problem Relation Age of Onset  . Breast cancer Mother   . Hypertension Mother   . Coronary artery disease Mother   . Diabetes type II Mother   . Rheum arthritis Mother   . Diabetes type II Sister   . Pancreatic cancer Sister     Social History   Socioeconomic History  . Marital status: Divorced    Spouse name: Not on file  . Number of children: 4  . Years of education: Not on file  . Highest education level: Not on file  Occupational History  . Occupation: unemployed  Tobacco Use  . Smoking status: Former Smoker    Packs/day: 0.50    Years: 49.00    Pack years: 24.50    Types: Cigarettes    Quit date: 04/13/2018    Years since quitting: 1.8  . Smokeless tobacco: Never Used  Vaping Use  . Vaping Use: Never used  Substance and Sexual Activity  . Alcohol use: Yes    Alcohol/week: 0.0 standard drinks    Comment: occasionally  . Drug use: Yes    Types: Marijuana  . Sexual activity: Never  Other Topics Concern  . 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 Difficulty of Paying Living Expenses: Not on file  Food Insecurity:   . Worried About RuCharity fundraisern the Last Year: Not on file  . Ran Out of Food in the Last Year: Not on file  Transportation Needs:   . Lack of Transportation (Medical): Not on file  . Lack of Transportation (Non-Medical): Not on file  Physical Activity:   . Days of Exercise per Week: Not on file  . Minutes of  Exercise per Session: Not on file  Stress:   .  Feeling of Stress : Not on file  Social Connections:   . Frequency of Communication with Friends and Family: Not on file  . Frequency of Social Gatherings with Friends and Family: Not on file  . Attends Religious Services: Not on file  . Active Member of Clubs or Organizations: Not on file  . Attends Archivist Meetings: Not on file  . Marital Status: Not on file     Review of Systems: A 12 point ROS discussed and pertinent positives are indicated in the HPI above.  All other systems are negative.  Review of Systems  Constitutional: Negative for fatigue and fever.  HENT: Negative for congestion.   Respiratory: Negative for cough and shortness of breath.   Gastrointestinal: Negative for abdominal pain, diarrhea, nausea and vomiting.    Vital Signs: BP (!) 177/89 (BP Location: Left Arm)   Pulse 90   Temp 98.4 F (36.9 C)   Resp 16   Ht _0  (1.6 m)   Wt 130 lb 1.1 oz (59 kg)   SpO2 99%   BMI 23.04 kg/m   Physical Exam Vitals and nursing note reviewed.  Constitutional:      Appearance: She is well-developed.  HENT:     Head: Normocephalic and atraumatic.  Eyes:     Conjunctiva/sclera: Conjunctivae normal.  Pulmonary:     Effort: Pulmonary effort is normal.  Musculoskeletal:     Cervical back: Normal range of motion.  Neurological:     Mental Status: She is alert and oriented to person, place, and time.     Imaging: CT CHEST W CONTRAST  Result Date: 02/17/2020 CLINICAL DATA:  Intrahepatic cholangiocarcinoma. Restaging examination. EXAM: CT CHEST, ABDOMEN, AND PELVIS WITH CONTRAST TECHNIQUE: Multidetector CT imaging of the chest, abdomen and pelvis was performed following the standard protocol during bolus administration of intravenous contrast. CONTRAST:  157m OMNIPAQUE IOHEXOL 300 MG/ML  SOLN COMPARISON:  CT abdomen pelvis 01/14/2020 and 01/11/2020, CT chest 07/29/2017 FINDINGS: CT CHEST FINDINGS  Cardiovascular: Moderate multi-vessel coronary artery calcification. Global cardiac size within normal limits. Lipomatous hypertrophy of the intra-atrial septum noted. Mild thinning of the left ventricular apex suggests remote myocardial infarction or chronic ischemia in this location. No pericardial effusion. The central pulmonary arteries are enlarged suggesting changes of pulmonary arterial hypertension. Extensive atherosclerotic calcification noted within the thoracic aorta. The thoracic aorta is mildly dilated measuring 4.0 cm in greatest dimension within the ascending segment and 3.2 cm in greatest dimension within the arch. The thoracic aorta demonstrates a more normal caliber at the level of the left atrium measuring 2.5 cm in dimension. Right internal jugular chest port catheter tip noted within the superior cavoatrial junction. Mediastinum/Nodes: Thyroid unremarkable. No pathologic thoracic adenopathy. Mild circumferential thickening of the distal esophageal wall may reflect changes of mild esophagitis, as can be seen with reflux esophagitis. The esophagus is otherwise unremarkable. Lungs/Pleura: Mild emphysema again noted. Bibasilar atelectasis is noted. 5 mm subpleural pulmonary nodule within the right middle lobe, axial image # 90/4, is stable since remote prior examination and is safely considered benign given its stability over time. No new focal pulmonary nodule or infiltrate. No pneumothorax or pleural effusion. Central airways are widely patent. Musculoskeletal: Osseous structures are diffusely osteopenic. Advanced degenerative changes are seen within the thoracic spine. Flowing osteophytes throughout the thoracic spine are in keeping with changes of diffuse idiopathic skeletal hyperostosis. No focal lytic or blastic bone lesions are identified. CT ABDOMEN PELVIS FINDINGS Hepatobiliary: Surgical changes of left hepatectomy are  again identified. Multiple enhancing masses are again seen scattered  throughout the residual right hepatic lobe in keeping with recurrent cholangiocarcinoma. Exact measurement is difficult as many of these variably enhancing masses form irregular conglomerates, however, these appear grossly stable since prior examination. By example: Conglomerate within the superior right hepatic lobe, axial image # 55/2, measures 6.1 x 6.5 cm (previously measuring 6.0 x 7.6 cm). Conglomerate within the inferior right hepatic lobe, axial image # 74/2, measures 4.5 x 7.0 cm (previously measuring 4.3 x 7.4 cm). Focal enhancing mass within the right hepatic lobe, axial image # 70/2, measures 2.6 x 3.2 cm (previously measuring 2.5 x 2.8 cm). No intrahepatic biliary ductal dilation. Status post cholecystectomy. The extrahepatic bile duct is not dilated. Thrombosis of the portal vein is again identified Pancreas: Unremarkable Spleen: Unremarkable Adrenals/Urinary Tract: Adrenal glands are unremarkable. Kidneys are normal, without renal calculi, focal lesion, or hydronephrosis. Bladder is unremarkable. Stomach/Bowel: There is circumferential mural thickening and edema involving the distal body of the stomach and antrum, similar to that noted a on prior examination, possibly reflecting changes of portal gastropathy given chronic occlusion of the portal vein. Alternatively, this can be seen in caustic or infectious gastritis. No evidence of obstruction or perforation. Multiple pedunculated lipomas are again seen within the colon measuring 14 mm within the distal transverse colon and 3.0 cm within the sigmoid colon. The small and large bowel are otherwise unremarkable. The appendix is unremarkable. Trace ascites is present, similar to prior examination. No free intraperitoneal gas. Vascular/Lymphatic: Extensive aortoiliac atherosclerotic calcification. No aortic aneurysm. Shotty, enhancing left periaortic lymphadenopathy is stable, measuring 11 mm on axial image # 69/2. No frankly pathologic adenopathy within  the abdomen and pelvis. Reproductive: Status post hysterectomy. No adnexal masses. Other: Rectum unremarkable Musculoskeletal: Advanced degenerative changes are seen within the lumbar spine. No lytic or blastic bone lesions are seen. IMPRESSION: Stable disease. Status post left hepatectomy with multiple recurrent conglomerate enhancing masses within the residual right hepatic lobe. No intrahepatic biliary ductal dilation. Malignant masses replace roughly 50% of the liver parenchyma. Chronic thrombosis of the portal vein. Stable thickening and edema of the distal body of the stomach and gastric antrum possibly representing changes of portal gastropathy. Note that caustic or infectious gastritis could appear similarly and correlation with endoscopy may be helpful for definitive evaluation. No evidence of pulmonary metastatic disease. Moderate multi-vessel coronary artery calcification. Mild dilation of the thoracic aorta. Further follow-up is dependent upon the patient's prognosis related to their underlying malignancy. This can be reassessed on subsequent surveillance imaging for the patient's primary malignancy. Aortic Atherosclerosis (ICD10-I70.0) and Emphysema (ICD10-J43.9). Aortic aneurysm NOS (ICD10-I71.9). Electronically Signed   By: Fidela Salisbury MD   On: 02/17/2020 20:41   CT ABDOMEN PELVIS W CONTRAST  Result Date: 02/17/2020 CLINICAL DATA:  Intrahepatic cholangiocarcinoma. Restaging examination. EXAM: CT CHEST, ABDOMEN, AND PELVIS WITH CONTRAST TECHNIQUE: Multidetector CT imaging of the chest, abdomen and pelvis was performed following the standard protocol during bolus administration of intravenous contrast. CONTRAST:  117m OMNIPAQUE IOHEXOL 300 MG/ML  SOLN COMPARISON:  CT abdomen pelvis 01/14/2020 and 01/11/2020, CT chest 07/29/2017 FINDINGS: CT CHEST FINDINGS Cardiovascular: Moderate multi-vessel coronary artery calcification. Global cardiac size within normal limits. Lipomatous hypertrophy of the  intra-atrial septum noted. Mild thinning of the left ventricular apex suggests remote myocardial infarction or chronic ischemia in this location. No pericardial effusion. The central pulmonary arteries are enlarged suggesting changes of pulmonary arterial hypertension. Extensive atherosclerotic calcification noted within the thoracic aorta. The thoracic  aorta is mildly dilated measuring 4.0 cm in greatest dimension within the ascending segment and 3.2 cm in greatest dimension within the arch. The thoracic aorta demonstrates a more normal caliber at the level of the left atrium measuring 2.5 cm in dimension. Right internal jugular chest port catheter tip noted within the superior cavoatrial junction. Mediastinum/Nodes: Thyroid unremarkable. No pathologic thoracic adenopathy. Mild circumferential thickening of the distal esophageal wall may reflect changes of mild esophagitis, as can be seen with reflux esophagitis. The esophagus is otherwise unremarkable. Lungs/Pleura: Mild emphysema again noted. Bibasilar atelectasis is noted. 5 mm subpleural pulmonary nodule within the right middle lobe, axial image # 90/4, is stable since remote prior examination and is safely considered benign given its stability over time. No new focal pulmonary nodule or infiltrate. No pneumothorax or pleural effusion. Central airways are widely patent. Musculoskeletal: Osseous structures are diffusely osteopenic. Advanced degenerative changes are seen within the thoracic spine. Flowing osteophytes throughout the thoracic spine are in keeping with changes of diffuse idiopathic skeletal hyperostosis. No focal lytic or blastic bone lesions are identified. CT ABDOMEN PELVIS FINDINGS Hepatobiliary: Surgical changes of left hepatectomy are again identified. Multiple enhancing masses are again seen scattered throughout the residual right hepatic lobe in keeping with recurrent cholangiocarcinoma. Exact measurement is difficult as many of these  variably enhancing masses form irregular conglomerates, however, these appear grossly stable since prior examination. By example: Conglomerate within the superior right hepatic lobe, axial image # 55/2, measures 6.1 x 6.5 cm (previously measuring 6.0 x 7.6 cm). Conglomerate within the inferior right hepatic lobe, axial image # 74/2, measures 4.5 x 7.0 cm (previously measuring 4.3 x 7.4 cm). Focal enhancing mass within the right hepatic lobe, axial image # 70/2, measures 2.6 x 3.2 cm (previously measuring 2.5 x 2.8 cm). No intrahepatic biliary ductal dilation. Status post cholecystectomy. The extrahepatic bile duct is not dilated. Thrombosis of the portal vein is again identified Pancreas: Unremarkable Spleen: Unremarkable Adrenals/Urinary Tract: Adrenal glands are unremarkable. Kidneys are normal, without renal calculi, focal lesion, or hydronephrosis. Bladder is unremarkable. Stomach/Bowel: There is circumferential mural thickening and edema involving the distal body of the stomach and antrum, similar to that noted a on prior examination, possibly reflecting changes of portal gastropathy given chronic occlusion of the portal vein. Alternatively, this can be seen in caustic or infectious gastritis. No evidence of obstruction or perforation. Multiple pedunculated lipomas are again seen within the colon measuring 14 mm within the distal transverse colon and 3.0 cm within the sigmoid colon. The small and large bowel are otherwise unremarkable. The appendix is unremarkable. Trace ascites is present, similar to prior examination. No free intraperitoneal gas. Vascular/Lymphatic: Extensive aortoiliac atherosclerotic calcification. No aortic aneurysm. Shotty, enhancing left periaortic lymphadenopathy is stable, measuring 11 mm on axial image # 69/2. No frankly pathologic adenopathy within the abdomen and pelvis. Reproductive: Status post hysterectomy. No adnexal masses. Other: Rectum unremarkable Musculoskeletal: Advanced  degenerative changes are seen within the lumbar spine. No lytic or blastic bone lesions are seen. IMPRESSION: Stable disease. Status post left hepatectomy with multiple recurrent conglomerate enhancing masses within the residual right hepatic lobe. No intrahepatic biliary ductal dilation. Malignant masses replace roughly 50% of the liver parenchyma. Chronic thrombosis of the portal vein. Stable thickening and edema of the distal body of the stomach and gastric antrum possibly representing changes of portal gastropathy. Note that caustic or infectious gastritis could appear similarly and correlation with endoscopy may be helpful for definitive evaluation. No evidence of pulmonary metastatic  disease. Moderate multi-vessel coronary artery calcification. Mild dilation of the thoracic aorta. Further follow-up is dependent upon the patient's prognosis related to their underlying malignancy. This can be reassessed on subsequent surveillance imaging for the patient's primary malignancy. Aortic Atherosclerosis (ICD10-I70.0) and Emphysema (ICD10-J43.9). Aortic aneurysm NOS (ICD10-I71.9). Electronically Signed   By: Fidela Salisbury MD   On: 02/17/2020 20:41   DG Abd Portable 1V  Result Date: 02/15/2020 CLINICAL DATA:  Cholangiocarcinoma, nausea, vomiting EXAM: PORTABLE ABDOMEN - 1 VIEW COMPARISON:  Portable exam 1138 hours compared to 01/14/2020 FINDINGS: Nonobstructive bowel gas pattern. Paucity of bowel gas in the upper abdomen. No bowel wall thickening or dilatation. Degenerative disc disease changes lumbar spine. Surgical clips in upper to mid abdomen. IMPRESSION: No acute abnormalities. Electronically Signed   By: Lavonia Dana M.D.   On: 02/15/2020 12:19    Labs:  CBC: Recent Labs    02/17/20 0612 02/18/20 0405 02/19/20 0529 02/20/20 0522  WBC 5.5 7.2 9.9 5.2  HGB 11.3* 11.5* 11.9* 10.5*  HCT 36.0 36.5 37.1 34.4*  PLT 102* 114* 139* 111*    COAGS: Recent Labs    06/15/19 1323 01/11/20 0605  INR  1.0 1.1    BMP: Recent Labs    09/30/19 2142 09/30/19 2142 11/24/19 1231 11/24/19 1231 01/06/20 1328 01/06/20 1328 01/11/20 0549 01/14/20 1100 02/17/20 0612 02/18/20 0405 02/19/20 0529 02/20/20 0522  NA 138   < > 139   < > 138   < > 139   < > 138 140 140 143  K 4.9   < > 3.7   < > 3.3*   < > 3.7   < > 4.3 4.0 4.3 4.4  CL 106   < > 106   < > 100   < > 100   < > 104 106 105 110  CO2 16*   < > 25   < > 29   < > 21*   < > _0 GLUCOSE 124*   < > 138*   < > 108*   < > 165*   < > 170* 165* 139* 120*  BUN 7*   < > 10   < > 7*   < > 11   < > 8 6* 7* 9  CALCIUM 9.0   < > 9.8   < > 9.0   < > 9.5   < > 8.5* 8.8* 9.1 9.1  CREATININE 0.91   < > 0.75   < > 0.65  --  0.70   < > 0.60 0.50 0.66 0.63  GFRNONAA >60   < > >60   < > >60  --  >60   < > >60 >60 >60 >60  GFRAA >60  --  >60  --  >60  --  >60  --   --   --   --   --    < > = values in this interval not displayed.    LIVER FUNCTION TESTS: Recent Labs    02/17/20 0612 02/18/20 0405 02/19/20 0529 02/20/20 0522  BILITOT 1.0 0.6 0.8 0.5  AST 38 _1 ALT _2 ALKPHOS 75 77 77 68  PROT 6.2* 6.3* 6.5 5.7*  ALBUMIN 2.5* 2.5* 2.9* 2.4*    Assessment and Plan:  74 y.o. female inpatient. 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 Admitted for persistent and worsening intractable nausea and vomiting X  3 weeks. Team is concerned for an gastric outlet obstruction. Team is looking for a gastrsotomy tube for decompression and Jejunostomy for feeds.   CT abd pelvis from 11.11.21 shows the stomach is in an accessible positions for percutaneous access. All labs and medications are within acceptable parameters. NKDA.[name] Patient has been made NPO. COVID - on 11.8.21  Risks and benefits image guided gastrostomy jejunostomy tube placement was discussed with the patient including, but not limited to the need for a barium enema during the procedure, bleeding,  infection, peritonitis and/or damage to adjacent structures.  All of the patient's questions were answered, patient is agreeable to proceed.  Consent signed and in chart.  Thank you for this interesting consult.  I greatly enjoyed meeting PAT ELICKER and look forward to participating in their care.  A copy of this report was sent to the requesting provider on this date.  Electronically Signed: Jacqualine Mau, NP 02/21/2020, 8:41 AM   I spent a total of 40 Minutes    in face to face in clinical consultation, greater than 50% of which was counseling/coordinating care for GJ tube placement.

## 2020-02-21 NOTE — Progress Notes (Signed)
Daily Progress Note   Patient Name: Bonnie Peterson       Date: 02/17/2020 DOB: 30-Aug-1945  Age: 74 y.o. MRN#: [id number] Attending Physician: Bonnie Griffins, MD Primary Care Physician: Bonnie Haw, MD Admit Date: 02/14/2020  Reason for Consultation/Follow-up: Establishing goals of care  Subjective: Patient awake alert, resting in bed, daughter at bedside, patient awaiting procedure today, IR was consulted, patient being considered for gastrostomy tube for decompression and J tube for feeding purposes.    Length of Stay: 2  Current Medications: Scheduled Meds:  . amLODipine  10 mg Oral Daily  . Chlorhexidine Gluconate Cloth  6 each Topical Daily  . lisinopril  40 mg Oral Daily  . metoCLOPramide (REGLAN) injection  5 mg Intravenous Q6H  . mirtazapine  15 mg Oral QHS  . polyethylene glycol  17 g Oral Daily  . senna-docusate  2 tablet Oral BID    Continuous Infusions: . dextrose 5 % and 0.9 % NaCl with KCl 40 mEq/L 50 mL/hr at 02/17/20 0200    PRN Meds: haloperidol, HYDROmorphone (DILAUDID) injection, LORazepam, promethazine, sodium chloride flush  Physical Exam         Awake alert  resting in bed Regular work of breathing S1-S2 No significant peripheral edema Nonfocal Abdomen soft nondistended  Vital Signs: BP (!) 200/82 (BP Location: Left Arm)   Pulse 93   Temp 98 F (36.7 C) (Oral)   Resp 18   Ht 5' 3"  (1.6 m)   Wt 59 kg   SpO2 98%   BMI 23.04 kg/m  SpO2: SpO2: 98 % O2 Device: O2 Device: Room Air O2 Flow Rate:    Intake/output summary:   Intake/Output Summary (Last 24 hours) at 02/17/2020 1121 Last data filed at 02/17/2020 0200 Gross per 24 hour  Intake 1444.32 ml  Output --  Net 1444.32 ml   LBM: Last BM Date:  ("two weeks ago") Baseline Weight:  Weight: 59 kg Most recent weight: Weight: 59 kg       Palliative Assessment/Data:   Palliative performance scale 40%  Patient Active Problem List   Diagnosis Date Noted  . Cholangiocarcinoma ([place]) 02/15/2020  . Intractable nausea and vomiting 01/14/2020  . Rectal bleeding   . Depression 07/19/2019  . Goals of care, counseling/discussion 06/11/2019  . Blood in stool 06/11/2019  .  Acute pain of left lower extremity 01/05/2019  . Gastroesophageal reflux disease without esophagitis 06/03/2018  . Lung nodules 05/04/2018  . Type 2 diabetes mellitus without complication, without long-term current use of insulin ([place]) 01/05/2017  . Healthcare maintenance 01/24/2016  . Back pain 11/23/2015  . Port catheter in place 11/10/2015  . 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 History of resection of liver 02/10/2015  . 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 Inadequate sleep hygiene 08/22/2014  . 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 IBS (irritable bowel syndrome) 03/22/2013  . Liver mass, left lobe 02/24/2013  . Osteoporosis 10/15/2012  . Vitamin D deficiency 12/23/2011  . Cigarette smoker 10/12/2010  . Chronic obstructive pulmonary disease ([place]) 06/14/2010  . Obstructive sleep apnea 04/27/2009  . OBESITY, UNSPECIFIED 04/26/2009  . Hyperlipidemia 11/25/2007  . GOUT NOS 12/24/2006  . Recurrent major depressive disorder ([place]) 06/05/2006  . Anxiety 06/05/2006  . Tobacco use 06/05/2006  . HYPERTENSION, BENIGN SYSTEMIC 06/05/2006  . Osteoarthritis 06/05/2006  . Generalized anxiety disorder 06/05/2006    Palliative Care Assessment & Plan   Patient Profile:    Assessment: Bonnie Peterson is 74 years old.  She has life limiting illness of metastatic cholangiocarcinoma.  She is currently under hospice care.  She has been admitted to the hospital for intractable symptoms of persistent nausea vomiting.  This is  deemed secondary to gastric outlet obstruction from her metastatic cancer.  She has hypertension and severe protein calorie malnutrition due to her life limiting illness of malignancy.    Recommendations/Plan: EGD on 02-18-20 showed severe esophagitis and gastritis, deformity in the prepyloric region of the stomach, interventional radiology has been consulted, patient to go today for possible GJ tube. Patient and patient's family is all for any reasonable procedures and interventions that can alleviate symptoms and improve quality of life and some degree of life prolongation, they are typically aware of the life limiting serious irreversible illness of widely metastatic cholangiocarcinoma.  Continue current pain and non pain symptom management.      Goals of Care and Additional Recommendations: Limitations on Scope of Treatment: Avoid Hospitalization  Code Status:    Code Status Orders  (From admission, onward)         Start     Ordered   02/16/20 1058  Limited resuscitation (code)  Continuous       Question Answer Comment  In the event of cardiac or respiratory ARREST: Initiate Code Blue, Call Rapid Response Yes   In the event of cardiac or respiratory ARREST: Perform CPR No   In the event of cardiac or respiratory ARREST: Perform Intubation/Mechanical Ventilation Yes   In the event of cardiac or respiratory ARREST: Use NIPPV/BiPAp only if indicated Yes   In the event of cardiac or respiratory ARREST: Administer ACLS medications if indicated Yes   In the event of cardiac or respiratory ARREST: Perform Defibrillation or Cardioversion if indicated No   Comments as per family request.      02/16/20 1057        Code Status History    Date Active Date Inactive Code Status Order ID Comments User Context   02/14/2020 1844 02/16/2020 1057 DNR [id number]  Bonnie Finner, DO Inpatient   01/14/2020 1759 01/15/2020 2117 DNR [id number]  Bonnie Finner, DO Inpatient   09/07/2015 1122 09/08/2015 0321  Full Code [id number]  Bonnie Peterson, [place]   06/14/2015 1754 06/16/2015 1611 Full Code  [id number]  Bonnie Jansky, MD Inpatient   10/04/2014 1412 10/05/2014 0321 Full Code [id number]  Bonnie Daft, MD [place] Planning Activity    Advance Directive Documentation     Most Recent Value  Type of Advance Directive Healthcare Power of Attorney  Pre-existing out of facility DNR order (yellow form or pink MOST form) --  "MOST" Form in Place? --      Prognosis:  guarded  Discharge Planning: To Be Determined  Care plan was discussed with  Patient and daughter.   Thank you for allowing the Palliative Medicine Team to assist in the care of this patient.   Time In:  10 Time Out: 10.25 Total Time 25 Prolonged Time Billed  no       Greater than 50%  of this time was spent counseling and coordinating care related to the above assessment and plan.  Loistine Chance, MD  Please contact Palliative Medicine Team phone at [phone] for questions and concerns.

## 2020-02-21 NOTE — Progress Notes (Signed)
Manufacturing engineer [facility]) [facility] Liaison Note    Ms. Ulloa is a current hospice patient with a terminal diagnosis of intrahepatic cholangiocarcinoma per Dr. Cherie Ouch of  [facility]. Patient reports more than a week of intractable nausea and vomiting despite multiple ACC case manager visits and adjustments to medications.  EMS was activated by family after making hospice triage RN aware. She is admitted for intractable nausea and vomiting.  Per Dr. Karie Georges this is a related admission  Spoke with daughter Kennyth Lose via telephone - family concerned about patient hospitalization and Gtube placement procedure this afternoon - listened and supported and encouraged to verbalize feelings.  Also, visited patient with daughter [name] at bedside - patient resting quietly with eyes closed in NAD. Patient denies discomfort at this time, stating that she is still experiencing intermittent nausea/vomiting but feels better when medicated with Phenergan as needed. Patient looking forward to Gtube placement for decompression and Jtube for feedeing purposes this afternoon. Patient wishes are to remain with hospice upon discharge and going home soon - "tired of resting in this hospital bed and not being able to eat".   Family aware that an [facility] Liaison will continue to follow patient daily during this hospitalization.      V/S:  98.4, 90, 16, 177/89, 99 on RA I&O: +500 Abnormal lab work: RBC 3.64, Hemo 10.5, HCT 34.4, Plate 111, Glucose 622 Diagnostics:  None within last 24hrs IVs/PRNs: Ancef IVPB 2g/100 ml adm on 11/15 at 0845, D5NS w/KCL 41mq IVF at 510mhr, Dilaudid 0.52m46m2hr prn adm at 0819 and 1058 on 02/21/20 (and xs 4 doses on 02/20/20), Phenergan 6.52mg61mhr prn inj at 0758 and 1444 on 02/20/20 and remains on Reglan 52mg 37m q6hrs.   MD Problem list (EPIC): Intractable nausea and vomiting -Possibly gastric outlet obstruction, CT scan in October showed mural thickening of the distal stomach, predominantly  involving the antrum with a somewhat masslike appearance.  Thickening extends into the pyloric region as well as the duodenal bulb, possibly sequela of prior radiation therapy versus underlying mass. Not a candidate for chemotherapy per oncology -Due to persistent and progressive symptoms she underwent a repeat CT scan of the abdomen and pelvis 11/11 which showed again circumferential mural thickening and edema on the distal body of the stomach, antrum.  I have consulted gastroenterology, patient underwent an EGD on 11/12 which showed severe esophagitis, gastritis as well as a deformity in the prepyloric region of the stomach that likely caused delayed gastric emptying but there is no complete gastric obstruction.  There is no endoscopic intervention available to minimize her symptoms per GI. -I discussed case with IR as well, it appears that her stomach is accessible for G tube placement however a J arm may be difficult to place.  This would be a two-step process.  I discussed over the weekend with family, they are now agreeable.  Reconsult interventional radiology this morning, it appears that she will have a G-Tube placement today   Active Problems Widely metastatic cholangiocarcinoma -Not a candidate for further treatment.  Palliative consulted.  Patient and family have difficulties understanding gravity of her condition    Hypokalemia -Due to poor p.o. intake, continue to follow daily and supplement as needed.  Hopefully she will get a J arm for feeding   Essential hypertension -Unable to tolerate p.o. medications, she was placed on clonidine patch with IV as needed's, blood pressure still in the 170s.  Anticipate improvement in her blood pressure with improvement in her nausea  and symptoms   Type 2 diabetes mellitus -With poor p.o. intake avoid strict CBG control   Thrombocytopenia -Possibly related to malignancy, platelets overall stable, no bleeding    D/C planning- Ongoing - Need  pain/nausea managed and IV tx so not medically stable at this time. Please use [facility] for all ACC transportation needs.  Goals of Care: Ongoing  - Partial code - will continue to assess GOC during this hospitalization. Patient continues to want support of Hospice upon discharge. Communication with [facility]T- [place] team updated Communication with [facility] - Spoke and supported patient daughters.   Please call with any hospice related questions/concerns,   Gar Ponto, RN [place] (in [place]) [phone]

## 2020-02-21 NOTE — Care Management Important Message (Signed)
Important Message  Patient Details IM Letter given to the Patient Name: Bonnie Peterson MRN: [id number] Date of Birth: 1945/08/07   Medicare Important Message Given:  Yes     [name] 02/21/2020, 11:12 AM

## 2020-02-21 NOTE — Progress Notes (Addendum)
PROGRESS NOTE  Bonnie Peterson ZJQ:[id number] DOB: [birth year] DOA: 02/14/2020 PCP: Lattie Haw, MD   LOS: 6 days   Brief Narrative / Interim history: 74 year old female with intrahepatic adenocarcinoma with metastatic disease, came into the ED with intractable nausea and vomiting along with abdominal pain.  This has been going on for at least 3 weeks, progressively getting worse.  She has used Reglan, scopolamine, Phenergan at home but symptoms have worsened and came to the hospital.  She has hospice services at home but not comfort care at this point.  Oncology has been evaluated patient also, and it seems like her refractory nausea and vomiting are probably secondary to gastric outlet obstruction from her metastatic cancer, she is not a candidate for chemotherapy anymore.  Palliative care also consulted.  Subjective / 24h Interval events: Had an episode of vomiting last night, still unable to eat and drink anything  Assessment & Plan: Principal Problem Intractable nausea and vomiting -Possibly gastric outlet obstruction, CT scan in October showed mural thickening of the distal stomach, predominantly involving the antrum with a somewhat masslike appearance.  Thickening extends into the pyloric region as well as the duodenal bulb, possibly sequela of prior radiation therapy versus underlying mass. Not a candidate for chemotherapy per oncology -Due to persistent and progressive symptoms she underwent a repeat CT scan of the abdomen and pelvis 11/11 which showed again circumferential mural thickening and edema on the distal body of the stomach, antrum.  I have consulted gastroenterology, patient underwent an EGD on 11/12 which showed severe esophagitis, gastritis as well as a deformity in the prepyloric region of the stomach that likely caused delayed gastric emptying but there is no complete gastric obstruction.  There is no endoscopic intervention available to minimize her symptoms per GI. -I  discussed case with IR as well, it appears that her stomach is accessible for G tube placement however a J arm may be difficult to place.  This would be a two-step process.  I discussed over the weekend with family, they are now agreeable.  Reconsult interventional radiology this morning, it appears that she will have a G-Tube placement today  Active Problems Widely metastatic cholangiocarcinoma -Not a candidate for further treatment.  Palliative consulted.  Patient and family have difficulties understanding gravity of her condition   Hypokalemia -Due to poor p.o. intake, continue to follow daily and supplement as needed.  Hopefully she will get a J arm for feeding  Essential hypertension -Unable to tolerate p.o. medications, she was placed on clonidine patch with IV as needed's, blood pressure still in the 170s.  Anticipate improvement in her blood pressure with improvement in her nausea and symptoms  Type 2 diabetes mellitus -With poor p.o. intake avoid strict CBG control  Thrombocytopenia -Possibly related to malignancy, platelets overall stable, no bleeding  Goals of care -Remains partial code, appreciate palliative follow-up  Scheduled Meds: . Chlorhexidine Gluconate Cloth  6 each Topical Daily  . cloNIDine  0.3 mg Transdermal Weekly  . metoCLOPramide (REGLAN) injection  5 mg Intravenous Q6H  . polyethylene glycol  17 g Oral Daily  . senna-docusate  2 tablet Oral BID  . sodium chloride flush  10-40 mL Intracatheter Q12H   Continuous Infusions: .  ceFAZolin (ANCEF) IV    . dextrose 5 % and 0.9 % NaCl with KCl 40 mEq/L 50 mL/hr at 02/20/20 2220   PRN Meds:.haloperidol, hydrALAZINE, HYDROmorphone (DILAUDID) injection, LORazepam, promethazine, sodium chloride flush  Diet Orders (From admission, onward)  Start     Ordered   02/22/20 0001  Diet NPO time specified Except for: Sips with Meds  Diet effective midnight       Comments: For IR procedure tentatively scheduled for  11.15.21  Question:  Except for  Answer:  Ferrel Logan with Meds   02/21/20 1660   02/19/20 0811  Diet clear liquid Room service appropriate? Yes; Fluid consistency: Thin  Diet effective now       Question Answer Comment  Room service appropriate? Yes   Fluid consistency: Thin      02/19/20 0810          DVT prophylaxis: SCDs Start: 02/14/20 1844     Code Status: Partial Code  Family Communication: Discussed with son yesterday at bedside  Status is: Inpatient  Remains inpatient appropriate because:Ongoing active pain requiring inpatient pain management, IV treatments appropriate due to intensity of illness or inability to take PO and Inpatient level of care appropriate due to severity of illness   Dispo: The patient is from: Home              Anticipated d/c is to: Home              Anticipated d/c date is: 3 days              Patient currently is not medically stable to d/c.   Consultants:  Palliative care   Procedures:  EGD 11/12 Impression:               - LA Grade D reflux esophagitis with no bleeding.                            Biopsied to exclude infection. This may be a result                            of her ongoing vomiting as opposed to the cause of                            symptoms.                           - Erythematous mucosa in the gastric fundus,                            gastric body and antrum. Biopsied for H pylori.                           - Deformity in the prepyloric region of the stomach                            that likely causes delayed gastric emptying but not                            complete gastric obstruction.                           - Given these findings, management of her symptoms  is challenging. No endoscopic interventions                            available to minimize her symptoms. Moderate Sedation:      Not Applicable - Patient had care per Anesthesia. Recommendation:           - Return patient to  hospital ward for ongoing care.                           - Clear liquid diet. Could consider trial of a very                            low residue diet.                           - Continue present medications. IV PPI BID. Add                            Carafate slurry QID.                           - Keep the head of the bed elevated as much as                            possible.                           - Await pathology results to exclude concurrent                            infectious processes.  Microbiology  None   Antimicrobials: None     Objective: Vitals:   02/20/20 0433 02/20/20 0755 02/20/20 1331 02/21/20 0504  BP: (!) 188/78  (!) 176/152 (!) 177/89  Pulse: 72  76 90  Resp: 20 20 17 16   Temp: 98.3 F (36.8 C)  98.3 F (36.8 C) 98.4 F (36.9 C)  TempSrc: Oral     SpO2: 100%  100% 99%  Weight:      Height:        Intake/Output Summary (Last 24 hours) at 02/21/2020 9735 Last data filed at 02/20/2020 1820 Gross per 24 hour  Intake 500 ml  Output --  Net 500 ml   Filed Weights   02/16/20 2200  Weight: 59 kg    Examination:  Constitutional: No distress Eyes: No scleral icterus ENMT: Moist mucous membranes Neck: normal, supple Respiratory: Lungs are clear bilaterally, no wheezing, no crackles Cardiovascular: Heart is regular, no murmurs, no edema Abdomen: Mild discomfort in the epigastric area, bowel sounds positive Musculoskeletal: no clubbing / cyanosis.  Skin: No rashes appreciated Neurologic: Nonfocal, equal strength  Data Reviewed: I have independently reviewed following labs and imaging studies   CBC: Recent Labs  Lab 02/16/20 0413 02/17/20 0612 02/18/20 0405 02/19/20 0529 02/20/20 0522  WBC 5.2 5.5 7.2 9.9 5.2  HGB 12.1 11.3* 11.5* 11.9* 10.5*  HCT 38.0 36.0 36.5 37.1 34.4*  MCV 93.1 94.2 92.2 92.8 94.5  PLT 111* 102* 114* 139* 329*   Basic Metabolic Panel: Recent Labs  Lab 02/14/20 1307 02/15/20 0417 02/16/20 0413  02/17/20 9242  02/18/20 0405 02/19/20 0529 02/20/20 0522  NA  --    < > 137 138 140 140 143  K  --    < > 3.4* 4.3 4.0 4.3 4.4  CL  --    < > 101 104 106 105 110  CO2  --    < > 28 28 26 24 26   GLUCOSE  --    < > 120* 170* 165* 139* 120*  BUN  --    < > 10 8 6* 7* 9  CREATININE  --    < > 0.54 0.60 0.50 0.66 0.63  CALCIUM  --    < > 8.2* 8.5* 8.8* 9.1 9.1  MG 2.2  --  2.0 1.8  --   --   --   PHOS  --   --   --  2.2*  --   --   --    < > = values in this interval not displayed.   Liver Function Tests: Recent Labs  Lab 02/15/20 0417 02/17/20 0612 02/18/20 0405 02/19/20 0529 02/20/20 0522  AST 33 38 31 31 26   ALT 17 21 21 20 17   ALKPHOS 76 75 77 77 68  BILITOT 0.7 1.0 0.6 0.8 0.5  PROT 6.1* 6.2* 6.3* 6.5 5.7*  ALBUMIN 2.8* 2.5* 2.5* 2.9* 2.4*   Coagulation Profile: No results for input(s): INR, PROTIME in the last 168 hours. HbA1C: No results for input(s): HGBA1C in the last 72 hours. CBG: Recent Labs  Lab 02/19/20 1654 02/20/20 0719 02/20/20 1102 02/20/20 1628 02/21/20 0732  GLUCAP 119* 125* 115* 122* 119*    Recent Results (from the past 240 hour(s))  Respiratory Panel by RT PCR (Flu A&B, Covid) - Nasopharyngeal Swab     Status: None   Collection Time: 02/14/20  3:45 PM   Specimen: Nasopharyngeal Swab  Result Value Ref Range Status   SARS Coronavirus 2 by RT PCR NEGATIVE NEGATIVE Final    Comment: (NOTE) SARS-CoV-2 target nucleic acids are NOT DETECTED.  The SARS-CoV-2 RNA is generally detectable in upper respiratoy specimens during the acute phase of infection. The lowest concentration of SARS-CoV-2 viral copies this assay can detect is 131 copies/mL. A negative result does not preclude SARS-Cov-2 infection and should not be used as the sole basis for treatment or other patient management decisions. A negative result may occur with  improper specimen collection/handling, submission of specimen other than nasopharyngeal swab, presence of viral mutation(s)  within the areas targeted by this assay, and inadequate number of viral copies (<131 copies/mL). A negative result must be combined with clinical observations, patient history, and epidemiological information. The expected result is Negative.  Fact Sheet for Patients:  PinkCheek.be  Fact Sheet for Healthcare Providers:  GravelBags.it  This test is no t yet approved or cleared by the [place] FDA and  has been authorized for detection and/or diagnosis of SARS-CoV-2 by FDA under an Emergency Use Authorization (EUA). This EUA will remain  in effect (meaning this test can be used) for the duration of the COVID-19 declaration under Section 564(b)(1) of the Act, 21 U.S.C. section 360bbb-3(b)(1), unless the authorization is terminated or revoked sooner.     Influenza A by PCR NEGATIVE NEGATIVE Final   Influenza B by PCR NEGATIVE NEGATIVE Final    Comment: (NOTE) The Xpert Xpress SARS-CoV-2/FLU/RSV assay is intended as an aid in  the diagnosis of influenza from Nasopharyngeal swab specimens and  should not be used as a sole basis for  treatment. Nasal washings and  aspirates are unacceptable for Xpert Xpress SARS-CoV-2/FLU/RSV  testing.  Fact Sheet for Patients: PinkCheek.be  Fact Sheet for Healthcare Providers: GravelBags.it  This test is not yet approved or cleared by the [place] FDA and  has been authorized for detection and/or diagnosis of SARS-CoV-2 by  FDA under an Emergency Use Authorization (EUA). This EUA will remain  in effect (meaning this test can be used) for the duration of the  Covid-19 declaration under Section 564(b)(1) of the Act, 21  U.S.C. section 360bbb-3(b)(1), unless the authorization is  terminated or revoked. Performed at [facility], [place] [address], [place], [place] [address]     Time spent: 40 minutes, more  than 50% involved in counseling/discussion with the patient as well as patient's family, discussing with IR  Radiology Studies: No results found.   Marzetta Board, MD, PhD [facility]  Between 7 am - 7 pm I am available, please contact me via Amion or Securechat  Between 7 pm - 7 am I am not available, please contact night coverage MD/APP via Amion

## 2020-02-21 NOTE — Procedures (Signed)
Pre procedure Dx: Gastric outlet obstruction. Post Procedure Dx: Same  Successful fluoroscopic guided insertion of gastrostomy tube.   The gastrostomy tube may be used immediately for venting purposes.  Attempts were made to convert the G-tube to a GJ tube however NO contrast passed from the level of the gastric antrum into the proximal small bowel.     EBL: Minimal Complications: None immediate  Ronny Bacon, MD Pager #: [phone]

## 2020-02-22 DIAGNOSIS — R52 Pain, unspecified: Secondary | ICD-10-CM | POA: Diagnosis not present

## 2020-02-22 DIAGNOSIS — R112 Nausea with vomiting, unspecified: Secondary | ICD-10-CM | POA: Diagnosis not present

## 2020-02-22 DIAGNOSIS — Z515 Encounter for palliative care: Secondary | ICD-10-CM | POA: Diagnosis not present

## 2020-02-22 DIAGNOSIS — C221 Intrahepatic bile duct carcinoma: Secondary | ICD-10-CM | POA: Diagnosis not present

## 2020-02-22 LAB — CBC
HCT: 33.2 % — ABNORMAL LOW (ref 36.0–46.0)
Hemoglobin: 10.2 g/dL — ABNORMAL LOW (ref 12.0–15.0)
MCH: 29.3 pg (ref 26.0–34.0)
MCHC: 30.7 g/dL (ref 30.0–36.0)
MCV: 95.4 fL (ref 80.0–100.0)
Platelets: 98 10*3/uL — ABNORMAL LOW (ref 150–400)
RBC: 3.48 MIL/uL — ABNORMAL LOW (ref 3.87–5.11)
RDW: 15.8 % — ABNORMAL HIGH (ref 11.5–15.5)
WBC: 4.8 10*3/uL (ref 4.0–10.5)
nRBC: 0 % (ref 0.0–0.2)

## 2020-02-22 LAB — BASIC METABOLIC PANEL
Anion gap: 1 — ABNORMAL LOW (ref 5–15)
BUN: 9 mg/dL (ref 8–23)
CO2: 31 mmol/L (ref 22–32)
Calcium: 8.8 mg/dL — ABNORMAL LOW (ref 8.9–10.3)
Chloride: 109 mmol/L (ref 98–111)
Creatinine, Ser: 0.57 mg/dL (ref 0.44–1.00)
GFR, Estimated: >60 mL/min (ref >60)
Glucose, Bld: 129 mg/dL — ABNORMAL HIGH (ref 70–99)
Potassium: 4 mmol/L (ref 3.5–5.1)
Sodium: 141 mmol/L (ref 135–145)

## 2020-02-22 LAB — GLUCOSE, CAPILLARY
Glucose-Capillary: 112 mg/dL — ABNORMAL HIGH (ref 70–99)
Glucose-Capillary: 117 mg/dL — ABNORMAL HIGH (ref 70–99)
Glucose-Capillary: 132 mg/dL — ABNORMAL HIGH (ref 70–99)

## 2020-02-22 MED ORDER — HYDROMORPHONE HCL 2 MG PO TABS: 2.0000 mg | ORAL_TABLET | ORAL | 0 refills | Status: DC | PRN

## 2020-02-22 MED ORDER — CLONIDINE 0.3 MG/24HR TD PTWK: 0.3000 mg | MEDICATED_PATCH | TRANSDERMAL | 12 refills | Status: DC

## 2020-02-22 MED ORDER — PANTOPRAZOLE SODIUM 40 MG PO TBEC: 40.0000 mg | DELAYED_RELEASE_TABLET | Freq: Two times a day (BID) | ORAL | 1 refills | Status: DC

## 2020-02-22 MED ORDER — OXYCODONE HCL 5 MG PO TABS: 5.0000 mg | ORAL_TABLET | ORAL | Status: DC | PRN

## 2020-02-22 MED ORDER — LORAZEPAM 0.5 MG PO TABS
0.5000 mg | ORAL_TABLET | Freq: Four times a day (QID) | ORAL | Status: DC | PRN
  Administered 2020-02-22: 0.5 mg via ORAL
  Filled 2020-02-22: qty 1

## 2020-02-22 MED ORDER — GLUCERNA SHAKE PO LIQD
237.0000 mL | Freq: Three times a day (TID) | ORAL | Status: DC
  Administered 2020-02-22 – 2020-02-23 (×2): 237 mL via ORAL
  Filled 2020-02-22 (×4): qty 237

## 2020-02-22 MED ORDER — ONDANSETRON HCL 4 MG/2ML IJ SOLN
4.0000 mg | Freq: Three times a day (TID) | INTRAMUSCULAR | Status: DC | PRN
  Administered 2020-02-22: 4 mg via INTRAVENOUS
  Filled 2020-02-22: qty 2

## 2020-02-22 MED ORDER — HYDROMORPHONE HCL 2 MG PO TABS
2.0000 mg | ORAL_TABLET | ORAL | Status: DC | PRN
  Administered 2020-02-22: 2 mg via ORAL
  Filled 2020-02-22: qty 1

## 2020-02-22 MED ORDER — ALUM & MAG HYDROXIDE-SIMETH 200-200-20 MG/5ML PO SUSP
30.0000 mL | Freq: Four times a day (QID) | ORAL | Status: DC | PRN
  Administered 2020-02-22: 30 mL via ORAL
  Filled 2020-02-22: qty 30

## 2020-02-22 MED ORDER — GLUCERNA SHAKE PO LIQD
237.0000 mL | Freq: Three times a day (TID) | ORAL | Status: DC
  Administered 2020-02-22: 237 mL via ORAL
  Filled 2020-02-22 (×4): qty 237

## 2020-02-22 MED ORDER — METOCLOPRAMIDE HCL 5 MG PO TABS
5.0000 mg | ORAL_TABLET | Freq: Three times a day (TID) | ORAL | Status: DC
  Administered 2020-02-22 – 2020-02-23 (×2): 5 mg via ORAL
  Filled 2020-02-22 (×2): qty 1

## 2020-02-22 NOTE — Progress Notes (Signed)
Patient seen for g-tube site s/p placement of 20Fr pull-through gastrostomy tube on 11.15.21 in IR with Dr. Owens Shark   Insertion site is clean, dry, dressed and appropriately tender to palpation. Tube is capped on exam. No bleeding or leakage.    Okay to begin using g-tube for medicines, tube feeds, free water, etc.  Please call IR for any questions or concerns regarding the g-tube.

## 2020-02-22 NOTE — Progress Notes (Signed)
Manufacturing engineer [facility] Liaison Note  Ms. Colbaugh is a current hospice patient with a terminal diagnosis of intrahepatic cholangiocarcinoma per Dr. Elvia Collum. Patient reports more than a week of intractable nausea and vomiting despite multiple [facility] case manager visits and adjustments to medications. EMS was activated by family after making hospice triage RN aware. She is admittedfor intractable nausea and vomiting. Per Dr. Karie Georges this is a related admission  Visited patient at bedside. Sitting on edge of bed in good spirits about feeling better. No N/V since G-tube placed. Concerned about how to work the tube once discharged. She is hoping to be discharged home today by EMS. Patient stated she has been declining her Senokot due to having nothing on stomach. I have spoken to RN and patients daughter Kennyth Lose and updated on patient status.   VS: BP 148/64, HR 80, RR 16, O2 98% RA I&O: nothing charted Labs: Glucose: 129 (H) Creatinine: 0.57 Calcium: 8.8 (L) Anion gap: 1 (L) RBC: 3.48 (L) Hemoglobin: 10.2 (L) HCT: 33.2 (L) RDW: 15.8 (H) Platelets: 98 (L) IV/PRN:   ceFAZolin (ANCEF) IVPB 2g/100 mL premix  2 g,   Intravenous,   200 mL/hr,    Once       02/21/20 0845 --  02/19/20 0512   sodium chloride flush (NS) 0.9 % injection 10-40 mL  10-40 mL,   Intracatheter,   Every 12 hours       02/19/20 1000 -- 02/19/20 0735   cloNIDine (CATAPRES - Dosed in mg/24 hr) patch 0.3 mg  0.3 mg,   Transdermal,   Weekly       02/19/20 1000 -- 02/17/20 0832   polyethylene glycol (MIRALAX / GLYCOLAX) packet 17 g  17 g,   Oral,   Daily       02/17/20 1000 -- 02/17/20 0832   senna-docusate (Senokot-S) tablet 2 tablet  2 tablet,   Oral,   2 times daily       02/17/20 1000 -- 02/15/20 1117   metoCLOPramide (REGLAN) injection 5 mg  5 mg,   Intravenous,   Every 6 hours           hydrALAZINE (APRESOLINE) injection 10 mg  10 mg,   Intravenous,   Every 4 hours PRN       02/17/20  1528 --  02/17/20 0425   LORazepam (ATIVAN) injection 0.5 mg  0.5 mg,   Intravenous,   Every 4 hours PRN       02/17/20 0425 -- 02/16/20 1525   promethazine (PHENERGAN) injection 6.25 mg  6.25 mg,   Intravenous,   Every 4 hours PRN       02/16/20 1530 -- 02/16/20 0729   HYDROmorphone (DILAUDID) injection 0.5 mg  0.5 mg,   Intravenous,   Every 2 hours PRN         No new updated MD notes as of now: D/C planning-Possible D/C home today by [facility] if medically stable  GOC: Ongoing. Partial Code. Hospice at d/c  IDT: [facility] team updated Spoke with daughter Kennyth Lose and updated on plan of care.   Clementeen Hoof, BSN, [place] (in [place]) [phone]

## 2020-02-22 NOTE — Progress Notes (Signed)
PROGRESS NOTE  Bonnie LANZO IDP:[id number] DOB: [birth year] DOA: 02/14/2020 PCP: Lattie Haw, MD   LOS: 7 days   Brief Narrative / Interim history: 74 year old female with intrahepatic adenocarcinoma with metastatic disease, came into the ED with intractable nausea and vomiting along with abdominal pain.  This has been going on for at least 3 weeks, progressively getting worse.  She has used Reglan, scopolamine, Phenergan at home but symptoms have worsened and came to the hospital.  She has hospice services at home but not comfort care at this point.  Oncology has been evaluated patient also, and it seems like her refractory nausea and vomiting are probably secondary to gastric outlet obstruction from her metastatic cancer, she is not a candidate for chemotherapy anymore.  Palliative care also consulted. IR was eventually consulted for venting G-tube, this was done on 11/15, a J arm was attempted for feeding but it could not be done.  Subjective / 24h Interval events: She is feeling much better this morning after the G-tube placement  Assessment & Plan: Principal Problem Intractable nausea and vomiting -Possibly gastric outlet obstruction, CT scan in October showed mural thickening of the distal stomach, predominantly involving the antrum with a somewhat masslike appearance. A repeat CT scan of the abdomen and pelvis 11/11 which showed again circumferential mural thickening and edema on the distal body of the stomach, antrum.  I have consulted gastroenterology, patient underwent an EGD on 11/12 which showed severe esophagitis, gastritis as well as a deformity in the prepyloric region of the stomach that likely caused delayed gastric emptying but there is no complete gastric obstruction.  There is no endoscopic intervention available to minimize her symptoms per GI. I discussed case with IR, she underwent a G-tube placement on 11/15, J arm was unsuccessfully attempted. She is feeling better, has  learned how to vent the G-tube herself, will switch all her medications to orals today and attempt to discharge on Wednesday if she remains stable. She reported a much better response to pain with Dilaudid and will discontinue home oxycodone and placed on oral Dilaudid  Active Problems Widely metastatic cholangiocarcinoma -Not a candidate for further treatment.  Palliative consulted. Hypokalemia -Due to poor p.o. intake, replete as indicated Essential hypertension -Unable to tolerate p.o. medications, she was placed on clonidine patch, blood pressure overall better, given inconsistent oral absorption would favor switching to Bactrim discharge Type 2 diabetes mellitus -With poor p.o. intake avoid strict CBG control Thrombocytopenia -Possibly related to malignancy, platelets overall stable, no bleeding Goals of care -Remains partial code, appreciate palliative follow-up. She has hospice at home  Scheduled Meds: . Chlorhexidine Gluconate Cloth  6 each Topical Daily  . cloNIDine  0.3 mg Transdermal Weekly  . feeding supplement (GLUCERNA SHAKE)  237 mL Oral TID BM  . metoCLOPramide  5 mg Oral TID AC  . polyethylene glycol  17 g Oral Daily  . senna-docusate  2 tablet Oral BID  . sodium chloride flush  10-40 mL Intracatheter Q12H   Continuous Infusions: .  ceFAZolin (ANCEF) IV     PRN Meds:.haloperidol, hydrALAZINE, HYDROmorphone, LORazepam, sodium chloride flush  Diet Orders (From admission, onward)    Start     Ordered   02/21/20 1648  Diet clear liquid Room service appropriate? Yes; Fluid consistency: Thin  Diet effective now       Question Answer Comment  Room service appropriate? Yes   Fluid consistency: Thin      02/21/20 1647  DVT prophylaxis: SCDs Start: 02/14/20 1844     Code Status: Partial Code  Family Communication: Discussed with son yesterday at bedside  Status is: Inpatient  Remains inpatient appropriate because:Ongoing active pain requiring inpatient  pain management, IV treatments appropriate due to intensity of illness or inability to take PO and Inpatient level of care appropriate due to severity of illness   Dispo: The patient is from: Home              Anticipated d/c is to: Home              Anticipated d/c date is: 3 days              Patient currently is not medically stable to d/c.   Consultants:  Palliative care   Procedures:  EGD 11/12 Impression:               - LA Grade D reflux esophagitis with no bleeding.                            Biopsied to exclude infection. This may be a result                            of her ongoing vomiting as opposed to the cause of                            symptoms.                           - Erythematous mucosa in the gastric fundus,                            gastric body and antrum. Biopsied for H pylori.                           - Deformity in the prepyloric region of the stomach                            that likely causes delayed gastric emptying but not                            complete gastric obstruction.                           - Given these findings, management of her symptoms                            is challenging. No endoscopic interventions                            available to minimize her symptoms. Moderate Sedation:      Not Applicable - Patient had care per Anesthesia. Recommendation:           - Return patient to hospital ward for ongoing care.                           - Clear liquid diet. Could consider trial  of a very                            low residue diet.                           - Continue present medications. IV PPI BID. Add                            Carafate slurry QID.                           - Keep the head of the bed elevated as much as                            possible.                           - Await pathology results to exclude concurrent                            infectious processes.  Microbiology  None    Antimicrobials: None     Objective: Vitals:   02/21/20 1650 02/21/20 1949 02/22/20 0328 02/22/20 1405  BP: (!) 193/60 (!) 159/67 (!) 148/64 (!) 181/68  Pulse: 63 79 80 79  Resp: 16 18 16 14   Temp: 98.5 F (36.9 C) 98 F (36.7 C) 99 F (37.2 C) 97.8 F (36.6 C)  TempSrc:    Oral  SpO2: 100% 100% 98% 99%  Weight:      Height:       No intake or output data in the 24 hours ending 02/22/20 1503 Filed Weights   02/16/20 2200  Weight: 59 kg    Examination:  Constitutional: No distress Eyes: No scleral icterus ENMT: Moist mucous membranes Neck: normal, supple Respiratory: Lungs are clear to auscultation, no wheezing, no crackles Cardiovascular: Regular rate and rhythm, no murmurs, no edema Abdomen: Mild discomfort in the epigastric area, bowel sounds positive, no guarding or rebound Musculoskeletal: no clubbing / cyanosis.  Skin: No rashes appreciated Neurologic: Nonfocal  Data Reviewed: I have independently reviewed following labs and imaging studies   CBC: Recent Labs  Lab 02/17/20 0612 02/18/20 0405 02/19/20 0529 02/20/20 0522 02/22/20 0425  WBC 5.5 7.2 9.9 5.2 4.8  HGB 11.3* 11.5* 11.9* 10.5* 10.2*  HCT 36.0 36.5 37.1 34.4* 33.2*  MCV 94.2 92.2 92.8 94.5 95.4  PLT 102* 114* 139* 111* 98*   Basic Metabolic Panel: Recent Labs  Lab 02/16/20 0413 02/16/20 0413 02/17/20 0612 02/18/20 0405 02/19/20 0529 02/20/20 0522 02/22/20 0425  NA 137   < > 138 140 140 143 141  K 3.4*   < > 4.3 4.0 4.3 4.4 4.0  CL 101   < > 104 106 105 110 109  CO2 28   < > 28 26 24 26 31   GLUCOSE 120*   < > 170* 165* 139* 120* 129*  BUN 10   < > 8 6* 7* 9 9  CREATININE 0.54   < > 0.60 0.50 0.66 0.63 0.57  CALCIUM 8.2*   < > 8.5* 8.8* 9.1 9.1 8.8*  MG 2.0  --  1.8  --   --   --   --  PHOS  --   --  2.2*  --   --   --   --    < > = values in this interval not displayed.   Liver Function Tests: Recent Labs  Lab 02/17/20 0612 02/18/20 0405 02/19/20 0529 02/20/20 0522   AST 38 31 31 26   ALT 21 21 20 17   ALKPHOS 75 77 77 68  BILITOT 1.0 0.6 0.8 0.5  PROT 6.2* 6.3* 6.5 5.7*  ALBUMIN 2.5* 2.5* 2.9* 2.4*   Coagulation Profile: No results for input(s): INR, PROTIME in the last 168 hours. HbA1C: No results for input(s): HGBA1C in the last 72 hours. CBG: Recent Labs  Lab 02/21/20 1144 02/21/20 1717 02/21/20 2148 02/22/20 0656 02/22/20 1118  GLUCAP 121* 93 105* 112* 117*    Recent Results (from the past 240 hour(s))  Respiratory Panel by RT PCR (Flu A&B, Covid) - Nasopharyngeal Swab     Status: None   Collection Time: 02/14/20  3:45 PM   Specimen: Nasopharyngeal Swab  Result Value Ref Range Status   SARS Coronavirus 2 by RT PCR NEGATIVE NEGATIVE Final    Comment: (NOTE) SARS-CoV-2 target nucleic acids are NOT DETECTED.  The SARS-CoV-2 RNA is generally detectable in upper respiratoy specimens during the acute phase of infection. The lowest concentration of SARS-CoV-2 viral copies this assay can detect is 131 copies/mL. A negative result does not preclude SARS-Cov-2 infection and should not be used as the sole basis for treatment or other patient management decisions. A negative result may occur with  improper specimen collection/handling, submission of specimen other than nasopharyngeal swab, presence of viral mutation(s) within the areas targeted by this assay, and inadequate number of viral copies (<131 copies/mL). A negative result must be combined with clinical observations, patient history, and epidemiological information. The expected result is Negative.  Fact Sheet for Patients:  PinkCheek.be  Fact Sheet for Healthcare Providers:  GravelBags.it  This test is no t yet approved or cleared by the [place] FDA and  has been authorized for detection and/or diagnosis of SARS-CoV-2 by FDA under an Emergency Use Authorization (EUA). This EUA will remain  in effect (meaning  this test can be used) for the duration of the COVID-19 declaration under Section 564(b)(1) of the Act, 21 U.S.C. section 360bbb-3(b)(1), unless the authorization is terminated or revoked sooner.     Influenza A by PCR NEGATIVE NEGATIVE Final   Influenza B by PCR NEGATIVE NEGATIVE Final    Comment: (NOTE) The Xpert Xpress SARS-CoV-2/FLU/RSV assay is intended as an aid in  the diagnosis of influenza from Nasopharyngeal swab specimens and  should not be used as a sole basis for treatment. Nasal washings and  aspirates are unacceptable for Xpert Xpress SARS-CoV-2/FLU/RSV  testing.  Fact Sheet for Patients: PinkCheek.be  Fact Sheet for Healthcare Providers: GravelBags.it  This test is not yet approved or cleared by the [place] FDA and  has been authorized for detection and/or diagnosis of SARS-CoV-2 by  FDA under an Emergency Use Authorization (EUA). This EUA will remain  in effect (meaning this test can be used) for the duration of the  Covid-19 declaration under Section 564(b)(1) of the Act, 21  U.S.C. section 360bbb-3(b)(1), unless the authorization is  terminated or revoked. Performed at [facility], [place] [address], [place], [place] [address]     Time spent: 40 minutes, more than 50% involved in counseling/discussion with the patient as well as patient's family, discussing with IR  Radiology Studies: IR  GASTROSTOMY TUBE MOD SED  Result Date: 02/21/2020 INDICATION: History of metastatic cholangiocarcinoma now with persistent nausea and vomiting concern for malignant gastric outlet obstruction. Request made for placement of gastrojejunostomy tube for venting and feeding purposes. EXAM: PULL TROUGH GASTROSTOMY TUBE PLACEMENT COMPARISON:  CT the chest, abdomen pelvis-02/17/2020 MEDICATIONS: Ancef 2 gm IV; Antibiotics were administered within 1 hour of the procedure. Phenergan 6.25 mg IV CONTRAST:  70  mL of Omnipaque 300 administered into the gastric lumen. ANESTHESIA/SEDATION: Moderate (conscious) sedation was employed during this procedure. A total of Versed 2 mg and Fentanyl 50 mcg was administered intravenously. Moderate Sedation Time: 36 minutes. The patient's level of consciousness and vital signs were monitored continuously by radiology nursing throughout the procedure under my direct supervision. FLUOROSCOPY TIME:  17 minutes, 42 seconds (4 in 8 mGy) COMPLICATIONS: None immediate. PROCEDURE: Informed written consent was obtained from the patient following explanation of the procedure, risks, benefits and alternatives. A time out was performed prior to the initiation of the procedure. Ultrasound scanning was performed to demarcate the edge of the liver. Maximal barrier sterile technique utilized including caps, mask, sterile gowns, sterile gloves, large sterile drape, hand hygiene and Betadine prep. The left upper quadrant was sterilely prepped and draped. An oral gastric catheter was inserted into the stomach under fluoroscopy. The existing nasogastric feeding tube was removed. The left costal margin and air opacified transverse colon were identified and avoided. Air was injected into the stomach for insufflation and visualization under fluoroscopy. Under sterile conditions a 17 gauge trocar needle was utilized to access the stomach percutaneously beneath the left subcostal margin after the overlying soft tissues were anesthetized with 1% Lidocaine with epinephrine. Note, access was directed towards the gastric antrum given plan to attempt placement of a jejunostomy lumen. Needle position was confirmed within the stomach with aspiration of air and injection of small amount of contrast. A single T tack was deployed for gastropexy. Over an Amplatz guide wire, a 9-French sheath was inserted into the stomach. A snare device was utilized to capture the oral gastric catheter. The snare device was pulled  retrograde from the stomach up the esophagus and out the oropharynx. The 20-French pull-through gastrostomy was connected to the snare device and pulled antegrade through the oropharynx down the esophagus into the stomach and then through the percutaneous tract external to the patient. Contrast was injected further appropriate position functionality. Next, the external portion of the gastrostomy tube was cannulated with initially a Kumpe catheter and ultimately a C2 catheter and despite prolonged efforts, a wire could not be advanced through the gastric antrum to the level of the proximal duodenum. As such the decision was made to terminate the procedure. The gastrostomy was assembled externally. Contrast injection confirms position in the stomach. Several spot radiographic images were obtained in various obliquities for documentation. The patient tolerated procedure well without immediate post procedural complication. FINDINGS: After successful fluoroscopic guided placement, the gastrostomy tube is appropriately positioned with internal disc against the ventral aspect of the gastric lumen. Despite prolonged efforts, a wire could not be advanced through the gastric antrum presumably secondary to malignant obstruction. IMPRESSION: Successful fluoroscopic insertion of a 20-French pull-through gastrostomy tube. The gastrostomy may be used immediately for venting purposes. PLAN: Patient may return for repeat attempt at conversion of gastrostomy tube to a GJ tube later this week as indicated. If this repeat attempt is also unsuccessful, consideration could be made for endoscopic conversion as indicated. Electronically Signed   By: Sandi Mariscal  M.D.   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 Request made for placement of  gastrojejunostomy tube for venting and feeding purposes. EXAM: PULL TROUGH GASTROSTOMY TUBE PLACEMENT COMPARISON:  CT the chest, abdomen pelvis-02/17/2020 MEDICATIONS: Ancef 2 gm IV; Antibiotics were administered within 1 hour of the procedure. Phenergan 6.25 mg IV CONTRAST:  70 mL of Omnipaque 300 administered into the gastric lumen. ANESTHESIA/SEDATION: Moderate (conscious) sedation was employed during this procedure. A total of Versed 2 mg and Fentanyl 50 mcg was administered intravenously. Moderate Sedation Time: 36 minutes. The patient's level of consciousness and vital signs were monitored continuously by radiology nursing throughout the procedure under my direct supervision. FLUOROSCOPY TIME:  17 minutes, 42 seconds (4 in 8 mGy) COMPLICATIONS: None immediate. PROCEDURE: Informed written consent was obtained from the patient following explanation of the procedure, risks, benefits and alternatives. A time out was performed prior to the initiation of the procedure. Ultrasound scanning was performed to demarcate the edge of the liver. Maximal barrier sterile technique utilized including caps, mask, sterile gowns, sterile gloves, large sterile drape, hand hygiene and Betadine prep. The left upper quadrant was sterilely prepped and draped. An oral gastric catheter was inserted into the stomach under fluoroscopy. The existing nasogastric feeding tube was removed. The left costal margin and air opacified transverse colon were identified and avoided. Air was injected into the stomach for insufflation and visualization under fluoroscopy. Under sterile conditions a 17 gauge trocar needle was utilized to access the stomach percutaneously beneath the left subcostal margin after the overlying soft tissues were anesthetized with 1% Lidocaine with epinephrine. Note, access was directed towards the gastric antrum given plan to attempt placement of a jejunostomy lumen. Needle position was confirmed within the stomach with  aspiration of air and injection of small amount of contrast. A single T tack was deployed for gastropexy. Over an Amplatz guide wire, a 9-French sheath was inserted into the stomach. A snare device was utilized to capture the oral gastric catheter. The snare device was pulled retrograde from the stomach up the esophagus and out the oropharynx. The 20-French pull-through gastrostomy was connected to the snare device and pulled antegrade through the oropharynx down the esophagus into the stomach and then through the percutaneous tract external to the patient. Contrast was injected further appropriate position functionality. Next, the external portion of the gastrostomy tube was cannulated with initially a Kumpe catheter and ultimately a C2 catheter and despite prolonged efforts, a wire could not be advanced through the gastric antrum to the level of the proximal duodenum. As such the decision was made to terminate the procedure. The gastrostomy was assembled externally. Contrast injection confirms position in the stomach. Several spot radiographic images were obtained in various obliquities for documentation. The patient tolerated procedure well without immediate post procedural complication. FINDINGS: After successful fluoroscopic guided placement, the gastrostomy tube is appropriately positioned with internal disc against the ventral aspect of the gastric lumen. Despite prolonged efforts, a wire could not be advanced through the gastric antrum presumably secondary to malignant obstruction. IMPRESSION: Successful fluoroscopic insertion of a 20-French pull-through gastrostomy tube. The gastrostomy may be used immediately for venting purposes. PLAN: Patient may return for repeat attempt at conversion of gastrostomy tube to a GJ tube later this week as indicated. If this repeat attempt is also unsuccessful, consideration could be made  for endoscopic conversion as indicated. Electronically Signed   By: Sandi Mariscal M.D.    On: 02/21/2020 16:44     Marzetta Board, MD, PhD [facility]  Between 7 am - 7 pm I am available, please contact me via Amion or Securechat  Between 7 pm - 7 am I am not available, please contact night coverage MD/APP via Amion

## 2020-02-23 ENCOUNTER — Telehealth: Payer: Self-pay | Admitting: Family Medicine

## 2020-02-23 DIAGNOSIS — K56609 Unspecified intestinal obstruction, unspecified as to partial versus complete obstruction: Secondary | ICD-10-CM | POA: Diagnosis not present

## 2020-02-23 LAB — COMPREHENSIVE METABOLIC PANEL
ALT: 16 U/L (ref 0–44)
AST: 32 U/L (ref 15–41)
Albumin: 2.3 g/dL — ABNORMAL LOW (ref 3.5–5.0)
Alkaline Phosphatase: 75 U/L (ref 38–126)
Anion gap: 5 (ref 5–15)
BUN: 10 mg/dL (ref 8–23)
CO2: 26 mmol/L (ref 22–32)
Calcium: 8.6 mg/dL — ABNORMAL LOW (ref 8.9–10.3)
Chloride: 107 mmol/L (ref 98–111)
Creatinine, Ser: 0.56 mg/dL (ref 0.44–1.00)
GFR, Estimated: >60 mL/min (ref >60)
Glucose, Bld: 96 mg/dL (ref 70–99)
Potassium: 3.9 mmol/L (ref 3.5–5.1)
Sodium: 138 mmol/L (ref 135–145)
Total Bilirubin: 0.6 mg/dL (ref 0.3–1.2)
Total Protein: 5.3 g/dL — ABNORMAL LOW (ref 6.5–8.1)

## 2020-02-23 LAB — CBC
HCT: 31.6 % — ABNORMAL LOW (ref 36.0–46.0)
Hemoglobin: 9.9 g/dL — ABNORMAL LOW (ref 12.0–15.0)
MCH: 29.4 pg (ref 26.0–34.0)
MCHC: 31.3 g/dL (ref 30.0–36.0)
MCV: 93.8 fL (ref 80.0–100.0)
Platelets: 86 10*3/uL — ABNORMAL LOW (ref 150–400)
RBC: 3.37 MIL/uL — ABNORMAL LOW (ref 3.87–5.11)
RDW: 15.5 % (ref 11.5–15.5)
WBC: 4.8 10*3/uL (ref 4.0–10.5)
nRBC: 0 % (ref 0.0–0.2)

## 2020-02-23 LAB — GLUCOSE, CAPILLARY: Glucose-Capillary: 91 mg/dL (ref 70–99)

## 2020-02-23 MED ORDER — HEPARIN SOD (PORK) LOCK FLUSH 100 UNIT/ML IV SOLN
500.0000 [IU] | INTRAVENOUS | Status: AC | PRN
  Administered 2020-02-23: 500 [IU]
  Filled 2020-02-23: qty 5

## 2020-02-23 NOTE — Progress Notes (Signed)
Manufacturing engineer (ACC)  Noted plan to d/c home today with daughter [name].  Hospice will resume care at home with Ms. Rowley.  [name] states she will pick her mother up today POV at 1 pm.  Thank you, Venia Carbon RN, BSN, [facility] Liaison

## 2020-02-23 NOTE — Telephone Encounter (Signed)
Called pt to see how she is doing after hospital admission. Left VM and recommends she books follow up at [facility] if she can.

## 2020-02-23 NOTE — Discharge Summary (Signed)
Physician Discharge Summary  Bonnie Peterson:[id number] DOB: January 14, 1946 DOA: 02/14/2020  PCP: Lattie Haw, MD  Admit date: 02/14/2020 Discharge date: 02/23/2020  Admitted From: Home.  Disposition:  Home with Hospice.   Recommendations for Outpatient Follow-up:  1. Follow up with PCP in 1-2 weeks 2. Please obtain BMP/CBC in one week Please follow up with Home Hospice as recommended.   Discharge Condition: Hospice CODE STATUS: Partial code.  Diet recommendation: liquid diet advance as tolerated.   Brief/Interim Summary: 74 year old female with intrahepatic adenocarcinoma with metastatic disease, came into the ED with intractable nausea and vomiting along with abdominal pain.  This has been going on for at least 3 weeks, progressively getting worse.  She has used Reglan, scopolamine, Phenergan at home but symptoms have worsened and came to the hospital.  She has hospice services at home but not comfort care at this point.  Oncology has been evaluated patient also, and it seems like her refractory nausea and vomiting are probably secondary to gastric outlet obstruction from her metastatic cancer, she is not a candidate for chemotherapy anymore.  Palliative care also consulted. IR was eventually consulted for venting G-tube, this was done on 11/15, a J arm was attempted for feeding but it could not be done. She was started on clears and able to tolerate for now. advanced to full liquid diet and slowly advance as tolerated.   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 A repeat CT scan of the abdomen and pelvis 11/11 which showed again circumferential mural thickening and edema on the distal body of the stomach, antrum.  I have consulted gastroenterology, patient underwent an EGD on 11/12 which showed severe esophagitis, gastritis as well as a deformity in the prepyloric region of the stomach that likely caused delayed gastric emptying but there is no complete gastric obstruction.  There is no endoscopic intervention available to minimize her symptoms per GI. I discussed case with IR, she underwent a G-tube placement on 11/15, J arm was unsuccessfully attempted. She is feeling better, has learned how to vent the G-tube herself, will switch all her medications to orals today and attempt to discharge on Wednesday if she remains stable. She reported a much better response to pain with Dilaudid and will discontinue home oxycodone and placed on oral Dilaudid.    Widely metastatic cholangiocarcinoma -Not a candidate for further treatment.  Palliative consulted. Hypokalemia -Due to poor p.o. intake, repleted Essential hypertension -Unable to tolerate p.o. medications, she was placed on clonidine patch, blood pressure overall better, given inconsistent oral absorption. Type 2 diabetes mellitus -With poor p.o. intake avoid strict CBG control Thrombocytopenia -Possibly related to malignancy, platelets overall stable, no bleeding Goals of care -Remains partial code, appreciate palliative follow-up. She has hospice at home  Discharge Instructions  Discharge Instructions    Diet - low sodium heart healthy   Complete by: As directed    Discharge instructions   Complete by: As directed    PLEASE follow up with Hospice as recommended.     Allergies as of 02/23/2020      Reactions   Other  Propofol - Anesthesia for MRI--made her "breathe funny"   Propofol Other (See Comments)   "It makes me feel like I'm on fire."      Medication List    STOP taking  these medications   amLODipine 10 MG tablet Commonly known as: NORVASC   atenolol 25 MG tablet Commonly known as: TENORMIN   diclofenac sodium 1 % Gel Commonly known as: VOLTAREN   hydrochlorothiazide 25 MG tablet Commonly known as: HYDRODIURIL   lisinopril 40 MG tablet Commonly known as: ZESTRIL   loratadine 10 MG tablet Commonly known as: CLARITIN   metFORMIN 500 MG tablet Commonly known as: GLUCOPHAGE   oxyCODONE 5 MG immediate release tablet Commonly known as: Oxy IR/ROXICODONE   Oxycodone HCl 10 MG Tabs   potassium chloride 10 MEQ tablet Commonly known as: KLOR-CON   Rivaroxaban Stater Pack (15 mg and 20 mg) Commonly known as: XARELTO STARTER PACK     TAKE these medications   albuterol 108 (90 Base) MCG/ACT inhaler Commonly known as: ProAir HFA Inhale 1-2 puffs into the lungs every 6 (six) hours as needed for wheezing or shortness of breath.   alendronate 70 MG tablet Commonly known as: FOSAMAX TAKE 1 TABLET  ONCE A WEEK. What changed:   how much to take  how to take this  when to take this   cetirizine 10 MG tablet Commonly known as: ZYRTEC Take 10 mg by mouth daily as needed for allergies.   cholecalciferol 25 MCG (1000 UNIT) tablet Commonly known as: VITAMIN D Take 1 tablet (1,000 Units total) by mouth daily.   CLEAR EYES COMPLETE OP Place 1 drop into both eyes daily as needed (dry eyes).   cloNIDine 0.3 mg/24hr patch Commonly known as: CATAPRES - Dosed in mg/24 hr Place 1 patch (0.3 mg total) onto the skin once a week. Start taking on: February 26, 2020   dicyclomine 20 MG tablet Commonly known as: BENTYL Take 1 tablet (20 mg total) by mouth 2 (two) times daily as needed for spasms (abdominal pain).   famotidine 20 MG tablet Commonly known as: PEPCID Take 1 tablet (20 mg total) by mouth 2 (two) times daily. What changed:   when to take this  reasons to take this   fish oil-omega-3 fatty acids 1000 MG capsule Take 1 g by mouth  once a week.   fluticasone 50 MCG/ACT nasal spray Commonly known as: FLONASE Place 2 sprays into both nostrils daily as needed for rhinitis.   haloperidol 0.5 MG tablet Commonly known as: HALDOL Take 1 tablet (0.5 mg total) by mouth every 6 (six) hours as needed for agitation (or nausea).   HYDROmorphone 2 MG tablet Commonly known as: DILAUDID Take 1 tablet (2 mg total) by mouth every 4 (four) hours as needed for severe pain.   lidocaine-prilocaine cream Commonly known as: EMLA Apply 1 application topically every 14 (fourteen) days. Every 14 days as needed What changed:   when to take this  reasons to take this  additional instructions   LORazepam 1 MG tablet Commonly known as: ATIVAN Take 1 mg by mouth daily as needed for anxiety. What changed: Another medication with the same name was removed. Continue taking this medication, and follow the directions you see here.   metoCLOPramide 10 MG tablet Commonly known as: REGLAN Take 10 mg by mouth 4 (four) times daily. What changed: Another medication with the same name was removed. Continue taking this medication, and follow the directions you see here.  mirtazapine 15 MG disintegrating tablet Commonly known as: REMERON SOL-TAB Take 1 tablet (15 mg total) by mouth at bedtime.   multivitamin with minerals Tabs tablet Take 1 tablet by mouth daily.   ondansetron 8 MG disintegrating tablet Commonly known as: Zofran ODT Take 1 tablet (8 mg total) by mouth every 8 (eight) hours as needed for nausea or vomiting.   pantoprazole 40 MG tablet Commonly known as: Protonix Take 1 tablet (40 mg total) by mouth 2 (two) times daily before a meal.   PROBIOTIC PO Take 1 capsule by mouth daily.   promethazine 25 MG tablet Commonly known as: PHENERGAN Take 25 mg by mouth every 6 (six) hours as needed for nausea or vomiting.   scopolamine 1 MG/3DAYS Commonly known as: TRANSDERM-SCOP Place 1 patch onto the skin every 3 (three)  days.   Suprep Bowel Prep Kit 17.5-3.13-1.6 GM/177ML Soln Generic drug: Na Sulfate-K Sulfate-Mg Sulf Take 1 kit by mouth as directed.   traMADol 50 MG tablet Commonly known as: ULTRAM Take 1 tablet (50 mg total) by mouth every 6 (six) hours as needed.   VITAMIN E PO Take 1 tablet by mouth See admin instructions. 2-3 times a month       Follow-up Information    Lattie Haw, MD. Schedule an appointment as soon as possible for a visit in 1 week(s).   Specialty: Family Medicine Contact information: [address] [address] [phone]              Allergies  Allergen Reactions  . Other     Propofol - Anesthesia for MRI--made her "breathe funny"  . Propofol Other (See Comments)    "It makes me feel like I'm on fire."    Consultations:  Gi  IR  PALLIATIVE CARE     Procedures/Studies: CT CHEST W CONTRAST  Result Date: 02/17/2020 CLINICAL DATA:  Intrahepatic cholangiocarcinoma. Restaging examination. EXAM: CT CHEST, ABDOMEN, AND PELVIS WITH CONTRAST TECHNIQUE: Multidetector CT imaging of the chest, abdomen and pelvis was performed following the standard protocol during bolus administration of intravenous contrast. CONTRAST:  159m OMNIPAQUE IOHEXOL 300 MG/ML  SOLN COMPARISON:  CT abdomen pelvis 01/14/2020 and 01/11/2020, CT chest 07/29/2017 FINDINGS: CT CHEST FINDINGS Cardiovascular: Moderate multi-vessel coronary artery calcification. Global cardiac size within normal limits. Lipomatous hypertrophy of the intra-atrial septum noted. Mild thinning of the left ventricular apex suggests remote myocardial infarction or chronic ischemia in this location. No pericardial effusion. The central pulmonary arteries are enlarged suggesting changes of pulmonary arterial hypertension. Extensive atherosclerotic calcification noted within the thoracic aorta. The thoracic aorta is mildly dilated measuring 4.0 cm in greatest dimension within the ascending segment and 3.2 cm  in greatest dimension within the arch. The thoracic aorta demonstrates a more normal caliber at the level of the left atrium measuring 2.5 cm in dimension. Right internal jugular chest port catheter tip noted within the superior cavoatrial junction. Mediastinum/Nodes: Thyroid unremarkable. No pathologic thoracic adenopathy. Mild circumferential thickening of the distal esophageal wall may reflect changes of mild esophagitis, as can be seen with reflux esophagitis. The esophagus is otherwise unremarkable. Lungs/Pleura: Mild emphysema again noted. Bibasilar atelectasis is noted. 5 mm subpleural pulmonary nodule within the right middle lobe, axial image # 90/4, is stable since remote prior examination and is safely considered benign given its stability over time. No new focal pulmonary nodule or infiltrate. No pneumothorax or pleural effusion. Central airways are widely patent. Musculoskeletal: Osseous structures are diffusely osteopenic. Advanced degenerative changes are seen  within the thoracic spine. Flowing osteophytes throughout the thoracic spine are in keeping with changes of diffuse idiopathic skeletal hyperostosis. No focal lytic or blastic bone lesions are identified. CT ABDOMEN PELVIS FINDINGS Hepatobiliary: Surgical changes of left hepatectomy are again identified. Multiple enhancing masses are again seen scattered throughout the residual right hepatic lobe in keeping with recurrent cholangiocarcinoma. Exact measurement is difficult as many of these variably enhancing masses form irregular conglomerates, however, these appear grossly stable since prior examination. By example: Conglomerate within the superior right hepatic lobe, axial image # 55/2, measures 6.1 x 6.5 cm (previously measuring 6.0 x 7.6 cm). Conglomerate within the inferior right hepatic lobe, axial image # 74/2, measures 4.5 x 7.0 cm (previously measuring 4.3 x 7.4 cm). Focal enhancing mass within the right hepatic lobe, axial image # 70/2,  measures 2.6 x 3.2 cm (previously measuring 2.5 x 2.8 cm). No intrahepatic biliary ductal dilation. Status post cholecystectomy. The extrahepatic bile duct is not dilated. Thrombosis of the portal vein is again identified Pancreas: Unremarkable Spleen: Unremarkable Adrenals/Urinary Tract: Adrenal glands are unremarkable. Kidneys are normal, without renal calculi, focal lesion, or hydronephrosis. Bladder is unremarkable. Stomach/Bowel: There is circumferential mural thickening and edema involving the distal body of the stomach and antrum, similar to that noted a on prior examination, possibly reflecting changes of portal gastropathy given chronic occlusion of the portal vein. Alternatively, this can be seen in caustic or infectious gastritis. No evidence of obstruction or perforation. Multiple pedunculated lipomas are again seen within the colon measuring 14 mm within the distal transverse colon and 3.0 cm within the sigmoid colon. The small and large bowel are otherwise unremarkable. The appendix is unremarkable. Trace ascites is present, similar to prior examination. No free intraperitoneal gas. Vascular/Lymphatic: Extensive aortoiliac atherosclerotic calcification. No aortic aneurysm. Shotty, enhancing left periaortic lymphadenopathy is stable, measuring 11 mm on axial image # 69/2. No frankly pathologic adenopathy within the abdomen and pelvis. Reproductive: Status post hysterectomy. No adnexal masses. Other: Rectum unremarkable Musculoskeletal: Advanced degenerative changes are seen within the lumbar spine. No lytic or blastic bone lesions are seen. IMPRESSION: Stable disease. Status post left hepatectomy with multiple recurrent conglomerate enhancing masses within the residual right hepatic lobe. No intrahepatic biliary ductal dilation. Malignant masses replace roughly 50% of the liver parenchyma. Chronic thrombosis of the portal vein. Stable thickening and edema of the distal body of the stomach and gastric  antrum possibly representing changes of portal gastropathy. Note that caustic or infectious gastritis could appear similarly and correlation with endoscopy may be helpful for definitive evaluation. No evidence of pulmonary metastatic disease. Moderate multi-vessel coronary artery calcification. Mild dilation of the thoracic aorta. Further follow-up is dependent upon the patient's prognosis related to their underlying malignancy. This can be reassessed on subsequent surveillance imaging for the patient's primary malignancy. Aortic Atherosclerosis (ICD10-I70.0) and Emphysema (ICD10-J43.9). Aortic aneurysm NOS (ICD10-I71.9). Electronically Signed   By: Fidela Salisbury MD   On: 02/17/2020 20:41   CT ABDOMEN PELVIS W CONTRAST  Result Date: 02/17/2020 CLINICAL DATA:  Intrahepatic cholangiocarcinoma. Restaging examination. EXAM: CT CHEST, ABDOMEN, AND PELVIS WITH CONTRAST TECHNIQUE: Multidetector CT imaging of the chest, abdomen and pelvis was performed following the standard protocol during bolus administration of intravenous contrast. CONTRAST:  153m OMNIPAQUE IOHEXOL 300 MG/ML  SOLN COMPARISON:  CT abdomen pelvis 01/14/2020 and 01/11/2020, CT chest 07/29/2017 FINDINGS: CT CHEST FINDINGS Cardiovascular: Moderate multi-vessel coronary artery calcification. Global cardiac size within normal limits. Lipomatous hypertrophy of the intra-atrial septum noted. Mild thinning of  the left ventricular apex suggests remote myocardial infarction or chronic ischemia in this location. No pericardial effusion. The central pulmonary arteries are enlarged suggesting changes of pulmonary arterial hypertension. Extensive atherosclerotic calcification noted within the thoracic aorta. The thoracic aorta is mildly dilated measuring 4.0 cm in greatest dimension within the ascending segment and 3.2 cm in greatest dimension within the arch. The thoracic aorta demonstrates a more normal caliber at the level of the left atrium measuring 2.5 cm  in dimension. Right internal jugular chest port catheter tip noted within the superior cavoatrial junction. Mediastinum/Nodes: Thyroid unremarkable. No pathologic thoracic adenopathy. Mild circumferential thickening of the distal esophageal wall may reflect changes of mild esophagitis, as can be seen with reflux esophagitis. The esophagus is otherwise unremarkable. Lungs/Pleura: Mild emphysema again noted. Bibasilar atelectasis is noted. 5 mm subpleural pulmonary nodule within the right middle lobe, axial image # 90/4, is stable since remote prior examination and is safely considered benign given its stability over time. No new focal pulmonary nodule or infiltrate. No pneumothorax or pleural effusion. Central airways are widely patent. Musculoskeletal: Osseous structures are diffusely osteopenic. Advanced degenerative changes are seen within the thoracic spine. Flowing osteophytes throughout the thoracic spine are in keeping with changes of diffuse idiopathic skeletal hyperostosis. No focal lytic or blastic bone lesions are identified. CT ABDOMEN PELVIS FINDINGS Hepatobiliary: Surgical changes of left hepatectomy are again identified. Multiple enhancing masses are again seen scattered throughout the residual right hepatic lobe in keeping with recurrent cholangiocarcinoma. Exact measurement is difficult as many of these variably enhancing masses form irregular conglomerates, however, these appear grossly stable since prior examination. By example: Conglomerate within the superior right hepatic lobe, axial image # 55/2, measures 6.1 x 6.5 cm (previously measuring 6.0 x 7.6 cm). Conglomerate within the inferior right hepatic lobe, axial image # 74/2, measures 4.5 x 7.0 cm (previously measuring 4.3 x 7.4 cm). Focal enhancing mass within the right hepatic lobe, axial image # 70/2, measures 2.6 x 3.2 cm (previously measuring 2.5 x 2.8 cm). No intrahepatic biliary ductal dilation. Status post cholecystectomy. The  extrahepatic bile duct is not dilated. Thrombosis of the portal vein is again identified Pancreas: Unremarkable Spleen: Unremarkable Adrenals/Urinary Tract: Adrenal glands are unremarkable. Kidneys are normal, without renal calculi, focal lesion, or hydronephrosis. Bladder is unremarkable. Stomach/Bowel: There is circumferential mural thickening and edema involving the distal body of the stomach and antrum, similar to that noted a on prior examination, possibly reflecting changes of portal gastropathy given chronic occlusion of the portal vein. Alternatively, this can be seen in caustic or infectious gastritis. No evidence of obstruction or perforation. Multiple pedunculated lipomas are again seen within the colon measuring 14 mm within the distal transverse colon and 3.0 cm within the sigmoid colon. The small and large bowel are otherwise unremarkable. The appendix is unremarkable. Trace ascites is present, similar to prior examination. No free intraperitoneal gas. Vascular/Lymphatic: Extensive aortoiliac atherosclerotic calcification. No aortic aneurysm. Shotty, enhancing left periaortic lymphadenopathy is stable, measuring 11 mm on axial image # 69/2. No frankly pathologic adenopathy within the abdomen and pelvis. Reproductive: Status post hysterectomy. No adnexal masses. Other: Rectum unremarkable Musculoskeletal: Advanced degenerative changes are seen within the lumbar spine. No lytic or blastic bone lesions are seen. IMPRESSION: Stable disease. Status post left hepatectomy with multiple recurrent conglomerate enhancing masses within the residual right hepatic lobe. No intrahepatic biliary ductal dilation. Malignant masses replace roughly 50% of the liver parenchyma. Chronic thrombosis of the portal vein. Stable thickening and edema of  the distal body of the stomach and gastric antrum possibly representing changes of portal gastropathy. Note that caustic or infectious gastritis could appear similarly and  correlation with endoscopy may be helpful for definitive evaluation. No evidence of pulmonary metastatic disease. Moderate multi-vessel coronary artery calcification. Mild dilation of the thoracic aorta. Further follow-up is dependent upon the patient's prognosis related to their underlying malignancy. This can be reassessed on subsequent surveillance imaging for the patient's primary malignancy. Aortic Atherosclerosis (ICD10-I70.0) and Emphysema (ICD10-J43.9). Aortic aneurysm NOS (ICD10-I71.9). Electronically Signed   By: Fidela Salisbury MD   On: 02/17/2020 20:41   IR GASTROSTOMY TUBE MOD SED  Result Date: 02/21/2020 INDICATION: History of metastatic cholangiocarcinoma now with persistent nausea and vomiting concern for malignant gastric outlet obstruction. Request made for placement of gastrojejunostomy tube for venting and feeding purposes. EXAM: PULL TROUGH GASTROSTOMY TUBE PLACEMENT COMPARISON:  CT the chest, abdomen pelvis-02/17/2020 MEDICATIONS: Ancef 2 gm IV; Antibiotics were administered within 1 hour of the procedure. Phenergan 6.25 mg IV CONTRAST:  70 mL of Omnipaque 300 administered into the gastric lumen. ANESTHESIA/SEDATION: Moderate (conscious) sedation was employed during this procedure. A total of Versed 2 mg and Fentanyl 50 mcg was administered intravenously. Moderate Sedation Time: 36 minutes. The patient's level of consciousness and vital signs were monitored continuously by radiology nursing throughout the procedure under my direct supervision. FLUOROSCOPY TIME:  17 minutes, 42 seconds (4 in 8 mGy) COMPLICATIONS: None immediate. PROCEDURE: Informed written consent was obtained from the patient following explanation of the procedure, risks, benefits and alternatives. A time out was performed prior to the initiation of the procedure. Ultrasound scanning was performed to demarcate the edge of the liver. Maximal barrier sterile technique utilized including caps, mask, sterile gowns, sterile  gloves, large sterile drape, hand hygiene and Betadine prep. The left upper quadrant was sterilely prepped and draped. An oral gastric catheter was inserted into the stomach under fluoroscopy. The existing nasogastric feeding tube was removed. The left costal margin and air opacified transverse colon were identified and avoided. Air was injected into the stomach for insufflation and visualization under fluoroscopy. Under sterile conditions a 17 gauge trocar needle was utilized to access the stomach percutaneously beneath the left subcostal margin after the overlying soft tissues were anesthetized with 1% Lidocaine with epinephrine. Note, access was directed towards the gastric antrum given plan to attempt placement of a jejunostomy lumen. Needle position was confirmed within the stomach with aspiration of air and injection of small amount of contrast. A single T tack was deployed for gastropexy. Over an Amplatz guide wire, a 9-French sheath was inserted into the stomach. A snare device was utilized to capture the oral gastric catheter. The snare device was pulled retrograde from the stomach up the esophagus and out the oropharynx. The 20-French pull-through gastrostomy was connected to the snare device and pulled antegrade through the oropharynx down the esophagus into the stomach and then through the percutaneous tract external to the patient. Contrast was injected further appropriate position functionality. Next, the external portion of the gastrostomy tube was cannulated with initially a Kumpe catheter and ultimately a C2 catheter and despite prolonged efforts, a wire could not be advanced through the gastric antrum to the level of the proximal duodenum. As such the decision was made to terminate the procedure. The gastrostomy was assembled externally. Contrast injection confirms position in the stomach. Several spot radiographic images were obtained in various obliquities for documentation. The patient tolerated  procedure well without immediate post procedural complication.  FINDINGS: After successful fluoroscopic guided placement, the gastrostomy tube is appropriately positioned with internal disc against the ventral aspect of the gastric lumen. Despite prolonged efforts, a wire could not be advanced through the gastric antrum presumably secondary to malignant obstruction. IMPRESSION: Successful fluoroscopic insertion of a 20-French pull-through gastrostomy tube. The gastrostomy may be used immediately for venting purposes. PLAN: Patient may return for repeat attempt at conversion of gastrostomy tube to a GJ tube later this week as indicated. If this repeat attempt is also unsuccessful, consideration could be made for endoscopic conversion as indicated. Electronically Signed   By: Sandi Mariscal M.D.   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 Request made for placement of gastrojejunostomy tube for venting and feeding purposes. EXAM: PULL TROUGH GASTROSTOMY TUBE PLACEMENT COMPARISON:  CT the chest, abdomen pelvis-02/17/2020 MEDICATIONS: Ancef 2 gm IV; Antibiotics were administered within 1 hour of the procedure. Phenergan 6.25 mg IV CONTRAST:  70 mL of Omnipaque 300 administered into the gastric lumen. ANESTHESIA/SEDATION: Moderate (conscious) sedation was employed during this procedure. A total of Versed 2 mg and Fentanyl 50 mcg was administered intravenously. Moderate Sedation Time: 36 minutes. The patient's level of consciousness and vital signs were monitored continuously by radiology nursing throughout the procedure under my direct supervision. FLUOROSCOPY TIME:  17 minutes, 42 seconds (4 in 8 mGy) COMPLICATIONS: None immediate. PROCEDURE: Informed written consent was obtained from the patient following explanation of the  procedure, risks, benefits and alternatives. A time out was performed prior to the initiation of the procedure. Ultrasound scanning was performed to demarcate the edge of the liver. Maximal barrier sterile technique utilized including caps, mask, sterile gowns, sterile gloves, large sterile drape, hand hygiene and Betadine prep. The left upper quadrant was sterilely prepped and draped. An oral gastric catheter was inserted into the stomach under fluoroscopy. The existing nasogastric feeding tube was removed. The left costal margin and air opacified transverse colon were identified and avoided. Air was injected into the stomach for insufflation and visualization under fluoroscopy. Under sterile conditions a 17 gauge trocar needle was utilized to access the stomach percutaneously beneath the left subcostal margin after the overlying soft tissues were anesthetized with 1% Lidocaine with epinephrine. Note, access was directed towards the gastric antrum given plan to attempt placement of a jejunostomy lumen. Needle position was confirmed within the stomach with aspiration of air and injection of small amount of contrast. A single T tack was deployed for gastropexy. Over an Amplatz guide wire, a 9-French sheath was inserted into the stomach. A snare device was utilized to capture the oral gastric catheter. The snare device was pulled retrograde from the stomach up the esophagus and out the oropharynx. The 20-French pull-through gastrostomy was connected to the snare device and pulled antegrade through the oropharynx down the esophagus into the stomach and then through the percutaneous tract external to the patient. Contrast was injected further appropriate position functionality. Next, the external portion of the gastrostomy tube was cannulated with initially a Kumpe catheter and ultimately a C2 catheter and despite prolonged efforts, a wire could not be advanced through the gastric antrum to the level of the proximal  duodenum. As such the decision was made to terminate the procedure. The gastrostomy was assembled externally. Contrast injection confirms position in the stomach. Several spot radiographic images were obtained in various  obliquities for documentation. The patient tolerated procedure well without immediate post procedural complication. FINDINGS: After successful fluoroscopic guided placement, the gastrostomy tube is appropriately positioned with internal disc against the ventral aspect of the gastric lumen. Despite prolonged efforts, a wire could not be advanced through the gastric antrum presumably secondary to malignant obstruction. IMPRESSION: Successful fluoroscopic insertion of a 20-French pull-through gastrostomy tube. The gastrostomy may be used immediately for venting purposes. PLAN: Patient may return for repeat attempt at conversion of gastrostomy tube to a GJ tube later this week as indicated. If this repeat attempt is also unsuccessful, consideration could be made for endoscopic conversion as indicated. Electronically Signed   By: Sandi Mariscal M.D.   On: 02/21/2020 16:44   DG Abd Portable 1V  Result Date: 02/15/2020 CLINICAL DATA:  Cholangiocarcinoma, nausea, vomiting EXAM: PORTABLE ABDOMEN - 1 VIEW COMPARISON:  Portable exam 1138 hours compared to 01/14/2020 FINDINGS: Nonobstructive bowel gas pattern. Paucity of bowel gas in the upper abdomen. No bowel wall thickening or dilatation. Degenerative disc disease changes lumbar spine. Surgical clips in upper to mid abdomen. IMPRESSION: No acute abnormalities. Electronically Signed   By: Lavonia Dana M.D.   On: 02/15/2020 12:19       Subjective:  Reports her nausea has improved, no vomiting, wants to go home and slowly advance diet as tolerated.  Discharge Exam: Vitals:   02/22/20 2036 02/23/20 0343  BP: (!) 152/69 (!) 144/70  Pulse: 71 79  Resp: 12 15  Temp: 98.3 F (36.8 C) 98.4 F (36.9 C)  SpO2: 99% 96%   Vitals:   02/22/20 1405  02/22/20 1608 02/22/20 2036 02/23/20 0343  BP: (!) 181/68 (!) 172/70 (!) 152/69 (!) 144/70  Pulse: 79 72 71 79  Resp: 14  12 15   Temp: 97.8 F (36.6 C)  98.3 F (36.8 C) 98.4 F (36.9 C)  TempSrc: Oral  Oral Oral  SpO2: 99% 99% 99% 96%  Weight:      Height:        General: Pt is alert, awake, not in acute distress Cardiovascular: RRR, S1/S2 +, no rubs, no gallops Respiratory: CTA bilaterally, no wheezing, no rhonchi Abdominal: Soft, NT, ND, bowel sounds + Extremities: no edema, no cyanosis    The results of significant diagnostics from this hospitalization (including imaging, microbiology, ancillary and laboratory) are listed below for reference.     Microbiology: Recent Results (from the past 240 hour(s))  Respiratory Panel by RT PCR (Flu A&B, Covid) - Nasopharyngeal Swab     Status: None   Collection Time: 02/14/20  3:45 PM   Specimen: Nasopharyngeal Swab  Result Value Ref Range Status   SARS Coronavirus 2 by RT PCR NEGATIVE NEGATIVE Final    Comment: (NOTE) SARS-CoV-2 target nucleic acids are NOT DETECTED.  The SARS-CoV-2 RNA is generally detectable in upper respiratoy specimens during the acute phase of infection. The lowest concentration of SARS-CoV-2 viral copies this assay can detect is 131 copies/mL. A negative result does not preclude SARS-Cov-2 infection and should not be used as the sole basis for treatment or other patient management decisions. A negative result may occur with  improper specimen collection/handling, submission of specimen other than nasopharyngeal swab, presence of viral mutation(s) within the areas targeted by this assay, and inadequate number of viral copies (<131 copies/mL). A negative result must be combined with clinical observations, patient history, and epidemiological information. The expected result is Negative.  Fact Sheet for Patients:  PinkCheek.be  Fact Sheet for Healthcare Providers:  GravelBags.it  This test is no t yet approved or cleared by the [place] and  has been authorized for detection and/or diagnosis of SARS-CoV-2 by FDA under an Emergency Use Authorization (EUA). This EUA will remain  in effect (meaning this test can be used) for the duration of the COVID-19 declaration under Section 564(b)(1) of the Act, 21 U.S.C. section 360bbb-3(b)(1), unless the authorization is terminated or revoked sooner.     Influenza A by PCR NEGATIVE NEGATIVE Final   Influenza B by PCR NEGATIVE NEGATIVE Final    Comment: (NOTE) The Xpert Xpress SARS-CoV-2/FLU/RSV assay is intended as an aid in  the diagnosis of influenza from Nasopharyngeal swab specimens and  should not be used as a sole basis for treatment. Nasal washings and  aspirates are unacceptable for Xpert Xpress SARS-CoV-2/FLU/RSV  testing.  Fact Sheet for Patients: PinkCheek.be  Fact Sheet for Healthcare Providers: GravelBags.it  This test is not yet approved or cleared by the [place] FDA and  has been authorized for detection and/or diagnosis of SARS-CoV-2 by  FDA under an Emergency Use Authorization (EUA). This EUA will remain  in effect (meaning this test can be used) for the duration of the  Covid-19 declaration under Section 564(b)(1) of the Act, 21  U.S.C. section 360bbb-3(b)(1), unless the authorization is  terminated or revoked. Performed at [facility], [place] [address], [place], [place] [address]      Labs: BNP (last 3 results) No results for input(s): BNP in the last 8760 hours. Basic Metabolic Panel: Recent Labs  Lab 02/17/20 0612 02/17/20 0612 02/18/20 0405 02/19/20 0529 02/20/20 0522 02/22/20 0425 02/23/20 0504  NA 138   < > 140 140 143 141 138  K 4.3   < > 4.0 4.3 4.4 4.0 3.9  CL 104   < > 106 105 110 109 107  CO2 28   < > 26 24 26 31 26   GLUCOSE 170*   < >  165* 139* 120* 129* 96  BUN 8   < > 6* 7* 9 9 10   CREATININE 0.60   < > 0.50 0.66 0.63 0.57 0.56  CALCIUM 8.5*   < > 8.8* 9.1 9.1 8.8* 8.6*  MG 1.8  --   --   --   --   --   --   PHOS 2.2*  --   --   --   --   --   --    < > = values in this interval not displayed.   Liver Function Tests: Recent Labs  Lab 02/17/20 0612 02/18/20 0405 02/19/20 0529 02/20/20 0522 02/23/20 0504  AST 38 31 31 26  32  ALT 21 21 20 17 16   ALKPHOS 75 77 77 68 75  BILITOT 1.0 0.6 0.8 0.5 0.6  PROT 6.2* 6.3* 6.5 5.7* 5.3*  ALBUMIN 2.5* 2.5* 2.9* 2.4* 2.3*   No results for input(s): LIPASE, AMYLASE in the last 168 hours. No results for input(s): AMMONIA in the last 168 hours. CBC: Recent Labs  Lab 02/18/20 0405 02/19/20 0529 02/20/20 0522 02/22/20 0425 02/23/20 0504  WBC 7.2 9.9 5.2 4.8 4.8  HGB 11.5* 11.9* 10.5* 10.2* 9.9*  HCT 36.5 37.1 34.4* 33.2* 31.6*  MCV 92.2 92.8 94.5 95.4 93.8  PLT 114* 139* 111* 98* 86*   Cardiac Enzymes: No results for input(s): CKTOTAL, CKMB, CKMBINDEX, TROPONINI in the last 168 hours. BNP: Invalid input(s): POCBNP CBG: Recent Labs  Lab 02/21/20 2148 02/22/20 0656 02/22/20 1118  02/22/20 1604 02/23/20 0721  GLUCAP 105* 112* 117* 132* 91   D-Dimer No results for input(s): DDIMER in the last 72 hours. Hgb A1c No results for input(s): HGBA1C in the last 72 hours. Lipid Profile No results for input(s): CHOL, HDL, LDLCALC, TRIG, CHOLHDL, LDLDIRECT in the last 72 hours. Thyroid function studies No results for input(s): TSH, T4TOTAL, T3FREE, THYROIDAB in the last 72 hours.  Invalid input(s): FREET3 Anemia work up No results for input(s): VITAMINB12, FOLATE, FERRITIN, TIBC, IRON, RETICCTPCT in the last 72 hours. Urinalysis    Component Value Date/Time   COLORURINE AMBER (A) 02/14/2020 1112   APPEARANCEUR HAZY (A) 02/14/2020 1112   LABSPEC 1.027 02/14/2020 1112   PHURINE 5.0 02/14/2020 1112   GLUCOSEU NEGATIVE 02/14/2020 1112   HGBUR NEGATIVE 02/14/2020  1112   BILIRUBINUR NEGATIVE 02/14/2020 1112   BILIRUBINUR negative 01/04/2013 1530   KETONESUR 5 (A) 02/14/2020 1112   PROTEINUR 100 (A) 02/14/2020 1112   UROBILINOGEN 0.2 10/09/2013 2037   NITRITE NEGATIVE 02/14/2020 1112   LEUKOCYTESUR NEGATIVE 02/14/2020 1112   Sepsis Labs Invalid input(s): PROCALCITONIN,  WBC,  LACTICIDVEN Microbiology Recent Results (from the past 240 hour(s))  Respiratory Panel by RT PCR (Flu A&B, Covid) - Nasopharyngeal Swab     Status: None   Collection Time: 02/14/20  3:45 PM   Specimen: Nasopharyngeal Swab  Result Value Ref Range Status   SARS Coronavirus 2 by RT PCR NEGATIVE NEGATIVE Final    Comment: (NOTE) SARS-CoV-2 target nucleic acids are NOT DETECTED.  The SARS-CoV-2 RNA is generally detectable in upper respiratoy specimens during the acute phase of infection. The lowest concentration of SARS-CoV-2 viral copies this assay can detect is 131 copies/mL. A negative result does not preclude SARS-Cov-2 infection and should not be used as the sole basis for treatment or other patient management decisions. A negative result may occur with  improper specimen collection/handling, submission of specimen other than nasopharyngeal swab, presence of viral mutation(s) within the areas targeted by this assay, and inadequate number of viral copies (<131 copies/mL). A negative result must be combined with clinical observations, patient history, and epidemiological information. The expected result is Negative.  Fact Sheet for Patients:  PinkCheek.be  Fact Sheet for Healthcare Providers:  GravelBags.it  This test is no t yet approved or cleared by the [place] FDA and  has been authorized for detection and/or diagnosis of SARS-CoV-2 by FDA under an Emergency Use Authorization (EUA). This EUA will remain  in effect (meaning this test can be used) for the duration of the COVID-19 declaration under  Section 564(b)(1) of the Act, 21 U.S.C. section 360bbb-3(b)(1), unless the authorization is terminated or revoked sooner.     Influenza A by PCR NEGATIVE NEGATIVE Final   Influenza B by PCR NEGATIVE NEGATIVE Final    Comment: (NOTE) The Xpert Xpress SARS-CoV-2/FLU/RSV assay is intended as an aid in  the diagnosis of influenza from Nasopharyngeal swab specimens and  should not be used as a sole basis for treatment. Nasal washings and  aspirates are unacceptable for Xpert Xpress SARS-CoV-2/FLU/RSV  testing.  Fact Sheet for Patients: PinkCheek.be  Fact Sheet for Healthcare Providers: GravelBags.it  This test is not yet approved or cleared by the [place] FDA and  has been authorized for detection and/or diagnosis of SARS-CoV-2 by  FDA under an Emergency Use Authorization (EUA). This EUA will remain  in effect (meaning this test can be used) for the duration of the  Covid-19 declaration under Section 564(b)(1) of  the Act, 21  U.S.C. section 360bbb-3(b)(1), unless the authorization is  terminated or revoked. Performed at [facility], [place] [address] [place], [place] [address]      Time coordinating discharge:33 min  SIGNED:   Hosie Poisson, MD  [facility] 02/23/2020, 10:01 AM

## 2020-02-23 NOTE — Progress Notes (Signed)
Patient c/o nausea and acid reflux. New orders to administer PRN Maalox and Zofran. Will continue to monitor the patient.

## 2020-02-25 ENCOUNTER — Encounter: Payer: Self-pay | Admitting: Family Medicine

## 2020-02-25 ENCOUNTER — Ambulatory Visit (INDEPENDENT_AMBULATORY_CARE_PROVIDER_SITE_OTHER): Payer: Medicare HMO | Admitting: Family Medicine

## 2020-02-25 ENCOUNTER — Other Ambulatory Visit: Payer: Self-pay

## 2020-02-25 VITALS — BP 120/64 | HR 84 | Ht 63.0 in | Wt 142.8 lb

## 2020-02-25 DIAGNOSIS — Z515 Encounter for palliative care: Secondary | ICD-10-CM

## 2020-02-25 DIAGNOSIS — R112 Nausea with vomiting, unspecified: Secondary | ICD-10-CM

## 2020-02-25 DIAGNOSIS — R600 Localized edema: Secondary | ICD-10-CM | POA: Diagnosis not present

## 2020-02-25 DIAGNOSIS — C221 Intrahepatic bile duct carcinoma: Secondary | ICD-10-CM

## 2020-02-25 NOTE — Patient Instructions (Addendum)
It was nice to meet you today,  I would encourage you to eat by starting out with small simple bland foods that are easy to swallow and digest.  As you tolerate those, you can increase the amount of food you eat in the variety of food you eat.  Remember to use the tube that they placed as needed to relieve your symptoms.  I would call the hospice agency so that the both of you are on the same page regarding how often she has her dressings changed.  Her dressings looked clean today with no signs of infection.  Your swelling is probably due to the malnutrition you are experiencing from the nausea and vomiting.  Now that your nausea has improved, increasing the amount of protein you eat in the form of solid foods can help with the swelling over time.  Have a great day,  Clemetine Marker, MD

## 2020-02-25 NOTE — Assessment & Plan Note (Signed)
Patient says that her nausea has improved since the procedure.  She is still afraid of eating solid foods.  She is drinking fluids including water and protein shakes.  Encouraged the patient to start with small amounts of bland, easy to digest foods and increase from there.

## 2020-02-25 NOTE — Progress Notes (Deleted)
She is afraid to eat food because of the previous vomiting.    Swelling in hr legs. Gassy.  She has been with hospice since

## 2020-02-25 NOTE — Progress Notes (Signed)
    SUBJECTIVE:   CHIEF COMPLAINT / HPI:   Patient is concerned about eating.  She is afraid that she will start vomiting again if she eats.  She states that her nausea is resolved now that she has the procedure for the venting G-tube.  Patient would like to be able to eat solid foods again by the time it is Thanksgiving.  Patient is concerned about the swelling in her legs.  We discussed the causes of swelling in her legs including decreased albumin from malnutrition.  The patient has home hospice care.  I encouraged the patient to talk with home hospice regarding how often her dressing should be changed.  PERTINENT  PMH / PSH: Metastatic cholangiocarcinoma  OBJECTIVE:   BP 120/64   Pulse 84   Ht 5' 3"  (1.6 m)   Wt 142 lb 12.8 oz (64.8 kg)   SpO2 96%   BMI 25.30 kg/m   Gen: alert. Oriented.  In a wheelchair. Accompanied by daughter.   CV: RRR. No murmurs.   Pulm: LCTAB GI: pt has G tube placed.  No signs of infections.  Dressings clean.  No discharge or drainage.   Ext: 2+ edema of the legs bilaterally.   ASSESSMENT/PLAN:   Intractable nausea and vomiting Patient says that her nausea has improved since the procedure.  She is still afraid of eating solid foods.  She is drinking fluids including water and protein shakes.  Encouraged the patient to start with small amounts of bland, easy to digest foods and increase from there.  Hospice care patient Will not change any medications today given pt is on hopsice care.  Encouraged pt to clarify with hospice how often she needs to change her dressings.       Benay Pike, MD [place]

## 2020-02-26 LAB — CBC
Hematocrit: 37.7 % (ref 34.0–46.6)
Hemoglobin: 12.1 g/dL (ref 11.1–15.9)
MCH: 28.7 pg (ref 26.6–33.0)
MCHC: 32.1 g/dL (ref 31.5–35.7)
MCV: 89 fL (ref 79–97)
Platelets: 120 10*3/uL — ABNORMAL LOW (ref 150–450)
RBC: 4.22 x10E6/uL (ref 3.77–5.28)
RDW: 14 % (ref 11.7–15.4)
WBC: 5.6 10*3/uL (ref 3.4–10.8)

## 2020-02-26 LAB — BASIC METABOLIC PANEL
BUN/Creatinine Ratio: 15 (ref 12–28)
BUN: 10 mg/dL (ref 8–27)
CO2: 23 mmol/L (ref 20–29)
Calcium: 9 mg/dL (ref 8.7–10.3)
Chloride: 101 mmol/L (ref 96–106)
Creatinine, Ser: 0.65 mg/dL (ref 0.57–1.00)
GFR calc Af Amer: 102 mL/min/{1.73_m2} (ref >59)
GFR calc non Af Amer: 88 mL/min/{1.73_m2} (ref >59)
Glucose: 121 mg/dL — ABNORMAL HIGH (ref 65–99)
Potassium: 4.6 mmol/L (ref 3.5–5.2)
Sodium: 138 mmol/L (ref 134–144)

## 2020-02-27 DIAGNOSIS — R600 Localized edema: Secondary | ICD-10-CM | POA: Insufficient documentation

## 2020-02-27 NOTE — Assessment & Plan Note (Signed)
Will not change any medications today given pt is on hopsice care.  Encouraged pt to clarify with hospice how often she needs to change her dressings.

## 2020-04-12 ENCOUNTER — Telehealth: Payer: Self-pay

## 2020-04-12 LAB — SURGICAL PATHOLOGY

## 2020-04-12 NOTE — Telephone Encounter (Signed)
Adaline Sill RN from [facility] left vm requesting recommendation for pain medication.  MS Maranan is having generalized pain prohibiting her from ambulating, sleeping, ADLs.  Oxycodone gives her hallucinations.  Reviewed with Dr Burr Medico, Dr Burr Medico requests [facility] call her on her cell phone.  Message given to operator at [facility] who will page stacey.

## 2020-04-13 ENCOUNTER — Other Ambulatory Visit: Payer: Self-pay | Admitting: Hematology

## 2020-04-13 MED ORDER — DEXAMETHASONE 4 MG PO TABS: 4.0000 mg | ORAL_TABLET | Freq: Two times a day (BID) | ORAL | 0 refills | Status: DC

## 2020-04-13 NOTE — Progress Notes (Signed)
de

## 2020-04-24 ENCOUNTER — Inpatient Hospital Stay (HOSPITAL_COMMUNITY)
Admission: EM | Admit: 2020-04-24 | Discharge: 2020-04-30 | DRG: 393 | Disposition: A | Discharge status: Hospice | Attending: Family Medicine | Admitting: Family Medicine

## 2020-04-24 ENCOUNTER — Encounter (HOSPITAL_COMMUNITY): Payer: Self-pay

## 2020-04-24 ENCOUNTER — Emergency Department (HOSPITAL_COMMUNITY)

## 2020-04-24 ENCOUNTER — Telehealth: Payer: Self-pay | Admitting: *Deleted

## 2020-04-24 DIAGNOSIS — R52 Pain, unspecified: Secondary | ICD-10-CM | POA: Diagnosis not present

## 2020-04-24 DIAGNOSIS — G4733 Obstructive sleep apnea (adult) (pediatric): Secondary | ICD-10-CM | POA: Diagnosis present

## 2020-04-24 DIAGNOSIS — Z833 Family history of diabetes mellitus: Secondary | ICD-10-CM

## 2020-04-24 DIAGNOSIS — Z6821 Body mass index (BMI) 21.0-21.9, adult: Secondary | ICD-10-CM

## 2020-04-24 DIAGNOSIS — R1084 Generalized abdominal pain: Secondary | ICD-10-CM | POA: Diagnosis not present

## 2020-04-24 DIAGNOSIS — J449 Chronic obstructive pulmonary disease, unspecified: Secondary | ICD-10-CM | POA: Diagnosis not present

## 2020-04-24 DIAGNOSIS — Z4682 Encounter for fitting and adjustment of non-vascular catheter: Secondary | ICD-10-CM | POA: Diagnosis not present

## 2020-04-24 DIAGNOSIS — R220 Localized swelling, mass and lump, head: Secondary | ICD-10-CM | POA: Diagnosis not present

## 2020-04-24 DIAGNOSIS — K219 Gastro-esophageal reflux disease without esophagitis: Secondary | ICD-10-CM | POA: Diagnosis present

## 2020-04-24 DIAGNOSIS — R1310 Dysphagia, unspecified: Secondary | ICD-10-CM | POA: Diagnosis present

## 2020-04-24 DIAGNOSIS — Z8505 Personal history of malignant neoplasm of liver: Secondary | ICD-10-CM

## 2020-04-24 DIAGNOSIS — R140 Abdominal distension (gaseous): Secondary | ICD-10-CM | POA: Diagnosis not present

## 2020-04-24 DIAGNOSIS — Z20822 Contact with and (suspected) exposure to covid-19: Secondary | ICD-10-CM | POA: Diagnosis present

## 2020-04-24 DIAGNOSIS — G893 Neoplasm related pain (acute) (chronic): Secondary | ICD-10-CM | POA: Diagnosis present

## 2020-04-24 DIAGNOSIS — C787 Secondary malignant neoplasm of liver and intrahepatic bile duct: Secondary | ICD-10-CM | POA: Diagnosis present

## 2020-04-24 DIAGNOSIS — R109 Unspecified abdominal pain: Secondary | ICD-10-CM

## 2020-04-24 DIAGNOSIS — Z79899 Other long term (current) drug therapy: Secondary | ICD-10-CM

## 2020-04-24 DIAGNOSIS — Z8249 Family history of ischemic heart disease and other diseases of the circulatory system: Secondary | ICD-10-CM

## 2020-04-24 DIAGNOSIS — J90 Pleural effusion, not elsewhere classified: Secondary | ICD-10-CM | POA: Diagnosis not present

## 2020-04-24 DIAGNOSIS — E876 Hypokalemia: Secondary | ICD-10-CM | POA: Diagnosis present

## 2020-04-24 DIAGNOSIS — F411 Generalized anxiety disorder: Secondary | ICD-10-CM | POA: Diagnosis not present

## 2020-04-24 DIAGNOSIS — R0600 Dyspnea, unspecified: Secondary | ICD-10-CM

## 2020-04-24 DIAGNOSIS — C221 Intrahepatic bile duct carcinoma: Secondary | ICD-10-CM | POA: Diagnosis not present

## 2020-04-24 DIAGNOSIS — R5381 Other malaise: Secondary | ICD-10-CM | POA: Diagnosis not present

## 2020-04-24 DIAGNOSIS — Z9109 Other allergy status, other than to drugs and biological substances: Secondary | ICD-10-CM

## 2020-04-24 DIAGNOSIS — Z9221 Personal history of antineoplastic chemotherapy: Secondary | ICD-10-CM

## 2020-04-24 DIAGNOSIS — I10 Essential (primary) hypertension: Secondary | ICD-10-CM | POA: Diagnosis not present

## 2020-04-24 DIAGNOSIS — M1990 Unspecified osteoarthritis, unspecified site: Secondary | ICD-10-CM | POA: Diagnosis present

## 2020-04-24 DIAGNOSIS — Z4659 Encounter for fitting and adjustment of other gastrointestinal appliance and device: Secondary | ICD-10-CM | POA: Diagnosis not present

## 2020-04-24 DIAGNOSIS — J690 Pneumonitis due to inhalation of food and vomit: Secondary | ICD-10-CM | POA: Diagnosis present

## 2020-04-24 DIAGNOSIS — Z7952 Long term (current) use of systemic steroids: Secondary | ICD-10-CM

## 2020-04-24 DIAGNOSIS — R64 Cachexia: Secondary | ICD-10-CM | POA: Diagnosis present

## 2020-04-24 DIAGNOSIS — Z79891 Long term (current) use of opiate analgesic: Secondary | ICD-10-CM

## 2020-04-24 DIAGNOSIS — F32A Depression, unspecified: Secondary | ICD-10-CM | POA: Diagnosis present

## 2020-04-24 DIAGNOSIS — E119 Type 2 diabetes mellitus without complications: Secondary | ICD-10-CM | POA: Diagnosis present

## 2020-04-24 DIAGNOSIS — M109 Gout, unspecified: Secondary | ICD-10-CM | POA: Diagnosis present

## 2020-04-24 DIAGNOSIS — Z7983 Long term (current) use of bisphosphonates: Secondary | ICD-10-CM

## 2020-04-24 DIAGNOSIS — K311 Adult hypertrophic pyloric stenosis: Secondary | ICD-10-CM | POA: Diagnosis not present

## 2020-04-24 DIAGNOSIS — Z90710 Acquired absence of both cervix and uterus: Secondary | ICD-10-CM | POA: Diagnosis not present

## 2020-04-24 DIAGNOSIS — R279 Unspecified lack of coordination: Secondary | ICD-10-CM | POA: Diagnosis not present

## 2020-04-24 DIAGNOSIS — Z515 Encounter for palliative care: Secondary | ICD-10-CM

## 2020-04-24 DIAGNOSIS — K589 Irritable bowel syndrome without diarrhea: Secondary | ICD-10-CM | POA: Diagnosis present

## 2020-04-24 DIAGNOSIS — Z885 Allergy status to narcotic agent status: Secondary | ICD-10-CM

## 2020-04-24 DIAGNOSIS — K9423 Gastrostomy malfunction: Principal | ICD-10-CM | POA: Diagnosis present

## 2020-04-24 DIAGNOSIS — J189 Pneumonia, unspecified organism: Secondary | ICD-10-CM | POA: Diagnosis not present

## 2020-04-24 DIAGNOSIS — Z923 Personal history of irradiation: Secondary | ICD-10-CM

## 2020-04-24 DIAGNOSIS — Z743 Need for continuous supervision: Secondary | ICD-10-CM | POA: Diagnosis not present

## 2020-04-24 DIAGNOSIS — I119 Hypertensive heart disease without heart failure: Secondary | ICD-10-CM | POA: Diagnosis present

## 2020-04-24 DIAGNOSIS — I517 Cardiomegaly: Secondary | ICD-10-CM | POA: Diagnosis not present

## 2020-04-24 DIAGNOSIS — E785 Hyperlipidemia, unspecified: Secondary | ICD-10-CM | POA: Diagnosis present

## 2020-04-24 DIAGNOSIS — R0602 Shortness of breath: Secondary | ICD-10-CM | POA: Diagnosis not present

## 2020-04-24 DIAGNOSIS — Z8261 Family history of arthritis: Secondary | ICD-10-CM

## 2020-04-24 DIAGNOSIS — D696 Thrombocytopenia, unspecified: Secondary | ICD-10-CM | POA: Diagnosis present

## 2020-04-24 DIAGNOSIS — J341 Cyst and mucocele of nose and nasal sinus: Secondary | ICD-10-CM | POA: Diagnosis not present

## 2020-04-24 DIAGNOSIS — R58 Hemorrhage, not elsewhere classified: Secondary | ICD-10-CM | POA: Diagnosis not present

## 2020-04-24 DIAGNOSIS — Z931 Gastrostomy status: Secondary | ICD-10-CM

## 2020-04-24 DIAGNOSIS — Z87891 Personal history of nicotine dependence: Secondary | ICD-10-CM

## 2020-04-24 HISTORY — PX: IR GASTR TUBE CONVERT GASTR-JEJ PER W/FL MOD SED: IMG2332

## 2020-04-24 HISTORY — PX: IR INTRO LONG GI TUBE: IMG2373

## 2020-04-24 LAB — CBC WITH DIFFERENTIAL/PLATELET
Abs Immature Granulocytes: 0.02 10*3/uL (ref 0.00–0.07)
Basophils Absolute: 0 10*3/uL (ref 0.0–0.1)
Basophils Relative: 0 %
Eosinophils Absolute: 0 10*3/uL (ref 0.0–0.5)
Eosinophils Relative: 0 %
HCT: 38.6 % (ref 36.0–46.0)
Hemoglobin: 12.7 g/dL (ref 12.0–15.0)
Immature Granulocytes: 0 %
Lymphocytes Relative: 18 %
Lymphs Abs: 0.9 10*3/uL (ref 0.7–4.0)
MCH: 29.9 pg (ref 26.0–34.0)
MCHC: 32.9 g/dL (ref 30.0–36.0)
MCV: 90.8 fL (ref 80.0–100.0)
Monocytes Absolute: 0.5 10*3/uL (ref 0.1–1.0)
Monocytes Relative: 10 %
Neutro Abs: 3.7 10*3/uL (ref 1.7–7.7)
Neutrophils Relative %: 72 %
Platelets: 117 10*3/uL — ABNORMAL LOW (ref 150–400)
RBC: 4.25 MIL/uL (ref 3.87–5.11)
RDW: 15.6 % — ABNORMAL HIGH (ref 11.5–15.5)
WBC: 5.2 10*3/uL (ref 4.0–10.5)
nRBC: 0 % (ref 0.0–0.2)

## 2020-04-24 LAB — PROTIME-INR
INR: 1.1 (ref 0.8–1.2)
Prothrombin Time: 13.4 seconds (ref 11.4–15.2)

## 2020-04-24 LAB — SARS CORONAVIRUS 2 (TAT 6-24 HRS): SARS Coronavirus 2: NEGATIVE

## 2020-04-24 LAB — COMPREHENSIVE METABOLIC PANEL
ALT: 58 U/L — ABNORMAL HIGH (ref 0–44)
AST: 53 U/L — ABNORMAL HIGH (ref 15–41)
Albumin: 2.8 g/dL — ABNORMAL LOW (ref 3.5–5.0)
Alkaline Phosphatase: 129 U/L — ABNORMAL HIGH (ref 38–126)
Anion gap: 17 — ABNORMAL HIGH (ref 5–15)
BUN: 22 mg/dL (ref 8–23)
CO2: 24 mmol/L (ref 22–32)
Calcium: 8.9 mg/dL (ref 8.9–10.3)
Chloride: 97 mmol/L — ABNORMAL LOW (ref 98–111)
Creatinine, Ser: 0.67 mg/dL (ref 0.44–1.00)
GFR, Estimated: >60 mL/min (ref >60)
Glucose, Bld: 128 mg/dL — ABNORMAL HIGH (ref 70–99)
Potassium: 3.5 mmol/L (ref 3.5–5.1)
Sodium: 138 mmol/L (ref 135–145)
Total Bilirubin: 1 mg/dL (ref 0.3–1.2)
Total Protein: 5.9 g/dL — ABNORMAL LOW (ref 6.5–8.1)

## 2020-04-24 LAB — APTT: aPTT: 27 seconds (ref 24–36)

## 2020-04-24 LAB — CBG MONITORING, ED
Glucose-Capillary: 106 mg/dL — ABNORMAL HIGH (ref 70–99)
Glucose-Capillary: 107 mg/dL — ABNORMAL HIGH (ref 70–99)
Glucose-Capillary: 98 mg/dL (ref 70–99)

## 2020-04-24 LAB — LIPASE, BLOOD: Lipase: 16 U/L (ref 11–51)

## 2020-04-24 MED ORDER — DEXAMETHASONE 4 MG PO TABS: 4.0000 mg | ORAL_TABLET | Freq: Two times a day (BID) | ORAL | Status: DC

## 2020-04-24 MED ORDER — DICYCLOMINE HCL 20 MG PO TABS: 20.0000 mg | ORAL_TABLET | Freq: Two times a day (BID) | ORAL | Status: DC | PRN

## 2020-04-24 MED ORDER — ALBUTEROL SULFATE HFA 108 (90 BASE) MCG/ACT IN AERS: 1.0000 | INHALATION_SPRAY | Freq: Four times a day (QID) | RESPIRATORY_TRACT | Status: DC | PRN

## 2020-04-24 MED ORDER — ONDANSETRON HCL 4 MG/2ML IJ SOLN
4.0000 mg | Freq: Four times a day (QID) | INTRAMUSCULAR | Status: DC | PRN
  Administered 2020-04-25 – 2020-04-30 (×3): 4 mg via INTRAVENOUS
  Filled 2020-04-24 (×3): qty 2

## 2020-04-24 MED ORDER — LORAZEPAM 0.5 MG PO TABS: 0.5000 mg | ORAL_TABLET | Freq: Three times a day (TID) | ORAL | Status: DC | PRN

## 2020-04-24 MED ORDER — FAMOTIDINE 20 MG PO TABS: 20.0000 mg | ORAL_TABLET | Freq: Two times a day (BID) | ORAL | Status: DC | PRN

## 2020-04-24 MED ORDER — LISINOPRIL 20 MG PO TABS: 40.0000 mg | ORAL_TABLET | Freq: Every day | ORAL | Status: DC

## 2020-04-24 MED ORDER — LIDOCAINE VISCOUS HCL 2 % MT SOLN
OROMUCOSAL | Status: AC
  Administered 2020-04-24: 2 mL
  Filled 2020-04-24: qty 15

## 2020-04-24 MED ORDER — SODIUM CHLORIDE 0.9 % IV SOLN: INTRAVENOUS | Status: DC

## 2020-04-24 MED ORDER — ONDANSETRON HCL 4 MG PO TABS: 4.0000 mg | ORAL_TABLET | Freq: Four times a day (QID) | ORAL | Status: DC | PRN

## 2020-04-24 MED ORDER — HYDROMORPHONE HCL 1 MG/ML IJ SOLN
0.5000 mg | Freq: Once | INTRAMUSCULAR | Status: AC
  Administered 2020-04-24: 0.5 mg via INTRAVENOUS
  Filled 2020-04-24: qty 1

## 2020-04-24 MED ORDER — HYDROMORPHONE HCL 2 MG PO TABS
2.0000 mg | ORAL_TABLET | ORAL | Status: DC | PRN
  Filled 2020-04-24: qty 1

## 2020-04-24 MED ORDER — IOHEXOL 300 MG/ML  SOLN
50.0000 mL | Freq: Once | INTRAMUSCULAR | Status: AC | PRN
  Administered 2020-04-24: 25 mL

## 2020-04-24 MED ORDER — SODIUM CHLORIDE 0.9 % IV BOLUS
1000.0000 mL | Freq: Once | INTRAVENOUS | Status: AC
  Administered 2020-04-24: 1000 mL via INTRAVENOUS

## 2020-04-24 MED ORDER — HYDROMORPHONE HCL 2 MG PO TABS: 2.0000 mg | ORAL_TABLET | ORAL | Status: DC | PRN

## 2020-04-24 MED ORDER — INSULIN ASPART 100 UNIT/ML ~~LOC~~ SOLN
0.0000 [IU] | SUBCUTANEOUS | Status: DC
  Administered 2020-04-25 – 2020-04-26 (×4): 2 [IU] via SUBCUTANEOUS
  Administered 2020-04-26: 1 [IU] via SUBCUTANEOUS
  Administered 2020-04-26 – 2020-04-27 (×5): 2 [IU] via SUBCUTANEOUS
  Administered 2020-04-27: 3 [IU] via SUBCUTANEOUS
  Administered 2020-04-27: 1 [IU] via SUBCUTANEOUS
  Administered 2020-04-28: 3 [IU] via SUBCUTANEOUS
  Administered 2020-04-28 (×2): 2 [IU] via SUBCUTANEOUS
  Administered 2020-04-28: 1 [IU] via SUBCUTANEOUS
  Administered 2020-04-28 – 2020-04-29 (×4): 3 [IU] via SUBCUTANEOUS
  Administered 2020-04-29: 2 [IU] via SUBCUTANEOUS
  Administered 2020-04-29 – 2020-04-30 (×7): 3 [IU] via SUBCUTANEOUS
  Filled 2020-04-24: qty 0.09

## 2020-04-24 NOTE — ED Triage Notes (Signed)
Pt arrived via EMS, from home, states pain at G tube site.

## 2020-04-24 NOTE — ED Notes (Signed)
Pt placed on purewick at 65 mmHg.

## 2020-04-24 NOTE — H&P (Addendum)
History and Physical  CORRIN Peterson QQV:[id number] DOB: 1945/06/22 DOA: 04/24/2020  Referring physician: Vivi Peterson, ER physician PCP: Bonnie Haw, MD  Outpatient Specialists: Truitt Merle, Oncology Patient coming from: Home & is able to ambulate with assistance  Chief Complaint: Abdominal pain   HPI: Bonnie Peterson is a 75 y.o. female with medical history significant for metastatic cholangiocarcinoma status post G-tube as well as COPD and obstructive sleep apnea, currently on hospice who presented to the emergency room on 1/17 with worsening abdominal discomfort.  Patient has a G-tube in place and noted change in color of secretions.  She notes drainage from G-tube site for almost a week.  During her last hospitalization over a month ago, she was told that she should no longer eat solid food and taking only liquids.  She has continued to eat solid food however.  Patient complained of pain at her catheter site and says that her pain has been refractory to home medications.  Abdominal x-ray noted no evidence of bowel obstruction.  Interventional radiology was consulted for changing of tube.  Patient underwent successful fluoroscopic guided conversion of G-tube into GJ tube.  She was noted to have such a large amount of retained debris within her stomach, an NG tube was placed as well.  Discussed with interventional radiology who recommended keeping NG tube in for several days while entire contents of stomach debris were removed.  GJ tube accessible and ready for use now.   Review of Systems: Patient seen in the emergency room, prior to tube change. Pt complains of abdominal pain as well as drainage from G-tube.  Also complains of dysphagia.  Mild headache.  Pt denies any vision changes, shortness of breath, wheezing, coughing or chest pain, hematuria or dysuria, focal extremity numbness weakness or pain.  Review of systems are otherwise negative   Past Medical History:  Diagnosis Date  .  Anxiety   . Benign positional vertigo   . Cancer ([place]) 02/09/15   intrahepatic cholangiocarcinoma  . COPD (chronic obstructive pulmonary disease) ([place])   . Depression   . Diabetes mellitus without complication ([place])   . Early cataracts, bilateral 10/13   Optho, Dr Gershon Crane  . Echocardiogram findings abnormal, without diagnosis 10/10   10/10: mild pulm HTN, EF 60-65%, mild LVH, moderate aortic regurg  . GERD (gastroesophageal reflux disease)    I have "acid reflux"  . Gout   . HLD (hyperlipidemia)   . HTN, goal below 130/80   . Irritable bowel syndrome   . Obesity, Class III, BMI 40-49.9 (morbid obesity) ([place])   . Obstructive sleep apnea    wears cpap  . Osteoarthritis (arthritis due to wear and tear of joints)    also gout  . Restrictive lung disease    Past Surgical History:  Procedure Laterality Date  . ABDOMINAL HYSTERECTOMY    . BIOPSY  02/18/2020   Procedure: BIOPSY;  Surgeon: Thornton Park, MD;  Location: [facility];  Service: Gastroenterology;;  . COLONOSCOPY WITH PROPOFOL N/A 08/12/2019   Procedure: COLONOSCOPY WITH PROPOFOL;  Surgeon: [name], MD;  Location: [facility];  Service: Endoscopy;  Laterality: N/A;  . ESOPHAGOGASTRODUODENOSCOPY N/A 06/15/2015   Procedure: ESOPHAGOGASTRODUODENOSCOPY (EGD);  Surgeon: Gatha Mayer, MD;  Location: [facility];  Service: Endoscopy;  Laterality: N/A;  . ESOPHAGOGASTRODUODENOSCOPY (EGD) WITH PROPOFOL N/A 02/18/2020   Procedure: ESOPHAGOGASTRODUODENOSCOPY (EGD) WITH PROPOFOL;  Surgeon: Thornton Park, MD;  Location: [facility];  Service: Gastroenterology;  Laterality: N/A;  . IR GASTR TUBE  CONVERT GASTR-JEJ PER W/FL MOD SED  02/21/2020  . IR GASTR TUBE CONVERT GASTR-JEJ PER W/FL MOD SED  04/24/2020  . IR GASTROSTOMY TUBE MOD SED  02/21/2020  . IR IMAGING GUIDED PORT INSERTION  06/15/2019  . IR INTRO LONG GI TUBE  04/24/2020  . IR REMOVAL TUN ACCESS W/ PORT W/O FL MOD SED  11/11/2016  . LIVER BIOPSY    . OPEN PARTIAL  HEPATECTOMY  Left 02/09/15  . PARTIAL HYSTERECTOMY    . ROTATOR CUFF REPAIR       L rotator cuff repair 11/07-[place] - 12/7/200  . TONSILLECTOMY      Social History:  reports that she quit smoking about 2 years ago. Her smoking use included cigarettes. She has a 24.50 pack-year smoking history. She has never used smokeless tobacco. She reports current alcohol use. She reports current drug use. Drug: Marijuana.  Lives at home with family.  Ambulates with assistance   Allergies  Allergen Reactions  . Propofol Other (See Comments)    "It makes me feel like I'm on fire."    Family History  Problem Relation Age of Onset  . Breast cancer Mother   . Hypertension Mother   . Coronary artery disease Mother   . Diabetes type II Mother   . Rheum arthritis Mother   . Diabetes type II Sister   . Pancreatic cancer Sister       Prior to Admission medications   Medication Sig Start Date End Date Taking? Authorizing Provider  albuterol (PROAIR HFA) 108 (90 Base) MCG/ACT inhaler Inhale 1-2 puffs into the lungs every 6 (six) hours as needed for wheezing or shortness of breath. 07/23/19  Yes Parrett, Tammy S, NP  dexamethasone (DECADRON) 4 MG tablet Take 1 tablet (4 mg total) by mouth 2 (two) times daily with a meal. 04/13/20  Yes Truitt Merle, MD  dicyclomine (BENTYL) 20 MG tablet Take 1 tablet (20 mg total) by mouth 2 (two) times daily as needed for spasms (abdominal pain). Patient taking differently: Take 20 mg by mouth 2 (two) times daily as needed for spasms. 01/11/20  Yes Gareth Morgan, MD  famotidine (PEPCID) 20 MG tablet Take 1 tablet (20 mg total) by mouth 2 (two) times daily. Patient taking differently: Take 20 mg by mouth 2 (two) times daily as needed for heartburn. 06/26/19  Yes Bonnie Haw, MD  fluticasone (FLONASE) 50 MCG/ACT nasal spray Place 2 sprays into both nostrils daily as needed for rhinitis. 04/17/16  Yes Haney, Alyssa A, MD  HYDROmorphone (DILAUDID) 2 MG tablet Take 1 tablet (2 mg  total) by mouth every 4 (four) hours as needed for severe pain. 02/22/20  Yes Gherghe, Vella Redhead, MD  lisinopril (ZESTRIL) 40 MG tablet Take 40 mg by mouth daily.   Yes [provider]  LORazepam (ATIVAN) 0.5 MG tablet Take 0.5 mg by mouth every 8 (eight) hours as needed for anxiety.   Yes [provider]  alendronate (FOSAMAX) 70 MG tablet TAKE 1 TABLET  ONCE A WEEK. Patient not taking: Reported on 04/24/2020 03/02/19   Bonnie Haw, MD  cholecalciferol (VITAMIN D) 1000 units tablet Take 1 tablet (1,000 Units total) by mouth daily. Patient not taking: Reported on 04/24/2020 01/03/17   Bufford Lope, DO  cloNIDine (CATAPRES - DOSED IN MG/24 HR) 0.3 mg/24hr patch Place 1 patch (0.3 mg total) onto the skin once a week. Patient not taking: No sig reported 02/26/20   Caren Griffins, MD  haloperidol (HALDOL) 0.5 MG  tablet Take 1 tablet (0.5 mg total) by mouth every 6 (six) hours as needed for agitation (or nausea). Patient not taking: Reported on 04/24/2020 01/15/20   Micheline Rough, MD  lidocaine-prilocaine (EMLA) cream Apply 1 application topically every 14 (fourteen) days. Every 14 days as needed Patient not taking: Reported on 04/24/2020 06/17/19   Truitt Merle, MD  mirtazapine (REMERON SOL-TAB) 15 MG disintegrating tablet Take 1 tablet (15 mg total) by mouth at bedtime. Patient not taking: Reported on 04/24/2020 01/06/20 01/05/21  Truitt Merle, MD  Na Sulfate-K Sulfate-Mg Sulf (SUPREP BOWEL PREP KIT) 17.5-3.13-1.6 GM/177ML SOLN Take 1 kit by mouth as directed. Patient not taking: No sig reported 07/21/19   [name], MD  ondansetron (ZOFRAN ODT) 8 MG disintegrating tablet Take 1 tablet (8 mg total) by mouth every 8 (eight) hours as needed for nausea or vomiting. Patient not taking: Reported on 04/24/2020 01/12/20   Harle [place]., PA-C  pantoprazole (PROTONIX) 40 MG tablet Take 1 tablet (40 mg total) by mouth 2 (two) times daily before a meal. Patient not taking: No sig reported 02/22/20  02/21/21  Caren Griffins, MD  traMADol (ULTRAM) 50 MG tablet Take 1 tablet (50 mg total) by mouth every 6 (six) hours as needed. Patient not taking: No sig reported 12/20/19   Truitt Merle, MD    Physical Exam: BP (!) 177/49   Pulse (!) 39   Temp 97.9 F (36.6 C)   Resp 16   SpO2 97%   . General: Alert and oriented x3, mild distress secondary to abdominal pain . Eyes: Sclera nonicteric, extraocular movements are intact . ENT: Normocephalic atraumatic, mucous membranes are dry . Neck: Supple, no JVD . Cardiovascular: Regular rate and rhythm, S1-S2 . Respiratory: Clear to auscultation bilaterally . Abdomen: Soft, distended, tender especially at G-tube site, hypoactive bowel sounds . Skin: No skin breaks, tears or lesions . Musculoskeletal: No clubbing or cyanosis, trace pitting edema . Psychiatric: Appropriate, no evidence of psychoses . Neurologic: No focal deficits          Labs on Admission:  Basic Metabolic Panel: Recent Labs  Lab 04/24/20 0932  NA 138  K 3.5  CL 97*  CO2 24  GLUCOSE 128*  BUN 22  CREATININE 0.67  CALCIUM 8.9   Liver Function Tests: Recent Labs  Lab 04/24/20 0932  AST 53*  ALT 58*  ALKPHOS 129*  BILITOT 1.0  PROT 5.9*  ALBUMIN 2.8*   Recent Labs  Lab 04/24/20 0932  LIPASE 16   No results for input(s): AMMONIA in the last 168 hours. CBC: Recent Labs  Lab 04/24/20 0932  WBC 5.2  NEUTROABS 3.7  HGB 12.7  HCT 38.6  MCV 90.8  PLT 117*   Cardiac Enzymes: No results for input(s): CKTOTAL, CKMB, CKMBINDEX, TROPONINI in the last 168 hours.  BNP (last 3 results) No results for input(s): BNP in the last 8760 hours.  ProBNP (last 3 results) No results for input(s): PROBNP in the last 8760 hours.  CBG: No results for input(s): GLUCAP in the last 168 hours.  Radiological Exams on Admission: IR GASTR TUBE CONVERT GASTR-JEJ PER W/FL MOD SED  Result Date: 04/24/2020 INDICATION: History of metastatic cholangiocarcinoma, currently on  hospice. Patient underwent image guided placement of a pull-through venting gastrostomy tube on 02/21/2020 for palliative purposes. Attempt was made at the time of gastrostomy tube placement to convert the gastrostomy tube to a gastrojejunostomy catheter however this was unsuccessful secondary to subtotal occlusion of the gastric  antrum. Patient returns today with complaint of excessive pericatheter leakage and as such presents for fluoroscopic guided gastrostomy tube evaluation and management. EXAM: 1. FLUOROSCOPIC GUIDED CONVERSION OF EXISTING GASTROSTOMY TUBE TO A GASTROJEJUNOSTOMY TUBE 2. FLUOROSCOPIC GUIDED PLACEMENT OF A NASOGASTRIC TUBE. COMPARISON:  Image guided pull-through gastrostomy tube placement-02/21/2020 MEDICATIONS: None. CONTRAST:  5m OMNIPAQUE IOHEXOL 300 MG/ML SOLN administered into the gastric lumen and proximal small bowel FLUOROSCOPY TIME:  22 minutes, 24 seconds (3001mGy) COMPLICATIONS: None. PROCEDURE: Informed written consent was obtained from the patient after a discussion of the risks and benefits. The upper abdomen and the external portion of the existing gastrostomy tube was prepped and draped in the usual sterile fashion, and a sterile drape was applied covering the operative field. Maximum barrier sterile technique with sterile gowns and gloves were used for the procedure. A timeout was performed prior to the initiation of the procedure. The gastrostomy tube was injected with a small amount of contrast demonstrating significant distension the stomach which is noted to be filled with a large amount of debris. Ultimately with prolonged diligent effort, a stiff glidewire was advanced through the subtotal occlusion of the gastric antrum with the use of a short Kumpe catheter. Under imaged fluoroscopic guidance, the Kumpe catheter was exchanged for a coaxial 9-French gastrojejunostomy catheter which was advanced over the stiff Glidewire with end ultimately co terminating within the  proximal jejunum. Contrast injection demonstrated appropriate positioning and functionality of both the gastric and jejunal lumens. Given the significant amount of debris within the stomach as well as patient's presenting complaint of excessive pericatheter leakage, an 18 gauge nasogastric 2 was advanced through the left narrowings to the level of the stomach under intermittent fluoroscopic guidance after the left naris was anesthetized with viscous lidocaine. Postprocedural spot fluoroscopic images were obtained and the procedure was terminated. The nasogastric tube was affixed at the nose with retention tape. Dressings were applied. The patient tolerated procedure well without immediate postprocedural complication. IMPRESSION: 1. Successful fluoroscopic guided conversion of pull-through gastrostomy tube to a gastrojejunostomy catheter with jejunal limb terminating within the proximal jejunum. 2. Successful fluoroscopic guided placement of an 178French nasogastric tube for the purposes of maximum gastric venting. PLAN: - Given the large amount of retained debris within the stomach, recommended the nasogastric tube stay in place for 1-2 days connected to low wall suction to allow for optimal gastric venting. - The jejunal lumen is ready for immediate use for feeding and the gastric lumen may be utilized for venting purposes as indicated. 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 Electronically Signed   By: JSandi MariscalM.D.   On: 04/24/2020 16:35   IR INTRO LONG GI TUBE  Result Date: 04/24/2020 INDICATION: History of metastatic cholangiocarcinoma, currently on hospice. Patient underwent image guided placement of a pull-through venting gastrostomy tube on 02/21/2020 for palliative purposes. Attempt was made at the time of gastrostomy tube placement to convert the gastrostomy tube to a  gastrojejunostomy catheter however this was unsuccessful secondary to subtotal occlusion of the gastric antrum. Patient returns today with complaint of excessive pericatheter leakage and as such presents for fluoroscopic guided gastrostomy tube evaluation and management. EXAM: 1. FLUOROSCOPIC GUIDED CONVERSION OF EXISTING GASTROSTOMY TUBE TO A GASTROJEJUNOSTOMY TUBE 2. FLUOROSCOPIC GUIDED PLACEMENT OF A NASOGASTRIC TUBE. COMPARISON:  Image guided pull-through gastrostomy tube placement-02/21/2020 MEDICATIONS: None. CONTRAST:  225mOMNIPAQUE IOHEXOL 300 MG/ML  SOLN administered into the gastric lumen and proximal small bowel FLUOROSCOPY TIME:  22 minutes, 24 seconds (754 mGy) COMPLICATIONS: None. PROCEDURE: Informed written consent was obtained from the patient after a discussion of the risks and benefits. The upper abdomen and the external portion of the existing gastrostomy tube was prepped and draped in the usual sterile fashion, and a sterile drape was applied covering the operative field. Maximum barrier sterile technique with sterile gowns and gloves were used for the procedure. A timeout was performed prior to the initiation of the procedure. The gastrostomy tube was injected with a small amount of contrast demonstrating significant distension the stomach which is noted to be filled with a large amount of debris. Ultimately with prolonged diligent effort, a stiff glidewire was advanced through the subtotal occlusion of the gastric antrum with the use of a short Kumpe catheter. Under imaged fluoroscopic guidance, the Kumpe catheter was exchanged for a coaxial 9-French gastrojejunostomy catheter which was advanced over the stiff Glidewire with end ultimately co terminating within the proximal jejunum. Contrast injection demonstrated appropriate positioning and functionality of both the gastric and jejunal lumens. Given the significant amount of debris within the stomach as well as patient's presenting complaint  of excessive pericatheter leakage, an 18 gauge nasogastric 2 was advanced through the left narrowings to the level of the stomach under intermittent fluoroscopic guidance after the left naris was anesthetized with viscous lidocaine. Postprocedural spot fluoroscopic images were obtained and the procedure was terminated. The nasogastric tube was affixed at the nose with retention tape. Dressings were applied. The patient tolerated procedure well without immediate postprocedural complication. IMPRESSION: 1. Successful fluoroscopic guided conversion of pull-through gastrostomy tube to a gastrojejunostomy catheter with jejunal limb terminating within the proximal jejunum. 2. Successful fluoroscopic guided placement of an 21 French nasogastric tube for the purposes of maximum gastric venting. PLAN: - Given the large amount of retained debris within the stomach, recommended the nasogastric tube stay in place for 1-2 days connected to low wall suction to allow for optimal gastric venting. - The jejunal lumen is ready for immediate use for feeding and the gastric lumen may be utilized for venting purposes as indicated. 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 Electronically Signed   By: Sandi Mariscal M.D.   On: 04/24/2020 16:35   DG ABD ACUTE 2+V W 1V CHEST  Result Date: 04/24/2020 CLINICAL DATA:  Abdominal pain EXAM: DG ABDOMEN ACUTE WITH 1 VIEW CHEST COMPARISON:  Chest CT February 17, 2020; abdominal radiograph February 15, 2020 FINDINGS: AP chest: Port-A-Cath tip is at the cavoatrial junction. No pneumothorax. There is mild atelectasis in the right base. Lungs otherwise are clear. Heart size and pulmonary vascularity are normal. No adenopathy. There is aortic atherosclerosis. Supine and left lateral decubitus abdomen: Gastrostomy catheter in region of stomach. No bowel dilatation or air-fluid level  to suggest bowel obstruction. No free air. There are surgical clips in right upper quadrant. IMPRESSION: Gastrostomy catheter in region of stomach. No bowel dilatation or air-fluid level to suggest bowel obstruction. No free air. Surgical clips right upper quadrant. Slight right base atelectasis. No edema or airspace opacity. Port-A-Cath tip in superior vena cava. Aortic Atherosclerosis (ICD10-I70.0). Electronically Signed   By: Lowella Grip III M.D.   On: 04/24/2020 10:15    EKG: Not done  Assessment/Plan Present on Admission: . 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 Chronic  obstructive pulmonary disease ([place]) . HYPERTENSION, BENIGN SYSTEMIC . Obstructive sleep apnea . Gastrostomy tube dysfunction ([facility])  Principal Problem:   Gastrostomy tube dysfunction ([place]) in the setting of metastatic cholangiocarcinoma: Status post conversion into GJ tube.  Discussed with interventional radiology and J-tube malfunction for tube feeds.  So much debris in stomach, patient also had an NG tube placed that will be on continuous suction for several days removing all of this.  Patient should not be eating solids and be on liquids only.  Discussed with hospice and apparently, patient was told based by the hospitalist at her last admission that she should not be eating solids and only liquids and she clearly insists on eating.  Have consulted nutrition for setting up tube feeds.  Hospice will discussed with patient, she may not qualify for hospice services if she is on tube feeds and she may continue to insist on eating solid food. Active Problems:   Obstructive sleep apnea   HYPERTENSION, BENIGN SYSTEMIC: Stable, continue home medications   Chronic obstructive pulmonary disease ([place]): Oxygenation stable    Type 2 diabetes mellitus without complication, without long-term current use of insulin ([place]): Sliding scale q4 hours   DVT prophylaxis: SCDs  Code Status: Partial code  Family Communication:  Left message for daughter  Disposition Plan: Home in a few days once NG tube can be discontinued.  We will also further determine hospice qualifications  Consults called: Hospice Palliative care Interventional radiology  Admission status: Given need for acute hospital services past 2 midnights, admit as inpatient  COVID status: Patient is COVID-negative.  Vaccination status unclear  Annita Brod MD [facility] Pager [phone]  If 7PM-7AM, please contact night-coverage [email or web address] Password [facility]  04/24/2020, 5:41 PM

## 2020-04-24 NOTE — Procedures (Signed)
Pre procedural Dx: Dysphagia, poorly functioning feeding tube. Post procedural Dx: Same  - Successful fluoroscopic guided conversion of pull-through gastrostomy tube to a gastrojejunostomy catheter with jejunal limb terminating within the proximal jejunum.  - Successful fluoroscopic guided placement of an 26 French nasogastric tube for the purposes of maximum gastric venting.    EBL: None Complications: None immediate.  PLAN:  - Given the large amount of retained debris within the stomach, recommended the nasogastric tube stay in place for 1-2 days connected to low wall suction to allow for optimal gastric venting.  - The jejunal lumen is ready for immediate use for feeding and the gastric lumen may be utilized for venting purposes as indicated.  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  Ronny Bacon, MD Pager #: [phone]

## 2020-04-24 NOTE — Telephone Encounter (Signed)
Received VM left by [facility] left @ 4:45 Friday stating patient's "PEG tube is leaking, belly  is tight, appetite is off and on, and patient is experiencing a lot of pain".   RN opened chart in [place] and noted that patient is in the ED.   Also received a print out from [facility] After hours Access Nurse line from Sunday, 04/23/20 @ 0909 stating same as above. They directed patient to go to the ED.

## 2020-04-24 NOTE — Progress Notes (Addendum)
HEMATOLOGY-ONCOLOGY PROGRESS NOTE  SUBJECTIVE: Bonnie Peterson is known to our practice.  She has metastatic cholangiocarcinoma.  She is currently under hospice care.  She came to the emergency room due to increased pain around her venting G-tube site with significant drainage from the site.  Patient reports that she has had drainage from her G-tube site for almost a week.  Reports drainage can be clear and bloody at times.  It is very foul-smelling.  She states that whenever she eats or drinks anything, it comes right back out from around the G-tube.  Reports that her pain is a 10 out of 10.  The patient reports that she is not able to sit up or ambulate due to the pain.  She has had anorexia and weight loss recently.  She has not had any recent nausea or vomiting.  Daughter is at the bedside.  Oncology History  Cholangiocarcinoma metastatic to liver [facility])  2014 Initial Diagnosis   Intrahepatic cholangiocarcinoma ([place])   2014 Initial Diagnosis   Per notes from [facility] by Dr. Mettu 1. 2014 Mass in the left side of the liver incidentally found during work up for acute pancreatitis.  The mass was thought to be a bile duct lesion and was followed up.  2. 02/2013 MRI hyperdense focus within the left hepatic lobe.  3. 02/21/13 CT scan 3.7 x 2.1 x 2.4 cm arterial enhancing lesion.  AFP and CEA were normal.  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 4. 05/18/12 Biopsy of something (surg path in Care Everywhere, but no report) 5. 07/02/13 Biopsy of something (surg path in Care Everywhere, but no report) 6. 09/02/13 MRI lesion in segment 4A with adjacent perfusional enhancement with some washout. Lesion is close to left portal vein. 7. 09/15/14 MRI segment 4A lesion slightly increased in size, minimal fat containing, peripheral enhancement and arterial enhancement. Left portal vein is clearly not involved.   02/19/2015 Surgery   Per notes from [facility] by Dr. Dennison Nancy L lobe of liver partial  hepatectomy segments 2, 3, 4a, 4b invasive cholangiocarcinoma, moderately differentiated. Tumor focally extends to the parenchymal margin of resection. Uninvolved liver parenchyma with steatohepatitis and associated periportal and centrilobular pericellular fibrosis. Scattered von Meyenburg complexes. One hepatic artery LN with metastatic cholangiocarcinoma. Fibroadipose tissue and nerve negative for malignancy. Gallbladder negative for malignancy. One LN negative for malignancy. T2aN1 invasive cholangiocarcinoma, moderately differentiated, with positive hepatic parenchymal margin, and 1/2 nodes positive for tumor involvement. -Post operative course was complicated by fevers felt to be related to atelectasis   04/17/2015 Concurrent Chemotherapy   Per notes from [facility] by Dr. Dennison Nancy -04/17/15-05/26/15 Chemoradiation with capecitabine (locally) 50.4 Gy in 28 fractions.  -Patient started getting sick, hospitalized, fluids twice, was on ondansetron, and propranolol, and sucralfate  -EGD found ulcers in stomach  -06/14/15 CTA (per Dr. Shayne Alken note) no evidence of disease recurrence.  Formation of the stomach and duodenum was seen.   08/2015 - 2017 Chemotherapy   08/2015 4 cycles of gemcitabine.  Patient intolerant to this treatment with weight loss, anorexia, and fatigue.    10/16/2015 Surgery   Per notes from [facility] by Dr. Dennison Nancy -CTA chest abdomen and pelvis status post left hepatectomy with no evidence of recurrent disease or metastatic disease.  Right lower lobe pulmonary nodules unchanged.  CA 19-9 7   08/11/2017 Pathology Results   Per notes from [facility] by Dr. Dennison Nancy -07/29/17 CT abd -U/S biopsy of liver    08/27/2017 Imaging  Per note from [facility] by Dr. Dennison Nancy --08/25/17 CT chest, abdomen, and pelvis ill-defined hypoenhancing lesion in the inferior right liver concerning for m metastatic disease. Portocaval node measuring up to 1.2 cm in short axis   08/28/2017 Pathology Results   Per note from [facility] by Dr.  Dennison Nancy -Invasive moderate [place] differentiated adenocarcinoma morphologically compatible with cholangio- -09/02/17 [facility] consensus appears consistent with intrahepatic cholangiocarcinoma and surgical resection not appropriate.  If after systemic therapy she remains stable, no extrahepatic disease, could consider HAI pump in future.  Ablation not an option.   09/24/2017 - 10/15/2017 Radiation Therapy   Per note from [facility] by Dr. Dennison Nancy -XRT x3 weeks   12/30/2017 Imaging   Per note from [facility] by Dr. Dennison Nancy CT chest, abdomen, and pelvis slight interval increase in size of metastatic lesion in the liver with increasing necrosis.  Stable right lower lobe pulmonary nodule and portacaval lymph nodes   10/14/2018 Imaging   Per note from [facility] by Dr. Dennison Nancy CT chest, abdomen, and pelvis decrease size of liver lesions, PVT new, 1 to centimeter lymph node   05/14/2019 Imaging   Per note from [facility] by Dr. Dennison Nancy -CT infiltrative mass within the porta hepatis encasing the portal triads measuring up to 4.1 cm, concerning for residual/recurrent cholangiocarcinoma.  There is worsening thrombosis/tumor in the vein involving the entire portal venous system now extending into the right portal vein and distal aspect of the splenic vein.  There are at least 6 new liver lesions concerning for multifocal hepatic metastases.  Stable retroperitoneal lymphadenopathy.  Similar appearing distal gastric wall thickening, likely post radiation changes.  Stable subcentimeter pulmonary nodules, the largest within the right lower lobe measuring 0.8 cm.  Recommend continued attention to follow-up.   05/14/2019 Cancer Staging   Staging form: Intrahepatic Bile Duct, AJCC 8th Edition - Clinical stage from 05/14/2019: Stage IV (cTX, cNX, cM1) - Signed by Truitt Merle, MD on 06/20/2019   06/21/2019 - 08/02/2019 Chemotherapy   First-line 5-fluorouracil and leucovorin IV every 2 weeks starting 06/21/19. Chemo break since 08/02/19 due to poor tolerance. She declined  restarting    08/12/2019 Procedure   Colonosocpy by Dr [name]  - There were several lipomas spread throughout the colon. The largest was in the sigmoid (pedunculated 2-3cm, slightly inflamed at the tip). This was unchanged from 2015, the previous submucosal tattoo was still evident. - Internal hemorrhoids (this is likely the cause of your minor rectal bleeding). - Diverticulosis in the left colon. - The examination was otherwise normal on direct and retroflexion views. - No polyps or cancers.   08/17/2019 Imaging   CT AP W contrast  IMPRESSION: 1. Multifocal hepatic malignancy with multiple new enhancing lesions in the RIGHT hepatic lobe compared to CT 07/29/2017 (interim CT not available). 2. Post LEFT hepatectomy. 3. Poorly defined tissue planes in the porta hepatis concerning for tumor infiltration of the porta hepatis. 4. 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 5. Probable periportal adenopathy but difficult to define tissue planes 6. No disease outside the liver/porta hepatis identified.        REVIEW OF SYSTEMS:   Constitutional: Denies fevers, chills  Eyes: Denies blurriness of vision Ears, nose, mouth, throat, and face: Denies mucositis or sore throat Respiratory: Denies cough, dyspnea or wheezes Cardiovascular: Denies palpitation, chest discomfort Gastrointestinal: She reports that foods and liquids sometimes get stuck in her throat when she tries to  swallow, denies nausea and vomiting Skin: Denies abnormal skin rashes Lymphatics: Denies new lymphadenopathy or easy bruising Neurological:Denies numbness, tingling or new weaknesses Behavioral/Psych: Mood is stable, no new changes  Extremities: No lower extremity edema All other systems were reviewed with the patient and are negative.  I have reviewed the past medical history, past surgical history, social  history and family history with the patient and they are unchanged from previous note.   PHYSICAL EXAMINATION: ECOG PERFORMANCE STATUS: 3 - Symptomatic, >50% confined to bed  Vitals:   04/24/20 1300 04/24/20 1330  BP: (!) 167/46 (!) 175/48  Pulse: (!) 42 (!) 40  Resp: 17 19  Temp:    SpO2: 96% 96%   There were no vitals filed for this visit.  Intake/Output from previous day: No intake/output data recorded.  GENERAL: Chronically ill-appearing female, cachectic SKIN: skin color, texture, turgor are normal, no rashes or significant lesions EYES: normal, Conjunctiva are pink and non-injected, sclera clear OROPHARYNX:no exudate, no erythema and lips, buccal mucosa, and tongue normal  LUNGS: clear to auscultation and percussion with normal breathing effort HEART: regular rate & rhythm and no murmurs and no lower extremity edema ABDOMEN: Mildly distended, generalized tenderness, no erythema surrounding G-tube NEURO: alert & oriented x 3 with fluent speech, no focal motor/sensory deficits  LABORATORY DATA:  I have reviewed the data as listed CMP Latest Ref Rng & Units 04/24/2020 02/25/2020 02/23/2020  Glucose 70 - 99 mg/dL 128(H) 121(H) 96  BUN 8 - 23 mg/dL 22 10 10   Creatinine 0.44 - 1.00 mg/dL 0.67 0.65 0.56  Sodium 135 - 145 mmol/L 138 138 138  Potassium 3.5 - 5.1 mmol/L 3.5 4.6 3.9  Chloride 98 - 111 mmol/L 97(L) 101 107  CO2 22 - 32 mmol/L 24 23 26   Calcium 8.9 - 10.3 mg/dL 8.9 9.0 8.6(L)  Total Protein 6.5 - 8.1 g/dL 5.9(L) - 5.3(L)  Total Bilirubin 0.3 - 1.2 mg/dL 1.0 - 0.6  Alkaline Phos 38 - 126 U/L 129(H) - 75  AST 15 - 41 U/L 53(H) - 32  ALT 0 - 44 U/L 58(H) - 16    Lab Results  Component Value Date   WBC 5.2 04/24/2020   HGB 12.7 04/24/2020   HCT 38.6 04/24/2020   MCV 90.8 04/24/2020   PLT 117 (L) 04/24/2020   NEUTROABS 3.7 04/24/2020    DG ABD ACUTE 2+V W 1V CHEST  Result Date: 04/24/2020 CLINICAL DATA:  Abdominal pain EXAM: DG ABDOMEN ACUTE WITH 1 VIEW  CHEST COMPARISON:  Chest CT February 17, 2020; abdominal radiograph February 15, 2020 FINDINGS: AP chest: Port-A-Cath tip is at the cavoatrial junction. No pneumothorax. There is mild atelectasis in the right base. Lungs otherwise are clear. Heart size and pulmonary vascularity are normal. No adenopathy. There is aortic atherosclerosis. Supine and left lateral decubitus abdomen: Gastrostomy catheter in region of stomach. No bowel dilatation or air-fluid level to suggest bowel obstruction. No free air. There are surgical clips in right upper quadrant. IMPRESSION: Gastrostomy catheter in region of stomach. No bowel dilatation or air-fluid level to suggest bowel obstruction. No free air. Surgical clips right upper quadrant. Slight right base atelectasis. No edema or airspace opacity. Port-A-Cath tip in superior vena cava. Aortic Atherosclerosis (ICD10-I70.0). Electronically Signed   By: Lowella Grip III M.D.   On: 04/24/2020 10:15    ASSESSMENT AND PLAN: 1.  Metastatic cholangiocarcinoma 2.  Drainage surrounding G-tube site-?  Due to ascites 3.  Abdominal pain due to malignancy 4.  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  Protein calorie malnutrition 6.  Hypertension  -The patient's palliative G-tube is currently draining she is having significant pain.  Suspect drainage is secondary to ascites and pain is due to her metastatic malignancy.  Interventional radiology consult has been placed for evaluation of G-tube placement.  Recommend admission to observation status for further evaluation of her G-tube. -The patient has expressed that she would like the G-tube to be removed.  We discussed that the G-tube can be removed, however, due to her gastric outlet obstruction she will likely have increased nausea and vomiting. -Recommend pain medication for her abdominal pain.   LOS: 0 days    Mikey Bussing, DNP, AGPCNP-BC, AOCNP 04/24/20  Addendum  I have seen the patient, examined her. I agree with the assessment and and plan and have edited the notes.   Pt and her daughter her venting G-tube dysfuction started about a week ago. I confirmed with IR that they will reevaluate the position and function tube today. Pt was benefiting from the G-tube before it stopped working, not having recurrent nausea and vomiting she had before, so I encouraged her to keep it or get it exchanged by IR if needed. She is agreeable to continue hospice care after the tube issues resolves.   Truitt Merle  04/24/2020

## 2020-04-24 NOTE — ED Provider Notes (Signed)
[facility] DEPT Provider Note   CSN: [id number] Arrival date & time: 04/24/20  0254     History Chief Complaint  Patient presents with  . G Tube Issue    Bonnie Peterson is a 75 y.o. female.  75 year old female with history of metastatic intrahepatic cholangiocarcinoma who is currently on hospice but is a partial code who presents with worsening abdominal discomfort.  Patient has a G-tube in place and has noted change in the color of the secretions.  States that she has diffuse weakness as well as pain in the catheter site.  Denies any fever or chills.  States when she tries to swallow it takes a while for it to go down.  Her pain has been refractory to her home medications.  She denies any black or bloody stools.  Denies any hematemesis.        Past Medical History:  Diagnosis Date  . Anxiety   . Benign positional vertigo   . Cancer ([place]) 02/09/15   intrahepatic cholangiocarcinoma  . COPD (chronic obstructive pulmonary disease) ([place])   . Depression   . Diabetes mellitus without complication ([place])   . Early cataracts, bilateral 10/13   Optho, Dr Gershon Crane  . Echocardiogram findings abnormal, without diagnosis 10/10   10/10: mild pulm HTN, EF 60-65%, mild LVH, moderate aortic regurg  . GERD (gastroesophageal reflux disease)    I have "acid reflux"  . Gout   . HLD (hyperlipidemia)   . HTN, goal below 130/80   . Irritable bowel syndrome   . Obesity, Class III, BMI 40-49.9 (morbid obesity) ([place])   . Obstructive sleep apnea    wears cpap  . Osteoarthritis (arthritis due to wear and tear of joints)    also gout  . Restrictive lung disease     Patient Active Problem List   Diagnosis Date Noted  . Pedal edema 02/27/2020  . Palliative care by specialist   . General weakness   . Hospice care patient   . Non-intractable vomiting   . Acute esophagitis   . Cholangiocarcinoma ([place]) 02/15/2020  . Intractable nausea and vomiting 01/14/2020  .  Rectal bleeding   . Depression 07/19/2019  . Goals of care, counseling/discussion 06/11/2019  . Blood in stool 06/11/2019  . Acute pain of left lower extremity 01/05/2019  . Gastroesophageal reflux disease without esophagitis 06/03/2018  . Lung nodules 05/04/2018  . Type 2 diabetes mellitus without complication, without long-term current use of insulin ([place]) 01/05/2017  . Healthcare maintenance 01/24/2016  . Back pain 11/23/2015  . Port catheter in place 11/10/2015  . 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 Cholangiocarcinoma metastatic to liver ([place]) 03/27/2015  . History of resection of liver 02/10/2015  . 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 Inadequate sleep hygiene 08/22/2014  . 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 IBS (irritable bowel syndrome) 03/22/2013  . Liver mass, left lobe 02/24/2013  . Osteoporosis 10/15/2012  . Vitamin D deficiency 12/23/2011  . Cigarette smoker 10/12/2010  . Chronic obstructive pulmonary disease ([place]) 06/14/2010  . Obstructive sleep apnea 04/27/2009  . OBESITY, UNSPECIFIED 04/26/2009  . Hyperlipidemia 11/25/2007  . GOUT NOS 12/24/2006  . Recurrent major depressive disorder ([place]) 06/05/2006  . Anxiety 06/05/2006  . Tobacco use 06/05/2006  . HYPERTENSION, BENIGN SYSTEMIC 06/05/2006  . Osteoarthritis 06/05/2006  . Generalized anxiety disorder 06/05/2006    Past Surgical History:  Procedure Laterality Date  . ABDOMINAL HYSTERECTOMY    . BIOPSY  02/18/2020  Procedure: BIOPSY;  Surgeon: Thornton Park, MD;  Location: [facility];  Service: Gastroenterology;;  . COLONOSCOPY WITH PROPOFOL N/A 08/12/2019   Procedure: COLONOSCOPY WITH PROPOFOL;  Surgeon: [name], MD;  Location: [facility];  Service: Endoscopy;  Laterality: N/A;  . ESOPHAGOGASTRODUODENOSCOPY N/A 06/15/2015   Procedure: ESOPHAGOGASTRODUODENOSCOPY (EGD);  Surgeon: Gatha Mayer, MD;  Location: [facility];  Service: Endoscopy;  Laterality: N/A;   . ESOPHAGOGASTRODUODENOSCOPY (EGD) WITH PROPOFOL N/A 02/18/2020   Procedure: ESOPHAGOGASTRODUODENOSCOPY (EGD) WITH PROPOFOL;  Surgeon: Thornton Park, MD;  Location: [facility];  Service: Gastroenterology;  Laterality: N/A;  . IR GASTR TUBE CONVERT GASTR-JEJ PER W/FL MOD SED  02/21/2020  . IR GASTROSTOMY TUBE MOD SED  02/21/2020  . IR IMAGING GUIDED PORT INSERTION  06/15/2019  . IR REMOVAL TUN ACCESS W/ PORT W/O FL MOD SED  11/11/2016  . LIVER BIOPSY    . OPEN PARTIAL HEPATECTOMY  Left 02/09/15  . PARTIAL HYSTERECTOMY    . ROTATOR CUFF REPAIR       L rotator cuff repair 11/07-[place] - 12/7/200  . TONSILLECTOMY       OB History   No obstetric history on file.     Family History  Problem Relation Age of Onset  . Breast cancer Mother   . Hypertension Mother   . Coronary artery disease Mother   . Diabetes type II Mother   . Rheum arthritis Mother   . Diabetes type II Sister   . Pancreatic cancer Sister     Social History   Tobacco Use  . Smoking status: Former Smoker    Packs/day: 0.50    Years: 49.00    Pack years: 24.50    Types: Cigarettes    Quit date: 04/13/2018    Years since quitting: 2.0  . Smokeless tobacco: Never Used  Vaping Use  . Vaping Use: Never used  Substance Use Topics  . Alcohol use: Yes    Alcohol/week: 0.0 standard drinks    Comment: occasionally  . Drug use: Yes    Types: Marijuana    Home Medications Prior to Admission medications   Medication Sig Start Date End Date Taking? Authorizing Provider  albuterol (PROAIR HFA) 108 (90 Base) MCG/ACT inhaler Inhale 1-2 puffs into the lungs every 6 (six) hours as needed for wheezing or shortness of breath. 07/23/19   Parrett, Fonnie Mu, NP  alendronate (FOSAMAX) 70 MG tablet TAKE 1 TABLET  ONCE A WEEK. Patient taking differently: Take 70 mg by mouth every Sunday. TAKE 1 TABLET  ONCE A WEEK. 03/02/19   Lattie Haw, MD  cetirizine (ZYRTEC) 10 MG tablet Take 10 mg by mouth daily as needed for allergies.     [provider]  cholecalciferol (VITAMIN D) 1000 units tablet Take 1 tablet (1,000 Units total) by mouth daily. 01/03/17   Bufford Lope, DO  cloNIDine (CATAPRES - DOSED IN MG/24 HR) 0.3 mg/24hr patch Place 1 patch (0.3 mg total) onto the skin once a week. 02/26/20   Caren Griffins, MD  dexamethasone (DECADRON) 4 MG tablet Take 1 tablet (4 mg total) by mouth 2 (two) times daily with a meal. 04/13/20   Truitt Merle, MD  dicyclomine (BENTYL) 20 MG tablet Take 1 tablet (20 mg total) by mouth 2 (two) times daily as needed for spasms (abdominal pain). 01/11/20   Gareth Morgan, MD  famotidine (PEPCID) 20 MG tablet Take 1 tablet (20 mg total) by mouth 2 (two) times daily. Patient taking differently: Take 20 mg by  mouth 2 (two) times daily as needed for heartburn.  06/26/19   Lattie Haw, MD  fish oil-omega-3 fatty acids 1000 MG capsule Take 1 g by mouth once a week.     [provider]  fluticasone (FLONASE) 50 MCG/ACT nasal spray Place 2 sprays into both nostrils daily as needed for rhinitis. 04/17/16   Haney, Amedeo Plenty, MD  haloperidol (HALDOL) 0.5 MG tablet Take 1 tablet (0.5 mg total) by mouth every 6 (six) hours as needed for agitation (or nausea). 01/15/20   Micheline Rough, MD  HYDROmorphone (DILAUDID) 2 MG tablet Take 1 tablet (2 mg total) by mouth every 4 (four) hours as needed for severe pain. 02/22/20   Caren Griffins, MD  Hyprom-Naphaz-Polysorb-Zn Sulf (CLEAR EYES COMPLETE OP) Place 1 drop into both eyes daily as needed (dry eyes).     [provider]  lidocaine-prilocaine (EMLA) cream Apply 1 application topically every 14 (fourteen) days. Every 14 days as needed Patient taking differently: Apply 1 application topically as needed (port access).  06/17/19   Truitt Merle, MD  LORazepam (ATIVAN) 1 MG tablet Take 1 mg by mouth daily as needed for anxiety.  01/26/20   [provider]  metoCLOPramide (REGLAN) 10 MG tablet Take 10 mg by mouth 4 (four) times daily. 01/25/20    [provider]  mirtazapine (REMERON SOL-TAB) 15 MG disintegrating tablet Take 1 tablet (15 mg total) by mouth at bedtime. 01/06/20 01/05/21  Truitt Merle, MD  Multiple Vitamin (MULTIVITAMIN WITH MINERALS) TABS tablet Take 1 tablet by mouth daily.    [provider]  Na Sulfate-K Sulfate-Mg Sulf (SUPREP BOWEL PREP KIT) 17.5-3.13-1.6 GM/177ML SOLN Take 1 kit by mouth as directed. Patient not taking: Reported on 01/15/2020 07/21/19   [name], MD  ondansetron (ZOFRAN ODT) 8 MG disintegrating tablet Take 1 tablet (8 mg total) by mouth every 8 (eight) hours as needed for nausea or vomiting. 01/12/20   Tanner, Lyndon Code., PA-C  pantoprazole (PROTONIX) 40 MG tablet Take 1 tablet (40 mg total) by mouth 2 (two) times daily before a meal. 02/22/20 02/21/21  Caren Griffins, MD  Probiotic Product (PROBIOTIC PO) Take 1 capsule by mouth daily.    [provider]  promethazine (PHENERGAN) 25 MG tablet Take 25 mg by mouth every 6 (six) hours as needed for nausea or vomiting.  02/03/20   [provider]  scopolamine (TRANSDERM-SCOP) 1 MG/3DAYS Place 1 patch onto the skin every 3 (three) days.  02/06/20   [provider]  traMADol (ULTRAM) 50 MG tablet Take 1 tablet (50 mg total) by mouth every 6 (six) hours as needed. Patient not taking: Reported on 01/15/2020 12/20/19   Truitt Merle, MD  VITAMIN E PO Take 1 tablet by mouth See admin instructions. 2-3 times a month    [provider]    Allergies    Other and Propofol  Review of Systems   Review of Systems  All other systems reviewed and are negative.   Physical Exam Updated Vital Signs There were no vitals taken for this visit.  Physical Exam Vitals and nursing note reviewed.  Constitutional:      General: She is not in acute distress.    Appearance: She is well-nourished. She is cachectic. She is not toxic-appearing.  HENT:     Head: Normocephalic and atraumatic.  Eyes:     General: Lids are  normal.     Extraocular Movements: EOM normal.     Conjunctiva/sclera: Conjunctivae  normal.     Pupils: Pupils are equal, round, and reactive to light.  Neck:     Thyroid: No thyroid mass.     Trachea: No tracheal deviation.  Cardiovascular:     Rate and Rhythm: Normal rate and regular rhythm.     Heart sounds: Normal heart sounds. No murmur heard. No gallop.   Pulmonary:     Effort: Pulmonary effort is normal. No respiratory distress.     Breath sounds: Normal breath sounds. No stridor. No decreased breath sounds, wheezing, rhonchi or rales.  Abdominal:     General: Bowel sounds are normal. There is no distension.     Palpations: Abdomen is soft.     Tenderness: There is generalized abdominal tenderness. There is no CVA tenderness, guarding or rebound.    Musculoskeletal:        General: No tenderness or edema. Normal range of motion.     Cervical back: Normal range of motion and neck supple.  Skin:    General: Skin is warm and dry.     Findings: No abrasion or rash.  Neurological:     Mental Status: She is alert and oriented to person, place, and time.     GCS: GCS eye subscore is 4. GCS verbal subscore is 5. GCS motor subscore is 6.     Cranial Nerves: No cranial nerve deficit.     Sensory: No sensory deficit.     Deep Tendon Reflexes: Strength normal.  Psychiatric:        Mood and Affect: Mood and affect normal.        Speech: Speech normal.        Behavior: Behavior normal.     ED Results / Procedures / Treatments   Labs (all labs ordered are listed, but only abnormal results are displayed) Labs Reviewed  PROTIME-INR  APTT  CBC WITH DIFFERENTIAL/PLATELET  COMPREHENSIVE METABOLIC PANEL  LIPASE, BLOOD    EKG None  Radiology No results found.  Procedures Procedures (including critical care time)  Medications Ordered in ED Medications  sodium chloride 0.9 % bolus 1,000 mL (has no administration in time range)  0.9 %  sodium chloride infusion (has no  administration in time range)  HYDROmorphone (DILAUDID) injection 0.5 mg (has no administration in time range)    ED Course  I have reviewed the triage vital signs and the nursing notes.  Pertinent labs & imaging results that were available during my care of the patient were reviewed by me and considered in my medical decision making (see chart for details).    MDM Rules/Calculators/A&P                          Patient medicated for pain here and does feel somewhat better.  Acute abdominal series shows no acute abnormalities.  Labs are reassuring.  Discussed with oncology about patient's current situation with her G-tube.  They recommend overnight observation to address pain management as well as G-tube status. Final Clinical Impression(s) / ED Diagnoses Final diagnoses:  None    Rx / DC Orders ED Discharge Orders    None       Lacretia Leigh, MD 04/24/20 1340

## 2020-04-24 NOTE — Progress Notes (Signed)
Manufacturing engineer [facility])      This patient is enrolled with [facility] for hospice services.  Per [facility] RN notes pt has been c/o increased pain and drainage at G tube site.  Messages left for Dr. Burr Medico x 2.  Pt unable to eat or drink.   [facility] will continue to follow for any discharge planning needs and to coordinate continuation of hospice care.    Thank you for the opportunity to participate in this patient's care.     Domenic Moras, BSN, RN [facility] Liaison [phone]   [phone] (24h on call)

## 2020-04-25 ENCOUNTER — Other Ambulatory Visit: Payer: Self-pay

## 2020-04-25 ENCOUNTER — Inpatient Hospital Stay (HOSPITAL_COMMUNITY)

## 2020-04-25 LAB — BASIC METABOLIC PANEL
Anion gap: 12 (ref 5–15)
BUN: 23 mg/dL (ref 8–23)
CO2: 26 mmol/L (ref 22–32)
Calcium: 8.4 mg/dL — ABNORMAL LOW (ref 8.9–10.3)
Chloride: 100 mmol/L (ref 98–111)
Creatinine, Ser: 0.57 mg/dL (ref 0.44–1.00)
GFR, Estimated: >60 mL/min (ref >60)
Glucose, Bld: 128 mg/dL — ABNORMAL HIGH (ref 70–99)
Potassium: 3.3 mmol/L — ABNORMAL LOW (ref 3.5–5.1)
Sodium: 138 mmol/L (ref 135–145)

## 2020-04-25 LAB — CBC
HCT: 38.5 % (ref 36.0–46.0)
Hemoglobin: 12.6 g/dL (ref 12.0–15.0)
MCH: 29.8 pg (ref 26.0–34.0)
MCHC: 32.7 g/dL (ref 30.0–36.0)
MCV: 91 fL (ref 80.0–100.0)
Platelets: 116 10*3/uL — ABNORMAL LOW (ref 150–400)
RBC: 4.23 MIL/uL (ref 3.87–5.11)
RDW: 15.7 % — ABNORMAL HIGH (ref 11.5–15.5)
WBC: 5.4 10*3/uL (ref 4.0–10.5)
nRBC: 0 % (ref 0.0–0.2)

## 2020-04-25 LAB — GLUCOSE, CAPILLARY
Glucose-Capillary: 134 mg/dL — ABNORMAL HIGH (ref 70–99)
Glucose-Capillary: 139 mg/dL — ABNORMAL HIGH (ref 70–99)
Glucose-Capillary: 162 mg/dL — ABNORMAL HIGH (ref 70–99)

## 2020-04-25 LAB — CBG MONITORING, ED
Glucose-Capillary: 105 mg/dL — ABNORMAL HIGH (ref 70–99)
Glucose-Capillary: 119 mg/dL — ABNORMAL HIGH (ref 70–99)
Glucose-Capillary: 111 mg/dL — ABNORMAL HIGH (ref 70–99)

## 2020-04-25 MED ORDER — PANTOPRAZOLE SODIUM 40 MG IV SOLR
40.0000 mg | INTRAVENOUS | Status: DC
  Administered 2020-04-25 – 2020-04-28 (×4): 40 mg via INTRAVENOUS
  Filled 2020-04-25 (×4): qty 40

## 2020-04-25 MED ORDER — DEXTROSE-NACL 5-0.9 % IV SOLN: INTRAVENOUS | Status: DC

## 2020-04-25 MED ORDER — PHENOL 1.4 % MT LIQD
1.0000 | OROMUCOSAL | Status: DC | PRN
  Administered 2020-04-25: 1 via OROMUCOSAL
  Filled 2020-04-25: qty 177

## 2020-04-25 MED ORDER — METHYLPREDNISOLONE SODIUM SUCC 40 MG IJ SOLR
40.0000 mg | Freq: Two times a day (BID) | INTRAMUSCULAR | Status: DC
  Administered 2020-04-25 – 2020-04-27 (×5): 40 mg via INTRAVENOUS
  Filled 2020-04-25 (×5): qty 1

## 2020-04-25 MED ORDER — HYDRALAZINE HCL 20 MG/ML IJ SOLN
10.0000 mg | Freq: Four times a day (QID) | INTRAMUSCULAR | Status: DC | PRN
  Administered 2020-04-25 – 2020-04-27 (×3): 10 mg via INTRAVENOUS
  Filled 2020-04-25 (×4): qty 1

## 2020-04-25 MED ORDER — HYDROMORPHONE HCL 1 MG/ML IJ SOLN
0.5000 mg | INTRAMUSCULAR | Status: DC | PRN
  Administered 2020-04-25 – 2020-04-30 (×15): 0.5 mg via INTRAVENOUS
  Filled 2020-04-25 (×15): qty 0.5

## 2020-04-25 MED ORDER — IPRATROPIUM-ALBUTEROL 0.5-2.5 (3) MG/3ML IN SOLN
3.0000 mL | Freq: Four times a day (QID) | RESPIRATORY_TRACT | Status: DC
  Administered 2020-04-25: 3 mL via RESPIRATORY_TRACT
  Filled 2020-04-25: qty 3

## 2020-04-25 MED ORDER — CHLORHEXIDINE GLUCONATE CLOTH 2 % EX PADS
6.0000 | MEDICATED_PAD | Freq: Every day | CUTANEOUS | Status: DC
  Administered 2020-04-25 – 2020-04-30 (×6): 6 via TOPICAL

## 2020-04-25 MED ORDER — HYDROMORPHONE HCL 1 MG/ML IJ SOLN
1.0000 mg | Freq: Once | INTRAMUSCULAR | Status: AC
Start: 2020-04-25 — End: 2020-04-25
  Administered 2020-04-25: 1 mg via INTRAVENOUS
  Filled 2020-04-25: qty 1

## 2020-04-25 MED ORDER — POTASSIUM CHLORIDE 10 MEQ/100ML IV SOLN
10.0000 meq | INTRAVENOUS | Status: AC
  Administered 2020-04-25 (×4): 10 meq via INTRAVENOUS
  Filled 2020-04-25 (×3): qty 100

## 2020-04-25 MED ORDER — IPRATROPIUM-ALBUTEROL 0.5-2.5 (3) MG/3ML IN SOLN: 3.0000 mL | Freq: Three times a day (TID) | RESPIRATORY_TRACT | Status: DC

## 2020-04-25 NOTE — Progress Notes (Signed)
[facility]) Hospitalized Hospice Patient Note Tangie Stay is an [facility] hospice patient admitted 01/13/20 with a terminal diagnosis of intrahepatic cholangiocarcinoma.  Patient (pt) had been experiencing increased drainage from and around g-tube with pt complaining of increased pain at site.  Patient was transported to [facility] ED for evaluation and was admitted.  [facility] was made aware before transport. Per Dr. Cherie Ouch this is a related and covered admission.    Visited with pt and son bedside; daughter [name] phoned into visit.  Pt was alert, complaining of nausea and salivating.  Reviewed recent events with pt and family, who verbalized understanding that from now on nutrition will come from feeding supplement per J-G tube and not from eating solid foods.  No new needs at this time.    This patient is still appropriate for GIP status due to ongoing IV therapies and close monitoring for PRN medications.    Vital Signs:  97.7 oral, 137/54, 50 HR, 15 RR, 99%RA I&O:  400/400 Abnormal Labs: K 3.3, Ca 8.4, Platelets 116 Diagnostics: IMPRESSION: 1. Successful fluoroscopic guided conversion of pull-through gastrostomy tube to a gastrojejunostomy catheter with jejunal limb terminating within the proximal jejunum. 2. Successful fluoroscopic guided placement of an 74 French nasogastric tube for the purposes of maximum gastric venting.   PLAN: - Given the large amount of retained debris within the stomach, recommended the nasogastric tube stay in place for 1-2 days connected to low wall suction to allow for optimal gastric venting.   - The jejunal lumen is ready for immediate use for feeding and the gastric lumen may be utilized for venting purposes as indicated.   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       IV/PRN Meds:  protonix 40 mg PIV,  solumedrol 40 mg BID PIV,  0.9 Normal Saline (NS) bolus PIV,  KCl 56mq PIV x4,  5% dextrose + 0.9% NS @ 75 mL/hr continuous, dilaudid 0.5 mg PIV q4h PRN pain x 1 doses, Zofran 456mPIV q6h PRN nausea/vomiting x 2 doses, hydralazine 10 mg PIV q6h PRN HTN x 1 Problem List:  Principal Problem:    Gastrostomy tube dysfunction (HCC) Active Problems:     Obstructive sleep apnea     HYPERTENSION, BENIGN SYSTEMIC     Chronic obstructive pulmonary disease (HCC)     Cholangiocarcinoma metastatic to liver (HCC)  Type 2 diabetes mellitus without complication, without long-term current use of insulin ([address] Discharge Planning:  possible discharge home in two days, hospice services to continue  Family Contact: met with pt and son at bedside, spoke with daughter RoShirlean Mylar2 by phone IDT: updated Goals of Care: DNR, would like to return home when able Should patient need ambulance transport at discharge please use [facility] as they contract this service for our hospice patients.   Thank you for the opportunity to participate in this patient's care.   ChDomenic MorasBSN, RN [facility]ison   [phone]

## 2020-04-25 NOTE — ED Notes (Signed)
Dr. [place] Pink messaged regarding patients elevated BP. Awaiting orders for medication.

## 2020-04-25 NOTE — ED Notes (Signed)
Patients Blood pressure continues to stay elevated. Last BP was 191/52. Messaged E. Ouma, NP and awaiting orders.

## 2020-04-25 NOTE — ED Notes (Signed)
Patient's NG tube has had no drainage all shift. Patient had 2 episodes of vomiting brown thick substance that resembles stool like appearance. Stark Klein, NP made aware. Patients oxygen sats dropped to 88%. Patient put on 2L [place]. Oxygen saturation currently 93% on 2L [place].

## 2020-04-25 NOTE — ED Notes (Addendum)
Spoke to E. Ouma, NP on the phone, her suggestion is give medication for the elevated BP. Awaiting orders.

## 2020-04-25 NOTE — ED Notes (Signed)
MS 

## 2020-04-25 NOTE — ED Notes (Signed)
50 mL of brown, thick vomit in bag.

## 2020-04-25 NOTE — Progress Notes (Addendum)
PROGRESS NOTE    Bonnie Peterson  MHD:[id number] DOB: Apr 26, 1945 DOA: 04/24/2020 PCP: Lattie Haw, MD   Chief Complain: Abdominal pain  Brief Narrative: Patient is a 75 year old female with history of metastatic cholangiocarcinoma status post G-tube placement, COPD, OSA who is currently enrolled on hospice at home presents to the emergency department with complaints of worsening abdominal pain.  She has a G-tube in place and noted change in the color of secretions.  There was drainage from G-tube site for almost a week.  On her last admission, she was advised not to take any solid food but take only liquids on discharge.  She was continuing to take solid food by mouth.  Abdominal x-ray done in the emergency department did not show any evidence of bowel obstruction.  IR consulted for changing the tube.  She underwent successful conversion of G-tube to [place] tube.  Since she was noted to have large fluid and debris seen in the stomach, NG tube was placed.  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  There was drainage from G-tube site for almost a week.  On her last admission, she was advised not to take any solid food but take only liquids on discharge.  She was continuing to take solid food by mouth.  Abdominal x-ray done in the emergency department did not show any evidence of bowel obstruction.  IR consulted for changing the tube.  She underwent successful conversion of G-tube to [place] tube.  Since she was noted to have large fluid and debris seen in the stomach, NG  tube was placed.  We anticipate that the NG tube should be continued for next 12-24 hrs in order to clear the debris from the stomach.  When the drain from NG tube is less, will should probably start  on feedingthrough the G-tube . monitor for aspiration.  Currently her respiratory status is stable. We will continue to recommend not to have solid food by mouth.  Aspiration precautions.  Metastatic cholangiocarcinoma: Currently enrolled in hospice.  Was following with oncology in the past.  Hypertension: Currently blood pressure stable.  Continue current medications  COPD: She is not in respiratory distress but auscultation revealed bilateral wheezes.  Started on Solu-Medrol.  Diabetes type 2: Continue sliding scale insulin.  Monitor blood sugars.  Hypokalemia: Supplemented potassium  Thrombocytopenia: Most likely chronic and associated  with malignancy.  Continue to monitor  History of sleep apnea: Not on CPAP         DVT prophylaxis:SCD Code Status: partial Family Communication: called daughter on phone on 04/26/19 Status is: Inpatient  Remains inpatient appropriate because:Inpatient level of care appropriate due to severity of illness   Dispo: The patient is from: Home              Anticipated d/c is to: Home              Anticipated d/c date is: 1-2 days              Patient currently is not medically stable to d/c.     Consultants: iR  Procedures:As  abive  Antimicrobials:  Anti-infectives (From admission, onward)   None      Subjective: Patient seen and examined at bedside this morning.  Hemodynamically stable during my evaluation.  Denies any abdomen pain, nausea or vomiting.  She was still having dark fluid drainage from NG this mrng,bag was emptied .  During this afternoon, she again had issues with vomiting and nausea.  Objective: Vitals:   04/25/20 0530 04/25/20 0600 04/25/20 0630 04/25/20 0700  BP: (!) 124/46 116/62 (!) 140/47 (!) 152/53  Pulse: (!) 50  60 62 (!) 50  Resp: 16 18 12 15   Temp:      SpO2: 98% 100% 99% 99%    Intake/Output Summary (Last 24 hours) at 04/25/2020 0849 Last data filed at 04/24/2020 1356 Gross per 24 hour  Intake 1000 ml  Output --  Net 1000 ml   There were no vitals filed for this visit.  Examination:    General exam: Chronically ill looking, thin built.  Malnourished/cachectic HEENT: NG tube Respiratory system: Bilateral wheezes ,some rhonchi  cardiovascular system: S1 & S2 heard, RRR. No JVD, murmurs, rubs, gallops or clicks.  Port on the right chest Gastrointestinal system: Abdomen is nondistended, soft and nontender. No organomegaly or masses felt. Normal bowel sounds heard.  G-tube Central nervous system: Alert and oriented. No focal neurological deficits. Extremities: No edema, no clubbing ,no cyanosis Skin: No rashes, lesions or ulcers,no icterus ,no pallor     Data Reviewed: I have personally reviewed following labs and imaging studies  CBC: Recent Labs  Lab 04/24/20 0932 04/25/20 0250  WBC 5.2 5.4  NEUTROABS 3.7  --   HGB 12.7 12.6  HCT 38.6 38.5  MCV 90.8 91.0  PLT 117* 063*   Basic Metabolic Panel: Recent Labs  Lab 04/24/20 0932 04/25/20 0250  NA 138 138  K 3.5 3.3*  CL 97* 100  CO2 24 26  GLUCOSE 128* 128*  BUN 22 23  CREATININE 0.67 0.57  CALCIUM 8.9 8.4*   GFR: CrCl cannot be calculated (Unknown ideal weight.). Liver Function Tests: Recent Labs  Lab 04/24/20 0932  AST 53*  ALT 58*  ALKPHOS 129*  BILITOT 1.0  PROT 5.9*  ALBUMIN 2.8*   Recent Labs  Lab 04/24/20 0932  LIPASE 16   No results for input(s): AMMONIA in the last 168 hours. Coagulation Profile: Recent Labs  Lab 04/24/20 0932  INR 1.1   Cardiac Enzymes: No results for input(s): CKTOTAL, CKMB, CKMBINDEX, TROPONINI in the last 168 hours. BNP (last 3 results) No results for input(s): PROBNP in the last 8760 hours. HbA1C: No results for input(s): HGBA1C in the last 72  hours. CBG: Recent Labs  Lab 04/24/20 1935 04/24/20 2307 04/25/20 0145 04/25/20 0432 04/25/20 0820  GLUCAP 107* 98 105* 119* 111*   Lipid Profile: No results for input(s): CHOL, HDL, LDLCALC, TRIG, CHOLHDL, LDLDIRECT in the last 72 hours. Thyroid Function Tests: No results for input(s): TSH, T4TOTAL, FREET4, T3FREE, THYROIDAB in the last 72 hours. Anemia Panel: No results for input(s): VITAMINB12, FOLATE, FERRITIN, TIBC, IRON, RETICCTPCT in the last 72 hours. Sepsis Labs: No results for input(s): PROCALCITON, LATICACIDVEN in the last 168 hours.  Recent Results (from the past 240 hour(s))  SARS CORONAVIRUS 2 (TAT 6-24 HRS) Nasopharyngeal Nasopharyngeal Swab     Status: None   Collection Time: 04/24/20  1:41 PM   Specimen: Nasopharyngeal Swab  Result Value Ref Range Status   SARS Coronavirus 2 NEGATIVE NEGATIVE Final    Comment: (NOTE)  SARS-CoV-2 target nucleic acids are NOT DETECTED.  The SARS-CoV-2 RNA is generally detectable in upper and lower respiratory specimens during the acute phase of infection. Negative results do not preclude SARS-CoV-2 infection, do not rule out co-infections with other pathogens, and should not be used as the sole basis for treatment or other patient management decisions. Negative results must be combined with clinical observations, patient history, and epidemiological information. The expected result is Negative.  Fact Sheet for Patients: SugarRoll.be  Fact Sheet for Healthcare Providers: https://www.woods-mathews.com/  This test is not yet approved or cleared by the [place] FDA and  has been authorized for detection and/or diagnosis of SARS-CoV-2 by FDA under an Emergency Use Authorization (EUA). This EUA will remain  in effect (meaning this test can be used) for the duration of the COVID-19 declaration under Se ction 564(b)(1) of the Act, 21 U.S.C. section 360bbb-3(b)(1), unless the  authorization is terminated or revoked sooner.  Performed at [facility], [place] [address], [place], [place] [address]          Radiology Studies: DG Abd 1 View  Result Date: 04/25/2020 CLINICAL DATA:  Abdominal distention. EXAM: ABDOMEN - 1 VIEW COMPARISON:  IR images 04/24/2020.  Abdomen 04/24/2020 FINDINGS: Interim placement of NG tube, its tip is over the stomach. Gastrojejunostomy catheter in stable position. Surgical clips right upper quadrant. Oral contrast noted what appears to be colon. No bowel distention. Degenerative change lumbar spine. IMPRESSION: 1. Interim placement of NG tube, its tip is over the stomach. Gastrojejunostomy catheter in stable position. 2. Oral contrast noted in what appears to be colon. No bowel distention. Electronically Signed   By: [place]   On: 04/25/2020 06:30   IR GASTR TUBE CONVERT GASTR-JEJ PER W/FL MOD SED  Result Date: 04/24/2020 INDICATION: History of metastatic cholangiocarcinoma, currently on hospice. Patient underwent image guided placement of a pull-through venting gastrostomy tube on 02/21/2020 for palliative purposes. Attempt was made at the time of gastrostomy tube placement to convert the gastrostomy tube to a gastrojejunostomy catheter however this was unsuccessful secondary to subtotal occlusion of the gastric antrum. Patient returns today with complaint of excessive pericatheter leakage and as such presents for fluoroscopic guided gastrostomy tube evaluation and management. EXAM: 1. FLUOROSCOPIC GUIDED CONVERSION OF EXISTING GASTROSTOMY TUBE TO A GASTROJEJUNOSTOMY TUBE 2. FLUOROSCOPIC GUIDED PLACEMENT OF A NASOGASTRIC TUBE. COMPARISON:  Image guided pull-through gastrostomy tube placement-02/21/2020 MEDICATIONS: None. CONTRAST:  71m OMNIPAQUE IOHEXOL 300 MG/ML SOLN administered into the gastric lumen and proximal small bowel FLUOROSCOPY TIME:  22 minutes, 24 seconds (3433mGy) COMPLICATIONS: None. PROCEDURE: Informed written  consent was obtained from the patient after a discussion of the risks and benefits. The upper abdomen and the external portion of the existing gastrostomy tube was prepped and draped in the usual sterile fashion, and a sterile drape was applied covering the operative field. Maximum barrier sterile technique with sterile gowns and gloves were used for the procedure. A timeout was performed prior to the initiation of the procedure. The gastrostomy tube was injected with a small amount of contrast demonstrating significant distension the stomach which is noted to be filled with a large amount of debris. Ultimately with prolonged diligent effort, a stiff glidewire was advanced through the subtotal occlusion of the gastric antrum with the use of a short Kumpe catheter. Under imaged fluoroscopic guidance, the Kumpe catheter was exchanged for a coaxial 9-French gastrojejunostomy catheter which was advanced over the stiff Glidewire with end ultimately co terminating within the  proximal jejunum. Contrast injection demonstrated appropriate positioning and functionality of both the gastric and jejunal lumens. Given the significant amount of debris within the stomach as well as patient's presenting complaint of excessive pericatheter leakage, an 18 gauge nasogastric 2 was advanced through the left narrowings to the level of the stomach under intermittent fluoroscopic guidance after the left naris was anesthetized with viscous lidocaine. Postprocedural spot fluoroscopic images were obtained and the procedure was terminated. The nasogastric tube was affixed at the nose with retention tape. Dressings were applied. The patient tolerated procedure well without immediate postprocedural complication. IMPRESSION: 1. Successful fluoroscopic guided conversion of pull-through gastrostomy tube to a gastrojejunostomy catheter with jejunal limb terminating within the proximal jejunum. 2. Successful fluoroscopic guided placement of an 71  French nasogastric tube for the purposes of maximum gastric venting. PLAN: - Given the large amount of retained debris within the stomach, recommended the nasogastric tube stay in place for 1-2 days connected to low wall suction to allow for optimal gastric venting. - The jejunal lumen is ready for immediate use for feeding and the gastric lumen may be utilized for venting purposes as indicated. 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 Electronically Signed   By: Sandi Mariscal M.D.   On: 04/24/2020 16:35   IR INTRO LONG GI TUBE  Result Date: 04/24/2020 INDICATION: History of metastatic cholangiocarcinoma, currently on hospice. Patient underwent image guided placement of a pull-through venting gastrostomy tube on 02/21/2020 for palliative purposes. Attempt was made at the time of gastrostomy tube placement to convert the gastrostomy tube to a gastrojejunostomy catheter however this was unsuccessful secondary to subtotal occlusion of the gastric antrum. Patient returns today with complaint of excessive pericatheter leakage and as such presents for fluoroscopic guided gastrostomy tube evaluation and management. EXAM: 1. FLUOROSCOPIC GUIDED CONVERSION OF EXISTING GASTROSTOMY TUBE TO A GASTROJEJUNOSTOMY TUBE 2. FLUOROSCOPIC GUIDED PLACEMENT OF A NASOGASTRIC TUBE. COMPARISON:  Image guided pull-through gastrostomy tube placement-02/21/2020 MEDICATIONS: None. CONTRAST:  90m OMNIPAQUE IOHEXOL 300 MG/ML SOLN administered into the gastric lumen and proximal small bowel FLUOROSCOPY TIME:  22 minutes, 24 seconds (3295mGy) COMPLICATIONS: None. PROCEDURE: Informed written consent was obtained from the patient after a discussion of the risks and benefits. The upper abdomen and the external portion of the existing gastrostomy tube was prepped and draped in the usual sterile fashion, and a sterile drape was  applied covering the operative field. Maximum barrier sterile technique with sterile gowns and gloves were used for the procedure. A timeout was performed prior to the initiation of the procedure. The gastrostomy tube was injected with a small amount of contrast demonstrating significant distension the stomach which is noted to be filled with a large amount of debris. Ultimately with prolonged diligent effort, a stiff glidewire was advanced through the subtotal occlusion of the gastric antrum with the use of a short Kumpe catheter. Under imaged fluoroscopic guidance, the Kumpe catheter was exchanged for a coaxial 9-French gastrojejunostomy catheter which was advanced over the stiff Glidewire with end ultimately co terminating within the proximal jejunum. Contrast injection demonstrated appropriate positioning and functionality of both the gastric and jejunal lumens. Given the significant amount of debris within the stomach as well as patient's presenting complaint of excessive pericatheter leakage, an 18 gauge nasogastric 2 was advanced through the left narrowings to the level of the stomach under intermittent fluoroscopic guidance after the left naris  was anesthetized with viscous lidocaine. Postprocedural spot fluoroscopic images were obtained and the procedure was terminated. The nasogastric tube was affixed at the nose with retention tape. Dressings were applied. The patient tolerated procedure well without immediate postprocedural complication. IMPRESSION: 1. Successful fluoroscopic guided conversion of pull-through gastrostomy tube to a gastrojejunostomy catheter with jejunal limb terminating within the proximal jejunum. 2. Successful fluoroscopic guided placement of an 71 French nasogastric tube for the purposes of maximum gastric venting. PLAN: - Given the large amount of retained debris within the stomach, recommended the nasogastric tube stay in place for 1-2 days connected to low wall suction to allow for  optimal gastric venting. - The jejunal lumen is ready for immediate use for feeding and the gastric lumen may be utilized for venting purposes as indicated. 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 Electronically Signed   By: Sandi Mariscal M.D.   On: 04/24/2020 16:35   DG ABD ACUTE 2+V W 1V CHEST  Result Date: 04/24/2020 CLINICAL DATA:  Abdominal pain EXAM: DG ABDOMEN ACUTE WITH 1 VIEW CHEST COMPARISON:  Chest CT February 17, 2020; abdominal radiograph February 15, 2020 FINDINGS: AP chest: Port-A-Cath tip is at the cavoatrial junction. No pneumothorax. There is mild atelectasis in the right base. Lungs otherwise are clear. Heart size and pulmonary vascularity are normal. No adenopathy. There is aortic atherosclerosis. Supine and left lateral decubitus abdomen: Gastrostomy catheter in region of stomach. No bowel dilatation or air-fluid level to suggest bowel obstruction. No free air. There are surgical clips in right upper quadrant. IMPRESSION: Gastrostomy catheter in region of stomach. No bowel dilatation or air-fluid level to suggest bowel obstruction. No free air. Surgical clips right upper quadrant. Slight right base atelectasis. No edema or airspace opacity. Port-A-Cath tip in superior vena cava. Aortic Atherosclerosis (ICD10-I70.0). Electronically Signed   By: Lowella Grip III M.D.   On: 04/24/2020 10:15        Scheduled Meds: . dexamethasone  4 mg Per J Tube BID WC  . insulin aspart  0-9 Units Subcutaneous Q4H  . lisinopril  40 mg Per Tube Daily   Continuous Infusions: . sodium chloride 125 mL/hr at 04/25/20 0244  . potassium chloride       LOS: 1 day    Time spent: 35 mins.More than 50% of that time was spent in counseling and/or coordination of care.      Shelly Coss, MD [facility] P1/18/2022, 8:49 AM

## 2020-04-26 LAB — BASIC METABOLIC PANEL
Anion gap: 9 (ref 5–15)
BUN: 23 mg/dL (ref 8–23)
CO2: 26 mmol/L (ref 22–32)
Calcium: 8.4 mg/dL — ABNORMAL LOW (ref 8.9–10.3)
Chloride: 105 mmol/L (ref 98–111)
Creatinine, Ser: 0.61 mg/dL (ref 0.44–1.00)
GFR, Estimated: >60 mL/min (ref >60)
Glucose, Bld: 193 mg/dL — ABNORMAL HIGH (ref 70–99)
Potassium: 4 mmol/L (ref 3.5–5.1)
Sodium: 140 mmol/L (ref 135–145)

## 2020-04-26 LAB — MAGNESIUM: Magnesium: 1.9 mg/dL (ref 1.7–2.4)

## 2020-04-26 LAB — CBC WITH DIFFERENTIAL/PLATELET
Abs Immature Granulocytes: 0.04 10*3/uL (ref 0.00–0.07)
Basophils Absolute: 0 10*3/uL (ref 0.0–0.1)
Basophils Relative: 0 %
Eosinophils Absolute: 0 10*3/uL (ref 0.0–0.5)
Eosinophils Relative: 0 %
HCT: 38.8 % (ref 36.0–46.0)
Hemoglobin: 12.5 g/dL (ref 12.0–15.0)
Immature Granulocytes: 0 %
Lymphocytes Relative: 4 %
Lymphs Abs: 0.5 10*3/uL — ABNORMAL LOW (ref 0.7–4.0)
MCH: 30.1 pg (ref 26.0–34.0)
MCHC: 32.2 g/dL (ref 30.0–36.0)
MCV: 93.5 fL (ref 80.0–100.0)
Monocytes Absolute: 0.5 10*3/uL (ref 0.1–1.0)
Monocytes Relative: 4 %
Neutro Abs: 12.6 10*3/uL — ABNORMAL HIGH (ref 1.7–7.7)
Neutrophils Relative %: 92 %
Platelets: 104 10*3/uL — ABNORMAL LOW (ref 150–400)
RBC: 4.15 MIL/uL (ref 3.87–5.11)
RDW: 16.1 % — ABNORMAL HIGH (ref 11.5–15.5)
WBC: 13.7 10*3/uL — ABNORMAL HIGH (ref 4.0–10.5)
nRBC: 0 % (ref 0.0–0.2)

## 2020-04-26 LAB — GLUCOSE, CAPILLARY
Glucose-Capillary: 144 mg/dL — ABNORMAL HIGH (ref 70–99)
Glucose-Capillary: 155 mg/dL — ABNORMAL HIGH (ref 70–99)
Glucose-Capillary: 158 mg/dL — ABNORMAL HIGH (ref 70–99)
Glucose-Capillary: 164 mg/dL — ABNORMAL HIGH (ref 70–99)
Glucose-Capillary: 181 mg/dL — ABNORMAL HIGH (ref 70–99)
Glucose-Capillary: 186 mg/dL — ABNORMAL HIGH (ref 70–99)

## 2020-04-26 LAB — PHOSPHORUS: Phosphorus: 1.7 mg/dL — ABNORMAL LOW (ref 2.5–4.6)

## 2020-04-26 MED ORDER — PROSOURCE TF PO LIQD
45.0000 mL | Freq: Two times a day (BID) | ORAL | Status: DC
  Administered 2020-04-27 (×2): 45 mL
  Filled 2020-04-26 (×5): qty 45

## 2020-04-26 MED ORDER — VITAL HIGH PROTEIN PO LIQD
1000.0000 mL | ORAL | Status: DC
  Filled 2020-04-26: qty 1000

## 2020-04-26 MED ORDER — SODIUM CHLORIDE 0.9% FLUSH
10.0000 mL | INTRAVENOUS | Status: DC | PRN
Start: 2020-04-26 — End: 2020-04-30

## 2020-04-26 MED ORDER — FREE WATER
100.0000 mL | Status: DC
  Administered 2020-04-27 – 2020-04-30 (×20): 100 mL

## 2020-04-26 MED ORDER — OSMOLITE 1.2 CAL PO LIQD
1000.0000 mL | ORAL | Status: DC
  Administered 2020-04-27 – 2020-04-29 (×5): 1000 mL
  Filled 2020-04-26 (×7): qty 1000

## 2020-04-26 NOTE — Progress Notes (Signed)
[id number]) Hospitalized Hospice Patient Note  Bonnie Peterson is an [facility] hospice patient admitted 01/13/20 with a terminal diagnosis of intrahepatic cholangiocarcinoma.  Patient (pt) had been experiencing increased drainage from and around g-tube with pt complaining of increased pain at site.  Patient was transported to [facility] ED for evaluation and was admitted. [facility] was made aware before transport. Per Dr. Cherie Ouch with [facility] this is a related and covered admission.  Met with patient and daughter Bonnie Peterson at the bedside. [name] was asleep and appeared to be comfortable. Bonnie Peterson endorses concern about her N/V and what it means in the greater context of her illness. She is very much involved in her mothers care and is struggling with her continued decline. Provided support and actively listened.   V/S: 98 oral, 140/59, HR 70, RR 14, SPO2 92% on RA I&O: 855/450 Labs: WBC 13.7, plt 104 IVs/PRNs: solu-medrol 40 mg IV BID, D5NS @ 75 ml/hr, hydralazine 10 mg IV x 1, dilaudid 0.5 mg IV x 4  Problem List: - G-tube dysfunction - converted G tube to GJ tube in IR, NG tube placed to decompress stomach, NPO - metastatic cholangiocarcinoma - stable, no active treatment, comfort measures only - thrombocytopenia - stable, no overt s/s of bleeding  GOC: Under hospice services, she has an OOF DNR in the home. Bonnie Peterson is clear that they want to take her home and not return to the hospital. D/C planning: return home with her daughter Bonnie Peterson and hospice services Family: Bonnie Peterson updated at the bedside IDT: hospice team updated  Venia Carbon RN, BSN, [facility]iaison

## 2020-04-26 NOTE — Progress Notes (Signed)
Called [facility] and was told for the second time that there is no Kangaroo pump available. [facility] checked each unit and informed the nurses on each floor to alert them if one becomes available.

## 2020-04-26 NOTE — Plan of Care (Signed)
  Problem: Education: Goal: Knowledge of General Education information will improve Description: Including pain rating scale, medication(s)/side effects and non-pharmacologic comfort measures Outcome: Progressing   Problem: Clinical Measurements: Goal: Will remain free from infection Outcome: Progressing   Problem: Clinical Measurements: Goal: Diagnostic test results will improve Outcome: Progressing   Problem: Coping: Goal: Level of anxiety will decrease Outcome: Progressing

## 2020-04-26 NOTE — Progress Notes (Signed)
Initial Nutrition Assessment  DOCUMENTATION CODES:   Not applicable  INTERVENTION:  - diet advancement when/if feasible. - will order TF via J-tube and monitor for ability to change to be per G-tube.  - Osmolite 1.2 @ 20 ml/hr to advance by 10 ml every 8 hours to reach goal rate of 60 ml/hr. - at goal rate, this regimen will provide 1728 kcal, 80 grams protein, and 1181 ml free water.  - will order 100 ml free water every 4 hours (600 ml/day).   NUTRITION DIAGNOSIS:   Increased nutrient needs related to catabolic illness,cancer and cancer related treatments as evidenced by estimated needs.  GOAL:   Patient will meet greater than or equal to 90% of their needs  MONITOR:   Diet advancement,TF tolerance,Labs,Weight trends,I & O's  REASON FOR ASSESSMENT:   Consult Enteral/tube feeding initiation and management  ASSESSMENT:   75 year old female with medical history of metastatic cholangiocarcinoma status post G-tube placement, COPD, and OSA. She is currently enrolled on hospice at home. She presented to the ED with complaints of worsening abdominal pain. During previous admission she was encouraged to consume liquids only, no solid foods. Abdominal x-ray in the ED did not show any evidence of bowel obstruction. She underwent successful conversion of G-tube to G-J tube. NGT place for LIS d/t large volume of fluid noted in the stomach.  She has been NPO since admission. G-tube converted to G-J tube on 1/17. NGT in place to LIS with ~200 ml dark brown output at this time.  Patient sleeping through entirety of RD visit. Daughter was at bedside and provided all information. It sounds like patient lives with her (daughter). Daughter cooks for her daily. Patient was drinking Boost and Glucerna Shakes at home and she was eating items such as scrambled eggs and toast. Her daughter was also cooking other soft, easy-to-chew things for her.   Daughter denies patient having any issues with blood  sugar previously but that patient liked the taste of Glucerna Shakes and also preferred to drink them because she felt that Boost would raise her blood sugar too much.   At home, family was using a syringe to pull fluid via G-tube. Patient was having nausea and vomiting PTA but had not vomited in a prolonged, but unknown, amount of time. Story was kind of choppy at times and timeline was often difficult to follow.   Discussed J-tube and plans surrounding TF and TF regimen.   Weight on 02/25/20 was 142 lb and weight on 01/11/20 was 148 lb. She has not been weighed since 02/25/20.   MD note today states continue NGT x12-24 more hours and that TF can likely be run through G-tube after NGT removed.    Labs reviewed; CBGs: 181, 186, 164, and 144 mg/dl, Ca: 8.4 mg/dl. Medications reviewed; sliding scale novolog, 40 mg solu-medrol BID, 40 mg IV protonix/day, 10 mEq IV KCl x4 runs 1/18. IVF; D5-NS @ 75 ml/hr (306 kcal).     NUTRITION - FOCUSED PHYSICAL EXAM:  unable to complete as patient was sleeping.   Diet Order:   Diet Order            Diet NPO time specified  Diet effective now                 EDUCATION NEEDS:   Education needs have been addressed  Skin:  Skin Assessment: Reviewed RN Assessment  Last BM:  1/15  Height:   Ht Readings from Last 1 Encounters:  02/25/20  5\' 3"  (1.6 m)    Weight:   Wt Readings from Last 1 Encounters:  02/25/20 64.8 kg    Estimated Nutritional Needs:  Kcal:  1700-1900 kcal Protein:  80-90 grams Fluid:  >/= 2 L/day      Jarome Matin, MS, RD, LDN, CNSC Inpatient Clinical Dietitian RD pager # available in [facility]  After hours/weekend pager # available in [facility]

## 2020-04-26 NOTE — Progress Notes (Signed)
PROGRESS NOTE    Bonnie Peterson  DZH:[id number] DOB: 1946/02/24 DOA: 04/24/2020 PCP: Lattie Haw, MD   Chief Complain: Abdominal pain  Brief Narrative: Patient is a 75 year old female with history of metastatic cholangiocarcinoma status post G-tube placement, COPD, OSA who is currently enrolled on hospice at home presents to the emergency department with complaints of worsening abdominal pain.  She has a G-tube in place and noted change in the color of secretions.  There was drainage from G-tube site for almost a week.  On her last admission, she was advised not to take any solid food but take only liquids on discharge.  She was continuing to take solid food by mouth.  Abdominal x-ray done in the emergency department did not show any evidence of bowel obstruction.  IR consulted for changing the tube.  She underwent successful conversion of G-tube to [place] tube.  Since she was noted to have large fluid and debris seen in the stomach, NG tube was placed.  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  There was drainage from G-tube site for almost a week.  On her last admission, she was advised not to take any solid food but take only liquids on discharge.  She was continuing to take solid food by mouth.  Abdominal x-ray done in the emergency department did not show any evidence of bowel obstruction.  IR consulted for changing the tube.  She underwent successful conversion of G-tube to [place] tube.  Since she was noted to have large fluid and debris seen in the stomach, NG  tube was placed.   NG tube still draining dark fluid.  We anticipate that the NG tube should be continued for next 12-24 hrs in order to clear the debris from the stomach.  When the drain from NG tube is less, will should probably start  on feedingthrough the G-tube . monitor for aspiration.  Currently her respiratory status is stable. We will continue to recommend not to have solid food by mouth.  Aspiration precautions.  Metastatic cholangiocarcinoma: Currently enrolled in hospice.  Was following with oncology in the past.  Hypertension: Currently blood pressure stable.  Continue current medications  COPD: She is not in respiratory distress but auscultation revealed bilateral wheezes.  Started on Solu-Medrol.  Wheezing is much better today.  Currently on room air.  Diabetes type 2: Continue sliding scale insulin.  Monitor blood sugars.  Hypokalemia: Supplemented potassium  Thrombocytopenia: Most likely chronic and associated  with malignancy.  Continue to monitor  History of sleep apnea: Not on CPAP         DVT prophylaxis:SCD Code Status: partial Family Communication: called daughter on phone on 04/27/19 Status is: Inpatient  Remains inpatient appropriate because:Inpatient level of care appropriate due to severity of illness   Dispo: The patient is from: Home              Anticipated d/c is to: Home              Anticipated d/c date is: 1-2 days  Patient currently is not medically stable to d/c.  Still having output from NG tube drain.  Consultants: iR  Procedures:As above  Antimicrobials:  Anti-infectives (From admission, onward)   None      Subjective: Patient seen and examined the bedside this morning.  Hemodynamically stable.  Breathing is much better today.  On room air.  Denies any shortness of breath or wheezes.  NG tube still draining brown fluid.  Objective: Vitals:   04/25/20 1802 04/25/20 2110 04/26/20 0126 04/26/20 0542  BP: (!) 120/53  (!) 102/48 (!) 141/55 (!) 157/69  Pulse: 62 62 61 (!) 59  Resp: 16 16 16 16   Temp: 98.5 F (36.9 C) 97.6 F (36.4 C) (!) 97.5 F (36.4 C) 98.1 F (36.7 C)  TempSrc:   Oral Axillary  SpO2: 97% 100% 99% 93%    Intake/Output Summary (Last 24 hours) at 04/26/2020 0826 Last data filed at 04/26/2020 0600 Gross per 24 hour  Intake 1255 ml  Output 850 ml  Net 405 ml   There were no vitals filed for this visit.  Examination:  General exam: Chronically ill looking, thin built, malnourished, cachectic HEENT: NG tube Respiratory system: No wheezes or crackles Cardiovascular system: S1 & S2 heard, RRR. No JVD, murmurs, rubs, gallops or clicks. Gastrointestinal system: Abdomen is nondistended, soft and nontender. No organomegaly or masses felt. Normal bowel sounds heard.PEG Central nervous system: Alert and oriented.  Extremities: No edema, no clubbing ,no cyanosis Skin: No rashes, lesions or ulcers,no icterus ,no pallor   Data Reviewed: I have personally reviewed following labs and imaging studies  CBC: Recent Labs  Lab 04/24/20 0932 04/25/20 0250 04/26/20 0410  WBC 5.2 5.4 13.7*  NEUTROABS 3.7  --  12.6*  HGB 12.7 12.6 12.5  HCT 38.6 38.5 38.8  MCV 90.8 91.0 93.5  PLT 117* 116* 283*   Basic Metabolic Panel: Recent Labs  Lab 04/24/20 0932 04/25/20 0250 04/26/20 0410  NA 138 138 140  K 3.5 3.3* 4.0  CL 97* 100 105  CO2 24 26 26   GLUCOSE 128* 128* 193*  BUN 22 23 23   CREATININE 0.67 0.57 0.61  CALCIUM 8.9 8.4* 8.4*   GFR: CrCl cannot be calculated (Unknown ideal weight.). Liver Function Tests: Recent Labs  Lab 04/24/20 0932  AST 53*  ALT 58*  ALKPHOS 129*  BILITOT 1.0  PROT 5.9*  ALBUMIN 2.8*   Recent Labs  Lab 04/24/20 0932  LIPASE 16   No results for input(s): AMMONIA in the last 168 hours. Coagulation Profile: Recent Labs  Lab 04/24/20 0932  INR 1.1   Cardiac Enzymes: No results for input(s): CKTOTAL, CKMB, CKMBINDEX, TROPONINI in the last  168 hours. BNP (last 3 results) No results for input(s): PROBNP in the last 8760 hours. HbA1C: No results for input(s): HGBA1C in the last 72 hours. CBG: Recent Labs  Lab 04/25/20 1655 04/25/20 2111 04/26/20 0009 04/26/20 0332 04/26/20 0721  GLUCAP 139* 162* 181* 186* 164*   Lipid Profile: No results for input(s): CHOL, HDL, LDLCALC, TRIG, CHOLHDL, LDLDIRECT in the last 72 hours. Thyroid Function Tests: No results for input(s): TSH, T4TOTAL, FREET4, T3FREE, THYROIDAB in the last 72 hours. Anemia Panel: No results for input(s): VITAMINB12, FOLATE, FERRITIN, TIBC, IRON, RETICCTPCT in the last 72 hours. Sepsis Labs: No results for input(s): PROCALCITON, LATICACIDVEN in the last 168 hours.  Recent Results (from the past 240 hour(s))  SARS CORONAVIRUS 2 (TAT 6-24 HRS) Nasopharyngeal Nasopharyngeal Swab     Status: None  Collection Time: 04/24/20  1:41 PM   Specimen: Nasopharyngeal Swab  Result Value Ref Range Status   SARS Coronavirus 2 NEGATIVE NEGATIVE Final    Comment: (NOTE) SARS-CoV-2 target nucleic acids are NOT DETECTED.  The SARS-CoV-2 RNA is generally detectable in upper and lower respiratory specimens during the acute phase of infection. Negative results do not preclude SARS-CoV-2 infection, do not rule out co-infections with other pathogens, and should not be used as the sole basis for treatment or other patient management decisions. Negative results must be combined with clinical observations, patient history, and epidemiological information. The expected result is Negative.  Fact Sheet for Patients: SugarRoll.be  Fact Sheet for Healthcare Providers: https://www.woods-mathews.com/  This test is not yet approved or cleared by the [place] FDA and  has been authorized for detection and/or diagnosis of SARS-CoV-2 by FDA under an Emergency Use Authorization (EUA). This EUA will remain  in effect (meaning this test can  be used) for the duration of the COVID-19 declaration under Se ction 564(b)(1) of the Act, 21 U.S.C. section 360bbb-3(b)(1), unless the authorization is terminated or revoked sooner.  Performed at [facility], [place] [address] [place], [place] [address]          Radiology Studies: DG Abd 1 View  Result Date: 04/25/2020 CLINICAL DATA:  Abdominal distention. EXAM: ABDOMEN - 1 VIEW COMPARISON:  IR images 04/24/2020.  Abdomen 04/24/2020 FINDINGS: Interim placement of NG tube, its tip is over the stomach. Gastrojejunostomy catheter in stable position. Surgical clips right upper quadrant. Oral contrast noted what appears to be colon. No bowel distention. Degenerative change lumbar spine. IMPRESSION: 1. Interim placement of NG tube, its tip is over the stomach. Gastrojejunostomy catheter in stable position. 2. Oral contrast noted in what appears to be colon. No bowel distention. Electronically Signed   By: [place]   On: 04/25/2020 06:30   IR GASTR TUBE CONVERT GASTR-JEJ PER W/FL MOD SED  Result Date: 04/24/2020 INDICATION: History of metastatic cholangiocarcinoma, currently on hospice. Patient underwent image guided placement of a pull-through venting gastrostomy tube on 02/21/2020 for palliative purposes. Attempt was made at the time of gastrostomy tube placement to convert the gastrostomy tube to a gastrojejunostomy catheter however this was unsuccessful secondary to subtotal occlusion of the gastric antrum. Patient returns today with complaint of excessive pericatheter leakage and as such presents for fluoroscopic guided gastrostomy tube evaluation and management. EXAM: 1. FLUOROSCOPIC GUIDED CONVERSION OF EXISTING GASTROSTOMY TUBE TO A GASTROJEJUNOSTOMY TUBE 2. FLUOROSCOPIC GUIDED PLACEMENT OF A NASOGASTRIC TUBE. COMPARISON:  Image guided pull-through gastrostomy tube placement-02/21/2020 MEDICATIONS: None. CONTRAST:  78m OMNIPAQUE IOHEXOL 300 MG/ML SOLN administered into the  gastric lumen and proximal small bowel FLUOROSCOPY TIME:  22 minutes, 24 seconds (3956mGy) COMPLICATIONS: None. PROCEDURE: Informed written consent was obtained from the patient after a discussion of the risks and benefits. The upper abdomen and the external portion of the existing gastrostomy tube was prepped and draped in the usual sterile fashion, and a sterile drape was applied covering the operative field. Maximum barrier sterile technique with sterile gowns and gloves were used for the procedure. A timeout was performed prior to the initiation of the procedure. The gastrostomy tube was injected with a small amount of contrast demonstrating significant distension the stomach which is noted to be filled with a large amount of debris. Ultimately with prolonged diligent effort, a stiff glidewire was advanced through the subtotal occlusion of the gastric antrum with the use of a short [place]  catheter. Under imaged fluoroscopic guidance, the [place] catheter was exchanged for a coaxial 9-French gastrojejunostomy catheter which was advanced over the stiff Glidewire with end ultimately co terminating within the proximal jejunum. Contrast injection demonstrated appropriate positioning and functionality of both the gastric and jejunal lumens. Given the significant amount of debris within the stomach as well as patient's presenting complaint of excessive pericatheter leakage, an 18 gauge nasogastric 2 was advanced through the left narrowings to the level of the stomach under intermittent fluoroscopic guidance after the left naris was anesthetized with viscous lidocaine. Postprocedural spot fluoroscopic images were obtained and the procedure was terminated. The nasogastric tube was affixed at the nose with retention tape. Dressings were applied. The patient tolerated procedure well without immediate postprocedural complication. IMPRESSION: 1. Successful fluoroscopic guided conversion of pull-through gastrostomy tube to a  gastrojejunostomy catheter with jejunal limb terminating within the proximal jejunum. 2. Successful fluoroscopic guided placement of an 102 French nasogastric tube for the purposes of maximum gastric venting. PLAN: - Given the large amount of retained debris within the stomach, recommended the nasogastric tube stay in place for 1-2 days connected to low wall suction to allow for optimal gastric venting. - The jejunal lumen is ready for immediate use for feeding and the gastric lumen may be utilized for venting purposes as indicated. 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 Electronically Signed   By: Sandi Mariscal M.D.   On: 04/24/2020 16:35   IR INTRO LONG GI TUBE  Result Date: 04/24/2020 INDICATION: History of metastatic cholangiocarcinoma, currently on hospice. Patient underwent image guided placement of a pull-through venting gastrostomy tube on 02/21/2020 for palliative purposes. Attempt was made at the time of gastrostomy tube placement to convert the gastrostomy tube to a gastrojejunostomy catheter however this was unsuccessful secondary to subtotal occlusion of the gastric antrum. Patient returns today with complaint of excessive pericatheter leakage and as such presents for fluoroscopic guided gastrostomy tube evaluation and management. EXAM: 1. FLUOROSCOPIC GUIDED CONVERSION OF EXISTING GASTROSTOMY TUBE TO A GASTROJEJUNOSTOMY TUBE 2. FLUOROSCOPIC GUIDED PLACEMENT OF A NASOGASTRIC TUBE. COMPARISON:  Image guided pull-through gastrostomy tube placement-02/21/2020 MEDICATIONS: None. CONTRAST:  42m OMNIPAQUE IOHEXOL 300 MG/ML SOLN administered into the gastric lumen and proximal small bowel FLUOROSCOPY TIME:  22 minutes, 24 seconds (3440mGy) COMPLICATIONS: None. PROCEDURE: Informed written consent was obtained from the patient after a discussion of the risks and benefits. The upper  abdomen and the external portion of the existing gastrostomy tube was prepped and draped in the usual sterile fashion, and a sterile drape was applied covering the operative field. Maximum barrier sterile technique with sterile gowns and gloves were used for the procedure. A timeout was performed prior to the initiation of the procedure. The gastrostomy tube was injected with a small amount of contrast demonstrating significant distension the stomach which is noted to be filled with a large amount of debris. Ultimately with prolonged diligent effort, a stiff glidewire was advanced through the subtotal occlusion of the gastric antrum with the use of a short Kumpe catheter. Under imaged fluoroscopic guidance, the Kumpe catheter was exchanged for a coaxial 9-French gastrojejunostomy catheter which was advanced over the stiff Glidewire with end ultimately co terminating within the proximal jejunum. Contrast injection demonstrated appropriate positioning and functionality of both the gastric and jejunal lumens. Given the significant amount of debris within the stomach as well as patient's presenting  complaint of excessive pericatheter leakage, an 18 gauge nasogastric 2 was advanced through the left narrowings to the level of the stomach under intermittent fluoroscopic guidance after the left naris was anesthetized with viscous lidocaine. Postprocedural spot fluoroscopic images were obtained and the procedure was terminated. The nasogastric tube was affixed at the nose with retention tape. Dressings were applied. The patient tolerated procedure well without immediate postprocedural complication. IMPRESSION: 1. Successful fluoroscopic guided conversion of pull-through gastrostomy tube to a gastrojejunostomy catheter with jejunal limb terminating within the proximal jejunum. 2. Successful fluoroscopic guided placement of an 54 French nasogastric tube for the purposes of maximum gastric venting. PLAN: - Given the large  amount of retained debris within the stomach, recommended the nasogastric tube stay in place for 1-2 days connected to low wall suction to allow for optimal gastric venting. - The jejunal lumen is ready for immediate use for feeding and the gastric lumen may be utilized for venting purposes as indicated. 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 Electronically Signed   By: Sandi Mariscal M.D.   On: 04/24/2020 16:35   DG ABD ACUTE 2+V W 1V CHEST  Result Date: 04/24/2020 CLINICAL DATA:  Abdominal pain EXAM: DG ABDOMEN ACUTE WITH 1 VIEW CHEST COMPARISON:  Chest CT February 17, 2020; abdominal radiograph February 15, 2020 FINDINGS: AP chest: Port-A-Cath tip is at the cavoatrial junction. No pneumothorax. There is mild atelectasis in the right base. Lungs otherwise are clear. Heart size and pulmonary vascularity are normal. No adenopathy. There is aortic atherosclerosis. Supine and left lateral decubitus abdomen: Gastrostomy catheter in region of stomach. No bowel dilatation or air-fluid level to suggest bowel obstruction. No free air. There are surgical clips in right upper quadrant. IMPRESSION: Gastrostomy catheter in region of stomach. No bowel dilatation or air-fluid level to suggest bowel obstruction. No free air. Surgical clips right upper quadrant. Slight right base atelectasis. No edema or airspace opacity. Port-A-Cath tip in superior vena cava. Aortic Atherosclerosis (ICD10-I70.0). Electronically Signed   By: Lowella Grip III M.D.   On: 04/24/2020 10:15        Scheduled Meds: . Chlorhexidine Gluconate Cloth  6 each Topical Daily  . insulin aspart  0-9 Units Subcutaneous Q4H  . methylPREDNISolone (SOLU-MEDROL) injection  40 mg Intravenous Q12H  . pantoprazole (PROTONIX) IV  40 mg Intravenous Q24H   Continuous Infusions: . dextrose 5 % and 0.9% NaCl 75 mL/hr at 04/26/20  0600     LOS: 2 days    Time spent: 35 mins.More than 50% of that time was spent in counseling and/or coordination of care.      Shelly Coss, MD [facility] P1/19/2022, 8:26 AM

## 2020-04-26 NOTE — TOC Initial Note (Signed)
Transition of Care [facility]) - Initial/Assessment Note    Patient Details  Name: Bonnie Peterson MRN: [id number] Date of Birth: [birth year]  [facility]) CM/SW Contact:    Lia Hopping, [place] Phone Number: 04/26/2020, 11:32 AM  Clinical Narrative:    Patient active with [facility] admitted under Hospice services.                CSW reached out to the patient daughter [name]. Introduced role.   TOC staff will this patient for transportation [facility]) needs home when medically stable.     Barriers to Discharge: Continued Medical Work up   Patient Goals and CMS Choice Patient states their goals for this hospitalization and ongoing recovery are:: Daughter [name], " get her back home."      Expected Discharge Plan and Services                                                Prior Living Arrangements/Services                       Activities of Daily Living Home Assistive Devices/Equipment: Bedside commode/3-in-1,Cane (specify quad or straight),CBG Meter,Eyeglasses,Shower chair with back,Walker (specify type),Other (Comment) (G-tube) ADL Screening (condition at time of admission) Patient's cognitive ability adequate to safely complete daily activities?: Yes Is the patient deaf or have difficulty hearing?: No Does the patient have difficulty seeing, even when wearing glasses/contacts?: No Does the patient have difficulty concentrating, remembering, or making decisions?: No Patient able to express need for assistance with ADLs?: Yes Does the patient have difficulty dressing or bathing?: Yes Independently performs ADLs?: No Communication: Independent Dressing (OT): Needs assistance Is this a change from baseline?: Pre-admission baseline Grooming: Independent Feeding: Independent Bathing: Needs assistance Is this a change from baseline?: Pre-admission baseline Toileting: Needs assistance Is this a change from baseline?: Pre-admission  baseline In/Out Bed: Needs assistance Is this a change from baseline?: Pre-admission baseline Walks in Home: Needs assistance Is this a change from baseline?: Pre-admission baseline Does the patient have difficulty walking or climbing stairs?: Yes (secondary to weakness) Weakness of Legs: Both Weakness of Arms/Hands: None  Permission Sought/Granted                  Emotional Assessment              Admission diagnosis:  Abdominal distension [R14.0] Gastrostomy tube dysfunction (HCC) [K94.23] PEG (percutaneous endoscopic gastrostomy) status ([place]) [Z93.1] Abdominal pain, unspecified abdominal location [R10.9] Patient Active Problem List   Diagnosis Date Noted   Gastrostomy tube dysfunction ([place]) 04/24/2020   Pedal edema 02/27/2020   Palliative care by specialist    General weakness    Hospice care patient    Non-intractable vomiting    Acute esophagitis    Cholangiocarcinoma ([place]) 02/15/2020   Intractable nausea and vomiting 01/14/2020   Rectal bleeding    Depression 07/19/2019   Goals of care, counseling/discussion 06/11/2019   Blood in stool 06/11/2019   Acute pain of left lower extremity 01/05/2019   Gastroesophageal reflux disease without esophagitis 06/03/2018   Lung nodules 05/04/2018   Type 2 diabetes mellitus without complication, without long-term current use of insulin ([place]) 01/05/2017   Healthcare maintenance 01/24/2016   Back pain 11/23/2015   Port catheter in place 11/10/2015   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   Cholangiocarcinoma metastatic  to liver [facility]) 03/27/2015   History of resection of liver 02/10/2015   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   Inadequate sleep hygiene 08/22/2014   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   IBS (irritable bowel syndrome) 03/22/2013   Liver mass, left lobe 02/24/2013   Osteoporosis 10/15/2012   Vitamin D deficiency 12/23/2011   Cigarette smoker  10/12/2010   Chronic obstructive pulmonary disease ([place]) 06/14/2010   Obstructive sleep apnea 04/27/2009   OBESITY, UNSPECIFIED 04/26/2009   Hyperlipidemia 11/25/2007   GOUT NOS 12/24/2006   Recurrent major depressive disorder ([place]) 06/05/2006   Anxiety 06/05/2006   Tobacco use 06/05/2006   HYPERTENSION, BENIGN SYSTEMIC 06/05/2006   Osteoarthritis 06/05/2006   Generalized anxiety disorder 06/05/2006   PCP:  Lattie Haw, MD Pharmacy:   [facility], [place] [address] Phone: [phone] Fax: [phone]  [id number] [address] [place] [id number]: [phone]: [phone]    Social Determinants of Health (SDOH) Interventions    Readmission Risk Interventions No flowsheet data found.

## 2020-04-27 ENCOUNTER — Inpatient Hospital Stay (HOSPITAL_COMMUNITY)

## 2020-04-27 LAB — GLUCOSE, CAPILLARY
Glucose-Capillary: 143 mg/dL — ABNORMAL HIGH (ref 70–99)
Glucose-Capillary: 154 mg/dL — ABNORMAL HIGH (ref 70–99)
Glucose-Capillary: 168 mg/dL — ABNORMAL HIGH (ref 70–99)
Glucose-Capillary: 181 mg/dL — ABNORMAL HIGH (ref 70–99)
Glucose-Capillary: 182 mg/dL — ABNORMAL HIGH (ref 70–99)
Glucose-Capillary: 188 mg/dL — ABNORMAL HIGH (ref 70–99)
Glucose-Capillary: 226 mg/dL — ABNORMAL HIGH (ref 70–99)

## 2020-04-27 LAB — BASIC METABOLIC PANEL
Anion gap: 7 (ref 5–15)
BUN: 18 mg/dL (ref 8–23)
CO2: 26 mmol/L (ref 22–32)
Calcium: 8.4 mg/dL — ABNORMAL LOW (ref 8.9–10.3)
Chloride: 109 mmol/L (ref 98–111)
Creatinine, Ser: 0.73 mg/dL (ref 0.44–1.00)
GFR, Estimated: >60 mL/min (ref >60)
Glucose, Bld: 181 mg/dL — ABNORMAL HIGH (ref 70–99)
Potassium: 3.9 mmol/L (ref 3.5–5.1)
Sodium: 142 mmol/L (ref 135–145)

## 2020-04-27 LAB — CBC WITH DIFFERENTIAL/PLATELET
Abs Immature Granulocytes: 0.06 10*3/uL (ref 0.00–0.07)
Basophils Absolute: 0 10*3/uL (ref 0.0–0.1)
Basophils Relative: 0 %
Eosinophils Absolute: 0 10*3/uL (ref 0.0–0.5)
Eosinophils Relative: 0 %
HCT: 40.3 % (ref 36.0–46.0)
Hemoglobin: 12.7 g/dL (ref 12.0–15.0)
Immature Granulocytes: 0 %
Lymphocytes Relative: 3 %
Lymphs Abs: 0.5 10*3/uL — ABNORMAL LOW (ref 0.7–4.0)
MCH: 30.2 pg (ref 26.0–34.0)
MCHC: 31.5 g/dL (ref 30.0–36.0)
MCV: 95.7 fL (ref 80.0–100.0)
Monocytes Absolute: 0.6 10*3/uL (ref 0.1–1.0)
Monocytes Relative: 4 %
Neutro Abs: 14.2 10*3/uL — ABNORMAL HIGH (ref 1.7–7.7)
Neutrophils Relative %: 93 %
Platelets: 90 10*3/uL — ABNORMAL LOW (ref 150–400)
RBC: 4.21 MIL/uL (ref 3.87–5.11)
RDW: 16.6 % — ABNORMAL HIGH (ref 11.5–15.5)
WBC: 15.3 10*3/uL — ABNORMAL HIGH (ref 4.0–10.5)
nRBC: 0 % (ref 0.0–0.2)

## 2020-04-27 LAB — PHOSPHORUS
Phosphorus: 1.5 mg/dL — ABNORMAL LOW (ref 2.5–4.6)
Phosphorus: 2.5 mg/dL (ref 2.5–4.6)

## 2020-04-27 LAB — MAGNESIUM
Magnesium: 2 mg/dL (ref 1.7–2.4)
Magnesium: 2 mg/dL (ref 1.7–2.4)

## 2020-04-27 MED ORDER — LISINOPRIL 20 MG PO TABS
40.0000 mg | ORAL_TABLET | Freq: Every day | ORAL | Status: DC
  Filled 2020-04-27: qty 2

## 2020-04-27 MED ORDER — CLONIDINE HCL 0.3 MG/24HR TD PTWK: 0.3000 mg | MEDICATED_PATCH | TRANSDERMAL | Status: DC

## 2020-04-27 MED ORDER — PREDNISONE 20 MG PO TABS: 40.0000 mg | ORAL_TABLET | Freq: Every day | ORAL | Status: DC

## 2020-04-27 MED ORDER — PREDNISONE 20 MG PO TABS
40.0000 mg | ORAL_TABLET | Freq: Every day | ORAL | Status: DC
  Administered 2020-04-28 – 2020-04-30 (×3): 40 mg
  Filled 2020-04-27 (×3): qty 2

## 2020-04-27 MED ORDER — LISINOPRIL 20 MG PO TABS
40.0000 mg | ORAL_TABLET | Freq: Every day | ORAL | Status: DC
  Administered 2020-04-27 – 2020-04-30 (×4): 40 mg
  Filled 2020-04-27 (×3): qty 2

## 2020-04-27 MED ORDER — POTASSIUM PHOSPHATES 15 MMOLE/5ML IV SOLN
20.0000 mmol | Freq: Once | INTRAVENOUS | Status: AC
  Administered 2020-04-27: 20 mmol via INTRAVENOUS
  Filled 2020-04-27: qty 6.67

## 2020-04-27 MED ORDER — AMLODIPINE BESYLATE 10 MG PO TABS: 10.0000 mg | ORAL_TABLET | Freq: Every day | ORAL | Status: DC

## 2020-04-27 MED ORDER — SODIUM CHLORIDE 0.9 % IV SOLN
3.0000 g | Freq: Four times a day (QID) | INTRAVENOUS | Status: DC
  Administered 2020-04-27 – 2020-04-30 (×12): 3 g via INTRAVENOUS
  Filled 2020-04-27: qty 8
  Filled 2020-04-27 (×6): qty 3
  Filled 2020-04-27: qty 0.15
  Filled 2020-04-27 (×2): qty 3
  Filled 2020-04-27: qty 8
  Filled 2020-04-27 (×2): qty 3

## 2020-04-27 NOTE — Progress Notes (Signed)
Pt has new onset bilateral swelling at jaw sides to ears, right side measuring 7 cm x 5 cm x 2 cm. Paged floor coverage.

## 2020-04-27 NOTE — Evaluation (Addendum)
SLP Cancellation Note  Patient Details Name: AMELIANNA MELLER MRN: [id number] DOB: 05/20/1945   Cancelled treatment:       Reason Eval/Treat Not Completed: Other (comment) Order for swallow evaluation received, will see pt next date. Thanks for this consult.   Macario Golds 04/27/2020, 12:32 PM   Kathleen Lime, MS [facility] SLP Acute Rehab Services Office [phone] Pager [phone]

## 2020-04-27 NOTE — Progress Notes (Signed)
[facility] [facility]) Hospitalized Hospice Patient Note   Bonnie Peterson is an [facility] hospice patient admitted 01/13/20 with a terminal diagnosis of intrahepatic cholangiocarcinoma.  Patient (pt) had been experiencing increased drainage from and around g-tube with pt complaining of increased pain at site.  Patient was transported to [facility] ED for evaluation and was admitted. [facility] was made aware before transport. Per Dr. Cherie Ouch with [facility] this is a related and covered admission.    Met with patient and daughter Bonnie Peterson at the bedside. Ms. Witman kept eyes closed throughout visit but responded appropriately with one word answers.  When asked how she was feeling pt stated, "Just very tired,"  denied nausea and pain at this time.  Respirations even but somewhat labored, some grunting noted on exhales.  NG tune discontinued and tube feeds have begun. Some swelling noted to bilat face. Provided actively listening to Bonnie Peterson share about frustrations with visitation policy and her concerns about her mother's decline.  Bonnie Peterson shares that she understands her mother "has to go sometime" but shares belief that she will overcome this new diagnosis of pneumonia.  Bonnie Peterson shares that her mother is at peace, has already arranged her funeral and chosen the music she wants to have, but that she wants to get better and come home.  Patient verbalized agreement with this.  Contact information for [facility] liaison team left at bedside with [name]. Pt is appropriate for continued GIP status due to ongoing IV antibiotic therapy due to ew diagnosis of aspiration PNA.   V/S: 97.5 oral, 174/73, HR 84, RR 16,  SPO2 97% on RA I&O: 1752/80 (charted) Labs: Ca 8.4, Phos 1.5, WBC 15.3, plt 90, Neut # 14.2, lymph # 0.5 IVs/PRNs: solu-medrol 40 mg IV BID (now dc'ed),  D5NS @ 75 ml/hr (now dc'ed), Unasyn 3g PIV q6h, potassium phosphate 20 mmol PIV once, hydralazine 10 mg IV PRN HTN x 2, dilaudid 0.5 mg IV PRN severe pain x  5 Diagnostics:  Portable Chest Xray:  IMPRESSION: 1. RIGHT lower lobe airspace disease suspicious for pneumonia potentially associated with small effusion. 2. Suspected LEFT lower lobe atelectasis versus early infection in this area. 3. Cardiomegaly. 4. Patient's gastrojejunostomy tube is not well seen on the current study.  CT Maxillofacial: IMPRESSION: Prominent parotid bilaterally right greater than left without edema or mass.   Left sphenoid sinus mucocele.   Normal TMJ bilaterally      Problem List: - G-tube dysfunction:  converted G tube to [place] tube in IR, NG removed, tube feeds started, NPO -aspiration PNA: Unasyn PIV started, statting well on RA -Concern for parotid swelling: CT reassuring, no masses seen only generalized swelling - metastatic cholangiocarcinoma - stable, no active treatment, comfort measures only - thrombocytopenia - stable, no overt s/s of bleeding   GOC: Under hospice services, she has an Prue DNR in the home. Bonnie Peterson is clear that they want to take her home and not return to the hospital. D/C planning: return home with her daughter Bonnie Peterson and hospice services Family: Bonnie Peterson updated at the bedside IDT: hospice team updated   Thank you for the opportunity to participate in this patient's care.  Domenic Moras, BSN, RN [facility] Liaison (listed on [place] under Hospice[facility])    [phone] (24h on call)

## 2020-04-27 NOTE — Progress Notes (Signed)
PROGRESS NOTE    Bonnie Peterson  ZOX:[id number] DOB: 1945/12/13 DOA: 04/24/2020 PCP: Lattie Haw, MD   Chief Complain: Abdominal pain  Brief Narrative: Patient is a 75 year old female with history of metastatic cholangiocarcinoma status post G-tube placement, COPD, OSA who is currently enrolled on hospice at home presents to the emergency department with complaints of worsening abdominal pain.  She has a G-tube in place and noted change in the color of secretions.  There was drainage from G-tube site for almost a week.  On her last admission, she was advised not to take any solid food but take only liquids on discharge.  She was continuing to take solid food by mouth.  Abdominal x-ray done in the emergency department did not show any evidence of bowel obstruction.  IR consulted for changing the tube.  She underwent successful conversion of G-tube to [place] tube.  Since she was noted to have large fluid and debris seen in the stomach, NG tube was placed.  NG tube has been removed now and she has been started feeding through G-tube.  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  There was drainage from G-tube site for almost a week.  On her last admission, she was advised not to take any solid food but take only liquids on discharge.  She was continuing to take solid food by mouth.  Abdominal x-ray done in the emergency department did not show any evidence of bowel obstruction.  IR consulted for  changing the tube.  She underwent successful conversion of G-tube to [place] tube.  Since she was noted to have large fluid and debris seen in the stomach, NG tube was placed.   Drain from NG tube is less, so NG tube removed today.  Aspiration pneumonia: Chest x-ray done today showed possible right lower lobe pneumonia.  Most likely aspiration.  Will start on Unasyn.  She is not septic, no febrile.  Currently she is maintaining her saturation on room air.  We will continue to recommend not to have solid food by mouth.  Aspiration precautions.  Speech therapy consulted.  We will change antibiotics to oral on discharge.  Metastatic cholangiocarcinoma: Currently enrolled in hospice.  Was following with oncology in the past.  Hypertension: Continue as needed medications for severe hypertension.  Home medication will be restarted.  COPD: She is not in respiratory distress but auscultation revealed bilateral wheezes on admission day .  Started on Solu-Medrol,will be changed to predisone  Wheezing is much better .  Currently on room air.  Diabetes type 2: Continue sliding scale insulin.  Monitor blood sugars.  Hypokalemia: Being monitored and supplemented  Thrombocytopenia: Most likely chronic and associated  with malignancy.  Continue to monitor  Leukocytosis: Could be associated with steroids.  Continue to monitor  Hypophosphatemia: Continue supplementation and monitoring.Given IV PO4 today  History of sleep apnea: Not on CPAP  Concern for parotid swelling: CT maxillofacial done today did not show any parotid inflammation or mass but just generalized enlargement.  No further workup needed  Nutrition Problem: Increased nutrient needs Etiology: catabolic illness,cancer and cancer related treatments      DVT prophylaxis:SCD Code Status: partial Family Communication: daughter at bedside Status is: Inpatient  Remains inpatient appropriate because:Inpatient level of care appropriate due to  severity of illness   Dispo: The patient is from: Home              Anticipated d/c is to: Home              Anticipated d/c date is: 1-2 days              Patient currently is not medically stable to d/c.   Patient can be discharged home with hospice in next 1 to 2 days after making sure that the new G-tube is functioning well, and respiratory status remaining stable   Consultants: iR  Procedures:As above  Antimicrobials:  Anti-infectives (From admission, onward)   None      Subjective: Patient seen and examined at the bedside this morning and also in the afternoon.  NG tube has been removed.  She was moaning in pain.  Doesn't specify  where the pain is.  Respiratory status looks stable, on room air maintaining her saturation.  Extensive discussion held with the daughter at bedside regarding her overall condition, prognosis and our plan  Objective: Vitals:   04/26/20 1445 04/26/20 2009 04/27/20 0405 04/27/20 0500  BP:  (!) 158/58 (!) 182/65   Pulse:  74 70   Resp:  16 18   Temp:  97.6 F (36.4 C) (!) 97.5 F (36.4 C)   TempSrc:  Oral Oral   SpO2:  93% 91%   Weight: 54.8 kg   54.3 kg    Intake/Output Summary (Last 24 hours) at 04/27/2020 5726 Last data filed at 04/27/2020 2035 Gross per 24 hour  Intake 1752.63 ml  Output 80 ml  Net 1672.63 ml   Filed Weights   04/26/20 1445 04/27/20 0500  Weight: 54.8 kg 54.3 kg    Examination:  General exam: Cachectic, chronically ill looking, noticed Face: Bilateral parotid enlargement, nontender Respiratory system: Bilateral diminished air entry but no wheezes or crackles  cardiovascular system: S1 & S2 heard, RRR. No JVD, murmurs, rubs, gallops or clicks. Gastrointestinal system: Abdomen is nondistended, soft and nontender. No organomegaly or masses felt. Normal bowel sounds heard.  PEG tube Central nervous system: Alert and oriented. No focal neurological deficits. Extremities: No edema, no clubbing ,no cyanosis Skin: No   ulcers,no icterus ,no pallor   Data Reviewed: I have personally reviewed following labs and imaging studies  CBC: Recent Labs  Lab 04/24/20 0932 04/25/20 0250 04/26/20 0410 04/27/20 0332  WBC 5.2 5.4 13.7* 15.3*  NEUTROABS 3.7  --  12.6* 14.2*  HGB 12.7 12.6 12.5 12.7  HCT 38.6 38.5 38.8 40.3  MCV 90.8 91.0 93.5 95.7  PLT 117* 116* 104* 90*   Basic Metabolic Panel: Recent Labs  Lab 04/24/20 0932 04/25/20 0250 04/26/20 0410 04/26/20 1746 04/27/20 0332  NA 138 138 140  --  142  K 3.5 3.3* 4.0  --  3.9  CL 97* 100 105  --  109  CO2 24 26 26   --  26  GLUCOSE 128* 128* 193*  --  181*  BUN 22 23 23   --  18  CREATININE 0.67 0.57 0.61  --  0.73  CALCIUM 8.9 8.4* 8.4*  --  8.4*  MG  --   --   --  1.9 2.0  PHOS  --   --   --  1.7* 1.5*   GFR: Estimated Creatinine Clearance: 51.8 mL/min (by C-G formula based on SCr of 0.73 mg/dL). Liver Function Tests: Recent Labs  Lab 04/24/20 0932  AST 53*  ALT 58*  ALKPHOS 129*  BILITOT 1.0  PROT 5.9*  ALBUMIN 2.8*   Recent Labs  Lab 04/24/20 0932  LIPASE 16   No results for input(s): AMMONIA in the last 168 hours. Coagulation Profile: Recent Labs  Lab 04/24/20 0932  INR 1.1   Cardiac Enzymes: No results for input(s): CKTOTAL, CKMB, CKMBINDEX, TROPONINI in the last 168 hours. BNP (last 3 results) No results for input(s): PROBNP in the last 8760 hours. HbA1C: No results for input(s): HGBA1C in the last 72 hours. CBG: Recent Labs  Lab 04/26/20 1623 04/26/20 2010 04/27/20 0004 04/27/20 0359 04/27/20 0733  GLUCAP 155* 158* 154* 143* 182*   Lipid Profile: No results for input(s): CHOL, HDL, LDLCALC, TRIG, CHOLHDL, LDLDIRECT in the last 72 hours. Thyroid Function Tests: No results for input(s): TSH, T4TOTAL, FREET4, T3FREE, THYROIDAB in the last 72 hours. Anemia Panel: No results for input(s): VITAMINB12, FOLATE, FERRITIN, TIBC, IRON, RETICCTPCT in the last 72 hours. Sepsis Labs: No results for input(s):  PROCALCITON, LATICACIDVEN in the last 168 hours.  Recent Results (from the past 240 hour(s))  SARS CORONAVIRUS 2 (TAT 6-24 HRS) Nasopharyngeal Nasopharyngeal Swab     Status: None   Collection Time: 04/24/20  1:41 PM   Specimen: Nasopharyngeal Swab  Result Value Ref Range Status   SARS Coronavirus 2 NEGATIVE NEGATIVE Final    Comment: (NOTE) SARS-CoV-2 target nucleic acids are NOT DETECTED.  The SARS-CoV-2 RNA is generally detectable in upper and lower respiratory specimens during the acute phase of infection. Negative results do not preclude SARS-CoV-2 infection, do not rule out co-infections with other pathogens, and should not be used as the sole basis for treatment or other patient management decisions. Negative results must be combined with clinical observations, patient history, and epidemiological information. The expected result is Negative.  Fact Sheet for Patients: SugarRoll.be  Fact Sheet for Healthcare Providers: https://www.woods-mathews.com/  This test is not yet approved or cleared by the [place] FDA and  has been authorized for detection and/or diagnosis of SARS-CoV-2 by FDA under an Emergency Use Authorization (EUA). This EUA will remain  in effect (meaning this test can be used) for the duration of the COVID-19 declaration under Se ction 564(b)(1) of the Act, 21 U.S.C. section 360bbb-3(b)(1), unless the authorization is terminated or revoked sooner.  Performed at [facility], [place] [address], [place], [place] [address]          Radiology Studies: No results found.      Scheduled Meds: . Chlorhexidine Gluconate Cloth  6 each Topical Daily  . feeding supplement (PROSource TF)  45 mL Per Tube BID  . free water  100 mL Per Tube Q4H  . insulin aspart  0-9 Units Subcutaneous Q4H  . methylPREDNISolone (SOLU-MEDROL) injection  40 mg Intravenous Q12H  . pantoprazole (PROTONIX) IV  40 mg Intravenous  Q24H   Continuous Infusions: . dextrose 5 % and 0.9% NaCl 75 mL/hr at 04/27/20 0520  . feeding supplement (OSMOLITE 1.2 CAL)    . potassium PHOSPHATE IVPB (in mmol)       LOS: 3 days    Time spent: 35 mins.More than 50% of that time was spent in counseling and/or coordination of care.      Shelly Coss, MD [facility] P1/20/2022, 8:28 AM

## 2020-04-27 NOTE — Progress Notes (Signed)
Pharmacy Antibiotic Note  Bonnie Peterson is a 75 y.o. female admitted on 04/24/2020 with abdominal pain.  Pharmacy has been consulted for Unasyn dosing for aspiration PNA.   Plan: Unasyn 3 gm IV q6h Pharmacy to sign off  Weight: 54.3 kg (119 lb 11.4 oz)  Temp (24hrs), Avg:97.7 F (36.5 C), Min:97.5 F (36.4 C), Max:98 F (36.7 C)  Recent Labs  Lab 04/24/20 0932 04/25/20 0250 04/26/20 0410 04/27/20 0332  WBC 5.2 5.4 13.7* 15.3*  CREATININE 0.67 0.57 0.61 0.73    Estimated Creatinine Clearance: 51.8 mL/min (by C-G formula based on SCr of 0.73 mg/dL).    Allergies  Allergen Reactions  . Propofol Other (See Comments)    "It makes me feel like I'm on fire."    Thank you for allowing pharmacy to be a part of this patient's care.  Eudelia Bunch, Pharm.D 04/27/2020 11:44 AM

## 2020-04-27 NOTE — Care Management Important Message (Signed)
Important Message  Patient Details IM Letter placed in Patient's room. Name: Bonnie Peterson MRN: [id number] Date of Birth: [birth year]   Medicare Important Message Given:  Yes     [name] 04/27/2020, 11:19 AM

## 2020-04-27 NOTE — Plan of Care (Signed)

## 2020-04-28 LAB — GLUCOSE, CAPILLARY
Glucose-Capillary: 143 mg/dL — ABNORMAL HIGH (ref 70–99)
Glucose-Capillary: 169 mg/dL — ABNORMAL HIGH (ref 70–99)
Glucose-Capillary: 207 mg/dL — ABNORMAL HIGH (ref 70–99)
Glucose-Capillary: 218 mg/dL — ABNORMAL HIGH (ref 70–99)
Glucose-Capillary: 236 mg/dL — ABNORMAL HIGH (ref 70–99)
Glucose-Capillary: 243 mg/dL — ABNORMAL HIGH (ref 70–99)

## 2020-04-28 LAB — BASIC METABOLIC PANEL
Anion gap: 7 (ref 5–15)
BUN: 20 mg/dL (ref 8–23)
CO2: 26 mmol/L (ref 22–32)
Calcium: 8.3 mg/dL — ABNORMAL LOW (ref 8.9–10.3)
Chloride: 109 mmol/L (ref 98–111)
Creatinine, Ser: 0.58 mg/dL (ref 0.44–1.00)
GFR, Estimated: >60 mL/min (ref >60)
Glucose, Bld: 167 mg/dL — ABNORMAL HIGH (ref 70–99)
Potassium: 4.2 mmol/L (ref 3.5–5.1)
Sodium: 142 mmol/L (ref 135–145)

## 2020-04-28 LAB — CBC WITH DIFFERENTIAL/PLATELET
Abs Immature Granulocytes: 0.09 10*3/uL — ABNORMAL HIGH (ref 0.00–0.07)
Basophils Absolute: 0 10*3/uL (ref 0.0–0.1)
Basophils Relative: 0 %
Eosinophils Absolute: 0 10*3/uL (ref 0.0–0.5)
Eosinophils Relative: 0 %
HCT: 37.8 % (ref 36.0–46.0)
Hemoglobin: 12 g/dL (ref 12.0–15.0)
Immature Granulocytes: 1 %
Lymphocytes Relative: 7 %
Lymphs Abs: 1 10*3/uL (ref 0.7–4.0)
MCH: 29.9 pg (ref 26.0–34.0)
MCHC: 31.7 g/dL (ref 30.0–36.0)
MCV: 94.3 fL (ref 80.0–100.0)
Monocytes Absolute: 0.7 10*3/uL (ref 0.1–1.0)
Monocytes Relative: 5 %
Neutro Abs: 13.1 10*3/uL — ABNORMAL HIGH (ref 1.7–7.7)
Neutrophils Relative %: 87 %
Platelets: 84 10*3/uL — ABNORMAL LOW (ref 150–400)
RBC: 4.01 MIL/uL (ref 3.87–5.11)
RDW: 16.8 % — ABNORMAL HIGH (ref 11.5–15.5)
WBC: 14.8 10*3/uL — ABNORMAL HIGH (ref 4.0–10.5)
nRBC: 0 % (ref 0.0–0.2)

## 2020-04-28 MED ORDER — PANTOPRAZOLE SODIUM 40 MG PO PACK
40.0000 mg | PACK | Freq: Every day | ORAL | Status: DC
  Administered 2020-04-30: 40 mg
  Filled 2020-04-28 (×2): qty 20

## 2020-04-28 NOTE — Progress Notes (Signed)
PHARMACIST - PHYSICIAN COMMUNICATION  DR:   Manuella Ghazi  CONCERNING: IV to Oral Route Change Policy  RECOMMENDATION: This patient is receiving pantoprazole by the intravenous route.  Based on criteria approved by the Pharmacy and Therapeutics Committee, the intravenous medication(s) is/are being converted to the equivalent oral dose form(s) to be given per tube.   DESCRIPTION: These criteria include:  The patient is eating (either orally or via tube) and/or has been taking other orally administered medications for a least 24 hours  The patient has no evidence of active gastrointestinal bleeding or impaired GI absorption (gastrectomy, short bowel, patient on TNA or NPO).  If you have questions about this conversion, please contact the [place], [facility] 04/28/2020 12:08 PM

## 2020-04-28 NOTE — Progress Notes (Signed)
PROGRESS NOTE    Bonnie Peterson  JEH:[id number] DOB: 1945-08-31 DOA: 04/24/2020 PCP: Lattie Haw, MD   Brief Narrative:  Patient is a 75 year old female with history of metastatic cholangiocarcinoma status post G-tube placement, COPD, OSA who is currently enrolled on hospice at home presents to the emergency department with complaints of worsening abdominal pain.  She has a G-tube in place and noted change in the color of secretions.  There was drainage from G-tube site for almost a week.  On her last admission, she was advised not to take any solid food but take only liquids on discharge.  She was continuing to take solid food by mouth.  Abdominal x-ray done in the emergency department did not show any evidence of bowel obstruction.  IR consulted for changing the tube.  She underwent successful conversion of G-tube to [place] tube.  Since she was noted to have large fluid and debris seen in the stomach, NG tube was placed.  NG tube has been removed now and she has been started feeding through G-tube.  Found to have possible aspiration pneumonia, started on antibiotics  -She is currently not at her goal rate of tube feeds and needs multiple equipment to include feeding pump set up at home prior to discharge.  Additionally requires PT evaluation as well.  SLP evaluation pending for comfort feeds at home.  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  There was drainage from G-tube site for almost a week.  On her last admission, she was advised not to take any solid  food but take only liquids on discharge.  She was continuing to take solid food by mouth.  Abdominal x-ray done in the emergency department did not show any evidence of bowel obstruction.  IR consulted for changing the tube.  She underwent successful conversion of G-tube to [place] tube.  Since she was noted to have large fluid and debris seen in the stomach, NG tube was placed.   Drain from NG tube is less, so NG tube removed 1/20 -Appears to be tolerating feeds from [place] tube, but not at goal rate of feeding as of yet -Patient will need a feeding pump at home for continuous feeds prior to discharge   Aspiration pneumonia: Chest x-ray done today showed possible right lower lobe pneumonia.  Most likely aspiration.  Continue on Unasyn day 2.  She is not septic, no febrile.  Currently she is maintaining her saturation on room air.  We will continue to recommend not to have solid food by mouth.  Aspiration precautions. We will change antibiotics to oral on discharge. -SLP evaluation pending to see if comfort feeds would be appropriate  Metastatic cholangiocarcinoma: Currently enrolled in hospice.  Was following with oncology in the past.  Hypertension: Continue as needed medications for severe hypertension.  Home medication will be restarted.  COPD: She is not in respiratory distress but auscultation revealed bilateral wheezes on admission day .  Started on Solu-Medrol,will be changed to predisone  Wheezing is much better .  Currently on room air.  Diabetes type 2: Continue sliding scale  insulin.  Monitor blood sugars which are controlled.  Thrombocytopenia: Most likely chronic and associated  with malignancy.  Continue to monitor on CBC in am  Leukocytosis: Could be associated with steroids.  Continue to monitor  History of sleep apnea: Not on CPAP  Concern for parotid swelling: CT maxillofacial done today did not show any parotid inflammation or mass but just generalized enlargement.  No  further workup needed  Nutrition Problem: Increased nutrient needs Etiology: catabolic illness,cancer and cancer related treatments   DVT prophylaxis:SCDs Code Status: partial; no CPR or cardioversion Family Communication:  Discussed with daughter on phone 1/21 Status is: Inpatient  Remains inpatient appropriate because:Inpatient level of care appropriate due to severity of illness   Dispo: The patient is from: Home  Anticipated d/c is to: Home  Anticipated d/c date is: 1-2 days  Patient currently is not medically stable to d/c.   Patient can be discharged home with hospice in next 1 to 2 days after making sure that the new G-tube is functioning well at full goal rate of feeding, and respiratory status remaining stable.  Patient also needs feeding pump delivered at home and PT evaluation pending.   Consultants: IR  Procedures:As above   Nutritional Assessment:  The patient's BMI is: Body mass index is 21.21 kg/m.[name]  Seen by dietician.  I agree with the assessment and plan as outlined below:  Nutrition Status: Nutrition Problem: Increased nutrient needs Etiology: catabolic illness,cancer and cancer related treatments Signs/Symptoms: estimated needs Interventions: Tube feeding  Consultants:   IR  Procedures:   See above  Antimicrobials:  Anti-infectives (From admission, onward)   Start     Dose/Rate Route Frequency Ordered Stop   04/27/20 1230  Ampicillin-Sulbactam (UNASYN) 3 g in sodium chloride 0.9 % 100 mL IVPB        3 g 200 mL/hr over 30 Minutes Intravenous Every 6 hours 04/27/20 1141         Subjective: Patient seen and evaluated today with no new acute complaints or concerns. No acute concerns or events noted overnight.  Objective: Vitals:   04/27/20 1315 04/27/20 1757 04/27/20 2033 04/28/20 0448  BP: (!) 174/73 131/60 (!) 139/55 (!) 135/59  Pulse: 84 79 78 74  Resp: 16  16 18   Temp:   99 F (37.2 C)  98.6 F (37 C)  TempSrc:      SpO2: 97%  93% 92%  Weight:        Intake/Output Summary (Last 24 hours) at 04/28/2020 1100 Last data filed at 04/28/2020 0700 Gross per 24 hour  Intake 1033.49 ml  Output 100 ml  Net 933.49 ml   Filed Weights   04/26/20 1445 04/27/20 0500  Weight: 54.8 kg 54.3 kg    Examination:  General exam: Appears calm and comfortable  Respiratory system: Clear to auscultation. Respiratory effort normal. Cardiovascular system: S1 & S2 heard, RRR.  Gastrointestinal system: Abdomen is soft; GJ tube present, C/D/I Central nervous system: Alert and awake Extremities: No edema Skin: No significant lesions noted Psychiatry: Flat affect.    Data Reviewed: I have personally reviewed following labs and imaging studies  CBC: Recent Labs  Lab 04/24/20 0932 04/25/20 0250 04/26/20 0410 04/27/20 0332 04/28/20 0409  WBC 5.2 5.4 13.7* 15.3* 14.8*  NEUTROABS 3.7  --  12.6* 14.2* 13.1*  HGB 12.7 12.6 12.5 12.7 12.0  HCT 38.6 38.5 38.8 40.3 37.8  MCV 90.8 91.0 93.5 95.7 94.3  PLT 117* 116* 104* 90* 84*   Basic Metabolic  Panel: Recent Labs  Lab 04/24/20 0932 04/25/20 0250 04/26/20 0410 04/26/20 1746 04/27/20 0332 04/27/20 1700 04/28/20 0409  NA 138 138 140  --  142  --  142  K 3.5 3.3* 4.0  --  3.9  --  4.2  CL 97* 100 105  --  109  --  109  CO2 24 26 26   --  26  --  26  GLUCOSE 128* 128* 193*  --  181*  --  167*  BUN 22 23 23   --  18  --  20  CREATININE 0.67 0.57 0.61  --  0.73  --  0.58  CALCIUM 8.9 8.4* 8.4*  --  8.4*  --  8.3*  MG  --   --   --  1.9 2.0 2.0  --   PHOS  --   --   --  1.7* 1.5* 2.5  --    GFR: Estimated Creatinine Clearance: 51.8 mL/min (by C-G formula based on SCr of 0.58 mg/dL). Liver Function Tests: Recent Labs  Lab 04/24/20 0932  AST 53*  ALT 58*  ALKPHOS 129*  BILITOT 1.0  PROT 5.9*  ALBUMIN 2.8*   Recent Labs  Lab 04/24/20 0932  LIPASE 16   No results for input(s): AMMONIA in the last 168 hours. Coagulation  Profile: Recent Labs  Lab 04/24/20 0932  INR 1.1   Cardiac Enzymes: No results for input(s): CKTOTAL, CKMB, CKMBINDEX, TROPONINI in the last 168 hours. BNP (last 3 results) No results for input(s): PROBNP in the last 8760 hours. HbA1C: No results for input(s): HGBA1C in the last 72 hours. CBG: Recent Labs  Lab 04/27/20 1523 04/27/20 1958 04/27/20 2350 04/28/20 0348 04/28/20 0739  GLUCAP 226* 181* 168* 143* 169*   Lipid Profile: No results for input(s): CHOL, HDL, LDLCALC, TRIG, CHOLHDL, LDLDIRECT in the last 72 hours. Thyroid Function Tests: No results for input(s): TSH, T4TOTAL, FREET4, T3FREE, THYROIDAB in the last 72 hours. Anemia Panel: No results for input(s): VITAMINB12, FOLATE, FERRITIN, TIBC, IRON, RETICCTPCT in the last 72 hours. Sepsis Labs: No results for input(s): PROCALCITON, LATICACIDVEN in the last 168 hours.  Recent Results (from the past 240 hour(s))  SARS CORONAVIRUS 2 (TAT 6-24 HRS) Nasopharyngeal Nasopharyngeal Swab     Status: None   Collection Time: 04/24/20  1:41 PM   Specimen: Nasopharyngeal Swab  Result Value Ref Range Status   SARS Coronavirus 2 NEGATIVE NEGATIVE Final    Comment: (NOTE) SARS-CoV-2 target nucleic acids are NOT DETECTED.  The SARS-CoV-2 RNA is generally detectable in upper and lower respiratory specimens during the acute phase of infection. Negative results do not preclude SARS-CoV-2 infection, do not rule out co-infections with other pathogens, and should not be used as the sole basis for treatment or other patient management decisions. Negative results must be combined with clinical observations, patient history, and epidemiological information. The expected result is Negative.  Fact Sheet for Patients: SugarRoll.be  Fact Sheet for Healthcare Providers: https://www.woods-mathews.com/  This test is not yet approved or cleared by the [place] FDA and  has been authorized for  detection and/or diagnosis of SARS-CoV-2 by FDA under an Emergency Use Authorization (EUA). This EUA will remain  in effect (meaning this test can be used) for the duration of the COVID-19 declaration under Se ction 564(b)(1) of the Act, 21 U.S.C. section 360bbb-3(b)(1), unless the authorization is terminated or revoked sooner.  Performed at [facility], [place] [address] [place], [place] [address]  Radiology Studies: DG CHEST PORT 1 VIEW  Result Date: 04/27/2020 CLINICAL DATA:  Shortness of breath this morning. EXAM: PORTABLE CHEST 1 VIEW COMPARISON:  May 28, 2010 and CT of the chest from November of 2021. Plain film of April 25, 2020 the abdomen. FINDINGS: Gastric tube courses below the carina into the upper abdomen and as expected, side port likely below GE junction, near the level of the EG junction. RIGHT-sided Port-A-Cath at the caval to atrial junction extending into upper RIGHT atrium. Heart size stable enlarged. RIGHT lower lobe airspace disease partially obscures the RIGHT hemidiaphragm. Streaky opacities at the LEFT lung base over the LEFT hemidiaphragm. On limited assessment no acute skeletal process. IMPRESSION: 1. RIGHT lower lobe airspace disease suspicious for pneumonia potentially associated with small effusion. 2. Suspected LEFT lower lobe atelectasis versus early infection in this area. 3. Cardiomegaly. 4. Patient's gastrojejunostomy tube is not well seen on the current study. Electronically Signed   By: Zetta Bills M.D.   On: 04/27/2020 11:15   CT MAXILLOFACIAL WO CONTRAST  Result Date: 04/27/2020 CLINICAL DATA:  TMJ pain or limited movement. Bilateral parotid swelling. EXAM: CT MAXILLOFACIAL WITHOUT CONTRAST TECHNIQUE: Multidetector CT imaging of the maxillofacial structures was performed. Multiplanar CT image reconstructions were also generated. COMPARISON:  None. FINDINGS: Osseous: Negative for fracture. No acute skeletal abnormality. TMJ normal  bilaterally Orbits: Bilateral cataract extraction.  No orbital mass or edema. Sinuses: Near complete opacification left sphenoid sinus with bony thickening consistent with chronic obstruction. Mild expansion compatible with mucocele. Remaining sinuses clear. Mastoid clear. Soft tissues: Prominent parotid tissue bilaterally right greater than left. No acute edema or mass. No parotid stone. Limited intracranial: Negative IMPRESSION: Prominent parotid bilaterally right greater than left without edema or mass. Left sphenoid sinus mucocele. Normal TMJ bilaterally Electronically Signed   By: Franchot Gallo M.D.   On: 04/27/2020 12:27        Scheduled Meds: . Chlorhexidine Gluconate Cloth  6 each Topical Daily  . free water  100 mL Per Tube Q4H  . insulin aspart  0-9 Units Subcutaneous Q4H  . lisinopril  40 mg Per Tube Daily  . pantoprazole (PROTONIX) IV  40 mg Intravenous Q24H  . predniSONE  40 mg Per Tube Q breakfast   Continuous Infusions: . ampicillin-sulbactam (UNASYN) IV 3 g (04/28/20 0625)  . feeding supplement (OSMOLITE 1.2 CAL) 1,000 mL (04/28/20 0455)     LOS: 4 days    Time spent: 35 minutes    Alice Vitelli D Manuella Ghazi, DO [facility]  If 7PM-7AM, please contact night-coverage [email or web address] 04/28/2020, 11:00 AM

## 2020-04-28 NOTE — TOC Progression Note (Addendum)
Transition of Care [facility]) - Progression Note    Patient Details  Name: Bonnie Peterson MRN: [id number] Date of Birth: 1945-08-01  Transition of Care [facility]) CM/SW [place], LCSW Phone Number: 04/28/2020, 1:27 PM  Clinical Narrative:    Lonia Chimera staff Anderson Malta actively coordinating patient discharge plan home with Tube feeding. TOC available when needed. TOC staff when continue to follow.      Barriers to Discharge: Continued Medical Work up  Expected Discharge Plan and Services                                                 Social Determinants of Health (SDOH) Interventions    Readmission Risk Interventions No flowsheet data found.

## 2020-04-28 NOTE — Evaluation (Signed)
Clinical/Bedside Swallow Evaluation Patient Details  Name: CHERITA HEBEL MRN: [id number] Date of Birth: 1945-06-13  Today's Date: 04/28/2020 Time: SLP Start Time (ACUTE ONLY): 1056 SLP Stop Time (ACUTE ONLY): 1105 SLP Time Calculation (min) (ACUTE ONLY): 9 min  Past Medical History:  Past Medical History:  Diagnosis Date  . Anxiety   . Benign positional vertigo   . Cancer ([place]) 02/09/15   intrahepatic cholangiocarcinoma  . COPD (chronic obstructive pulmonary disease) ([place])   . Depression   . Diabetes mellitus without complication ([place])   . Early cataracts, bilateral 10/13   Optho, Dr Gershon Crane  . Echocardiogram findings abnormal, without diagnosis 10/10   10/10: mild pulm HTN, EF 60-65%, mild LVH, moderate aortic regurg  . GERD (gastroesophageal reflux disease)    I have "acid reflux"  . Gout   . HLD (hyperlipidemia)   . HTN, goal below 130/80   . Irritable bowel syndrome   . Obesity, Class III, BMI 40-49.9 (morbid obesity) ([place])   . Obstructive sleep apnea    wears cpap  . Osteoarthritis (arthritis due to wear and tear of joints)    also gout  . Restrictive lung disease    Past Surgical History:  Past Surgical History:  Procedure Laterality Date  . ABDOMINAL HYSTERECTOMY    . BIOPSY  02/18/2020   Procedure: BIOPSY;  Surgeon: Thornton Park, MD;  Location: [facility];  Service: Gastroenterology;;  . COLONOSCOPY WITH PROPOFOL N/A 08/12/2019   Procedure: COLONOSCOPY WITH PROPOFOL;  Surgeon: [name], MD;  Location: [facility];  Service: Endoscopy;  Laterality: N/A;  . ESOPHAGOGASTRODUODENOSCOPY N/A 06/15/2015   Procedure: ESOPHAGOGASTRODUODENOSCOPY (EGD);  Surgeon: Gatha Mayer, MD;  Location: [facility];  Service: Endoscopy;  Laterality: N/A;  . ESOPHAGOGASTRODUODENOSCOPY (EGD) WITH PROPOFOL N/A 02/18/2020   Procedure: ESOPHAGOGASTRODUODENOSCOPY (EGD) WITH PROPOFOL;  Surgeon: Thornton Park, MD;  Location: [facility];  Service: Gastroenterology;   Laterality: N/A;  . IR GASTR TUBE CONVERT GASTR-JEJ PER W/FL MOD SED  02/21/2020  . IR GASTR TUBE CONVERT GASTR-JEJ PER W/FL MOD SED  04/24/2020  . IR GASTROSTOMY TUBE MOD SED  02/21/2020  . IR IMAGING GUIDED PORT INSERTION  06/15/2019  . IR INTRO LONG GI TUBE  04/24/2020  . IR REMOVAL TUN ACCESS W/ PORT W/O FL MOD SED  11/11/2016  . LIVER BIOPSY    . OPEN PARTIAL HEPATECTOMY  Left 02/09/15  . PARTIAL HYSTERECTOMY    . ROTATOR CUFF REPAIR       L rotator cuff repair 11/07-[place] - 12/7/200  . TONSILLECTOMY     HPI:  Patient is a 75 year old female with history of metastatic cholangiocarcinoma status post G-tube placement, COPD, OSA who is currently enrolled on hospice at home presents to the emergency department with complaints of worsening abdominal pain.  She has a G-tube in place and noted change in the color of secretions.  There was drainage from G-tube site for almost a week.  On her last admission, she was advised not to take any solid food but take only liquids on discharge.  She was continuing to take solid food by mouth.  Abdominal x-ray done in the emergency department did not show any evidence of bowel obstruction.  IR consulted for changing the tube.  She underwent successful conversion of G-tube to [place] tube.  Since she was noted to have large fluid and debris seen in the stomach, NG tube was placed.  NG tube has been removed now and she has been started feeding through G-tube.  Found to have possible aspiration pneumonia, started on antibiotics.   Assessment / Plan / Recommendation Clinical Impression  Pt tolerated puree solids and thin liquid by straw without overt s/s of aspiration. Pt is G tube dependent for nutrition.  Dentures were not placed for evaluation and pt reports that she does not typically wear dentures with PO intake. Pt reports that she consumes only purees at home and declined regular solid trials.  Belching noted following PO trials suggesting possible esophageal  dysphagia/dysmotility. MBSS would be needed to rule out silent aspiration; however, pt appears appropriate to resume comfort feedings at this time given her hospice status.   Recommend puree diet with thin liquid.  SLP Visit Diagnosis: Dysphagia, unspecified (R13.10)    Aspiration Risk  Mild aspiration risk    Diet Recommendation Dysphagia 1 (Puree);Thin liquid   Liquid Administration via: Straw;Cup Medication Administration:  (As tolerated) Supervision: Staff to assist with self feeding Compensations: Slow rate;Small sips/bites Postural Changes: Seated upright at 90 degrees    Other  Recommendations Oral Care Recommendations: Oral care BID   Follow up Recommendations None      Frequency and Duration   N/A         Prognosis Prognosis for Safe Diet Advancement: N/A     Swallow Study   General Date of Onset: 04/28/20 HPI: Patient is a 75 year old female with history of metastatic cholangiocarcinoma status post G-tube placement, COPD, OSA who is currently enrolled on hospice at home presents to the emergency department with complaints of worsening abdominal pain.  She has a G-tube in place and noted change in the color of secretions.  There was drainage from G-tube site for almost a week.  On her last admission, she was advised not to take any solid food but take only liquids on discharge.  She was continuing to take solid food by mouth.  Abdominal x-ray done in the emergency department did not show any evidence of bowel obstruction.  IR consulted for changing the tube.  She underwent successful conversion of G-tube to [place] tube.  Since she was noted to have large fluid and debris seen in the stomach, NG tube was placed.  NG tube has been removed now and she has been started feeding through G-tube.  Found to have possible aspiration pneumonia, started on antibiotics. Type of Study: Bedside Swallow Evaluation Previous Swallow Assessment: None Diet Prior to this Study: NPO Temperature  Spikes Noted: No Respiratory Status: Room air History of Recent Intubation: No Behavior/Cognition: Alert;Cooperative;Pleasant mood Oral Cavity Assessment: Within Functional Limits Oral Care Completed by SLP: No Oral Cavity - Dentition: Edentulous;Dentures, not available Vision: Functional for self-feeding Self-Feeding Abilities: Total assist (hand tremors) Patient Positioning: Upright in bed Baseline Vocal Quality: Normal Volitional Swallow: Able to elicit    Oral/Motor/Sensory Function Overall Oral Motor/Sensory Function: Within functional limits Facial ROM: Within Functional Limits Facial Symmetry: Within Functional Limits Lingual ROM: Within Functional Limits Lingual Symmetry: Within Functional Limits Lingual Strength: Within Functional Limits Velum: Within Functional Limits Mandible: Within Functional Limits   Ice Chips Ice chips: Not tested   Thin Liquid Thin Liquid: Within functional limits Presentation: Straw    Nectar Thick Nectar Thick Liquid: Not tested   Honey Thick Honey Thick Liquid: Not tested   Puree Puree: Within functional limits Presentation: Spoon   Solid     Solid: Not tested      Celedonio Savage, [place], [place] Office: [phone]  04/28/2020,11:18 AM

## 2020-04-28 NOTE — Progress Notes (Signed)
[name] [address] [facility]) Hospitalized Hospice Patient Note  Bonnie Peterson is an [facility] hospice patient admitted to hospice services on 01/13/20 with a terminal diagnosis of intrahepatic cholangiocarcinoma. Patient had been experiencing increased drainage and pain from her  g-tube. Patient was transported to [facility] for evaluation and was admitted. [facility] was made aware before transport. Per Dr. Cherie Ouch with [facility] this is a related and covered admission.  Discussed with hospital team, plan will be to d/c home once medically optimized and all DME is set up. [name] requested that physical therapy be consulted to see if she is able to ambulate, as she was previously, or if she will need a wheelchair or hospital bed. Visited patient at the bedside. She is alert and oriented, does not maintain eye contact. She appears to be in pain, but she reports that "it is not that bad and I can manage it". Discussed importance of staying on top of her pain, she voiced understanding. She requested to be left alone. She appears to be despondent and this is not baseline for Bonnie Peterson. She is typically gregarious and interactive. Provided space as requested.  Discussed with her daughter [name]. [name] is concerned that her mother is not rebounding to her baseline. Reiterated that a decline in physical and emotional well being can be anticipated with each "bump in the road". [name] voices understanding. [name] is truly the center of her large family.  V/S: 98.6 oral, 135/55, HR 74, RR 18, SPO2 92% on RA IVs/PRNs: Unasyn 3g IV q6h for aspiration PNA, dilaudid 0.5 mg for severe pain IV x 2 in the last 12 hours of this note Labs: WBC 14.8, plt 84 Nutrition: now NPO, Osmolite tube feeds currently @ 40 ml/hr advancing to goal of 76m/hr--currently tolerating well without nausea or abdominal distention  Problem List: - g- tube dysfuntion - replaced with JG tube, tube feeds have been started and are being  tolerated - aspiration PNA - unasyn IV, patient is on room air and tolerating well, incentive spirometer at the bedside. Order maintained for NPO. This will be an ongoing discussion for Bonnie Peterson her family related to comfort feeds (which they may choose to do), which will inherently set her up for future aspiration PNAs. The plan per her family is to not return to the hospital once she leaves. [facility] [place] [facility] [place]. - intrahepatic cholangiocarcinoma - currently under hospice care  - thrombocytopenia - no active s/s of bleeding, likely related to her malignancy  GOC: Ongoing. Family is clear that they want her to be comfortable. They do not appear to be able to make this shift completely at this time. They are hopeful for more quality time with her at home. D/C planning: return home with hospice support. Tube feeding pump and enteral nutrition ordered. Dtr RShirlean Mylarwill need to be taught at the bedside in preparation for her to take her mother home. IDT: hospice team updated Family: none present in room. Spoke with daughter [name] provided support and answered questionst.  Once Ms. MBoggessis ready to d/c home, please use [facility] non-emergency transport as they contract this service for our active hospice patients.  JVenia CarbonRN, BSN, CEssex HospitalLiaison

## 2020-04-28 NOTE — Progress Notes (Addendum)
Nutrition Follow-up  INTERVENTION:   -Continue Osmolite 1.2 @ 40 ml/hr to advance by 10 ml every 8 hours to reach goal rate of 60 ml/hr via G-tube. -At goal rate, this regimen will provide 1728 kcal, 80 grams protein, and 1181 ml free water.  -100 ml free water every 4 hours (600 ml/day).  -Will monitor for diet advancement  NUTRITION DIAGNOSIS:   Increased nutrient needs related to catabolic illness,cancer and cancer related treatments as evidenced by estimated needs.  Ongoing  GOAL:   Patient will meet greater than or equal to 90% of their needs  Progressing with TF  MONITOR:   Diet advancement,TF tolerance,Labs,Weight trends,I & O's, GOC  REASON FOR ASSESSMENT:   Consult Enteral/tube feeding initiation and management  ASSESSMENT:   75 year old female with medical history of metastatic cholangiocarcinoma status post G-tube placement, COPD, and OSA. She is currently enrolled on hospice at home. She presented to the ED with complaints of worsening abdominal pain. During previous admission she was encouraged to consume liquids only, no solid foods. Abdominal x-ray in the ED did not show any evidence of bowel obstruction. She underwent successful conversion of G-tube to G-J tube. NGT place for LIS d/t large volume of fluid noted in the stomach.  1/17: admitted, s/p G-tube converted to G-J tube 1/20: NGT removed, feeds now via G-tube  Noted in chart review, pt is followed by hospice d/t terminal diagnosis of intrahepatic cholangiocarcinoma.  Pt's TF started yesterday (1/20) despite orders being placed 1/19 d/t kangaroo pump not available overnight. Now that NGT is removed, can now feed via G port of G-J tube. Currently Osmolite 1.2 infusing at 40 ml/hr, continue to advance towards goal. Will d/c Prosource.  Continuous feeds recommended given aspiration risk.  SLP to evaluate, will monitor for recommendations.  Admission weight: 120 lbs. Last weight 1/20: 119 lbs.  I/Os:  +4.3L since admit UOP: 100 ml x 24 hrs  Medications reviewed.  Labs reviewed: CBGs: 143-169   Diet Order:   Diet Order            Diet NPO time specified  Diet effective now                 EDUCATION NEEDS:   Education needs have been addressed  Skin:  Skin Assessment: Reviewed RN Assessment  Last BM:  1/15  Height:   Ht Readings from Last 1 Encounters:  02/25/20 5' 3"  (1.6 m)    Weight:   Wt Readings from Last 1 Encounters:  04/27/20 54.3 kg    BMI:  Body mass index is 21.21 kg/m.  Estimated Nutritional Needs:   Kcal:  1700-1900 kcal  Protein:  80-90 grams  Fluid:  >/= 2 L/day  Clayton Bibles, MS, RD, LDN Inpatient Clinical Dietitian Contact information available via Amion

## 2020-04-29 DIAGNOSIS — I10 Essential (primary) hypertension: Secondary | ICD-10-CM

## 2020-04-29 DIAGNOSIS — Z515 Encounter for palliative care: Secondary | ICD-10-CM

## 2020-04-29 DIAGNOSIS — C221 Intrahepatic bile duct carcinoma: Secondary | ICD-10-CM

## 2020-04-29 DIAGNOSIS — K9423 Gastrostomy malfunction: Principal | ICD-10-CM

## 2020-04-29 DIAGNOSIS — C787 Secondary malignant neoplasm of liver and intrahepatic bile duct: Secondary | ICD-10-CM

## 2020-04-29 DIAGNOSIS — J449 Chronic obstructive pulmonary disease, unspecified: Secondary | ICD-10-CM

## 2020-04-29 LAB — BASIC METABOLIC PANEL
Anion gap: 5 (ref 5–15)
BUN: 22 mg/dL (ref 8–23)
CO2: 26 mmol/L (ref 22–32)
Calcium: 8 mg/dL — ABNORMAL LOW (ref 8.9–10.3)
Chloride: 110 mmol/L (ref 98–111)
Creatinine, Ser: 0.51 mg/dL (ref 0.44–1.00)
GFR, Estimated: >60 mL/min (ref >60)
Glucose, Bld: 238 mg/dL — ABNORMAL HIGH (ref 70–99)
Potassium: 3.9 mmol/L (ref 3.5–5.1)
Sodium: 141 mmol/L (ref 135–145)

## 2020-04-29 LAB — CBC
HCT: 38.7 % (ref 36.0–46.0)
Hemoglobin: 12.3 g/dL (ref 12.0–15.0)
MCH: 30 pg (ref 26.0–34.0)
MCHC: 31.8 g/dL (ref 30.0–36.0)
MCV: 94.4 fL (ref 80.0–100.0)
Platelets: 93 10*3/uL — ABNORMAL LOW (ref 150–400)
RBC: 4.1 MIL/uL (ref 3.87–5.11)
RDW: 16.9 % — ABNORMAL HIGH (ref 11.5–15.5)
WBC: 13.7 10*3/uL — ABNORMAL HIGH (ref 4.0–10.5)
nRBC: 0 % (ref 0.0–0.2)

## 2020-04-29 LAB — GLUCOSE, CAPILLARY
Glucose-Capillary: 214 mg/dL — ABNORMAL HIGH (ref 70–99)
Glucose-Capillary: 179 mg/dL — ABNORMAL HIGH (ref 70–99)
Glucose-Capillary: 208 mg/dL — ABNORMAL HIGH (ref 70–99)
Glucose-Capillary: 234 mg/dL — ABNORMAL HIGH (ref 70–99)
Glucose-Capillary: 205 mg/dL — ABNORMAL HIGH (ref 70–99)

## 2020-04-29 LAB — MAGNESIUM: Magnesium: 1.9 mg/dL (ref 1.7–2.4)

## 2020-04-29 MED ORDER — ALUM & MAG HYDROXIDE-SIMETH 200-200-20 MG/5ML PO SUSP
30.0000 mL | Freq: Four times a day (QID) | ORAL | Status: DC | PRN
  Administered 2020-04-29 – 2020-04-30 (×2): 30 mL via ORAL
  Filled 2020-04-29 (×2): qty 30

## 2020-04-29 NOTE — Progress Notes (Signed)
PROGRESS NOTE    Bonnie Peterson  KWI:[id number] DOB: 11-May-1945 DOA: 04/24/2020 PCP: Lattie Haw, MD   Brief Narrative: Bonnie Peterson is a 75 y.o. female with a history of metastatic cholangiocarcinoma status post G-tube placement, COPD, OSA. She is a hospice patient. She presented secondary to abdominal pain with evidence of a malfunctioning G-tube. IR consulted for replacement and conversion to G-J tube. Evidence of aspiration pneumonia on chest x-ray and patient started on IV antibiotics. Plan for discharge home with hospice and continuous tube feeds.   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 G-tube was converted to G-J tube by IR. Patient now receiving tube feeds via G-J tube and is currently at goal of 60 ml/hr. Patient follows with hospice at home -Continue tube feeds -Dietitian recommendations: -Continue Osmolite 1.2 @ 40 ml/hr to advance by 10 ml every 8 hours to reach goal rate of 60 ml/hr via G-tube. -At goal rate, this regimen will provide 1728 kcal, 80 grams protein, and 1181 ml free water.  -100 ml free water every 4 hours (600 ml/day). -Will monitor for diet advancement   Aspiration pneumonia Chest x-ray significant for possible RLL pneumonia and with history, concerning for aspiration pneumonia. Started on Unasyn IV -Continue Unasyn IV  Metastatic cholangiocarcinoma Currently enrolled with hospice.  Primary hypertension -Continue lisinopril  COPD -Continue albuterol prn  Thrombocytopenia Stable.  Leukocytosis In setting of aspiration.  Trending down.  Sleep apnea Not on CPAP  Concern for parotid swelling Negative CT.   DVT prophylaxis: SCDs Code Status:   Code Status: Partial Code Family Communication: None at bedside Disposition Plan: Discharge home likely tomorrow pending family ability to receive teaching on management of feeding tube   Consultants:   Interventional radiology  Procedures:   CONVERSION OF G TUBE TO G-J TUBE (04/24/2020)  Antimicrobials:  Unasyn    Subjective: No issues today. No abdominal pain. No dyspnea. Some cough.  Objective: Vitals:   04/28/20 1449 04/28/20 2038 04/29/20 0639 04/29/20 1327  BP: (!) 131/59 (!) 131/59 138/66 139/64  Pulse: 83 86 91 83  Resp: 16 16 16 18   Temp:  97.9 F (36.6 C) 97.9 F (36.6 C) 98 F (36.7 C)  TempSrc:  Oral  Oral  SpO2: 94% 95% 98% 98%  Weight:        Intake/Output Summary (Last 24 hours) at 04/29/2020 1808 Last data filed at 04/29/2020 1400 Gross per 24 hour  Intake 2661.17 ml  Output 300 ml  Net 2361.17 ml   Filed Weights   04/26/20 1445 04/27/20 0500  Weight: 54.8 kg 54.3 kg    Examination:  General exam: Appears calm and comfortable Respiratory system: Diminished. Respiratory effort normal. Cardiovascular system: S1 & S2 heard, RRR. No murmurs, rubs, gallops or clicks. Gastrointestinal system: Abdomen is nondistended, soft and nontender. No organomegaly or masses felt. Normal bowel sounds heard. Central nervous system: Alert and oriented. No focal neurological deficits. Musculoskeletal: No edema. No calf tenderness Skin: No cyanosis. No rashes Psychiatry: Judgement and insight appear normal. Mood & affect appropriate.     Data Reviewed: I have personally reviewed following labs and  imaging studies  CBC Lab Results  Component Value Date   WBC 13.7 (H) 04/29/2020   RBC 4.10 04/29/2020   HGB 12.3 04/29/2020   HCT 38.7 04/29/2020   MCV 94.4 04/29/2020   MCH 30.0 04/29/2020   PLT 93 (L) 04/29/2020   MCHC 31.8  04/29/2020   RDW 16.9 (H) 04/29/2020   LYMPHSABS 1.0 04/28/2020   MONOABS 0.7 04/28/2020   EOSABS 0.0 04/28/2020   BASOSABS 0.0 40/98/1191     Last metabolic panel Lab Results  Component Value Date   NA 141 04/29/2020   K 3.9 04/29/2020   CL 110 04/29/2020   CO2 26 04/29/2020   BUN 22 04/29/2020   CREATININE 0.51 04/29/2020   GLUCOSE 238 (H) 04/29/2020   GFRNONAA >60 04/29/2020   GFRAA 102 02/25/2020   CALCIUM 8.0 (L) 04/29/2020   PHOS 2.5 04/27/2020   PROT 5.9 (L) 04/24/2020   ALBUMIN 2.8 (L) 04/24/2020   LABGLOB 3.1 07/22/2017   AGRATIO 1.2 07/22/2017   BILITOT 1.0 04/24/2020   ALKPHOS 129 (H) 04/24/2020   AST 53 (H) 04/24/2020   ALT 58 (H) 04/24/2020   ANIONGAP 5 04/29/2020    CBG (last 3)  Recent Labs    04/29/20 0746 04/29/20 1100 04/29/20 1540  GLUCAP 179* 208* 234*     GFR: Estimated Creatinine Clearance: 51.8 mL/min (by C-G formula based on SCr of 0.51 mg/dL).  Coagulation Profile: Recent Labs  Lab 04/24/20 0932  INR 1.1    Recent Results (from the past 240 hour(s))  SARS CORONAVIRUS 2 (TAT 6-24 HRS) Nasopharyngeal Nasopharyngeal Swab     Status: None   Collection Time: 04/24/20  1:41 PM   Specimen: Nasopharyngeal Swab  Result Value Ref Range Status   SARS Coronavirus 2 NEGATIVE NEGATIVE Final    Comment: (NOTE) SARS-CoV-2 target nucleic acids are NOT DETECTED.  The SARS-CoV-2 RNA is generally detectable in upper and lower respiratory specimens during the acute phase of infection. Negative results do not preclude SARS-CoV-2 infection, do not rule out co-infections with other pathogens, and should not be used as the sole basis for treatment or other patient management decisions. Negative results must be combined with clinical observations, patient history, and epidemiological information. The expected result is Negative.  Fact Sheet for Patients: SugarRoll.be  Fact Sheet for Healthcare  Providers: https://www.woods-mathews.com/  This test is not yet approved or cleared by the [place] FDA and  has been authorized for detection and/or diagnosis of SARS-CoV-2 by FDA under an Emergency Use Authorization (EUA). This EUA will remain  in effect (meaning this test can be used) for the duration of the COVID-19 declaration under Se ction 564(b)(1) of the Act, 21 U.S.C. section 360bbb-3(b)(1), unless the authorization is terminated or revoked sooner.  Performed at [facility], [place] [address] [place], [place] [address]         Radiology Studies: No results found.      Scheduled Meds: . Chlorhexidine Gluconate Cloth  6 each Topical Daily  . free water  100 mL Per Tube Q4H  . insulin aspart  0-9 Units Subcutaneous Q4H  . lisinopril  40 mg Per Tube Daily  . pantoprazole sodium  40 mg Per Tube Daily  . predniSONE  40 mg Per Tube Q breakfast   Continuous Infusions: . ampicillin-sulbactam (UNASYN) IV 3 g (04/29/20 1211)  . feeding supplement (OSMOLITE 1.2 CAL) 1,000 mL (04/29/20 1526)     LOS: 5 days     Cordelia Poche, MD  [facility] 04/29/2020, 6:08 PM  If 7PM-7AM, please contact night-coverage [email or web address]

## 2020-04-29 NOTE — Discharge Summary (Signed)
Physician Discharge Summary  Bonnie Peterson XAJ:[id number] DOB: December 29, 1945 DOA: 04/24/2020  PCP: Lattie Haw, MD  Admit date: 04/24/2020 Discharge date: 04/29/2020  Admitted From: Home with Hospice Disposition: Home with Hospice  Recommendations for Outpatient Follow-up:  1. Follow up with hospice 2. Please follow up on the following pending results: None  Home Health: Hospice Equipment/Devices: Feeding tube/pump supplies  Discharge Condition: Hospice CODE STATUS: No CPR. Intubation if needed Diet recommendation: Continuous tube feeds @ 60 ml/hr and 100 mL of free water every 4 hours in addition to recommendations below.  Diet Recommendation Dysphagia 1 (Puree);Thin liquid   Liquid Administration via: Straw;Cup Medication Administration:  (As tolerated) Supervision: Staff to assist with self feeding Compensations: Slow rate;Small sips/bites Postural Changes: Seated upright at 90 degrees       Brief/Interim Summary:  Admission HPI written by Annita Brod, MD   Chief Complaint: Abdominal pain   HPI: Bonnie Peterson is a 75 y.o. female with medical history significant for metastatic cholangiocarcinoma status post G-tube as well as COPD and obstructive sleep apnea, currently on hospice who presented to the emergency room on 1/17 with worsening abdominal discomfort.  Patient has a G-tube in place and noted change in color of secretions.  She notes drainage from G-tube site for almost a week.  During her last hospitalization over a month ago, she was told that she should no longer eat solid food and taking only liquids.  She has continued to eat solid food however.  Patient complained of pain at her catheter site and says that her pain has been refractory to home medications.  Abdominal x-ray noted no evidence of bowel obstruction.  Interventional radiology was consulted for changing of tube.  Patient underwent successful fluoroscopic guided conversion of G-tube into GJ  tube.  She was noted to have such a large amount of retained debris within her stomach, an NG tube was placed as well.  Discussed with interventional radiology who recommended keeping NG tube in for several days while entire contents of stomach debris were removed.  GJ tube accessible and ready for use now.   Hospital course:  G-tube dysfunction Patient presented with previously placed G-tube requiring replacement. G-tube was converted to G-J tube by IR. Patient now receiving tube feeds via G-J tube and is currently at goal of 60 ml/hr. Patient follows with hospice at home. Discharge with tube feeds. Recommend 100 ml of free water 6 times per day  Aspiration pneumonia Chest x-ray significant for possible RLL pneumonia and with history, concerning for aspiration pneumonia. Started on Unasyn IV and transitioned to Augmentin.  Metastatic cholangiocarcinoma Currently enrolled with hospice.  Primary hypertension Continue lisinopril  COPD Continue albuterol prn  Thrombocytopenia Stable.  Leukocytosis In setting of aspiration. Trending down.  Sleep apnea Not on CPAP  Concern for parotid swelling Negative CT.  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 ([place])   Type 2 diabetes mellitus without complication, without long-term current use of insulin ([name])    Discharge Instructions   Allergies as of 04/30/2020      Reactions   Propofol Other (See Comments)   "It makes me feel like I'm on fire."      Medication List    STOP taking these medications   alendronate 70 MG tablet Commonly known as: FOSAMAX  cholecalciferol 25 MCG (1000 UNIT) tablet Commonly known as: VITAMIN D   cloNIDine 0.3 mg/24hr patch Commonly known as: CATAPRES - Dosed in mg/24 hr   haloperidol  0.5 MG tablet Commonly known as: HALDOL   lidocaine-prilocaine cream Commonly known as: EMLA   mirtazapine 15 MG disintegrating tablet Commonly known as: REMERON SOL-TAB   ondansetron 8 MG disintegrating tablet Commonly known as: Zofran ODT   pantoprazole 40 MG tablet Commonly known as: Protonix Replaced by: pantoprazole sodium 40 mg/20 mL Pack   Suprep Bowel Prep Kit 17.5-3.13-1.6 GM/177ML Soln Generic drug: Na Sulfate-K Sulfate-Mg Sulf   traMADol 50 MG tablet Commonly known as: ULTRAM     TAKE these medications   albuterol 108 (90 Base) MCG/ACT inhaler Commonly known as: ProAir HFA Inhale 1-2 puffs into the lungs every 6 (six) hours as needed for wheezing or shortness of breath.   amoxicillin-clavulanate 250-62.5 MG/5ML suspension Commonly known as: AUGMENTIN Place 10 mLs (500 mg total) into feeding tube every 8 (eight) hours for 4 days.   dexamethasone 4 MG tablet Commonly known as: DECADRON Take 1 tablet (4 mg total) by mouth 2 (two) times daily with a meal.   dicyclomine 20 MG tablet Commonly known as: BENTYL Take 1 tablet (20 mg total) by mouth 2 (two) times daily as needed for spasms (abdominal pain). What changed: reasons to take this   famotidine 20 MG tablet Commonly known as: PEPCID Take 1 tablet (20 mg total) by mouth 2 (two) times daily. What changed:   when to take this  reasons to take this   feeding supplement (OSMOLITE 1.2 CAL) Liqd Place 60 mL/hr into feeding tube continuous.   fluticasone 50 MCG/ACT nasal spray Commonly known as: FLONASE Place 2 sprays into both nostrils daily as needed for rhinitis.   free water Soln Place 100 mLs into feeding tube every 4 (four) hours.   HYDROmorphone 2 MG tablet Commonly known as: DILAUDID Take 1 tablet (2 mg total) by mouth every 4 (four) hours as needed for severe pain.   lisinopril 40 MG tablet Commonly known as: ZESTRIL Place 1 tablet (40 mg total) into feeding tube daily. What changed: how  to take this   LORazepam 0.5 MG tablet Commonly known as: ATIVAN Take 0.5 mg by mouth every 8 (eight) hours as needed for anxiety.   pantoprazole sodium 40 mg/20 mL Pack Commonly known as: PROTONIX Place 20 mLs (40 mg total) into feeding tube daily. Start taking on: May 01, 2020 Replaces: pantoprazole 40 MG tablet       Allergies  Allergen Reactions  . Propofol Other (See Comments)    "It makes me feel like I'm on fire."    Consultations:  Interventional radiology   Procedures/Studies: DG Abd 1 View  Result Date: 04/25/2020 CLINICAL DATA:  Abdominal distention. EXAM: ABDOMEN - 1 VIEW COMPARISON:  IR images 04/24/2020.  Abdomen 04/24/2020 FINDINGS: Interim placement of NG tube, its tip is over the stomach. Gastrojejunostomy catheter in stable position. Surgical clips right upper quadrant. Oral contrast noted what appears to be colon. No bowel distention. Degenerative change lumbar spine. IMPRESSION: 1. Interim placement of NG tube, its tip is over the stomach. Gastrojejunostomy catheter in stable position. 2. Oral contrast noted in what appears to be colon. No bowel distention. Electronically Signed   By: Lowell   On: 04/25/2020 06:30   IR GASTR TUBE CONVERT GASTR-JEJ PER W/FL MOD SED  Result Date: 04/24/2020 INDICATION: History of metastatic cholangiocarcinoma, currently on hospice. Patient  underwent image guided placement of a pull-through venting gastrostomy tube on 02/21/2020 for palliative purposes. Attempt was made at the time of gastrostomy tube placement to convert the gastrostomy tube to a gastrojejunostomy catheter however this was unsuccessful secondary to subtotal occlusion of the gastric antrum. Patient returns today with complaint of excessive pericatheter leakage and as such presents for fluoroscopic guided gastrostomy tube evaluation and management. EXAM: 1. FLUOROSCOPIC GUIDED CONVERSION OF EXISTING GASTROSTOMY TUBE TO A GASTROJEJUNOSTOMY TUBE 2.  FLUOROSCOPIC GUIDED PLACEMENT OF A NASOGASTRIC TUBE. COMPARISON:  Image guided pull-through gastrostomy tube placement-02/21/2020 MEDICATIONS: None. CONTRAST:  33m OMNIPAQUE IOHEXOL 300 MG/ML SOLN administered into the gastric lumen and proximal small bowel FLUOROSCOPY TIME:  22 minutes, 24 seconds (3240mGy) COMPLICATIONS: None. PROCEDURE: Informed written consent was obtained from the patient after a discussion of the risks and benefits. The upper abdomen and the external portion of the existing gastrostomy tube was prepped and draped in the usual sterile fashion, and a sterile drape was applied covering the operative field. Maximum barrier sterile technique with sterile gowns and gloves were used for the procedure. A timeout was performed prior to the initiation of the procedure. The gastrostomy tube was injected with a small amount of contrast demonstrating significant distension the stomach which is noted to be filled with a large amount of debris. Ultimately with prolonged diligent effort, a stiff glidewire was advanced through the subtotal occlusion of the gastric antrum with the use of a short Kumpe catheter. Under imaged fluoroscopic guidance, the Kumpe catheter was exchanged for a coaxial 9-French gastrojejunostomy catheter which was advanced over the stiff Glidewire with end ultimately co terminating within the proximal jejunum. Contrast injection demonstrated appropriate positioning and functionality of both the gastric and jejunal lumens. Given the significant amount of debris within the stomach as well as patient's presenting complaint of excessive pericatheter leakage, an 18 gauge nasogastric 2 was advanced through the left narrowings to the level of the stomach under intermittent fluoroscopic guidance after the left naris was anesthetized with viscous lidocaine. Postprocedural spot fluoroscopic images were obtained and the procedure was terminated. The nasogastric tube was affixed at the nose with  retention tape. Dressings were applied. The patient tolerated procedure well without immediate postprocedural complication. IMPRESSION: 1. Successful fluoroscopic guided conversion of pull-through gastrostomy tube to a gastrojejunostomy catheter with jejunal limb terminating within the proximal jejunum. 2. Successful fluoroscopic guided placement of an 124French nasogastric tube for the purposes of maximum gastric venting. PLAN: - Given the large amount of retained debris within the stomach, recommended the nasogastric tube stay in place for 1-2 days connected to low wall suction to allow for optimal gastric venting. - The jejunal lumen is ready for immediate use for feeding and the gastric lumen may be utilized for venting purposes as indicated. 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 Electronically Signed   By: JSandi MariscalM.D.   On: 04/24/2020 16:35   IR INTRO LONG GI TUBE  Result Date: 04/24/2020 INDICATION: History of metastatic cholangiocarcinoma, currently on hospice. Patient underwent image guided placement of a pull-through venting gastrostomy tube on 02/21/2020 for palliative purposes. Attempt was made at the time of gastrostomy tube placement to convert the gastrostomy tube to a gastrojejunostomy catheter however this was unsuccessful secondary to subtotal occlusion of the gastric antrum. Patient returns today with complaint of excessive pericatheter leakage and as such presents for  fluoroscopic guided gastrostomy tube evaluation and management. EXAM: 1. FLUOROSCOPIC GUIDED CONVERSION OF EXISTING GASTROSTOMY TUBE TO A GASTROJEJUNOSTOMY TUBE 2. FLUOROSCOPIC GUIDED PLACEMENT OF A NASOGASTRIC TUBE. COMPARISON:  Image guided pull-through gastrostomy tube placement-02/21/2020 MEDICATIONS: None. CONTRAST:  44m OMNIPAQUE IOHEXOL 300 MG/ML SOLN administered into the gastric lumen and  proximal small bowel FLUOROSCOPY TIME:  22 minutes, 24 seconds (3517mGy) COMPLICATIONS: None. PROCEDURE: Informed written consent was obtained from the patient after a discussion of the risks and benefits. The upper abdomen and the external portion of the existing gastrostomy tube was prepped and draped in the usual sterile fashion, and a sterile drape was applied covering the operative field. Maximum barrier sterile technique with sterile gowns and gloves were used for the procedure. A timeout was performed prior to the initiation of the procedure. The gastrostomy tube was injected with a small amount of contrast demonstrating significant distension the stomach which is noted to be filled with a large amount of debris. Ultimately with prolonged diligent effort, a stiff glidewire was advanced through the subtotal occlusion of the gastric antrum with the use of a short Kumpe catheter. Under imaged fluoroscopic guidance, the Kumpe catheter was exchanged for a coaxial 9-French gastrojejunostomy catheter which was advanced over the stiff Glidewire with end ultimately co terminating within the proximal jejunum. Contrast injection demonstrated appropriate positioning and functionality of both the gastric and jejunal lumens. Given the significant amount of debris within the stomach as well as patient's presenting complaint of excessive pericatheter leakage, an 18 gauge nasogastric 2 was advanced through the left narrowings to the level of the stomach under intermittent fluoroscopic guidance after the left naris was anesthetized with viscous lidocaine. Postprocedural spot fluoroscopic images were obtained and the procedure was terminated. The nasogastric tube was affixed at the nose with retention tape. Dressings were applied. The patient tolerated procedure well without immediate postprocedural complication. IMPRESSION: 1. Successful fluoroscopic guided conversion of pull-through gastrostomy tube to a gastrojejunostomy  catheter with jejunal limb terminating within the proximal jejunum. 2. Successful fluoroscopic guided placement of an 164French nasogastric tube for the purposes of maximum gastric venting. PLAN: - Given the large amount of retained debris within the stomach, recommended the nasogastric tube stay in place for 1-2 days connected to low wall suction to allow for optimal gastric venting. - The jejunal lumen is ready for immediate use for feeding and the gastric lumen may be utilized for venting purposes as indicated. 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 Electronically Signed   By: JSandi MariscalM.D.   On: 04/24/2020 16:35   DG CHEST PORT 1 VIEW  Result Date: 04/27/2020 CLINICAL DATA:  Shortness of breath this morning. EXAM: PORTABLE CHEST 1 VIEW COMPARISON:  May 28, 2010 and CT of the chest from November of 2021. Plain film of April 25, 2020 the abdomen. FINDINGS: Gastric tube courses below the carina into the upper abdomen and as expected, side port likely below GE junction, near the level of the EG junction. RIGHT-sided Port-A-Cath at the caval to atrial junction extending into upper RIGHT atrium. Heart size stable enlarged. RIGHT lower lobe airspace disease partially obscures the RIGHT hemidiaphragm. Streaky opacities at the LEFT lung base over the LEFT hemidiaphragm. On limited assessment no acute skeletal process. IMPRESSION: 1. RIGHT lower lobe airspace disease suspicious for pneumonia potentially associated with small effusion. 2. Suspected LEFT lower lobe atelectasis versus  early infection in this area. 3. Cardiomegaly. 4. Patient's gastrojejunostomy tube is not well seen on the current study. Electronically Signed   By: Zetta Bills M.D.   On: 04/27/2020 11:15   DG ABD ACUTE 2+V W 1V CHEST  Result Date: 04/24/2020 CLINICAL DATA:  Abdominal pain EXAM: DG ABDOMEN ACUTE  WITH 1 VIEW CHEST COMPARISON:  Chest CT February 17, 2020; abdominal radiograph February 15, 2020 FINDINGS: AP chest: Port-A-Cath tip is at the cavoatrial junction. No pneumothorax. There is mild atelectasis in the right base. Lungs otherwise are clear. Heart size and pulmonary vascularity are normal. No adenopathy. There is aortic atherosclerosis. Supine and left lateral decubitus abdomen: Gastrostomy catheter in region of stomach. No bowel dilatation or air-fluid level to suggest bowel obstruction. No free air. There are surgical clips in right upper quadrant. IMPRESSION: Gastrostomy catheter in region of stomach. No bowel dilatation or air-fluid level to suggest bowel obstruction. No free air. Surgical clips right upper quadrant. Slight right base atelectasis. No edema or airspace opacity. Port-A-Cath tip in superior vena cava. Aortic Atherosclerosis (ICD10-I70.0). Electronically Signed   By: Lowella Grip III M.D.   On: 04/24/2020 10:15   CT MAXILLOFACIAL WO CONTRAST  Result Date: 04/27/2020 CLINICAL DATA:  TMJ pain or limited movement. Bilateral parotid swelling. EXAM: CT MAXILLOFACIAL WITHOUT CONTRAST TECHNIQUE: Multidetector CT imaging of the maxillofacial structures was performed. Multiplanar CT image reconstructions were also generated. COMPARISON:  None. FINDINGS: Osseous: Negative for fracture. No acute skeletal abnormality. TMJ normal bilaterally Orbits: Bilateral cataract extraction.  No orbital mass or edema. Sinuses: Near complete opacification left sphenoid sinus with bony thickening consistent with chronic obstruction. Mild expansion compatible with mucocele. Remaining sinuses clear. Mastoid clear. Soft tissues: Prominent parotid tissue bilaterally right greater than left. No acute edema or mass. No parotid stone. Limited intracranial: Negative IMPRESSION: Prominent parotid bilaterally right greater than left without edema or mass. Left sphenoid sinus mucocele. Normal TMJ bilaterally  Electronically Signed   By: Franchot Gallo M.D.   On: 04/27/2020 12:27      Subjective: No issues overnight  Discharge Exam: Vitals:   04/28/20 2038 04/29/20 0639  BP: (!) 131/59 138/66  Pulse: 86 91  Resp: 16 16  Temp: 97.9 F (36.6 C) 97.9 F (36.6 C)  SpO2: 95% 98%   Vitals:   04/28/20 0448 04/28/20 1449 04/28/20 2038 04/29/20 0639  BP: (!) 135/59 (!) 131/59 (!) 131/59 138/66  Pulse: 74 83 86 91  Resp: 18 16 16 16   Temp: 98.6 F (37 C)  97.9 F (36.6 C) 97.9 F (36.6 C)  TempSrc:   Oral   SpO2: 92% 94% 95% 98%  Weight:        General exam: Appears calm and comfortable Respiratory system: Clear to auscultation. Respiratory effort normal. Cardiovascular system: S1 & S2 heard, RRR. No murmurs, rubs, gallops or clicks. Gastrointestinal system: Abdomen is slightly distended, soft and nontender. No organomegaly or masses felt. Normal bowel sounds heard. Central nervous system: Alert and oriented. No focal neurological deficits. Musculoskeletal: No edema. No calf tenderness Skin: No cyanosis. No rashes Psychiatry: Judgement and insight appear normal. Mood & affect appropriate.  The results of significant diagnostics from this hospitalization (including imaging, microbiology, ancillary and laboratory) are listed below for reference.     Microbiology: Recent Results (from the past 240 hour(s))  SARS CORONAVIRUS 2 (TAT 6-24 HRS) Nasopharyngeal Nasopharyngeal Swab     Status: None   Collection Time: 04/24/20  1:41 PM   Specimen: Nasopharyngeal Swab  Result Value Ref Range Status   SARS Coronavirus 2 NEGATIVE NEGATIVE Final    Comment: (NOTE) SARS-CoV-2 target nucleic acids are NOT DETECTED.  The SARS-CoV-2 RNA is generally detectable in upper and lower respiratory specimens during the acute phase of infection. Negative results do not preclude SARS-CoV-2 infection, do not rule out co-infections with other pathogens, and should not be used as the sole basis for  treatment or other patient management decisions. Negative results must be combined with clinical observations, patient history, and epidemiological information. The expected result is Negative.  Fact Sheet for Patients: SugarRoll.be  Fact Sheet for Healthcare Providers: https://www.woods-mathews.com/  This test is not yet approved or cleared by the [place] FDA and  has been authorized for detection and/or diagnosis of SARS-CoV-2 by FDA under an Emergency Use Authorization (EUA). This EUA will remain  in effect (meaning this test can be used) for the duration of the COVID-19 declaration under Se ction 564(b)(1) of the Act, 21 U.S.C. section 360bbb-3(b)(1), unless the authorization is terminated or revoked sooner.  Performed at [facility], [place] [address] [place], [place] [address]      Labs: BNP (last 3 results) No results for input(s): BNP in the last 8760 hours. Basic Metabolic Panel: Recent Labs  Lab 04/25/20 0250 04/26/20 0410 04/26/20 1746 04/27/20 0332 04/27/20 1700 04/28/20 0409 04/29/20 0400  NA 138 140  --  142  --  142 141  K 3.3* 4.0  --  3.9  --  4.2 3.9  CL 100 105  --  109  --  109 110  CO2 26 26  --  26  --  26 26  GLUCOSE 128* 193*  --  181*  --  167* 238*  BUN 23 23  --  18  --  20 22  CREATININE 0.57 0.61  --  0.73  --  0.58 0.51  CALCIUM 8.4* 8.4*  --  8.4*  --  8.3* 8.0*  MG  --   --  1.9 2.0 2.0  --  1.9  PHOS  --   --  1.7* 1.5* 2.5  --   --    Liver Function Tests: Recent Labs  Lab 04/24/20 0932  AST 53*  ALT 58*  ALKPHOS 129*  BILITOT 1.0  PROT 5.9*  ALBUMIN 2.8*   Recent Labs  Lab 04/24/20 0932  LIPASE 16   No results for input(s): AMMONIA in the last 168 hours. CBC: Recent Labs  Lab 04/24/20 0932 04/25/20 0250 04/26/20 0410 04/27/20 0332 04/28/20 0409 04/29/20 0400  WBC 5.2 5.4 13.7* 15.3* 14.8* 13.7*  NEUTROABS 3.7  --  12.6* 14.2* 13.1*  --   HGB 12.7 12.6 12.5  12.7 12.0 12.3  HCT 38.6 38.5 38.8 40.3 37.8 38.7  MCV 90.8 91.0 93.5 95.7 94.3 94.4  PLT 117* 116* 104* 90* 84* 93*   Cardiac Enzymes: No results for input(s): CKTOTAL, CKMB, CKMBINDEX, TROPONINI in the last 168 hours. BNP: Invalid input(s): POCBNP CBG: Recent Labs  Lab 04/28/20 1514 04/28/20 1934 04/28/20 2352 04/29/20 0346 04/29/20 0746  GLUCAP 243* 207* 236* 214* 179*   D-Dimer No results for input(s): DDIMER in the last 72 hours. Hgb A1c No results for input(s): HGBA1C in the last 72 hours. Lipid Profile No results for input(s): CHOL, HDL, LDLCALC, TRIG, CHOLHDL, LDLDIRECT in the last 72 hours. Thyroid function studies No results for input(s): TSH, T4TOTAL, T3FREE, THYROIDAB in the last 72 hours.  Invalid input(s): FREET3 Anemia work up  No results for input(s): VITAMINB12, FOLATE, FERRITIN, TIBC, IRON, RETICCTPCT in the last 72 hours. Urinalysis    Component Value Date/Time   COLORURINE AMBER (A) 02/14/2020 1112   APPEARANCEUR HAZY (A) 02/14/2020 1112   LABSPEC 1.027 02/14/2020 1112   PHURINE 5.0 02/14/2020 1112   GLUCOSEU NEGATIVE 02/14/2020 1112   HGBUR NEGATIVE 02/14/2020 1112   BILIRUBINUR NEGATIVE 02/14/2020 1112   BILIRUBINUR negative 01/04/2013 1530   KETONESUR 5 (A) 02/14/2020 1112   PROTEINUR 100 (A) 02/14/2020 1112   UROBILINOGEN 0.2 10/09/2013 2037   NITRITE NEGATIVE 02/14/2020 1112   LEUKOCYTESUR NEGATIVE 02/14/2020 1112   Sepsis Labs Invalid input(s): PROCALCITONIN,  WBC,  LACTICIDVEN Microbiology Recent Results (from the past 240 hour(s))  SARS CORONAVIRUS 2 (TAT 6-24 HRS) Nasopharyngeal Nasopharyngeal Swab     Status: None   Collection Time: 04/24/20  1:41 PM   Specimen: Nasopharyngeal Swab  Result Value Ref Range Status   SARS Coronavirus 2 NEGATIVE NEGATIVE Final    Comment: (NOTE) SARS-CoV-2 target nucleic acids are NOT DETECTED.  The SARS-CoV-2 RNA is generally detectable in upper and lower respiratory specimens during the acute  phase of infection. Negative results do not preclude SARS-CoV-2 infection, do not rule out co-infections with other pathogens, and should not be used as the sole basis for treatment or other patient management decisions. Negative results must be combined with clinical observations, patient history, and epidemiological information. The expected result is Negative.  Fact Sheet for Patients: SugarRoll.be  Fact Sheet for Healthcare Providers: https://www.woods-mathews.com/  This test is not yet approved or cleared by the [place] FDA and  has been authorized for detection and/or diagnosis of SARS-CoV-2 by FDA under an Emergency Use Authorization (EUA). This EUA will remain  in effect (meaning this test can be used) for the duration of the COVID-19 declaration under Se ction 564(b)(1) of the Act, 21 U.S.C. section 360bbb-3(b)(1), unless the authorization is terminated or revoked sooner.  Performed at [facility], [place] [address], [place], [place] [address]      Time coordinating discharge: 35 minutes  SIGNED:   Cordelia Poche, MD [facility] 04/29/2020, 9:28 AM
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
## 2020-04-29 NOTE — Evaluation (Signed)
Physical Therapy Evaluation Patient Details Name: Bonnie Peterson MRN: [id number] DOB: 03-04-1946 Today's Date: 04/29/2020   History of Present Illness  Pt admitted with abdominal pain and "g-tube issue".  Pt with hx of COPD, DM, BPV, and intrahepatic cholangiocarcinoma.  Pt is a home hospice pt.  Clinical Impression  Pt admitted as above and presenting with functional mobility limitations 2* generalized weakness, decreased endurance and mild ambulatory balance deficits.  Pt is looking forward to dc home with dtr.  Pt is a current home hospice pt.    Follow Up Recommendations No PT follow up    Equipment Recommendations  Other (comment) (BSC and RW if does not already have at home)    Recommendations for Other Services       Precautions / Restrictions Precautions Precautions: Fall Restrictions Weight Bearing Restrictions: No      Mobility  Bed Mobility Overal bed mobility: Needs Assistance Bed Mobility: Supine to Sit     Supine to sit: Supervision     General bed mobility comments: Increased time and effort but no physical assist required    Transfers Overall transfer level: Needs assistance Equipment used: Rolling walker (2 wheeled) Transfers: Sit to/from Stand Sit to Stand: Min guard;Supervision         General transfer comment: supervision for safety with min cues for transition position and use of UEs to self assist  Ambulation/Gait Ambulation/Gait assistance: Min guard;Supervision Gait Distance (Feet): 100 Feet Assistive device: Rolling walker (2 wheeled) Gait Pattern/deviations: Trunk flexed;Step-to pattern;Step-through pattern;Decreased step length - right;Decreased step length - left;Shuffle Gait velocity: decr   General Gait Details: Increased time with min cues for posture and position from RW.  Good stability with use of RW and no LOB with turns  Financial trader [name] (Stroke Patients Only)        Balance Overall balance assessment: Needs assistance Sitting-balance support: No upper extremity supported;Feet supported Sitting balance-Leahy Scale: Good     Standing balance support: No upper extremity supported Standing balance-Leahy Scale: Fair                               Pertinent Vitals/Pain Pain Assessment: 0-10 Pain Score: 3  Pain Location: abdomen Pain Descriptors / Indicators: Sore Pain Intervention(s): Limited activity within patient's tolerance;Monitored during session;Premedicated before session    Home Living Family/patient expects to be discharged to:: Private residence Living Arrangements: Children;Other relatives Available Help at Discharge: Family;Available 24 hours/day Type of Home: House Home Access: Stairs to enter Entrance Stairs-Rails: None Entrance Stairs-Number of Steps: 2 Home Layout: One level Home Equipment: Walker - 2 wheels;Walker - 4 wheels;Wheelchair - manual (by pt report) Additional Comments: When reviewing equipment, pt states "I don't know what all my daughter will have when I get there"    Prior Function Level of Independence: Independent               Hand Dominance        Extremity/Trunk Assessment   Upper Extremity Assessment Upper Extremity Assessment: Generalized weakness    Lower Extremity Assessment Lower Extremity Assessment: Generalized weakness    Cervical / Trunk Assessment Cervical / Trunk Assessment: Kyphotic  Communication   Communication: No difficulties  Cognition Arousal/Alertness: Awake/alert Behavior During Therapy: WFL for tasks assessed/performed;Flat affect Overall Cognitive Status: Within Functional Limits for tasks assessed  General Comments      Exercises     Assessment/Plan    PT Assessment Patient needs continued PT services  PT Problem List Decreased strength;Decreased activity tolerance;Decreased  balance;Decreased mobility;Decreased knowledge of use of DME;Pain       PT Treatment Interventions DME instruction;Gait training;Stair training;Functional mobility training;Therapeutic activities;Therapeutic exercise;Balance training;Patient/family education    PT Goals (Current goals can be found in the Care Plan section)  Acute Rehab PT Goals Patient Stated Goal: HOME PT Goal Formulation: With patient Time For Goal Achievement: 05/13/20 Potential to Achieve Goals: Good    Frequency Min 3X/week   Barriers to discharge        Co-evaluation               AM-PAC PT "6 Clicks" Mobility  Outcome Measure Help needed turning from your back to your side while in a flat bed without using bedrails?: None Help needed moving from lying on your back to sitting on the side of a flat bed without using bedrails?: None Help needed moving to and from a bed to a chair (including a wheelchair)?: A Little Help needed standing up from a chair using your arms (e.g., wheelchair or bedside chair)?: A Little Help needed to walk in hospital room?: A Little Help needed climbing 3-5 steps with a railing? : A Little 6 Click Score: 20    End of Session Equipment Utilized During Treatment: Gait belt Activity Tolerance: Patient tolerated treatment well;Patient limited by fatigue Patient left: in chair;with call bell/phone within reach;with chair alarm set Nurse Communication: Mobility status PT Visit Diagnosis: Difficulty in walking, not elsewhere classified (R26.2);Muscle weakness (generalized) (M62.81)    Time: 8811-0315 PT Time Calculation (min) (ACUTE ONLY): 33 min   Charges:   PT Evaluation $PT Eval Low Complexity: 1 Low PT Treatments $Gait Training: 8-22 mins        Debe Coder PT Acute Rehabilitation Services Pager [phone] Office [phone]   Loriana Samad 04/29/2020, 12:40 PM

## 2020-04-30 LAB — GLUCOSE, CAPILLARY
Glucose-Capillary: 214 mg/dL — ABNORMAL HIGH (ref 70–99)
Glucose-Capillary: 224 mg/dL — ABNORMAL HIGH (ref 70–99)
Glucose-Capillary: 230 mg/dL — ABNORMAL HIGH (ref 70–99)
Glucose-Capillary: 231 mg/dL — ABNORMAL HIGH (ref 70–99)

## 2020-04-30 MED ORDER — AMOXICILLIN-POT CLAVULANATE 250-62.5 MG/5ML PO SUSR: 500.0000 mg | Freq: Three times a day (TID) | ORAL | 0 refills | Status: AC

## 2020-04-30 MED ORDER — AMOXICILLIN-POT CLAVULANATE 250-62.5 MG/5ML PO SUSR
500.0000 mg | Freq: Three times a day (TID) | ORAL | Status: DC
  Filled 2020-04-30: qty 10

## 2020-04-30 MED ORDER — LISINOPRIL 40 MG PO TABS: 40.0000 mg | ORAL_TABLET | Freq: Every day | ORAL | Status: DC

## 2020-04-30 MED ORDER — OSMOLITE 1.2 CAL PO LIQD: 60.0000 mL/h | ORAL | 0 refills | Status: DC

## 2020-04-30 MED ORDER — FREE WATER: 100.0000 mL | Status: AC

## 2020-04-30 MED ORDER — HEPARIN SOD (PORK) LOCK FLUSH 100 UNIT/ML IV SOLN
500.0000 [IU] | INTRAVENOUS | Status: AC | PRN
  Administered 2020-04-30: 500 [IU]
  Filled 2020-04-30: qty 5

## 2020-04-30 MED ORDER — PANTOPRAZOLE SODIUM 40 MG PO PACK: 40.0000 mg | PACK | Freq: Every day | ORAL | 0 refills | Status: DC

## 2020-04-30 NOTE — Progress Notes (Signed)
PROGRESS NOTE    DOSHA Peterson  IRS:[id number] DOB: 1945-06-14 DOA: 04/24/2020 PCP: Lattie Haw, MD   Brief Narrative: Bonnie Peterson is a 75 y.o. female with a history of metastatic cholangiocarcinoma status post G-tube placement, COPD, OSA. She is a hospice patient. She presented secondary to abdominal pain with evidence of a malfunctioning G-tube. IR consulted for replacement and conversion to G-J tube. Evidence of aspiration pneumonia on chest x-ray and patient started on IV antibiotics. Plan for discharge home with hospice and continuous tube feeds.   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 G-tube was converted to G-J tube by IR. Patient now receiving tube feeds via G-J tube and is currently at goal of 60 ml/hr. Patient follows with hospice at home -Continue tube feeds -Dietitian recommendations: -Continue Osmolite 1.2 @ 40 ml/hr to advance by 10 ml every 8 hours to reach goal rate of 60 ml/hr via G-tube. -At goal rate, this regimen will provide 1728 kcal, 80 grams protein, and 1181 ml free water.  -100 ml free water every 4 hours (600 ml/day). -Will monitor for diet advancement  Aspiration pneumonia Chest x-ray significant for possible RLL pneumonia and with history, concerning for aspiration pneumonia. Started on Unasyn IV -Switch to Augmentin per tube   Metastatic cholangiocarcinoma Currently enrolled with hospice.  Primary hypertension -Continue lisinopril  COPD -Continue albuterol prn  Thrombocytopenia Stable.  Leukocytosis In setting of  aspiration. Trending down.  Sleep apnea Not on CPAP  Concern for parotid swelling Negative CT.   DVT prophylaxis: SCDs Code Status:   Code Status: Partial Code Family Communication: None at bedside Disposition Plan: Medically stable/ready for discharge. Discharge pending equipment and family teaching.   Consultants:   Interventional radiology  Procedures:   CONVERSION OF G TUBE TO G-J TUBE (04/24/2020)  Antimicrobials:  Unasyn    Subjective: No issues overnight.  Objective: Vitals:   04/29/20 0639 04/29/20 1327 04/29/20 2020 04/30/20 0417  BP: 138/66 139/64 (!) 148/71 (!) 146/62  Pulse: 91 83 79 72  Resp: 16 18 18 20   Temp: 97.9 F (36.6 C) 98 F (36.7 C) 98.1 F (36.7 C) 98 F (36.7 C)  TempSrc:  Oral Oral   SpO2: 98% 98% 99% 99%  Weight:        Intake/Output Summary (Last 24 hours) at 04/30/2020 1212 Last data filed at 04/30/2020 0600 Gross per 24 hour  Intake 2435.17 ml  Output 200 ml  Net 2235.17 ml   Filed Weights   04/26/20 1445 04/27/20 0500  Weight: 54.8 kg 54.3 kg    Examination:  General exam: Appears calm and comfortable Respiratory system: Clear to auscultation. Respiratory effort normal. Cardiovascular system: S1 & S2 heard, RRR. No murmurs, rubs, gallops or clicks. Gastrointestinal system: Abdomen is slightly distended, soft and nontender. No organomegaly or masses felt. Normal bowel sounds heard. Central nervous system: Alert and oriented. No focal neurological deficits. Musculoskeletal: No edema. No calf tenderness Skin: No cyanosis. No rashes Psychiatry: Judgement and insight appear normal. Mood & affect appropriate.      Data Reviewed: I have personally reviewed following labs and imaging studies  CBC Lab Results  Component Value Date   WBC 13.7 (H) 04/29/2020   RBC 4.10 04/29/2020   HGB 12.3 04/29/2020   HCT 38.7 04/29/2020   MCV 94.4 04/29/2020   MCH 30.0 04/29/2020   PLT 93 (L) 04/29/2020   MCHC 31.8 04/29/2020    RDW 16.9 (H) 04/29/2020   LYMPHSABS 1.0 04/28/2020   MONOABS 0.7 04/28/2020   EOSABS 0.0 04/28/2020   BASOSABS 0.0 28/41/3244     Last metabolic panel Lab Results  Component Value Date   NA 141 04/29/2020   K 3.9 04/29/2020   CL 110 04/29/2020   CO2 26 04/29/2020   BUN 22 04/29/2020   CREATININE 0.51 04/29/2020   GLUCOSE 238 (H) 04/29/2020   GFRNONAA >60 04/29/2020   GFRAA 102 02/25/2020   CALCIUM 8.0 (L) 04/29/2020   PHOS 2.5 04/27/2020   PROT 5.9 (L) 04/24/2020   ALBUMIN 2.8 (L) 04/24/2020   LABGLOB 3.1 07/22/2017   AGRATIO 1.2 07/22/2017   BILITOT 1.0 04/24/2020   ALKPHOS 129 (H) 04/24/2020   AST 53 (H) 04/24/2020   ALT 58 (H) 04/24/2020   ANIONGAP 5 04/29/2020    CBG (last 3)  Recent Labs    04/30/20 0413 04/30/20 0801 04/30/20 1034  GLUCAP 230* 224* 214*     GFR: Estimated Creatinine Clearance: 51.8 mL/min (by C-G formula based on SCr of 0.51 mg/dL).  Coagulation Profile: Recent Labs  Lab 04/24/20 0932  INR 1.1    Recent Results (from the past 240 hour(s))  SARS CORONAVIRUS 2 (TAT 6-24 HRS) Nasopharyngeal Nasopharyngeal Swab     Status: None   Collection Time: 04/24/20  1:41 PM   Specimen: Nasopharyngeal Swab  Result Value Ref Range Status   SARS Coronavirus 2 NEGATIVE NEGATIVE Final    Comment: (NOTE) SARS-CoV-2 target nucleic acids are NOT DETECTED.  The SARS-CoV-2 RNA is generally detectable in upper and lower respiratory specimens during the acute phase of infection. Negative results do not preclude SARS-CoV-2 infection, do not rule out co-infections with other pathogens, and should not be used as the sole basis for treatment or other patient management decisions. Negative results must be combined with clinical observations, patient history, and epidemiological information. The expected result is Negative.  Fact Sheet for Patients: SugarRoll.be  Fact Sheet for Healthcare  Providers: https://www.woods-mathews.com/  This test is not yet approved or cleared by the [place] FDA and  has been authorized for detection and/or diagnosis of SARS-CoV-2 by FDA under an Emergency Use Authorization (EUA). This EUA will remain  in effect (meaning this test can be used) for the duration of the COVID-19 declaration under Se ction 564(b)(1) of the Act, 21 U.S.C. section 360bbb-3(b)(1), unless the authorization is terminated or revoked sooner.  Performed at [facility], [place] [address] [place], [place] [address]         Radiology Studies: No results found.      Scheduled Meds: . Chlorhexidine Gluconate Cloth  6 each Topical Daily  . free water  100 mL Per Tube Q4H  . insulin aspart  0-9 Units Subcutaneous Q4H  . lisinopril  40 mg Per Tube Daily  . pantoprazole sodium  40 mg Per Tube Daily  . predniSONE  40 mg Per Tube Q breakfast   Continuous Infusions: . ampicillin-sulbactam (UNASYN) IV 3 g (04/30/20 1118)  . feeding supplement (OSMOLITE 1.2 CAL) 1,000 mL (04/29/20 1526)     LOS: 6 days     Cordelia Poche, MD [facility] 04/30/2020,  12:12 PM  If 7PM-7AM, please contact night-coverage [email or web address]

## 2020-04-30 NOTE — Progress Notes (Signed)
Pt's daughter here at bedside.  Nurse has performed teaching about the pump, pt's feeding and how to administer medications through the pump.  Pt daughter, [name], stated she feels that she will be about to manage pt's feedings and medications.  Daughter stated that all equipment has been delivered to her home and hospice will be out tomorrow.  All questions answered.  Discharge instructions reviewed with pt and [name].  Both verbalized understanding of discharge instructions, and new medications.  PTAR arranged for pt to get home.

## 2020-04-30 NOTE — TOC Progression Note (Signed)
Transition of Care [facility]) - Progression Note    Patient Details  Name: Bonnie Peterson MRN: [id number] Date of Birth: [birth year]  [facility]) CM/SW Contact  Joaquin Courts, RN Phone Number: 04/30/2020, 1:45 PM  Clinical Narrative:    CM received confirmation that tube feeding equipment has been delivered to the home.  Patient is ready for dc home, transport arranged with [facility].      Barriers to Discharge: No Barriers Identified  Expected Discharge Plan and Services           Expected Discharge Date: 04/30/20                                     Social Determinants of Health (SDOH) Interventions    Readmission Risk Interventions No flowsheet data found.

## 2020-05-03 ENCOUNTER — Telehealth: Payer: Self-pay

## 2020-05-03 ENCOUNTER — Telehealth: Payer: Self-pay | Admitting: Hematology

## 2020-05-03 ENCOUNTER — Other Ambulatory Visit (HOSPITAL_COMMUNITY): Payer: Self-pay | Admitting: Hematology

## 2020-05-03 ENCOUNTER — Other Ambulatory Visit (HOSPITAL_COMMUNITY): Payer: Medicare HMO

## 2020-05-03 DIAGNOSIS — T85598S Other mechanical complication of other gastrointestinal prosthetic devices, implants and grafts, sequela: Secondary | ICD-10-CM

## 2020-05-03 NOTE — Telephone Encounter (Signed)
Called pt's hospice nurse Bonnie Peterson back. She saw pt 2 days ago and her GJ tube was working well without any issue. Pt's daughter Bonnie Peterson reported severe leakage around tube insertion site today. I called [facility] IR and they will arrange an appointment to evaluate her GJ tube.   Truitt Merle  05/03/2020 11:40 AM

## 2020-05-03 NOTE — Telephone Encounter (Signed)
Larna Daughters from [facility] hospice called requesting to speak with Dr Burr Medico regarding patient's g-j tube.  She has been receiving enteral feedings via this and now the feeding is leaking around the exit site of the tube.  Please call her [phone].

## 2020-05-04 ENCOUNTER — Other Ambulatory Visit (HOSPITAL_COMMUNITY): Payer: Self-pay | Admitting: Hematology

## 2020-05-04 ENCOUNTER — Ambulatory Visit (HOSPITAL_COMMUNITY)
Admission: RE | Admit: 2020-05-04 | Discharge: 2020-05-04 | Disposition: A | Discharge status: Home / Self Care | Payer: Medicare HMO | Source: Ambulatory Visit | Attending: Hematology | Admitting: Hematology

## 2020-05-04 DIAGNOSIS — T85598S Other mechanical complication of other gastrointestinal prosthetic devices, implants and grafts, sequela: Secondary | ICD-10-CM

## 2020-05-04 DIAGNOSIS — Z743 Need for continuous supervision: Secondary | ICD-10-CM | POA: Diagnosis not present

## 2020-05-04 DIAGNOSIS — R279 Unspecified lack of coordination: Secondary | ICD-10-CM | POA: Diagnosis not present

## 2020-05-04 DIAGNOSIS — R5381 Other malaise: Secondary | ICD-10-CM | POA: Diagnosis not present

## 2020-05-04 NOTE — Progress Notes (Signed)
IR.  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  Patient and daughter present today secondary to excessive leakage around [place] tube site. Per patient/daughter, they are using G limb for feeds and medications (and patient does take some medications by mouth).  On PE, GJ tube with bumper approximately 1-2 inches from abdominal wall. There is drainage of gastric contents around GJ tube insertion site, however no active bleeding, erythema, or skin breakdown noted. Both G and J limbs flush without resistance.  Recommend patient keep bumper cinched to skin to prevent leakage. In addition, recommend G limb for medication administration and J limb for nutritional administration. Also recommend patient sit semi-reclined (45 degree angle, not sitting straight up or laying flat) during medication/nutritional administration to help prevent leakage as well. These recommendations discussed with daughter and patient. Dr. Vernard Gambles made aware as well.  Please call IR with questions/concerns.   Bea Graff Terria Deschepper, PA-C 05/04/2020, 2:43 PM

## 2020-05-05 ENCOUNTER — Encounter (HOSPITAL_COMMUNITY): Payer: Self-pay | Admitting: Emergency Medicine

## 2020-05-05 ENCOUNTER — Other Ambulatory Visit: Payer: Self-pay

## 2020-05-05 ENCOUNTER — Emergency Department (HOSPITAL_COMMUNITY)

## 2020-05-05 ENCOUNTER — Telehealth: Payer: Self-pay | Admitting: Student

## 2020-05-05 ENCOUNTER — Inpatient Hospital Stay (HOSPITAL_COMMUNITY)
Admission: EM | Admit: 2020-05-05 | Discharge: 2020-05-11 | DRG: 393 | Disposition: A | Discharge status: Home / Self Care | Attending: Family Medicine | Admitting: Family Medicine

## 2020-05-05 DIAGNOSIS — D638 Anemia in other chronic diseases classified elsewhere: Secondary | ICD-10-CM | POA: Diagnosis present

## 2020-05-05 DIAGNOSIS — K219 Gastro-esophageal reflux disease without esophagitis: Secondary | ICD-10-CM | POA: Diagnosis not present

## 2020-05-05 DIAGNOSIS — Z884 Allergy status to anesthetic agent status: Secondary | ICD-10-CM | POA: Diagnosis not present

## 2020-05-05 DIAGNOSIS — Z66 Do not resuscitate: Secondary | ICD-10-CM | POA: Diagnosis present

## 2020-05-05 DIAGNOSIS — Z794 Long term (current) use of insulin: Secondary | ICD-10-CM

## 2020-05-05 DIAGNOSIS — C221 Intrahepatic bile duct carcinoma: Secondary | ICD-10-CM | POA: Diagnosis present

## 2020-05-05 DIAGNOSIS — J449 Chronic obstructive pulmonary disease, unspecified: Secondary | ICD-10-CM | POA: Diagnosis present

## 2020-05-05 DIAGNOSIS — R52 Pain, unspecified: Secondary | ICD-10-CM | POA: Diagnosis not present

## 2020-05-05 DIAGNOSIS — Z431 Encounter for attention to gastrostomy: Secondary | ICD-10-CM | POA: Diagnosis not present

## 2020-05-05 DIAGNOSIS — E119 Type 2 diabetes mellitus without complications: Secondary | ICD-10-CM | POA: Diagnosis not present

## 2020-05-05 DIAGNOSIS — I10 Essential (primary) hypertension: Secondary | ICD-10-CM | POA: Diagnosis present

## 2020-05-05 DIAGNOSIS — K2100 Gastro-esophageal reflux disease with esophagitis, without bleeding: Secondary | ICD-10-CM | POA: Diagnosis present

## 2020-05-05 DIAGNOSIS — E876 Hypokalemia: Secondary | ICD-10-CM | POA: Diagnosis present

## 2020-05-05 DIAGNOSIS — G4733 Obstructive sleep apnea (adult) (pediatric): Secondary | ICD-10-CM | POA: Diagnosis present

## 2020-05-05 DIAGNOSIS — Z20822 Contact with and (suspected) exposure to covid-19: Secondary | ICD-10-CM | POA: Diagnosis present

## 2020-05-05 DIAGNOSIS — E440 Moderate protein-calorie malnutrition: Secondary | ICD-10-CM | POA: Diagnosis present

## 2020-05-05 DIAGNOSIS — R69 Illness, unspecified: Secondary | ICD-10-CM | POA: Diagnosis not present

## 2020-05-05 DIAGNOSIS — Z8249 Family history of ischemic heart disease and other diseases of the circulatory system: Secondary | ICD-10-CM

## 2020-05-05 DIAGNOSIS — K9423 Gastrostomy malfunction: Principal | ICD-10-CM | POA: Diagnosis present

## 2020-05-05 DIAGNOSIS — D696 Thrombocytopenia, unspecified: Secondary | ICD-10-CM | POA: Diagnosis present

## 2020-05-05 DIAGNOSIS — J690 Pneumonitis due to inhalation of food and vomit: Secondary | ICD-10-CM | POA: Diagnosis present

## 2020-05-05 DIAGNOSIS — F419 Anxiety disorder, unspecified: Secondary | ICD-10-CM | POA: Diagnosis present

## 2020-05-05 DIAGNOSIS — Z79899 Other long term (current) drug therapy: Secondary | ICD-10-CM | POA: Diagnosis not present

## 2020-05-05 DIAGNOSIS — K922 Gastrointestinal hemorrhage, unspecified: Secondary | ICD-10-CM | POA: Diagnosis present

## 2020-05-05 DIAGNOSIS — K921 Melena: Secondary | ICD-10-CM

## 2020-05-05 DIAGNOSIS — Z4682 Encounter for fitting and adjustment of non-vascular catheter: Secondary | ICD-10-CM | POA: Diagnosis not present

## 2020-05-05 DIAGNOSIS — D62 Acute posthemorrhagic anemia: Secondary | ICD-10-CM | POA: Diagnosis present

## 2020-05-05 DIAGNOSIS — E785 Hyperlipidemia, unspecified: Secondary | ICD-10-CM | POA: Diagnosis present

## 2020-05-05 DIAGNOSIS — K311 Adult hypertrophic pyloric stenosis: Secondary | ICD-10-CM | POA: Diagnosis present

## 2020-05-05 DIAGNOSIS — L89153 Pressure ulcer of sacral region, stage 3: Secondary | ICD-10-CM | POA: Diagnosis present

## 2020-05-05 DIAGNOSIS — K9413 Enterostomy malfunction: Secondary | ICD-10-CM | POA: Diagnosis not present

## 2020-05-05 DIAGNOSIS — K315 Obstruction of duodenum: Secondary | ICD-10-CM | POA: Diagnosis present

## 2020-05-05 DIAGNOSIS — C228 Malignant neoplasm of liver, primary, unspecified as to type: Secondary | ICD-10-CM | POA: Diagnosis not present

## 2020-05-05 DIAGNOSIS — R64 Cachexia: Secondary | ICD-10-CM | POA: Diagnosis present

## 2020-05-05 DIAGNOSIS — K625 Hemorrhage of anus and rectum: Secondary | ICD-10-CM | POA: Diagnosis not present

## 2020-05-05 DIAGNOSIS — C787 Secondary malignant neoplasm of liver and intrahepatic bile duct: Secondary | ICD-10-CM | POA: Diagnosis present

## 2020-05-05 DIAGNOSIS — D509 Iron deficiency anemia, unspecified: Secondary | ICD-10-CM | POA: Diagnosis not present

## 2020-05-05 DIAGNOSIS — Z800 Family history of malignant neoplasm of digestive organs: Secondary | ICD-10-CM

## 2020-05-05 DIAGNOSIS — R1310 Dysphagia, unspecified: Secondary | ICD-10-CM | POA: Diagnosis present

## 2020-05-05 DIAGNOSIS — Z515 Encounter for palliative care: Secondary | ICD-10-CM

## 2020-05-05 DIAGNOSIS — Z87891 Personal history of nicotine dependence: Secondary | ICD-10-CM

## 2020-05-05 DIAGNOSIS — F32A Depression, unspecified: Secondary | ICD-10-CM | POA: Diagnosis present

## 2020-05-05 DIAGNOSIS — Z833 Family history of diabetes mellitus: Secondary | ICD-10-CM

## 2020-05-05 DIAGNOSIS — K2971 Gastritis, unspecified, with bleeding: Secondary | ICD-10-CM | POA: Diagnosis not present

## 2020-05-05 DIAGNOSIS — Z6821 Body mass index (BMI) 21.0-21.9, adult: Secondary | ICD-10-CM

## 2020-05-05 DIAGNOSIS — Z7189 Other specified counseling: Secondary | ICD-10-CM | POA: Diagnosis not present

## 2020-05-05 DIAGNOSIS — R633 Feeding difficulties, unspecified: Secondary | ICD-10-CM | POA: Diagnosis not present

## 2020-05-05 LAB — CBC WITH DIFFERENTIAL/PLATELET
Abs Immature Granulocytes: 0.02 10*3/uL (ref 0.00–0.07)
Basophils Absolute: 0 10*3/uL (ref 0.0–0.1)
Basophils Relative: 0 %
Eosinophils Absolute: 0 10*3/uL (ref 0.0–0.5)
Eosinophils Relative: 0 %
HCT: 27 % — ABNORMAL LOW (ref 36.0–46.0)
Hemoglobin: 8.9 g/dL — ABNORMAL LOW (ref 12.0–15.0)
Immature Granulocytes: 0 %
Lymphocytes Relative: 11 %
Lymphs Abs: 0.6 10*3/uL — ABNORMAL LOW (ref 0.7–4.0)
MCH: 30.9 pg (ref 26.0–34.0)
MCHC: 33 g/dL (ref 30.0–36.0)
MCV: 93.8 fL (ref 80.0–100.0)
Monocytes Absolute: 0.3 10*3/uL (ref 0.1–1.0)
Monocytes Relative: 5 %
Neutro Abs: 4.7 10*3/uL (ref 1.7–7.7)
Neutrophils Relative %: 84 %
Platelets: 102 10*3/uL — ABNORMAL LOW (ref 150–400)
RBC: 2.88 MIL/uL — ABNORMAL LOW (ref 3.87–5.11)
RDW: 16.5 % — ABNORMAL HIGH (ref 11.5–15.5)
WBC: 5.6 10*3/uL (ref 4.0–10.5)
nRBC: 0 % (ref 0.0–0.2)

## 2020-05-05 LAB — TYPE AND SCREEN
ABO/RH(D): A NEG
Antibody Screen: NEGATIVE

## 2020-05-05 LAB — BASIC METABOLIC PANEL
Anion gap: 10 (ref 5–15)
BUN: 18 mg/dL (ref 8–23)
CO2: 24 mmol/L (ref 22–32)
Calcium: 8.8 mg/dL — ABNORMAL LOW (ref 8.9–10.3)
Chloride: 106 mmol/L (ref 98–111)
Creatinine, Ser: 0.58 mg/dL (ref 0.44–1.00)
GFR, Estimated: >60 mL/min (ref >60)
Glucose, Bld: 107 mg/dL — ABNORMAL HIGH (ref 70–99)
Potassium: 3.9 mmol/L (ref 3.5–5.1)
Sodium: 140 mmol/L (ref 135–145)

## 2020-05-05 LAB — MAGNESIUM: Magnesium: 2.1 mg/dL (ref 1.7–2.4)

## 2020-05-05 LAB — CBC
HCT: 26.9 % — ABNORMAL LOW (ref 36.0–46.0)
Hemoglobin: 8.8 g/dL — ABNORMAL LOW (ref 12.0–15.0)
MCH: 31.1 pg (ref 26.0–34.0)
MCHC: 32.7 g/dL (ref 30.0–36.0)
MCV: 95.1 fL (ref 80.0–100.0)
Platelets: 104 10*3/uL — ABNORMAL LOW (ref 150–400)
RBC: 2.83 MIL/uL — ABNORMAL LOW (ref 3.87–5.11)
RDW: 16.8 % — ABNORMAL HIGH (ref 11.5–15.5)
WBC: 5.5 10*3/uL (ref 4.0–10.5)
nRBC: 0 % (ref 0.0–0.2)

## 2020-05-05 LAB — ABO/RH: ABO/RH(D): A NEG

## 2020-05-05 LAB — SARS CORONAVIRUS 2 BY RT PCR (HOSPITAL ORDER, PERFORMED IN ~~LOC~~ HOSPITAL LAB): SARS Coronavirus 2: NEGATIVE

## 2020-05-05 MED ORDER — IOHEXOL 300 MG/ML  SOLN
30.0000 mL | Freq: Once | INTRAMUSCULAR | Status: AC | PRN
  Administered 2020-05-05: 30 mL

## 2020-05-05 MED ORDER — LIDOCAINE 5 % EX OINT
TOPICAL_OINTMENT | Freq: Four times a day (QID) | CUTANEOUS | Status: DC | PRN
  Administered 2020-05-05: 1 via TOPICAL
  Filled 2020-05-05 (×2): qty 35.44

## 2020-05-05 MED ORDER — HYDROMORPHONE HCL 1 MG/ML IJ SOLN
1.0000 mg | Freq: Once | INTRAMUSCULAR | Status: AC
  Administered 2020-05-05: 1 mg via INTRAVENOUS
  Filled 2020-05-05: qty 1

## 2020-05-05 MED ORDER — ONDANSETRON HCL 4 MG PO TABS: 4.0000 mg | ORAL_TABLET | Freq: Four times a day (QID) | ORAL | Status: DC | PRN

## 2020-05-05 MED ORDER — ONDANSETRON HCL 4 MG/2ML IJ SOLN
4.0000 mg | Freq: Four times a day (QID) | INTRAMUSCULAR | Status: DC | PRN
  Administered 2020-05-06 – 2020-05-11 (×5): 4 mg via INTRAVENOUS
  Filled 2020-05-05 (×5): qty 2

## 2020-05-05 MED ORDER — DEXTROSE-NACL 5-0.9 % IV SOLN: INTRAVENOUS | Status: DC

## 2020-05-05 MED ORDER — ONDANSETRON HCL 4 MG/2ML IJ SOLN: 4.0000 mg | Freq: Once | INTRAMUSCULAR | Status: DC

## 2020-05-05 MED ORDER — AMOXICILLIN-POT CLAVULANATE 250-62.5 MG/5ML PO SUSR
500.0000 mg | Freq: Three times a day (TID) | ORAL | Status: DC
  Administered 2020-05-05 – 2020-05-09 (×11): 500 mg
  Filled 2020-05-05 (×19): qty 10

## 2020-05-05 NOTE — ED Notes (Addendum)
Flushed patients GJ tube,had no resistance met and did not see signs of leakage.

## 2020-05-05 NOTE — ED Notes (Signed)
Went to access pt port, but patient transported to [facility]

## 2020-05-05 NOTE — ED Triage Notes (Addendum)
[facility] transported pt from home and reports the following:  Pt reports her peg tube is leaking. She's had issues with the peg tube for a month. Issues were not resolved yesterday at radiology. Pt reports a sore in her sacrum area. Pt has received 3 Pfizer vaccines. Booster might have been received in Nov 2021, but she does not remember exact date.

## 2020-05-05 NOTE — H&P (Signed)
History and Physical   Bonnie Peterson XNT:[id number] DOB: 1945/10/18 DOA: 05/05/2020  Referring MD/NP/PA: Margarita Mail, PA  PCP: Lattie Haw, MD   Outpatient Specialists: Dr. Burr Medico, oncology  Patient coming from: Home  Chief Complaint: GJ tube malfunction  HPI: Bonnie Peterson is a 75 y.o. 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  Patient also has sacral decubitus ulcer.  Daughter brought patient in secondary to [place] tube malfunction.  She recently had the tube replaced while in the hospital last week.  They noted difficulty with residuals as well as gastric emptying.  Patient was taken antibiotics with liquid Augmentin at home.  Suspected to have aspiration pneumonia.  She returned today with a leakage of the tube.  She is also having diarrhea.  Patient also noted to have dropped 3 g hemoglobin since discharge last week.  Gastro intestinal bleed suspected.  GI consulted but patient currently on hospice.  Family still want patient treated.  We are admitting the patient for observation and monitoring her H&H.  Also to have her GJ tube reevaluated by radiology..  ED Course: Temperature 97.6 blood pressure 166/63 pulse 74 respirate 28 oxygen sat 94% room air.  White count is 5.5 hemoglobin 8.8 platelets 104.  Chemistry showed calcium 8.8.  KUB showed GJ tube appears to be in expected appropriate position.  Injected contrast appears to be in the stomach.  Patient will be admitted for evaluation of her GI bleed.  Review of Systems: As per HPI otherwise 10 point review of systems negative.    Past Medical History:  Diagnosis Date  . Anxiety   . Benign positional vertigo   . Cancer ([place]) 02/09/15   intrahepatic cholangiocarcinoma  . COPD (chronic obstructive  pulmonary disease) ([place])   . Depression   . Diabetes mellitus without complication ([place])   . Early cataracts, bilateral 10/13   Optho, Dr Gershon Crane  . Echocardiogram findings abnormal, without diagnosis 10/10   10/10: mild pulm HTN, EF 60-65%, mild LVH, moderate aortic regurg  . GERD (gastroesophageal reflux disease)    I have "acid reflux"  . Gout   . HLD (hyperlipidemia)   . HTN, goal below 130/80   . Irritable bowel syndrome   . Obesity, Class III, BMI 40-49.9 (morbid obesity) ([place])   . Obstructive sleep apnea    wears cpap  . Osteoarthritis (arthritis due to wear and tear of joints)    also gout  . Restrictive lung disease     Past Surgical History:  Procedure Laterality Date  . ABDOMINAL HYSTERECTOMY    . BIOPSY  02/18/2020   Procedure: BIOPSY;  Surgeon: Thornton Park, MD;  Location: [facility];  Service: Gastroenterology;;  . COLONOSCOPY WITH PROPOFOL N/A 08/12/2019   Procedure: COLONOSCOPY WITH PROPOFOL;  Surgeon: [name], MD;  Location: [facility];  Service: Endoscopy;  Laterality: N/A;  . ESOPHAGOGASTRODUODENOSCOPY N/A 06/15/2015   Procedure: ESOPHAGOGASTRODUODENOSCOPY (EGD);  Surgeon: Gatha Mayer, MD;  Location: [facility];  Service: Endoscopy;  Laterality: N/A;  . ESOPHAGOGASTRODUODENOSCOPY (EGD) WITH PROPOFOL N/A 02/18/2020   Procedure: ESOPHAGOGASTRODUODENOSCOPY (EGD) WITH PROPOFOL;  Surgeon: Thornton Park, MD;  Location: [facility];  Service: Gastroenterology;  Laterality: N/A;  . IR GASTR TUBE CONVERT GASTR-JEJ PER W/FL MOD SED  02/21/2020  . IR GASTR TUBE CONVERT GASTR-JEJ PER W/FL MOD SED  04/24/2020  . IR GASTROSTOMY TUBE MOD SED  02/21/2020  . IR IMAGING GUIDED PORT INSERTION  06/15/2019  . IR INTRO LONG GI TUBE  04/24/2020  . IR REMOVAL TUN ACCESS W/ PORT W/O FL MOD SED  11/11/2016  . LIVER BIOPSY    . OPEN PARTIAL HEPATECTOMY  Left 02/09/15  . PARTIAL HYSTERECTOMY    . ROTATOR CUFF REPAIR       L rotator cuff repair 11/07-[place] - 12/7/200   . TONSILLECTOMY       reports that she quit smoking about 2 years ago. Her smoking use included cigarettes. She has a 24.50 pack-year smoking history. She has never used smokeless tobacco. She reports current alcohol use. She reports current drug use. Drug: Marijuana.  Allergies  Allergen Reactions  . Propofol Other (See Comments)    "It makes me feel like I'm on fire."    Family History  Problem Relation Age of Onset  . Breast cancer Mother   . Hypertension Mother   . Coronary artery disease Mother   . Diabetes type II Mother   . Rheum arthritis Mother   . Diabetes type II Sister   . Pancreatic cancer Sister      Prior to Admission medications   Medication Sig Start Date End Date Taking? Authorizing Provider  albuterol (PROAIR HFA) 108 (90 Base) MCG/ACT inhaler Inhale 1-2 puffs into the lungs every 6 (six) hours as needed for wheezing or shortness of breath. 07/23/19   Parrett, Fonnie Mu, NP  dexamethasone (DECADRON) 4 MG tablet Take 1 tablet (4 mg total) by mouth 2 (two) times daily with a meal. 04/13/20   Truitt Merle, MD  dicyclomine (BENTYL) 20 MG tablet Take 1 tablet (20 mg total) by mouth 2 (two) times daily as needed for spasms (abdominal pain). Patient taking differently: Take 20 mg by mouth 2 (two) times daily as needed for spasms. 01/11/20   Gareth Morgan, MD  famotidine (PEPCID) 20 MG tablet Take 1 tablet (20 mg total) by mouth 2 (two) times daily. Patient taking differently: Take 20 mg by mouth 2 (two) times daily as needed for heartburn. 06/26/19   Lattie Haw, MD  fluticasone (FLONASE) 50 MCG/ACT nasal spray Place 2 sprays into both nostrils daily as needed for rhinitis. 04/17/16   Haney, Yetta Flock A, MD  HYDROmorphone (DILAUDID) 2 MG tablet Take 1 tablet (2 mg total) by mouth every 4 (four) hours as needed for severe pain. 02/22/20   Caren Griffins, MD  lisinopril (ZESTRIL) 40 MG tablet Place 1 tablet (40 mg total) into feeding tube daily. 04/30/20   Mariel Aloe, MD   LORazepam (ATIVAN) 0.5 MG tablet Take 0.5 mg by mouth every 8 (eight) hours as needed for anxiety.    [provider]  Nutritional Supplements (FEEDING SUPPLEMENT, OSMOLITE 1.2 CAL,) LIQD Place 60 mL/hr into feeding tube continuous. 04/30/20   Mariel Aloe, MD  pantoprazole sodium (PROTONIX) 40 mg/20 mL PACK Place 20 mLs (40 mg total) into feeding tube daily. 05/01/20 05/31/20  Mariel Aloe, MD  Water For Irrigation, Sterile (FREE WATER) SOLN Place 100 mLs into feeding tube every 4 (four) hours. 04/30/20   Mariel Aloe, MD    Physical Exam: Vitals:   05/05/20 1815 05/05/20 1830 05/05/20 1845 05/05/20 1900  BP: (!) 130/46 (!) 128/43 (!) 123/43 (!) 136/44  Pulse: (!) 59 66 62 68  Resp: 15 16 14  (!) 21  Temp:      TempSrc:  SpO2: 94% 97% 97% 99%  Weight:      Height:          Constitutional: Cachectic, chronically ill looking, no significant distress Vitals:   05/05/20 1815 05/05/20 1830 05/05/20 1845 05/05/20 1900  BP: (!) 130/46 (!) 128/43 (!) 123/43 (!) 136/44  Pulse: (!) 59 66 62 68  Resp: 15 16 14  (!) 21  Temp:      TempSrc:      SpO2: 94% 97% 97% 99%  Weight:      Height:       Eyes: PERRL, lids and conjunctivae normal ENMT: Mucous membranes are dry posterior pharynx clear of any exudate or lesions.Normal dentition.  Neck: normal, supple, no masses, no thyromegaly Respiratory: clear to auscultation bilaterally, no wheezing, no crackles. Normal respiratory effort. No accessory muscle use.  Cardiovascular: Regular rate and rhythm, no murmurs / rubs / gallops. No extremity edema. 2+ pedal pulses. No carotid bruits.  Abdomen: Scaphoid abdomen, GJ tube noted with visible bleeding drainage.  Musculoskeletal: no clubbing / cyanosis. No joint deformity upper and lower extremities. Good ROM, no contractures.  Decreased muscle mass skin: no rashes, lesions, ulcers. No induration Neurologic: CN 2-12 grossly intact. Sensation intact, DTR normal. Strength 5/5 in all  4.  Psychiatric: Drowsy and confused  Labs on Admission: I have personally reviewed following labs and imaging studies  CBC: Recent Labs  Lab 04/29/20 0400 05/05/20 1756  WBC 13.7* 5.6  NEUTROABS  --  4.7  HGB 12.3 8.9*  HCT 38.7 27.0*  MCV 94.4 93.8  PLT 93* 326*   Basic Metabolic Panel: Recent Labs  Lab 04/29/20 0400 05/05/20 1756  NA 141 140  K 3.9 3.9  CL 110 106  CO2 26 24  GLUCOSE 238* 107*  BUN 22 18  CREATININE 0.51 0.58  CALCIUM 8.0* 8.8*  MG 1.9 2.1   GFR: Estimated Creatinine Clearance: 51.8 mL/min (by C-G formula based on SCr of 0.58 mg/dL). Liver Function Tests: No results for input(s): AST, ALT, ALKPHOS, BILITOT, PROT, ALBUMIN in the last 168 hours. No results for input(s): LIPASE, AMYLASE in the last 168 hours. No results for input(s): AMMONIA in the last 168 hours. Coagulation Profile: No results for input(s): INR, PROTIME in the last 168 hours. Cardiac Enzymes: No results for input(s): CKTOTAL, CKMB, CKMBINDEX, TROPONINI in the last 168 hours. BNP (last 3 results) No results for input(s): PROBNP in the last 8760 hours. HbA1C: No results for input(s): HGBA1C in the last 72 hours. CBG: Recent Labs  Lab 04/29/20 2010 04/30/20 0001 04/30/20 0413 04/30/20 0801 04/30/20 1034  GLUCAP 205* 231* 230* 224* 214*   Lipid Profile: No results for input(s): CHOL, HDL, LDLCALC, TRIG, CHOLHDL, LDLDIRECT in the last 72 hours. Thyroid Function Tests: No results for input(s): TSH, T4TOTAL, FREET4, T3FREE, THYROIDAB in the last 72 hours. Anemia Panel: No results for input(s): VITAMINB12, FOLATE, FERRITIN, TIBC, IRON, RETICCTPCT in the last 72 hours. Urine analysis:    Component Value Date/Time   COLORURINE AMBER (A) 02/14/2020 1112   APPEARANCEUR HAZY (A) 02/14/2020 1112   LABSPEC 1.027 02/14/2020 1112   PHURINE 5.0 02/14/2020 1112   GLUCOSEU NEGATIVE 02/14/2020 1112   HGBUR NEGATIVE 02/14/2020 1112   BILIRUBINUR NEGATIVE 02/14/2020 1112    BILIRUBINUR negative 01/04/2013 1530   KETONESUR 5 (A) 02/14/2020 1112   PROTEINUR 100 (A) 02/14/2020 1112   UROBILINOGEN 0.2 10/09/2013 2037   NITRITE NEGATIVE 02/14/2020 1112   LEUKOCYTESUR NEGATIVE 02/14/2020 1112   Sepsis Labs: @LABRCNTIP (procalcitonin:4,lacticidven:4) )No  results found for this or any previous visit (from the past 240 hour(s)).   Radiological Exams on Admission: DG ABDOMEN PEG TUBE LOCATION  Result Date: 05/05/2020 CLINICAL DATA:  Leaking around [place] tube EXAM: ABDOMEN - 1 VIEW COMPARISON:  04/25/2020 FINDINGS: Gastrojejunostomy tube is in place. Contrast was injected through the gastrostomy portion of the of the tube which fills the stomach. No evidence of contrast extravasation or leak. The jejunostomy portion of the catheter is in the proximal jejunum. No evidence of bowel obstruction. Visualized lung bases clear. IMPRESSION: Gastrojejunostomy appears to be in expected and appropriate position. Injected contrast is in the stomach. Electronically Signed   By: Rolm Baptise M.D.   On: 05/05/2020 17:53      Assessment/Plan Principal Problem:   GI bleed Active Problems:   Chronic obstructive pulmonary disease (HCC)   Type 2 diabetes mellitus without complication, without long-term current use of insulin (HCC)   Gastroesophageal reflux disease without esophagitis   Attention to G-tube ([place])   Cholangiocarcinoma (HCC)     #1 suspected GI bleed: Patient's hemoglobin has dropped at least 3 g in the week.  Daughter has declined digital rectal exam with Hemoccult.  We will admit the patient.  IV Protonix.  Fecal occult blood test whenever stool is obtained.  Monitor H&H every 6 hours.  If stable patient may be discharged if she drops her hemoglobin further family interested in transfusion.  GI has already been consulted but patient not a candidate for aggressive intervention.  #2 severe dysphagia: GJ tube malfunction.  IR to reevaluate tomorrow.  It appears to be in good  positioning.  #3 diabetes: Currently would be NPO.  Sliding scale insulin  #4 malignant cholangiocarcinoma: Currently on hospice care.  #5 COPD: No exacerbation     DVT prophylaxis: SCD Code Status: DNR Family Communication: Daughter at bedside Disposition Plan: Home with hospice Consults called: Dr. Benson [place], gastroenterology Admission status: Inpatient  Severity of Illness: The appropriate patient status for this patient is INPATIENT. Inpatient status is judged to be reasonable and necessary in order to provide the required intensity of service to ensure the patient's safety. The patient's presenting symptoms, physical exam findings, and initial radiographic and laboratory data in the context of their chronic comorbidities is felt to place them at high risk for further clinical deterioration. Furthermore, it is not anticipated that the patient will be medically stable for discharge from the hospital within 2 midnights of admission. The following factors support the patient status of inpatient.   " The patient's presenting symptoms include GJ tube malfunction. " The worrisome physical exam findings include severe cachexia. " The initial radiographic and laboratory data are worrisome because of hemoglobin dropping to 8.9. " The chronic co-morbidities include malignant cholangiocarcinoma.   * I certify that at the point of admission it is my clinical judgment that the patient will require inpatient hospital care spanning beyond 2 midnights from the point of admission due to high intensity of service, high risk for further deterioration and high frequency of surveillance required.Barbette Merino MD [facility] Pager [phone]  If 7PM-7AM, please contact night-coverage [email or web address] Password [facility]  05/05/2020, 7:55 PM

## 2020-05-05 NOTE — Telephone Encounter (Signed)
G-J tube evaluation 05/04/20 for leaking around gastrostomy site. IR found the tube to be easily flushed and without any explanation for excessive leaking except the bumper was not properly cinched. IR advised [name] (patient's daughter) to use the g-port for medications and the j-port for nutrition; patient was discharged home.   [name] called IR today saying that not long after they arrived home yesterday the excessive leaking returned except worse. She said she was having to change the dressing around GJ-tube approximately every 30 minutes and that the site was leaking even without any medication/nutritional administration. She described the drainage as being "dark/bloody". She also stated her mother was having significant pain. The recommendation was made for [name] to bring her mother to the ED for further evaluation. [name] was in agreement with this plan.   Soyla Dryer, [place] [phone] 05/05/2020, 1:15 PM

## 2020-05-05 NOTE — ED Provider Notes (Signed)
[facility] DEPT Provider Note   CSN: [id number] Arrival date & time: 05/05/20  1546     History Chief Complaint  Patient presents with  . Leaking peg tube    Bonnie Peterson is a 75 y.o. female with a past medical history metastatic and intrahepatic cholangiocarcinoma.  She is currently under hospice care.  She presents emergency department for gastrostomy tube dysfunction.  Was recently admitted with G-tube dysfunction and underwent a change out to a GJ tube by IR during her recent admission.  Prior to admission the patient had continued to eat solid food and required NG tube placement as well for obstruction.  She was discharged on the 23rd.  Since that time the patient is continue to have significant problems.  She and her daughter states that every time they put a feeding in it immediately pours out around the bumper.  She has had foul-smelling odor and bloody and mucousy discharge coming out from the G-tube site.  She was seen by IR yesterday.  She did not have any imaging but was told by the PA that she should make sure to hold the bumper close to the abdominal wall during feeds and had the flushes checked.  She was also told to sit at a 45 degree angle in a semierect position.  The patient has had difficulty doing this because of a large sacral ulcer that causes her severe pain.  The patient is on Dilaudid but her pain is very poorly controlled at this time.  She was sent in by her hospice nurse due to the fact that her tube seems to be malfunctioning so badly.  She and her daughter give the history.  They both seem to be extremely frustrated with her situation. HPI     Past Medical History:  Diagnosis Date  . Anxiety   . Benign positional vertigo   . Cancer ([place]) 02/09/15   intrahepatic cholangiocarcinoma  . COPD (chronic obstructive pulmonary disease) ([place])   . Depression   . Diabetes mellitus without complication ([place])   . Early cataracts, bilateral  10/13   Optho, Dr Gershon Crane  . Echocardiogram findings abnormal, without diagnosis 10/10   10/10: mild pulm HTN, EF 60-65%, mild LVH, moderate aortic regurg  . GERD (gastroesophageal reflux disease)    I have "acid reflux"  . Gout   . HLD (hyperlipidemia)   . HTN, goal below 130/80   . Irritable bowel syndrome   . Obesity, Class III, BMI 40-49.9 (morbid obesity) ([place])   . Obstructive sleep apnea    wears cpap  . Osteoarthritis (arthritis due to wear and tear of joints)    also gout  . Restrictive lung disease     Patient Active Problem List   Diagnosis Date Noted  . Gastrostomy tube dysfunction ([place]) 04/24/2020  . Pedal edema 02/27/2020  . Palliative care by specialist   . General weakness   . Hospice care patient   . Non-intractable vomiting   . Acute esophagitis   . Cholangiocarcinoma ([place]) 02/15/2020  . Intractable nausea and vomiting 01/14/2020  . Rectal bleeding   . Depression 07/19/2019  . Goals of care, counseling/discussion 06/11/2019  . Blood in stool 06/11/2019  . Acute pain of left lower extremity 01/05/2019  . Gastroesophageal reflux disease without esophagitis 06/03/2018  . Lung nodules 05/04/2018  . Type 2 diabetes mellitus without complication, without long-term current use of insulin ([place]) 01/05/2017  . Healthcare maintenance 01/24/2016  . Back pain 11/23/2015  .  Port catheter in place 11/10/2015  . 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 Cholangiocarcinoma metastatic to liver ([place]) 03/27/2015  . History of resection of liver 02/10/2015  . 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 Inadequate sleep hygiene 08/22/2014  . 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 IBS (irritable bowel syndrome) 03/22/2013  . Liver mass, left lobe 02/24/2013  . Osteoporosis 10/15/2012  . Vitamin D deficiency 12/23/2011  . Cigarette smoker 10/12/2010  . Chronic obstructive pulmonary disease ([place]) 06/14/2010  . Obstructive sleep apnea 04/27/2009  .  OBESITY, UNSPECIFIED 04/26/2009  . Hyperlipidemia 11/25/2007  . GOUT NOS 12/24/2006  . Recurrent major depressive disorder ([place]) 06/05/2006  . Anxiety 06/05/2006  . Tobacco use 06/05/2006  . HYPERTENSION, BENIGN SYSTEMIC 06/05/2006  . Osteoarthritis 06/05/2006  . Generalized anxiety disorder 06/05/2006    Past Surgical History:  Procedure Laterality Date  . ABDOMINAL HYSTERECTOMY    . BIOPSY  02/18/2020   Procedure: BIOPSY;  Surgeon: Thornton Park, MD;  Location: [facility];  Service: Gastroenterology;;  . COLONOSCOPY WITH PROPOFOL N/A 08/12/2019   Procedure: COLONOSCOPY WITH PROPOFOL;  Surgeon: [name], MD;  Location: [facility];  Service: Endoscopy;  Laterality: N/A;  . ESOPHAGOGASTRODUODENOSCOPY N/A 06/15/2015   Procedure: ESOPHAGOGASTRODUODENOSCOPY (EGD);  Surgeon: Gatha Mayer, MD;  Location: [facility];  Service: Endoscopy;  Laterality: N/A;  . ESOPHAGOGASTRODUODENOSCOPY (EGD) WITH PROPOFOL N/A 02/18/2020   Procedure: ESOPHAGOGASTRODUODENOSCOPY (EGD) WITH PROPOFOL;  Surgeon: Thornton Park, MD;  [facility];  Service: Gastroenterology;  Laterality: N/A;  . IR GASTR TUBE CONVERT GASTR-JEJ PER W/FL MOD SED  02/21/2020  . IR GASTR TUBE CONVERT GASTR-JEJ PER W/FL MOD SED  04/24/2020  . IR GASTROSTOMY TUBE MOD SED  02/21/2020  . IR IMAGING GUIDED PORT INSERTION  06/15/2019  . IR INTRO LONG GI TUBE  04/24/2020  . IR REMOVAL TUN ACCESS W/ PORT W/O FL MOD SED  11/11/2016  . LIVER BIOPSY    . OPEN PARTIAL HEPATECTOMY  Left 02/09/15  . PARTIAL HYSTERECTOMY    . ROTATOR CUFF REPAIR       L rotator cuff repair 11/07-[place] - 12/7/200  . TONSILLECTOMY       OB History   No obstetric history on file.     Family History  Problem Relation Age of Onset  . Breast cancer Mother   . Hypertension Mother   . Coronary artery disease Mother   . Diabetes type II Mother   . Rheum arthritis Mother   . Diabetes type II Sister   . Pancreatic cancer Sister      Social History   Tobacco Use  . Smoking status: Former Smoker    Packs/day: 0.50    Years: 49.00    Pack years: 24.50    Types: Cigarettes    Quit date: 04/13/2018    Years since quitting: 2.0  . Smokeless tobacco: Never Used  Vaping Use  . Vaping Use: Never used  Substance Use Topics  . Alcohol use: Yes    Alcohol/week: 0.0 standard drinks    Comment: occasionally  . Drug use: Yes    Types: Marijuana    Home Medications Prior to Admission medications   Medication Sig Start Date End Date Taking? Authorizing Provider  albuterol (PROAIR HFA) 108 (90 Base) MCG/ACT inhaler Inhale 1-2 puffs into the lungs every 6 (six) hours as needed for wheezing or shortness of breath. 07/23/19   Parrett, Fonnie Mu, NP  dexamethasone (DECADRON) 4 MG tablet Take 1 tablet (  4 mg total) by mouth 2 (two) times daily with a meal. 04/13/20   Truitt Merle, MD  dicyclomine (BENTYL) 20 MG tablet Take 1 tablet (20 mg total) by mouth 2 (two) times daily as needed for spasms (abdominal pain). Patient taking differently: Take 20 mg by mouth 2 (two) times daily as needed for spasms. 01/11/20   Gareth Morgan, MD  famotidine (PEPCID) 20 MG tablet Take 1 tablet (20 mg total) by mouth 2 (two) times daily. Patient taking differently: Take 20 mg by mouth 2 (two) times daily as needed for heartburn. 06/26/19   Lattie Haw, MD  fluticasone (FLONASE) 50 MCG/ACT nasal spray Place 2 sprays into both nostrils daily as needed for rhinitis. 04/17/16   Haney, Yetta Flock A, MD  HYDROmorphone (DILAUDID) 2 MG tablet Take 1 tablet (2 mg total) by mouth every 4 (four) hours as needed for severe pain. 02/22/20   Caren Griffins, MD  lisinopril (ZESTRIL) 40 MG tablet Place 1 tablet (40 mg total) into feeding tube daily. 04/30/20   Mariel Aloe, MD  LORazepam (ATIVAN) 0.5 MG tablet Take 0.5 mg by mouth every 8 (eight) hours as needed for anxiety.    [provider]  Nutritional Supplements (FEEDING SUPPLEMENT, OSMOLITE 1.2 CAL,)  LIQD Place 60 mL/hr into feeding tube continuous. 04/30/20   Mariel Aloe, MD  pantoprazole sodium (PROTONIX) 40 mg/20 mL PACK Place 20 mLs (40 mg total) into feeding tube daily. 05/01/20 05/31/20  Mariel Aloe, MD  Water For Irrigation, Sterile (FREE WATER) SOLN Place 100 mLs into feeding tube every 4 (four) hours. 04/30/20   Mariel Aloe, MD    Allergies    Propofol  Review of Systems   Review of Systems Ten systems reviewed and are negative for acute change, except as noted in the HPI.   Physical Exam Updated Vital Signs Temp 97.6 F (36.4 C) (Oral)   Ht 5' 3"  (1.6 m)   Wt 54.3 kg   SpO2 97%   BMI 21.21 kg/m   Physical Exam Vitals and nursing note reviewed.  Constitutional:      General: She is not in acute distress.    Appearance: She is well-developed and well-nourished. She is not diaphoretic.  HENT:     Head: Normocephalic and atraumatic.  Eyes:     General: No scleral icterus.    Conjunctiva/sclera: Conjunctivae normal.  Cardiovascular:     Rate and Rhythm: Normal rate and regular rhythm.     Heart sounds: Normal heart sounds. No murmur heard. No friction rub. No gallop.   Pulmonary:     Effort: Pulmonary effort is normal. No respiratory distress.     Breath sounds: Normal breath sounds.  Abdominal:     General: Bowel sounds are normal. There is no distension.     Palpations: Abdomen is soft. There is no mass.     Tenderness: There is no abdominal tenderness. There is no guarding.    Musculoskeletal:     Cervical back: Normal range of motion.  Skin:    General: Skin is warm and dry.       Neurological:     Mental Status: She is alert and oriented to person, place, and time.  Psychiatric:        Behavior: Behavior normal.     ED Results / Procedures / Treatments   Labs (all labs ordered are listed, but only abnormal results are displayed) Labs Reviewed - No data to display  EKG None  Radiology No results found.  Procedures Procedures    Medications Ordered in ED Medications  lidocaine (XYLOCAINE) 5 % ointment (has no administration in time range)    ED Course  I have reviewed the triage vital signs and the nursing notes.  Pertinent labs & imaging results that were available during my care of the patient were reviewed by me and considered in my medical decision making (see chart for details).  Clinical Course as of 05/05/20 1843  Ludwig Clarks May 05, 2020  1811 75 yo female w/ cholangiocarcinoma, sacral decubitus ulcer, on home hospice, here for GJ tube malfunction.  Here with her daughter at bedside.  Patient had a GJ tube replacement of her former G-tube during hospitalization 1 week ago (discharged on 04/30/20) due to difficult with residuals and gastric emptying.  She was discharged home on liquid augmentin as well for coverage for an aspiration PNA.  She returns today with family frustration that her [place] tube site is leaking, and that there are residuals coming back out of her tubing and she cannot keep in medications.  The patient is passing gas and having 3 loose BM per day.  She has chronic pain which is worsening.  On exam appears thin, malnourished, sacral ulcer noted, she has a GJ tube in place with some leakage around the bumper site, no obvious erythema, bumper around 3-4 cm at the skin.  Pending labs to evaluate hydration status.  DG abdomen with tube injection showed appropriate filling of the stomach and location of GJ tube appropriately per radiologist report.   [MT]  1815 We'll check labs, give her antibiotics through the J portion of the tube. [MT]    Clinical Course User Index [MT] Trifan, Carola Rhine, MD   MDM Rules/Calculators/A&P                          Patient here with GJ tube complaint as well as sacral ulcer.  I have seen the patient and shared visit with Dr. Langston Masker.  I ordered and reviewed labs which shows fairly significant drop in the patient's hemoglobin from 12.36 days ago down to 8.9 today.  Patient's  BMP and mag level without significant abnormality.  I ordered and reviewed a PEG tube study which shows well located [place] tube.  Patient work-up currently still pending.  Have given signout Dr. Langston Masker who will assume care of the patient. Final Clinical Impression(s) / ED Diagnoses Final diagnoses:  PEG (percutaneous endoscopic gastrostomy) adjustment/replacement/removal [facility])    Rx / DC Orders ED Discharge Orders    None       Margarita Mail, PA-C 05/05/20 1847    Wyvonnia Dusky, MD 05/06/20 [phone]

## 2020-05-06 DIAGNOSIS — J449 Chronic obstructive pulmonary disease, unspecified: Secondary | ICD-10-CM

## 2020-05-06 DIAGNOSIS — K922 Gastrointestinal hemorrhage, unspecified: Secondary | ICD-10-CM

## 2020-05-06 DIAGNOSIS — E119 Type 2 diabetes mellitus without complications: Secondary | ICD-10-CM

## 2020-05-06 DIAGNOSIS — K219 Gastro-esophageal reflux disease without esophagitis: Secondary | ICD-10-CM

## 2020-05-06 LAB — CBC
HCT: 25.5 % — ABNORMAL LOW (ref 36.0–46.0)
HCT: 26.2 % — ABNORMAL LOW (ref 36.0–46.0)
HCT: 26.9 % — ABNORMAL LOW (ref 36.0–46.0)
Hemoglobin: 8.3 g/dL — ABNORMAL LOW (ref 12.0–15.0)
Hemoglobin: 8.6 g/dL — ABNORMAL LOW (ref 12.0–15.0)
Hemoglobin: 8.7 g/dL — ABNORMAL LOW (ref 12.0–15.0)
MCH: 31.1 pg (ref 26.0–34.0)
MCH: 30.9 pg (ref 26.0–34.0)
MCH: 30.9 pg (ref 26.0–34.0)
MCHC: 32.5 g/dL (ref 30.0–36.0)
MCHC: 32.8 g/dL (ref 30.0–36.0)
MCHC: 32.3 g/dL (ref 30.0–36.0)
MCV: 95.5 fL (ref 80.0–100.0)
MCV: 94.2 fL (ref 80.0–100.0)
MCV: 95.4 fL (ref 80.0–100.0)
Platelets: 95 10*3/uL — ABNORMAL LOW (ref 150–400)
Platelets: 95 10*3/uL — ABNORMAL LOW (ref 150–400)
Platelets: 108 10*3/uL — ABNORMAL LOW (ref 150–400)
RBC: 2.67 MIL/uL — ABNORMAL LOW (ref 3.87–5.11)
RBC: 2.78 MIL/uL — ABNORMAL LOW (ref 3.87–5.11)
RBC: 2.82 MIL/uL — ABNORMAL LOW (ref 3.87–5.11)
RDW: 16.7 % — ABNORMAL HIGH (ref 11.5–15.5)
RDW: 16.7 % — ABNORMAL HIGH (ref 11.5–15.5)
RDW: 16.7 % — ABNORMAL HIGH (ref 11.5–15.5)
WBC: 4.3 10*3/uL (ref 4.0–10.5)
WBC: 4.4 10*3/uL (ref 4.0–10.5)
WBC: 4.6 10*3/uL (ref 4.0–10.5)
nRBC: 0 % (ref 0.0–0.2)
nRBC: 0 % (ref 0.0–0.2)
nRBC: 0 % (ref 0.0–0.2)

## 2020-05-06 LAB — COMPREHENSIVE METABOLIC PANEL
ALT: 53 U/L — ABNORMAL HIGH (ref 0–44)
AST: 47 U/L — ABNORMAL HIGH (ref 15–41)
Albumin: 2 g/dL — ABNORMAL LOW (ref 3.5–5.0)
Alkaline Phosphatase: 169 U/L — ABNORMAL HIGH (ref 38–126)
Anion gap: 6 (ref 5–15)
BUN: 20 mg/dL (ref 8–23)
CO2: 25 mmol/L (ref 22–32)
Calcium: 8.4 mg/dL — ABNORMAL LOW (ref 8.9–10.3)
Chloride: 109 mmol/L (ref 98–111)
Creatinine, Ser: 0.53 mg/dL (ref 0.44–1.00)
GFR, Estimated: >60 mL/min (ref >60)
Glucose, Bld: 189 mg/dL — ABNORMAL HIGH (ref 70–99)
Potassium: 3.7 mmol/L (ref 3.5–5.1)
Sodium: 140 mmol/L (ref 135–145)
Total Bilirubin: 0.4 mg/dL (ref 0.3–1.2)
Total Protein: 4.6 g/dL — ABNORMAL LOW (ref 6.5–8.1)

## 2020-05-06 MED ORDER — CHLORHEXIDINE GLUCONATE CLOTH 2 % EX PADS
6.0000 | MEDICATED_PAD | Freq: Every day | CUTANEOUS | Status: DC
  Administered 2020-05-06 – 2020-05-10 (×5): 6 via TOPICAL

## 2020-05-06 MED ORDER — LORAZEPAM 0.5 MG PO TABS: 0.5000 mg | ORAL_TABLET | Freq: Three times a day (TID) | ORAL | Status: DC | PRN

## 2020-05-06 MED ORDER — SODIUM CHLORIDE 0.9% FLUSH
10.0000 mL | INTRAVENOUS | Status: DC | PRN
  Administered 2020-05-11: 10 mL

## 2020-05-06 MED ORDER — HYDROMORPHONE HCL 2 MG PO TABS: 2.0000 mg | ORAL_TABLET | ORAL | Status: DC | PRN

## 2020-05-06 MED ORDER — PANTOPRAZOLE SODIUM 40 MG IV SOLR
40.0000 mg | Freq: Two times a day (BID) | INTRAVENOUS | Status: DC
  Administered 2020-05-06 – 2020-05-10 (×11): 40 mg via INTRAVENOUS
  Filled 2020-05-06 (×10): qty 40

## 2020-05-06 MED ORDER — DICYCLOMINE HCL 20 MG PO TABS: 20.0000 mg | ORAL_TABLET | Freq: Two times a day (BID) | ORAL | Status: DC | PRN

## 2020-05-06 MED ORDER — HYDROMORPHONE HCL 1 MG/ML IJ SOLN
0.5000 mg | INTRAMUSCULAR | Status: DC | PRN
  Administered 2020-05-06 – 2020-05-10 (×8): 0.5 mg via INTRAVENOUS
  Filled 2020-05-06 (×8): qty 0.5

## 2020-05-06 MED ORDER — HYDRALAZINE HCL 25 MG PO TABS: 25.0000 mg | ORAL_TABLET | Freq: Three times a day (TID) | ORAL | Status: DC | PRN

## 2020-05-06 NOTE — Consult Note (Signed)
Consult Note for [facility]  Reason for Consult: Malfunctioning GJ tube and GI bleed. Referring Physician: [facility]  Bonnie Peterson HPI: This is a 75 year old female with a PMH of metastatic cholangiocarcinoma, s/p left hepatectomy, LA Grade D esophagitis, GERD, DM, COPD, and s/p GJ tube placement by radiology admitted for [place] tube functioning and anemia.  She was recently discharged from the hospital for a conversion of her G-tube to a GJ-tube.  This was successfully performed by radiology and she was able to tolerate her goal tube feeding at 60 ml/hour.  She started to experience a significant amount of leakage and she was evaluated by radiology again as an outpatient on 05/04/2020.  It was determined that the external bumper was much too loose and this was cinched into position.  The G and J ports were flushed without difficulty, however, she presented to the ER on 05/05/2020 with complaints of a malfunctioning tube as well as dark maroon stools.  In the ER her HGB was 8.9 g/dL and the baseline HGB was 12.3 g/dL.  Past Medical History:  Diagnosis Date  . Anxiety   . Benign positional vertigo   . Cancer ([place]) 02/09/15   intrahepatic cholangiocarcinoma  . COPD (chronic obstructive pulmonary disease) ([place])   . Depression   . Diabetes mellitus without complication ([place])   . Early cataracts, bilateral 10/13   Optho, Dr Gershon Crane  . Echocardiogram findings abnormal, without diagnosis 10/10   10/10: mild pulm HTN, EF 60-65%, mild LVH, moderate aortic regurg  . GERD (gastroesophageal reflux disease)    I have "acid reflux"  . Gout   . HLD (hyperlipidemia)   . HTN, goal below 130/80   . Irritable bowel syndrome   . Obesity, Class III, BMI 40-49.9 (morbid obesity) ([place])   . Obstructive sleep apnea    wears cpap  . Osteoarthritis (arthritis due to wear and tear of joints)    also gout  . Restrictive lung disease     Past Surgical History:  Procedure Laterality Date  . ABDOMINAL  HYSTERECTOMY    . BIOPSY  02/18/2020   Procedure: BIOPSY;  Surgeon: Thornton Park, MD;  Location: [facility];  Service: Gastroenterology;;  . COLONOSCOPY WITH PROPOFOL N/A 08/12/2019   Procedure: COLONOSCOPY WITH PROPOFOL;  Surgeon: [name], MD;  [facility];  Service: Endoscopy;  Laterality: N/A;  . ESOPHAGOGASTRODUODENOSCOPY N/A 06/15/2015   Procedure: ESOPHAGOGASTRODUODENOSCOPY (EGD);  Surgeon: Gatha Mayer, MD;  Location: [facility];  Service: Endoscopy;  Laterality: N/A;  . ESOPHAGOGASTRODUODENOSCOPY (EGD) WITH PROPOFOL N/A 02/18/2020   Procedure: ESOPHAGOGASTRODUODENOSCOPY (EGD) WITH PROPOFOL;  Surgeon: Thornton Park, MD;  [facility];  Service: Gastroenterology;  Laterality: N/A;  . IR GASTR TUBE CONVERT GASTR-JEJ PER W/FL MOD SED  02/21/2020  . IR GASTR TUBE CONVERT GASTR-JEJ PER W/FL MOD SED  04/24/2020  . IR GASTROSTOMY TUBE MOD SED  02/21/2020  . IR IMAGING GUIDED PORT INSERTION  06/15/2019  . IR INTRO LONG GI TUBE  04/24/2020  . IR REMOVAL TUN ACCESS W/ PORT W/O FL MOD SED  11/11/2016  . LIVER BIOPSY    . OPEN PARTIAL HEPATECTOMY  Left 02/09/15  . PARTIAL HYSTERECTOMY    . ROTATOR CUFF REPAIR       L rotator cuff repair 11/07-[place] - 12/7/200  . TONSILLECTOMY      Family History  Problem Relation Age of Onset  . Breast cancer Mother   . Hypertension Mother   . Coronary artery  disease Mother   . Diabetes type II Mother   . Rheum arthritis Mother   . Diabetes type II Sister   . Pancreatic cancer Sister     Social History:  reports that she quit smoking about 2 years ago. Her smoking use included cigarettes. She has a 24.50 pack-year smoking history. She has never used smokeless tobacco. She reports current alcohol use. She reports current drug use. Drug: Marijuana.  Allergies:  Allergies  Allergen Reactions  . Propofol Other (See Comments)    "It makes me feel like I'm on fire."    Medications:  Scheduled: .  amoxicillin-clavulanate  500 mg Per Tube Q8H  . Chlorhexidine Gluconate Cloth  6 each Topical Daily  . ondansetron (ZOFRAN) IV  4 mg Intravenous Once  . pantoprazole (PROTONIX) IV  40 mg Intravenous Q12H   Continuous: . dextrose 5 % and 0.9% NaCl 40 mL/hr at 05/06/20 1143    Results for orders placed or performed during the hospital encounter of 05/05/20 (from the past 24 hour(s))  CBC with Differential     Status: Abnormal   Collection Time: 05/05/20  5:56 PM  Result Value Ref Range   WBC 5.6 4.0 - 10.5 K/uL   RBC 2.88 (L) 3.87 - 5.11 MIL/uL   Hemoglobin 8.9 (L) 12.0 - 15.0 g/dL   HCT 27.0 (L) 36.0 - 46.0 %   MCV 93.8 80.0 - 100.0 fL   MCH 30.9 26.0 - 34.0 pg   MCHC 33.0 30.0 - 36.0 g/dL   RDW 16.5 (H) 11.5 - 15.5 %   Platelets 102 (L) 150 - 400 K/uL   nRBC 0.0 0.0 - 0.2 %   Neutrophils Relative % 84 %   Neutro Abs 4.7 1.7 - 7.7 K/uL   Lymphocytes Relative 11 %   Lymphs Abs 0.6 (L) 0.7 - 4.0 K/uL   Monocytes Relative 5 %   Monocytes Absolute 0.3 0.1 - 1.0 K/uL   Eosinophils Relative 0 %   Eosinophils Absolute 0.0 0.0 - 0.5 K/uL   Basophils Relative 0 %   Basophils Absolute 0.0 0.0 - 0.1 K/uL   Immature Granulocytes 0 %   Abs Immature Granulocytes 0.02 0.00 - 0.07 K/uL  Basic metabolic panel     Status: Abnormal   Collection Time: 05/05/20  5:56 PM  Result Value Ref Range   Sodium 140 135 - 145 mmol/L   Potassium 3.9 3.5 - 5.1 mmol/L   Chloride 106 98 - 111 mmol/L   CO2 24 22 - 32 mmol/L   Glucose, Bld 107 (H) 70 - 99 mg/dL   BUN 18 8 - 23 mg/dL   Creatinine, Ser 0.58 0.44 - 1.00 mg/dL   Calcium 8.8 (L) 8.9 - 10.3 mg/dL   GFR, Estimated >60 >60 mL/min   Anion gap 10 5 - 15  Magnesium     Status: None   Collection Time: 05/05/20  5:56 PM  Result Value Ref Range   Magnesium 2.1 1.7 - 2.4 mg/dL  ABO/Rh     Status: None   Collection Time: 05/05/20  5:56 PM  Result Value Ref Range   ABO/RH(D)      A NEG Performed at [facility] [address] [place], [place] [address]   Type and screen [place]     Status: None   Collection Time: 05/05/20  8:16 PM  Result Value Ref Range   ABO/RH(D) A NEG    Antibody Screen NEG  Sample Expiration      05/08/2020,2359 Performed at [facility], [place] [address] [place], [place] [address]   CBC     Status: Abnormal   Collection Time: 05/05/20  8:21 PM  Result Value Ref Range   WBC 5.5 4.0 - 10.5 K/uL   RBC 2.83 (L) 3.87 - 5.11 MIL/uL   Hemoglobin 8.8 (L) 12.0 - 15.0 g/dL   HCT 26.9 (L) 36.0 - 46.0 %   MCV 95.1 80.0 - 100.0 fL   MCH 31.1 26.0 - 34.0 pg   MCHC 32.7 30.0 - 36.0 g/dL   RDW 16.8 (H) 11.5 - 15.5 %   Platelets 104 (L) 150 - 400 K/uL   nRBC 0.0 0.0 - 0.2 %  SARS Coronavirus 2 by RT PCR (hospital order, performed in [place] hospital lab) Nasopharyngeal Nasopharyngeal Swab     Status: None   Collection Time: 05/05/20 10:05 PM   Specimen: Nasopharyngeal Swab  Result Value Ref Range   SARS Coronavirus 2 NEGATIVE NEGATIVE  Comprehensive metabolic panel     Status: Abnormal   Collection Time: 05/06/20  5:30 AM  Result Value Ref Range   Sodium 140 135 - 145 mmol/L   Potassium 3.7 3.5 - 5.1 mmol/L   Chloride 109 98 - 111 mmol/L   CO2 25 22 - 32 mmol/L   Glucose, Bld 189 (H) 70 - 99 mg/dL   BUN 20 8 - 23 mg/dL   Creatinine, Ser 0.53 0.44 - 1.00 mg/dL   Calcium 8.4 (L) 8.9 - 10.3 mg/dL   Total Protein 4.6 (L) 6.5 - 8.1 g/dL   Albumin 2.0 (L) 3.5 - 5.0 g/dL   AST 47 (H) 15 - 41 U/L   ALT 53 (H) 0 - 44 U/L   Alkaline Phosphatase 169 (H) 38 - 126 U/L   Total Bilirubin 0.4 0.3 - 1.2 mg/dL   GFR, Estimated >60 >60 mL/min   Anion gap 6 5 - 15  CBC     Status: Abnormal   Collection Time: 05/06/20  5:30 AM  Result Value Ref Range   WBC 4.3 4.0 - 10.5 K/uL   RBC 2.67 (L) 3.87 - 5.11 MIL/uL   Hemoglobin 8.3 (L) 12.0 - 15.0 g/dL   HCT 25.5 (L) 36.0 - 46.0 %   MCV 95.5 80.0 - 100.0 fL   MCH 31.1 26.0 - 34.0 pg   MCHC 32.5 30.0 - 36.0  g/dL   RDW 16.7 (H) 11.5 - 15.5 %   Platelets 95 (L) 150 - 400 K/uL   nRBC 0.0 0.0 - 0.2 %  CBC     Status: Abnormal   Collection Time: 05/06/20  7:50 AM  Result Value Ref Range   WBC 4.4 4.0 - 10.5 K/uL   RBC 2.78 (L) 3.87 - 5.11 MIL/uL   Hemoglobin 8.6 (L) 12.0 - 15.0 g/dL   HCT 26.2 (L) 36.0 - 46.0 %   MCV 94.2 80.0 - 100.0 fL   MCH 30.9 26.0 - 34.0 pg   MCHC 32.8 30.0 - 36.0 g/dL   RDW 16.7 (H) 11.5 - 15.5 %   Platelets 95 (L) 150 - 400 K/uL   nRBC 0.0 0.0 - 0.2 %     DG ABDOMEN PEG TUBE LOCATION  Result Date: 05/05/2020 CLINICAL DATA:  Leaking around [place] tube EXAM: ABDOMEN - 1 VIEW COMPARISON:  04/25/2020 FINDINGS: Gastrojejunostomy tube is in place. Contrast was injected through the gastrostomy portion of the of the tube which fills the  stomach. No evidence of contrast extravasation or leak. The jejunostomy portion of the catheter is in the proximal jejunum. No evidence of bowel obstruction. Visualized lung bases clear. IMPRESSION: Gastrojejunostomy appears to be in expected and appropriate position. Injected contrast is in the stomach. Electronically Signed   By: Rolm Baptise M.D.   On: 05/05/2020 17:53    ROS:  As stated above in the HPI otherwise negative.  Blood pressure (!) 168/61, pulse (!) 48, temperature (!) 97.4 F (36.3 C), temperature source Oral, resp. rate 16, height 5' 3"  (1.6 m), weight 54.3 kg, SpO2 99 %.    PE: Gen: NAD, Alert and Oriented HEENT:  [place]/AT, EOMI Neck: Supple, no LAD Lungs: CTA Bilaterally CV: RRR without M/G/R ABD: Soft, NTND, +BS, intact GJ tube Ext: No C/C/E Rectal: scant solid brown stool, no evidence of maroon stool  Assessment/Plan: 1) Anemia. 2) ? Hematochezia. 3) History of LA Grade D esophagitis. 4) Metastatic cholangiocarcinoma.  Currently in hospice. 5) ? Malfunction of the GJ-tube.   The GJ-tube was manipulated, but the proper time for insertion into the ports was not available.  There appeared to be slow drainage.   Radiology will need to reevaluate, but the recent KUB appeared to show the tube was in good position.  As for her GI bleed and anemia, there was no evidence of blood detected with the rectal examination.  She has a history of an LA Grade D esophagitis, but she currently denies any of her prior symptoms associated with the esophagitis.  The patient is currently enrolled in hospice and any intervention such as a colonoscopy is not going to be beneficial.  The patient also adamantly stated that she was not going to undergo a colonoscopy when the subject was broached.  An EGD is not unreasonable, but the true benefit to her is low.  There is a significant drop in her HGB and the source is not clear.  I would favor avoiding any further intervention with her terminal illness.  As her health declines it may not be the cholangiocarcinoma as the sole source of her demise.  Other health issue, such as this GI bleed, will also play a role.  Plan: 1) PPI QD. 2) Radiology to follow up on the GJ-tube. 3) No further GI intervention at this time.  [place] Dalporto D 05/06/2020, 12:25 PM

## 2020-05-06 NOTE — Progress Notes (Signed)
PROGRESS NOTE    Bonnie Peterson  KDX:[id number] DOB: 1945-06-29 DOA: 05/05/2020 PCP: Bonnie Haw, MD    Chief Complaint  Patient presents with  . Leaking peg tube    Brief Narrative:  75 year old lady with prior history of metastatic cholangiocarcinoma s/p left hepatectomy currently on hospice care, esophagitis, GERD, diabetes, COPD, GJ tube by radiology admitted for nonfunctioning/leaking of GJ tube, sacral decubitus ulcer, obstructive sleep apnea admitted for GJ tube malfunction.  She was recently hospitalized and her G-tube was transitioned/converted to Bruin tube.  She was found to have aspiration pneumonia and on liquid Augmentin to complete the course.  Patient also was found to have 3 g hemoglobin drop since last week GI bleed suspected.  GI consulted for further evaluation.  Assessment & Plan:   Principal Problem:   GI bleed Active Problems:   Chronic obstructive pulmonary disease (HCC)   Type 2 diabetes mellitus without complication, without long-term current use of insulin (HCC)   Gastroesophageal reflux disease without esophagitis   Attention to G-tube (HCC)   Cholangiocarcinoma (HCC)   Acute anemia of blood loss of unclear etiology patient's hemoglobin dropped around 3 g in the last 1 week.  Start the patient on IV PPI get stool for occult blood.  GI consulted to see if she is a candidate for any kind of intervention. Transfuse to keep hemoglobin greater than 7.  Monitor hemoglobin every 6 hours.    Severe dysphagia Currently has GJ tube which he is leaking.  IR to evaluate.     Malignant cholangiocarcinoma No available treatment at this time. Currently on hospice care.    Type 2 diabetes mellitus Continue sliding scale insulin.   COPD No wheezing heard on exam continue with bronchodilators as needed.   Thrombocytopenia Unclear etiology, continue to monitor.  Mildly elevated liver enzymes, T bili within normal limits Probably secondary to  cholangiocarcinoma.     DVT prophylaxis: SCD'S Code Status: DNR) Family Communication: (none at bedside.  Disposition:   Status is: Inpatient  Remains inpatient appropriate because:Ongoing diagnostic testing needed not appropriate for outpatient work up and IV treatments appropriate due to intensity of illness or inability to take PO   Dispo: The patient is from: Home              Anticipated d/c is to: pending.              Anticipated d/c date is: 2 days              Patient currently is not medically stable to d/c.   Difficult to place patient No       Consultants:   Gastroenterology.  Palliative.   IR   Procedures: none.    Antimicrobials:  None.    Subjective:  feels puffy.   Objective: Vitals:   05/06/20 0600 05/06/20 0800 05/06/20 1123 05/06/20 1322  BP: (!) 173/55 (!) 148/50 (!) 168/61 (!) 158/53  Pulse: (!) 48 (!) 58 (!) 48 (!) 53  Resp: (!) 22 18 16 16   Temp:   (!) 97.4 F (36.3 C) (!) 97.4 F (36.3 C)  TempSrc:   Oral Oral  SpO2: 100% 100% 99% 100%  Weight:      Height:        Intake/Output Summary (Last 24 hours) at 05/06/2020 1416 Last data filed at 05/06/2020 9767 Gross per 24 hour  Intake 1000 ml  Output --  Net 1000 ml   Filed Weights   05/05/20 1557  Weight:  54.3 kg    Examination:  General exam: Appears calm and comfortable  Respiratory system: Clear to auscultation. Respiratory effort normal. Cardiovascular system: S1 & S2 heard, RRR. No JVD, murmurs. No pedal edema. Gastrointestinal system: Abdomen is soft non tender, GJ tube IN PLACE, Appears to be leaking.. Normal bowel sounds heard. Central nervous system: Alert and oriented. No focal neurological deficits. Extremities: Symmetric 5 x 5 power. Skin: No rashes, lesions or ulcers Psychiatry:  Mood & affect appropriate.     Data Reviewed: I have personally reviewed following labs and imaging studies  CBC: Recent Labs  Lab 05/05/20 1756 05/05/20 2021  05/06/20 0530 05/06/20 0750  WBC 5.6 5.5 4.3 4.4  NEUTROABS 4.7  --   --   --   HGB 8.9* 8.8* 8.3* 8.6*  HCT 27.0* 26.9* 25.5* 26.2*  MCV 93.8 95.1 95.5 94.2  PLT 102* 104* 95* 95*    Basic Metabolic Panel: Recent Labs  Lab 05/05/20 1756 05/06/20 0530  NA 140 140  K 3.9 3.7  CL 106 109  CO2 24 25  GLUCOSE 107* 189*  BUN 18 20  CREATININE 0.58 0.53  CALCIUM 8.8* 8.4*  MG 2.1  --     GFR: Estimated Creatinine Clearance: 51.8 mL/min (by C-G formula based on SCr of 0.53 mg/dL).  Liver Function Tests: Recent Labs  Lab 05/06/20 0530  AST 47*  ALT 53*  ALKPHOS 169*  BILITOT 0.4  PROT 4.6*  ALBUMIN 2.0*    CBG: Recent Labs  Lab 04/29/20 2010 04/30/20 0001 04/30/20 0413 04/30/20 0801 04/30/20 1034  GLUCAP 205* 231* 230* 224* 214*     Recent Results (from the past 240 hour(s))  SARS Coronavirus 2 by RT PCR (hospital order, performed in [facility] hospital lab) Nasopharyngeal Nasopharyngeal Swab     Status: None   Collection Time: 05/05/20 10:05 PM   Specimen: Nasopharyngeal Swab  Result Value Ref Range Status   SARS Coronavirus 2 NEGATIVE NEGATIVE Final    Comment: (NOTE) SARS-CoV-2 target nucleic acids are NOT DETECTED.  The SARS-CoV-2 RNA is generally detectable in upper and lower respiratory specimens during the acute phase of infection. The lowest concentration of SARS-CoV-2 viral copies this assay can detect is 250 copies / mL. A negative result does not preclude SARS-CoV-2 infection and should not be used as the sole basis for treatment or other patient management decisions.  A negative result may occur with improper specimen collection / handling, submission of specimen other than nasopharyngeal swab, presence of viral mutation(s) within the areas targeted by this assay, and inadequate number of viral copies (<250 copies / mL). A negative result must be combined with clinical observations, patient history, and epidemiological information.  Fact  Sheet for Patients:   StrictlyIdeas.no  Fact Sheet for Healthcare Providers: BankingDealers.co.za  This test is not yet approved or  cleared by the [place] FDA and has been authorized for detection and/or diagnosis of SARS-CoV-2 by FDA under an Emergency Use Authorization (EUA).  This EUA will remain in effect (meaning this test can be used) for the duration of the COVID-19 declaration under Section 564(b)(1) of the Act, 21 U.S.C. section 360bbb-3(b)(1), unless the authorization is terminated or revoked sooner.  Performed at [facility], [place] [address] [place], [place] [address]          Radiology Studies: DG ABDOMEN PEG TUBE LOCATION  Result Date: 05/05/2020 CLINICAL DATA:  Leaking around [place] tube EXAM: ABDOMEN - 1 VIEW COMPARISON:  04/25/2020 FINDINGS:  Gastrojejunostomy tube is in place. Contrast was injected through the gastrostomy portion of the of the tube which fills the stomach. No evidence of contrast extravasation or leak. The jejunostomy portion of the catheter is in the proximal jejunum. No evidence of bowel obstruction. Visualized lung bases clear. IMPRESSION: Gastrojejunostomy appears to be in expected and appropriate position. Injected contrast is in the stomach. Electronically Signed   By: Rolm Baptise M.D.   On: 05/05/2020 17:53        Scheduled Meds: . amoxicillin-clavulanate  500 mg Per Tube Q8H  . Chlorhexidine Gluconate Cloth  6 each Topical Daily  . ondansetron (ZOFRAN) IV  4 mg Intravenous Once  . pantoprazole (PROTONIX) IV  40 mg Intravenous Q12H   Continuous Infusions: . dextrose 5 % and 0.9% NaCl 40 mL/hr at 05/06/20 1143     LOS: 1 day       Hosie Poisson, MD [facility]   To contact the attending provider between 7A-7P or the covering provider during after hours 7P-7A, please log into the web site [email or web address] and access using universal [place] password  for that web site. If you do not have the password, please call the hospital operator.  05/06/2020, 2:16 PM

## 2020-05-06 NOTE — Progress Notes (Signed)
[name] [id number] [facility]) Hospitalized Hospice Patient Note  Bonnie Peterson is an [facility] hospice patient admittedto hospice services on10/7/21 with a terminal diagnosis of intrahepatic cholangiocarcinoma. Patient was recently hospitalized and had a g tube placed, she was discharged home and continued to have issues related to drainage and diarrhea. She was evaluated by interventional radiology on Thursday, and found no significant source of her continued issues with her g-tube. She returned home, the drainage worsened and per her daughter and caregiver [name], had become bloody as well. She returned to the ED on 1/28, and is admitted with suspected GI bleed. Hospice was made aware of her going to the ED. This is a related hospital admission per Dr. Konrad Dolores, physician for [facility].  Patient being transferred from the ED to 1311. She is despondent and is feeling poorly again. Daughter [name] was not present. Called her to discuss GOC/next steps. [name] is very frustrated that "this keeps happening". She does not seem to grasp the trajectory of her mothers illness, citing she wants another opinion on why the g-tube keeps malfunctioning. Provided space and actively listened. She seems to understand the fragility of her mother and at the same time does not seem to accept what is truly happening. [name] is overwhelmed and wants the absolute best for her mother and is frustrated that it does not seem to be happening. [name] is struggling with the decline of her mother.   V/S: 97.4 oral, 158/53, HR 53, RR 16, SPO2 100% on RA Labs: glucose 189, alk phos 169, albumin 2.0, AST 47, ALT 53, total protein 4.6, RBC 2.67, hgb 8.3, HCT 25.5, plt 95 IVs/PRNs: protonix 40 mg IV BID, dilaudid 1 mg IV x 1, dilaudid 0.5 mg IV x 1, D5NS @ 40 ml/hr Diagnostics: DG abdomen 1 view - Gastrojejunostomy appears to be in expected and appropriate position. Injected contrast is in the stomach.  Problem List: - Suspected GI  bleed- hgb dropped 3 points over the last week, patient is currently refusing further work up for this, protonix started, serial CBCs, may decide to transfuse if it continues to drop - G-tube dysfunction - per interventional radiology and diagnostics it is functioning appropriately - Thrombocytopenia - this has been her baseline from some time and felt to be secondary to her malignancy - Intrahepatic cholangiocarcinoma - on hospice at home for this  GOC: ongoing, during last hospitalization patient and dtr adamant about not returning to hospital. Continues to have leakage, which is upsetting to the patient. Not clear if [name] understands the trajectory or is just having a hard time coming to terms with this. D/C planning: return home with hospice support IDT: hospice team updated Family: [name] not present, called and discussed with her  Transfer summary to shadow chart.  Thank you, Venia Carbon RN, BSN, [facility] Liaison

## 2020-05-06 NOTE — ED Notes (Signed)
Pt brief changed, dressing on buttocks changed, lidocaine cream applied to area. Repositioned in bed.  Daughter updated on pt condition. Pt offers no complaints at this time.

## 2020-05-06 NOTE — Progress Notes (Signed)
Update given to vivian. All questions answered.

## 2020-05-06 NOTE — ED Notes (Signed)
Report to the floor

## 2020-05-07 DIAGNOSIS — K219 Gastro-esophageal reflux disease without esophagitis: Secondary | ICD-10-CM

## 2020-05-07 DIAGNOSIS — Z7189 Other specified counseling: Secondary | ICD-10-CM

## 2020-05-07 DIAGNOSIS — Z431 Encounter for attention to gastrostomy: Secondary | ICD-10-CM

## 2020-05-07 DIAGNOSIS — E119 Type 2 diabetes mellitus without complications: Secondary | ICD-10-CM

## 2020-05-07 LAB — BASIC METABOLIC PANEL
Anion gap: 6 (ref 5–15)
BUN: 15 mg/dL (ref 8–23)
CO2: 25 mmol/L (ref 22–32)
Calcium: 8.2 mg/dL — ABNORMAL LOW (ref 8.9–10.3)
Chloride: 108 mmol/L (ref 98–111)
Creatinine, Ser: 0.5 mg/dL (ref 0.44–1.00)
GFR, Estimated: >60 mL/min (ref >60)
Glucose, Bld: 129 mg/dL — ABNORMAL HIGH (ref 70–99)
Potassium: 3.1 mmol/L — ABNORMAL LOW (ref 3.5–5.1)
Sodium: 139 mmol/L (ref 135–145)

## 2020-05-07 LAB — GLUCOSE, CAPILLARY
Glucose-Capillary: 107 mg/dL — ABNORMAL HIGH (ref 70–99)
Glucose-Capillary: 129 mg/dL — ABNORMAL HIGH (ref 70–99)
Glucose-Capillary: 128 mg/dL — ABNORMAL HIGH (ref 70–99)

## 2020-05-07 LAB — MAGNESIUM: Magnesium: 1.9 mg/dL (ref 1.7–2.4)

## 2020-05-07 LAB — PHOSPHORUS: Phosphorus: 2.4 mg/dL — ABNORMAL LOW (ref 2.5–4.6)

## 2020-05-07 MED ORDER — POTASSIUM CHLORIDE 10 MEQ/100ML IV SOLN
10.0000 meq | INTRAVENOUS | Status: AC
  Administered 2020-05-07 (×2): 10 meq via INTRAVENOUS
  Filled 2020-05-07: qty 100

## 2020-05-07 MED ORDER — HYDRALAZINE HCL 25 MG PO TABS: 25.0000 mg | ORAL_TABLET | Freq: Three times a day (TID) | ORAL | Status: DC | PRN

## 2020-05-07 MED ORDER — CAMPHOR-MENTHOL 0.5-0.5 % EX LOTN
TOPICAL_LOTION | CUTANEOUS | Status: DC | PRN
  Filled 2020-05-07: qty 222

## 2020-05-07 MED ORDER — LORAZEPAM 0.5 MG PO TABS: 0.5000 mg | ORAL_TABLET | Freq: Three times a day (TID) | ORAL | Status: DC | PRN

## 2020-05-07 MED ORDER — DICYCLOMINE HCL 20 MG PO TABS: 20.0000 mg | ORAL_TABLET | Freq: Two times a day (BID) | ORAL | Status: DC | PRN

## 2020-05-07 MED ORDER — POTASSIUM & SODIUM PHOSPHATES 280-160-250 MG PO PACK
1.0000 | PACK | Freq: Three times a day (TID) | ORAL | Status: AC
  Administered 2020-05-07 – 2020-05-08 (×3): 1
  Filled 2020-05-07 (×3): qty 1

## 2020-05-07 MED ORDER — OSMOLITE 1.2 CAL PO LIQD
1000.0000 mL | ORAL | Status: DC
  Administered 2020-05-07: 1000 mL
  Filled 2020-05-07 (×2): qty 1000

## 2020-05-07 MED ORDER — HYDROMORPHONE HCL 2 MG PO TABS
2.0000 mg | ORAL_TABLET | ORAL | Status: DC | PRN
  Administered 2020-05-09 – 2020-05-11 (×2): 2 mg
  Filled 2020-05-07 (×2): qty 1

## 2020-05-07 NOTE — Progress Notes (Signed)
Bonnie Peterson [id number] [facility]) Hospitalized Hospice Patient Note  Bonnie Peterson is an [facility] hospice patient admittedto hospice services on10/7/21 with a terminal diagnosis of intrahepatic cholangiocarcinoma. Patient was recently hospitalized and had a g tube placed, she was discharged home and continued to have issues related to drainage and diarrhea. She was evaluated by interventional radiology on Thursday, and found no significant source of her continued issues with her g-tube. She returned home, the drainage worsened and per her daughter and caregiver [name], had become bloody as well. She returned to the ED on 1/28, and is admitted with suspected GI bleed. Hospice was made aware of her going to the ED. This is a related hospital admission per Dr. Konrad Dolores, physician for [facility].  Visited patient at bedside. She was alert and oriented, asking to get out of bed and wanting her tube feeds back. She also stated she was ready to eat something by mouth. Pt stated she was also ready to discharge once they figured out what was wrong with her. Spoke with daughter [name] who is very frustrated that "this keeps happening". She does not seem to grasp the trajectory of her mothers illness, citing she wants another opinion on why the g-tube keeps malfunctioning. [name] seems to understand the fragility of her mother and at the same time does not seem to accept what is truly happening. [name] stated she just wants the absolute best for her mom and for her not to suffer.   V/S: 97.8 oral, 151/51, HR 59, RR 15, SPO2 100% on RA Labs: K 3.1, glucose 129, alk phos 169, albumin 2.0, AST 47, ALT 53, total protein 4.6, RBC 2.67, hgb 8.7, HCT 26.9, plt 108 IVs/PRNs: amoxicillin-clavulanate-250-62.35m/500mg per tube. protonix 40 mg IV BID, Dextrose 5% 468m dilaudid 1 mg IV x 1, dilaudid 0.5 mg IV x 1. Diagnostics:  Injected contrast is in the stomach. 1. RIGHT lower lobe airspace disease suspicious for  pneumonia potentially associated with small effusion. 2. Suspected LEFT lower lobe atelectasis versus early infection in this area. 3. Cardiomegaly. 4. Patient's gastrojejunostomy tube is not well seen on the current study.  Problem List: - Acute anemia of blood loss of unclear etiology patient's hemoglobin dropped around 3 g in the last 1 week.  Start the patient on IV PPI get stool for occult blood.  GI consulted to see if she is a candidate for any kind of intervention. Transfuse to keep hemoglobin greater than 7.  Monitor hemoglobin every 6 hours.  Severe dysphagia Currently has GJ tube which he is leaking.  IR to evaluate.  Malignant cholangiocarcinoma No available treatment at this time. Currently on hospice care.  Type 2 diabetes mellitus Continue sliding scale insulin.  COPD No wheezing heard on exam continue with bronchodilators as needed.  Thrombocytopenia Unclear etiology, continue to monitor.  Mildly elevated liver enzymes, T bili within normal limits Probably secondary to cholangiocarcinoma.  GOC: ongoing, during last hospitalization patient and dtr adamant about not returning to hospital. Continues to have leakage, which is upsetting to the patient and [name]. Not clear if RoShirlean Mylarnderstands the trajectory or is just having a hard time coming to terms with this. D/C planning: return home with hospice support IDT: hospice team updated Family: [name] not present, called and discussed with her  MeClementeen HoofBSN, RNAdventhealth [phone]

## 2020-05-07 NOTE — Progress Notes (Signed)
Brief Nutrition Note  Consult received for enteral/tube feeding initiation and management.  Adult Enteral Nutrition Protocol initiated. Full assessment to follow.  Admitting Dx: GI bleed [K92.2] PEG (percutaneous endoscopic gastrostomy) adjustment/replacement/removal (HCC) [Z43.1]  Body mass index is 21.21 kg/m. Pt meets criteria for normal based on current BMI.  Labs:  Recent Labs  Lab 05/05/20 1756 05/06/20 0530 05/07/20 0417  NA 140 140 139  K 3.9 3.7 3.1*  CL 106 109 108  CO2 24 25 25   BUN 18 20 15   CREATININE 0.58 0.53 0.50  CALCIUM 8.8* 8.4* 8.2*  MG 2.1  --   --   GLUCOSE 107* 189* 129*    Berle Fitz RD, LDN Clinical Nutrition Pager listed in [place]

## 2020-05-07 NOTE — Progress Notes (Signed)
Tube feeding started as ordered, patient requested that it be turned off at 1600, because she felt "too full" and could not tolerate it

## 2020-05-07 NOTE — Progress Notes (Signed)
  Asked to evaluate GJ tube due to leakage around the site.  Bumper is secure, no leakage currently, however tube feeds are not going at this time.  Recommend use only J port for tube feeds to minimize leakage.  If continues to be an issue, may need to consider up-sizing.  Shalie Schremp S Zakkiyya Barno PA-C 05/07/2020 12:12 PM

## 2020-05-07 NOTE — Progress Notes (Signed)
PROGRESS NOTE    Bonnie Peterson  NOT:[id number] DOB: [birth year] DOA: 05/05/2020 PCP: Lattie Haw, MD    Chief Complaint  Patient presents with  . Leaking peg tube    Brief Narrative:  75 year old lady with prior history of metastatic cholangiocarcinoma s/p left hepatectomy currently on hospice care, esophagitis, GERD, diabetes, COPD, GJ tube by radiology admitted for nonfunctioning/leaking of GJ tube, sacral decubitus ulcer, obstructive sleep apnea admitted for GJ tube malfunction.  She was recently hospitalized and her G-tube was transitioned/converted to [place] tube.  She was found to have aspiration pneumonia and on liquid Augmentin to complete the course.  Patient also was found to have 3 g hemoglobin drop since last week GI bleed suspected.  GI consulted for further evaluation. Pt refused colonoscopy. Pt seen and examined. Discussed the plan with the daughter.   Assessment & Plan:   Principal Problem:   GI bleed Active Problems:   Chronic obstructive pulmonary disease (HCC)   Type 2 diabetes mellitus without complication, without long-term current use of insulin (HCC)   Gastroesophageal reflux disease without esophagitis   Attention to G-tube (HCC)   Cholangiocarcinoma (HCC)   Acute anemia of blood loss of unclear etiology patient's hemoglobin dropped around 3 g in the last 1 week. Hemoglobin has stabilized around 8.  Started the patient on IV PPI . Stool for occult blood is negative. GI consulted to see if she is a candidate for any kind of intervention.  Pt refused colonoscopy and though an EGD is reasonable, gi suggests the benefit is low. GI recommends avoiding further intervention with her terminal illness.  Transfuse to keep hemoglobin greater than 7. Hemoglobin stabilized around 8.     Severe dysphagia Currently has GJ tube which he is leaking.  IR to evaluate. Pt reports he takes clear liquids at home . Started her on clears at this time.      Malignant  cholangiocarcinoma No available treatment at this time. Currently on hospice care.    Type 2 diabetes mellitus Continue sliding scale insulin. CBG (last 3)  Recent Labs    05/07/20 0745  GLUCAP 107*   No change meds.     COPD No wheezing heard on exam continue with bronchodilators as needed.   Thrombocytopenia Unclear etiology, continue to monitor. Platelets stable .    Mildly elevated liver enzymes, T bili within normal limits Probably secondary to cholangiocarcinoma.  GERD  Stable.      Hypophosphatemia Replaced      DVT prophylaxis: SCD'S Code Status: DNR Family Communication: none at bedside. Discussed the plan with daughter over the phone.  Disposition:   Status is: Inpatient  Remains inpatient appropriate because:Ongoing diagnostic testing needed not appropriate for outpatient work up and IV treatments appropriate due to intensity of illness or inability to take PO   Dispo: The patient is from: Home              Anticipated d/c is to: pending.              Anticipated d/c date is: 2 days              Patient currently is not medically stable to d/c.   Difficult to place patient No   Level of care: Med-Surg     Consultants:   Gastroenterology.  Palliative.   IR   Procedures: none.    Antimicrobials:  None.    Subjective: No new complaints.   Objective: Vitals:   05/06/20 1815 05/06/20  2205 05/07/20 0150 05/07/20 0554  BP: (!) 150/55 (!) 144/52 (!) 152/61 (!) 160/64  Pulse: (!) 59 70 (!) 59 64  Resp:  18 18 18   Temp:  (!) 97.4 F (36.3 C) (!) 97.3 F (36.3 C) 98 F (36.7 C)  TempSrc:  Oral Oral Oral  SpO2: 99% 99% 100% 100%  Weight:      Height:        Intake/Output Summary (Last 24 hours) at 05/07/2020 1413 Last data filed at 05/07/2020 1000 Gross per 24 hour  Intake 1224.67 ml  Output 600 ml  Net 624.67 ml   Filed Weights   05/05/20 1557  Weight: 54.3 kg    Examination:  General exam: alert, not I n  distress.  Respiratory system: Clear to auscultation bilaterally, no wheezing or rhonchi Cardiovascular system: S1-S2 heard, regular rate rhythm, no JVD no pedal edema Gastrointestinal system: Abdomen is soft, nontender, GJ tube in place with a bandage around the ostomy.  Bowel sounds normal Central nervous system: Alert and oriented, grossly nonfocal Extremities: No pedal edema. Skin: No rashes seen Psychiatry: Mood is appropriate    Data Reviewed: I have personally reviewed following labs and imaging studies  CBC: Recent Labs  Lab 05/05/20 1756 05/05/20 2021 05/06/20 0530 05/06/20 0750 05/06/20 1620  WBC 5.6 5.5 4.3 4.4 4.6  NEUTROABS 4.7  --   --   --   --   HGB 8.9* 8.8* 8.3* 8.6* 8.7*  HCT 27.0* 26.9* 25.5* 26.2* 26.9*  MCV 93.8 95.1 95.5 94.2 95.4  PLT 102* 104* 95* 95* 108*    Basic Metabolic Panel: Recent Labs  Lab 05/05/20 1756 05/06/20 0530 05/07/20 0417  NA 140 140 139  K 3.9 3.7 3.1*  CL 106 109 108  CO2 24 25 25   GLUCOSE 107* 189* 129*  BUN 18 20 15   CREATININE 0.58 0.53 0.50  CALCIUM 8.8* 8.4* 8.2*  MG 2.1  --  1.9  PHOS  --   --  2.4*    GFR: Estimated Creatinine Clearance: 51.8 mL/min (by C-G formula based on SCr of 0.5 mg/dL).  Liver Function Tests: Recent Labs  Lab 05/06/20 0530  AST 47*  ALT 53*  ALKPHOS 169*  BILITOT 0.4  PROT 4.6*  ALBUMIN 2.0*    CBG: Recent Labs  Lab 05/07/20 0745  GLUCAP 107*     Recent Results (from the past 240 hour(s))  SARS Coronavirus 2 by RT PCR (hospital order, performed in [facility] hospital lab) Nasopharyngeal Nasopharyngeal Swab     Status: None   Collection Time: 05/05/20 10:05 PM   Specimen: Nasopharyngeal Swab  Result Value Ref Range Status   SARS Coronavirus 2 NEGATIVE NEGATIVE Final    Comment: (NOTE) SARS-CoV-2 target nucleic acids are NOT DETECTED.  The SARS-CoV-2 RNA is generally detectable in upper and lower respiratory specimens during the acute phase of infection. The  lowest concentration of SARS-CoV-2 viral copies this assay can detect is 250 copies / mL. A negative result does not preclude SARS-CoV-2 infection and should not be used as the sole basis for treatment or other patient management decisions.  A negative result may occur with improper specimen collection / handling, submission of specimen other than nasopharyngeal swab, presence of viral mutation(s) within the areas targeted by this assay, and inadequate number of viral copies (<250 copies / mL). A negative result must be combined with clinical observations, patient history, and epidemiological information.  Fact Sheet for Patients:   StrictlyIdeas.no  Fact Sheet  for Healthcare Providers: BankingDealers.co.za  This test is not yet approved or  cleared by the [place] and has been authorized for detection and/or diagnosis of SARS-CoV-2 by FDA under an Emergency Use Authorization (EUA).  This EUA will remain in effect (meaning this test can be used) for the duration of the COVID-19 declaration under Section 564(b)(1) of the Act, 21 U.S.C. section 360bbb-3(b)(1), unless the authorization is terminated or revoked sooner.  Performed at [facility], [place] [address] [place], [place] [address]          Radiology Studies: DG ABDOMEN PEG TUBE LOCATION  Result Date: 05/05/2020 CLINICAL DATA:  Leaking around [place] tube EXAM: ABDOMEN - 1 VIEW COMPARISON:  04/25/2020 FINDINGS: Gastrojejunostomy tube is in place. Contrast was injected through the gastrostomy portion of the of the tube which fills the stomach. No evidence of contrast extravasation or leak. The jejunostomy portion of the catheter is in the proximal jejunum. No evidence of bowel obstruction. Visualized lung bases clear. IMPRESSION: Gastrojejunostomy appears to be in expected and appropriate position. Injected contrast is in the stomach. Electronically Signed    By: Rolm Baptise M.D.   On: 05/05/2020 17:53        Scheduled Meds: . amoxicillin-clavulanate  500 mg Per Tube Q8H  . Chlorhexidine Gluconate Cloth  6 each Topical Daily  . ondansetron (ZOFRAN) IV  4 mg Intravenous Once  . pantoprazole (PROTONIX) IV  40 mg Intravenous Q12H   Continuous Infusions: . dextrose 5 % and 0.9% NaCl 40 mL/hr at 05/06/20 1143  . feeding supplement (OSMOLITE 1.2 CAL)    . potassium chloride       LOS: 2 days       Hosie Poisson, MD [facility]   To contact the attending provider between 7A-7P or the covering provider during after hours 7P-7A, please log into the web site [email or web address] and access using universal [place] password for that web site. If you do not have the password, please call the hospital operator.  05/07/2020, 2:13 PM

## 2020-05-07 NOTE — Consult Note (Signed)
Palliative Medicine Inpatient Consult Note  Reason for consult:  "Poor progression, on home hospice, but daughter wants everything to be done."  HPI:  Per intake H&P 1/28 by Dr. Jonelle Sidle "Bonnie Peterson is a 75 y.o. 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  Patient also has sacral decubitus ulcer.  Daughter brought patient in secondary to [place] tube malfunction.  She recently had the tube replaced while in the hospital last week.  They noted difficulty with residuals as well as gastric emptying.  Patient was taken antibiotics with liquid Augmentin at home.  Suspected to have aspiration pneumonia.  She returned today with a leakage of the tube.  She is also having diarrhea.  Patient also noted to have dropped 3 g hemoglobin since discharge last week.  Gastro intestinal bleed suspected.  GI consulted but patient currently on hospice.  Family still want patient treated.  We are admitting the patient for observation and monitoring her H&H.  Also to have her [place] tube reevaluated by radiology."  Radiology assessed [place] tube today. Bumper was secure and it was not leaking but no feeds were going at that time. Recommendation for J port use only for feeds to minimize leaks. If issue continues, may need to consider up-sizing per Gareth Eagle PA note.   Clinical Assessment/Goals of Care: I have reviewed medical records including EPIC notes, labs and imaging, discussed case with Dr. Karleen Hampshire, received report from bedside RN Llana Aliment, and  assessed the patient.    I met with Bonnie Peterson and later her daughter Bonnie Peterson ([phone])  to further discuss diagnosis prognosis, GOC, EOL wishes, disposition and options. 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 She lives at  home with her daughter, her grandson and daughter's fiance in [place].    I introduced Palliative Medicine as specialized medical care for people living with serious illness. It focuses on providing relief from the symptoms and stress of a serious illness. The goal is to improve quality of life for both the patient and the family.  A detailed discussion was had today regarding advanced directives although Bonnie Peterson is on hospice care at home.  Concepts specific to code status, artifical feeding and hydration, continued IV antibiotics and rehospitalization was had.  The difference between a aggressive medical intervention path  and a palliative comfort care path for this patient at this time was had. Values and goals of care important to patient and family were attempted to be elicited. Bonnie Peterson confirmed her DNR status and said her daughter Bonnie Peterson makes decisions for her. Bonnie Peterson wishes the leaking form her [place] tube to be fixed and to return home. I had an extensive discussion with the daughter who has been providing care at home for her mother since October. She quit her job October 5th to stay home with her mom.  She expressed frustration that her mother is the one who decided to have the G tube placed because the doctors talked to her mother instead of taking to the daughter. She states she would not have given permission but now it is there and there have been issues since it was placed. The constant leaking of  meds, food, blood and stool has caused multiple changes of clothes and use of towels and the daughter is exhausted  with the care. She states hospice is in the home twice a week to help with bathing. I explored if respite care in a SNF would be an option and she declined. She would like more help with care at home and I will ask for this to be explored. She wants everything possible done while her mother is cognizant of what is happening so that there is dignity. She says her mom still has life  in her. In conversation with Bonnie Peterson she is not ready to let her mom go.  She still wants hospice care at home but desires for G-J tube issues to be resolved before her mom comes home. She expressed appreciation for all the care her [facility] has received [facility] now and in the past. She says she knows she is in good hands. I encourage Bonnie Peterson to get rest while her mother is being cared for at [facility].  Symptoms to manage currently include: chronic abdominal pain, anxiety, insomnia, and pruitis.  I discussed the importance of continued conversation with family and their  medical providers regarding overall plan of care and treatment options, ensuring decisions are within the context of the patients values and GOCs.   Decision Maker: Daughter, Bonnie Peterson [phone]   SUMMARY OF RECOMMENDATIONS     Code Status/Advance Care Planning:  DNAR/DNI- allow a natural death  Symptom Management:   Pain: lidocaine for sacral ulcer, hydromorphone prn  Abdominal cramps- bentyl prn  Anxiety- lorazepam 0.5 mg 4 times a day prn  Nausea- Ondansetron 4 mg every 6 hours prn  GERD- pantoprazole   Palliative Prophylaxis:   Dry,  skin- sarna lotion    Psycho-social/Spiritual:   Desire for further Chaplaincy support: Yes, [employer] faith, referral made  Additional Recommendations: Ask transitions of care team to explore additional home health support with hospice   Prognosis: She has terminal cancer, malnutrition, weight loss. Death is not imminent but she is declining.   Discharge Planning: Once ready for discharge, plan for discharge to daughter's home with hospice. They are aware of her admission and following.    Vitals with BMI 05/07/2020 05/07/2020 05/07/2020  Height - - -  Weight - - -  BMI - - -  Systolic 022 336 122  Diastolic 51 64 61  Pulse 59 64 59    PPS: 40%   This conversation/these recommendations were discussed with patient primary care team, Dr. Karleen Hampshire via  phone.  Thank you for the opportunity to participate in the care of this patient and family.   Time In:11:47-1200, 14:30-15:30 Total Time: > 70 minutes Greater than 50%  of this time was spent counseling and coordinating care related to the above assessment and plan.  Lindell Spar, NP  [facility] Palliative Medicine Team Team Cell Phone: [phone] Please utilize secure chat with additional questions, if there is no response within 30 minutes please call the above phone number  Palliative Medicine Team providers are available by phone from 7am to 7pm daily and can be reached through the team cell phone.  Should this patient require assistance outside of these hours, please call the patient's attending physician.

## 2020-05-08 DIAGNOSIS — Z431 Encounter for attention to gastrostomy: Secondary | ICD-10-CM

## 2020-05-08 DIAGNOSIS — C221 Intrahepatic bile duct carcinoma: Secondary | ICD-10-CM | POA: Diagnosis not present

## 2020-05-08 DIAGNOSIS — E440 Moderate protein-calorie malnutrition: Secondary | ICD-10-CM | POA: Insufficient documentation

## 2020-05-08 DIAGNOSIS — K2971 Gastritis, unspecified, with bleeding: Secondary | ICD-10-CM | POA: Diagnosis not present

## 2020-05-08 DIAGNOSIS — K921 Melena: Secondary | ICD-10-CM

## 2020-05-08 LAB — GLUCOSE, CAPILLARY
Glucose-Capillary: 106 mg/dL — ABNORMAL HIGH (ref 70–99)
Glucose-Capillary: 108 mg/dL — ABNORMAL HIGH (ref 70–99)
Glucose-Capillary: 109 mg/dL — ABNORMAL HIGH (ref 70–99)
Glucose-Capillary: 114 mg/dL — ABNORMAL HIGH (ref 70–99)
Glucose-Capillary: 115 mg/dL — ABNORMAL HIGH (ref 70–99)
Glucose-Capillary: 92 mg/dL (ref 70–99)
Glucose-Capillary: 92 mg/dL (ref 70–99)

## 2020-05-08 LAB — BASIC METABOLIC PANEL
Anion gap: 5 (ref 5–15)
BUN: 11 mg/dL (ref 8–23)
CO2: 25 mmol/L (ref 22–32)
Calcium: 8 mg/dL — ABNORMAL LOW (ref 8.9–10.3)
Chloride: 109 mmol/L (ref 98–111)
Creatinine, Ser: 0.51 mg/dL (ref 0.44–1.00)
GFR, Estimated: >60 mL/min (ref >60)
Glucose, Bld: 124 mg/dL — ABNORMAL HIGH (ref 70–99)
Potassium: 3.4 mmol/L — ABNORMAL LOW (ref 3.5–5.1)
Sodium: 139 mmol/L (ref 135–145)

## 2020-05-08 LAB — CBC
HCT: 25.5 % — ABNORMAL LOW (ref 36.0–46.0)
Hemoglobin: 8.2 g/dL — ABNORMAL LOW (ref 12.0–15.0)
MCH: 31.1 pg (ref 26.0–34.0)
MCHC: 32.2 g/dL (ref 30.0–36.0)
MCV: 96.6 fL (ref 80.0–100.0)
Platelets: 101 10*3/uL — ABNORMAL LOW (ref 150–400)
RBC: 2.64 MIL/uL — ABNORMAL LOW (ref 3.87–5.11)
RDW: 17.1 % — ABNORMAL HIGH (ref 11.5–15.5)
WBC: 4.2 10*3/uL (ref 4.0–10.5)
nRBC: 0 % (ref 0.0–0.2)

## 2020-05-08 MED ORDER — OSMOLITE 1.5 CAL PO LIQD
1000.0000 mL | ORAL | Status: DC
  Administered 2020-05-09 – 2020-05-10 (×2): 1000 mL
  Filled 2020-05-08 (×3): qty 1000

## 2020-05-08 MED ORDER — ADULT MULTIVITAMIN LIQUID CH
15.0000 mL | Freq: Every day | ORAL | Status: DC
  Administered 2020-05-09: 15 mL
  Filled 2020-05-08 (×3): qty 15

## 2020-05-08 MED ORDER — OSMOLITE 1.2 CAL PO LIQD
1000.0000 mL | ORAL | Status: DC
  Filled 2020-05-08: qty 1000

## 2020-05-08 NOTE — Progress Notes (Signed)
Initial Nutrition Assessment  DOCUMENTATION CODES:   Non-severe (moderate) malnutrition in context of chronic illness  INTERVENTION:   Once tube feeding is ready to resume: -Initiate Osmolite 1.5 @ 20 ml/hr via J-tube, advance by 10 ml every 6 hours to goal rate of 45 ml/hr. -Provides 1620 kcals, 67g protein and 822 ml H2O. -Recommend free water flushes of 100 ml every 4 hours (600 ml)  -Liquid MVI daily  NUTRITION DIAGNOSIS:   Moderate Malnutrition related to cancer and cancer related treatments,chronic illness as evidenced by percent weight loss,moderate fat depletion,moderate muscle depletion.  GOAL:   Patient will meet greater than or equal to 90% of their needs  MONITOR:   Labs,Weight trends,TF tolerance,I & O's  REASON FOR ASSESSMENT:   Consult Enteral/tube feeding initiation and management  ASSESSMENT:   75 year old lady with prior history of metastatic cholangiocarcinoma s/p left hepatectomy currently on hospice care, esophagitis, GERD, diabetes, COPD, GJ tube by radiology admitted for nonfunctioning/leaking of GJ tube, sacral decubitus ulcer, obstructive sleep apnea admitted for GJ tube malfunction.  Pt and daughter in room. Pt followed by hospice. Pt reports her tube feeding was stopped this morning. States she was tolerating it fine via J-port last night. However, notes from nursing this morning state pt c/o nausea. Pt states that when she gets up her tube leaks. Pt's daughter reports she would like pt to have GI evaluation in the form of a endoscopy/colonoscopy given tube site has blood.   Provided active listening to pt's daughter as she expressed some frustrations with her mother's course and how she just wants her mother to be able to eat and not feel hungry. Very adamant about continuing all care she can. States she was having a hard time keeping pt clean when the tube would leak.   Current plan is to feed via pt's J-port and medications provided via G-port.    Pt has c/o feeling full with tube feeds. Will switch formula to more concentrated formula so goal rate will be lower.   Suspect pt has not been receiving 100% of nutrition lately as pt's daughter has struggled to provide the pt 24 hr feeds. States she will stop the infusion if her mother feels full instead of just lowering the rate.   Plan discussed with pt and pt's daughter. Will continue to monitor plan from other disciplines.  Per weight records, pt has lost 50 lbs since 09/30/19 (29% wt loss x 7 months, significant for time frame).  Medications: D5 infusion, IV Zofran  Labs reviewed: CBGs: 92-129 Low K  NUTRITION - FOCUSED PHYSICAL EXAM:  Flowsheet Row Most Recent Value  Orbital Region Moderate depletion  Upper Arm Region Moderate depletion  Thoracic and Lumbar Region Unable to assess  Buccal Region Mild depletion  Temple Region Moderate depletion  Clavicle Bone Region Moderate depletion  Clavicle and Acromion Bone Region Moderate depletion  Scapular Bone Region Unable to assess  Dorsal Hand Mild depletion  Patellar Region Unable to assess  Anterior Thigh Region Unable to assess  Posterior Calf Region Unable to assess       Diet Order:   Diet Order            Diet NPO time specified  Diet effective now                 EDUCATION NEEDS:   Education needs have been addressed  Skin:  Skin Assessment: Reviewed RN Assessment  Last BM:  1/30  Height:   Ht Readings from  Last 1 Encounters:  05/05/20 5' 3"  (1.6 m)    Weight:   Wt Readings from Last 1 Encounters:  05/05/20 54.3 kg   BMI:  Body mass index is 21.21 kg/m.  Estimated Nutritional Needs:   Kcal:  1650-1850  Protein:  75-90g  Fluid:  2L/day  Clayton Bibles, MS, RD, LDN Inpatient Clinical Dietitian Contact information available via Amion

## 2020-05-08 NOTE — H&P (View-Only) (Signed)
Progress Note  Chief Complaint:    anemia    Attending physician's note   I have taken an interval history, reviewed the chart and examined the patient. I agree with the Advanced Practitioner's note, impression and recommendations.   75 year old female with metastatic cholangiocarcinoma, admitted with gastrojejunal tube malfunction. Patient and her daughter are concerned about continuous leakage from around the gastrojejunal tube.  Called her daughter and discussed her concerns in detail  According to daughter likes to eat rather than have tube feeds, when she eats she has significant leakage around the tube and also vomits  She has significant tumor burden with involvement of liver, porta hepatis and portal vein.  She was noted to have gastropathy during most recent EGD in November 2021  CT showed circumferential mural thickening and edema involving distal body of stomach and antrum in November 2021  Presentation is concerning for worsening gastric outlet obstruction likely secondary to tumor invasion  Decline in hemoglobin from baseline of 10 to 12--->8 on recent admission Patient has been having dark melenic appearing stool for past few months  Explained to patient's daughter that therapeutic options are limited especially if it is related to tumor invasion but we can offer diagnostic EGD if she wants to pursue further evaluation of anemia The risks and benefits as well as alternatives of endoscopic procedure(s) have been discussed and reviewed. All questions answered. The patient agrees to proceed.  N.p.o. after midnight  I have spent 35 minutes of patient care (this includes precharting, chart review, review of results, face-to-face time used for counseling as well as treatment plan and follow-up. The patient was provided an opportunity to ask questions and all were answered. The patient agreed with the plan and demonstrated an understanding of the instructions.  Damaris Hippo , MD [phone]     ASSESSMENT / PLAN:    # 75 yo female with PMH significant for metastatic cholangiocarcinoma s/p left hepatectomy,  COPD, HTN, DM, OSA, eophagitis, . Admitted due to Lithium malfunction a few days ago. Noted to have had a decline in hgb. Patient has been on hospice but daughter concerned about the anemia. Patient evaluated by Dr. Benson Norway covering for [place] over the weekend, refer to that consult note.   # [place] anemia, likely multifactorial. Hgb has declined from 12 to 8.2 over last several days. Patient reports black stools. Evaluated over weekend by Dr. Benson Norway. No evidence for overt GI bleeding on his exam . Performing an EGD seemed low yield, GI signed off case. We were called back today.  Daughter [name] concerned about anemia. Dr. Silverio Decamp spoke to [name] and decision was made to proceed with an EGD tomorrow. Of note,  No polyps or cancers on EGD in May 2021. EGD in November 2021 remarkable for esophagitis and reactive gastropathy 0 though I don't think anemia was an issue at that time). She may have developed an ulcer in interim.  With metastatic cholangiocarcinomas also consider varices / portal gastropathy.      # GJ tube. Patient and daughter concerned about leakage around site. IR evaluated, no leakage at the time but TF were note in progress. They recommended using on J port for feedings and if persistent leakage then considering upsizing tube.      SUBJECTIVE:   Concerned about GJ tube eaking.     PREVIOUS ENDOSCOPIES / GI EVALUATION    Nov 2021 EGD for nausea / vomiting.  LA Grade D (one or more mucosal breaks  involving at least 75% of esophageal circumference) esophagitis with no bleeding was found from 28 to 34 cm from the incisors. Biopsies were taken with a cold forceps for histology. Estimated blood loss was minimal. Findings: Diffuse mildly erythematous mucosa without bleeding was found in the gastric fundus, in the gastric body and in the gastric antrum. In the  antrum, the mucosa has a verrucous appearance. Biopsies were taken with a cold forceps for histology. Estimated blood loss was minimal. 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 The gastroscope can advance through the pylorus without resistance, but, the path is circuitous.  STOMACH, BIOPSY:  - Reactive gastropathy  - No H. pylori or intestinal metaplasia identified  - See comment   B. ESOPHAGUS, BIOPSY:  - Reactive squamous mucosa with ulceration and focal acute inflammation  - No increased intraepithelial eosinophils  - See comment     May 2021 Colonoscopy  --complete exam, good prep -There were several lipomas spread throughout the colon. The largest was in the sigmoid (pedunculated 2-3cm, slightly inflamed at the tip). This was unchanged from 2015, the previous submucosal tattoo was still evident. - Internal hemorrhoids (this is likely the cause of your minor rectal bleeding). - Diverticulosis in the left colon. - The examination was otherwise normal on direct and retroflexion views. - No polyps or cancers.   Scheduled inpatient medications:  . amoxicillin-clavulanate  500 mg Per Tube Q8H  . Chlorhexidine Gluconate Cloth  6 each Topical Daily  . feeding supplement (OSMOLITE 1.5 CAL)  1,000 mL Per Tube Q24H  . multivitamin  15 mL Per Tube Daily  . ondansetron (ZOFRAN) IV  4 mg Intravenous Once  . pantoprazole (PROTONIX) IV  40 mg Intravenous Q12H   Continuous inpatient infusions:  . dextrose 5 % and 0.9% NaCl 40 mL/hr at 05/07/20 2045   PRN inpatient medications: camphor-menthol, dicyclomine, hydrALAZINE, HYDROmorphone (DILAUDID) injection, HYDROmorphone, lidocaine, LORazepam, ondansetron **OR** ondansetron (ZOFRAN) IV, sodium chloride flush  Vital signs in last 24 hours: Temp:  [97.8 F (36.6 C)-98.1 F (36.7 C)] 98.1 F (36.7 C) (01/31 1408) Pulse Rate:  [57-61] 61 (01/31 1408) Resp:  [14-17] 14 (01/31 1408) BP: (133-145)/(52-71)  133/52 (01/31 1408) SpO2:  [100 %] 100 % (01/31 1408) Last BM Date: 05/07/20  Intake/Output Summary (Last 24 hours) at 05/08/2020 1733 Last data filed at 05/08/2020 1149 Gross per 24 hour  Intake 816.17 ml  Output 400 ml  Net 416.17 ml     Physical Exam:  . General: Alert female in NAD . Heart:  Regular rate and rhythm. No lower extremity edema . Pulmonary: Normal respiratory effort . Abdomen: Soft, nondistended, nontender. Normal bowel sounds.  . Neurologic: Alert and oriented . Psych: Pleasant. Cooperative.   Filed Weights   05/05/20 1557  Weight: 54.3 kg    Intake/Output from previous day: 01/30 0701 - 01/31 0700 In: 876.2 [P.O.:120; I.V.:556.2; IV Piggyback:200] Out: 300 [Urine:300] Intake/Output this shift: Total I/O In: -  Out: 100 [Urine:100]    Lab Results: Recent Labs    05/06/20 0750 05/06/20 1620 05/08/20 0316  WBC 4.4 4.6 4.2  HGB 8.6* 8.7* 8.2*  HCT 26.2* 26.9* 25.5*  PLT 95* 108* 101*   BMET Recent Labs    05/06/20 0530 05/07/20 0417 05/08/20 0316  NA 140 139 139  K 3.7 3.1* 3.4*  CL 109 108 109  CO2 25 25 25   GLUCOSE 189* 129* 124*  BUN 20 15 11   CREATININE 0.53 0.50 0.51  CALCIUM  8.4* 8.2* 8.0*   LFT Recent Labs    05/06/20 0530  PROT 4.6*  ALBUMIN 2.0*  AST 47*  ALT 53*  ALKPHOS 169*  BILITOT 0.4   PT/INR No results for input(s): LABPROT, INR in the last 72 hours. Hepatitis Panel No results for input(s): HEPBSAG, HCVAB, HEPAIGM, HEPBIGM in the last 72 hours.  No results found.    Principal Problem:   GI bleed Active Problems:   Chronic obstructive pulmonary disease (HCC)   Type 2 diabetes mellitus without complication, without long-term current use of insulin (HCC)   Gastroesophageal reflux disease without esophagitis   Attention to G-tube (HCC)   Cholangiocarcinoma (HCC)   Malnutrition of moderate degree     LOS: 3 days   Tye Savoy ,NP 05/08/2020, 5:33 PM

## 2020-05-08 NOTE — Consult Note (Signed)
WOC Nurse Consult Note: Reason for Consult:stage 3 pressure injury sacrum Wound type:pressure Pressure Injury POA: Yes Measurement: 3 cm  X 3 cm x 0.3 cm  Wound ENM:MHWKG red Drainage (amount, consistency, odor) minimal serosanguinous   Periwound:intact  Painful  Lidocaine gel as needed for pain ordered  Dressing procedure/placement/frequency:Cleanse sacral wound with NS.  Apply lidocaine gel for pain relief.  Fill dead space with alginate (LAWSON # [id number])  Cover with silicone foam.  Peel back foam to reapply gel.  Change foam and alginate every other day.  Will not follow at this time.  Please re-consult if needed.  Domenic Moras MSN, RN, FNP-BC CWON Wound, Ostomy, Continence Nurse Pager [phone]

## 2020-05-08 NOTE — Progress Notes (Signed)
PROGRESS NOTE    Bonnie Peterson  GLO:[id number] DOB: 1946-01-17 DOA: 05/05/2020 PCP: Lattie Haw, MD    Chief Complaint  Patient presents with  . Leaking peg tube    Brief Narrative:  75 year old lady with prior history of metastatic cholangiocarcinoma s/p left hepatectomy currently on hospice care, esophagitis, GERD, diabetes, COPD, GJ tube by radiology admitted for nonfunctioning/leaking of GJ tube, sacral decubitus ulcer, obstructive sleep apnea admitted for GJ tube malfunction.  She was recently hospitalized and her G-tube was transitioned/converted to Simpson tube.  She was found to have aspiration pneumonia and on liquid Augmentin to complete the course.  Patient also was found to have 3 g hemoglobin drop since last week GI bleed suspected.  GI consulted for further evaluation.   Assessment & Plan:   Principal Problem:   GI bleed Active Problems:   Chronic obstructive pulmonary disease (HCC)   Type 2 diabetes mellitus without complication, without long-term current use of insulin (HCC)   Gastroesophageal reflux disease without esophagitis   Attention to G-tube (HCC)   Cholangiocarcinoma (HCC)   Malnutrition of moderate degree   Acute anemia of blood loss of unclear etiology patient's hemoglobin dropped around 3 g in the last 1 week. Hemoglobin has stabilized around 8.  Started the patient on IV PPI . GI consulted to see if she is a candidate for any kind of intervention.  As pre Dr Benson Norway, pt refused colonoscopy and though an EGD is reasonable, gi suggests the benefit is low. GI recommends avoiding further intervention with her terminal illness. Daughter is aware and would like to pursue the colonoscopy and EGD.  Transfuse to keep hemoglobin greater than 7. Hemoglobin stabilized around 8. No obvious signs of bleeding.  Suspect her anemia is a combination of nutritional deficiencies, gi bleed and malignancy.     Severe dysphagia Currently has GJ tube which he is leaking.  IR to  evaluate. Pt reports he takes clear liquids at home . Started her on clears at this time.      Malignant cholangiocarcinoma No available treatment at this time. Currently on hospice care.    Type 2 diabetes mellitus Continue sliding scale insulin. CBG (last 3)  Recent Labs    05/08/20 0810 05/08/20 1146 05/08/20 1545  GLUCAP 92 106* 92   No change meds.  onclear liquid diet.     COPD No wheezing heard.    Thrombocytopenia Unclear etiology, possibly from the cholangiocarcinoma.  continue to monitor. Platelets stable  And >100,000   Mildly elevated liver enzymes, T bili within normal limits Probably secondary to cholangiocarcinoma.  GERD  Stable.      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       DVT prophylaxis: SCD'S Code Status: DNR Family Communication: none at bedside. Discussed the plan with daughter over the phone.  Disposition:   Status is: Inpatient  Remains inpatient appropriate because:Ongoing diagnostic testing needed not appropriate for outpatient work up and IV treatments appropriate due to intensity of illness or inability to take PO   Dispo: The patient is from: Home              Anticipated d/c is to: pending.              Anticipated d/c date is: 2 days              Patient currently is not medically stable to d/c.   Difficult  to place patient No   Level of care: Med-Surg     Consultants:   Gastroenterology.  Palliative.   IR   Procedures: none.    Antimicrobials:  None.    Subjective: Reports feeling full and does not want tube feeds.  Objective: Vitals:   05/07/20 1409 05/07/20 2125 05/08/20 0610 05/08/20 1408  BP: (!) 151/51 (!) 145/62 137/71 (!) 133/52  Pulse: (!) 59 (!) 57 60 61  Resp: 15 17 16 14   Temp: 97.8 F (36.6 C) 97.9 F (36.6 C) 97.8 F (36.6 C) 98.1 F (36.7 C)  TempSrc: Oral Oral Oral Oral  SpO2: 100% 100% 100% 100%  Weight:       Height:        Intake/Output Summary (Last 24 hours) at 05/08/2020 1701 Last data filed at 05/08/2020 1149 Gross per 24 hour  Intake 816.17 ml  Output 400 ml  Net 416.17 ml   Filed Weights   05/05/20 1557  Weight: 54.3 kg    Examination:  General exam: alert and comfortable.  Respiratory system: air entry fair, no wheezing or rhonchi.  Cardiovascular system: S1S2, [place], RRR no JVD, no pedal edema.  Gastrointestinal system: Abdomen is soft, NT ND , GJ tube in place.  Central nervous system: Alert and oriented, non focal.  Extremities: no cyanosis or clubbing.  Skin: stage 3 pressure ulcer on the sacrum.  Psychiatry: Mood is appropriate    Data Reviewed: I have personally reviewed following labs and imaging studies  CBC: Recent Labs  Lab 05/05/20 1756 05/05/20 2021 05/06/20 0530 05/06/20 0750 05/06/20 1620 05/08/20 0316  WBC 5.6 5.5 4.3 4.4 4.6 4.2  NEUTROABS 4.7  --   --   --   --   --   HGB 8.9* 8.8* 8.3* 8.6* 8.7* 8.2*  HCT 27.0* 26.9* 25.5* 26.2* 26.9* 25.5*  MCV 93.8 95.1 95.5 94.2 95.4 96.6  PLT 102* 104* 95* 95* 108* 101*    Basic Metabolic Panel: Recent Labs  Lab 05/05/20 1756 05/06/20 0530 05/07/20 0417 05/08/20 0316  NA 140 140 139 139  K 3.9 3.7 3.1* 3.4*  CL 106 109 108 109  CO2 24 25 25 25   GLUCOSE 107* 189* 129* 124*  BUN 18 20 15 11   CREATININE 0.58 0.53 0.50 0.51  CALCIUM 8.8* 8.4* 8.2* 8.0*  MG 2.1  --  1.9  --   PHOS  --   --  2.4*  --     GFR: Estimated Creatinine Clearance: 51.8 mL/min (by C-G formula based on SCr of 0.51 mg/dL).  Liver Function Tests: Recent Labs  Lab 05/06/20 0530  AST 47*  ALT 53*  ALKPHOS 169*  BILITOT 0.4  PROT 4.6*  ALBUMIN 2.0*    CBG: Recent Labs  Lab 05/08/20 0043 05/08/20 0401 05/08/20 0810 05/08/20 1146 05/08/20 1545  GLUCAP 114* 109* 92 106* 92     Recent Results (from the past 240 hour(s))  SARS Coronavirus 2 by RT PCR (hospital order, performed in [facility] hospital  lab) Nasopharyngeal Nasopharyngeal Swab     Status: None   Collection Time: 05/05/20 10:05 PM   Specimen: Nasopharyngeal Swab  Result Value Ref Range Status   SARS Coronavirus 2 NEGATIVE NEGATIVE Final    Comment: (NOTE) SARS-CoV-2 target nucleic acids are NOT DETECTED.  The SARS-CoV-2 RNA is generally detectable in upper and lower respiratory specimens during the acute phase of infection. The lowest concentration of SARS-CoV-2 viral copies this assay can detect is 250 copies /  mL. A negative result does not preclude SARS-CoV-2 infection and should not be used as the sole basis for treatment or other patient management decisions.  A negative result may occur with improper specimen collection / handling, submission of specimen other than nasopharyngeal swab, presence of viral mutation(s) within the areas targeted by this assay, and inadequate number of viral copies (<250 copies / mL). A negative result must be combined with clinical observations, patient history, and epidemiological information.  Fact Sheet for Patients:   StrictlyIdeas.no  Fact Sheet for Healthcare Providers: BankingDealers.co.za  This test is not yet approved or  cleared by the [place] FDA and has been authorized for detection and/or diagnosis of SARS-CoV-2 by FDA under an Emergency Use Authorization (EUA).  This EUA will remain in effect (meaning this test can be used) for the duration of the COVID-19 declaration under Section 564(b)(1) of the Act, 21 U.S.C. section 360bbb-3(b)(1), unless the authorization is terminated or revoked sooner.  Performed at [facility], [place] [address] [place],  [address]          Radiology Studies: No results found.      Scheduled Meds: . amoxicillin-clavulanate  500 mg Per Tube Q8H  . Chlorhexidine Gluconate Cloth  6 each Topical Daily  . feeding supplement (OSMOLITE 1.5 CAL)  1,000 mL  Per Tube Q24H  . multivitamin  15 mL Per Tube Daily  . ondansetron (ZOFRAN) IV  4 mg Intravenous Once  . pantoprazole (PROTONIX) IV  40 mg Intravenous Q12H   Continuous Infusions: . dextrose 5 % and 0.9% NaCl 40 mL/hr at 05/07/20 2045     LOS: 3 days       Hosie Poisson, MD [facility]   To contact the attending provider between 7A-7P or the covering provider during after hours 7P-7A, please log into the web site [email or web address] and access using universal [place] password for that web site. If you do not have the password, please call the hospital operator.  05/08/2020, 5:01 PM

## 2020-05-08 NOTE — Progress Notes (Signed)
Patient refused to restart her tube feeding.patient said that she felt full and c/o nausea. PRN Zofran administered x 1.

## 2020-05-08 NOTE — Progress Notes (Signed)
[facility]) Hospitalized Hospice Patient Note  Bonnie Peterson is an [facility] hospice patient admittedto hospice services on10/7/21 with a terminal diagnosis of intrahepatic cholangiocarcinoma.Patient was recently hospitalized and had a g tube placed, she was discharged home and continued to have issues related to drainage and diarrhea. She was evaluated by interventional radiology on Thursday, and found no significant source of her continued issues with her g-tube. She returned home, the drainage worsened and per her daughter and caregiver Bonnie Peterson, had become bloody as well. She returned to the ED on 1/28, and is admitted with suspected GI bleed. Hospice was made aware of her going to the ED. This is a related hospital admission per Dr. Konrad Peterson, physician for [facility]  Visited patient at the bedside, daughter Bonnie Peterson in the room. Patient sitting in recliner and states that she feels "ok". Bonnie Peterson voices much concern with feeling like no one is listening to them. She states that although she has requested that any decisions and communication be made through her, that decisions are still being left up to her mom--which she states does not fully understand what is going on due to receiving pain medicine. Bonnie Peterson states that the ED MD stated Bonnie Peterson should have a colonoscopy/endoscopy to determine why her hemoglobin has dropped. Bonnie Peterson states that Bonnie Peterson initially denied this in the ED--due to her being on opiates--and now Bonnie Peterson would like GI follow up to help determine source of blood loss. They state that staff continue to ask them "why they are here if they are under hospice services" and are considering stopping hospice services so "the staff will treat mom". They are very frustrated and Bonnie Peterson began to cry as she feels that no one is addressing her issues with her G-tube. Provided space to vent, provided support and active listening.   V/S: 97.8 oral, 137/71, HR 60, RR 16,  SPO2 100% on RA I&O: 872/300 Labs: RBC 2.84, hgb 8.2, HCT 25.5, plt 101 IVs/PRNs: D5NS @ 40 ml/hr  Problem List: - Suspected GI bleed- hgb dropped 3 points over the last week, initially patient refused further work up for this in the ED, however now would like GI to consult to help determine source of bleeding, protonix started, serial CBCs, may decide to transfuse if hgb continues to drop - G-tube dysfunction - per interventional radiology and diagnostics it is functioning appropriately, continues to leak when enteral nutrition is started per patient. Assessed site, small amount of blood noted to site.  - Thrombocytopenia - this has been her baseline from some time and felt to be secondary to her malignancy, was 89 > 101 today - Intrahepatic cholangiocarcinoma - on hospice at home for this, however they are discussing stopping hospice services as they feel that they are not being treated due to "being with hospice"  GOC: ongoing, during last hospitalization patient and dtr adamant about not returning to hospital. Continues to have leakage, which is upsetting to the patient. Bonnie Peterson understands that her mother is nearing the end of her life. She does want her to be treated for her leaking G-tube and feels this is a reasonable request.  D/C planning: return home with hospice support IDT: hospice team updated Family: Present in room  Bonnie Peterson, BSN, [facility] Liaison

## 2020-05-08 NOTE — Progress Notes (Addendum)
Progress Note  Chief Complaint:    anemia    Attending physician's note   I have taken an interval history, reviewed the chart and examined the patient. I agree with the Advanced Practitioner's note, impression and recommendations.   75 year old female with metastatic cholangiocarcinoma, admitted with gastrojejunal tube malfunction. Patient and her daughter are concerned about continuous leakage from around the gastrojejunal tube.  Called her daughter and discussed her concerns in detail  According to daughter likes to eat rather than have tube feeds, when she eats she has significant leakage around the tube and also vomits  She has significant tumor burden with involvement of liver, porta hepatis and portal vein.  She was noted to have gastropathy during most recent EGD in November 2021  CT showed circumferential mural thickening and edema involving distal body of stomach and antrum in November 2021  Presentation is concerning for worsening gastric outlet obstruction likely secondary to tumor invasion  Decline in hemoglobin from baseline of 10 to 12--->8 on recent admission Patient has been having dark melenic appearing stool for past few months  Explained to patient's daughter that therapeutic options are limited especially if it is related to tumor invasion but we can offer diagnostic EGD if she wants to pursue further evaluation of anemia The risks and benefits as well as alternatives of endoscopic procedure(s) have been discussed and reviewed. All questions answered. The patient agrees to proceed.  N.p.o. after midnight  I have spent 35 minutes of patient care (this includes precharting, chart review, review of results, face-to-face time used for counseling as well as treatment plan and follow-up. The patient was provided an opportunity to ask questions and all were answered. The patient agreed with the plan and demonstrated an understanding of the instructions.  Damaris Hippo , MD [phone]     ASSESSMENT / PLAN:    # 75 yo female with PMH significant for metastatic cholangiocarcinoma s/p left hepatectomy,  COPD, HTN, DM, OSA, eophagitis, . Admitted due to [place] malfunction a few days ago. Noted to have had a decline in hgb. Patient has been on hospice but daughter concerned about the anemia. Patient evaluated by Dr. Benson Norway covering for [place] over the weekend, refer to that consult note.   # [place] anemia, likely multifactorial. Hgb has declined from 12 to 8.2 over last several days. Patient reports black stools. Evaluated over weekend by Dr. Benson Norway. No evidence for overt GI bleeding on his exam . Performing an EGD seemed low yield, GI signed off case. We were called back today.  Daughter [name] concerned about anemia. Dr. Silverio Decamp spoke to [name] and decision was made to proceed with an EGD tomorrow. Of note,  No polyps or cancers on EGD in May 2021. EGD in November 2021 remarkable for esophagitis and reactive gastropathy 0 though I don't think anemia was an issue at that time). She may have developed an ulcer in interim.  With metastatic cholangiocarcinomas also consider varices / portal gastropathy.      # GJ tube. Patient and daughter concerned about leakage around site. IR evaluated, no leakage at the time but TF were note in progress. They recommended using on J port for feedings and if persistent leakage then considering upsizing tube.      SUBJECTIVE:   Concerned about GJ tube eaking.     PREVIOUS ENDOSCOPIES / GI EVALUATION    Nov 2021 EGD for nausea / vomiting.  LA Grade D (one or more mucosal breaks  involving at least 75% of esophageal circumference) esophagitis with no bleeding was found from 28 to 34 cm from the incisors. Biopsies were taken with a cold forceps for histology. Estimated blood loss was minimal. Findings: Diffuse mildly erythematous mucosa without bleeding was found in the gastric fundus, in the gastric body and in the gastric antrum. In the  antrum, the mucosa has a verrucous appearance. Biopsies were taken with a cold forceps for histology. Estimated blood loss was minimal. 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 The gastroscope can advance through the pylorus without resistance, but, the path is circuitous.  STOMACH, BIOPSY:  - Reactive gastropathy  - No H. pylori or intestinal metaplasia identified  - See comment   B. ESOPHAGUS, BIOPSY:  - Reactive squamous mucosa with ulceration and focal acute inflammation  - No increased intraepithelial eosinophils  - See comment     May 2021 Colonoscopy  --complete exam, good prep -There were several lipomas spread throughout the colon. The largest was in the sigmoid (pedunculated 2-3cm, slightly inflamed at the tip). This was unchanged from 2015, the previous submucosal tattoo was still evident. - Internal hemorrhoids (this is likely the cause of your minor rectal bleeding). - Diverticulosis in the left colon. - The examination was otherwise normal on direct and retroflexion views. - No polyps or cancers.   Scheduled inpatient medications:  . amoxicillin-clavulanate  500 mg Per Tube Q8H  . Chlorhexidine Gluconate Cloth  6 each Topical Daily  . feeding supplement (OSMOLITE 1.5 CAL)  1,000 mL Per Tube Q24H  . multivitamin  15 mL Per Tube Daily  . ondansetron (ZOFRAN) IV  4 mg Intravenous Once  . pantoprazole (PROTONIX) IV  40 mg Intravenous Q12H   Continuous inpatient infusions:  . dextrose 5 % and 0.9% NaCl 40 mL/hr at 05/07/20 2045   PRN inpatient medications: camphor-menthol, dicyclomine, hydrALAZINE, HYDROmorphone (DILAUDID) injection, HYDROmorphone, lidocaine, LORazepam, ondansetron **OR** ondansetron (ZOFRAN) IV, sodium chloride flush  Vital signs in last 24 hours: Temp:  [97.8 F (36.6 C)-98.1 F (36.7 C)] 98.1 F (36.7 C) (01/31 1408) Pulse Rate:  [57-61] 61 (01/31 1408) Resp:  [14-17] 14 (01/31 1408) BP: (133-145)/(52-71)  133/52 (01/31 1408) SpO2:  [100 %] 100 % (01/31 1408) Last BM Date: 05/07/20  Intake/Output Summary (Last 24 hours) at 05/08/2020 1733 Last data filed at 05/08/2020 1149 Gross per 24 hour  Intake 816.17 ml  Output 400 ml  Net 416.17 ml     Physical Exam:  . General: Alert female in NAD . Heart:  Regular rate and rhythm. No lower extremity edema . Pulmonary: Normal respiratory effort . Abdomen: Soft, nondistended, nontender. Normal bowel sounds.  . Neurologic: Alert and oriented . Psych: Pleasant. Cooperative.   Filed Weights   05/05/20 1557  Weight: 54.3 kg    Intake/Output from previous day: 01/30 0701 - 01/31 0700 In: 876.2 [P.O.:120; I.V.:556.2; IV Piggyback:200] Out: 300 [Urine:300] Intake/Output this shift: Total I/O In: -  Out: 100 [Urine:100]    Lab Results: Recent Labs    05/06/20 0750 05/06/20 1620 05/08/20 0316  WBC 4.4 4.6 4.2  HGB 8.6* 8.7* 8.2*  HCT 26.2* 26.9* 25.5*  PLT 95* 108* 101*   BMET Recent Labs    05/06/20 0530 05/07/20 0417 05/08/20 0316  NA 140 139 139  K 3.7 3.1* 3.4*  CL 109 108 109  CO2 25 25 25   GLUCOSE 189* 129* 124*  BUN 20 15 11   CREATININE 0.53 0.50 0.51  CALCIUM  8.4* 8.2* 8.0*   LFT Recent Labs    05/06/20 0530  PROT 4.6*  ALBUMIN 2.0*  AST 47*  ALT 53*  ALKPHOS 169*  BILITOT 0.4   PT/INR No results for input(s): LABPROT, INR in the last 72 hours. Hepatitis Panel No results for input(s): HEPBSAG, HCVAB, HEPAIGM, HEPBIGM in the last 72 hours.  No results found.    Principal Problem:   GI bleed Active Problems:   Chronic obstructive pulmonary disease (HCC)   Type 2 diabetes mellitus without complication, without long-term current use of insulin (HCC)   Gastroesophageal reflux disease without esophagitis   Attention to G-tube (HCC)   Cholangiocarcinoma (HCC)   Malnutrition of moderate degree     LOS: 3 days   Tye Savoy ,NP 05/08/2020, 5:33 PM

## 2020-05-08 NOTE — Progress Notes (Signed)
Chaplain engaged in initial visit with Bonnie Peterson and her daughter Bonnie Peterson.  During visit, chaplain learned about the significant health challenges Bonnie Peterson.  Bonnie Peterson noted that her mom was doing relatively well, even with her cancer diagnosis, until she received a tube in her stomach.  She stated that the tube was supposed to make things better per the doctor, but instead she has seen her mom endure a rapid decline.  She showed chaplain pictures of all the blood, fluid and liquid that has been coming from the tube. She noted how uncomfortable it has been in recognition of what they having to clean up often and how it smells.  Bonnie Peterson also shared how much having the tube has impacted her mom's quality of life and overall spirit.  Her mom went from being very mobile to only being able to lay down in the bed.  She noted that her mom was able to do a number of things on her own, such as taking a shower, and now she is dependent on others for care.   Bonnie Peterson and Bonnie Peterson also discussed with chaplain how they have felt as a patient and as a visiting family member.  Bonnie Peterson voiced that she has conveyed different concerns to physicians and nurses only to feel like she is not being heard or well taken of by them.  She discussed a particular experience in her early healthcare journey of having a urinary tract infection and letting staff know that but instead she was told she had various other ailments and was sent away without the UTI being treated.  She eventually ended up blacking out due to the UTI and that was when she actually received the antibiotics that she needed.  Bonnie Peterson expressed wanting more help and education around the tube, and not having those needs met even with having hospice care services at home.  She requested that a GI doctor come out to their to look at her mom's stomach and that need went unanswered.  There was also another situation in which Bonnie Peterson developed a  rash on her leg which they believe came from one of the medications she was given and she was sent home without that being addressed.  It was not until Bonnie Peterson advocated on her behalf that something was actually done.   Bonnie Peterson and her mom feel that because she is in hospice care, medical staff have given up on her in a way that doesn't offer her any quality of life with the time she does have left. Bonnie Peterson voiced that she feels "numb" because of the rapid change in her mobility to feeling unseen and unheard by those giving her care.  Bonnie Peterson passionately voiced that she desires for her mom to have dignity with the time that she has left.  She understands what cancer does and what it will do but she sees her mom still with the ability to think, process and convey her feelings and desires she not suffer the way she has been in the last couple of months.  Ultimately, they feel like Bonnie Peterson has fallen by the wayside as a patient.  She is not comfortable.  Chaplain can assess that Bonnie Peterson and Bonnie Peterson both hold a distrust for the medical team or [facility] system because of the ways they have left the hospital and had to come right back due to concerns not being fully addressed.  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  She does not feel like her concerns are honored or even that she has been honored as a Musician being.  Chaplain could also assess the need Bonnie Peterson and Bonnie Peterson may have around much more education concerning Bonnie Peterson's condition.  Bonnie Peterson feels like she is often getting one thing from one doctor then something else from another doctor.  It may be helpful for them to sit down with a physician or team and be able to ask questions, develop a concrete plan and develop more understanding around what is happening with Bonnie Peterson's body and overall  care.  Bonnie Peterson seems to also desire more education around assisting her mom at home.   Chaplain provided ministries of presence, listening and prayer with Bonnie Peterson and Bonnie Peterson.  Chaplain will continue to follow-up.    05/08/20 1100  Clinical Encounter Type  Visited With Patient and family together  Visit Type Initial;Spiritual support;Social support  Stress Factors  Patient Stress Factors Health changes;Major life changes  Family Stress Factors Exhausted;Major life changes

## 2020-05-09 ENCOUNTER — Inpatient Hospital Stay (HOSPITAL_COMMUNITY): Admitting: Certified Registered"

## 2020-05-09 ENCOUNTER — Encounter (HOSPITAL_COMMUNITY): Payer: Self-pay | Admitting: Internal Medicine

## 2020-05-09 ENCOUNTER — Encounter (HOSPITAL_COMMUNITY)
Admission: EM | Disposition: A | Discharge status: Home / Self Care | Payer: Self-pay | Source: Home / Self Care | Attending: Internal Medicine

## 2020-05-09 HISTORY — PX: FOREIGN BODY REMOVAL: SHX962

## 2020-05-09 HISTORY — PX: ESOPHAGOGASTRODUODENOSCOPY (EGD) WITH PROPOFOL: SHX5813

## 2020-05-09 LAB — IRON AND TIBC
Iron: 44 ug/dL (ref 28–170)
Saturation Ratios: 27 % (ref 10.4–31.8)
TIBC: 164 ug/dL — ABNORMAL LOW (ref 250–450)
UIBC: 120 ug/dL

## 2020-05-09 LAB — GLUCOSE, CAPILLARY
Glucose-Capillary: 104 mg/dL — ABNORMAL HIGH (ref 70–99)
Glucose-Capillary: 93 mg/dL (ref 70–99)
Glucose-Capillary: 104 mg/dL — ABNORMAL HIGH (ref 70–99)
Glucose-Capillary: 101 mg/dL — ABNORMAL HIGH (ref 70–99)
Glucose-Capillary: 105 mg/dL — ABNORMAL HIGH (ref 70–99)

## 2020-05-09 LAB — BASIC METABOLIC PANEL
Anion gap: 7 (ref 5–15)
BUN: 8 mg/dL (ref 8–23)
CO2: 24 mmol/L (ref 22–32)
Calcium: 8.2 mg/dL — ABNORMAL LOW (ref 8.9–10.3)
Chloride: 108 mmol/L (ref 98–111)
Creatinine, Ser: 0.48 mg/dL (ref 0.44–1.00)
GFR, Estimated: >60 mL/min (ref >60)
Glucose, Bld: 111 mg/dL — ABNORMAL HIGH (ref 70–99)
Potassium: 3.3 mmol/L — ABNORMAL LOW (ref 3.5–5.1)
Sodium: 139 mmol/L (ref 135–145)

## 2020-05-09 LAB — RETICULOCYTES
Immature Retic Fract: 16.1 % — ABNORMAL HIGH (ref 2.3–15.9)
RBC.: 2.89 MIL/uL — ABNORMAL LOW (ref 3.87–5.11)
Retic Count, Absolute: 137 10*3/uL (ref 19.0–186.0)
Retic Ct Pct: 4.7 % — ABNORMAL HIGH (ref 0.4–3.1)

## 2020-05-09 LAB — VITAMIN B12: Vitamin B-12: 1054 pg/mL — ABNORMAL HIGH (ref 180–914)

## 2020-05-09 LAB — FOLATE: Folate: 4.4 ng/mL — ABNORMAL LOW (ref >5.9)

## 2020-05-09 LAB — FERRITIN: Ferritin: 99 ng/mL (ref 11–307)

## 2020-05-09 SURGERY — ESOPHAGOGASTRODUODENOSCOPY (EGD) WITH PROPOFOL
Anesthesia: Monitor Anesthesia Care

## 2020-05-09 MED ORDER — LACTATED RINGERS IV SOLN: INTRAVENOUS | Status: DC | PRN

## 2020-05-09 MED ORDER — PROPOFOL 500 MG/50ML IV EMUL
INTRAVENOUS | Status: AC
  Filled 2020-05-09: qty 50

## 2020-05-09 MED ORDER — PROPOFOL 10 MG/ML IV BOLUS
INTRAVENOUS | Status: DC | PRN
  Administered 2020-05-09: 20 mg via INTRAVENOUS
  Administered 2020-05-09: 10 mg via INTRAVENOUS

## 2020-05-09 MED ORDER — PROPOFOL 500 MG/50ML IV EMUL
INTRAVENOUS | Status: DC | PRN
  Administered 2020-05-09: 100 ug/kg/min via INTRAVENOUS

## 2020-05-09 SURGICAL SUPPLY — 15 items

## 2020-05-09 NOTE — Anesthesia Postprocedure Evaluation (Signed)
Anesthesia Post Note  Patient: JALYSSA FLEISHER  Procedure(s) Performed: ESOPHAGOGASTRODUODENOSCOPY (EGD) WITH PROPOFOL (N/A )     Patient location during evaluation: Endoscopy Anesthesia Type: MAC Level of consciousness: awake and alert Pain management: pain level controlled Vital Signs Assessment: post-procedure vital signs reviewed and stable Respiratory status: spontaneous breathing, nonlabored ventilation, respiratory function stable and patient connected to nasal cannula oxygen Cardiovascular status: blood pressure returned to baseline and stable Postop Assessment: no apparent nausea or vomiting Anesthetic complications: no   No complications documented.  Last Vitals:  Vitals:   05/09/20 1432 05/09/20 1441  BP:  (!) 152/38  Pulse:  62  Resp:  18  Temp: 36.6 C   SpO2:  100%    Last Pain:  Vitals:   05/09/20 1432  TempSrc: Oral  PainSc: 0-No pain                 Merlinda Frederick

## 2020-05-09 NOTE — Anesthesia Procedure Notes (Signed)
Procedure Name: MAC Date/Time: 05/09/2020 1:56 PM Performed by: Niel Hummer, CRNA Pre-anesthesia Checklist: Patient identified, Emergency Drugs available, Suction available and Patient being monitored Oxygen Delivery Method: Simple face mask

## 2020-05-09 NOTE — Anesthesia Preprocedure Evaluation (Signed)
Anesthesia Evaluation  Patient identified by MRN, date of birth, ID band Patient awake    Reviewed: Allergy & Precautions, NPO status , Patient's Chart, lab work & pertinent test results  Airway Mallampati: II       Dental  (+) Edentulous Upper, Edentulous Lower   Pulmonary sleep apnea , COPD, former smoker,    Pulmonary exam normal breath sounds clear to auscultation       Cardiovascular METS: 3 - Mets hypertension,  Rhythm:Regular Rate:Normal     Neuro/Psych PSYCHIATRIC DISORDERS Anxiety Depression    GI/Hepatic GERD  ,H/o of HCC   Endo/Other  diabetes  Renal/GU   negative genitourinary   Musculoskeletal  (+) Arthritis ,   Abdominal Normal abdominal exam  (+)   Peds negative pediatric ROS (+)  Hematology   Anesthesia Other Findings Diffuse rash, unknown etiology (patient states it's due to a reflux medication she was given in the hospital)  Reproductive/Obstetrics negative OB ROS                             Anesthesia Physical Anesthesia Plan  ASA: III  Anesthesia Plan: MAC   Post-op Pain Management:    Induction: Intravenous  PONV Risk Score and Plan: 2 and TIVA, Treatment may vary due to age or medical condition and Propofol infusion  Airway Management Planned: Natural Airway and Nasal Cannula  Additional Equipment:   Intra-op Plan:   Post-operative Plan:   Informed Consent: I have reviewed the patients History and Physical, chart, labs and discussed the procedure including the risks, benefits and alternatives for the proposed anesthesia with the patient or authorized representative who has indicated his/her understanding and acceptance.   Patient has DNR.  Discussed DNR with patient and Suspend DNR.     Plan Discussed with: CRNA, Anesthesiologist and Surgeon  Anesthesia Plan Comments: (Thorough discussion with patient re: propofol "allergy". After discussion,  patient OK to receive propofol and has done so in the past. Norton Blizzard, MD  )        Anesthesia Quick Evaluation

## 2020-05-09 NOTE — Transfer of Care (Signed)
Immediate Anesthesia Transfer of Care Note  [name]  Procedure(s) Performed: ESOPHAGOGASTRODUODENOSCOPY (EGD) WITH PROPOFOL (N/A )  Patient Location: PACU  Anesthesia Type:MAC  Level of Consciousness: awake, alert  and oriented  Airway & Oxygen Therapy: Patient Spontanous Breathing and Patient connected to face mask oxygen  Post-op Assessment: Report given to RN, Post -op Vital signs reviewed and stable and Patient moving all extremities X 4  Post vital signs: Reviewed and stable  Last Vitals:  Vitals Value Taken Time  BP    Temp    Pulse    Resp    SpO2      Last Pain:  Vitals:   05/09/20 1243  TempSrc: Oral  PainSc: 0-No pain      Patients Stated Pain Goal: 2 (84/16/60 6301)  Complications: No complications documented.

## 2020-05-09 NOTE — Progress Notes (Signed)
[facility] Patient Note  Garima Chronis is an [facility] hospice patient admittedto hospice services on10/7/21 with a terminal diagnosis of intrahepatic cholangiocarcinoma.Patient was recently hospitalized and had a g tube placed, she was discharged home and continued to have issues related to drainage and diarrhea. She was evaluated by interventional radiology on Thursday, and found no significant source of her continued issues with her g-tube. She returned home, the drainage worsened and per her daughter and caregiver [name], had become bloody as well. She returned to the ED on 1/28, and is admitted with suspected GI bleed. Hospice was made aware of her going to the ED. This is a related hospital admission per Dr. Konrad Dolores, physician for [facility].  Attempted visit with patient at bedside. Patient was in procedure for Upper GI endoscopy.  This was for evaluation of recent gastrointestinal bleeding. No Family present in room.   VS: 97.5, 158/58, 59, 15, 100 RA I/O: 890/650  Abn Labs:  05/09/2020 09:03 Potassium: 3.3 (L) Glucose: 111 (H) Calcium: 8.2 (L) TIBC: 164 (L) Folate: 4.4 (L) Vitamin B12: 1,054 (H) RBC.: 2.89 (L) Retic Ct Pct: 4.7 (H) Immature Retic Fract: 16.1 (H)  IVs/PRNs: D5NS @ 40 ml/hr, Bentyl 336m PRN per tube @1234 ,1457; Apresoline 224mPRN per tube@ [id number]; Dilaudid 0.5ng IV PRN @ 1234, 1457, 1535; Dilaudid 36m29mRN Per tube 1234, 1457; Ativan 0.5mg77mN per tube 1234, [id number] PRN IV [id number] List: Acute anemia of blood loss of unclear etiology  patient's hemoglobin dropped around 3 g in the last 1 week. Hemoglobin has stabilized around 8.  Started the patient on IV PPI . GI consulted to see if she is a candidate for any kind of intervention.  As per Dr HungBenson Norway refused colonoscopy and though an EGD is reasonable, gi suggests the benefit is low. GI recommends avoiding further intervention with her terminal  illness. Daughter is aware and would like to pursue the colonoscopy and EGD.  Lumbar GI reconsulted and she is scheduled for EGD later today Transfuse to keep hemoglobin greater than 7. Hemoglobin stabilized around 8. No obvious signs of bleeding.  Suspect her anemia is a combination of nutritional deficiencies, gi bleed and malignancy.   Severe dysphagia Currently has GJ tube which he is leaking.  IR to evaluate. Pt reports he takes clear liquids at home . Started her on clears at this time.  Replace the GJ tube versus up sizing the GJ tube.  GOC: ongoing, during last hospitalization patient and dtr adamant about not returning to hospital. Continues to have leakage, which is upsetting to the patient. RobiShirlean Mylarerstands that her mother is nearing the end of her life. She does want her to be treated for her leaking G-tube and feels this is a reasonable request.  D/C planning: return home with hospice support IDT: hospice team updated Family: not able to contact today. Will follow up 05/10/20  MaryFarrel Gordon, CCM [place] [facility])    [phone]

## 2020-05-09 NOTE — Interval H&P Note (Signed)
History and Physical Interval Note:  05/09/2020 12:21 PM  Bonnie Peterson  has presented today for surgery, with the diagnosis of anemia.  The various methods of treatment have been discussed with the patient and family. After consideration of risks, benefits and other options for treatment, the patient has consented to  Procedure(s): ESOPHAGOGASTRODUODENOSCOPY (EGD) WITH PROPOFOL (N/A) as a surgical intervention.  The patient's history has been reviewed, patient examined, no change in status, stable for surgery.  I have reviewed the patient's chart and labs.  Questions were answered to the patient's satisfaction.     Jamere Stidham

## 2020-05-09 NOTE — Op Note (Addendum)
[facility] Patient Name: Bonnie Peterson Procedure Date: 05/09/2020 MRN: [id number] Attending MD: Mauri Pole , MD Date of Birth: January 16, 1946 CSN: [id number] Age: 75 Admit Type: Inpatient Procedure:                Upper GI endoscopy Indications:              Recent gastrointestinal bleeding, Suspected upper                            gastrointestinal bleeding Providers:                Mauri Pole, MD, Erenest Rasher, RN, Benetta Spar, Technician, Maudry Diego, CRNA Referring MD:              Medicines:                Monitored Anesthesia Care Complications:            No immediate complications. Estimated Blood Loss:     Estimated blood loss was minimal. Procedure:                Pre-Anesthesia Assessment:                           - Prior to the procedure, a History and Physical                            was performed, and patient medications and                            allergies were reviewed. The patient's tolerance of                            previous anesthesia was also reviewed. The risks                            and benefits of the procedure and the sedation                            options and risks were discussed with the patient.                            All questions were answered, and informed consent                            was obtained. Prior Anticoagulants: The patient has                            taken no previous anticoagulant or antiplatelet                            agents. ASA Grade Assessment: IV - A patient with  severe systemic disease that is a constant threat                            to life. After reviewing the risks and benefits,                            the patient was deemed in satisfactory condition to                            undergo the procedure.                           After obtaining informed consent, the endoscope was                             passed under direct vision. Throughout the                            procedure, the patient's blood pressure, pulse, and                            oxygen saturations were monitored continuously. The                            GIF-H190 ([id number] Olympus gastroscope was                            introduced through the mouth, and advanced to the                            second part of duodenum. The upper GI endoscopy was                            accomplished without difficulty. The patient                            tolerated the procedure well. Scope In: Scope Out: Findings:      LA Grade C (one or more mucosal breaks continuous between tops of 2 or       more mucosal folds, less than 75% circumference) esophagitis with no       bleeding was found 34 to 36 cm from the incisors. No esophageal varices      A large amount of food (residue) was found in the gastric fundus and in       the gastric body. Removal of food was accomplished.      There was evidence of an intact gastrostomy with a patent GJ-tube       present in the gastric body. This was characterized by healthy appearing       mucosa. No gastric varices      A moderate extrinsic deformity was found in the duodenal bulb secondary       to metastaic cholangicarcinoma.      Few non-bleeding cratered and superficial duodenal ulcers with no       stigmata of bleeding were found in the duodenal bulb. Impression:               -  LA Grade C reflux esophagitis with no bleeding.                           - A large amount of food (residue) in the stomach.                            Removal was successful.                           - Intact gastrostomy with a patent GJ-tube present                            characterized by healthy appearing mucosa, J tube                            in the small bowel beyond visualized portion of                            duodenum(D2).                           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 Not amenable to stent placement.                           - Non-bleeding duodenal ulcers with no stigmata of                            bleeding. Moderate Sedation:      Not Applicable - Patient had care per Anesthesia. Recommendation:           - Patient has a contact number available for                            emergencies. The signs and symptoms of potential                            delayed complications were discussed with the                            patient. Return to normal activities tomorrow.                            Written discharge instructions were provided to the                            patient.                           - Resume previous diet through J-tube. Limit oral  intake                           - Continue present medications.                           - Follow an antireflux regimen indefinitely.                           - Use Protonix (pantoprazole) 40 mg IV twice daily.                           - Monitor Hgb and transfuse as needed                           - Called patient's daughter and discussed findings                            in detail. Her main concern was leakage from G                            tube. Explained to her that it appeared to be in                            place to function as intended. Advised her to                            discuss with Primary team and IR if she has further                            concerns                           - GI will sign off, is available if have any                            question. Procedure Code(s):        --- Professional ---                           [phone], Esophagogastroduodenoscopy, flexible,                            transoral; with removal of foreign body(s) Diagnosis Code(s):        --- Professional ---                            K21.00, Gastro-esophageal reflux disease with                            esophagitis, without bleeding                           T18.2XXA, Foreign body in stomach, initial encounter  Z93.1, Gastrostomy status                           K31.89, Other diseases of stomach and duodenum                           K26.9, Duodenal ulcer, unspecified as acute or                            chronic, without hemorrhage or perforation                           K92.2, Gastrointestinal hemorrhage, unspecified CPT copyright 2019 American [employer] Association. All rights reserved. The codes documented in this report are preliminary and upon coder review may  be revised to meet current compliance requirements. Mauri Pole, MD 05/09/2020 2:22:30 PM This report has been signed electronically. Number of Addenda: 0

## 2020-05-09 NOTE — Interval H&P Note (Signed)
History and Physical Interval Note:  05/09/2020 1:40 PM  Bonnie Peterson  has presented today for surgery, with the diagnosis of anemia.  The various methods of treatment have been discussed with the patient and family. After consideration of risks, benefits and other options for treatment, the patient has consented to  Procedure(s): ESOPHAGOGASTRODUODENOSCOPY (EGD) WITH PROPOFOL (N/A) as a surgical intervention.  The patient's history has been reviewed, patient examined, no change in status, stable for surgery.  I have reviewed the patient's chart and labs.  Questions were answered to the patient's satisfaction.     Jakala Herford

## 2020-05-09 NOTE — Progress Notes (Signed)
PROGRESS NOTE    Bonnie Peterson  [id number] DOB: 1945-05-27 DOA: 05/05/2020 PCP: Lattie Haw, MD    Chief Complaint  Patient presents with  . Leaking peg tube    Brief Narrative:  75 year old lady with prior history of metastatic cholangiocarcinoma s/p left hepatectomy currently on hospice care, esophagitis, GERD, diabetes, COPD, GJ tube by radiology admitted for nonfunctioning/leaking of GJ tube, sacral decubitus ulcer, obstructive sleep apnea admitted for GJ tube malfunction.  She was recently hospitalized and her G-tube was transitioned/converted to [place] tube.  She was found to have aspiration pneumonia and on liquid Augmentin to complete the course.  Patient also was found to have 3 g hemoglobin drop since last week GI bleed suspected.  GI consulted for further evaluation. She is scheduled for EGD by Dr. Silverio Decamp today.  Patient seen and examined.  She  appears to be in good spirits, denies any new complaints and wants to know when her GJ tube placed going to be replaced   Assessment & Plan:   Principal Problem:   GI bleed Active Problems:   Chronic obstructive pulmonary disease (HCC)   Type 2 diabetes mellitus without complication, without long-term current use of insulin (HCC)   Gastroesophageal reflux disease without esophagitis   PEG (percutaneous endoscopic gastrostomy) adjustment/replacement/removal (HCC)   Melena   Cholangiocarcinoma (HCC)   Malnutrition of moderate degree   Acute anemia of blood loss of unclear etiology  patient's hemoglobin dropped around 3 g in the last 1 week. Hemoglobin has stabilized around 8.  Started the patient on IV PPI . GI consulted to see if she is a candidate for any kind of intervention.  As per Dr Benson Norway, pt refused colonoscopy and though an EGD is reasonable, gi suggests the benefit is low. GI recommends avoiding further intervention with her terminal illness. Daughter is aware and would like to pursue the colonoscopy and EGD.  Lumbar GI  reconsulted and she is scheduled for EGD later today Transfuse to keep hemoglobin greater than 7. Hemoglobin stabilized around 8. No obvious signs of bleeding.  Suspect her anemia is a combination of nutritional deficiencies, gi bleed and malignancy.  Folate will be replaced.   Severe dysphagia Currently has GJ tube which he is leaking.  IR to evaluate. Pt reports he takes clear liquids at home . Started her on clears at this time.  Replace the GJ tube versus up sizing the GJ tube.   Malignant cholangiocarcinoma No available treatment at this time. Currently on hospice care.    Type 2 diabetes mellitus Continue sliding scale insulin. CBG (last 3)  Recent Labs    05/09/20 0355 05/09/20 0744 05/09/20 1158  GLUCAP 104* 93 104*   No changes in medications at this time.   COPD No wheezing heard on room air   Thrombocytopenia Unclear etiology, possibly from the cholangiocarcinoma.  continue to monitor. Platelets stable  And >100,000   Mildly elevated liver enzymes, T bili within normal limits Probably secondary to cholangiocarcinoma.  GERD  Stable.      Hypophosphatemia Replaced    Hypokalemia Replaced  Stage 3 pressure injury on the sacrum, on admission with minimal serosanguineous drainage. Wound care consulted and recommended Cleanse sacral wound with NS.  Apply lidocaine gel for pain relief.  Fill dead space with alginate (LAWSON # [id number])  Cover with silicone foam.  Peel back foam to reapply gel.  Change foam and alginate every other day.    Elevated BP parameters, no h/o of hypertension:  Will  add on prn hydralazine.    DVT prophylaxis: SCD'S Code Status: DNR Family Communication: none at bedside. Discussed the plan with daughter over the phone.  Disposition:   Status is: Inpatient  Remains inpatient appropriate because:Ongoing diagnostic testing needed not appropriate for outpatient work up and IV treatments appropriate due to intensity of  illness or inability to take PO   Dispo: The patient is from: Home              Anticipated d/c is to: pending.              Anticipated d/c date is: 2 days              Patient currently is not medically stable to d/c.   Difficult to place patient No   Level of care: Med-Surg     Consultants:   Gastroenterology.  Palliative.   IR   Procedures: none.    Antimicrobials:  None.    Subjective: No new complaints today.  Objective: Vitals:   05/08/20 1408 05/08/20 2119 05/09/20 0644 05/09/20 1243  BP: (!) 133/52 (!) 144/65 (!) 133/53 (!) 183/55  Pulse: 61 75 76 65  Resp: 14 18 18  (!) 23  Temp: 98.1 F (36.7 C) 98 F (36.7 C) 98.2 F (36.8 C) 98.7 F (37.1 C)  TempSrc: Oral Oral Oral Oral  SpO2: 100% 100% 98% 100%  Weight:    54.3 kg  Height:    5' 3"  (1.6 m)    Intake/Output Summary (Last 24 hours) at 05/09/2020 1258 Last data filed at 05/09/2020 1000 Gross per 24 hour  Intake 890.6 ml  Output 750 ml  Net 140.6 ml   Filed Weights   05/05/20 1557 05/09/20 1243  Weight: 54.3 kg 54.3 kg    Examination:  General exam: Elderly lady comfortable not in any kind of distress Respiratory system: Air entry fair bilateral, no wheezing or rhonchi Cardiovascular system: S1-S2 heard, regular rate rhythm, no JVD, no pedal edema Gastrointestinal system: Abdomen is soft nontender, nondistended G-tube in place Central nervous system: Alert and oriented, non focal.  Extremities: No pedal edema Skin: stage 3 pressure ulcer on the sacrum.  Psychiatry: Mood is appropriate    Data Reviewed: I have personally reviewed following labs and imaging studies  CBC: Recent Labs  Lab 05/05/20 1756 05/05/20 2021 05/06/20 0530 05/06/20 0750 05/06/20 1620 05/08/20 0316  WBC 5.6 5.5 4.3 4.4 4.6 4.2  NEUTROABS 4.7  --   --   --   --   --   HGB 8.9* 8.8* 8.3* 8.6* 8.7* 8.2*  HCT 27.0* 26.9* 25.5* 26.2* 26.9* 25.5*  MCV 93.8 95.1 95.5 94.2 95.4 96.6  PLT 102* 104* 95* 95* 108*  101*    Basic Metabolic Panel: Recent Labs  Lab 05/05/20 1756 05/06/20 0530 05/07/20 0417 05/08/20 0316 05/09/20 0903  NA 140 140 139 139 139  K 3.9 3.7 3.1* 3.4* 3.3*  CL 106 109 108 109 108  CO2 24 25 25 25 24   GLUCOSE 107* 189* 129* 124* 111*  BUN 18 20 15 11 8   CREATININE 0.58 0.53 0.50 0.51 0.48  CALCIUM 8.8* 8.4* 8.2* 8.0* 8.2*  MG 2.1  --  1.9  --   --   PHOS  --   --  2.4*  --   --     GFR: Estimated Creatinine Clearance: 51.8 mL/min (by C-G formula based on SCr of 0.48 mg/dL).  Liver Function Tests: Recent Labs  Lab 05/06/20 0530  AST 47*  ALT 53*  ALKPHOS 169*  BILITOT 0.4  PROT 4.6*  ALBUMIN 2.0*    CBG: Recent Labs  Lab 05/08/20 1955 05/08/20 2352 05/09/20 0355 05/09/20 0744 05/09/20 1158  GLUCAP 115* 108* 104* 93 104*     Recent Results (from the past 240 hour(s))  SARS Coronavirus 2 by RT PCR (hospital order, performed in [facility] hospital lab) Nasopharyngeal Nasopharyngeal Swab     Status: None   Collection Time: 05/05/20 10:05 PM   Specimen: Nasopharyngeal Swab  Result Value Ref Range Status   SARS Coronavirus 2 NEGATIVE NEGATIVE Final    Comment: (NOTE) SARS-CoV-2 target nucleic acids are NOT DETECTED.  The SARS-CoV-2 RNA is generally detectable in upper and lower respiratory specimens during the acute phase of infection. The lowest concentration of SARS-CoV-2 viral copies this assay can detect is 250 copies / mL. A negative result does not preclude SARS-CoV-2 infection and should not be used as the sole basis for treatment or other patient management decisions.  A negative result may occur with improper specimen collection / handling, submission of specimen other than nasopharyngeal swab, presence of viral mutation(s) within the areas targeted by this assay, and inadequate number of viral copies (<250 copies / mL). A negative result must be combined with clinical observations, patient history, and epidemiological  information.  Fact Sheet for Patients:   StrictlyIdeas.no  Fact Sheet for Healthcare Providers: BankingDealers.co.za  This test is not yet approved or  cleared by the [place] FDA and has been authorized for detection and/or diagnosis of SARS-CoV-2 by FDA under an Emergency Use Authorization (EUA).  This EUA will remain in effect (meaning this test can be used) for the duration of the COVID-19 declaration under Section 564(b)(1) of the Act, 21 U.S.C. section 360bbb-3(b)(1), unless the authorization is terminated or revoked sooner.  Performed at [facility] [place] [address], [place], [place] [address]          Radiology Studies: No results found.      Scheduled Meds: . [MAR Hold] amoxicillin-clavulanate  500 mg Per Tube Q8H  . [MAR Hold] Chlorhexidine Gluconate Cloth  6 each Topical Daily  . [MAR Hold] feeding supplement (OSMOLITE 1.5 CAL)  1,000 mL Per Tube Q24H  . [MAR Hold] multivitamin  15 mL Per Tube Daily  . [MAR Hold] ondansetron (ZOFRAN) IV  4 mg Intravenous Once  . [MAR Hold] pantoprazole (PROTONIX) IV  40 mg Intravenous Q12H   Continuous Infusions: . dextrose 5 % and 0.9% NaCl 40 mL/hr at 05/08/20 2147     LOS: 4 days       Hosie Poisson, MD [facility]   To contact the attending provider between 7A-7P or the covering provider during after hours 7P-7A, please log into the web site [email or web address] and access using universal [place] password for that web site. If you do not have the password, please call the hospital operator.  05/09/2020, 12:58 PM

## 2020-05-10 ENCOUNTER — Inpatient Hospital Stay (HOSPITAL_COMMUNITY)

## 2020-05-10 DIAGNOSIS — K2971 Gastritis, unspecified, with bleeding: Secondary | ICD-10-CM

## 2020-05-10 HISTORY — PX: IR GJ TUBE CHANGE: IMG1440

## 2020-05-10 LAB — BASIC METABOLIC PANEL
Anion gap: 8 (ref 5–15)
BUN: 7 mg/dL — ABNORMAL LOW (ref 8–23)
CO2: 24 mmol/L (ref 22–32)
Calcium: 8 mg/dL — ABNORMAL LOW (ref 8.9–10.3)
Chloride: 107 mmol/L (ref 98–111)
Creatinine, Ser: 0.52 mg/dL (ref 0.44–1.00)
GFR, Estimated: >60 mL/min (ref >60)
Glucose, Bld: 119 mg/dL — ABNORMAL HIGH (ref 70–99)
Potassium: 2.9 mmol/L — ABNORMAL LOW (ref 3.5–5.1)
Sodium: 139 mmol/L (ref 135–145)

## 2020-05-10 LAB — CBC
HCT: 26.7 % — ABNORMAL LOW (ref 36.0–46.0)
Hemoglobin: 8.6 g/dL — ABNORMAL LOW (ref 12.0–15.0)
MCH: 30.8 pg (ref 26.0–34.0)
MCHC: 32.2 g/dL (ref 30.0–36.0)
MCV: 95.7 fL (ref 80.0–100.0)
Platelets: 117 10*3/uL — ABNORMAL LOW (ref 150–400)
RBC: 2.79 MIL/uL — ABNORMAL LOW (ref 3.87–5.11)
RDW: 17.1 % — ABNORMAL HIGH (ref 11.5–15.5)
WBC: 4.6 10*3/uL (ref 4.0–10.5)
nRBC: 0 % (ref 0.0–0.2)

## 2020-05-10 LAB — GLUCOSE, CAPILLARY
Glucose-Capillary: 114 mg/dL — ABNORMAL HIGH (ref 70–99)
Glucose-Capillary: 114 mg/dL — ABNORMAL HIGH (ref 70–99)
Glucose-Capillary: 117 mg/dL — ABNORMAL HIGH (ref 70–99)
Glucose-Capillary: 120 mg/dL — ABNORMAL HIGH (ref 70–99)
Glucose-Capillary: 127 mg/dL — ABNORMAL HIGH (ref 70–99)
Glucose-Capillary: 96 mg/dL (ref 70–99)

## 2020-05-10 MED ORDER — FENTANYL CITRATE (PF) 100 MCG/2ML IJ SOLN: 50.0000 ug | Freq: Once | INTRAMUSCULAR | Status: AC

## 2020-05-10 MED ORDER — LIDOCAINE VISCOUS HCL 2 % MT SOLN
OROMUCOSAL | Status: AC
  Filled 2020-05-10: qty 15

## 2020-05-10 MED ORDER — POTASSIUM CHLORIDE 10 MEQ/100ML IV SOLN
10.0000 meq | INTRAVENOUS | Status: AC
  Administered 2020-05-10 (×5): 10 meq via INTRAVENOUS
  Filled 2020-05-10 (×2): qty 100

## 2020-05-10 MED ORDER — IOHEXOL 300 MG/ML  SOLN: 50.0000 mL | Freq: Once | INTRAMUSCULAR | Status: DC | PRN

## 2020-05-10 MED ORDER — SODIUM CHLORIDE (PF) 0.9 % IJ SOLN
INTRAMUSCULAR | Status: AC
  Filled 2020-05-10: qty 10

## 2020-05-10 MED ORDER — LIDOCAINE HCL 1 % IJ SOLN
INTRAMUSCULAR | Status: AC
  Filled 2020-05-10: qty 20

## 2020-05-10 MED ORDER — LIDOCAINE-EPINEPHRINE 1 %-1:100000 IJ SOLN
INTRAMUSCULAR | Status: AC
  Filled 2020-05-10: qty 1

## 2020-05-10 MED ORDER — FENTANYL CITRATE (PF) 100 MCG/2ML IJ SOLN
INTRAMUSCULAR | Status: AC
  Administered 2020-05-10: 50 ug via INTRAVENOUS
  Filled 2020-05-10: qty 2

## 2020-05-10 MED ORDER — KCL IN DEXTROSE-NACL 20-5-0.9 MEQ/L-%-% IV SOLN
INTRAVENOUS | Status: DC
  Filled 2020-05-10: qty 1000

## 2020-05-10 MED ORDER — PANTOPRAZOLE SODIUM 40 MG PO PACK
40.0000 mg | PACK | Freq: Two times a day (BID) | ORAL | Status: DC
  Filled 2020-05-10: qty 20

## 2020-05-10 MED ORDER — POTASSIUM CHLORIDE 20 MEQ PO PACK
40.0000 meq | PACK | ORAL | Status: DC
  Filled 2020-05-10 (×2): qty 2

## 2020-05-10 MED ORDER — OSMOLITE 1.5 CAL PO LIQD
1000.0000 mL | ORAL | Status: DC
  Filled 2020-05-10: qty 1000

## 2020-05-10 NOTE — Progress Notes (Signed)
HEMATOLOGY-ONCOLOGY PROGRESS NOTE  SUBJECTIVE: [name] is known to me. She was admitted for GJ tube malfunction and GI bleeding. She underwent GJ tube exchange by IR today. She is feeling better. She is alert, but seems to be somewhat incoherent. She was eating ice cream when I saw her.    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 Mettu 1. 2014 Mass in the left side of the liver incidentally found during work up for acute pancreatitis.  The mass was thought to be a bile duct lesion and was followed up.  2. 02/2013 MRI hyperdense focus within the left hepatic lobe.  3. 02/21/13 CT scan 3.7 x 2.1 x 2.4 cm arterial enhancing lesion.  AFP and CEA were normal.  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 4. 05/18/12 Biopsy of something (surg path in Care Everywhere, but no report) 5. 07/02/13 Biopsy of something (surg path in Care Everywhere, but no report) 6. 09/02/13 MRI lesion in segment 4A with adjacent perfusional enhancement with some washout. Lesion is close to left portal vein. 7. 09/15/14 MRI segment 4A lesion slightly increased in size, minimal fat containing, peripheral enhancement and arterial enhancement. Left portal vein is clearly not involved.   02/19/2015 Surgery   Per notes from [facility] by Dr. Dennison Nancy L lobe of liver partial hepatectomy segments 2, 3, 4a, 4b invasive cholangiocarcinoma, moderately differentiated. Tumor focally extends to the parenchymal margin of resection. Uninvolved liver parenchyma with steatohepatitis and associated periportal and centrilobular pericellular fibrosis. Scattered von Meyenburg complexes. One hepatic artery LN with metastatic cholangiocarcinoma. Fibroadipose tissue and nerve negative for malignancy. Gallbladder negative for malignancy. One LN negative for malignancy. T2aN1 invasive  cholangiocarcinoma, moderately differentiated, with positive hepatic parenchymal margin, and 1/2 nodes positive for tumor involvement. -Post operative course was complicated by fevers felt to be related to atelectasis   04/17/2015 Concurrent Chemotherapy   Per notes from [place] by Dr. Dennison Nancy -04/17/15-05/26/15 Chemoradiation with capecitabine (locally) 50.4 Gy in 28 fractions.  -Patient started getting sick, hospitalized, fluids twice, was on ondansetron, and propranolol, and sucralfate  -EGD found ulcers in stomach  -06/14/15 CTA (per Dr. Shayne Alken note) no evidence of disease recurrence.  Formation of the stomach and duodenum was seen.   08/2015 - 2017 Chemotherapy   08/2015 4 cycles of gemcitabine.  Patient intolerant to this treatment with weight loss, anorexia, and fatigue.    10/16/2015 Surgery   Per notes from [place] by Dr. Dennison Nancy -CTA chest abdomen and pelvis status post left hepatectomy with no evidence of recurrent disease or metastatic disease.  Right lower lobe pulmonary nodules unchanged.  CA 19-9 7   08/11/2017 Pathology Results   Per notes from [place] by Dr. Dennison Nancy -07/29/17 CT abd -U/S biopsy of liver    08/27/2017 Imaging   Per note from [facility] by Dr. Dennison Nancy --08/25/17 CT chest, abdomen, and pelvis ill-defined hypoenhancing lesion in the inferior right liver concerning for m metastatic disease. Portocaval node measuring up to 1.2 cm in short axis   08/28/2017 Pathology Results   Per note from [facility] by Dr. Dennison Nancy -Invasive moderate [place] differentiated adenocarcinoma morphologically compatible with cholangio- -09/02/17 [facility]DC consensus appears consistent with intrahepatic cholangiocarcinoma and surgical resection not appropriate.  If after systemic therapy she remains stable, no extrahepatic disease, could consider HAI pump in future.  Ablation not an option.   09/24/2017 -  10/15/2017 Radiation Therapy   Per note from [facility] by Dr. Dennison Nancy -XRT x3 weeks   12/30/2017 Imaging   Per note from [facility] by Dr.  Dennison Nancy CT chest, abdomen, and pelvis slight interval increase in size of metastatic lesion in the liver with increasing necrosis.  Stable right lower lobe pulmonary nodule and portacaval lymph nodes   10/14/2018 Imaging   Per note from [facility] by Dr. Dennison Nancy CT chest, abdomen, and pelvis decrease size of liver lesions, PVT new, 1 to centimeter lymph node   05/14/2019 Imaging   Per note from [facility] by Dr. Dennison Nancy -CT infiltrative mass within the porta hepatis encasing the portal triads measuring up to 4.1 cm, concerning for residual/recurrent cholangiocarcinoma.  There is worsening thrombosis/tumor in the vein involving the entire portal venous system now extending into the right portal vein and distal aspect of the splenic vein.  There are at least 6 new liver lesions concerning for multifocal hepatic metastases.  Stable retroperitoneal lymphadenopathy.  Similar appearing distal gastric wall thickening, likely post radiation changes.  Stable subcentimeter pulmonary nodules, the largest within the right lower lobe measuring 0.8 cm.  Recommend continued attention to follow-up.   05/14/2019 Cancer Staging   Staging form: Intrahepatic Bile Duct, AJCC 8th Edition - Clinical stage from 05/14/2019: Stage IV (cTX, cNX, cM1) - Signed by Truitt Merle, MD on 06/20/2019   06/21/2019 - 08/02/2019 Chemotherapy   First-line 5-fluorouracil and leucovorin IV every 2 weeks starting 06/21/19. Chemo break since 08/02/19 due to poor tolerance. She declined restarting    08/12/2019 Procedure   Colonosocpy by Dr [name]  - There were several lipomas spread throughout the colon. The largest was in the sigmoid (pedunculated 2-3cm, slightly inflamed at the tip). This was unchanged from 2015, the previous submucosal tattoo was still evident. - Internal hemorrhoids (this is likely the cause of your minor rectal bleeding). - Diverticulosis in the left colon. - The examination was otherwise normal on direct and retroflexion views. - No polyps or  cancers.   08/17/2019 Imaging   CT AP W contrast  IMPRESSION: 1. Multifocal hepatic malignancy with multiple new enhancing lesions in the RIGHT hepatic lobe compared to CT 07/29/2017 (interim CT not available). 2. Post LEFT hepatectomy. 3. Poorly defined tissue planes in the porta hepatis concerning for tumor infiltration of the porta hepatis. 4. 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 5. Probable periportal adenopathy but difficult to define tissue planes 6. No disease outside the liver/porta hepatis identified.        REVIEW OF SYSTEMS:   Constitutional: Denies fevers, chills  Eyes: Denies blurriness of vision Ears, nose, mouth, throat, and face: Denies mucositis or sore throat Respiratory: Denies cough, dyspnea or wheezes Cardiovascular: Denies palpitation, chest discomfort Gastrointestinal:(+) abdominal pain, denies nausea and vomiting Skin: Denies abnormal skin rashes Lymphatics: Denies new lymphadenopathy or easy bruising Neurological:Denies numbness, tingling or new weaknesses Behavioral/Psych: Mood is stable, no new changes  Extremities: No lower extremity edema All other systems were reviewed with the patient and are negative.  I have reviewed the past medical history, past surgical history, social history and family history with the patient and they are unchanged from previous note.   PHYSICAL EXAMINATION: ECOG PERFORMANCE STATUS: 3 - Symptomatic, >50% confined to bed  Vitals:   05/10/20 1323 05/10/20 2058  BP: (!) 158/60 (!) 178/82  Pulse: 73 82  Resp: 17 17  Temp: (!) 97.5  F (36.4 C) 98.5 F (36.9 C)  SpO2:  99%   Filed Weights   05/05/20 1557 05/09/20 1243  Weight: 119 lb 11.4 oz (54.3 kg) 119 lb 11.4 oz (54.3 kg)    Intake/Output from previous day: 02/01 0701 - 02/02 0700 In: 1903.8 [P.O.:360; I.V.:1537.8; NG/GT:6] Out: 200  [Urine:200]  GENERAL: Chronically ill-appearing female, cachectic SKIN: skin color, texture, turgor are normal, no rashes or significant lesions EYES: normal, Conjunctiva are pink and non-injected, sclera clear OROPHARYNX:no exudate, no erythema and lips, buccal mucosa, and tongue normal  LUNGS: clear to auscultation and percussion with normal breathing effort HEART: regular rate & rhythm and no murmurs and no lower extremity edema ABDOMEN: Mildly distended, generalized tenderness, new GJ tube covered by gauze NEURO: alert & oriented x 3 with fluent speech, no focal motor/sensory deficits  LABORATORY DATA:  I have reviewed the data as listed CMP Latest Ref Rng & Units 05/10/2020 05/09/2020 05/08/2020  Glucose 70 - 99 mg/dL 119(H) 111(H) 124(H)  BUN 8 - 23 mg/dL 7(L) 8 11  Creatinine 0.44 - 1.00 mg/dL 0.52 0.48 0.51  Sodium 135 - 145 mmol/L 139 139 139  Potassium 3.5 - 5.1 mmol/L 2.9(L) 3.3(L) 3.4(L)  Chloride 98 - 111 mmol/L 107 108 109  CO2 22 - 32 mmol/L 24 24 25   Calcium 8.9 - 10.3 mg/dL 8.0(L) 8.2(L) 8.0(L)  Total Protein 6.5 - 8.1 g/dL - - -  Total Bilirubin 0.3 - 1.2 mg/dL - - -  Alkaline Phos 38 - 126 U/L - - -  AST 15 - 41 U/L - - -  ALT 0 - 44 U/L - - -    Lab Results  Component Value Date   WBC 4.6 05/10/2020   HGB 8.6 (L) 05/10/2020   HCT 26.7 (L) 05/10/2020   MCV 95.7 05/10/2020   PLT 117 (L) 05/10/2020   NEUTROABS 4.7 05/05/2020    DG Abd 1 View  Result Date: 04/25/2020 CLINICAL DATA:  Abdominal distention. EXAM: ABDOMEN - 1 VIEW COMPARISON:  IR images 04/24/2020.  Abdomen 04/24/2020 FINDINGS: Interim placement of NG tube, its tip is over the stomach. Gastrojejunostomy catheter in stable position. Surgical clips right upper quadrant. Oral contrast noted what appears to be colon. No bowel distention. Degenerative change lumbar spine. IMPRESSION: 1. Interim placement of NG tube, its tip is over the stomach. Gastrojejunostomy catheter in stable position. 2. Oral contrast  noted in what appears to be colon. No bowel distention. Electronically Signed   By: Burney   On: 04/25/2020 06:30   IR GASTR TUBE CONVERT GASTR-JEJ PER W/FL MOD SED  Result Date: 04/24/2020 INDICATION: History of metastatic cholangiocarcinoma, currently on hospice. Patient underwent image guided placement of a pull-through venting gastrostomy tube on 02/21/2020 for palliative purposes. Attempt was made at the time of gastrostomy tube placement to convert the gastrostomy tube to a gastrojejunostomy catheter however this was unsuccessful secondary to subtotal occlusion of the gastric antrum. Patient returns today with complaint of excessive pericatheter leakage and as such presents for fluoroscopic guided gastrostomy tube evaluation and management. EXAM: 1. FLUOROSCOPIC GUIDED CONVERSION OF EXISTING GASTROSTOMY TUBE TO A GASTROJEJUNOSTOMY TUBE 2. FLUOROSCOPIC GUIDED PLACEMENT OF A NASOGASTRIC TUBE. COMPARISON:  Image guided pull-through gastrostomy tube placement-02/21/2020 MEDICATIONS: None. CONTRAST:  15m OMNIPAQUE IOHEXOL 300 MG/ML SOLN administered into the gastric lumen and proximal small bowel FLUOROSCOPY TIME:  22 minutes, 24 seconds (3786mGy) COMPLICATIONS: None. PROCEDURE: Informed written consent was obtained from the patient after a discussion of the risks  and benefits. The upper abdomen and the external portion of the existing gastrostomy tube was prepped and draped in the usual sterile fashion, and a sterile drape was applied covering the operative field. Maximum barrier sterile technique with sterile gowns and gloves were used for the procedure. A timeout was performed prior to the initiation of the procedure. The gastrostomy tube was injected with a small amount of contrast demonstrating significant distension the stomach which is noted to be filled with a large amount of debris. Ultimately with prolonged diligent effort, a stiff glidewire was advanced through the subtotal occlusion of  the gastric antrum with the use of a short Kumpe catheter. Under imaged fluoroscopic guidance, the Kumpe catheter was exchanged for a coaxial 9-French gastrojejunostomy catheter which was advanced over the stiff Glidewire with end ultimately co terminating within the proximal jejunum. Contrast injection demonstrated appropriate positioning and functionality of both the gastric and jejunal lumens. Given the significant amount of debris within the stomach as well as patient's presenting complaint of excessive pericatheter leakage, an 18 gauge nasogastric 2 was advanced through the left narrowings to the level of the stomach under intermittent fluoroscopic guidance after the left naris was anesthetized with viscous lidocaine. Postprocedural spot fluoroscopic images were obtained and the procedure was terminated. The nasogastric tube was affixed at the nose with retention tape. Dressings were applied. The patient tolerated procedure well without immediate postprocedural complication. IMPRESSION: 1. Successful fluoroscopic guided conversion of pull-through gastrostomy tube to a gastrojejunostomy catheter with jejunal limb terminating within the proximal jejunum. 2. Successful fluoroscopic guided placement of an 60 French nasogastric tube for the purposes of maximum gastric venting. PLAN: - Given the large amount of retained debris within the stomach, recommended the nasogastric tube stay in place for 1-2 days connected to low wall suction to allow for optimal gastric venting. - The jejunal lumen is ready for immediate use for feeding and the gastric lumen may be utilized for venting purposes as indicated. 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 Electronically Signed   By: Sandi Mariscal M.D.   On: 04/24/2020 16:35   IR INTRO LONG GI TUBE  Result Date: 04/24/2020 INDICATION: History of metastatic  cholangiocarcinoma, currently on hospice. Patient underwent image guided placement of a pull-through venting gastrostomy tube on 02/21/2020 for palliative purposes. Attempt was made at the time of gastrostomy tube placement to convert the gastrostomy tube to a gastrojejunostomy catheter however this was unsuccessful secondary to subtotal occlusion of the gastric antrum. Patient returns today with complaint of excessive pericatheter leakage and as such presents for fluoroscopic guided gastrostomy tube evaluation and management. EXAM: 1. FLUOROSCOPIC GUIDED CONVERSION OF EXISTING GASTROSTOMY TUBE TO A GASTROJEJUNOSTOMY TUBE 2. FLUOROSCOPIC GUIDED PLACEMENT OF A NASOGASTRIC TUBE. COMPARISON:  Image guided pull-through gastrostomy tube placement-02/21/2020 MEDICATIONS: None. CONTRAST:  83m OMNIPAQUE IOHEXOL 300 MG/ML SOLN administered into the gastric lumen and proximal small bowel FLUOROSCOPY TIME:  22 minutes, 24 seconds (3638mGy) COMPLICATIONS: None. PROCEDURE: Informed written consent was obtained from the patient after a discussion of the risks and benefits. The upper abdomen and the external portion of the existing gastrostomy tube was prepped and draped in the usual sterile fashion, and a sterile drape was applied covering the operative field. Maximum barrier sterile technique with sterile gowns and gloves were used for the procedure. A timeout was performed prior to the initiation of the procedure. The gastrostomy  tube was injected with a small amount of contrast demonstrating significant distension the stomach which is noted to be filled with a large amount of debris. Ultimately with prolonged diligent effort, a stiff glidewire was advanced through the subtotal occlusion of the gastric antrum with the use of a short Kumpe catheter. Under imaged fluoroscopic guidance, the Kumpe catheter was exchanged for a coaxial 9-French gastrojejunostomy catheter which was advanced over the stiff Glidewire with end  ultimately co terminating within the proximal jejunum. Contrast injection demonstrated appropriate positioning and functionality of both the gastric and jejunal lumens. Given the significant amount of debris within the stomach as well as patient's presenting complaint of excessive pericatheter leakage, an 18 gauge nasogastric 2 was advanced through the left narrowings to the level of the stomach under intermittent fluoroscopic guidance after the left naris was anesthetized with viscous lidocaine. Postprocedural spot fluoroscopic images were obtained and the procedure was terminated. The nasogastric tube was affixed at the nose with retention tape. Dressings were applied. The patient tolerated procedure well without immediate postprocedural complication. IMPRESSION: 1. Successful fluoroscopic guided conversion of pull-through gastrostomy tube to a gastrojejunostomy catheter with jejunal limb terminating within the proximal jejunum. 2. Successful fluoroscopic guided placement of an 36 French nasogastric tube for the purposes of maximum gastric venting. PLAN: - Given the large amount of retained debris within the stomach, recommended the nasogastric tube stay in place for 1-2 days connected to low wall suction to allow for optimal gastric venting. - The jejunal lumen is ready for immediate use for feeding and the gastric lumen may be utilized for venting purposes as indicated. 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 Electronically Signed   By: Sandi Mariscal M.D.   On: 04/24/2020 16:35   DG ABDOMEN PEG TUBE LOCATION  Result Date: 05/05/2020 CLINICAL DATA:  Leaking around [place] tube EXAM: ABDOMEN - 1 VIEW COMPARISON:  04/25/2020 FINDINGS: Gastrojejunostomy tube is in place. Contrast was injected through the gastrostomy portion of the of the tube which fills the stomach. No evidence of contrast  extravasation or leak. The jejunostomy portion of the catheter is in the proximal jejunum. No evidence of bowel obstruction. Visualized lung bases clear. IMPRESSION: Gastrojejunostomy appears to be in expected and appropriate position. Injected contrast is in the stomach. Electronically Signed   By: Rolm Baptise M.D.   On: 05/05/2020 17:53   DG CHEST PORT 1 VIEW  Result Date: 04/27/2020 CLINICAL DATA:  Shortness of breath this morning. EXAM: PORTABLE CHEST 1 VIEW COMPARISON:  May 28, 2010 and CT of the chest from November of 2021. Plain film of April 25, 2020 the abdomen. FINDINGS: Gastric tube courses below the carina into the upper abdomen and as expected, side port likely below GE junction, near the level of the EG junction. RIGHT-sided Port-A-Cath at the caval to atrial junction extending into upper RIGHT atrium. Heart size stable enlarged. RIGHT lower lobe airspace disease partially obscures the RIGHT hemidiaphragm. Streaky opacities at the LEFT lung base over the LEFT hemidiaphragm. On limited assessment no acute skeletal process. IMPRESSION: 1. RIGHT lower lobe airspace disease suspicious for pneumonia potentially associated with small effusion. 2. Suspected LEFT lower lobe atelectasis versus early infection in this area. 3. Cardiomegaly. 4. Patient's gastrojejunostomy tube is not well seen on the current study. Electronically Signed   By: Zetta Bills M.D.   On: 04/27/2020 11:15   DG  ABD ACUTE 2+V W 1V CHEST  Result Date: 04/24/2020 CLINICAL DATA:  Abdominal pain EXAM: DG ABDOMEN ACUTE WITH 1 VIEW CHEST COMPARISON:  Chest CT February 17, 2020; abdominal radiograph February 15, 2020 FINDINGS: AP chest: Port-A-Cath tip is at the cavoatrial junction. No pneumothorax. There is mild atelectasis in the right base. Lungs otherwise are clear. Heart size and pulmonary vascularity are normal. No adenopathy. There is aortic atherosclerosis. Supine and left lateral decubitus abdomen: Gastrostomy catheter  in region of stomach. No bowel dilatation or air-fluid level to suggest bowel obstruction. No free air. There are surgical clips in right upper quadrant. IMPRESSION: Gastrostomy catheter in region of stomach. No bowel dilatation or air-fluid level to suggest bowel obstruction. No free air. Surgical clips right upper quadrant. Slight right base atelectasis. No edema or airspace opacity. Port-A-Cath tip in superior vena cava. Aortic Atherosclerosis (ICD10-I70.0). Electronically Signed   By: Lowella Grip III M.D.   On: 04/24/2020 10:15   CT MAXILLOFACIAL WO CONTRAST  Result Date: 04/27/2020 CLINICAL DATA:  TMJ pain or limited movement. Bilateral parotid swelling. EXAM: CT MAXILLOFACIAL WITHOUT CONTRAST TECHNIQUE: Multidetector CT imaging of the maxillofacial structures was performed. Multiplanar CT image reconstructions were also generated. COMPARISON:  None. FINDINGS: Osseous: Negative for fracture. No acute skeletal abnormality. TMJ normal bilaterally Orbits: Bilateral cataract extraction.  No orbital mass or edema. Sinuses: Near complete opacification left sphenoid sinus with bony thickening consistent with chronic obstruction. Mild expansion compatible with mucocele. Remaining sinuses clear. Mastoid clear. Soft tissues: Prominent parotid tissue bilaterally right greater than left. No acute edema or mass. No parotid stone. Limited intracranial: Negative IMPRESSION: Prominent parotid bilaterally right greater than left without edema or mass. Left sphenoid sinus mucocele. Normal TMJ bilaterally Electronically Signed   By: Franchot Gallo M.D.   On: 04/27/2020 12:27    ASSESSMENT AND PLAN: 1.  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  GJ tube malfunction, s/p exchange today  3.  Abdominal pain due to malignancy 4.  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  Protein  calorie malnutrition 6.  Hypertension  -The patient's palliative GJ-tube was exchanged by IR today  -she has had multiple hospital admission for [place] tube malfunction, with significant residual food in stomach -I strongly suggest pt to have oral clear liquid only, with small amount, no solid or soft food, to avoid GJ tube malfunction. I reviewed with pt and her daughter [name] today, [name] agrees to watch what she eats at home. I re-assured her that she will get nutrition through J-tube  -pt will go home and resume home hospice  -Her overall condition is deteriorating, she has mild delirium.  Her overall prognosis is poor.  -continue supportive care -I spoke with Dr. Loleta Books this morning.    LOS: 5 days   Truitt Merle, MD  05/10/20

## 2020-05-10 NOTE — Progress Notes (Signed)
[facility] PROGRESS NOTE    TRIVIA HEFFELFINGER  MOQ:[id number] DOB: [birth year] DOA: 05/05/2020 PCP: Lattie Haw, MD      Brief Narrative:  [name] is a 75 y.o. 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  In the ER, noted to have Hgb 8 from preivous 11 just a week or two before.  Admitted for GI consultation.             Assessment & Plan:  Malfunction of GJ tube S/p upsizing of gastrostomy tube Dual port (gastic venting port; jejunal port) gastrostomy tube in place  On admission, this was evaluated by IR. She was observed to have leakage around the tube.  It was noted that the cuff was not snug to skin and patient and daughter indicated the gastric port as the port they were using for tube feeds.    To me today, the daughter is emphatic that she used the correct port for all feeds.  Of note, while here, the patient was observed for four days and had minimal leakage around her gastrostomy tube.    -Patient may taste food for comfort -She must vent her gastric port after tasting food  -All tube feeds and free water MUST be through jejunal port  -Would appreciate Hospice support in reinforcing this concept    New GJ tube placed today.   -Observe overnight, may use port tomorrow, and if no problems, d/c tomorrow after     Acute blood loss anemia, possibly due to duodenal ulcers Hgb now stable.  EGD completed, no stigmata of recent bleeding. -Continue PPI BID    Cholangiocarcinoma, metastatic to liver Diagnosed 2014 Partial hepatectomy in 2016 due to localized disease.   Aborted chemo in 2017 due to intolerance. Recurrence in liver in 2019, biopsy confirmed at [facility] Slow progression 2019-2020 Reinitiated chemotherapy locally with Dr. Burr Medico in 2020, again had to abort chemo in Apr 2021 due to intolerance Nov  2021 admitted with vomiting, found to have extrinsic compression of duodenum by tumor, not total obstruction; GJ tube placed     Gastric outlet obstruction On EGD last Nov, it appeared the extrinsic compression from the patient's cancer was leading to vomiting, but was not strictly obstructing the gastric outlet.  She was on tube feeds, and also seems to have been told she could have some oral intake.  At this point, although the obstruction still appears "moderate" during endoscopy, it is obviously clinically causing a severe gastric outlet obstruction (as evidenced by her worsening symptoms, leaking around her tube, and the presence of food contents in her stomach on EGD yesterday, which must have been present since before admission several days prior).   -At this point, I recommend oral intake be conceived only as "tasting" and be accompanied by venting  -Consult dietitian for tube feed recommendations prior to discharge  -Continue tube feeds via J tube ONLY   Diabetes Glucoses normal here.  Hypertension BP normal here off meds -Continue PRN hyrdalazine -Hold home lisinopril  Hypokalemia -Patient refused all potassium supplementation repeatedly  Thrombocytopenia Anemia, acute blood loss on anemia of chronic disease Platelets coming up somewhat, hemoglobin stable, no clinical bleeding.  COPD No active disease  Hypophosphatemia Repleted  Stage III pressure injury, sacrum, present on arrival -Wound care consult, appreciate cares   Pneumonia, likely aspiration, Right lower lobe Diagnosed last hospital stay.  Completed 7 days antibiotics. -Stop Augmentin          Disposition: Status is: Inpatient  Remains inpatient appropriate because:unconfirmed plan for J tube, unclear plans for diet   Dispo: The patient is from: Home              Anticipated d/c is to: Home              Anticipated d/c date is: 1 day              Patient currently is not medically stable  to d/c.   Difficult to place patient No       Level of care: Med-Surg       MDM: The below labs and imaging reports were reviewed and summarized above.  Medication management as above.     DVT prophylaxis: Place and maintain sequential compression device Start: 05/06/20 1438 SCDs Start: 05/05/20 1957  Code Status: DNR Family Communication: Daughter [name] by phone         Subjective: Patient is irritable due to her impression of findings on her EGD yesterday.  She is somewhat difficult to understand what exactly she thinks the EGD showed.  No fever, no vomiting, no hematemesis.  No leakage around the tube.  No dyspnea or cough.       Objective: Vitals:   05/09/20 1500 05/09/20 2127 05/10/20 0629 05/10/20 1323  BP: (!) 158/58 (!) 143/73 (!) 156/75 (!) 158/60  Pulse: (!) 59 78 86 73  Resp: 15 17 18 17   Temp: (!) 97.5 F (36.4 C) 98 F (36.7 C) 97.8 F (36.6 C) (!) 97.5 F (36.4 C)  TempSrc: Oral Oral Oral Oral  SpO2: 100% 100% 100%   Weight:      Height:        Intake/Output Summary (Last 24 hours) at 05/10/2020 2054 Last data filed at 05/10/2020 1742 Gross per 24 hour  Intake 1527.17 ml  Output 0 ml  Net 1527.17 ml   Filed Weights   05/05/20 1557 05/09/20 1243  Weight: 54.3 kg 54.3 kg    Examination: General appearance: Chronically ill-appearing adult female, alert and in no acute distress.  Sitting up in the recliner. HEENT: Anicteric, conjunctiva pink, lids and lashes normal. No nasal deformity, discharge, epistaxis.  Lips moist, edentulous, oropharynx tacky dry, no oral lesions, hearing normal.   Skin: Warm and dry.  No jaundice.  No suspicious rashes or lesions. Cardiac: RRR, nl S1-S2, no murmurs appreciated.  Capillary refill is brisk.  JVP not visible.  Mild bilateral 1+ pittingLE edema.  Radial pulses 2+ and symmetric. Respiratory: Normal respiratory rate and rhythm.  CTAB without rales or wheezes. Abdomen: Abdomen soft.  No TTP or  guarding.  No leakage around her gastrostomy. No ascites, distension, hepatosplenomegaly.   MSK: No deformities or effusions. Neuro: Awake and alert.  EOMI, moves all extremities. Speech fluent.    Psych: Sensorium intact and responding to questions, attention normal. Affect irritable.  Judgment and insight appear tangential.    Data Reviewed: I have personally reviewed following labs and imaging studies:  CBC: Recent Labs  Lab 05/05/20 1756 05/05/20 2021 05/06/20 0530 05/06/20 0750 05/06/20 1620 05/08/20 0316 05/10/20 0306  WBC 5.6   < > 4.3 4.4 4.6 4.2 4.6  NEUTROABS 4.7  --   --   --   --   --   --   HGB 8.9*   < > 8.3* 8.6* 8.7* 8.2* 8.6*  HCT 27.0*   < >  25.5* 26.2* 26.9* 25.5* 26.7*  MCV 93.8   < > 95.5 94.2 95.4 96.6 95.7  PLT 102*   < > 95* 95* 108* 101* 117*   < > = values in this interval not displayed.   Basic Metabolic Panel: Recent Labs  Lab 05/05/20 1756 05/06/20 0530 05/07/20 0417 05/08/20 0316 05/09/20 0903 05/10/20 0306  NA 140 140 139 139 139 139  K 3.9 3.7 3.1* 3.4* 3.3* 2.9*  CL 106 109 108 109 108 107  CO2 24 25 25 25 24 24   GLUCOSE 107* 189* 129* 124* 111* 119*  BUN 18 20 15 11 8  7*  CREATININE 0.58 0.53 0.50 0.51 0.48 0.52  CALCIUM 8.8* 8.4* 8.2* 8.0* 8.2* 8.0*  MG 2.1  --  1.9  --   --   --   PHOS  --   --  2.4*  --   --   --    GFR: Estimated Creatinine Clearance: 51.8 mL/min (by C-G formula based on SCr of 0.52 mg/dL). Liver Function Tests: Recent Labs  Lab 05/06/20 0530  AST 47*  ALT 53*  ALKPHOS 169*  BILITOT 0.4  PROT 4.6*  ALBUMIN 2.0*   No results for input(s): LIPASE, AMYLASE in the last 168 hours. No results for input(s): AMMONIA in the last 168 hours. Coagulation Profile: No results for input(s): INR, PROTIME in the last 168 hours. Cardiac Enzymes: No results for input(s): CKTOTAL, CKMB, CKMBINDEX, TROPONINI in the last 168 hours. BNP (last 3 results) No results for input(s): PROBNP in the last 8760  hours. HbA1C: No results for input(s): HGBA1C in the last 72 hours. CBG: Recent Labs  Lab 05/10/20 0425 05/10/20 0745 05/10/20 1155 05/10/20 1624 05/10/20 2010  GLUCAP 96 114* 120* 127* 114*   Lipid Profile: No results for input(s): CHOL, HDL, LDLCALC, TRIG, CHOLHDL, LDLDIRECT in the last 72 hours. Thyroid Function Tests: No results for input(s): TSH, T4TOTAL, FREET4, T3FREE, THYROIDAB in the last 72 hours. Anemia Panel: Recent Labs    05/09/20 0903  VITAMINB12 1,054*  FOLATE 4.4*  FERRITIN 99  TIBC 164*  IRON 44  RETICCTPCT 4.7*   Urine analysis:    Component Value Date/Time   COLORURINE AMBER (A) 02/14/2020 1112   APPEARANCEUR HAZY (A) 02/14/2020 1112   LABSPEC 1.027 02/14/2020 1112   PHURINE 5.0 02/14/2020 1112   GLUCOSEU NEGATIVE 02/14/2020 1112   HGBUR NEGATIVE 02/14/2020 1112   BILIRUBINUR NEGATIVE 02/14/2020 1112   BILIRUBINUR negative 01/04/2013 1530   KETONESUR 5 (A) 02/14/2020 1112   PROTEINUR 100 (A) 02/14/2020 1112   UROBILINOGEN 0.2 10/09/2013 2037   NITRITE NEGATIVE 02/14/2020 1112   LEUKOCYTESUR NEGATIVE 02/14/2020 1112   Sepsis Labs: @LABRCNTIP (procalcitonin:4,lacticacidven:4)  ) Recent Results (from the past 240 hour(s))  SARS Coronavirus 2 by RT PCR (hospital order, performed in [place] hospital lab) Nasopharyngeal Nasopharyngeal Swab     Status: None   Collection Time: 05/05/20 10:05 PM   Specimen: Nasopharyngeal Swab  Result Value Ref Range Status   SARS Coronavirus 2 NEGATIVE NEGATIVE Final    Comment: (NOTE) SARS-CoV-2 target nucleic acids are NOT DETECTED.  The SARS-CoV-2 RNA is generally detectable in upper and lower respiratory specimens during the acute phase of infection. The lowest concentration of SARS-CoV-2 viral copies this assay can detect is 250 copies / mL. A negative result does not preclude SARS-CoV-2 infection and should not be used as the sole basis for treatment or other patient management decisions.  A negative  result may occur with  improper specimen collection / handling, submission of specimen other than nasopharyngeal swab, presence of viral mutation(s) within the areas targeted by this assay, and inadequate number of viral copies (<250 copies / mL). A negative result must be combined with clinical observations, patient history, and epidemiological information.  Fact Sheet for Patients:   StrictlyIdeas.no  Fact Sheet for Healthcare Providers: BankingDealers.co.za  This test is not yet approved or  cleared by the [place] FDA and has been authorized for detection and/or diagnosis of SARS-CoV-2 by FDA under an Emergency Use Authorization (EUA).  This EUA will remain in effect (meaning this test can be used) for the duration of the COVID-19 declaration under Section 564(b)(1) of the Act, 21 U.S.C. section 360bbb-3(b)(1), unless the authorization is terminated or revoked sooner.  Performed at [facility], [place] [address] [place], [place] [address]          Radiology Studies: No results found.      Scheduled Meds: . Chlorhexidine Gluconate Cloth  6 each Topical Daily  . ondansetron (ZOFRAN) IV  4 mg Intravenous Once  . [START ON 05/11/2020] pantoprazole sodium  40 mg Per J Tube BID   Continuous Infusions: . dextrose 5 % and 0.9 % NaCl with KCl 20 mEq/L 50 mL/hr at 05/10/20 1741  . [START ON 05/11/2020] feeding supplement (OSMOLITE 1.5 CAL)       LOS: 5 days    Time spent: 90 minutes    Edwin Dada, MD [facility] 05/10/2020, 8:54 PM     Please page though [place] or Epic secure chat:  For [employer], contact charge nurse     Estimated body mass index is 21.21 kg/m as calculated from the following:   Height as of this encounter: 5' 3"  (1.6 m).   Weight as of this encounter: 54.3 kg. Malnutrition Type: Nutrition Problem: Moderate Malnutrition Etiology: cancer and cancer  related treatments,chronic illness Malnutrition Characteristics: Signs/Symptoms: percent weight loss,moderate fat depletion,moderate muscle depletion Nutrition Interventions: Interventions: Tube feeding    . Chlorhexidine Gluconate Cloth  6 each Topical Daily  . ondansetron (ZOFRAN) IV  4 mg Intravenous Once  . [START ON 05/11/2020] pantoprazole sodium  40 mg Per J Tube BID   . dextrose 5 % and 0.9 % NaCl with KCl 20 mEq/L 50 mL/hr at 05/10/20 1741  . [START ON 05/11/2020] feeding supplement (OSMOLITE 1.5 CAL)      camphor-menthol, hydrALAZINE, HYDROmorphone, iohexol, lidocaine, LORazepam, ondansetron **OR** ondansetron (ZOFRAN) IV, sodium chloride flush   Current Meds  Medication Sig  . albuterol (PROAIR HFA) 108 (90 Base) MCG/ACT inhaler Inhale 1-2 puffs into the lungs every 6 (six) hours as needed for wheezing or shortness of breath.  [name] amoxicillin-clavulanate (AUGMENTIN) 875-125 MG tablet Take 1 tablet by mouth 2 (two) times daily. Start date: 05/02/20  . dexamethasone (DECADRON) 4 MG tablet Take 1 tablet (4 mg total) by mouth 2 (two) times daily with a meal.  . dicyclomine (BENTYL) 20 MG tablet Take 1 tablet (20 mg total) by mouth 2 (two) times daily as needed for spasms (abdominal pain). (Patient taking differently: Take 20 mg by mouth 2 (two) times daily as needed for spasms.)  . famotidine (PEPCID) 20 MG tablet Take 1 tablet (20 mg total) by mouth 2 (two) times daily. (Patient taking differently: Take 20 mg by mouth 2 (two) times daily as needed for heartburn.)  . fluticasone (FLONASE) 50 MCG/ACT nasal spray Place 2 sprays into both nostrils daily as needed for rhinitis.  [name]  HYDROmorphone (DILAUDID) 2 MG tablet Take 1 tablet (2 mg total) by mouth every 4 (four) hours as needed for severe pain.  [name] lisinopril (ZESTRIL) 40 MG tablet Place 1 tablet (40 mg total) into feeding tube daily. (Patient taking differently: Take 40 mg by mouth daily.)  . LORazepam (ATIVAN) 0.5 MG tablet Take 0.5 mg  by mouth every 8 (eight) hours as needed for anxiety.  . Nutritional Supplements (FEEDING SUPPLEMENT, OSMOLITE 1.2 CAL,) LIQD Place 60 mL/hr into feeding tube continuous. (Patient taking differently: Place 60 mL/hr into feeding tube 2 (two) times daily as needed (nutritional supplement).)  . Water For Irrigation, Sterile (FREE WATER) SOLN Place 100 mLs into feeding tube every 4 (four) hours.

## 2020-05-10 NOTE — Progress Notes (Addendum)
[facility]) Hospitalized Hospice Patient Note  Bonnie Peterson is an Bonnie Peterson hospice patient admittedto hospice services on10/7/21 with a terminal diagnosis of intrahepatic cholangiocarcinoma.Patient was recently hospitalized and had a g tube placed, she was discharged home and continued to have issues related to drainage and diarrhea. She was evaluated by interventional radiology on Thursday, and found no significant source of her continued issues with her g-tube. She returned home, the drainage worsened and per her daughter and caregiver Bonnie Peterson, had become bloody as well. She returned to the ED on 1/28, and is admitted with suspected GI bleed. Hospice was made aware of her going to the ED. This is a related hospital admission per Dr. Konrad Dolores, physician for [facility].  Visited a patient at the bedside, Bonnie Peterson is alert and oriented to person, place and time but is speaking of circumstances that did not occur and is intermittently hallucinating. She was in pain during this visit and required IV pain medication to help manage this.  She had her upper GI yesterday, revealed esophagitis and no active upper GI bleed. Plan is to d/c home as soon as radiology weighs in to determine if a trade out of her g-tube is needed.   V/S: 97.8 oral, 156/75, HR 86, RR 18, SPO2 100% on RA Labs: K 2.9, BUN 7, RBC 2.70, hgb 8.6, HCT 26.7, plt 117 IVs/PRNs: protonix 40 mg IV BID, D5NS @ 40 ml/hr (stopped at 10 am), dilaudid 0.5 mg IV X 1, potassium chloride 10 meEq runs IV x 5  Problem List: - Suspected GI bleed- hgb dropped 3 points over the last week, initially patient refused further work up for this in the ED. Upper GI on 2/1 revealed esophagitis and no active bleed. Hgb 8.2 > 8.6 today. Petechial hemorrhages noted to upper thighs and arms.  - G-tube dysfunction - She has not been tolerating enteral nutrition, states she gets full. Attempted to discuss that enteral nutrition is not mandatory  and they could focus on what she could tolerate by mouth. They are not ready for this discussion even though it may provide more comfort. Plan to go to interventional radiology today to evaluate further.  - Thrombocytopenia - this has been her baseline from some time and felt to be secondary to her malignancy, was 89 > 101 > 117 today, petechial rash noted to bilateral upper thighs and arms. No other evidence of frank bleeding.  - Intrahepatic cholangiocarcinoma- on hospice at home for this, however they are discussing stopping hospice services as they feel that they are not being treated due to "being with hospice".   GOC: ongoing, during last hospitalization patient and dtr adamant about not returning to hospital. Continues to have leakage, which is upsetting to the patient. Bonnie Peterson understands that her mother is nearing the end of her life. She does want her to be treated for her leaking G-tube and feels this is a reasonable request. The leaking g-tube is the only thing that the patient and the family are concerned about today. Encouraged them to look at one problem, the g-tube, as opposed to multi-system issues. Nothing can be done about her malignancy and her hematological issues.  D/C planning: return home with hospice support IDT: hospice team updated Family: Present in room  [place], BSN, [place] [facility] Liaison

## 2020-05-10 NOTE — Progress Notes (Signed)
Chaplain engaged in follow-up visit with Bonnie Peterson who was tearful this morning.  She expressed again having some distrust and discomfort around those providing her medical care.  [name] and [name] have a lot of questions surrounding [name]'s health.  They are looking for answers.    Chaplain discussed with nurse the possibility of inputting a palliative care consult.  Chaplain believes that palliative care would be a great resource in relaying and conveying medical information to [name] with a presence of care that they truly do need and are asking to receive.  Chaplain also provided education around Scientist, physiological.  Chaplain  is working to have it completed by obtaining a Patent examiner and witnesses.  Chaplain will continue to follow-up.   05/10/20 1000  Clinical Encounter Type  Visited With Patient and family together  Visit Type Initial;Spiritual support;Social support

## 2020-05-10 NOTE — Procedures (Signed)
Interventional Radiology Procedure Note  Procedure: Gastrojejunostomy tube exchange  Findings: Please refer to procedural dictation for full description. Successful exchange and upsize of indwelling 20 Fr gastrojejunostomy to 26 Fr gastrojejunostomy.  Jejunal arm tip in proximal jejunum.    Complications: None immediate  Estimated Blood Loss: < 5 mL  Recommendations: OK to use tube immediately as needed.  Hopeful for less leaking due to larger diameter tube and balloon retention versus rubber bumper on prior tube.   Ruthann Cancer, MD Pager: [phone]

## 2020-05-11 LAB — GLUCOSE, CAPILLARY
Glucose-Capillary: 103 mg/dL — ABNORMAL HIGH (ref 70–99)
Glucose-Capillary: 105 mg/dL — ABNORMAL HIGH (ref 70–99)
Glucose-Capillary: 107 mg/dL — ABNORMAL HIGH (ref 70–99)

## 2020-05-11 LAB — CBC
HCT: 24.2 % — ABNORMAL LOW (ref 36.0–46.0)
Hemoglobin: 7.7 g/dL — ABNORMAL LOW (ref 12.0–15.0)
MCH: 30.7 pg (ref 26.0–34.0)
MCHC: 31.8 g/dL (ref 30.0–36.0)
MCV: 96.4 fL (ref 80.0–100.0)
Platelets: 107 10*3/uL — ABNORMAL LOW (ref 150–400)
RBC: 2.51 MIL/uL — ABNORMAL LOW (ref 3.87–5.11)
RDW: 17.3 % — ABNORMAL HIGH (ref 11.5–15.5)
WBC: 3.9 10*3/uL — ABNORMAL LOW (ref 4.0–10.5)
nRBC: 0 % (ref 0.0–0.2)

## 2020-05-11 LAB — BASIC METABOLIC PANEL
Anion gap: 7 (ref 5–15)
BUN: 6 mg/dL — ABNORMAL LOW (ref 8–23)
CO2: 24 mmol/L (ref 22–32)
Calcium: 7.8 mg/dL — ABNORMAL LOW (ref 8.9–10.3)
Chloride: 106 mmol/L (ref 98–111)
Creatinine, Ser: 0.51 mg/dL (ref 0.44–1.00)
GFR, Estimated: >60 mL/min (ref >60)
Glucose, Bld: 117 mg/dL — ABNORMAL HIGH (ref 70–99)
Potassium: 3.3 mmol/L — ABNORMAL LOW (ref 3.5–5.1)
Sodium: 137 mmol/L (ref 135–145)

## 2020-05-11 MED ORDER — DICYCLOMINE HCL 20 MG PO TABS: 20.0000 mg | ORAL_TABLET | Freq: Two times a day (BID) | ORAL | 0 refills | Status: AC | PRN

## 2020-05-11 MED ORDER — HYDROMORPHONE HCL 2 MG PO TABS: 2.0000 mg | ORAL_TABLET | ORAL | 0 refills | Status: AC | PRN

## 2020-05-11 MED ORDER — HEPARIN SOD (PORK) LOCK FLUSH 100 UNIT/ML IV SOLN
500.0000 [IU] | INTRAVENOUS | Status: AC | PRN
  Administered 2020-05-11: 500 [IU]
  Filled 2020-05-11: qty 5

## 2020-05-11 MED ORDER — FAMOTIDINE 20 MG PO TABS: 20.0000 mg | ORAL_TABLET | Freq: Two times a day (BID) | ORAL | 2 refills | Status: AC | PRN

## 2020-05-11 MED ORDER — OSMOLITE 1.5 CAL PO LIQD: ORAL | 0 refills | Status: AC

## 2020-05-11 MED ORDER — PANTOPRAZOLE SODIUM 40 MG PO PACK: 20.0000 mg | PACK | Freq: Every day | ORAL | 2 refills | Status: AC | PRN

## 2020-05-11 NOTE — Progress Notes (Signed)
Assessment unchanged. Instructed daughter, [name], how to changed dressing at PEG site as well as clarified J-tube for feeding and G-tube for venting with verbalized understanding & Pt demonstrates understanding. Also instructed [name] how to program Kangaroo Pump. Hospice RN in to see pt before leaving, clarified feeding and flushes. I asked the RN to have whomever (nurse) goes out to the home to reiterate the pump tutorial. All other dc instructions were received with verbalized understanding including Medications to resume and those to stop per Dr. Loleta Books. Hospice to be at home today. Discharged via wc to front entrance accompanied by daughter and NT.

## 2020-05-11 NOTE — Progress Notes (Signed)
Nutrition Follow-up  DOCUMENTATION CODES:   Non-severe (moderate) malnutrition in context of chronic illness  INTERVENTION:   Goal rate for discharge: -Osmolite 1.5 @ 45 ml/hr via J-tube -Free water flushes of 100 ml every 4 hours (600 ml)  -Provides 1620 kcals, 67g protein and 1422 ml H2O.  NUTRITION DIAGNOSIS:   Moderate Malnutrition related to cancer and cancer related treatments,chronic illness as evidenced by percent weight loss,moderate fat depletion,moderate muscle depletion.  Ongoing.  GOAL:   Patient will meet greater than or equal to 90% of their needs  Will meet at goal rate.  MONITOR:   Labs,Weight trends,TF tolerance,I & O's  ASSESSMENT:   75 year old lady with prior history of metastatic cholangiocarcinoma s/p left hepatectomy currently on hospice care, esophagitis, GERD, diabetes, COPD, GJ tube by radiology admitted for nonfunctioning/leaking of GJ tube, sacral decubitus ulcer, obstructive sleep apnea admitted for GJ tube malfunction.  2/1: s/p EGD 2/2: s/p G-J tube exchange in IR, J-tube tip confirmed in proximal jejunum  MD reached out to RD to verify J-tube feed recommendations. Verified Osmolite 1.5 @ 45 ml/hr via J-tube for discharge. Pt to go home with hospice.  Pt is now utilizing G-port for venting purposes and is allowed clear liquids for comfort. Noted report of pt eating ice cream yesterday which may be r/t nausea.   Admission weight: 119 lbs.  Medications: D5 infusion, IV Zofran  Labs reviewed: CBGs: 103-105 Low K  Diet Order:   Diet Order            Diet clear liquid Room service appropriate? Yes; Fluid consistency: Thin  Diet effective now                 EDUCATION NEEDS:   Education needs have been addressed  Skin:  Skin Assessment: Reviewed RN Assessment  Last BM:  1/30  Height:   Ht Readings from Last 1 Encounters:  05/09/20 5' 3"  (1.6 m)    Weight:   Wt Readings from Last 1 Encounters:  05/09/20 54.3 kg    BMI:  Body mass index is 21.21 kg/m.  Estimated Nutritional Needs:   Kcal:  1650-1850  Protein:  75-90g  Fluid:  2L/day  Clayton Bibles, MS, RD, LDN Inpatient Clinical Dietitian Contact information available via Amion

## 2020-05-11 NOTE — Progress Notes (Signed)
Chaplain was able to have Advanced Directive completed.  Chaplain gave Bonnie Peterson five copies and gave the unit secretary a copy for her chart.  She has assigned her daughter Bonnie Peterson as her healthcare POA.  Bonnie Peterson appears to be in better spirits today and is feeling better.  Chaplain will follow-up as needed.    05/11/20 1200  Clinical Encounter Type  Visited With Patient and family together  Visit Type Social support

## 2020-05-11 NOTE — TOC Progression Note (Signed)
Transition of Care [facility]) - Progression Note    Patient Details  Name: JOSANNE BOEREMA MRN: [id number] Date of Birth: 1945-07-10  [facility]) CM/SW [place], [place] Phone Number: 05/11/2020, 12:51 PM  Clinical Narrative:    Hospice is coordinating discharge plan home with tube feedings.Nurse to provide teaching to the daughter and pt. before d/c.  No other needs identified.   Expected Discharge Plan: Home w Hospice Care Barriers to Discharge: Barriers Resolved  Expected Discharge Plan and Services Expected Discharge Plan: [place] In-house Referral: Hospice / [place] Acute Care Choice: Hospice Living arrangements for the past 2 months: Single Family Home Expected Discharge Date: 05/11/20                           [place] Date [facility] Agency Contacted: 05/11/20 Time [place]: 1028 Representative spoke with at [place]: [place] Determinants of Health ([place]) Interventions    Readmission Risk Interventions Readmission Risk Prevention Plan 05/11/2020  Transportation Screening Complete  Medication Review Press photographer) Complete  PCP or Specialist appointment within 3-5 days of discharge Complete  HRI or [place] Not Complete  SW Recovery Care/Counseling Consult Not Complete  Palliative Care Screening Complete  [place] Not Applicable  Some recent data might be hidden

## 2020-05-11 NOTE — Progress Notes (Signed)
Dr. Loleta Books at bedside. Instructed pt to open G-port of feeding tube and vent as pt has done in past. Pt demonstrated understanding and vented 150 ml gastric contents. Pt also verbalized to nurse she knew the J-port was for feeding and G-port was for venting. Reassured. Denied nausea.

## 2020-05-11 NOTE — Discharge Summary (Signed)
Physician Discharge Summary  Bonnie Peterson RVU:[id number] DOB: 09-10-1945 DOA: 05/05/2020  PCP: Lattie Haw, MD  Admit date: 05/05/2020 Discharge date: 05/11/2020  Admitted From: Home  Disposition:  Home with Hospice     Equipment/Devices: New feeding tube    CODE STATUS: DNR Diet recommendation: Only Osmolite per J-tube  Brief/Interim Summary: Bonnie Peterson is a 75 y.o. 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  In the ER, noted to have Hgb 8 from preivous 11 just a week or two before.  Admitted for GI consultation.        PRINCIPAL HOSPITAL DIAGNOSIS: J-tube malfunction    Discharge Diagnoses:   Malfunction of GJ tube S/p upsizing of gastrostomy tube with dual port (gastic venting port; jejunal port) gastrostomy tube in place Admitted and IR consulted.  While in hospital, no leaking around tube, despite use of J-tube for tube feeds and medications, when allowed by patient.  IR took patient to Radiology lab and removed old port and placed 26Fr port with balloon.  Patient was recommended repeatedly in the last 24 hours to take medication and nutrition through J-tube to test prior to discharge, and repeatedly refused.  -Patient may taste food for comfort -She must vent her gastric port after tasting food  -All tube feeds and free water MUST be through jejunal port      Acute blood loss anemia, possibly due to duodenal ulcers Admitted and observed.  Hgb stable.  No clinical bleeding.  Udnerwent EGD that showed duodenal ulcers but no stigmata of recent bleeding.   Continue PPI and famotidine at discharge.    Cholangiocarcinoma, metastatic to liver Diagnosed 2014 Partial hepatectomy in 2016 due to localized disease.   Aborted chemo in 2017 due to intolerance. Recurrence in liver in 2019, biopsy confirmed at [facility] Slow  progression 2019-2020 Reinitiated chemotherapy locally with Dr. Burr Medico in 2020, again had to abort chemo in Apr 2021 due to intolerance Nov 2021 admitted with vomiting, found to have extrinsic compression of duodenum by tumor, not total obstruction; GJ tube placed     Gastric outlet obstruction On EGD last Nov, it appeared the extrinsic compression from the patient's cancer was leading to vomiting, but was not strictly obstructing the gastric outlet.  She was on tube feeds, and also seems to have been told she could have some oral intake.  At this point, although the obstruction still appears "moderate" during endoscopy, it is obviously clinically causing gastric outlet obstruction and symptoms with any oral or gastric intake (as evidenced by her worsening symptoms, leaking around her tube, and the presence of food contents in her stomach on EGD yesterday, which must have been present since before admission several days prior).   At this point, Oncology and I recommend oral intake be conceived only as "tasting" and be accompanied by venting, and that all medication and nutrition and fluids be directed through Jejunal tube    Diabetes  Hypertension BP normal here off meds  Hold home lisinopril  Hypokalemia Patient refused all potassium supplementation   Thrombocytopenia Anemia, acute blood loss on anemia of chronic disease Platelets improving. Hemoglobin stable, no clinical bleeding.  COPD No active disease  Hypophosphatemia Repleted  Stage III pressure injury, sacrum, present on arrival  Pneumonia, likely aspiration, Right lower lobe Diagnosed last hospital stay.  Completed 7 days antibiotics.  Discharge Instructions  Discharge Instructions    Discharge instructions   Complete by: As directed    Remember, the port for all tube feeds and water and medicine is the JEJUNUM port.  The port to open and drain out any food or drink that you  tasted is the GASTRIC port.  At this point, it seems that anything you eat or drink by mouth just sits in the stomach.  For that reason, avoid eating anything solid, because there's no way to get that out of the gastric port.  You would have to throw it up as vomit.  Liquids and things like ice cream that dissolve you can take by mouth, but remember, you aren't "Eating" it, because it can't pass your stomach.  You are just tasting it, and once you taste it you have to open the Gastric port to let it back out.    For all nutrition: Take Osmolite 1.5 liquid through the J port at 45 ml/hr OR  Take Osmolite 1.2 liquid at 60 ml/hr  You can use one or the other, whichever you have at home and whichever feels better  Add plain water (100 mL) every four hours   For heart burn: take famotidine (Pepcid) or pantoprazole (Protonix) whichever feels better  You can stop the Augmentin and Decadron, you finished these  You can stop lisinopril if you want, this blood pressure medicine is not helpful anymore   Increase activity slowly   Complete by: As directed      Allergies as of 05/11/2020      Reactions   Propofol Other (See Comments)   "It makes me feel like I'm on fire."      Medication List    STOP taking these medications   amoxicillin-clavulanate 875-125 MG tablet Commonly known as: AUGMENTIN   dexamethasone 4 MG tablet Commonly known as: DECADRON   lisinopril 40 MG tablet Commonly known as: ZESTRIL     TAKE these medications   albuterol 108 (90 Base) MCG/ACT inhaler Commonly known as: ProAir HFA Inhale 1-2 puffs into the lungs every 6 (six) hours as needed for wheezing or shortness of breath.   dicyclomine 20 MG tablet Commonly known as: BENTYL 1 tablet (20 mg total) by Per J Tube route 2 (two) times daily as needed for spasms (abdominal pain). What changed: how to take this   famotidine 20 MG tablet Commonly known as: PEPCID Place 1 tablet (20 mg total) into feeding  tube 2 (two) times daily as needed for heartburn. What changed:   how to take this  when to take this  reasons to take this   feeding supplement (OSMOLITE 1.5 CAL) Liqd Give Osmolite 1.5 at 45 ml/hr via J-tube What changed:   how much to take  how to take this  when to take this  additional instructions  how fast to infuse this   fluticasone 50 MCG/ACT nasal spray Commonly known as: FLONASE Place 2 sprays into both nostrils daily as needed for rhinitis.   free water Soln Place 100 mLs into feeding tube every 4 (four) hours.   HYDROmorphone 2 MG tablet Commonly known as: DILAUDID Place 1 tablet (2 mg total) into feeding tube every 4 (four) hours as needed for severe pain. What changed: how to take this   LORazepam 0.5 MG tablet Commonly known as: ATIVAN Take 0.5 mg by mouth every 8 (eight) hours as needed for anxiety.   pantoprazole sodium 40 mg/20 mL Pack Commonly known as: PROTONIX  Place 10 mLs (20 mg total) into feeding tube daily as needed (For heart burn or indigestion). What changed:   how much to take  when to take this  reasons to take this       Follow-up Information    [facility] Follow up.   Contact information: [place] [phone]             Allergies  Allergen Reactions  . Propofol Other (See Comments)    "It makes me feel like I'm on fire."    Consultations:  Oncology  Gastroenterology   Procedures/Studies: DG Abd 1 View  Result Date: 04/25/2020 CLINICAL DATA:  Abdominal distention. EXAM: ABDOMEN - 1 VIEW COMPARISON:  IR images 04/24/2020.  Abdomen 04/24/2020 FINDINGS: Interim placement of NG tube, its tip is over the stomach. Gastrojejunostomy catheter in stable position. Surgical clips right upper quadrant. Oral contrast noted what appears to be colon. No bowel distention. Degenerative change lumbar spine. IMPRESSION: 1. Interim placement of NG tube, its tip is over  the stomach. Gastrojejunostomy catheter in stable position. 2. Oral contrast noted in what appears to be colon. No bowel distention. Electronically Signed   By: Marcello Moores  Register   On: 04/25/2020 06:30   IR GJ Tube Change  Result Date: 05/11/2020 INDICATION: 75 year old female with history of intrahepatic cholangiocarcinoma and gastric outlet obstruction requiring percutaneous enteric access for nutritional purposes status post percutaneous gastrostomy tube placement on 02/21/2020 and jejunal arm placement on 04/24/2020. Since conversion to gastrojejunostomy, the patient complains of leaking and pain at the tube insertion site as well as fullness with feeding. EXAM: JEJUNAL CATHETER REPLACEMENT MEDICATIONS: None ANESTHESIA/SEDATION: Fentanyl 50 mcg IV Moderate Sedation Time:  0 minutes The patient was continuously monitored during the procedure by the interventional radiology nurse under my direct supervision. CONTRAST:  Ten mL Omnipaque 300-administered into the gastric lumen. FLUOROSCOPY TIME:  Fluoroscopy Time: 0 minutes 30 seconds (7 mGy). COMPLICATIONS: None immediate. PROCEDURE: Informed written consent was obtained from the patient after a thorough discussion of the procedural risks, benefits and alternatives. All questions were addressed. Maximal Sterile Barrier Technique was utilized including caps, mask, sterile gowns, sterile gloves, sterile drape, hand hygiene and skin antiseptic. A timeout was performed prior to the initiation of the procedure. The indwelling pull-through gastrostomy tube with indwelling jejunal arm appeared within normal limits externally. Scout radiograph prior to procedure demonstrated appropriate positioning. There was minimal, cloudy, opaque appearing drainage from around the tube insertion site with a dermatotomy significantly larger than the indwelling tube. Hand injection contrast the jejunal and gastric arms demonstrated appropriate positioning within the stomach and proximal  jejunum. No leakage of contrast along the indwelling tube to the skin surface was observed. Decision was made to upsize the indwelling tube in hopes of decreasing the amount of leakage around the tube insertion site. An exchange length stiff glidewire was inserted through the jejunal arm. The indwelling tube was removed without difficulty. Over the wire, a 25 French gastrojejunostomy tube was inserted. The tip was positioned in the proximal jejunum, approximately 20 cm beyond the ligament of Treitz. The retention balloon was inflated with 10 mL sterile water. Hand injection of contrast via the gastric and jejunal lumen confirmed appropriate positioning. The external skin bumper was cinched skin surface. A bandage was applied. The patient tolerated the procedure well was transferred back to the floor in good condition. IMPRESSION: Successful upsize of indwelling gastrojejunostomy from 70 French 26 [place], now balloon retention. The jejunal limb  is positioned in the proximal jejunum. Okay to resume tube feeds and medication administration immediately as needed. Ruthann Cancer, MD [facility] Radiology Electronically Signed   By: Ruthann Cancer MD   On: 05/11/2020 07:53   IR GASTR TUBE CONVERT GASTR-JEJ PER W/FL MOD SED  Result Date: 04/24/2020 INDICATION: History of metastatic cholangiocarcinoma, currently on hospice. Patient underwent image guided placement of a pull-through venting gastrostomy tube on 02/21/2020 for palliative purposes. Attempt was made at the time of gastrostomy tube placement to convert the gastrostomy tube to a gastrojejunostomy catheter however this was unsuccessful secondary to subtotal occlusion of the gastric antrum. Patient returns today with complaint of excessive pericatheter leakage and as such presents for fluoroscopic guided gastrostomy tube evaluation and management. EXAM: 1. FLUOROSCOPIC GUIDED CONVERSION OF EXISTING GASTROSTOMY TUBE  TO A GASTROJEJUNOSTOMY TUBE 2. FLUOROSCOPIC GUIDED PLACEMENT OF A NASOGASTRIC TUBE. COMPARISON:  Image guided pull-through gastrostomy tube placement-02/21/2020 MEDICATIONS: None. CONTRAST:  42m OMNIPAQUE IOHEXOL 300 MG/ML SOLN administered into the gastric lumen and proximal small bowel FLUOROSCOPY TIME:  22 minutes, 24 seconds (3384mGy) COMPLICATIONS: None. PROCEDURE: Informed written consent was obtained from the patient after a discussion of the risks and benefits. The upper abdomen and the external portion of the existing gastrostomy tube was prepped and draped in the usual sterile fashion, and a sterile drape was applied covering the operative field. Maximum barrier sterile technique with sterile gowns and gloves were used for the procedure. A timeout was performed prior to the initiation of the procedure. The gastrostomy tube was injected with a small amount of contrast demonstrating significant distension the stomach which is noted to be filled with a large amount of debris. Ultimately with prolonged diligent effort, a stiff glidewire was advanced through the subtotal occlusion of the gastric antrum with the use of a short Kumpe catheter. Under imaged fluoroscopic guidance, the Kumpe catheter was exchanged for a coaxial 9-French gastrojejunostomy catheter which was advanced over the stiff Glidewire with end ultimately co terminating within the proximal jejunum. Contrast injection demonstrated appropriate positioning and functionality of both the gastric and jejunal lumens. Given the significant amount of debris within the stomach as well as patient's presenting complaint of excessive pericatheter leakage, an 18 gauge nasogastric 2 was advanced through the left narrowings to the level of the stomach under intermittent fluoroscopic guidance after the left naris was anesthetized with viscous lidocaine. Postprocedural spot fluoroscopic images were obtained and the procedure was terminated. The nasogastric tube  was affixed at the nose with retention tape. Dressings were applied. The patient tolerated procedure well without immediate postprocedural complication. IMPRESSION: 1. Successful fluoroscopic guided conversion of pull-through gastrostomy tube to a gastrojejunostomy catheter with jejunal limb terminating within the proximal jejunum. 2. Successful fluoroscopic guided placement of an 171French nasogastric tube for the purposes of maximum gastric venting. PLAN: - Given the large amount of retained debris within the stomach, recommended the nasogastric tube stay in place for 1-2 days connected to low wall suction to allow for optimal gastric venting. - The jejunal lumen is ready for immediate use for feeding and the gastric lumen may be utilized for venting purposes as indicated. 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 Electronically Signed   By: JSandi MariscalM.D.   On: 04/24/2020 16:35   IR INTRO LONG GI TUBE  Result Date: 04/24/2020 INDICATION: History of metastatic  cholangiocarcinoma, currently on hospice. Patient underwent image guided placement of a pull-through venting gastrostomy tube on 02/21/2020 for palliative purposes. Attempt was made at the time of gastrostomy tube placement to convert the gastrostomy tube to a gastrojejunostomy catheter however this was unsuccessful secondary to subtotal occlusion of the gastric antrum. Patient returns today with complaint of excessive pericatheter leakage and as such presents for fluoroscopic guided gastrostomy tube evaluation and management. EXAM: 1. FLUOROSCOPIC GUIDED CONVERSION OF EXISTING GASTROSTOMY TUBE TO A GASTROJEJUNOSTOMY TUBE 2. FLUOROSCOPIC GUIDED PLACEMENT OF A NASOGASTRIC TUBE. COMPARISON:  Image guided pull-through gastrostomy tube placement-02/21/2020 MEDICATIONS: None. CONTRAST:  8m OMNIPAQUE IOHEXOL 300 MG/ML SOLN  administered into the gastric lumen and proximal small bowel FLUOROSCOPY TIME:  22 minutes, 24 seconds (3622mGy) COMPLICATIONS: None. PROCEDURE: Informed written consent was obtained from the patient after a discussion of the risks and benefits. The upper abdomen and the external portion of the existing gastrostomy tube was prepped and draped in the usual sterile fashion, and a sterile drape was applied covering the operative field. Maximum barrier sterile technique with sterile gowns and gloves were used for the procedure. A timeout was performed prior to the initiation of the procedure. The gastrostomy tube was injected with a small amount of contrast demonstrating significant distension the stomach which is noted to be filled with a large amount of debris. Ultimately with prolonged diligent effort, a stiff glidewire was advanced through the subtotal occlusion of the gastric antrum with the use of a short Kumpe catheter. Under imaged fluoroscopic guidance, the Kumpe catheter was exchanged for a coaxial 9-French gastrojejunostomy catheter which was advanced over the stiff Glidewire with end ultimately co terminating within the proximal jejunum. Contrast injection demonstrated appropriate positioning and functionality of both the gastric and jejunal lumens. Given the significant amount of debris within the stomach as well as patient's presenting complaint of excessive pericatheter leakage, an 18 gauge nasogastric 2 was advanced through the left narrowings to the level of the stomach under intermittent fluoroscopic guidance after the left naris was anesthetized with viscous lidocaine. Postprocedural spot fluoroscopic images were obtained and the procedure was terminated. The nasogastric tube was affixed at the nose with retention tape. Dressings were applied. The patient tolerated procedure well without immediate postprocedural complication. IMPRESSION: 1. Successful fluoroscopic guided conversion of pull-through  gastrostomy tube to a gastrojejunostomy catheter with jejunal limb terminating within the proximal jejunum. 2. Successful fluoroscopic guided placement of an 153French nasogastric tube for the purposes of maximum gastric venting. PLAN: - Given the large amount of retained debris within the stomach, recommended the nasogastric tube stay in place for 1-2 days connected to low wall suction to allow for optimal gastric venting. - The jejunal lumen is ready for immediate use for feeding and the gastric lumen may be utilized for venting purposes as indicated. 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 Electronically Signed   By: JSandi MariscalM.D.   On: 04/24/2020 16:35   DG ABDOMEN PEG TUBE LOCATION  Result Date: 05/05/2020 CLINICAL DATA:  Leaking around GMagnoliatube EXAM: ABDOMEN - 1 VIEW COMPARISON:  04/25/2020 FINDINGS: Gastrojejunostomy tube is in place. Contrast was injected through the gastrostomy portion of the of the tube which fills the stomach. No evidence of contrast extravasation or leak. The jejunostomy portion of the catheter is in the proximal jejunum. No evidence of bowel obstruction. Visualized lung bases clear.  IMPRESSION: Gastrojejunostomy appears to be in expected and appropriate position. Injected contrast is in the stomach. Electronically Signed   By: Rolm Baptise M.D.   On: 05/05/2020 17:53   DG CHEST PORT 1 VIEW  Result Date: 04/27/2020 CLINICAL DATA:  Shortness of breath this morning. EXAM: PORTABLE CHEST 1 VIEW COMPARISON:  May 28, 2010 and CT of the chest from November of 2021. Plain film of April 25, 2020 the abdomen. FINDINGS: Gastric tube courses below the carina into the upper abdomen and as expected, side port likely below GE junction, near the level of the EG junction. RIGHT-sided Port-A-Cath at the caval to atrial junction extending into upper RIGHT atrium.  Heart size stable enlarged. RIGHT lower lobe airspace disease partially obscures the RIGHT hemidiaphragm. Streaky opacities at the LEFT lung base over the LEFT hemidiaphragm. On limited assessment no acute skeletal process. IMPRESSION: 1. RIGHT lower lobe airspace disease suspicious for pneumonia potentially associated with small effusion. 2. Suspected LEFT lower lobe atelectasis versus early infection in this area. 3. Cardiomegaly. 4. Patient's gastrojejunostomy tube is not well seen on the current study. Electronically Signed   By: Zetta Bills M.D.   On: 04/27/2020 11:15   DG ABD ACUTE 2+V W 1V CHEST  Result Date: 04/24/2020 CLINICAL DATA:  Abdominal pain EXAM: DG ABDOMEN ACUTE WITH 1 VIEW CHEST COMPARISON:  Chest CT February 17, 2020; abdominal radiograph February 15, 2020 FINDINGS: AP chest: Port-A-Cath tip is at the cavoatrial junction. No pneumothorax. There is mild atelectasis in the right base. Lungs otherwise are clear. Heart size and pulmonary vascularity are normal. No adenopathy. There is aortic atherosclerosis. Supine and left lateral decubitus abdomen: Gastrostomy catheter in region of stomach. No bowel dilatation or air-fluid level to suggest bowel obstruction. No free air. There are surgical clips in right upper quadrant. IMPRESSION: Gastrostomy catheter in region of stomach. No bowel dilatation or air-fluid level to suggest bowel obstruction. No free air. Surgical clips right upper quadrant. Slight right base atelectasis. No edema or airspace opacity. Port-A-Cath tip in superior vena cava. Aortic Atherosclerosis (ICD10-I70.0). Electronically Signed   By: Lowella Grip III M.D.   On: 04/24/2020 10:15   CT MAXILLOFACIAL WO CONTRAST  Result Date: 04/27/2020 CLINICAL DATA:  TMJ pain or limited movement. Bilateral parotid swelling. EXAM: CT MAXILLOFACIAL WITHOUT CONTRAST TECHNIQUE: Multidetector CT imaging of the maxillofacial structures was performed. Multiplanar CT image reconstructions  were also generated. COMPARISON:  None. FINDINGS: Osseous: Negative for fracture. No acute skeletal abnormality. TMJ normal bilaterally Orbits: Bilateral cataract extraction.  No orbital mass or edema. Sinuses: Near complete opacification left sphenoid sinus with bony thickening consistent with chronic obstruction. Mild expansion compatible with mucocele. Remaining sinuses clear. Mastoid clear. Soft tissues: Prominent parotid tissue bilaterally right greater than left. No acute edema or mass. No parotid stone. Limited intracranial: Negative IMPRESSION: Prominent parotid bilaterally right greater than left without edema or mass. Left sphenoid sinus mucocele. Normal TMJ bilaterally Electronically Signed   By: Franchot Gallo M.D.   On: 04/27/2020 12:27       Subjective: Feeling well.  Some nausea this morning, improved with draining her stomach via gastrostomy tube.  No confusion, fever.  No dyspnea, swelling.  Discharge Exam: Vitals:   05/10/20 2058 05/11/20 0603  BP: (!) 178/82 (!) 145/60  Pulse: 82 88  Resp: 17 17  Temp: 98.5 F (36.9 C) 98.5 F (36.9 C)  SpO2: 99% 99%   Vitals:   05/10/20 0629 05/10/20 1323 05/10/20 2058 05/11/20 0603  BP: [name])  156/75 (!) 158/60 (!) 178/82 (!) 145/60  Pulse: 86 73 82 88  Resp: 18 17 17 17   Temp: 97.8 F (36.6 C) (!) 97.5 F (36.4 C) 98.5 F (36.9 C) 98.5 F (36.9 C)  TempSrc: Oral Oral Oral Oral  SpO2: 100%  99% 99%  Weight:      Height:        General: Pt is alert, awake, not in acute distress Cardiovascular: RRR, nl S1-S2, no murmurs appreciated.   No LE edema.   Respiratory: Normal respiratory rate and rhythm.  CTAB without rales or wheezes. Abdominal: Abdomen soft and non-tender.  No distension or HSM.  No leakage around gastrostomy tube.   Neuro/Psych: Strength symmetric in upper and lower extremities.  Judgment and insight appear normal.   The results of significant diagnostics from this hospitalization (including imaging,  microbiology, ancillary and laboratory) are listed below for reference.     Microbiology: Recent Results (from the past 240 hour(s))  SARS Coronavirus 2 by RT PCR (hospital order, performed in [facility] hospital lab) Nasopharyngeal Nasopharyngeal Swab     Status: None   Collection Time: 05/05/20 10:05 PM   Specimen: Nasopharyngeal Swab  Result Value Ref Range Status   SARS Coronavirus 2 NEGATIVE NEGATIVE Final    Comment: (NOTE) SARS-CoV-2 target nucleic acids are NOT DETECTED.  The SARS-CoV-2 RNA is generally detectable in upper and lower respiratory specimens during the acute phase of infection. The lowest concentration of SARS-CoV-2 viral copies this assay can detect is 250 copies / mL. A negative result does not preclude SARS-CoV-2 infection and should not be used as the sole basis for treatment or other patient management decisions.  A negative result may occur with improper specimen collection / handling, submission of specimen other than nasopharyngeal swab, presence of viral mutation(s) within the areas targeted by this assay, and inadequate number of viral copies (<250 copies / mL). A negative result must be combined with clinical observations, patient history, and epidemiological information.  Fact Sheet for Patients:   StrictlyIdeas.no  Fact Sheet for Healthcare Providers: BankingDealers.co.za  This test is not yet approved or  cleared by the [place] FDA and has been authorized for detection and/or diagnosis of SARS-CoV-2 by FDA under an Emergency Use Authorization (EUA).  This EUA will remain in effect (meaning this test can be used) for the duration of the COVID-19 declaration under Section 564(b)(1) of the Act, 21 U.S.C. section 360bbb-3(b)(1), unless the authorization is terminated or revoked sooner.  Performed at [facility], [place] [address] [place], [place] [address]      Labs: BNP  (last 3 results) No results for input(s): BNP in the last 8760 hours. Basic Metabolic Panel: Recent Labs  Lab 05/05/20 1756 05/06/20 0530 05/07/20 0417 05/08/20 0316 05/09/20 0903 05/10/20 0306 05/11/20 0316  NA 140   < > 139 139 139 139 137  K 3.9   < > 3.1* 3.4* 3.3* 2.9* 3.3*  CL 106   < > 108 109 108 107 106  CO2 24   < > 25 25 24 24 24   GLUCOSE 107*   < > 129* 124* 111* 119* 117*  BUN 18   < > 15 11 8  7* 6*  CREATININE 0.58   < > 0.50 0.51 0.48 0.52 0.51  CALCIUM 8.8*   < > 8.2* 8.0* 8.2* 8.0* 7.8*  MG 2.1  --  1.9  --   --   --   --   PHOS  --   --  2.4*  --   --   --   --    < > = values in this interval not displayed.   Liver Function Tests: Recent Labs  Lab 05/06/20 0530  AST 47*  ALT 53*  ALKPHOS 169*  BILITOT 0.4  PROT 4.6*  ALBUMIN 2.0*   No results for input(s): LIPASE, AMYLASE in the last 168 hours. No results for input(s): AMMONIA in the last 168 hours. CBC: Recent Labs  Lab 05/05/20 1756 05/05/20 2021 05/06/20 0750 05/06/20 1620 05/08/20 0316 05/10/20 0306 05/11/20 0316  WBC 5.6   < > 4.4 4.6 4.2 4.6 3.9*  NEUTROABS 4.7  --   --   --   --   --   --   HGB 8.9*   < > 8.6* 8.7* 8.2* 8.6* 7.7*  HCT 27.0*   < > 26.2* 26.9* 25.5* 26.7* 24.2*  MCV 93.8   < > 94.2 95.4 96.6 95.7 96.4  PLT 102*   < > 95* 108* 101* 117* 107*   < > = values in this interval not displayed.   Cardiac Enzymes: No results for input(s): CKTOTAL, CKMB, CKMBINDEX, TROPONINI in the last 168 hours. BNP: Invalid input(s): POCBNP CBG: Recent Labs  Lab 05/10/20 1624 05/10/20 2010 05/11/20 0013 05/11/20 0405 05/11/20 0731  GLUCAP 127* 114* 107* 103* 105*   D-Dimer No results for input(s): DDIMER in the last 72 hours. Hgb A1c No results for input(s): HGBA1C in the last 72 hours. Lipid Profile No results for input(s): CHOL, HDL, LDLCALC, TRIG, CHOLHDL, LDLDIRECT in the last 72 hours. Thyroid function studies No results for input(s): TSH, T4TOTAL, T3FREE, THYROIDAB in  the last 72 hours.  Invalid input(s): FREET3 Anemia work up Recent Labs    05/09/20 0903  VITAMINB12 1,054*  FOLATE 4.4*  FERRITIN 99  TIBC 164*  IRON 44  RETICCTPCT 4.7*   Urinalysis    Component Value Date/Time   COLORURINE AMBER (A) 02/14/2020 1112   APPEARANCEUR HAZY (A) 02/14/2020 1112   LABSPEC 1.027 02/14/2020 1112   PHURINE 5.0 02/14/2020 1112   GLUCOSEU NEGATIVE 02/14/2020 1112   HGBUR NEGATIVE 02/14/2020 1112   BILIRUBINUR NEGATIVE 02/14/2020 1112   BILIRUBINUR negative 01/04/2013 1530   KETONESUR 5 (A) 02/14/2020 1112   PROTEINUR 100 (A) 02/14/2020 1112   UROBILINOGEN 0.2 10/09/2013 2037   NITRITE NEGATIVE 02/14/2020 1112   LEUKOCYTESUR NEGATIVE 02/14/2020 1112   Sepsis Labs Invalid input(s): PROCALCITONIN,  WBC,  LACTICIDVEN Microbiology Recent Results (from the past 240 hour(s))  SARS Coronavirus 2 by RT PCR (hospital order, performed in [place] hospital lab) Nasopharyngeal Nasopharyngeal Swab     Status: None   Collection Time: 05/05/20 10:05 PM   Specimen: Nasopharyngeal Swab  Result Value Ref Range Status   SARS Coronavirus 2 NEGATIVE NEGATIVE Final    Comment: (NOTE) SARS-CoV-2 target nucleic acids are NOT DETECTED.  The SARS-CoV-2 RNA is generally detectable in upper and lower respiratory specimens during the acute phase of infection. The lowest concentration of SARS-CoV-2 viral copies this assay can detect is 250 copies / mL. A negative result does not preclude SARS-CoV-2 infection and should not be used as the sole basis for treatment or other patient management decisions.  A negative result may occur with improper specimen collection / handling, submission of specimen other than nasopharyngeal swab, presence of viral mutation(s) within the areas targeted by this assay, and inadequate number of viral copies (<250 copies / mL). A negative result must be combined with clinical  observations, patient history, and epidemiological  information.  Fact Sheet for Patients:   StrictlyIdeas.no  Fact Sheet for Healthcare Providers: BankingDealers.co.za  This test is not yet approved or  cleared by the [place] FDA and has been authorized for detection and/or diagnosis of SARS-CoV-2 by FDA under an Emergency Use Authorization (EUA).  This EUA will remain in effect (meaning this test can be used) for the duration of the COVID-19 declaration under Section 564(b)(1) of the Act, 21 U.S.C. section 360bbb-3(b)(1), unless the authorization is terminated or revoked sooner.  Performed at [facility], [place] [address] [place], [place] [address]      Time coordinating discharge: 90 minutes The [place] controlled substances registry was reviewed for this patient       SIGNED:   Edwin Dada, MD  [facility] 05/11/2020, 3:29 PM

## 2020-05-16 ENCOUNTER — Other Ambulatory Visit: Payer: Self-pay | Admitting: Hematology

## 2020-05-17 ENCOUNTER — Telehealth: Payer: Self-pay

## 2020-05-17 ENCOUNTER — Encounter: Payer: Self-pay | Admitting: Nutrition

## 2020-05-17 ENCOUNTER — Other Ambulatory Visit: Payer: Self-pay

## 2020-05-17 DIAGNOSIS — C221 Intrahepatic bile duct carcinoma: Secondary | ICD-10-CM

## 2020-05-17 NOTE — Progress Notes (Signed)
Nutrition consult received from [facility] to assist with home tube feeding.  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 Patient noted to have recent hospital admission January 28 through February 3 and was sent home with hospice. She presented due to malfunction of GJ tube. She is s/p upsizing of gastrostomy tube with dual port (gastic venting port; jejunal port) gastrostomy tube in place.  Patient allowed to taste food for comfort and vent her gastric port after tasting food.  Tube feeds and free water must be put through the jejunal port. She is having clinical signs of gastric outlet obstruction and symptoms with any oral or gastric intake.  Spoke with patient's daughter over the telephone.  She reports patient complains of increased hunger and wanting to eat.  She has consumed apple juice and several bottles of boost.  She has not been able to consistently tolerate Osmolite 1.2 for more than 15-30 minutes at a time.  Reports diarrhea after Osmolite 1.2 starts.  If tube feeding to continue, recommend Osmolite 1.2 at 10 mL an hour via the jejunal port. Increase 10 mL daily if tolerated to goal rate of 45 mL an hour to provide 1296 cal and 60 g protein.  Continue 100 mL free water every 4 hours.  Total free water is approximately 1485 mL. Encourage patient to decrease volume of oral intake.  Be sure to vent gastric tube after oral intake.   **Disclaimer: This note was dictated with voice recognition software. Similar sounding words can inadvertently be transcribed and this note may contain transcription errors which may not have been corrected upon publication of note.**

## 2020-05-17 NOTE — Telephone Encounter (Signed)
I called back and spoke with pt's daughter [name]. She reports when she gives tube feeds through the J-tube especially at rate 40-26m/hr, she noticed the leakage around tube insertion site. She is still able to give medicine through J-tube and flush saline without difficulty. No bleeding, skin erythema or other new symptoms. Pt does drink some liquid, and iceacream at small amount, and the G-tube for venting is working. Pt is hungry, wants to eat, and not ready to stop eating or tube feeds. I contacted IR, spoke with APP KLennette Bihari  I also [facility]. She said she will.   YTruitt MerleMD

## 2020-05-17 NOTE — Telephone Encounter (Signed)
Maurine Minister from [facility] hospice called callback number [phone]. She states that Ms Althaus is not tolerating her tube feeds.  She states the patient and family appear to have unrealistic expectations regarding patient status.  She is requesting Dr Burr Medico speak with Ms Rudell daughter [name].

## 2020-05-24 ENCOUNTER — Other Ambulatory Visit: Payer: Self-pay

## 2020-05-24 ENCOUNTER — Ambulatory Visit (HOSPITAL_COMMUNITY)
Admission: RE | Admit: 2020-05-24 | Discharge: 2020-05-24 | Disposition: A | Discharge status: Home / Self Care | Payer: Medicare HMO | Source: Ambulatory Visit | Attending: Hematology | Admitting: Hematology

## 2020-05-24 ENCOUNTER — Other Ambulatory Visit: Payer: Self-pay | Admitting: Hematology

## 2020-05-24 DIAGNOSIS — K9413 Enterostomy malfunction: Secondary | ICD-10-CM | POA: Diagnosis not present

## 2020-05-24 DIAGNOSIS — Z743 Need for continuous supervision: Secondary | ICD-10-CM | POA: Diagnosis not present

## 2020-05-24 DIAGNOSIS — R5381 Other malaise: Secondary | ICD-10-CM | POA: Diagnosis not present

## 2020-05-24 DIAGNOSIS — C221 Intrahepatic bile duct carcinoma: Secondary | ICD-10-CM

## 2020-05-24 DIAGNOSIS — R279 Unspecified lack of coordination: Secondary | ICD-10-CM | POA: Diagnosis not present

## 2020-05-24 NOTE — Progress Notes (Signed)
Chief Complaint: Patient was seen in consultation today for leak GJ tube  Referring Physician(s): Feng,Yan  Patient Status: [facility] - Out-pt  History of Present Illness: Bonnie Peterson is a 75 y.o. female with history of metastatic cholangiocarcinoma, gastric outlet obstruction, and chronic nausea and vomiting.  Venting G tube was placed on 02/21/2020, followed by conversion to 18 fr GJ tube on 04/24/2020.  She has difficulty with leakage around the tube insertion since initial placement.  She and her daughter was very frustrated as nothing seems to help with the leakage. The [place] tube was upsized to 26 fr on 05/10/2020, however there is continued leakage around the tube.    Per her daughter, the feeds and medication are administered through the J limb.  However the patient becomes bloated very quickly with any amount of liquid feeds despite administering very slowly.  Her daughter also reports that there is leakage around the catheter insertion with administration of feed through the J limb.    The patient is eating small amounts by mouth.  The G tube is vented while at home.   Her daughter reports high outputs from the G tube.  Past Medical History:  Diagnosis Date  . Anxiety   . Benign positional vertigo   . Cancer ([place]) 02/09/15   intrahepatic cholangiocarcinoma  . COPD (chronic obstructive pulmonary disease) ([place])   . Depression   . Diabetes mellitus without complication ([place])   . Early cataracts, bilateral 10/13   Optho, Dr Gershon Crane  . Echocardiogram findings abnormal, without diagnosis 10/10   10/10: mild pulm HTN, EF 60-65%, mild LVH, moderate aortic regurg  . GERD (gastroesophageal reflux disease)    I have "acid reflux"  . Gout   . HLD (hyperlipidemia)   . HTN, goal below 130/80   . Irritable bowel syndrome   . Obesity, Class III, BMI 40-49.9 (morbid obesity) ([place])   . Obstructive sleep apnea    wears cpap  . Osteoarthritis (arthritis due to wear and tear of joints)     also gout  . Restrictive lung disease     Past Surgical History:  Procedure Laterality Date  . ABDOMINAL HYSTERECTOMY    . BIOPSY  02/18/2020   Procedure: BIOPSY;  Surgeon: Thornton Park, MD;  Location: [facility];  Service: Gastroenterology;;  . COLONOSCOPY WITH PROPOFOL N/A 08/12/2019   Procedure: COLONOSCOPY WITH PROPOFOL;  Surgeon: [name], MD;  Location: [facility];  Service: Endoscopy;  Laterality: N/A;  . ESOPHAGOGASTRODUODENOSCOPY N/A 06/15/2015   Procedure: ESOPHAGOGASTRODUODENOSCOPY (EGD);  Surgeon: Gatha Mayer, MD;  Location: [facility];  Service: Endoscopy;  Laterality: N/A;  . ESOPHAGOGASTRODUODENOSCOPY (EGD) WITH PROPOFOL N/A 02/18/2020   Procedure: ESOPHAGOGASTRODUODENOSCOPY (EGD) WITH PROPOFOL;  Surgeon: Thornton Park, MD;  Location: [facility];  Service: Gastroenterology;  Laterality: N/A;  . ESOPHAGOGASTRODUODENOSCOPY (EGD) WITH PROPOFOL N/A 05/09/2020   Procedure: ESOPHAGOGASTRODUODENOSCOPY (EGD) WITH PROPOFOL;  Surgeon: Mauri Pole, MD;  Location: [facility];  Service: Endoscopy;  Laterality: N/A;  . FOREIGN BODY REMOVAL  05/09/2020   Procedure: FOREIGN BODY REMOVAL;  Surgeon: Mauri Pole, MD;  Location: [facility];  Service: Endoscopy;;  . IR GASTR TUBE CONVERT GASTR-JEJ PER W/FL MOD SED  02/21/2020  . IR GASTR TUBE CONVERT GASTR-JEJ PER W/FL MOD SED  04/24/2020  . IR GASTROSTOMY TUBE MOD SED  02/21/2020  . IR GJ TUBE CHANGE  05/10/2020  . IR IMAGING GUIDED PORT INSERTION  06/15/2019  . IR INTRO LONG GI TUBE  04/24/2020  .  IR REMOVAL TUN ACCESS W/ PORT W/O FL MOD SED  11/11/2016  . LIVER BIOPSY    . OPEN PARTIAL HEPATECTOMY  Left 02/09/15  . PARTIAL HYSTERECTOMY    . ROTATOR CUFF REPAIR       L rotator cuff repair 11/07-[place] - 12/7/200  . TONSILLECTOMY      Allergies: Propofol  Medications: Prior to Admission medications   Medication Sig Start Date End Date Taking? Authorizing Provider  albuterol (PROAIR HFA) 108 (90 Base)  MCG/ACT inhaler Inhale 1-2 puffs into the lungs every 6 (six) hours as needed for wheezing or shortness of breath. 07/23/19   Parrett, Fonnie Mu, NP  dicyclomine (BENTYL) 20 MG tablet 1 tablet (20 mg total) by Per J Tube route 2 (two) times daily as needed for spasms (abdominal pain). 05/11/20   Danford, Suann Larry, MD  famotidine (PEPCID) 20 MG tablet Place 1 tablet (20 mg total) into feeding tube 2 (two) times daily as needed for heartburn. 05/11/20   Danford, Suann Larry, MD  fluticasone (FLONASE) 50 MCG/ACT nasal spray Place 2 sprays into both nostrils daily as needed for rhinitis. 04/17/16   Haney, Amedeo Plenty, MD  HYDROmorphone (DILAUDID) 2 MG tablet Place 1 tablet (2 mg total) into feeding tube every 4 (four) hours as needed for severe pain. 05/11/20   Edwin Dada, MD  lisinopril (ZESTRIL) 40 MG tablet TAKE 1 TABLET BY MOUTH EVERY DAY 05/16/20   Truitt Merle, MD  LORazepam (ATIVAN) 0.5 MG tablet Take 0.5 mg by mouth every 8 (eight) hours as needed for anxiety.    [provider]  Nutritional Supplements (FEEDING SUPPLEMENT, OSMOLITE 1.5 CAL,) LIQD Give Osmolite 1.5 at 45 ml/hr via J-tube 05/11/20   Danford, Suann Larry, MD  pantoprazole sodium (PROTONIX) 40 mg/20 mL PACK Place 10 mLs (20 mg total) into feeding tube daily as needed (For heart burn or indigestion). 05/11/20 06/10/20  Edwin Dada, MD  Water For Irrigation, Sterile (FREE WATER) SOLN Place 100 mLs into feeding tube every 4 (four) hours. 04/30/20   Mariel Aloe, MD     Family History  Problem Relation Age of Onset  . Breast cancer Mother   . Hypertension Mother   . Coronary artery disease Mother   . Diabetes type II Mother   . Rheum arthritis Mother   . Diabetes type II Sister   . Pancreatic cancer Sister     Social History   Socioeconomic History  . Marital status: Divorced    Spouse name: Not on file  . Number of children: 4  . Years of education: Not on file  . Highest education level: Not on file   Occupational History  . Occupation: unemployed  Tobacco Use  . Smoking status: Former Smoker    Packs/day: 0.50    Years: 49.00    Pack years: 24.50    Types: Cigarettes    Quit date: 04/13/2018    Years since quitting: 2.1  . Smokeless tobacco: Never Used  Vaping Use  . Vaping Use: Never used  Substance and Sexual Activity  . Alcohol use: Yes    Alcohol/week: 0.0 standard drinks    Comment: occasionally  . Drug use: Yes    Types: Marijuana  . Sexual activity: Never  Other Topics Concern  . 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  All other systems are negative.  Review of Systems  Gastrointestinal: Positive for abdominal distention, abdominal pain, diarrhea, nausea and vomiting.    Vital Signs: There were no vitals taken for this visit.  Physical Exam  Abdomen is soft, NTND.  The dressing is mildly saturated with liquid feed.  The erythema around the drain insertion site.  Imaging: DG Abd 1 View  Result Date: 04/25/2020 CLINICAL DATA:  Abdominal distention. EXAM: ABDOMEN - 1 VIEW COMPARISON:  IR images 04/24/2020.  Abdomen 04/24/2020 FINDINGS: Interim placement of NG tube, its tip is over the stomach. Gastrojejunostomy catheter in stable position. Surgical clips right upper quadrant. Oral contrast noted what appears to be colon. No bowel distention.  Degenerative change lumbar spine. IMPRESSION: 1. Interim placement of NG tube, its tip is over the stomach. Gastrojejunostomy catheter in stable position. 2. Oral contrast noted in what appears to be colon. No bowel distention. Electronically Signed   By: Marcello Moores  Register   On: 04/25/2020 06:30   IR GJ Tube Change  Result Date: 05/11/2020 INDICATION: 75 year old female with history of intrahepatic cholangiocarcinoma and gastric outlet obstruction requiring percutaneous enteric access for nutritional purposes status post percutaneous gastrostomy tube placement on 02/21/2020 and jejunal arm placement on 04/24/2020. Since conversion to gastrojejunostomy, the patient complains of leaking and pain at the tube insertion site as well as fullness with feeding. EXAM: JEJUNAL CATHETER REPLACEMENT MEDICATIONS: None ANESTHESIA/SEDATION: Fentanyl 50 mcg IV Moderate Sedation Time:  0 minutes The patient was continuously monitored during the procedure by the interventional radiology nurse under my direct supervision. CONTRAST:  Ten mL Omnipaque 300-administered into the gastric lumen. FLUOROSCOPY TIME:  Fluoroscopy Time: 0 minutes 30 seconds (7 mGy). COMPLICATIONS: None immediate. PROCEDURE: Informed written consent was obtained from the patient after a thorough discussion of the procedural risks, benefits and alternatives. All questions were addressed. Maximal Sterile Barrier Technique was utilized including caps, mask, sterile gowns, sterile gloves, sterile drape, hand hygiene and skin antiseptic. A timeout was performed prior to the initiation of the procedure. The indwelling pull-through gastrostomy tube with indwelling jejunal arm appeared within normal limits externally. Scout radiograph prior to procedure demonstrated appropriate positioning. There was minimal, cloudy, opaque appearing drainage from around the tube insertion site with a dermatotomy significantly larger than the indwelling tube. Hand injection contrast  the jejunal and gastric arms demonstrated appropriate positioning within the stomach and proximal jejunum. No leakage of contrast along the indwelling tube to the skin surface was observed. Decision was made to upsize the indwelling tube in hopes of decreasing the amount of leakage around the tube insertion site. An exchange length stiff glidewire was inserted through the jejunal arm. The indwelling tube was removed without difficulty. Over the wire, a 54 French gastrojejunostomy tube was inserted. The tip was positioned in the proximal jejunum, approximately 20 cm beyond the ligament of Treitz. The retention balloon was inflated with 10 mL sterile water. Hand injection of contrast via the gastric and jejunal  lumen confirmed appropriate positioning. The external skin bumper was cinched skin surface. A bandage was applied. The patient tolerated the procedure well was transferred back to the floor in good condition. IMPRESSION: Successful upsize of indwelling gastrojejunostomy from 77 French 26 [place], now balloon retention. The jejunal limb is positioned in the proximal jejunum. Okay to resume tube feeds and medication administration immediately as needed. Ruthann Cancer, MD [facility] Electronically Signed   By: Ruthann Cancer MD   On: 05/11/2020 07:53   DG ABDOMEN PEG TUBE LOCATION  Result Date: 05/05/2020 CLINICAL DATA:  Leaking around [place] tube EXAM: ABDOMEN - 1 VIEW COMPARISON:  04/25/2020 FINDINGS: Gastrojejunostomy tube is in place. Contrast was injected through the gastrostomy portion of the of the tube which fills the stomach. No evidence of contrast extravasation or leak. The jejunostomy portion of the catheter is in the proximal jejunum. No evidence of bowel obstruction. Visualized lung bases clear. IMPRESSION: Gastrojejunostomy appears to be in expected and appropriate position. Injected contrast is in the stomach. Electronically Signed   By:  Rolm Baptise M.D.   On: 05/05/2020 17:53   DG CHEST PORT 1 VIEW  Result Date: 04/27/2020 CLINICAL DATA:  Shortness of breath this morning. EXAM: PORTABLE CHEST 1 VIEW COMPARISON:  May 28, 2010 and CT of the chest from November of 2021. Plain film of April 25, 2020 the abdomen. FINDINGS: Gastric tube courses below the carina into the upper abdomen and as expected, side port likely below GE junction, near the level of the EG junction. RIGHT-sided Port-A-Cath at the caval to atrial junction extending into upper RIGHT atrium. Heart size stable enlarged. RIGHT lower lobe airspace disease partially obscures the RIGHT hemidiaphragm. Streaky opacities at the LEFT lung base over the LEFT hemidiaphragm. On limited assessment no acute skeletal process. IMPRESSION: 1. RIGHT lower lobe airspace disease suspicious for pneumonia potentially associated with small effusion. 2. Suspected LEFT lower lobe atelectasis versus early infection in this area. 3. Cardiomegaly. 4. Patient's gastrojejunostomy tube is not well seen on the current study. Electronically Signed   By: Zetta Bills M.D.   On: 04/27/2020 11:15   CT MAXILLOFACIAL WO CONTRAST  Result Date: 04/27/2020 CLINICAL DATA:  TMJ pain or limited movement. Bilateral parotid swelling. EXAM: CT MAXILLOFACIAL WITHOUT CONTRAST TECHNIQUE: Multidetector CT imaging of the maxillofacial structures was performed. Multiplanar CT image reconstructions were also generated. COMPARISON:  None. FINDINGS: Osseous: Negative for fracture. No acute skeletal abnormality. TMJ normal bilaterally Orbits: Bilateral cataract extraction.  No orbital mass or edema. Sinuses: Near complete opacification left sphenoid sinus with bony thickening consistent with chronic obstruction. Mild expansion compatible with mucocele. Remaining sinuses clear. Mastoid clear. Soft tissues: Prominent parotid tissue bilaterally right greater than left. No acute edema or mass. No parotid stone. Limited  intracranial: Negative IMPRESSION: Prominent parotid bilaterally right greater than left without edema or mass. Left sphenoid sinus mucocele. Normal TMJ bilaterally Electronically Signed   By: Franchot Gallo M.D.   On: 04/27/2020 12:27    Labs:  CBC: Recent Labs    05/06/20 1620 05/08/20 0316 05/10/20 0306 05/11/20 0316  WBC 4.6 4.2 4.6 3.9*  HGB 8.7* 8.2* 8.6* 7.7*  HCT 26.9* 25.5* 26.7* 24.2*  PLT 108* 101* 117* 107*    COAGS: Recent Labs    06/15/19 1323 01/11/20 0605 04/24/20 0932  INR 1.0 1.1 1.1  APTT  --   --  27    BMP: Recent Labs    11/24/19 1231 01/06/20 1328  01/11/20 0549 01/14/20 1100 02/25/20 1545 04/24/20 0932 05/08/20 0316 05/09/20 0903 05/10/20 0306 05/11/20 0316  NA 139 138 139   < > 138   < > 139 139 139 137  K 3.7 3.3* 3.7   < > 4.6   < > 3.4* 3.3* 2.9* 3.3*  CL 106 100 100   < > 101   < > 109 108 107 106  CO2 25 29 21*   < > 23   < > 25 24 24 24   GLUCOSE 138* 108* 165*   < > 121*   < > 124* 111* 119* 117*  BUN 10 7* 11   < > 10   < > 11 8 7* 6*  CALCIUM 9.8 9.0 9.5   < > 9.0   < > 8.0* 8.2* 8.0* 7.8*  CREATININE 0.75 0.65 0.70   < > 0.65   < > 0.51 0.48 0.52 0.51  GFRNONAA >60 >60 >60   < > 88   < > >60 >60 >60 >60  GFRAA >60 >60 >60  --  102  --   --   --   --   --    < > = values in this interval not displayed.    LIVER FUNCTION TESTS: Recent Labs    02/20/20 0522 02/23/20 0504 04/24/20 0932 05/06/20 0530  BILITOT 0.5 0.6 1.0 0.4  AST 26 32 53* 47*  ALT 17 16 58* 53*  ALKPHOS 68 75 129* 169*  PROT 5.7* 5.3* 5.9* 4.6*  ALBUMIN 2.4* 2.3* 2.8* 2.0*    TUMOR MARKERS: No results for input(s): AFPTM, CEA, CA199, CHROMGRNA in the last 8760 hours.  Assessment and Plan:  75 y.o. female with history of metastatic cholangiocarcinoma, gastric outlet obstruction, and chronic nausea and vomiting.   02/21/2020 - Venting G tube placed 04/24/2020 - Converted to 18 GJ tube 05/10/2020 - upsized to 26 fr GJ tube due to leakage around  insertion site  Despite upsize, there is continuous leakage of contents around the insertion site.    I went over the structure of the [place] tube with the patient's daughter.  I explained that its very unlikely that the J limb feeds are traveling retrograde to the stomach and leaking around the insertion site, particularly given the gastric outlet obstruction.  Its more likely that the leaking material is related to gastric excretions and the small amount of oral intake.    The bloating related to the J limb feeds is likely due to delayed transit through the small bowel.  On examination, there was significant slack between the outer disc and the balloon in the gastric lumen.  I pulled the balloon back to the anterior gastric wall and pushed the dsic against the skin of the anterior abdominal wall.  I then added tape and a zip tie just above the disc so that it could not slide back.    I asked her daughter to let us know if there was still significant leakage around the insertion site.  If there is continued leakage, we can consider suturing the disc to the skin and/or adding a purse string around the insertion site.  Thank you for this interesting consult.  I greatly enjoyed meeting Bonnie Peterson and look forward to participating in their care.  A copy of this report was sent to the requesting provider on this date.  Electronically Signed: Paula Libra Gustavia Carie, MD 05/24/2020, 4:19 PM   I spent a total of  25  Minutes in face to face in clinical consultation, greater than 50% of which was counseling/coordinating care for leaking GJ tube.

## 2020-06-02 ENCOUNTER — Telehealth: Payer: Self-pay

## 2020-06-02 DIAGNOSIS — C221 Intrahepatic bile duct carcinoma: Secondary | ICD-10-CM

## 2020-06-02 NOTE — Telephone Encounter (Signed)
Bonnie Mylar, Ms Heisler daughter called stating that there is leakage around the [place] tube again.

## 2020-06-06 DIAGNOSIS — R0902 Hypoxemia: Secondary | ICD-10-CM | POA: Diagnosis not present

## 2020-06-06 DIAGNOSIS — R402421 Glasgow coma scale score 9-12, in the field [EMT or ambulance]: Secondary | ICD-10-CM | POA: Diagnosis not present

## 2020-06-06 DIAGNOSIS — R404 Transient alteration of awareness: Secondary | ICD-10-CM | POA: Diagnosis not present

## 2020-06-07 ENCOUNTER — Telehealth: Payer: Self-pay

## 2020-06-07 NOTE — Telephone Encounter (Signed)
We were notified today by Bonnie Peterson from [place] that Ms Milbourn has been transferred to [facility].

## 2020-06-27 ENCOUNTER — Other Ambulatory Visit: Payer: Self-pay | Admitting: Family Medicine

## 2020-06-27 ENCOUNTER — Telehealth: Payer: Self-pay | Admitting: Family Medicine

## 2020-06-27 NOTE — Telephone Encounter (Signed)
Called pt to see how she is doing on the 2 telephone numbers provided. No answer. Left VM.

## 2020-07-07 DEATH — deceased

## 2021-03-02 IMAGING — DX DG ABD PORTABLE 1V
1 series · 1 of 1 positions shown · non-contrast
Comparison: Portable exam 9985 hours compared to 01/14/2020

CLINICAL DATA: Cholangiocarcinoma, nausea, vomiting

EXAM:
PORTABLE ABDOMEN - 1 VIEW

[abdomen kub]
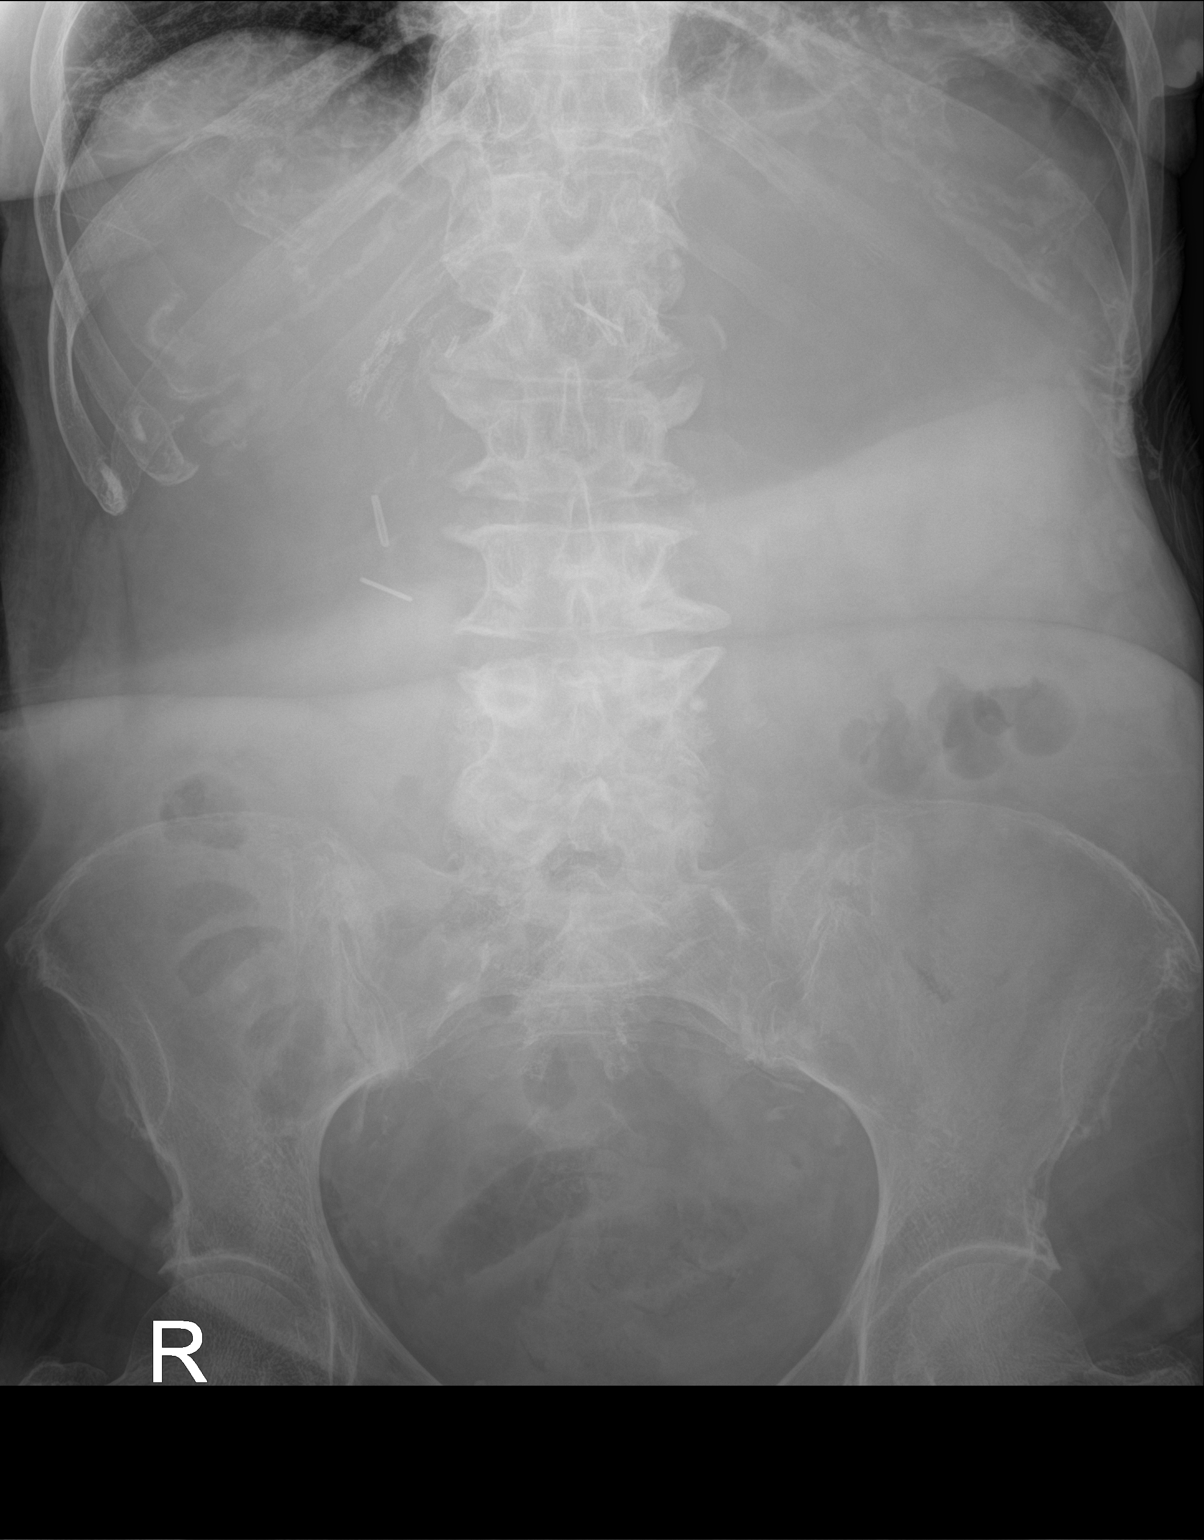

[1 of 1 positions shown; findings below may reference images not displayed]

FINDINGS: Nonobstructive bowel gas pattern.

Paucity of bowel gas in the upper abdomen.

No bowel wall thickening or dilatation.

Degenerative disc disease changes lumbar spine.

Surgical clips in upper to mid abdomen.
IMPRESSION: No acute abnormalities.

## 2021-03-04 IMAGING — CT CT CHEST W/ CM
2 of 5 series · 11 of 36 positions shown, 13 images · IV contrast (omnipaque)
Comparison: CT abdomen pelvis 01/14/2020 and 01/11/2020, CT chest
07/29/2017

CLINICAL DATA: Intrahepatic cholangiocarcinoma. Restaging
examination.

EXAM:
CT CHEST, ABDOMEN, AND PELVIS WITH CONTRAST
TECHNIQUE: Multidetector CT imaging of the chest, abdomen and pelvis was
performed following the standard protocol during bolus
administration of intravenous contrast.
CONTRAST:  100mL OMNIPAQUE IOHEXOL 300 MG/ML  SOLN

[Series 2: cap with · axial · 0.79mm/px · z∈[+878,+1358]mm · 8 of 120 slices shown, 10 images]
[im 12/120  mediastinal]
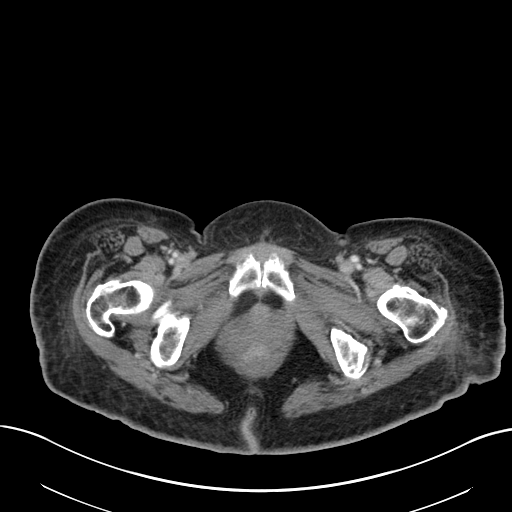
[im 12/120  lung]
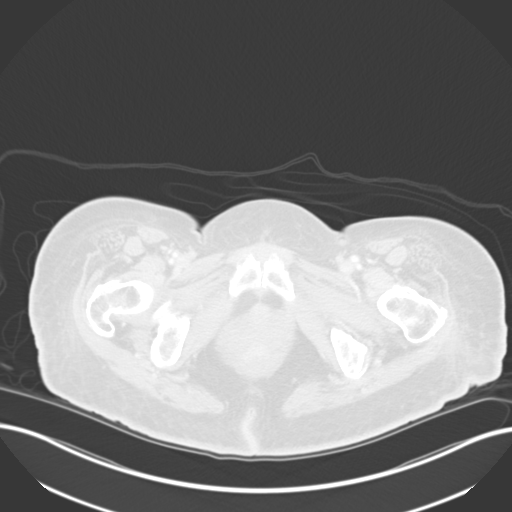
[im 24/120  lung]
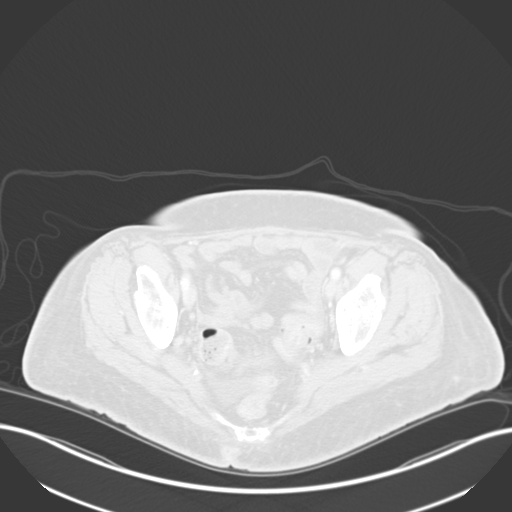
[im 36/120  lung]
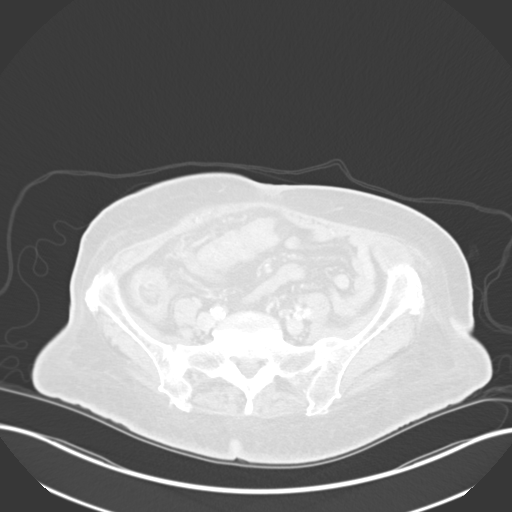
[im 48/120  lung]
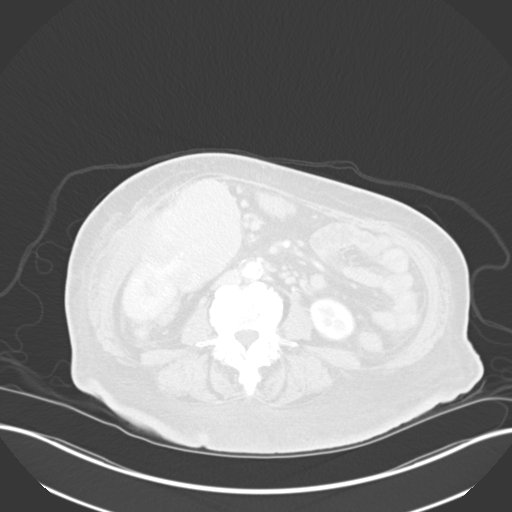
[im 72/120  mediastinal]
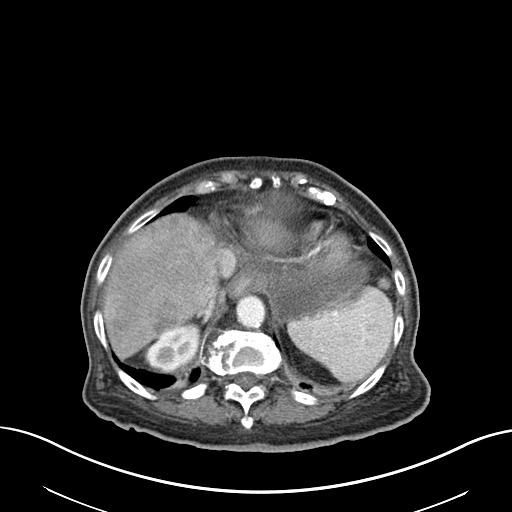
[im 72/120  lung]
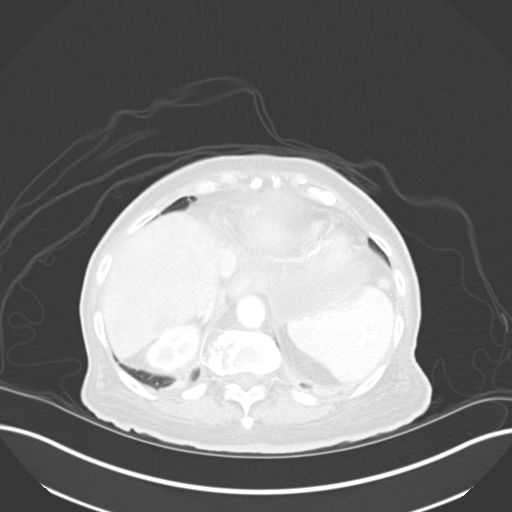
[im 84/120  lung]
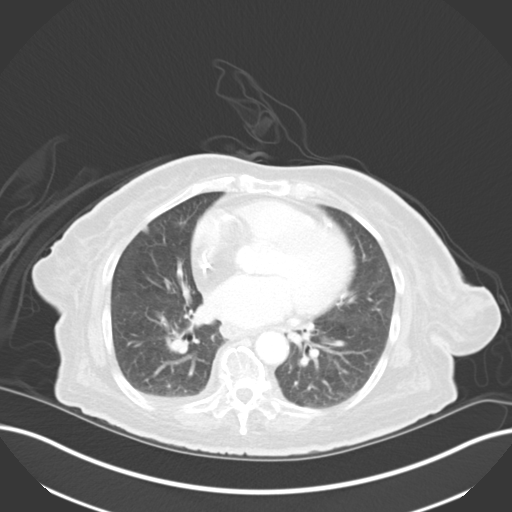
[im 96/120  lung]
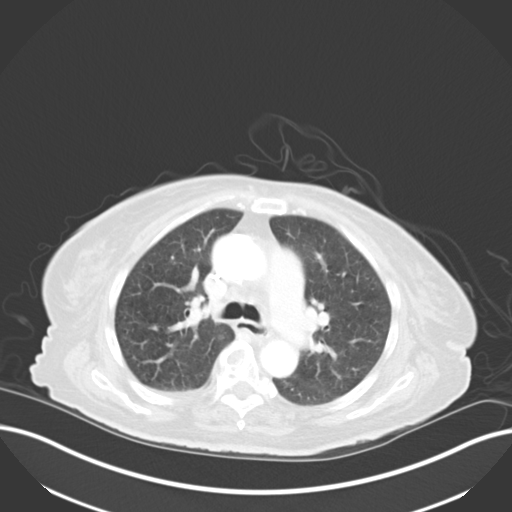
[im 108/120  lung]
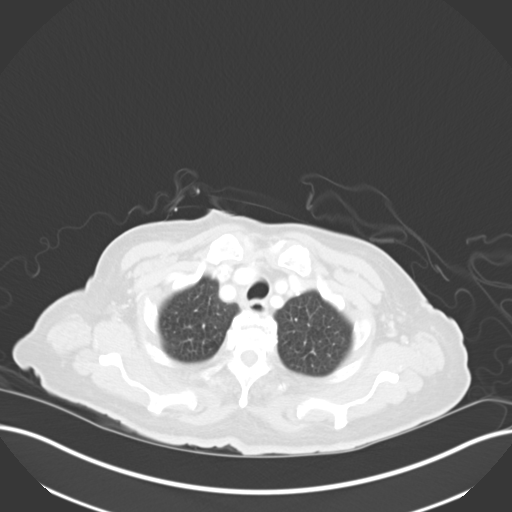

[Series 5: coronals · coronal · 0.70mm/px · 3 of 131 slices shown]
[im 27/131  lung]
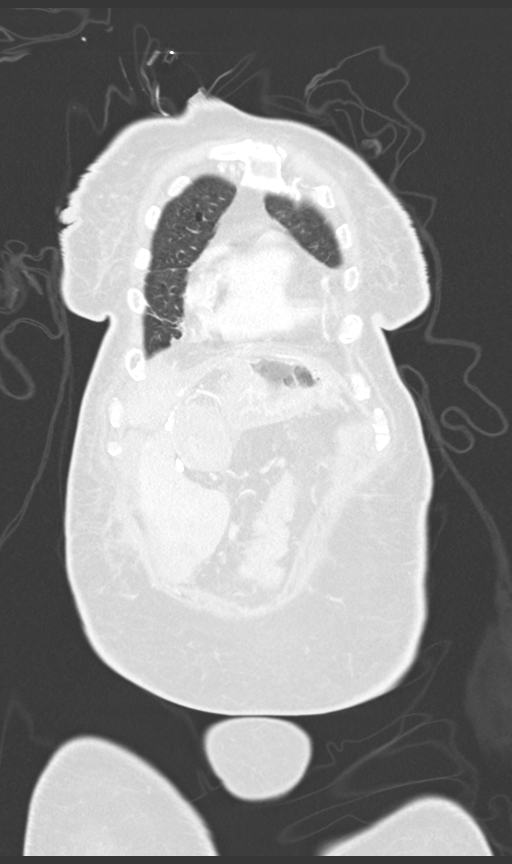
[im 53/131  lung]
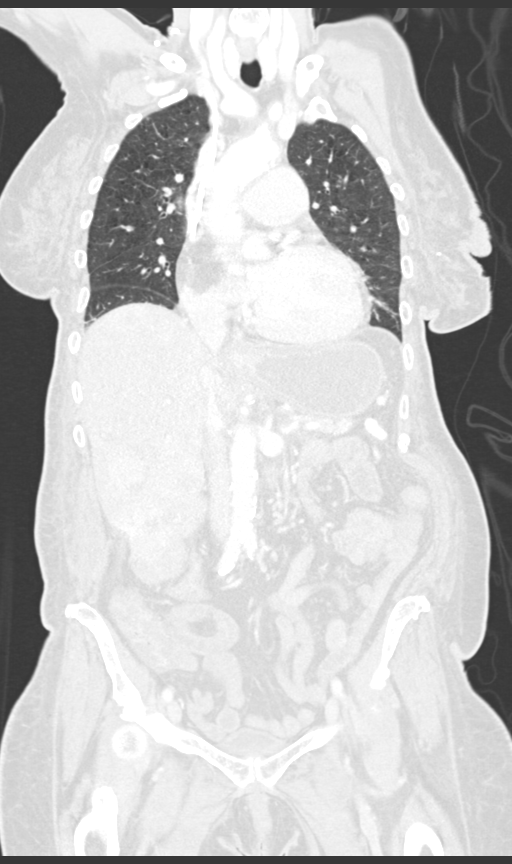
[im 79/131  lung]
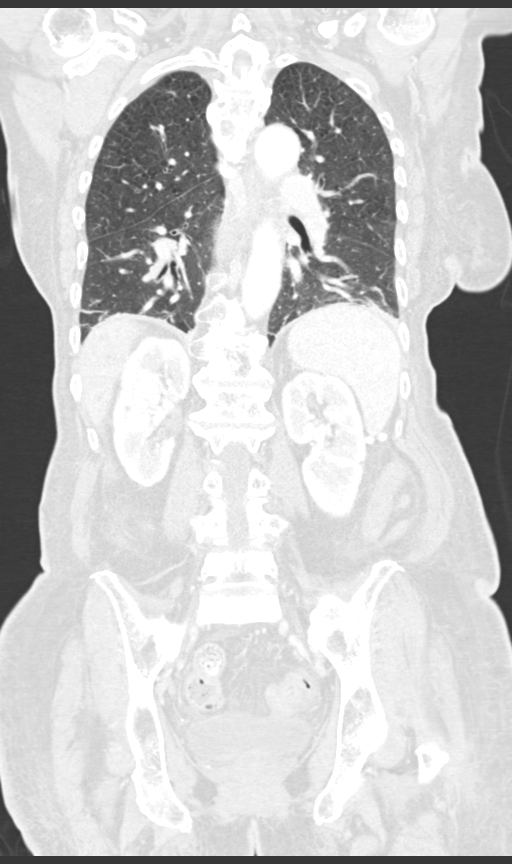

[11 of 36 positions shown; findings below may reference images not displayed]

FINDINGS: CT CHEST FINDINGS

Cardiovascular: Moderate multi-vessel coronary artery calcification.
Global cardiac size within normal limits. Lipomatous hypertrophy of
the intra-atrial septum noted. Mild thinning of the left ventricular
apex suggests remote myocardial infarction or chronic ischemia in
this location. No pericardial effusion. The central pulmonary
arteries are enlarged suggesting changes of pulmonary arterial
hypertension. Extensive atherosclerotic calcification noted within
the thoracic aorta. The thoracic aorta is mildly dilated measuring
4.0 cm in greatest dimension within the ascending segment and 3.2 cm
in greatest dimension within the arch. The thoracic aorta
demonstrates a more normal caliber at the level of the left atrium
measuring 2.5 cm in dimension. Right internal jugular chest port
catheter tip noted within the superior cavoatrial junction.

Mediastinum/Nodes: Thyroid unremarkable. No pathologic thoracic
adenopathy. Mild circumferential thickening of the distal esophageal
wall may reflect changes of mild esophagitis, as can be seen with
reflux esophagitis. The esophagus is otherwise unremarkable.

Lungs/Pleura: Mild emphysema again noted. Bibasilar atelectasis is
noted. 5 mm subpleural pulmonary nodule within the right middle
lobe, axial image # 90/4, is stable since remote prior examination
and is safely considered benign given its stability over time. No
new focal pulmonary nodule or infiltrate. No pneumothorax or pleural
effusion. Central airways are widely patent.

Musculoskeletal: Osseous structures are diffusely osteopenic.
Advanced degenerative changes are seen within the thoracic spine.
Flowing osteophytes throughout the thoracic spine are in keeping
with changes of diffuse idiopathic skeletal hyperostosis. No focal
lytic or blastic bone lesions are identified.

CT ABDOMEN PELVIS FINDINGS

Hepatobiliary: Surgical changes of left hepatectomy are again
identified. Multiple enhancing masses are again seen scattered
throughout the residual right hepatic lobe in keeping with recurrent
cholangiocarcinoma. Exact measurement is difficult as many of these
variably enhancing masses form irregular conglomerates, however,
these appear grossly stable since prior examination. By example:

Conglomerate within the superior right hepatic lobe, axial image #
55/2, measures 6.1 x 6.5 cm (previously measuring 6.0 x 7.6 cm).

Conglomerate within the inferior right hepatic lobe, axial image #
74/2, measures 4.5 x 7.0 cm (previously measuring 4.3 x 7.4 cm).

Focal enhancing mass within the right hepatic lobe, axial image #
70/2, measures 2.6 x 3.2 cm (previously measuring 2.5 x 2.8 cm).

No intrahepatic biliary ductal dilation. Status post
cholecystectomy. The extrahepatic bile duct is not dilated.
Thrombosis of the portal vein is again identified

Pancreas: Unremarkable

Spleen: Unremarkable

Adrenals/Urinary Tract: Adrenal glands are unremarkable. Kidneys are
normal, without renal calculi, focal lesion, or hydronephrosis.
Bladder is unremarkable.

Stomach/Bowel: There is circumferential mural thickening and edema
involving the distal body of the stomach and antrum, similar to that
noted a on prior examination, possibly reflecting changes of portal
gastropathy given chronic occlusion of the portal vein.
Alternatively, this can be seen in caustic or infectious gastritis.
No evidence of obstruction or perforation. Multiple pedunculated
lipomas are again seen within the colon measuring 14 mm within the
distal transverse colon and 3.0 cm within the sigmoid colon. The
small and large bowel are otherwise unremarkable. The appendix is
unremarkable. Trace ascites is present, similar to prior
examination. No free intraperitoneal gas.

Vascular/Lymphatic: Extensive aortoiliac atherosclerotic
calcification. No aortic aneurysm. Shotty, enhancing left periaortic
lymphadenopathy is stable, measuring 11 mm on axial image # 69/2. No
frankly pathologic adenopathy within the abdomen and pelvis.

Reproductive: Status post hysterectomy. No adnexal masses.

Other: Rectum unremarkable

Musculoskeletal: Advanced degenerative changes are seen within the
lumbar spine. No lytic or blastic bone lesions are seen.
IMPRESSION: Stable disease. Status post left hepatectomy with multiple recurrent
conglomerate enhancing masses within the residual right hepatic
lobe. No intrahepatic biliary ductal dilation. Malignant masses
replace roughly 50% of the liver parenchyma.

Chronic thrombosis of the portal vein. Stable thickening and edema
of the distal body of the stomach and gastric antrum possibly
representing changes of portal gastropathy. Note that caustic or
infectious gastritis could appear similarly and correlation with
endoscopy may be helpful for definitive evaluation.

No evidence of pulmonary metastatic disease.

Moderate multi-vessel coronary artery calcification.

Mild dilation of the thoracic aorta. Further follow-up is dependent
upon the patient's prognosis related to their underlying malignancy.
This can be reassessed on subsequent surveillance imaging for the
patient's primary malignancy.

Aortic Atherosclerosis (AMAQC-8GH.H) and Emphysema (AMAQC-BJZ.D).
Aortic aneurysm NOS (AMAQC-4EQ.M).

## 2021-03-08 IMAGING — XA IR CONVERT G-TUBE TO G-JTUBE
2 series · 13 of 14 positions shown · IV contrast (IODINE)
Comparison: CT the chest, abdomen pelvis-02/17/2020

INDICATION: History of metastatic cholangiocarcinoma now with persistent nausea
and vomiting concern for malignant gastric outlet obstruction.

Request made for placement of gastrojejunostomy tube for venting and
feeding purposes.
EXAM:
PULL TROUGH GASTROSTOMY TUBE PLACEMENT

[Series 5: care body 4 · 2 acquisitions, 7 frames shown]
[im 1/2]
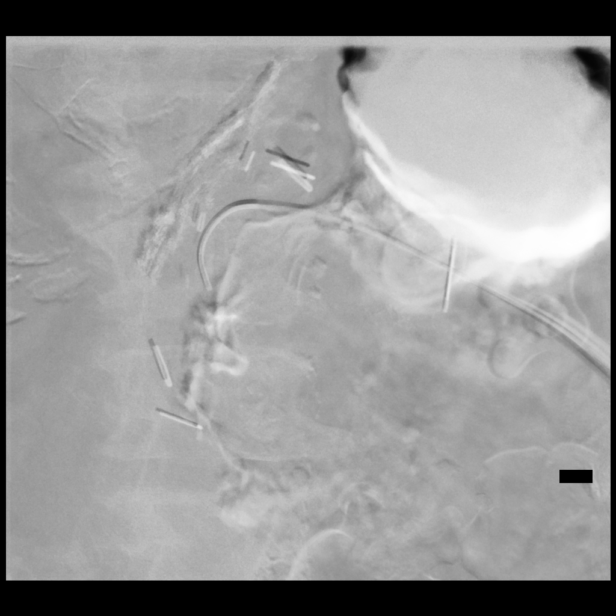
[im 1/2]
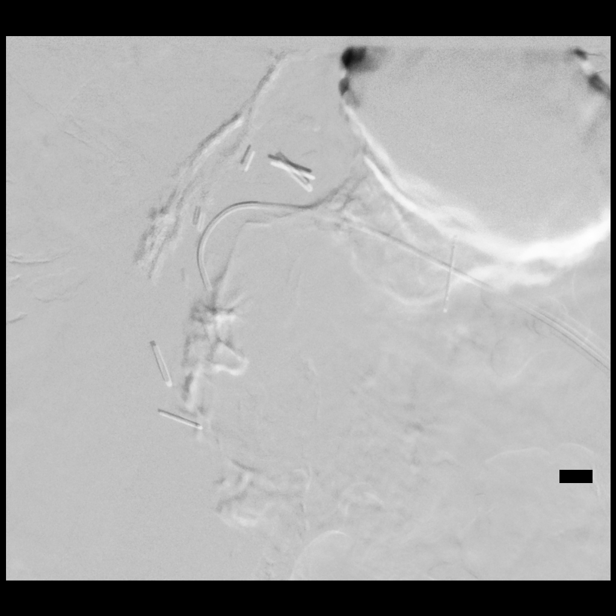
[im 1/2]
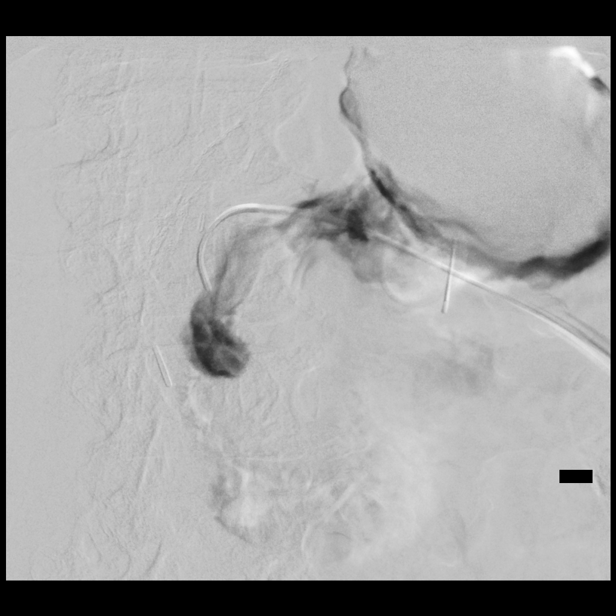
[im 1/2]
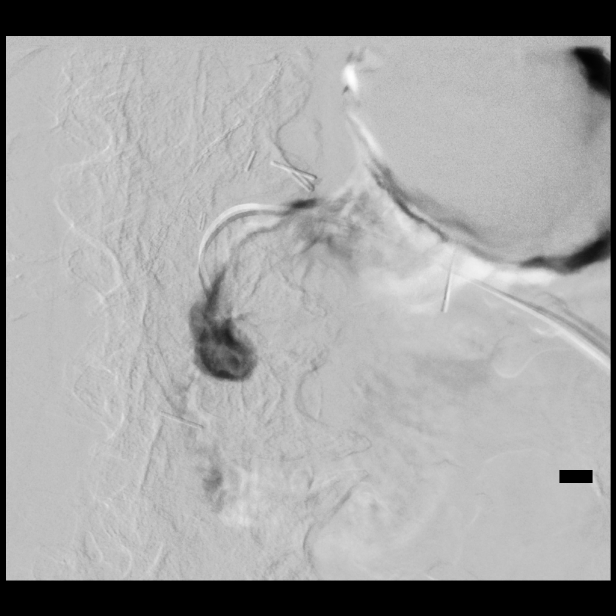
[im 2/2]
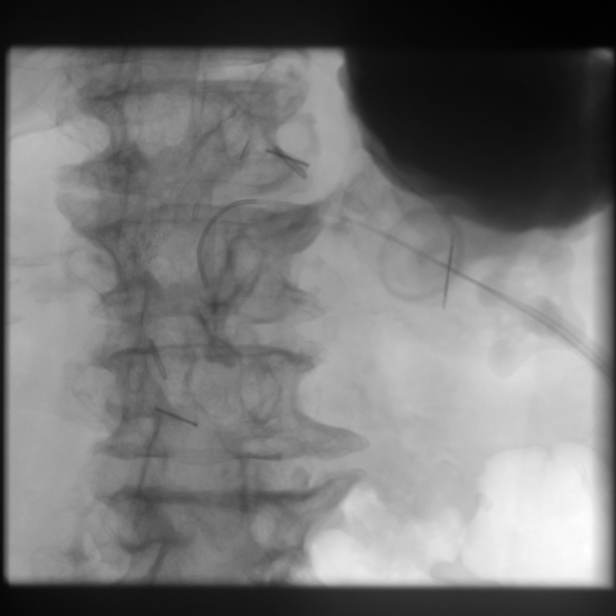
[im 2/2]
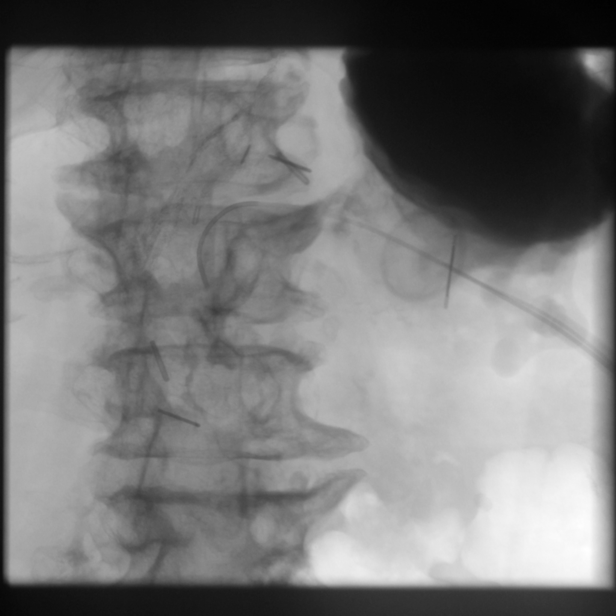
[im 2/2]
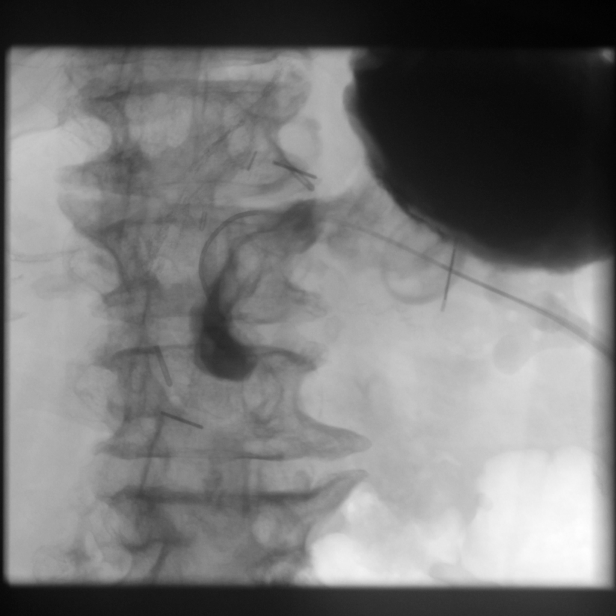

[Series 300: ir gastr tube convert gastr-jej per w/fl · 6 of 6 slices shown]
[im 1/6]
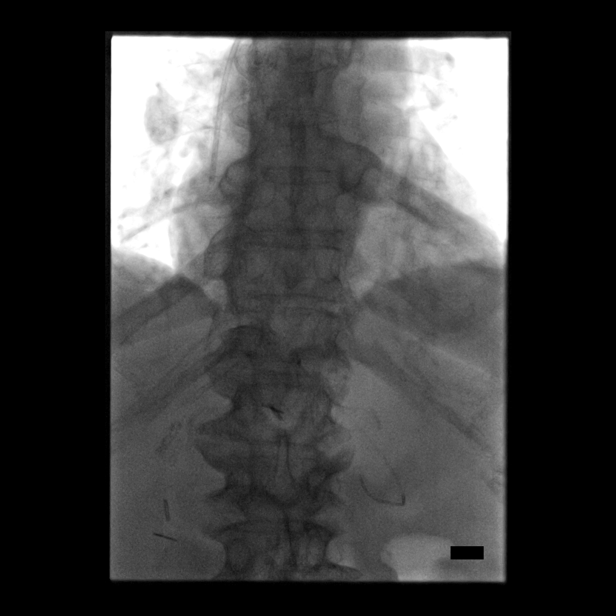
[im 2/6]
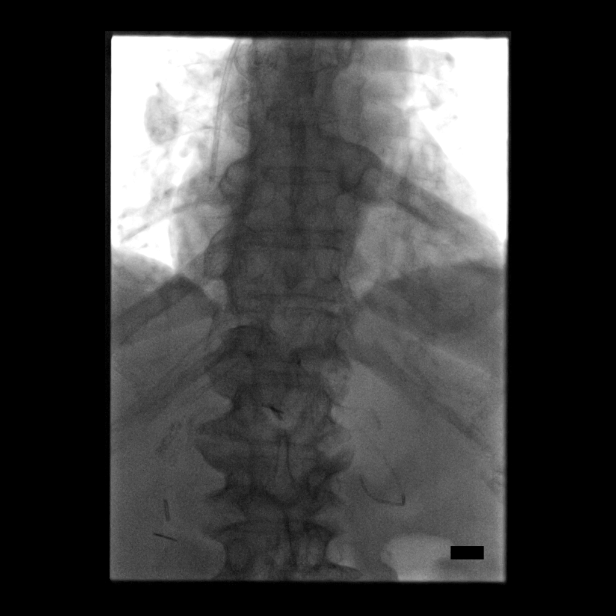
[im 3/6]
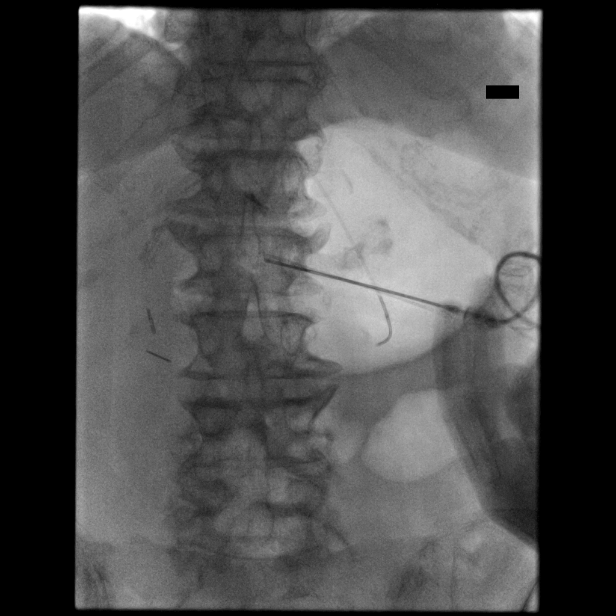
[im 4/6]
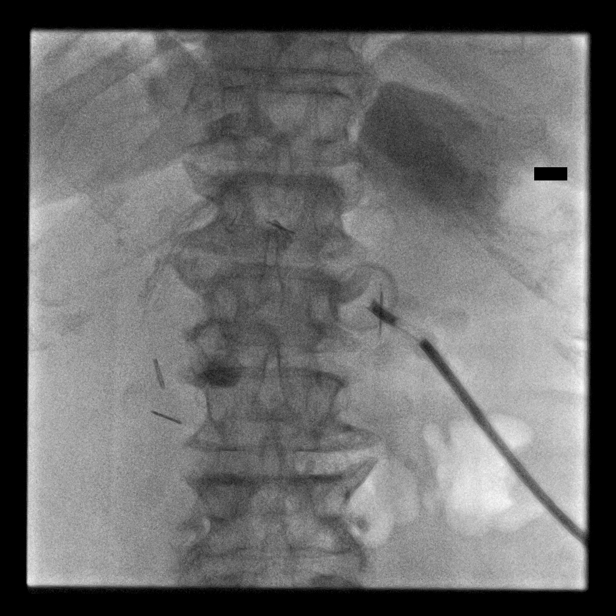
[im 5/6]
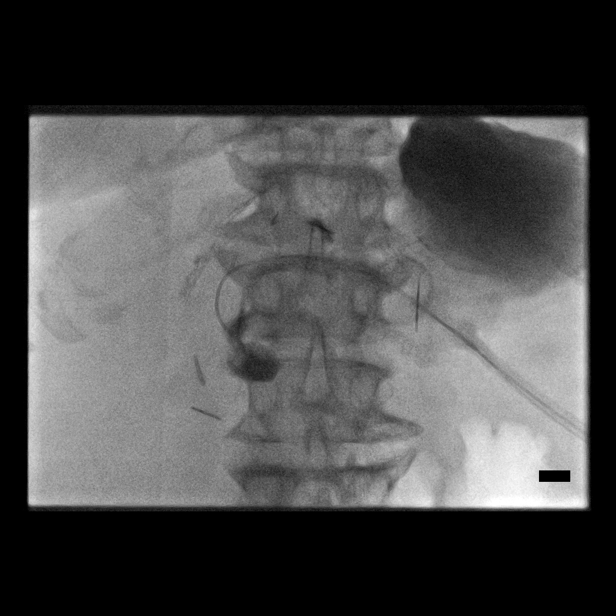
[im 6/6]
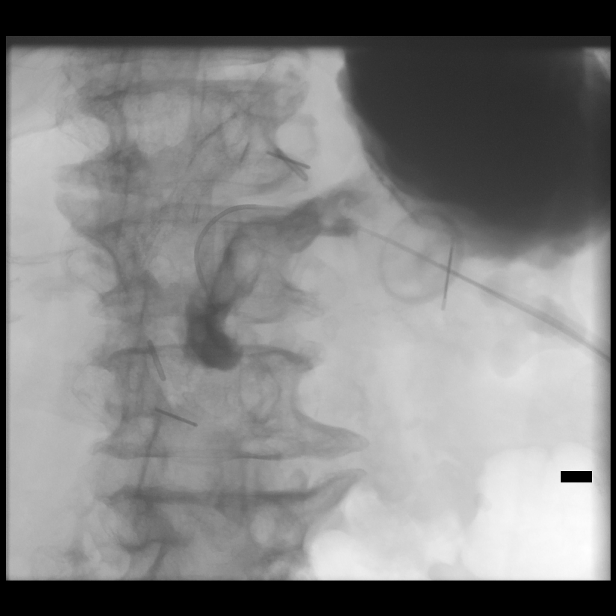

[13 of 14 positions shown; findings below may reference images not displayed]

MEDICATIONS:
Ancef 2 gm IV; Antibiotics were administered within 1 hour of the
procedure.

Phenergan 6.25 mg IV

CONTRAST:  70 mL of Omnipaque 300 administered into the gastric
lumen.

ANESTHESIA/SEDATION:
Moderate (conscious) sedation was employed during this procedure. A
total of Versed 2 mg and Fentanyl 50 mcg was administered
intravenously.

Moderate Sedation Time: 36 minutes. The patient's level of
consciousness and vital signs were monitored continuously by
radiology nursing throughout the procedure under my direct
supervision.

FLUOROSCOPY TIME:  17 minutes, 42 seconds (4 in 8 mGy)

COMPLICATIONS:
None immediate.

PROCEDURE:
Informed written consent was obtained from the patient following
explanation of the procedure, risks, benefits and alternatives. A
time out was performed prior to the initiation of the procedure.
Ultrasound scanning was performed to demarcate the edge of the
liver. Maximal barrier sterile technique utilized including caps,
mask, sterile gowns, sterile gloves, large sterile drape, hand
hygiene and Betadine prep.

The left upper quadrant was sterilely prepped and draped. An oral
gastric catheter was inserted into the stomach under fluoroscopy.
The existing nasogastric feeding tube was removed. The left costal
margin and air opacified transverse colon were identified and
avoided. Air was injected into the stomach for insufflation and
visualization under fluoroscopy. Under sterile conditions a 17 gauge
trocar needle was utilized to access the stomach percutaneously
beneath the left subcostal margin after the overlying soft tissues
were anesthetized with 1% Lidocaine with epinephrine. Note, access
was directed towards the gastric antrum given plan to attempt
placement of a jejunostomy lumen.

Needle position was confirmed within the stomach with aspiration of
air and injection of small amount of contrast. A single T tack was
deployed for gastropexy. Over an Amplatz guide wire, a 9-French
sheath was inserted into the stomach. A snare device was utilized to
capture the oral gastric catheter. The snare device was pulled
retrograde from the stomach up the esophagus and out the oropharynx.
The 20-French pull-through gastrostomy was connected to the snare
device and pulled antegrade through the oropharynx down the
esophagus into the stomach and then through the percutaneous tract
external to the patient.

Contrast was injected further appropriate position functionality.

Next, the external portion of the gastrostomy tube was cannulated
with initially a Kumpe catheter and ultimately a C2 catheter and
despite prolonged efforts, a wire could not be advanced through the
gastric antrum to the level of the proximal duodenum.

As such the decision was made to terminate the procedure.

The gastrostomy was assembled externally. Contrast injection
confirms position in the stomach. Several spot radiographic images
were obtained in various obliquities for documentation. The patient
tolerated procedure well without immediate post procedural
complication.
FINDINGS: After successful fluoroscopic guided placement, the gastrostomy tube
is appropriately positioned with internal disc against the ventral
aspect of the gastric lumen.

Despite prolonged efforts, a wire could not be advanced through the
gastric antrum presumably secondary to malignant obstruction.
IMPRESSION: Successful fluoroscopic insertion of a 20-French pull-through
gastrostomy tube. The gastrostomy may be used immediately for
venting purposes.

PLAN:
Patient may return for repeat attempt at conversion of gastrostomy
tube to a GJ tube later this week as indicated. If this repeat
attempt is also unsuccessful, consideration could be made for
endoscopic conversion as indicated.

## 2021-05-21 IMAGING — CR DG ABDOMEN 1V
1 series · 1 of 1 positions shown · non-contrast
Comparison: 04/25/2020

CLINICAL DATA: Leaking around GJ tube

EXAM:
ABDOMEN - 1 VIEW

[x abdomen supine]
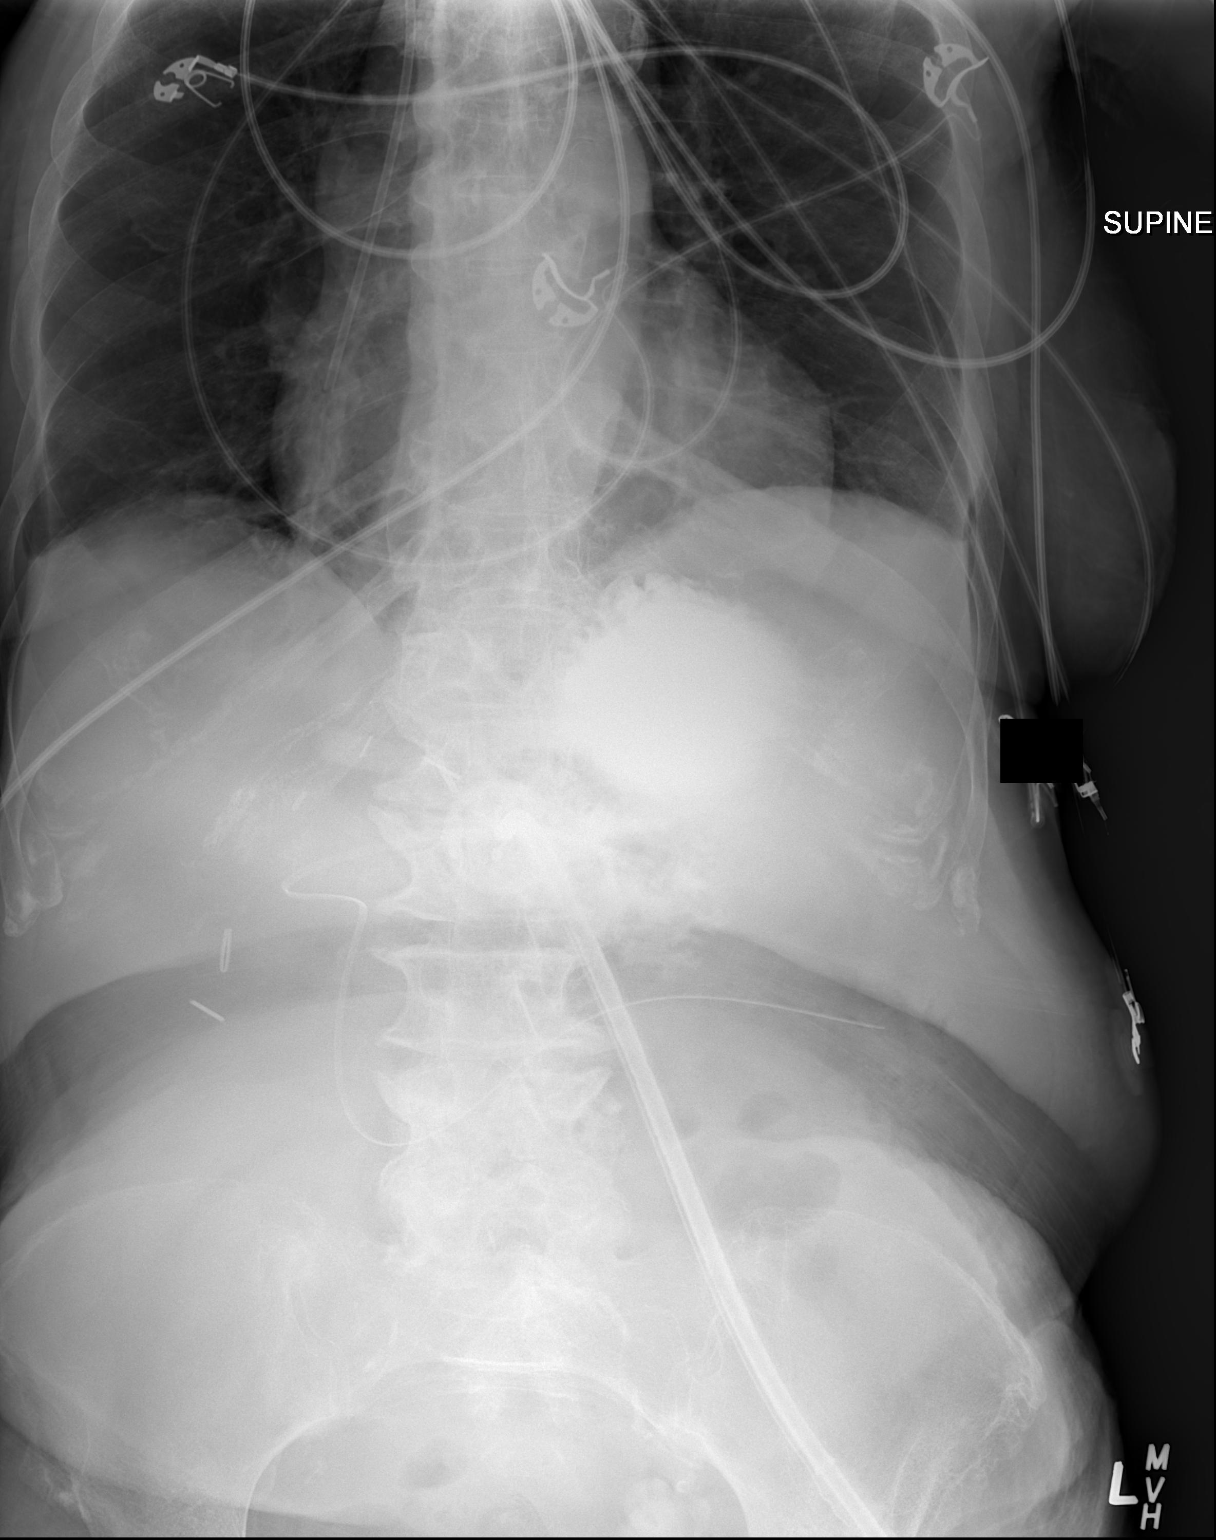

[1 of 1 positions shown; findings below may reference images not displayed]

FINDINGS: Gastrojejunostomy tube is in place. Contrast was injected through
the gastrostomy portion of the of the tube which fills the stomach.
No evidence of contrast extravasation or leak. The jejunostomy
portion of the catheter is in the proximal jejunum. No evidence of
bowel obstruction. Visualized lung bases clear.
IMPRESSION: Gastrojejunostomy appears to be in expected and appropriate
position. Injected contrast is in the stomach.
